# Patient Record
Sex: Male | Born: 1945 | Race: Black or African American | Hispanic: No | Marital: Married | Smoking: Former smoker
Health system: Southern US, Community
[De-identification: ages and names within clinical notes are randomized; demographics above are authoritative.]

## PROBLEM LIST (undated history)

## (undated) DIAGNOSIS — M542 Cervicalgia: Secondary | ICD-10-CM

## (undated) DIAGNOSIS — J069 Acute upper respiratory infection, unspecified: Secondary | ICD-10-CM

## (undated) DIAGNOSIS — R002 Palpitations: Secondary | ICD-10-CM

## (undated) DIAGNOSIS — R079 Chest pain, unspecified: Secondary | ICD-10-CM

## (undated) DIAGNOSIS — E7800 Pure hypercholesterolemia, unspecified: Secondary | ICD-10-CM

## (undated) DIAGNOSIS — Z86010 Personal history of colonic polyps: Secondary | ICD-10-CM

## (undated) DIAGNOSIS — T7840XA Allergy, unspecified, initial encounter: Secondary | ICD-10-CM

## (undated) DIAGNOSIS — E785 Hyperlipidemia, unspecified: Secondary | ICD-10-CM

## (undated) DIAGNOSIS — R0609 Other forms of dyspnea: Secondary | ICD-10-CM

## (undated) DIAGNOSIS — K222 Esophageal obstruction: Secondary | ICD-10-CM

## (undated) DIAGNOSIS — E739 Lactose intolerance, unspecified: Secondary | ICD-10-CM

## (undated) DIAGNOSIS — F528 Other sexual dysfunction not due to a substance or known physiological condition: Secondary | ICD-10-CM

## (undated) DIAGNOSIS — R51 Headache: Secondary | ICD-10-CM

## (undated) DIAGNOSIS — K219 Gastro-esophageal reflux disease without esophagitis: Secondary | ICD-10-CM

## (undated) DIAGNOSIS — K209 Esophagitis, unspecified: Secondary | ICD-10-CM

## (undated) DIAGNOSIS — E669 Obesity, unspecified: Secondary | ICD-10-CM

## (undated) DIAGNOSIS — I6529 Occlusion and stenosis of unspecified carotid artery: Secondary | ICD-10-CM

## (undated) DIAGNOSIS — I739 Peripheral vascular disease, unspecified: Secondary | ICD-10-CM

## (undated) DIAGNOSIS — M545 Low back pain: Secondary | ICD-10-CM

## (undated) DIAGNOSIS — F411 Generalized anxiety disorder: Secondary | ICD-10-CM

## (undated) DIAGNOSIS — R0989 Other specified symptoms and signs involving the circulatory and respiratory systems: Secondary | ICD-10-CM

## (undated) DIAGNOSIS — K648 Other hemorrhoids: Secondary | ICD-10-CM

## (undated) DIAGNOSIS — M1990 Unspecified osteoarthritis, unspecified site: Secondary | ICD-10-CM

## (undated) DIAGNOSIS — H269 Unspecified cataract: Secondary | ICD-10-CM

## (undated) DIAGNOSIS — R42 Dizziness and giddiness: Secondary | ICD-10-CM

## (undated) DIAGNOSIS — E291 Testicular hypofunction: Secondary | ICD-10-CM

## (undated) DIAGNOSIS — I10 Essential (primary) hypertension: Secondary | ICD-10-CM

## (undated) DIAGNOSIS — R9409 Abnormal results of other function studies of central nervous system: Secondary | ICD-10-CM

## (undated) DIAGNOSIS — F329 Major depressive disorder, single episode, unspecified: Secondary | ICD-10-CM

## (undated) DIAGNOSIS — L301 Dyshidrosis [pompholyx]: Secondary | ICD-10-CM

## (undated) DIAGNOSIS — R7302 Impaired glucose tolerance (oral): Secondary | ICD-10-CM

## (undated) DIAGNOSIS — J309 Allergic rhinitis, unspecified: Secondary | ICD-10-CM

## (undated) DIAGNOSIS — R21 Rash and other nonspecific skin eruption: Secondary | ICD-10-CM

## (undated) DIAGNOSIS — J449 Chronic obstructive pulmonary disease, unspecified: Secondary | ICD-10-CM

## (undated) DIAGNOSIS — H6690 Otitis media, unspecified, unspecified ear: Secondary | ICD-10-CM

## (undated) HISTORY — DX: Allergic rhinitis, unspecified: J30.9

## (undated) HISTORY — DX: Chronic obstructive pulmonary disease, unspecified: J44.9

## (undated) HISTORY — DX: Hyperlipidemia, unspecified: E78.5

## (undated) HISTORY — DX: Other hemorrhoids: K64.8

## (undated) HISTORY — DX: Unspecified osteoarthritis, unspecified site: M19.90

## (undated) HISTORY — DX: Impaired glucose tolerance (oral): R73.02

## (undated) HISTORY — DX: Testicular hypofunction: E29.1

## (undated) HISTORY — DX: Other sexual dysfunction not due to a substance or known physiological condition: F52.8

## (undated) HISTORY — DX: Generalized anxiety disorder: F41.1

## (undated) HISTORY — DX: Cervicalgia: M54.2

## (undated) HISTORY — DX: Chest pain, unspecified: R07.9

## (undated) HISTORY — DX: Major depressive disorder, single episode, unspecified: F32.9

## (undated) HISTORY — DX: Peripheral vascular disease, unspecified: I73.9

## (undated) HISTORY — DX: Dizziness and giddiness: R42

## (undated) HISTORY — DX: Allergy, unspecified, initial encounter: T78.40XA

## (undated) HISTORY — DX: Acute upper respiratory infection, unspecified: J06.9

## (undated) HISTORY — DX: Low back pain: M54.5

## (undated) HISTORY — DX: Lactose intolerance, unspecified: E73.9

## (undated) HISTORY — DX: Essential (primary) hypertension: I10

## (undated) HISTORY — DX: Otitis media, unspecified, unspecified ear: H66.90

## (undated) HISTORY — DX: Headache: R51

## (undated) HISTORY — DX: Pure hypercholesterolemia, unspecified: E78.00

## (undated) HISTORY — DX: Occlusion and stenosis of unspecified carotid artery: I65.29

## (undated) HISTORY — DX: Personal history of colonic polyps: Z86.010

## (undated) HISTORY — DX: Obesity, unspecified: E66.9

## (undated) HISTORY — DX: Unspecified cataract: H26.9

## (undated) HISTORY — DX: Abnormal results of other function studies of central nervous system: R94.09

## (undated) HISTORY — DX: Esophagitis, unspecified: K20.9

## (undated) HISTORY — DX: Rash and other nonspecific skin eruption: R21

## (undated) HISTORY — DX: Other specified symptoms and signs involving the circulatory and respiratory systems: R09.89

## (undated) HISTORY — DX: Dyshidrosis (pompholyx): L30.1

## (undated) HISTORY — PX: UPPER GASTROINTESTINAL ENDOSCOPY: SHX188

## (undated) HISTORY — DX: Other forms of dyspnea: R06.09

## (undated) HISTORY — DX: Palpitations: R00.2

## (undated) HISTORY — DX: Gastro-esophageal reflux disease without esophagitis: K21.9

## (undated) HISTORY — PX: OTHER SURGICAL HISTORY: SHX169

## (undated) HISTORY — DX: Esophageal obstruction: K22.2

---

## 1999-07-27 ENCOUNTER — Emergency Department (HOSPITAL_COMMUNITY): Admission: EM | Admit: 1999-07-27 | Discharge: 1999-07-27 | Payer: Self-pay | Admitting: Emergency Medicine

## 2004-01-01 ENCOUNTER — Emergency Department (HOSPITAL_COMMUNITY): Admission: EM | Admit: 2004-01-01 | Discharge: 2004-01-01 | Payer: Self-pay | Admitting: Emergency Medicine

## 2004-07-30 ENCOUNTER — Ambulatory Visit: Payer: Self-pay | Admitting: Internal Medicine

## 2004-08-06 ENCOUNTER — Ambulatory Visit: Payer: Self-pay | Admitting: Internal Medicine

## 2004-10-07 ENCOUNTER — Ambulatory Visit: Payer: Self-pay | Admitting: Internal Medicine

## 2004-10-21 ENCOUNTER — Ambulatory Visit: Payer: Self-pay | Admitting: Internal Medicine

## 2005-08-26 ENCOUNTER — Ambulatory Visit: Payer: Self-pay | Admitting: Internal Medicine

## 2005-09-01 ENCOUNTER — Ambulatory Visit: Payer: Self-pay | Admitting: Internal Medicine

## 2006-09-01 ENCOUNTER — Ambulatory Visit: Payer: Self-pay | Admitting: Internal Medicine

## 2006-09-01 LAB — CONVERTED CEMR LAB
BUN: 11 mg/dL (ref 6–23)
Basophils Absolute: 0 10*3/uL (ref 0.0–0.1)
Bilirubin Urine: NEGATIVE
CO2: 30 meq/L (ref 19–32)
Eosinophils Absolute: 0 10*3/uL (ref 0.0–0.6)
Eosinophils Relative: 0.8 % (ref 0.0–5.0)
GFR calc non Af Amer: 92 mL/min
HCT: 38.8 % — ABNORMAL LOW (ref 39.0–52.0)
Hemoglobin, Urine: NEGATIVE
Ketones, ur: NEGATIVE mg/dL
LDL Cholesterol: 109 mg/dL — ABNORMAL HIGH (ref 0–99)
MCHC: 33.8 g/dL (ref 30.0–36.0)
Monocytes Absolute: 0.4 10*3/uL (ref 0.2–0.7)
Neutro Abs: 3.7 10*3/uL (ref 1.4–7.7)
Neutrophils Relative %: 65.3 % (ref 43.0–77.0)
PSA: 0.16 ng/mL
PSA: 0.16 ng/mL (ref 0.10–4.00)
RDW: 13.4 % (ref 11.5–14.6)
Sodium: 143 meq/L (ref 135–145)
Specific Gravity, Urine: 1.02 (ref 1.000–1.03)
Total Bilirubin: 1.3 mg/dL — ABNORMAL HIGH (ref 0.3–1.2)
Total CHOL/HDL Ratio: 3.4
Urine Glucose: NEGATIVE mg/dL
Urobilinogen, UA: 0.2 (ref 0.0–1.0)
VLDL: 22 mg/dL (ref 0–40)

## 2006-09-09 ENCOUNTER — Ambulatory Visit: Payer: Self-pay | Admitting: Internal Medicine

## 2006-12-08 ENCOUNTER — Ambulatory Visit: Payer: Self-pay | Admitting: Internal Medicine

## 2006-12-08 LAB — CONVERTED CEMR LAB
Bilirubin Urine: NEGATIVE
Hemoglobin, Urine: NEGATIVE
Nitrite: NEGATIVE
Specific Gravity, Urine: 1.025 (ref 1.000–1.03)
Urine Glucose: NEGATIVE mg/dL

## 2006-12-20 ENCOUNTER — Ambulatory Visit: Payer: Self-pay | Admitting: Internal Medicine

## 2007-03-24 ENCOUNTER — Encounter: Payer: Self-pay | Admitting: *Deleted

## 2007-03-24 DIAGNOSIS — K209 Esophagitis, unspecified without bleeding: Secondary | ICD-10-CM | POA: Insufficient documentation

## 2007-03-24 DIAGNOSIS — K219 Gastro-esophageal reflux disease without esophagitis: Secondary | ICD-10-CM | POA: Insufficient documentation

## 2007-03-24 DIAGNOSIS — E7800 Pure hypercholesterolemia, unspecified: Secondary | ICD-10-CM

## 2007-03-24 DIAGNOSIS — K648 Other hemorrhoids: Secondary | ICD-10-CM | POA: Insufficient documentation

## 2007-03-24 DIAGNOSIS — T7840XA Allergy, unspecified, initial encounter: Secondary | ICD-10-CM | POA: Insufficient documentation

## 2007-03-24 DIAGNOSIS — E669 Obesity, unspecified: Secondary | ICD-10-CM

## 2007-03-24 HISTORY — DX: Gastro-esophageal reflux disease without esophagitis: K21.9

## 2007-03-24 HISTORY — DX: Esophagitis, unspecified without bleeding: K20.90

## 2007-03-24 HISTORY — DX: Pure hypercholesterolemia, unspecified: E78.00

## 2007-03-24 HISTORY — DX: Other hemorrhoids: K64.8

## 2007-03-24 HISTORY — DX: Obesity, unspecified: E66.9

## 2007-03-24 HISTORY — DX: Allergy, unspecified, initial encounter: T78.40XA

## 2007-03-28 ENCOUNTER — Encounter: Payer: Self-pay | Admitting: Internal Medicine

## 2007-03-28 DIAGNOSIS — E785 Hyperlipidemia, unspecified: Secondary | ICD-10-CM | POA: Insufficient documentation

## 2007-03-28 DIAGNOSIS — M545 Low back pain, unspecified: Secondary | ICD-10-CM | POA: Insufficient documentation

## 2007-03-28 DIAGNOSIS — F528 Other sexual dysfunction not due to a substance or known physiological condition: Secondary | ICD-10-CM

## 2007-03-28 DIAGNOSIS — F411 Generalized anxiety disorder: Secondary | ICD-10-CM

## 2007-03-28 DIAGNOSIS — I10 Essential (primary) hypertension: Secondary | ICD-10-CM

## 2007-03-28 HISTORY — DX: Essential (primary) hypertension: I10

## 2007-03-28 HISTORY — DX: Low back pain, unspecified: M54.50

## 2007-03-28 HISTORY — DX: Generalized anxiety disorder: F41.1

## 2007-03-28 HISTORY — DX: Other sexual dysfunction not due to a substance or known physiological condition: F52.8

## 2007-09-08 ENCOUNTER — Ambulatory Visit: Payer: Self-pay | Admitting: Internal Medicine

## 2007-09-08 LAB — CONVERTED CEMR LAB
AST: 29 units/L (ref 0–37)
Alkaline Phosphatase: 76 units/L (ref 39–117)
Bilirubin, Direct: 0.1 mg/dL (ref 0.0–0.3)
Creatinine, Ser: 1 mg/dL (ref 0.4–1.5)
Eosinophils Absolute: 0 10*3/uL (ref 0.0–0.6)
GFR calc Af Amer: 98 mL/min
Glucose, Bld: 99 mg/dL (ref 70–99)
HCT: 41.2 % (ref 39.0–52.0)
HDL: 38.1 mg/dL — ABNORMAL LOW (ref >39.0)
Ketones, ur: NEGATIVE mg/dL
LDL Cholesterol: 108 mg/dL — ABNORMAL HIGH (ref 0–99)
Monocytes Absolute: 0.4 10*3/uL (ref 0.2–0.7)
Neutro Abs: 2.7 10*3/uL (ref 1.4–7.7)
Neutrophils Relative %: 56.7 % (ref 43.0–77.0)
PSA: 0.14 ng/mL (ref 0.10–4.00)
Potassium: 3.9 meq/L (ref 3.5–5.1)
RDW: 12.9 % (ref 11.5–14.6)
Specific Gravity, Urine: 1.02 (ref 1.000–1.03)
TSH: 1.84 microintl units/mL (ref 0.35–5.50)
Total Bilirubin: 0.8 mg/dL (ref 0.3–1.2)
Total Protein: 6.4 g/dL (ref 6.0–8.3)
Triglycerides: 95 mg/dL (ref 0–149)
Urine Glucose: NEGATIVE mg/dL
Urobilinogen, UA: 0.2 (ref 0.0–1.0)
VLDL: 19 mg/dL (ref 0–40)
pH: 6 (ref 5.0–8.0)

## 2007-09-15 ENCOUNTER — Encounter: Payer: Self-pay | Admitting: Internal Medicine

## 2007-09-16 ENCOUNTER — Encounter: Payer: Self-pay | Admitting: Internal Medicine

## 2007-09-16 ENCOUNTER — Ambulatory Visit: Payer: Self-pay | Admitting: Internal Medicine

## 2007-09-16 DIAGNOSIS — Z860100 Personal history of colon polyps, unspecified: Secondary | ICD-10-CM | POA: Insufficient documentation

## 2007-09-16 DIAGNOSIS — R079 Chest pain, unspecified: Secondary | ICD-10-CM

## 2007-09-16 DIAGNOSIS — E739 Lactose intolerance, unspecified: Secondary | ICD-10-CM

## 2007-09-16 DIAGNOSIS — R0609 Other forms of dyspnea: Secondary | ICD-10-CM

## 2007-09-16 DIAGNOSIS — J309 Allergic rhinitis, unspecified: Secondary | ICD-10-CM

## 2007-09-16 DIAGNOSIS — E785 Hyperlipidemia, unspecified: Secondary | ICD-10-CM

## 2007-09-16 DIAGNOSIS — K222 Esophageal obstruction: Secondary | ICD-10-CM

## 2007-09-16 DIAGNOSIS — R0989 Other specified symptoms and signs involving the circulatory and respiratory systems: Secondary | ICD-10-CM

## 2007-09-16 HISTORY — DX: Chest pain, unspecified: R07.9

## 2007-09-16 HISTORY — DX: Lactose intolerance, unspecified: E73.9

## 2007-09-16 HISTORY — DX: Esophageal obstruction: K22.2

## 2007-09-16 HISTORY — DX: Allergic rhinitis, unspecified: J30.9

## 2007-09-16 HISTORY — DX: Hyperlipidemia, unspecified: E78.5

## 2007-09-16 HISTORY — DX: Personal history of colonic polyps: Z86.010

## 2007-09-16 HISTORY — DX: Other specified symptoms and signs involving the circulatory and respiratory systems: R06.09

## 2007-09-16 HISTORY — DX: Personal history of colon polyps, unspecified: Z86.0100

## 2007-09-23 ENCOUNTER — Ambulatory Visit: Payer: Self-pay

## 2007-09-23 ENCOUNTER — Encounter: Payer: Self-pay | Admitting: Internal Medicine

## 2007-10-17 ENCOUNTER — Telehealth: Payer: Self-pay | Admitting: Internal Medicine

## 2008-03-30 ENCOUNTER — Ambulatory Visit: Payer: Self-pay | Admitting: Internal Medicine

## 2008-04-03 ENCOUNTER — Telehealth: Payer: Self-pay | Admitting: Internal Medicine

## 2008-04-04 ENCOUNTER — Encounter: Payer: Self-pay | Admitting: Internal Medicine

## 2008-04-12 ENCOUNTER — Telehealth: Payer: Self-pay | Admitting: Internal Medicine

## 2008-04-13 ENCOUNTER — Ambulatory Visit: Payer: Self-pay | Admitting: Internal Medicine

## 2008-04-13 ENCOUNTER — Encounter: Payer: Self-pay | Admitting: Internal Medicine

## 2008-04-16 ENCOUNTER — Encounter: Payer: Self-pay | Admitting: Internal Medicine

## 2008-09-25 ENCOUNTER — Ambulatory Visit: Payer: Self-pay | Admitting: Internal Medicine

## 2008-09-25 LAB — CONVERTED CEMR LAB
AST: 29 units/L (ref 0–37)
Albumin: 3.9 g/dL (ref 3.5–5.2)
Alkaline Phosphatase: 78 units/L (ref 39–117)
Basophils Relative: 0.6 % (ref 0.0–3.0)
Bilirubin Urine: NEGATIVE
CO2: 26 meq/L (ref 19–32)
Chloride: 109 meq/L (ref 96–112)
Cholesterol: 168 mg/dL (ref 0–200)
Creatinine, Ser: 0.9 mg/dL (ref 0.4–1.5)
Eosinophils Absolute: 0 10*3/uL (ref 0.0–0.7)
HDL: 48.4 mg/dL (ref >39.00)
Hemoglobin: 14.4 g/dL (ref 13.0–17.0)
Ketones, ur: NEGATIVE mg/dL
LDL Cholesterol: 105 mg/dL — ABNORMAL HIGH (ref 0–99)
Leukocytes, UA: NEGATIVE
Lymphocytes Relative: 31.3 % (ref 12.0–46.0)
Monocytes Relative: 7.2 % (ref 3.0–12.0)
Nitrite: NEGATIVE
PSA: 0.14 ng/mL (ref 0.10–4.00)
Platelets: 205 10*3/uL (ref 150.0–400.0)
Sodium: 142 meq/L (ref 135–145)
TSH: 2.06 microintl units/mL (ref 0.35–5.50)
Total Protein, Urine: NEGATIVE mg/dL
VLDL: 14.2 mg/dL (ref 0.0–40.0)
pH: 6 (ref 5.0–8.0)

## 2008-09-27 ENCOUNTER — Telehealth: Payer: Self-pay | Admitting: Internal Medicine

## 2008-10-03 ENCOUNTER — Ambulatory Visit: Payer: Self-pay | Admitting: Internal Medicine

## 2008-10-05 ENCOUNTER — Telehealth (INDEPENDENT_AMBULATORY_CARE_PROVIDER_SITE_OTHER): Payer: Self-pay | Admitting: *Deleted

## 2009-06-08 ENCOUNTER — Ambulatory Visit: Payer: Self-pay | Admitting: Internal Medicine

## 2009-06-08 DIAGNOSIS — R519 Headache, unspecified: Secondary | ICD-10-CM | POA: Insufficient documentation

## 2009-06-08 DIAGNOSIS — R51 Headache: Secondary | ICD-10-CM

## 2009-06-08 DIAGNOSIS — R42 Dizziness and giddiness: Secondary | ICD-10-CM

## 2009-06-08 HISTORY — DX: Dizziness and giddiness: R42

## 2009-06-08 HISTORY — DX: Headache: R51

## 2009-06-08 LAB — CONVERTED CEMR LAB: Glucose, Bld: 101 mg/dL

## 2009-06-10 ENCOUNTER — Ambulatory Visit: Payer: Self-pay | Admitting: Internal Medicine

## 2009-06-10 DIAGNOSIS — H6690 Otitis media, unspecified, unspecified ear: Secondary | ICD-10-CM | POA: Insufficient documentation

## 2009-06-10 HISTORY — DX: Otitis media, unspecified, unspecified ear: H66.90

## 2009-06-17 ENCOUNTER — Ambulatory Visit: Payer: Self-pay | Admitting: Internal Medicine

## 2009-06-17 DIAGNOSIS — R21 Rash and other nonspecific skin eruption: Secondary | ICD-10-CM

## 2009-06-17 HISTORY — DX: Rash and other nonspecific skin eruption: R21

## 2009-06-24 ENCOUNTER — Telehealth: Payer: Self-pay | Admitting: Internal Medicine

## 2009-06-24 ENCOUNTER — Ambulatory Visit (HOSPITAL_COMMUNITY): Admission: RE | Admit: 2009-06-24 | Discharge: 2009-06-24 | Payer: Self-pay | Admitting: Internal Medicine

## 2009-06-25 ENCOUNTER — Encounter: Payer: Self-pay | Admitting: Internal Medicine

## 2009-06-25 ENCOUNTER — Telehealth: Payer: Self-pay | Admitting: Internal Medicine

## 2009-06-25 DIAGNOSIS — I739 Peripheral vascular disease, unspecified: Secondary | ICD-10-CM

## 2009-06-25 HISTORY — DX: Peripheral vascular disease, unspecified: I73.9

## 2009-06-26 ENCOUNTER — Ambulatory Visit: Payer: Self-pay

## 2009-06-26 ENCOUNTER — Ambulatory Visit: Payer: Self-pay | Admitting: Internal Medicine

## 2009-06-26 DIAGNOSIS — R9409 Abnormal results of other function studies of central nervous system: Secondary | ICD-10-CM

## 2009-06-26 HISTORY — DX: Abnormal results of other function studies of central nervous system: R94.09

## 2009-06-28 ENCOUNTER — Telehealth: Payer: Self-pay | Admitting: Internal Medicine

## 2009-07-01 ENCOUNTER — Encounter: Payer: Self-pay | Admitting: Internal Medicine

## 2009-07-01 DIAGNOSIS — I6529 Occlusion and stenosis of unspecified carotid artery: Secondary | ICD-10-CM | POA: Insufficient documentation

## 2009-07-01 HISTORY — DX: Occlusion and stenosis of unspecified carotid artery: I65.29

## 2009-07-09 ENCOUNTER — Telehealth: Payer: Self-pay | Admitting: Internal Medicine

## 2009-07-10 ENCOUNTER — Ambulatory Visit: Payer: Self-pay | Admitting: Internal Medicine

## 2009-07-30 ENCOUNTER — Ambulatory Visit (HOSPITAL_COMMUNITY): Admission: RE | Admit: 2009-07-30 | Discharge: 2009-07-30 | Payer: Self-pay | Admitting: Internal Medicine

## 2009-07-30 ENCOUNTER — Ambulatory Visit: Payer: Self-pay | Admitting: Cardiology

## 2009-07-30 ENCOUNTER — Ambulatory Visit: Payer: Self-pay | Admitting: Internal Medicine

## 2009-07-30 ENCOUNTER — Encounter: Payer: Self-pay | Admitting: Internal Medicine

## 2009-07-30 ENCOUNTER — Ambulatory Visit: Payer: Self-pay

## 2009-08-16 ENCOUNTER — Telehealth: Payer: Self-pay | Admitting: Internal Medicine

## 2009-08-16 ENCOUNTER — Ambulatory Visit: Payer: Self-pay | Admitting: Internal Medicine

## 2009-08-16 DIAGNOSIS — J069 Acute upper respiratory infection, unspecified: Secondary | ICD-10-CM

## 2009-08-16 HISTORY — DX: Acute upper respiratory infection, unspecified: J06.9

## 2009-08-20 ENCOUNTER — Telehealth: Payer: Self-pay | Admitting: Internal Medicine

## 2009-09-03 ENCOUNTER — Encounter: Payer: Self-pay | Admitting: Internal Medicine

## 2009-09-30 ENCOUNTER — Ambulatory Visit: Payer: Self-pay | Admitting: Internal Medicine

## 2009-09-30 LAB — CONVERTED CEMR LAB
ALT: 48 units/L (ref 0–53)
Alkaline Phosphatase: 80 units/L (ref 39–117)
Basophils Relative: 0.2 % (ref 0.0–3.0)
CO2: 28 meq/L (ref 19–32)
Chloride: 106 meq/L (ref 96–112)
Creatinine, Ser: 1 mg/dL (ref 0.4–1.5)
Eosinophils Relative: 0.5 % (ref 0.0–5.0)
HCT: 41.5 % (ref 39.0–52.0)
HDL: 55.1 mg/dL (ref >39.00)
Hemoglobin: 14.1 g/dL (ref 13.0–17.0)
Ketones, ur: NEGATIVE mg/dL
Leukocytes, UA: NEGATIVE
MCV: 92.1 fL (ref 78.0–100.0)
Monocytes Relative: 7.3 % (ref 3.0–12.0)
Neutro Abs: 3.3 10*3/uL (ref 1.4–7.7)
Neutrophils Relative %: 61.8 % (ref 43.0–77.0)
Platelets: 203 10*3/uL (ref 150.0–400.0)
Potassium: 3.9 meq/L (ref 3.5–5.1)
RBC: 4.5 M/uL (ref 4.22–5.81)
Specific Gravity, Urine: 1.025 (ref 1.000–1.030)
TSH: 1.71 microintl units/mL (ref 0.35–5.50)
Triglycerides: 46 mg/dL (ref 0.0–149.0)
Urine Glucose: NEGATIVE mg/dL
pH: 5 (ref 5.0–8.0)

## 2009-10-04 ENCOUNTER — Ambulatory Visit: Payer: Self-pay | Admitting: Internal Medicine

## 2009-10-10 ENCOUNTER — Telehealth: Payer: Self-pay | Admitting: Internal Medicine

## 2009-10-17 ENCOUNTER — Telehealth: Payer: Self-pay | Admitting: Internal Medicine

## 2009-10-17 ENCOUNTER — Encounter: Payer: Self-pay | Admitting: Internal Medicine

## 2010-06-19 ENCOUNTER — Telehealth: Payer: Self-pay | Admitting: Internal Medicine

## 2010-06-19 ENCOUNTER — Emergency Department (HOSPITAL_COMMUNITY)
Admission: EM | Admit: 2010-06-19 | Discharge: 2010-06-19 | Payer: Self-pay | Source: Home / Self Care | Admitting: Emergency Medicine

## 2010-06-20 ENCOUNTER — Ambulatory Visit
Admission: RE | Admit: 2010-06-20 | Discharge: 2010-06-20 | Payer: Self-pay | Source: Home / Self Care | Attending: Internal Medicine | Admitting: Internal Medicine

## 2010-06-20 DIAGNOSIS — M542 Cervicalgia: Secondary | ICD-10-CM | POA: Insufficient documentation

## 2010-06-20 DIAGNOSIS — R002 Palpitations: Secondary | ICD-10-CM

## 2010-06-20 HISTORY — DX: Cervicalgia: M54.2

## 2010-06-20 HISTORY — DX: Palpitations: R00.2

## 2010-06-24 ENCOUNTER — Ambulatory Visit: Admission: RE | Admit: 2010-06-24 | Discharge: 2010-06-24 | Payer: Self-pay | Source: Home / Self Care

## 2010-06-24 ENCOUNTER — Encounter: Payer: Self-pay | Admitting: Internal Medicine

## 2010-06-24 ENCOUNTER — Telehealth: Payer: Self-pay | Admitting: Internal Medicine

## 2010-07-11 ENCOUNTER — Encounter: Payer: Self-pay | Admitting: Internal Medicine

## 2010-07-16 ENCOUNTER — Ambulatory Visit: Admission: RE | Admit: 2010-07-16 | Discharge: 2010-07-16 | Payer: Self-pay | Source: Home / Self Care

## 2010-07-16 ENCOUNTER — Encounter: Payer: Self-pay | Admitting: Internal Medicine

## 2010-07-17 ENCOUNTER — Ambulatory Visit
Admission: RE | Admit: 2010-07-17 | Discharge: 2010-07-17 | Payer: Self-pay | Source: Home / Self Care | Attending: Internal Medicine | Admitting: Internal Medicine

## 2010-07-17 ENCOUNTER — Encounter: Payer: Self-pay | Admitting: Internal Medicine

## 2010-07-22 NOTE — Progress Notes (Signed)
Summary: referral  Phone Note Call from Patient Call back at Work Phone [phone]   Caller: Patient Summary of Call: pt called reporting lightheadedness. pt is requesting referral to ENT Initial call taken by: Margaret Pyle, CMA,  August 20, 2009 9:02 AM  Follow-up for Phone Call        ok - I wil do Follow-up by: Corwin Levins MD,  August 20, 2009 11:09 AM  Additional Follow-up for Phone Call Additional follow up Details #1::        pt informed Additional Follow-up by: Margaret Pyle, CMA,  August 20, 2009 11:13 AM

## 2010-07-22 NOTE — Assessment & Plan Note (Signed)
Summary: LIGHTHEADED/ EAR INFECTION? SCRATCHY THROAT/NWS   Vital Signs:  Patient profile:   65 year old male Height:      69 inches Weight:      240 pounds BMI:     35.57 O2 Sat:      94 % on Room air Temp:     97.1 degrees F oral Pulse rate:   101 / minute BP sitting:   104 / 60  (left arm) Cuff size:   large  Vitals Entered ByMarland Kitchen Zella Ball Ewing (August 16, 2009 4:18 PM)  O2 Flow:  Room air CC: light headed/RE   CC:  light headed/RE.  History of Present Illness: here after seeing dr taylor with need for cardionet being considered;  pt essentially doing quite well without dizziness, untl last evening when had onset mild to mod URI symptoms of headache, ST, sinus congestiona and ear fullness, with feeling hot and cold, and mild  nausea at time of dizziness last PM which has not recurred since;  did have some dizziness again this am, but again not clearly vertiginous or postural Pt denies CP, sob, doe, wheezing, orthopnea, pnd, worsening LE edema, palps, dizziness or syncope Pt denies new neuro symptoms such  facial or extremity weakness .  Has some increased stress recently as a good friend died suddenly last wk.  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) 7)  Dyspnea/shortness of Breath  (ICD-786.09) 8)  Rash-nonvesicular  (ICD-782.1) 9)  Hyperlipidemia  (ICD-272.4) 10)  Intermittent Vertigo  (ICD-780.4) 11)  Otitis Media, Left  (ICD-382.9) 12)  Cephalgia  (ICD-784.0) 13)  Dizziness  (ICD-780.4) 14)  Preventive Health Care  (ICD-V70.0) 15)  Chest Pain  (ICD-786.50) 16)  Hypertension  (ICD-401.9) 17)  Hypercholesterolemia  (ICD-272.0) 18)  Preventive Health Care  (ICD-V70.0) 19)  Colonic Polyps, Hx of  (ICD-V12.72) 20)  Esophageal Stricture  (ICD-530.3) 21)  Glucose Intolerance   (ICD-271.3) 22)  Allergic Rhinitis  (ICD-477.9) 23)  Routine General Medical Exam@health  Care Facl  (ICD-V70.0) 24)  Depression  (ICD-311) 25)  Anxiety  (ICD-300.00) 26)  Low Back Pain  (ICD-724.2) 27)  Erectile Dysfunction  (ICD-302.72) 28)  Hemorrhoids, Internal  (ICD-455.0) 29)  Allergy  (ICD-995.3) 30)  Esophagitis  (ICD-530.10) 31)  Gerd  (ICD-530.81) 32)  Obesity  (ICD-278.00)  Medications Prior to Update: 1)  Omeprazole 20 Mg  Cpdr (Omeprazole) .... 2 By Mouth Once Daily 2)  Lipitor 40 Mg  Tabs (Atorvastatin Calcium) .Marland Kitchen.. 1 By Mouth Once Daily 3)  Losartan Potassium 50 Mg Tabs (Losartan Potassium) .Marland Kitchen.. 1 By Mouth Once Daily 4)  Ecotrin Low Strength 81 Mg  Tbec (Aspirin) .Marland Kitchen.. 1 By Mouth Qd 5)  Diclofenac Sodium 75 Mg Tbec (Diclofenac Sodium) .Marland Kitchen.. 1po Two Times A Day As Needed 6)  Flexeril 5 Mg Tabs (Cyclobenzaprine Hcl) .Marland Kitchen.. 1 By Mouth Three Times A Day As Needed 7)  Nasonex 50 Mcg/act Susp (Mometasone Furoate) .... 2 Spray/side Once Daily 8)  Meclizine Hcl 12.5 Mg Tabs (Meclizine Hcl) .Marland Kitchen.. 1 - 2 By Mouth Q 6 Hrs As Needed  Current Medications (verified): 1)  Omeprazole 20 Mg  Cpdr (Omeprazole) .... 2 By Mouth Once Daily 2)  Lipitor 40 Mg  Tabs (Atorvastatin Calcium) .Marland Kitchen.. 1 By Mouth Once Daily 3)  Lisinopril 10 Mg Tabs (Lisinopril) .Marland KitchenMarland KitchenMarland Kitchen  1po Once Daily 4)  Ecotrin Low Strength 81 Mg  Tbec (Aspirin) .Marland Kitchen.. 1 By Mouth Qd 5)  Diclofenac Sodium 75 Mg Tbec (Diclofenac Sodium) .Marland Kitchen.. 1po Two Times A Day As Needed 6)  Flexeril 5 Mg Tabs (Cyclobenzaprine Hcl) .Marland Kitchen.. 1 By Mouth Three Times A Day As Needed 7)  Nasonex 50 Mcg/act Susp (Mometasone Furoate) .... 2 Spray/side Once Daily 8)  Meclizine Hcl 12.5 Mg Tabs (Meclizine Hcl) .Marland Kitchen.. 1 - 2 By Mouth Q 6 Hrs As Needed 9)  Azithromycin 250 Mg Tabs (Azithromycin) .... 2po Qd For 1 Day, Then 1po Qd For 4days, Then Stop 10)  Cialis 20 Mg Tabs (Tadalafil) .... Use Asd 1 By Mouth Every Other Day As Needed  Allergies (verified): 1)  ! 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 , mild ill  Head:  normocephalic and atraumatic.   Eyes:  vision grossly intact, pupils equal, and pupils round.   Ears:  bilat tm's marked erythema, sinus nontender Nose:  nasal dischargemucosal pallor and mucosal edema.   Mouth:  pharyngeal erythema and fair dentition.   Neck:  supple and cervical lymphadenopathy.   Lungs:  normal respiratory effort and normal breath sounds.   Heart:  normal rate and regular rhythm.   Extremities:  no edema, no erythema  Neurologic:  alert & oriented X3, cranial nerves II-XII intact, and strength normal in all extremities.   Psych:  moderately anxious.     Impression & Recommendations:  Problem # 1:  URI (ICD-465.9)  His updated medication list for this problem includes:    Ecotrin Low Strength 81 Mg Tbec (Aspirin) .Marland Kitchen... 1 by mouth qd    Diclofenac Sodium 75 Mg Tbec (Diclofenac sodium) .Marland Kitchen... 1po two times a day as needed tx with antibx, mucinex as per EMR, f/u any worsening s/s  Problem # 2:  DIZZINESS (ICD-780.4)  His updated medication list for this problem includes:    Meclizine Hcl 12.5 Mg Tabs (Meclizine hcl) .Marland Kitchen... 1 - 2 by  mouth q 6 hrs as needed treat as above, f/u any worsening signs or symptoms , exam bening, suspect related to URI and/or nausea  Problem # 3:  HYPERTENSION (ICD-401.9)  His updated medication list for this problem includes:    Lisinopril 10 Mg Tabs (Lisinopril) .Marland Kitchen... 1po once daily  BP today: 104/60 Prior BP: 168/102 (07/30/2009)  Labs Reviewed: K+: 3.7 (09/25/2008) Creat: : 0.9 (09/25/2008)   Chol: 168 (09/25/2008)   HDL: 48.40 (09/25/2008)   LDL: 105 (09/25/2008)   TG: 71.0 (09/25/2008) BP borderline low; cont meds as is with change back from the losartan 50 to lower dose ace per pt request as it costs less and change did not help symptoms from previous exam  Complete Medication List: 1)  Omeprazole 20 Mg Cpdr (Omeprazole) .... 2 by mouth once daily 2)  Lipitor 40 Mg Tabs (Atorvastatin  calcium) .Marland Kitchen.. 1 by mouth once daily 3)  Lisinopril 10 Mg Tabs (Lisinopril) .Marland Kitchen.. 1po once daily 4)  Ecotrin Low Strength 81 Mg Tbec (Aspirin) .Marland Kitchen.. 1 by mouth qd 5)  Diclofenac Sodium 75 Mg Tbec (Diclofenac sodium) .Marland Kitchen.. 1po two times a day as needed 6)  Flexeril 5 Mg Tabs (Cyclobenzaprine hcl) .Marland Kitchen.. 1 by mouth three times a day as needed 7)  Nasonex 50 Mcg/act Susp (Mometasone furoate) .... 2 spray/side once daily 8)  Meclizine Hcl 12.5 Mg Tabs (Meclizine hcl) .Marland Kitchen.. 1 - 2 by mouth q 6 hrs as needed 9)  Azithromycin 250 Mg Tabs (Azithromycin) .... 2po qd for 1 day, then 1po qd for 4days, then stop 10)  Cialis 20 Mg Tabs (Tadalafil) .... Use asd 1 by mouth every other day as needed  Patient Instructions: 1)  Please take all new medications as prescribed 2)  Continue all previous medications as before this visit , except stop the losartan, and re-start the lisinopril 3)  Please schedule a follow-up appointment as needed. Prescriptions: LISINOPRIL 10 MG TABS (LISINOPRIL) 1po once daily  #90 x 3   Entered and Authorized by:   Corwin Levins MD   Signed by:   Corwin Levins MD on 08/16/2009   Method used:    Print then Give to Patient   RxID:   [id number] CIALIS 20 MG TABS (TADALAFIL) use asd 1 by mouth every other day as needed  #3 x 0   Entered and Authorized by:   Corwin Levins MD   Signed by:   Corwin Levins MD on 08/16/2009   Method used:   Print then Give to Patient   RxID:   [id number] AZITHROMYCIN 250 MG TABS (AZITHROMYCIN) 2po qd for 1 day, then 1po qd for 4days, then stop  #6 x 1   Entered and Authorized by:   Corwin Levins MD   Signed by:   Corwin Levins MD on 08/16/2009   Method used:   Print then Give to Patient   RxID:   [id number]

## 2010-07-22 NOTE — Miscellaneous (Signed)
Summary: Orders Update   Clinical Lists Changes  Problems: Added new problem of PVD (ICD-443.9) Orders: Added new Referral order of Radiology Referral (Radiology) - Signed

## 2010-07-22 NOTE — Progress Notes (Signed)
Summary: ultrasound results  Phone Note Call from Patient Call back at Home Phone [phone] Call back at [phone]   Summary of Call: Patient calling for ultrasound results. I notified the patient that we do not have those results at this time, and will call with MD recommendations when rec'd. Initial call taken by: Lucious Groves,  June 28, 2009 4:33 PM  Follow-up for Phone Call        noted Follow-up by: Corwin Levins MD,  June 28, 2009 4:49 PM

## 2010-07-22 NOTE — Miscellaneous (Signed)
Summary: Orders Update  Clinical Lists Changes  Orders: Added new Test order of Carotid Duplex (Carotid Duplex) - Signed 

## 2010-07-22 NOTE — Progress Notes (Signed)
Summary: ELEVATED BP  Phone Note Call from Patient   Summary of Call: Pt c/o "another dizzy spell" today. He checked his bp after, it was 186/105, 166/106 then 167/99. No chest pain, sob or other symptoms. He will go to ER if symptoms return or become worse. Pt is scheduled for office visit tomorrow.  Initial call taken by: Lamar Sprinkles, CMA,  July 09, 2009 4:23 PM  Follow-up for Phone Call        noted Follow-up by: Corwin Levins MD,  July 09, 2009 4:35 PM

## 2010-07-22 NOTE — Assessment & Plan Note (Signed)
Summary: Elevated bp / SD   Vital Signs:  Patient profile:   65 year old male Height:      69.5 inches (176.53 cm) Weight:      238.13 pounds (108.24 kg) O2 Sat:      95 % on Room air Temp:     96.9 degrees F (36.06 degrees C) oral Pulse rate:   100 / minute BP sitting:   124 / 78  (left arm) Cuff size:   large  Vitals Entered By: Josph Macho CMA (July 10, 2009 4:40 PM)  O2 Flow:  Room air CC: Elevated blood pressure/ pt states yesterday at noon BP was 186/105- last night BP 167/97/ CF   CC:  Elevated blood pressure/ pt states yesterday at noon BP was 186/105- last night BP 167/97/ CF.  History of Present Illness: here with recurrent symtpoms of dizziness recurring in the past several months, more freq and severe, he is uncomfortable with driving;  no HA, fever, ST, cough, wheezing, Pt denies CP, sob, doe, wheezing, orthopnea, pnd, worsening LE edema, palps, dizziness or syncope   Pt denies new neuro symptoms such as headache, facial or extremity weakness .  He just cant seem to be more specific about whether he has vertigo type dizzy or lightheaded.  Overall good compliacne with meds, BP seems elevated at home with wrist cuff, but only tends to take it after a dizzy spell.    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) 6)  Rash-nonvesicular  (ICD-782.1) 7)  Chest Pain  (ICD-786.50) 8)  Intermittent Vertigo  (ICD-780.4) 9)  Otitis Media, Left  (ICD-382.9) 10)  Cephalgia  (ICD-784.0) 11)  Dizziness  (ICD-780.4) 12)  Preventive Health Care  (ICD-V70.0) 13)  Dyspnea/shortness of Breath  (ICD-786.09) 14)  Chest Pain  (ICD-786.50) 15)  Preventive Health Care  (ICD-V70.0) 16)  Colonic Polyps, Hx of  (ICD-V12.72) 17)  Hyperlipidemia  (ICD-272.4) 18)  Esophageal Stricture  (ICD-530.3) 19)  Glucose  Intolerance  (ICD-271.3) 20)  Allergic Rhinitis  (ICD-477.9) 21)  Routine General Medical Exam@health  Care Facl  (ICD-V70.0) 22)  Hypertension  (ICD-401.9) 23)  Depression  (ICD-311) 24)  Anxiety  (ICD-300.00) 25)  Low Back Pain  (ICD-724.2) 26)  Erectile Dysfunction  (ICD-302.72) 27)  Hemorrhoids, Internal  (ICD-455.0) 28)  Allergy  (ICD-995.3) 29)  Hypercholesterolemia  (ICD-272.0) 30)  Esophagitis  (ICD-530.10) 31)  Gerd  (ICD-530.81) 32)  Obesity  (ICD-278.00)  Medications Prior to Update: 1)  Omeprazole 20 Mg  Cpdr (Omeprazole) .... 2 By Mouth Once Daily 2)  Lipitor 40 Mg  Tabs (Atorvastatin Calcium) .Marland Kitchen.. 1 By Mouth Once Daily 3)  Losartan Potassium 50 Mg Tabs (Losartan Potassium) .Marland Kitchen.. 1 By Mouth Once Daily 4)  Ecotrin Low Strength 81 Mg  Tbec (Aspirin) .Marland Kitchen.. 1 By Mouth Qd 5)  Cialis 5 Mg Tabs (Tadalafil) .Marland Kitchen.. 1po Once Daily 6)  Diclofenac Sodium 75 Mg Tbec (Diclofenac Sodium) .Marland Kitchen.. 1po Two Times A Day As Needed 7)  Flexeril 5 Mg Tabs (Cyclobenzaprine Hcl) .Marland Kitchen.. 1 By Mouth Three Times A Day As Needed 8)  Hydroxyzine Hcl 25 Mg Tabs (Hydroxyzine Hcl) .Marland Kitchen.. 1-2 By Mouth Four Times Per Day As Needed For Itching 9)  Nasonex 50 Mcg/act Susp (Mometasone Furoate) .... 2 Spray/side Once Daily 10)  Fluocinonide 0.05 % Crea (Fluocinonide) .... Use Asd Two  Times A Day As Needed 11)  Azithromycin 250 Mg Tabs (Azithromycin) .... 2po Qd For 1 Day, Then 1po Qd For 4days, Then Stop 12)  Hydrocodone-Homatropine 5-1.5 Mg/50ml Syrp (Hydrocodone-Homatropine) .Marland Kitchen.. 1 Tsp By Mouth Q 6 Hrs As Needed  Current Medications (verified): 1)  Omeprazole 20 Mg  Cpdr (Omeprazole) .... 2 By Mouth Once Daily 2)  Lipitor 40 Mg  Tabs (Atorvastatin Calcium) .Marland Kitchen.. 1 By Mouth Once Daily 3)  Losartan Potassium 50 Mg Tabs (Losartan Potassium) .Marland Kitchen.. 1 By Mouth Once Daily 4)  Ecotrin Low Strength 81 Mg  Tbec (Aspirin) .Marland Kitchen.. 1 By Mouth Qd 5)  Cialis 5 Mg Tabs (Tadalafil) .Marland Kitchen.. 1po Once Daily 6)  Diclofenac Sodium 75 Mg Tbec  (Diclofenac Sodium) .Marland Kitchen.. 1po Two Times A Day As Needed 7)  Flexeril 5 Mg Tabs (Cyclobenzaprine Hcl) .Marland Kitchen.. 1 By Mouth Three Times A Day As Needed 8)  Hydroxyzine Hcl 25 Mg Tabs (Hydroxyzine Hcl) .Marland Kitchen.. 1-2 By Mouth Four Times Per Day As Needed For Itching 9)  Nasonex 50 Mcg/act Susp (Mometasone Furoate) .... 2 Spray/side Once Daily 10)  Fluocinonide 0.05 % Crea (Fluocinonide) .... Use Asd Two Times A Day As Needed 11)  Meclizine Hcl 12.5 Mg Tabs (Meclizine Hcl) .Marland Kitchen.. 1 - 2 By Mouth Q 6 Hrs As Needed  Allergies (verified): 1)  ! 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   Head:  normocephalic and atraumatic.   Eyes:  vision grossly intact, pupils equal, and pupils round.   Ears:  R ear normal and L ear normal.   Nose:  no external deformity and no nasal discharge.   Mouth:  no gingival abnormalities and pharynx pink and moist.   Neck:  supple and no masses.   Lungs:  normal respiratory effort and normal breath sounds.   Heart:  normal rate and regular rhythm.   Msk:  no joint tenderness and no joint swelling.   Extremities:  no edema, no erythema  Neurologic:  alert & oriented X3, cranial nerves II-XII  intact, strength normal in all extremities, and finger-to-nose normal.     Impression & Recommendations:  Problem # 1:  DIZZINESS (ICD-780.4)  hx vague - just cant seem to pin him down on whether this seems more postural or vertigionous - has had MRI, dopplers ,ecg and labs within the past year;  last echo april 2009;  still symptomatic  for unclear reasons - ? autonomic dysfunciton  - will give rx meclizine to try but will also refer to Dr Rosezella [place] - ? tilt table needed  Orders: Cardiology Referral (Cardiology) Echo Referral (Echo)  His updated medication list for this problem includes:    Meclizine Hcl 12.5 Mg Tabs (Meclizine hcl) .Marland Kitchen... 1 - 2 by mouth q 6 hrs as needed  Problem # 2:  HYPERTENSION (ICD-401.9)  His updated medication list for this problem includes:  Losartan Potassium 50 Mg Tabs (Losartan potassium) .Marland Kitchen... 1 by mouth once daily ok to cont meds as is;  I would not feel comfortable increasing meds though he seems to have persistent elev BP by his wrist cuff at home, and our BP are normal;  should bring cuff to next appt to compare  Problem # 3:  PVD (ICD-443.9) d/w pt  - need for med complaicne, BP and lipids control, and f/u carotid dopplers in 1 yr  Problem # 4:  ANXIETY (ICD-300.00)   ? mild to mod, f/u any worsening signs or symptoms , declines tx   Complete Medication List: 1)  Omeprazole 20 Mg Cpdr (Omeprazole) .... 2 by mouth once daily 2)  Lipitor 40 Mg Tabs (Atorvastatin calcium) .Marland Kitchen.. 1 by mouth once daily 3)  Losartan Potassium 50 Mg Tabs (Losartan potassium) .Marland Kitchen.. 1 by mouth once daily 4)  Ecotrin Low Strength 81 Mg Tbec (Aspirin) .Marland Kitchen.. 1 by mouth qd 5)  Cialis 5 Mg Tabs (Tadalafil) .Marland Kitchen.. 1po once daily 6)  Diclofenac Sodium 75 Mg Tbec (Diclofenac sodium) .Marland Kitchen.. 1po two times a day as needed 7)  Flexeril 5 Mg Tabs (Cyclobenzaprine hcl) .Marland Kitchen.. 1 by mouth three times a day as needed 8)  Hydroxyzine Hcl 25 Mg Tabs (Hydroxyzine hcl) .Marland Kitchen.. 1-2 by mouth four  times per day as needed for itching 9)  Nasonex 50 Mcg/act Susp (Mometasone furoate) .... 2 spray/side once daily 10)  Fluocinonide 0.05 % Crea (Fluocinonide) .... Use asd two times a day as needed 11)  Meclizine Hcl 12.5 Mg Tabs (Meclizine hcl) .Marland Kitchen.. 1 - 2 by mouth q 6 hrs as needed  Patient Instructions: 1)  Please take all new medications as prescribed - the meclizine as needed  2)  Continue all previous medications as before this visit  3)  You will be contacted about the referral(s) to: echocardiogram, and cardiology 4)  Please schedule a follow-up appointment in 3 months with CPX labs and: 5)  HbgA1C prior to visit, ICD-9: 790.2 Prescriptions: MECLIZINE HCL 12.5 MG TABS (MECLIZINE HCL) 1 - 2 by mouth q 6 hrs as needed  #50 x 1   Entered and Authorized by:   Corwin Levins MD   Signed by:   Corwin Levins MD on 07/10/2009   Method used:   Print then Give to Patient   RxID:   [id number]

## 2010-07-22 NOTE — Progress Notes (Signed)
Summary: Rx refill?  Phone Note Call from Patient   Caller: Patient [phone] Summary of Call: pt called requesting refill of Setraline 50mg  once daily. Okay to add on med list and refill? Initial call taken by: Margaret Pyle, CMA,  October 10, 2009 1:03 PM  Follow-up for Phone Call        yes, should be fine - I did escript Follow-up by: Corwin Levins MD,  October 10, 2009 1:08 PM  Additional Follow-up for Phone Call Additional follow up Details #1::        pt informed via VM Additional Follow-up by: Margaret Pyle, CMA,  October 10, 2009 1:14 PM    New/Updated Medications: SERTRALINE HCL 50 MG TABS (SERTRALINE HCL) 1 by mouth once daily Prescriptions: SERTRALINE HCL 50 MG TABS (SERTRALINE HCL) 1 by mouth once daily  #90 x 3   Entered and Authorized by:   Corwin Levins MD   Signed by:   Corwin Levins MD on 10/10/2009   Method used:   Electronically to        [facility] (retail)       [address]       [place], [place]  [address]       Ph: [phone]       Fax: [phone]   RxID:   [id number]

## 2010-07-22 NOTE — Progress Notes (Signed)
Summary: MRI report  Phone Note From Other Clinic   Caller: Jamie/ [facility] Request: Talk with Provider Details for Reason: FYI Summary of Call: Want to inform md that they are faxing over MRI report that pt had done today. Has several impressions. Recieved fax and put on md cart to review Initial call taken by: Orlan Leavens,  June 24, 2009 2:54 PM  Follow-up for Phone Call         noted; no cva or other acute noted Follow-up by: Corwin Levins MD,  June 24, 2009 3:32 PM

## 2010-07-22 NOTE — Progress Notes (Signed)
Summary: Omeprazole PA  Phone Note From Pharmacy   Caller: [facility] Call For: Case: [id number]  Details of Request: Not available online. Summary of Call: PA request--Omeprazole. PA completed and faxed, will await insurance company reply. Initial call taken by: Lucious Groves,  October 17, 2009 11:14 AM     Appended Document: Omeprazole PA Approved until 09/2010.

## 2010-07-22 NOTE — Medication Information (Signed)
Summary: Omeprazole Approved/[facility]  Omeprazole Approved/[facility]   Imported By: Sherian Rein 10/22/2009 14:07:25  _____________________________________________________________________  External Attachment:    Type:   Image     Comment:   External Document

## 2010-07-22 NOTE — Medication Information (Signed)
Summary: Prior [employer] for Omeprazole/[facility]  Prior [employer] for Omeprazole/[facility]   Imported By: Sherian Rein 10/21/2009 09:32:28  _____________________________________________________________________  External Attachment:    Type:   Image     Comment:   External Document

## 2010-07-22 NOTE — Assessment & Plan Note (Signed)
Summary: NEP/DIZZINESS   CC:  Dizziness and last episode x 2 weeks ago.  History of Present Illness: Warren Weeks is referred today by Dr. Jonny Ruiz for evaluation of dizziness.  He is a pleasant middle aged man with a strong family h/x of CAD who has developed dizziness about 2 months ago.  The patient has never had frank syncope. He has had bronchitis in the past and developed a rash on Ceftin.  The episodes of dizziness have improved with the last episode occuring 2 weeks ago.  The patient does not feel palpitations but does note that the episodes are sudden in onset raising at least a question of arrhythmia.    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) 6)  Dyspnea/shortness of Breath  (ICD-786.09) 7)  Rash-nonvesicular  (ICD-782.1) 8)  Hyperlipidemia  (ICD-272.4) 9)  Intermittent Vertigo  (ICD-780.4) 10)  Otitis Media, Left  (ICD-382.9) 11)  Cephalgia  (ICD-784.0) 12)  Dizziness  (ICD-780.4) 13)  Preventive Health Care  (ICD-V70.0) 14)  Chest Pain  (ICD-786.50) 15)  Hypertension  (ICD-401.9) 16)  Hypercholesterolemia  (ICD-272.0) 17)  Preventive Health Care  (ICD-V70.0) 18)  Colonic Polyps, Hx of  (ICD-V12.72) 19)  Esophageal Stricture  (ICD-530.3) 20)  Glucose Intolerance  (ICD-271.3) 21)  Allergic Rhinitis  (ICD-477.9) 22)  Routine General Medical Exam@health  Care Facl  (ICD-V70.0) 23)  Depression  (ICD-311) 24)  Anxiety  (ICD-300.00) 25)  Low Back Pain  (ICD-724.2) 26)  Erectile Dysfunction  (ICD-302.72) 27)  Hemorrhoids, Internal  (ICD-455.0) 28)  Allergy  (ICD-995.3) 29)  Esophagitis  (ICD-530.10) 30)  Gerd  (ICD-530.81) 31)  Obesity  (ICD-278.00)  Current Medications (verified): 1)  Omeprazole 20 Mg  Cpdr (Omeprazole) .... 2 By Mouth Once Daily 2)  Lipitor 40 Mg  Tabs (Atorvastatin Calcium) .Marland Kitchen.. 1 By  Mouth Once Daily 3)  Losartan Potassium 50 Mg Tabs (Losartan Potassium) .Marland Kitchen.. 1 By Mouth Once Daily 4)  Ecotrin Low Strength 81 Mg  Tbec (Aspirin) .Marland Kitchen.. 1 By Mouth Qd 5)  Diclofenac Sodium 75 Mg Tbec (Diclofenac Sodium) .Marland Kitchen.. 1po Two Times A Day As Needed 6)  Flexeril 5 Mg Tabs (Cyclobenzaprine Hcl) .Marland Kitchen.. 1 By Mouth Three Times A Day As Needed 7)  Nasonex 50 Mcg/act Susp (Mometasone Furoate) .... 2 Spray/side Once Daily 8)  Meclizine Hcl 12.5 Mg Tabs (Meclizine Hcl) .Marland Kitchen.. 1 - 2 By Mouth Q 6 Hrs As Needed  Allergies: 1)  ! Ceftin  Past History:  Past Medical History: Last updated: 09/16/2007 HEMORRHOIDS, INTERNAL (ICD-455.0) ALLERGY (ICD-995.3) HYPERCHOLESTEROLEMIA (ICD-272.0) ESOPHAGITIS (ICD-530.10) esophageal stricture GERD (ICD-530.81) OBESITY (ICD-278.00) Erectile Dysfunction Low back pain Anxiety Depression Hypertension hx of PHN Allergic rhinitis right knee DJD glucose intolerance Hyperlipidemia Colonic polyps, hx of - hyperplastic 5/06  Past Surgical History: Last updated: 09/16/2007 Tonsillectomy s/p right knee arthroscopy  Family History: Last updated: 09/16/2007 father with heart disease/CHF mother with lymphoma sister with ovary cancer sister with lung cacner  Social History: Last updated: 09/16/2007 Married 2 children Former Smoker 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  There is no c/p or sob.  Vital Signs:  Patient profile:   65 year old male Height:      69.5 inches Weight:  234 pounds BMI:     34.18 Pulse rate:   73 / minute BP sitting:   168 / 102  (left arm)  Vitals Entered By: Stanton Kidney, EMT-P (July 30, 2009 11:27 AM)  Serial Vital Signs/Assessments:  Time      Position  BP       Pulse  Resp  Temp     By 11:33 AM            166/92                         Stanton Kidney, EMT-P  Comments: 11:33 AM (R) Arm- sitting By: Stanton Kidney, EMT-P    Physical Exam  General:  Well developed, well nourished, in no acute distress.  HEENT: normal Neck: supple. No JVD. Carotids 2+ bilaterally no bruits Cor: RRR no rubs, gallops or murmur Lungs: CTA Ab: soft, nontender. nondistended. No HSM. Good bowel sounds Ext: warm. no cyanosis, clubbing or edema Neuro: alert and oriented. Grossly nonfocal. affect pleasant    EKG  Procedure date:  07/30/2009  Findings:      Normal sinus rhythm with rate of:  73.  Impression & Recommendations:  Problem # 1:  DIZZINESS (ICD-780.4) The etiology of his symptoms is unclear.  He has not had frank syncope.  His spells are episodic and this raises a suspcion that a cardiac arrhythmia is the underlying cause though I still think this is unlikely.  If he has more symptoms, then a cardiac monitor would be warranted.  The patient did have a 2D echo today, the results are pending.  If his echo is ok, then I would recommend watchful waiting until he had more symptoms, then consider wearing the cardionet monitor.  Problem # 2:  CHEST PAIN (ICD-786.50) He did not complain of this to me today.  He does have a strong family hx of CAD.  Will await for the development of symptoms. His updated medication list for this problem includes:    Ecotrin Low Strength 81 Mg Tbec (Aspirin) .Marland Kitchen... 1 by mouth qd  Problem # 3:  HYPERCHOLESTEROLEMIA (ICD-272.0) A low fat diet and continued statin therapy is warranted. His updated medication list for this problem includes:    Lipitor 40 Mg Tabs (Atorvastatin calcium) .Marland Kitchen... 1 by mouth once daily  Patient Instructions: 1)  Your physician recommends that you schedule a follow-up appointment in: 3-4 months with Dr Ladona Ridgel

## 2010-07-22 NOTE — Assessment & Plan Note (Signed)
Summary: CPX / NWS  #   Vital Signs:  Patient profile:   65 year old male Height:      69.5 inches Weight:      241.50 pounds BMI:     35.28 O2 Sat:      94 % on Room air Temp:     98.5 degrees F oral Pulse rate:   76 / minute BP sitting:   122 / 80  (left arm) Cuff size:   large  Vitals Entered ByZella Ball Ewing (October 04, 2009 8:39 AM)  O2 Flow:  Room air  CC: Adult Physical/RE   CC:  Adult Physical/RE.  History of Present Illness: trying to do better with diet and excericse, no wt loss yet;  Pt denies CP, sob, doe, wheezing, orthopnea, pnd, worsening LE edema, palps, dizziness or syncope  Pt denies new neuro symptoms such as headache, facial or extremity weakness   Preventive Screening-Counseling & Management      Drug Use:  no.    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) 7)  Dyspnea/shortness of Breath  (ICD-786.09) 8)  Rash-nonvesicular  (ICD-782.1) 9)  Hyperlipidemia  (ICD-272.4) 10)  Intermittent Vertigo  (ICD-780.4) 11)  Otitis Media, Left  (ICD-382.9) 12)  Cephalgia  (ICD-784.0) 13)  Dizziness  (ICD-780.4) 14)  Preventive Health Care  (ICD-V70.0) 15)  Chest Pain  (ICD-786.50) 16)  Hypertension  (ICD-401.9) 17)  Hypercholesterolemia  (ICD-272.0) 18)  Preventive Health Care  (ICD-V70.0) 19)  Colonic Polyps, Hx of  (ICD-V12.72) 20)  Esophageal Stricture  (ICD-530.3) 21)  Glucose Intolerance  (ICD-271.3) 22)  Allergic Rhinitis  (ICD-477.9) 23)  Routine General Medical Exam@health  Care Facl  (ICD-V70.0) 24)  Depression  (ICD-311) 25)  Anxiety  (ICD-300.00) 26)  Low Back Pain  (ICD-724.2) 27)  Erectile Dysfunction  (ICD-302.72) 28)  Hemorrhoids, Internal  (ICD-455.0) 29)  Allergy  (ICD-995.3) 30)  Esophagitis  (ICD-530.10) 31)  Gerd  (ICD-530.81) 32)  Obesity   (ICD-278.00)  Medications Prior to Update: 1)  Omeprazole 20 Mg  Cpdr (Omeprazole) .... 2 By Mouth Once Daily 2)  Lipitor 40 Mg  Tabs (Atorvastatin Calcium) .Marland Kitchen.. 1 By Mouth Once Daily 3)  Lisinopril 10 Mg Tabs (Lisinopril) .Marland Kitchen.. 1po Once Daily 4)  Ecotrin Low Strength 81 Mg  Tbec (Aspirin) .Marland Kitchen.. 1 By Mouth Qd 5)  Diclofenac Sodium 75 Mg Tbec (Diclofenac Sodium) .Marland Kitchen.. 1po Two Times A Day As Needed 6)  Flexeril 5 Mg Tabs (Cyclobenzaprine Hcl) .Marland Kitchen.. 1 By Mouth Three Times A Day As Needed 7)  Nasonex 50 Mcg/act Susp (Mometasone Furoate) .... 2 Spray/side Once Daily 8)  Meclizine Hcl 12.5 Mg Tabs (Meclizine Hcl) .Marland Kitchen.. 1 - 2 By Mouth Q 6 Hrs As Needed 9)  Azithromycin 250 Mg Tabs (Azithromycin) .... 2po Qd For 1 Day, Then 1po Qd For 4days, Then Stop 10)  Cialis 20 Mg Tabs (Tadalafil) .... Use Asd 1 By Mouth Every Other Day As Needed  Current Medications (verified): 1)  Omeprazole 20 Mg  Cpdr (Omeprazole) .... 2 By Mouth Once Daily 2)  Lipitor 40 Mg  Tabs (Atorvastatin Calcium) .Marland Kitchen.. 1 By Mouth Once Daily 3)  Lisinopril 10 Mg Tabs (Lisinopril) .Marland Kitchen.. 1po Once Daily 4)  Ecotrin Low Strength 81 Mg  Tbec (Aspirin) .Marland Kitchen.. 1 By Mouth Qd 5)  Diclofenac Sodium 75 Mg Tbec (Diclofenac Sodium) .Marland Kitchen.. 1po Two Times A Day As Needed 6)  Flexeril 5 Mg Tabs (Cyclobenzaprine Hcl) .Marland Kitchen.. 1 By Mouth Three Times A Day As Needed 7)  Nasonex 50 Mcg/act Susp (Mometasone Furoate) .... 2 Spray/side Once Daily 8)  Meclizine Hcl 12.5 Mg Tabs (Meclizine Hcl) .Marland Kitchen.. 1 - 2 By Mouth Q 6 Hrs As Needed 9)  Sertraline ? Dose 10)  Cialis 20 Mg Tabs (Tadalafil) .... Use Asd 1 By Mouth Every Other Day As Needed  Allergies (verified): 1)  ! 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 father with heart disease/CHF mother with lymphoma sister with ovary cancer sister with lung cacner  Social History: Reviewed history from 09/16/2007 and no changes required. 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         all otherwise negative per pt -    Physical Exam  General:  alert and overweight-appearing.   Head:  normocephalic and atraumatic.   Eyes:  vision grossly intact, pupils equal, and pupils round.   Ears:  R ear normal and L ear normal.   Nose:  no external deformity and no nasal discharge.   Mouth:  no gingival abnormalities and pharynx pink and moist.   Neck:  supple and no masses.   Lungs:  normal respiratory effort and normal breath sounds.   Heart:  normal rate and  regular rhythm.   Abdomen:  soft, non-tender, and normal bowel sounds.   Msk:  no joint tenderness and no joint swelling.   Extremities:  no edema, no erythema  Neurologic:  cranial nerves II-XII intact and strength normal in all extremities.     Impression & Recommendations:  Problem # 1:  Preventive Health Care (ICD-V70.0) Overall doing well, age appropriate education and counseling updated and referral for appropriate preventive services done unless declined, immunizations up to date or declined, diet counseling done if overweight, urged to quit smoking if smokes , most recent labs reviewed and current ordered if appropriate, ecg reviewed or declined (interpretation per ECG scanned in the EMR if done); information regarding Medicare Prevention requirements given if appropriate  Orders: EKG w/ Interpretation (93000)  Problem # 2:  HYPERTENSION (ICD-401.9)  His updated medication list for this problem includes:    Lisinopril 10 Mg Tabs (Lisinopril) .Marland Kitchen... 1po once daily  BP today: 122/80 Prior BP: 104/60 (  08/16/2009)  Labs Reviewed: K+: 3.9 (09/30/2009) Creat: : 1.0 (09/30/2009)   Chol: 168 (09/30/2009)   HDL: 55.10 (09/30/2009)   LDL: 104 (09/30/2009)   TG: 46.0 (09/30/2009) stable overall by hx and exam, ok to continue meds/tx as is   Complete Medication List: 1)  Omeprazole 20 Mg Cpdr (Omeprazole) .... 2 by mouth once daily 2)  Lipitor 40 Mg Tabs (Atorvastatin calcium) .Marland Kitchen.. 1 by mouth once daily 3)  Lisinopril 10 Mg Tabs (Lisinopril) .Marland Kitchen.. 1po once daily 4)  Ecotrin Low Strength 81 Mg Tbec (Aspirin) .Marland Kitchen.. 1 by mouth qd 5)  Diclofenac Sodium 75 Mg Tbec (Diclofenac sodium) .Marland Kitchen.. 1po two times a day as needed 6)  Flexeril 5 Mg Tabs (Cyclobenzaprine hcl) .Marland Kitchen.. 1 by mouth three times a day as needed 7)  Nasonex 50 Mcg/act Susp (Mometasone furoate) .... 2 spray/side once daily 8)  Meclizine Hcl 12.5 Mg Tabs (Meclizine hcl) .Marland Kitchen.. 1 - 2 by mouth q 6 hrs as needed 9)  Sertraline ? Dose  10)   Cialis 20 Mg Tabs (Tadalafil) .... Use asd 1 by mouth every other day as needed  Patient Instructions: 1)  Your EKG was good today 2)  Your Lisinopril was sent to the pharmacy 3)  Continue all previous medications as before this visit  4)  your blood work was faxed to Dr Brunilda Payor 5)  Please schedule a follow-up appointment in 1 year for yearly exam, or sooner if needed Prescriptions: LISINOPRIL 10 MG TABS (LISINOPRIL) 1po once daily  #90 x 3   Entered and Authorized by:   Corwin Levins MD   Signed by:   Corwin Levins MD on 10/04/2009   Method used:   Electronically to        [facility] (retail)       [address]       [place], [place]  [address]       Ph: [phone]       Fax: [phone]   RxID:   [id number]

## 2010-07-22 NOTE — Miscellaneous (Signed)
   Clinical Lists Changes  Problems: Added new problem of CAROTID ARTERY STENOSIS, BILATERAL (ICD-433.10)

## 2010-07-22 NOTE — Progress Notes (Signed)
Summary: referral  Phone Note Call from Patient Call back at Work Phone [phone]   Caller: Patient Summary of Call: pt called stating he has been experiencing lightheadedness and is requesting referral to ENT for possible ear infection. Initial call taken by: Margaret Pyle, CMA,  August 16, 2009 9:59 AM  Follow-up for Phone Call        needs OV, as I cannot tell from this if referral is needed Follow-up by: Corwin Levins MD,  August 16, 2009 12:38 PM  Additional Follow-up for Phone Call Additional follow up Details #1::        Called work number and was notified that the patient is gone to lunch. Additional Follow-up by: Lucious Groves,  August 16, 2009 1:27 PM    Additional Follow-up for Phone Call Additional follow up Details #2::    informed pt co-worker to call office to schedule OV and leave a message on triage if he had any furter questions or concerns Follow-up by: Margaret Pyle, CMA,  August 16, 2009 1:30 PM

## 2010-07-22 NOTE — Assessment & Plan Note (Signed)
Summary: HOARSE/ SEE PHONE NOTE/NWS   Vital Signs:  Patient profile:   65 year old male Height:      69.5 inches Weight:      244 pounds BMI:     35.64 O2 Sat:      94 % on Room air Temp:     98.1 degrees F oral Pulse rate:   82 / minute BP sitting:   130 / 76  (left arm) Cuff size:   regular  Vitals Entered ByZella Ball Ewing (June 26, 2009 11:50 AM)  O2 Flow:  Room air CC: hoarse, hands and feet peeling/RE   CC:  hoarse and hands and feet peeling/RE.  History of Present Illness: here today with several concerns - now with 3 days acute onset midl to mod ST, fever and prod cough with greenish sputum , fortunately without wheezing and Pt denies CP, sob, doe, wheezing, orthopnea, pnd, worsening LE edema, palps, dizziness or syncope   Pt denies new neuro symptoms such as headache, facial or extremity weakness  Also since last seen since the ceftin/hives problem has since developed an exfoliative type palmar and foot rash without itching or pain or swelling but skin flakes and eyrthma persists;  also wtih mild nasal allergy symtpoms and MRI confirms mild max sinus sweling since last visit;  also went over with pt MRI result today with mention of ? right vertebral vasc disease;   no other new complaints.  hive rash i resolved and no further pruritis after finished prednione, but is asking for repeat depo shot due to the rash today.  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) 4)  Rash-nonvesicular  (ICD-782.1) 5)  Chest Pain  (ICD-786.50) 6)  Intermittent Vertigo  (ICD-780.4) 7)  Otitis Media, Left  (ICD-382.9) 8)  Cephalgia  (ICD-784.0) 9)  Dizziness  (ICD-780.4) 10)  Preventive Health Care  (ICD-V70.0) 11)  Dyspnea/shortness of Breath  (ICD-786.09) 12)  Chest Pain  (ICD-786.50) 13)  Preventive Health Care  (ICD-V70.0) 14)  Colonic Polyps, Hx of  (ICD-V12.72) 15)  Hyperlipidemia   (ICD-272.4) 16)  Esophageal Stricture  (ICD-530.3) 17)  Glucose Intolerance  (ICD-271.3) 18)  Allergic Rhinitis  (ICD-477.9) 19)  Routine General Medical Exam@health  [facility]  (ICD-V70.0) 20)  Hypertension  (ICD-401.9) 21)  Depression  (ICD-311) 22)  Anxiety  (ICD-300.00) 23)  Low Back Pain  (ICD-724.2) 24)  Erectile Dysfunction  (ICD-302.72) 25)  Hemorrhoids, Internal  (ICD-455.0) 26)  Allergy  (ICD-995.3) 27)  Hypercholesterolemia  (ICD-272.0) 28)  Esophagitis  (ICD-530.10) 29)  Gerd  (ICD-530.81) 30)  Obesity  (ICD-278.00)  Medications Prior to Update: 1)  Omeprazole 20 Mg  Cpdr (Omeprazole) .... 2 By Mouth Once Daily 2)  Lipitor 40 Mg  Tabs (Atorvastatin Calcium) .Marland Kitchen.. 1 By Mouth Once Daily 3)  Losartan Potassium 50 Mg Tabs (Losartan Potassium) .Marland Kitchen.. 1 By Mouth Once Daily 4)  Ecotrin Low Strength 81 Mg  Tbec (Aspirin) .Marland Kitchen.. 1 By Mouth Qd 5)  Cialis 5 Mg Tabs (Tadalafil) .Marland Kitchen.. 1po Once Daily 6)  Diclofenac Sodium 75 Mg Tbec (Diclofenac Sodium) .Marland Kitchen.. 1po Two Times A Day As Needed 7)  Flexeril 5 Mg Tabs (Cyclobenzaprine Hcl) .Marland Kitchen.. 1 By Mouth Three Times A Day As Needed 8)  Prednisone 10 Mg Tabs (Prednisone) .... 4po Qd For 3days, Then 3po Qd For 3days, Then 2po Qd For 3days, Then 1po Qd For 3 Days, Then Stop  9)  Hydroxyzine Hcl 25 Mg Tabs (Hydroxyzine Hcl) .Marland Kitchen.. 1-2 By Mouth Four Times Per Day As Needed For Itching  Current Medications (verified): 1)  Omeprazole 20 Mg  Cpdr (Omeprazole) .... 2 By Mouth Once Daily 2)  Lipitor 40 Mg  Tabs (Atorvastatin Calcium) .Marland Kitchen.. 1 By Mouth Once Daily 3)  Losartan Potassium 50 Mg Tabs (Losartan Potassium) .Marland Kitchen.. 1 By Mouth Once Daily 4)  Ecotrin Low Strength 81 Mg  Tbec (Aspirin) .Marland Kitchen.. 1 By Mouth Qd 5)  Cialis 5 Mg Tabs (Tadalafil) .Marland Kitchen.. 1po Once Daily 6)  Diclofenac Sodium 75 Mg Tbec (Diclofenac Sodium) .Marland Kitchen.. 1po Two Times A Day As Needed 7)  Flexeril 5 Mg Tabs (Cyclobenzaprine Hcl) .Marland Kitchen.. 1 By Mouth Three Times A Day As Needed 8)  Hydroxyzine Hcl 25 Mg  Tabs (Hydroxyzine Hcl) .Marland Kitchen.. 1-2 By Mouth Four Times Per Day As Needed For Itching 9)  Nasonex 50 Mcg/act Susp (Mometasone Furoate) .... 2 Spray/side Once Daily 10)  Fluocinonide 0.05 % Crea (Fluocinonide) .... Use Asd Two Times A Day As Needed 11)  Azithromycin 250 Mg Tabs (Azithromycin) .... 2po Qd For 1 Day, Then 1po Qd For 4days, Then Stop 12)  Hydrocodone-Homatropine 5-1.5 Mg/5ml Syrp (Hydrocodone-Homatropine) .Marland Kitchen.. 1 Tsp By Mouth Q 6 Hrs As Needed  Allergies (verified): 1)  ! 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  , mild ill  Head:  normocephalic and atraumatic.   Eyes:  vision grossly intact, pupils equal, and pupils round.   Ears:  bilat tm's mild erythema, left more than right, canals ok, sinus nontender Nose:  nasal dischargemucosal pallor and mucosal erythema.   Mouth:  pharyngeal erythema and fair dentition.   Neck:  supple and cervical lymphadenopathy.   Lungs:  normal respiratory effort and normal breath sounds.   Heart:  normal rate and regular rhythm.   Msk:  no joint tenderness and no joint swelling.   Extremities:  no edema, no erythema  Skin:  scaly  rash to palms without tender or  sweling or warmth   Impression & Recommendations:  Problem # 1:  BRONCHITIS-ACUTE (ICD-466.0)  His updated medication list for this problem includes:    Azithromycin 250 Mg Tabs (Azithromycin) .Marland Kitchen... 2po qd for 1 day, then 1po qd for 4days, then stop    Hydrocodone-homatropine 5-1.5 Mg/47ml Syrp (Hydrocodone-homatropine) .Marland Kitchen... 1 tsp by mouth q 6 hrs as needed treat as above, f/u any worsening signs or symptoms   Problem # 2:  RASH-NONVESICULAR (ICD-782.1)  His updated medication list for this problem includes:    Fluocinonide 0.05 % Crea (Fluocinonide) ..... Use asd two times a day as needed for depo shot today (shoudl help allergies as well); treat as above, f/u any worsening signs or symptoms   Orders: Depo- Medrol 40mg  (J1030) Depo- Medrol 80mg  (J1040) Admin of Therapeutic Inj  intramuscular or subcutaneous (16109)  Problem #  3:  ALLERGIC RHINITIS (ICD-477.9)  His updated medication list for this problem includes:    Nasonex 50 Mcg/act Susp (Mometasone furoate) .Marland Kitchen... 2 spray/side once daily treat as above, f/u any worsening signs or symptoms   Problem # 4:  MAGNETIC RESONANCE IMAGING, BRAIN, ABNORMAL (ICD-794.09) with ? right vertebral artery disesae - to check carotid dopplers, consider vasc consult  Complete Medication List: 1)  Omeprazole 20 Mg Cpdr (Omeprazole) .... 2 by mouth once daily 2)  Lipitor 40 Mg Tabs (Atorvastatin calcium) .Marland Kitchen.. 1 by mouth once daily 3)  Losartan Potassium 50 Mg Tabs (Losartan potassium) .Marland Kitchen.. 1 by mouth once daily 4)  Ecotrin Low Strength 81 Mg Tbec (Aspirin) .Marland Kitchen.. 1 by mouth qd 5)  Cialis 5 Mg Tabs (Tadalafil) .Marland Kitchen.. 1po once daily 6)  Diclofenac Sodium 75 Mg Tbec (Diclofenac sodium) .Marland Kitchen.. 1po two times a day as needed 7)  Flexeril 5 Mg Tabs (Cyclobenzaprine hcl) .Marland Kitchen.. 1 by mouth three times a day as needed 8)  Hydroxyzine Hcl 25 Mg Tabs (Hydroxyzine hcl) .Marland Kitchen.. 1-2 by mouth four times per day as needed for itching 9)  Nasonex 50 Mcg/act Susp  (Mometasone furoate) .... 2 spray/side once daily 10)  Fluocinonide 0.05 % Crea (Fluocinonide) .... Use asd two times a day as needed 11)  Azithromycin 250 Mg Tabs (Azithromycin) .... 2po qd for 1 day, then 1po qd for 4days, then stop 12)  Hydrocodone-homatropine 5-1.5 Mg/35ml Syrp (Hydrocodone-homatropine) .Marland Kitchen.. 1 tsp by mouth q 6 hrs as needed  Patient Instructions: 1)  You had the steroid shot today 2)  Please take all new medications as prescribed 3)  Continue all previous medications as before this visit  4)  You will be contacted about the referral(s) to: Carotid dopplers 5)  Please schedule a follow-up appointment as needed. Prescriptions: HYDROCODONE-HOMATROPINE 5-1.5 MG/5ML SYRP (HYDROCODONE-HOMATROPINE) 1 tsp by mouth q 6 hrs as needed  #6 oz x 1   Entered and Authorized by:   Corwin Levins MD   Signed by:   Corwin Levins MD on 06/26/2009   Method used:   Print then Give to Patient   RxID:   [id number] AZITHROMYCIN 250 MG TABS (AZITHROMYCIN) 2po qd for 1 day, then 1po qd for 4days, then stop  #6 x 1   Entered and Authorized by:   Corwin Levins MD   Signed by:   Corwin Levins MD on 06/26/2009   Method used:   Print then Give to Patient   RxID:   [id number] FLUOCINONIDE 0.05 % CREA (FLUOCINONIDE) use asd two times a day as needed  #1 x 1   Entered and Authorized by:   Corwin Levins MD   Signed by:   Corwin Levins MD on 06/26/2009   Method used:   Print then Give to Patient   RxID:   [id number] NASONEX 50 MCG/ACT SUSP (MOMETASONE FUROATE) 2 spray/side once daily  #1 x 11   Entered and Authorized by:   Corwin Levins MD   Signed by:   Corwin Levins MD on 06/26/2009   Method used:   Print then Give to Patient   RxID:   [id number]    Medication Administration  Injection # 1:    Medication: Depo- Medrol 40mg     Diagnosis: RASH-NONVESICULAR (ICD-782.1)    Route: IM    Site: LUOQ gluteus    Exp Date: 03/2010    Lot #: [id number]    Mfr: Teva    Given by:  Robin Ewing (June 26, 2009 12:20 PM)  Injection # 2:    Medication: Depo- Medrol 80mg     Diagnosis: RASH-NONVESICULAR (ICD-782.1)    Route: IM    Site: LUOQ gluteus    Exp Date: 03/2010    Lot #: [id number]    Mfr: TEva    Given by: Zella Ball Ewing (June 26, 2009 12:20 PM)  Orders Added: 1)  Depo- Medrol 40mg  [J1030] 2)  Depo- Medrol 80mg  [J1040] 3)  Admin of Therapeutic Inj  intramuscular or subcutaneous [96372] 4)  Est. Patient Level IV [19147]

## 2010-07-22 NOTE — Consult Note (Signed)
Summary: [facility]  [facility]   Imported By: Lester [place] 09/06/2009 09:25:25  _____________________________________________________________________  External Attachment:    Type:   Image     Comment:   External Document

## 2010-07-22 NOTE — Progress Notes (Signed)
Summary: Losartin side effects  Phone Note Call from Patient Call back at Work Phone [phone]   Summary of Call: Spoke with patient and he is on Losartin for HTN. Patient c/o being dry/scaley and his hands have been peeling for 3 days. Per patient his now is having scratchy throat and congestion, and his feet started peeling today. Lotion and hydrocortisone creams are no help at all. Patient would like to know if he can change meds. Please advise. Initial call taken by: Lucious Groves,  June 25, 2009 5:06 PM  Follow-up for Phone Call        losartan does not have these side effects;  these must be due to other causes;  no need to change losartan Follow-up by: Corwin Levins MD,  June 25, 2009 5:38 PM  Additional Follow-up for Phone Call Additional follow up Details #1::        left message on machine for pt to return my call  Additional Follow-up by: Margaret Pyle, CMA,  June 26, 2009 8:05 AM    Additional Follow-up for Phone Call Additional follow up Details #2::    PATIENT CALLED AND MADE AN APPT WITH DR. Jonny Ruiz FOR TODAY.  HIS VOICE IS HOARSE. Follow-up by: Hilarie Fredrickson,  June 26, 2009 8:23 AM

## 2010-07-24 NOTE — Assessment & Plan Note (Signed)
Summary: palpitations  Medications Added VITAMIN E 600 UNIT  CAPS (VITAMIN E) Take 1 tablet by mouth once a day COLCRYS 0.6 MG TABS (COLCHICINE) as needed      Allergies Added:   Visit Type:  Follow-up Primary Provider:  Dr. Jonny Ruiz  CC:  palpitations and ?edema.  History of Present Illness: Mr.  Warren Weeks returns today for followup. He has a h/o dizziness of unclear etiology and palpitations. He had a prolonged episode of palpitations several weeks ago and went to the ER but his symptoms resolved as he arrived and an ECG demonstrated no arrhythmia. He wore a monitor which demonstrated PAC's, PVC's and NSR.  He returns for followup. He denies c/p or sob. He had a carotid study demonstrating a persistence of bilateral carotid disease with less than 60% narrowing.  Current Medications (verified): 1)  Omeprazole 20 Mg  Cpdr (Omeprazole) .... 2 By Mouth Once Daily 2)  Lipitor 40 Mg  Tabs (Atorvastatin Calcium) .Marland Kitchen.. 1 By Mouth Once Daily 3)  Lisinopril 10 Mg Tabs (Lisinopril) .Marland Kitchen.. 1po Once Daily 4)  Ecotrin Low Strength 81 Mg  Tbec (Aspirin) .Marland Kitchen.. 1 By Mouth Qd 5)  Diclofenac Sodium 75 Mg Tbec (Diclofenac Sodium) .Marland Kitchen.. 1po Two Times A Day As Needed 6)  Flexeril 5 Mg Tabs (Cyclobenzaprine Hcl) .Marland Kitchen.. 1 By Mouth Three Times A Day As Needed 7)  Nasonex 50 Mcg/act Susp (Mometasone Furoate) .... 2 Spray/side Once Daily 8)  Meclizine Hcl 12.5 Mg Tabs (Meclizine Hcl) .Marland Kitchen.. 1 - 2 By Mouth Q 6 Hrs As Needed 9)  Vitamin E 600 Unit  Caps (Vitamin E) .... Take 1 Tablet By Mouth Once A Day 10)  Colcrys 0.6 Mg Tabs (Colchicine) .... As Needed  Allergies (verified): 1)  ! 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    Vital Signs:  Patient profile:   65 year old male Height:      69.5 inches Weight:      232.75 pounds BMI:     34.00 Pulse rate:   93 / minute BP sitting:   118 / 78  (left arm)  Vitals Entered By: Celestia Khat, CMA (July 17, 2010 2:05 PM)  Physical Exam  General:  alert and overweight-appearing.   Head:  normocephalic and atraumatic.   Eyes:  vision grossly intact, pupils equal, and pupils round.   Mouth:  no gingival abnormalities and pharynx pink and moist.   Neck:  supple and no masses.   Lungs:  normal respiratory effort and normal breath sounds.   Heart:  normal rate and regular rhythm.   Abdomen:  soft, non-tender, and normal bowel sounds.   Msk:  no joint tenderness and no joint swelling. , no obvious chest wall tender  Pulses:  pulses intact without delay   nl cap refill  Extremities:  no edema, no erythema  Neurologic:  strength normal in all extremities and gait normal.     EKG  Procedure date:  07/17/2010  Findings:  Normal sinus rhythm with rate of: 93.   Impression & Recommendations:  Problem # 1:  PALPITATIONS (ICD-785.1) The etiology is still unclear. I have asked him to undergo a period of watchful waiting. I will see him back in several months. If the frequency of his symptoms increases then he will follow up sooner. His updated medication list for this problem includes:    Lisinopril 10 Mg Tabs (Lisinopril) .Marland Kitchen... 1po once daily    Ecotrin Low Strength 81 Mg Tbec (Aspirin) .Marland Kitchen... 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 He will continue ASA and lipitor and will follow. His updated medication list for this problem includes:    Ecotrin Low Strength 81 Mg Tbec (Aspirin) .Marland Kitchen... 1 by mouth qd  Patient Instructions: 1)  Your physician wants you to follow-up in:  6 months with Dr Court Joy will receive a reminder letter in the mail two months in advance. If you don't receive a letter, please call our office to schedule the follow-up appointment. 2)  Your physician recommends that you continue on your current medications as directed. Please refer to the Current Medication list given to you today.

## 2010-07-24 NOTE — Assessment & Plan Note (Signed)
Summary: post hosp / cd   Vital Signs:  Patient profile:   65 year old male Height:      69.5 inches Weight:      232.50 pounds BMI:     33.96 O2 Sat:      95 % on Room air Temp:     98 degrees F oral Pulse rate:   83 / minute BP sitting:   132 / 90  (left arm) Cuff size:   large  Vitals Entered By: Zella Ball Ewing CMA Duncan Dull) (June 20, 2010 10:24 AM)  O2 Flow:  Room air  CC: post Hospital/RE   CC:  post Hospital/RE.  History of Present Illness: here to f/u after being seen in the ER yesterday with weakness and neck pain;  pt relates today he acutally had also lightheded and palpitations on getting back home from taking the car to be repaired. No heavy  lifting or other activiry, adn on vacation this wk ,without increased excerecise or other activity.  Pt denies CP, worsening sob, doe, wheezing, orthopnea, pnd, worsening LE edema, or syncope ,  but also occasional sharp fleeting pains to the ant chest.   No headache, ST,  and No fever, wt loss, night sweats, loss of appetite or other constitutional symptoms   Did have quite a bit of belching to go with the palpiatoins.  No abd pain, n/v, blood and no recent overt bleeding or bruising, but does occasional constipatoin.  ECG and Istat in th eER neg for acute and d/c'd to home as symtpoms also resolved.  Got home late in the evening, went to bed, and no further recurrence of symtpoms today, only some mild nasal allergy right sided congestion.  Overall good compliance with meds, and good tolerability. ,  no new meds and no OTC med use except for occas laxative as needed in the past 2 wks.  Denies worsening depressive symptoms, suicidal ideation, or panic.  No increased recent stress.  Pt denies new neuro symptoms such as headache, facial or extremity weakness . Pt denies polydipsia, polyuria. Does have also have recurring 4 wks intermittent pain to the left neck , burning type, midl to mod, with radation to the elbow, better with nsaid as needed.   No UE worsening weakness or numbness, Head and neck movemenet does not seem to make worse.  Last echo earlier this yr.  Has not had holter or 30 day event monitor. Last stress test approx 2 yrs per pt.  Pt very concerned due to symptoms, as well as strong FH of mult siblings with recent CABG and stents  Problems Prior to Update: 1)  Neck Pain, Left  (ICD-723.1) 2)  Postural Lightheadedness  (ICD-780.4) 3)  Palpitations  (ICD-785.1) 4)  Uri  (ICD-465.9) 5)  Dizziness  (ICD-780.4) 6)  Carotid Artery Stenosis, Bilateral  (ICD-433.10) 7)  Chest Pain  (ICD-786.50) 8)  Magnetic Resonance Imaging, Brain, Abnormal  (ICD-794.09) 9)  Pvd  (ICD-443.9) 10)  Dyspnea/shortness of Breath  (ICD-786.09) 11)  Rash-nonvesicular  (ICD-782.1) 12)  Hyperlipidemia  (ICD-272.4) 13)  Intermittent Vertigo  (ICD-780.4) 14)  Otitis Media, Left  (ICD-382.9) 15)  Cephalgia  (ICD-784.0) 16)  Dizziness  (ICD-780.4) 17)  Preventive Health Care  (ICD-V70.0) 18)  Chest Pain  (ICD-786.50) 19)  Hypertension  (ICD-401.9) 20)  Hypercholesterolemia  (ICD-272.0) 21)  Preventive Health Care  (ICD-V70.0) 22)  Colonic Polyps, Hx of  (ICD-V12.72) 23)  Esophageal Stricture  (ICD-530.3) 24)  Glucose Intolerance  (ICD-271.3) 25)  Allergic  Rhinitis  (ICD-477.9) 26)  Routine General Medical Exam@health  Care Facl  (ICD-V70.0) 27)  Depression  (ICD-311) 28)  Anxiety  (ICD-300.00) 29)  Low Back Pain  (ICD-724.2) 30)  Erectile Dysfunction  (ICD-302.72) 31)  Hemorrhoids, Internal  (ICD-455.0) 32)  Allergy  (ICD-995.3) 33)  Esophagitis  (ICD-530.10) 34)  Gerd  (ICD-530.81) 35)  Obesity  (ICD-278.00)  Medications Prior to Update: 1)  Omeprazole 20 Mg  Cpdr (Omeprazole) .... 2 By Mouth Once Daily 2)  Lipitor 40 Mg  Tabs (Atorvastatin Calcium) .Marland Kitchen.. 1 By Mouth Once Daily 3)  Lisinopril 10 Mg Tabs (Lisinopril) .Marland Kitchen.. 1po Once Daily 4)  Ecotrin Low Strength 81 Mg  Tbec (Aspirin) .Marland Kitchen.. 1 By Mouth Qd 5)  Diclofenac Sodium 75 Mg Tbec  (Diclofenac Sodium) .Marland Kitchen.. 1po Two Times A Day As Needed 6)  Flexeril 5 Mg Tabs (Cyclobenzaprine Hcl) .Marland Kitchen.. 1 By Mouth Three Times A Day As Needed 7)  Nasonex 50 Mcg/act Susp (Mometasone Furoate) .... 2 Spray/side Once Daily 8)  Meclizine Hcl 12.5 Mg Tabs (Meclizine Hcl) .Marland Kitchen.. 1 - 2 By Mouth Q 6 Hrs As Needed 9)  Sertraline Hcl 50 Mg Tabs (Sertraline Hcl) .Marland Kitchen.. 1 By Mouth Once Daily 10)  Cialis 20 Mg Tabs (Tadalafil) .... Use Asd 1 By Mouth Every Other Day As Needed  Current Medications (verified): 1)  Omeprazole 20 Mg  Cpdr (Omeprazole) .... 2 By Mouth Once Daily 2)  Lipitor 40 Mg  Tabs (Atorvastatin Calcium) .Marland Kitchen.. 1 By Mouth Once Daily 3)  Lisinopril 10 Mg Tabs (Lisinopril) .Marland Kitchen.. 1po Once Daily 4)  Ecotrin Low Strength 81 Mg  Tbec (Aspirin) .Marland Kitchen.. 1 By Mouth Qd 5)  Diclofenac Sodium 75 Mg Tbec (Diclofenac Sodium) .Marland Kitchen.. 1po Two Times A Day As Needed 6)  Flexeril 5 Mg Tabs (Cyclobenzaprine Hcl) .Marland Kitchen.. 1 By Mouth Three Times A Day As Needed 7)  Nasonex 50 Mcg/act Susp (Mometasone Furoate) .... 2 Spray/side Once Daily 8)  Meclizine Hcl 12.5 Mg Tabs (Meclizine Hcl) .Marland Kitchen.. 1 - 2 By Mouth Q 6 Hrs As Needed 9)  Sertraline Hcl 50 Mg Tabs (Sertraline Hcl) .Marland Kitchen.. 1 By Mouth Once Daily 10)  Cialis 20 Mg Tabs (Tadalafil) .... Use Asd 1 By Mouth Every Other Day As Needed  Allergies (verified): 1)  ! Ceftin  Past History:  Past Medical History: Last updated: 10/04/2009 HEMORRHOIDS, INTERNAL (ICD-455.0) ALLERGY (ICD-995.3) HYPERCHOLESTEROLEMIA (ICD-272.0) ESOPHAGITIS (ICD-530.10) esophageal stricture GERD (ICD-530.81) OBESITY (ICD-278.00) Erectile Dysfunction - Dr Brunilda Payor Low back pain Anxiety Depression Hypertension hx of PHN Allergic rhinitis right knee DJD glucose intolerance Hyperlipidemia Colonic polyps, hx of - hyperplastic 5/06 premature ejaculation - on SSRI - Dr Brunilda Payor  Past Surgical History: Last updated: 09/16/2007 Tonsillectomy s/p right knee arthroscopy  Family History: Last updated:  06/20/2010 father with heart disease/CHF mother with lymphoma sister with ovary cancer sister with lung cancer, vascular disease sister with CABG brother with coronary stent/CAD  Social History: Last updated: 10/04/2009 Married 2 children Former Smoker Alcohol use-yes work - Arts development [employer] Drug use-no  Risk Factors: Smoking Status: quit (06/08/2009)  Family History: father with heart disease/CHF mother with lymphoma sister with ovary cancer sister with lung cancer, vascular disease sister with CABG brother with coronary stent/CAD  Review of Systems  The patient denies anorexia, fever, vision loss, decreased hearing, hoarseness, chest pain, syncope, peripheral edema, prolonged cough, headaches, hemoptysis, abdominal pain, melena, hematochezia, severe indigestion/heartburn, hematuria, muscle weakness, suspicious skin lesions, transient blindness, difficulty walking, depression, unusual weight change, abnormal bleeding, enlarged lymph nodes,  and angioedema.         all otherwise negative per pt -    Physical Exam  General:  alert and overweight-appearing.   Head:  normocephalic and atraumatic.   Eyes:  vision grossly intact, pupils equal, and pupils round.   Ears:  R ear normal and L ear normal.   Nose:  no external deformity and no nasal discharge.   Mouth:  no gingival abnormalities and pharynx pink and moist.   Neck:  supple and no masses.   Lungs:  normal respiratory effort and normal breath sounds.   Heart:  normal rate and regular rhythm.   Abdomen:  soft, non-tender, and normal bowel sounds.   Msk:  no joint tenderness and no joint swelling. , no obvious chest wall tender  Extremities:  no edema, no erythema  Neurologic:  strength normal in all extremities and gait normal.   Skin:  color normal and no rashes.   Psych:  not anxious appearing and not depressed appearing.     Impression & Recommendations:  Problem # 1:  PALPITATIONS  (ICD-785.1)  described as skipped beats, and seem possibly assoc with belching/GI upset, but also with lightheaded and weak and fleeting CP (but no sob, syncope) and with strong FH cant r/o more significant etiology such as SVT or even afib;  will ask for 24 hr holter;  and refer back to Dr Gabriel Cirri for further consideration;  has carotid dopplers already sched'd for f/u jan 2012, and may need further eval such as 30 day event monitor? or stress test? or repeat echo?;  no need for further labs or ecg today  Orders: Cardiology Referral (Cardiology) Misc. Referral (Misc. Ref)  Problem # 2:  POSTURAL LIGHTHEADEDNESS (ICD-780.4)  does not seem dehydrated today/volume ok;  symptom as with above, no recurrence today;  for tx and eval as  above  Orders: Cardiology Referral (Cardiology)  Problem # 3:  HYPERTENSION (ICD-401.9)  His updated medication list for this problem includes:    Lisinopril 10 Mg Tabs (Lisinopril) .Marland Kitchen... 1po once daily  BP today: 132/90 Prior BP: 122/80 (10/04/2009)  Labs Reviewed: K+: 3.9 (09/30/2009) Creat: : 1.0 (09/30/2009)   Chol: 168 (09/30/2009)   HDL: 55.10 (09/30/2009)   LDL: 104 (09/30/2009)   TG: 46.0 (09/30/2009) stable overall by hx and exam, ok to continue meds/tx as is   Problem # 4:  HYPERLIPIDEMIA (ICD-272.4)  His updated medication list for this problem includes:    Lipitor 40 Mg Tabs (Atorvastatin calcium) .Marland Kitchen... 1 by mouth once daily  Labs Reviewed: SGOT: 35 (09/30/2009)   SGPT: 48 (09/30/2009)   HDL:55.10 (09/30/2009), 48.40 (09/25/2008)  LDL:104 (09/30/2009), 105 (09/25/2008)  Chol:168 (09/30/2009), 168 (09/25/2008)  Trig:46.0 (09/30/2009), 71.0 (09/25/2008) d/w pt - Pt to continue diet efforts, good med tolerance; to check labs next visit - goal LDL less than 70   Problem # 5:  NECK PAIN, LEFT (ICD-723.1)  His updated medication list for this problem includes:    Ecotrin Low Strength 81 Mg Tbec (Aspirin) .Marland Kitchen... 1 by mouth qd    Diclofenac  Sodium 75 Mg Tbec (Diclofenac sodium) .Marland Kitchen... 1po two times a day as needed    Flexeril 5 Mg Tabs (Cyclobenzaprine hcl) .Marland Kitchen... 1 by mouth three times a day as needed c/w radiculitis like pain vs flare undrlying c-spine DJD/DDD;  exam benign, Continue all previous medications as before this visit , f/u any worsening symptoms  Complete Medication List: 1)  Omeprazole 20 Mg Cpdr (Omeprazole) .... 2  by mouth once daily 2)  Lipitor 40 Mg Tabs (Atorvastatin calcium) .Marland Kitchen.. 1 by mouth once daily 3)  Lisinopril 10 Mg Tabs (Lisinopril) .Marland Kitchen.. 1po once daily 4)  Ecotrin Low Strength 81 Mg Tbec (Aspirin) .Marland Kitchen.. 1 by mouth qd 5)  Diclofenac Sodium 75 Mg Tbec (Diclofenac sodium) .Marland Kitchen.. 1po two times a day as needed 6)  Flexeril 5 Mg Tabs (Cyclobenzaprine hcl) .Marland Kitchen.. 1 by mouth three times a day as needed 7)  Nasonex 50 Mcg/act Susp (Mometasone furoate) .... 2 spray/side once daily 8)  Meclizine Hcl 12.5 Mg Tabs (Meclizine hcl) .Marland Kitchen.. 1 - 2 by mouth q 6 hrs as needed 9)  Sertraline Hcl 50 Mg Tabs (Sertraline hcl) .Marland Kitchen.. 1 by mouth once daily 10)  Cialis 20 Mg Tabs (Tadalafil) .... Use asd 1 by mouth every other day as needed  Patient Instructions: 1)  You will be contacted about the referral(s) to: 24 hour monitor, and Dr Tilley/cardiology 2)  Continue all previous medications as before this visit 3)  Please schedule a follow-up appointment in April 2012 for CPX with labs   Orders Added: 1)  Cardiology Referral [Cardiology] 2)  Misc. Referral [Misc. Ref] 3)  Est. Patient Level V [16109]

## 2010-07-24 NOTE — Miscellaneous (Signed)
Summary: Orders Update  Clinical Lists Changes  Orders: Added new Test order of Carotid Duplex (Carotid Duplex) - Signed 

## 2010-07-24 NOTE — Progress Notes (Signed)
Summary: c/o pain in left shoulder    Phone Note Call from Patient Call back at Home Phone [phone]   Caller: Patient Reason for Call: Talk to Nurse Summary of Call: c/o pain in left shoulder, heart racing on yesterday. no nitro taken, no er visit.  Initial call taken by: Lorne Skeens,  June 19, 2010 3:11 PM  Follow-up for Phone Call        spoke with pt who was c/o a burning in left shoulder into back and down left arm.  He also is c/o heart racing yesterday.  I explianed to him that these symptoms are difficult to diagnose over the phone.  He needs to go the ER to have this looked at the longer he waits the worse the damage could be if in fact this is his heart causing the pain Dennis Bast, RN, BSN  June 19, 2010 4:53 PM

## 2010-07-24 NOTE — Progress Notes (Signed)
Summary: No show appt  Phone Note From Other Clinic   Caller: Receptionist Summary of Call: Dr York Spaniel office called to inform MD that pt no-showed for appt today. Initial call taken by: Margaret Pyle, CMA,  June 24, 2010 1:53 PM  Follow-up for Phone Call        noted Follow-up by: Corwin Levins MD,  June 24, 2010 5:24 PM

## 2010-07-24 NOTE — Procedures (Signed)
Summary: summary report  summary report   Imported By: Mirna Mires 07/17/2010 16:17:04  _____________________________________________________________________  External Attachment:    Type:   Image     Comment:   External Document

## 2010-09-01 LAB — POCT I-STAT, CHEM 8
BUN: 17 mg/dL (ref 6–23)
Calcium, Ion: 1.17 mmol/L (ref 1.12–1.32)
Chloride: 108 meq/L (ref 96–112)
Creatinine, Ser: 0.9 mg/dL (ref 0.4–1.5)
Glucose, Bld: 117 mg/dL — ABNORMAL HIGH (ref 70–99)
HCT: 43 % (ref 39.0–52.0)
Hemoglobin: 14.6 g/dL (ref 13.0–17.0)
Potassium: 4.1 meq/L (ref 3.5–5.1)
Sodium: 141 mEq/L (ref 135–145)
TCO2: 26 mmol/L (ref 0–100)

## 2010-09-01 LAB — POCT CARDIAC MARKERS
CKMB, poc: <1 ng/mL — ABNORMAL LOW (ref 1.0–8.0)
Myoglobin, poc: 46.9 ng/mL (ref 12–200)
Troponin i, poc: <0.05 ng/mL (ref 0.00–0.09)

## 2010-10-01 ENCOUNTER — Other Ambulatory Visit: Payer: Self-pay

## 2010-10-01 ENCOUNTER — Other Ambulatory Visit: Payer: Self-pay | Admitting: Internal Medicine

## 2010-10-01 DIAGNOSIS — Z0000 Encounter for general adult medical examination without abnormal findings: Secondary | ICD-10-CM

## 2010-10-01 DIAGNOSIS — Z1289 Encounter for screening for malignant neoplasm of other sites: Secondary | ICD-10-CM

## 2010-10-02 ENCOUNTER — Other Ambulatory Visit (INDEPENDENT_AMBULATORY_CARE_PROVIDER_SITE_OTHER): Payer: BLUE CROSS/BLUE SHIELD

## 2010-10-02 DIAGNOSIS — Z1289 Encounter for screening for malignant neoplasm of other sites: Secondary | ICD-10-CM

## 2010-10-02 DIAGNOSIS — Z0000 Encounter for general adult medical examination without abnormal findings: Secondary | ICD-10-CM

## 2010-10-02 LAB — BASIC METABOLIC PANEL
CO2: 27 mEq/L (ref 19–32)
Creatinine, Ser: 1 mg/dL (ref 0.4–1.5)
GFR: 99.56 mL/min (ref >60.00)
Glucose, Bld: 92 mg/dL (ref 70–99)

## 2010-10-02 LAB — CBC WITH DIFFERENTIAL/PLATELET
Basophils Absolute: 0 10*3/uL (ref 0.0–0.1)
Eosinophils Absolute: 0 10*3/uL (ref 0.0–0.7)
Hemoglobin: 14.2 g/dL (ref 13.0–17.0)
Lymphs Abs: 1.4 10*3/uL (ref 0.7–4.0)
MCHC: 34.1 g/dL (ref 30.0–36.0)
Neutro Abs: 3.4 10*3/uL (ref 1.4–7.7)
Platelets: 176 10*3/uL (ref 150.0–400.0)
RDW: 14.1 % (ref 11.5–14.6)
WBC: 5.3 10*3/uL (ref 4.5–10.5)

## 2010-10-02 LAB — URINALYSIS
Ketones, ur: NEGATIVE
Specific Gravity, Urine: 1.02 (ref 1.000–1.030)
Total Protein, Urine: NEGATIVE
Urine Glucose: NEGATIVE
Urobilinogen, UA: 0.2 (ref 0.0–1.0)

## 2010-10-02 LAB — HEPATIC FUNCTION PANEL
Bilirubin, Direct: 0.1 mg/dL (ref 0.0–0.3)
Total Bilirubin: 1 mg/dL (ref 0.3–1.2)
Total Protein: 6.7 g/dL (ref 6.0–8.3)

## 2010-10-02 LAB — LIPID PANEL
HDL: 57.1 mg/dL (ref >39.00)
LDL Cholesterol: 102 mg/dL — ABNORMAL HIGH (ref 0–99)
Total CHOL/HDL Ratio: 3
Triglycerides: 104 mg/dL (ref 0.0–149.0)

## 2010-10-04 ENCOUNTER — Encounter: Payer: Self-pay | Admitting: Internal Medicine

## 2010-10-04 DIAGNOSIS — E1122 Type 2 diabetes mellitus with diabetic chronic kidney disease: Secondary | ICD-10-CM | POA: Insufficient documentation

## 2010-10-04 DIAGNOSIS — R7302 Impaired glucose tolerance (oral): Secondary | ICD-10-CM

## 2010-10-04 DIAGNOSIS — Z0001 Encounter for general adult medical examination with abnormal findings: Secondary | ICD-10-CM | POA: Insufficient documentation

## 2010-10-04 DIAGNOSIS — E119 Type 2 diabetes mellitus without complications: Secondary | ICD-10-CM | POA: Insufficient documentation

## 2010-10-04 HISTORY — DX: Impaired glucose tolerance (oral): R73.02

## 2010-10-08 ENCOUNTER — Encounter: Payer: Self-pay | Admitting: Internal Medicine

## 2010-10-08 ENCOUNTER — Ambulatory Visit (INDEPENDENT_AMBULATORY_CARE_PROVIDER_SITE_OTHER): Payer: BLUE CROSS/BLUE SHIELD | Admitting: Internal Medicine

## 2010-10-08 ENCOUNTER — Other Ambulatory Visit: Payer: Self-pay

## 2010-10-08 VITALS — BP 142/80 | HR 63 | Temp 98.3°F | Ht 69.0 in | Wt 230.6 lb

## 2010-10-08 DIAGNOSIS — Z79899 Other long term (current) drug therapy: Secondary | ICD-10-CM | POA: Insufficient documentation

## 2010-10-08 DIAGNOSIS — R7302 Impaired glucose tolerance (oral): Secondary | ICD-10-CM

## 2010-10-08 DIAGNOSIS — Z0000 Encounter for general adult medical examination without abnormal findings: Secondary | ICD-10-CM

## 2010-10-08 DIAGNOSIS — E7800 Pure hypercholesterolemia, unspecified: Secondary | ICD-10-CM

## 2010-10-08 DIAGNOSIS — R7309 Other abnormal glucose: Secondary | ICD-10-CM

## 2010-10-08 DIAGNOSIS — I10 Essential (primary) hypertension: Secondary | ICD-10-CM

## 2010-10-08 MED ORDER — DICLOFENAC SODIUM 75 MG PO TBEC: 75.0000 mg | DELAYED_RELEASE_TABLET | Freq: Two times a day (BID) | ORAL | Status: DC

## 2010-10-08 MED ORDER — TADALAFIL 20 MG PO TABS: 20.0000 mg | ORAL_TABLET | Freq: Every day | ORAL | Status: AC | PRN

## 2010-10-08 MED ORDER — CYCLOBENZAPRINE HCL 5 MG PO TABS: 5.0000 mg | ORAL_TABLET | Freq: Three times a day (TID) | ORAL | Status: DC | PRN

## 2010-10-08 MED ORDER — SERTRALINE HCL 50 MG PO TABS: 50.0000 mg | ORAL_TABLET | Freq: Every day | ORAL | Status: DC

## 2010-10-08 MED ORDER — ATORVASTATIN CALCIUM 80 MG PO TABS: 80.0000 mg | ORAL_TABLET | Freq: Every day | ORAL | Status: DC

## 2010-10-08 NOTE — Progress Notes (Signed)
Subjective:    Patient ID: Warren Weeks, male    DOB: 29-Dec-1945, 65 y.o.   MRN: [id number]  HPI Here for wellness and f/u;  Overall doing ok;  Pt denies CP, worsening SOB, DOE, wheezing, orthopnea, PND, worsening LE edema, palpitations, dizziness or syncope.  Pt denies neurological change such as new Headache, facial or extremity weakness.  Pt denies polydipsia, polyuria, or low sugar symptoms. Pt states overall good compliance with treatment and medications, good tolerability, and trying to follow lower cholesterol diet.  Pt denies worsening depressive symptoms, suicidal ideation or panic. No fever, wt loss, night sweats, loss of appetite, or other constitutional symptoms.  Pt states good ability with ADL's, low fall risk, home safety reviewed and adequate, no significant changes in hearing or vision, and occasionally active with exercise.   Pt has recurring bilateral LBP for one wk with some radiation to bilat lower hips, mild to mod, without change in severity, bowel or bladder change, fever, wt loss,  worsening LE pain/numbness/weakness, gait change or falls. Has been doing more walking for exercise up to 2 miles per day but does not make worse.  Pt states BP at home < 130-140, also with mild nasal allergy sinus congestion, but does not want refill on the nasonex at this time. Past Medical History  Diagnosis Date  . GLUCOSE INTOLERANCE 09/16/2007  . HYPERCHOLESTEROLEMIA 03/24/2007  . HYPERLIPIDEMIA 09/16/2007  . OBESITY 03/24/2007  . ANXIETY 03/28/2007  . ERECTILE DYSFUNCTION 03/28/2007  . DEPRESSION 03/28/2007  . OTITIS MEDIA, LEFT 06/10/2009  . HYPERTENSION 03/28/2007  . CAROTID ARTERY STENOSIS, BILATERAL 07/01/2009  . PVD 06/25/2009  . HEMORRHOIDS, INTERNAL 03/24/2007  . URI 08/16/2009  . ALLERGIC RHINITIS 09/16/2007  . ESOPHAGITIS 03/24/2007  . ESOPHAGEAL STRICTURE 09/16/2007  . GERD 03/24/2007  . LOW BACK PAIN 03/28/2007  . Dizziness and giddiness 06/08/2009  . RASH-NONVESICULAR 06/17/2009  .  CEPHALGIA 06/08/2009  . DYSPNEA/SHORTNESS OF BREATH 09/16/2007  . CHEST PAIN 09/16/2007  . MAGNETIC RESONANCE IMAGING, BRAIN, ABNORMAL 06/26/2009  . ALLERGY 03/24/2007  . COLONIC POLYPS, HX OF 09/16/2007  . NECK PAIN, LEFT 06/20/2010  . Palpitations 06/20/2010  . Impaired glucose tolerance 10/04/2010   Past Surgical History  Procedure Date  . Tonsillectomy   . S/p right knee arthroscopy     reports that he has quit smoking. He does not have any smokeless tobacco history on file. He reports that he drinks alcohol. He reports that he does not use illicit drugs. family history includes Coronary artery disease in his brother and sister; Heart disease in his father; Lung cancer in his sister; Lymphoma in his mother; and Ovarian cancer in his sister. Allergies  Allergen Reactions  . Cefuroxime Axetil    Current Outpatient Prescriptions on File Prior to Visit  Medication Sig Dispense Refill  . aspirin (ECOTRIN LOW STRENGTH) 81 MG EC tablet Take 81 mg by mouth daily.        . colchicine (COLCRYS) 0.6 MG tablet Take 0.6 mg by mouth as needed.        Marland Kitchen lisinopril (PRINIVIL,ZESTRIL) 10 MG tablet Take 10 mg by mouth daily.        . meclizine (ANTIVERT) 12.5 MG tablet Take 12.5 mg by mouth. 1 - 2 by mouth every 6 hours as needed       . mometasone (NASONEX) 50 MCG/ACT nasal spray 2 sprays by Nasal route daily.        Marland Kitchen omeprazole (PRILOSEC) 20 MG capsule Take 20 mg by  mouth 2 (two) times daily.        . vitamin E 600 UNIT capsule Take 600 Units by mouth daily.        Marland Kitchen DISCONTD: atorvastatin (LIPITOR) 40 MG tablet Take 40 mg by mouth daily.        Marland Kitchen DISCONTD: cyclobenzaprine (FLEXERIL) 5 MG tablet Take 5 mg by mouth 3 (three) times daily as needed.        Marland Kitchen DISCONTD: diclofenac (VOLTAREN) 75 MG EC tablet Take 75 mg by mouth 2 (two) times daily.         Review of Systems Review of Systems  Constitutional: Negative for diaphoresis, activity change, appetite change and unexpected weight change.  HENT:  Negative for hearing loss, ear pain, facial swelling, mouth sores and neck stiffness.   Eyes: Negative for pain, redness and visual disturbance.  Respiratory: Negative for shortness of breath and wheezing.   Cardiovascular: Negative for chest pain and palpitations.  Gastrointestinal: Negative for diarrhea, blood in stool, abdominal distention and rectal pain.  Genitourinary: Negative for hematuria, flank pain and decreased urine volume.  Musculoskeletal: Negative for myalgias and joint swelling.  Skin: Negative for color change and wound.  Neurological: Negative for syncope and numbness.  Hematological: Negative for adenopathy.  Psychiatric/Behavioral: Negative for hallucinations, self-injury, decreased concentration and agitation.      Objective:   Physical Exam BP 142/80  Pulse 63  Temp(Src) 98.3 F (36.8 C) (Oral)  Ht 5\' 9"  (1.753 m)  Wt 230 lb 9 oz (104.582 kg)  BMI 34.05 kg/m2  SpO2 95% Physical Exam  VS noted Constitutional: Pt is oriented to person, place, and time. Appears well-developed and well-nourished.  HENT:  Head: Normocephalic and atraumatic.  Right Ear: External ear normal.  Sinus nontender Left Ear: External ear normal.  Nose: Nose normal.  Mouth/Throat: Oropharynx is clear and moist.  Eyes: Conjunctivae and EOM are normal. Pupils are equal, round, and reactive to light.  Neck: Normal range of motion. Neck supple. No JVD present. No tracheal deviation present.  Cardiovascular: Normal rate, regular rhythm, normal heart sounds and intact distal pulses.   Pulmonary/Chest: Effort normal and breath sounds normal.  Abdominal: Soft. Bowel sounds are normal. There is no tenderness.  Musculoskeletal: Normal range of motion. Exhibits no edema.  Lymphadenopathy:  Has no cervical adenopathy.  Neurological: Pt is alert and oriented to person, place, and time. Pt has normal reflexes. No cranial nerve deficit. Motor/dtr intact Skin: Skin is warm and dry. No rash noted.    Psychiatric:  Has  normal mood and affect. Behavior is normal.  Spine nontender througout       Assessment & Plan:

## 2010-10-08 NOTE — Patient Instructions (Addendum)
Increase the lipitor to 80 mg Please return in 4 wks for LAB only:  Lipids, and liver tests Please call the number on the Blue Card (the [employer] System) for results of testing in 2-3 days The current medical regimen is effective;  continue present plan and medications. You are given the viagra samples today, and the rx for cialis Please continue to monitor your blood pressure closely at home Please return in 6 mo with Lab testing done 3-5 days before

## 2010-10-08 NOTE — Assessment & Plan Note (Signed)
asympt - for a1c check next visit

## 2010-10-08 NOTE — Assessment & Plan Note (Signed)
Uncontrolled with goal LDL < 70 - to increase the lipitor to 80 mg., f/u labs in 4 wks

## 2010-10-08 NOTE — Assessment & Plan Note (Signed)
stable overall by hx and exam, most recent lab reviewed with pt, and pt to continue medical treatment as before   BP Readings from Last 3 Encounters:  10/08/10 142/80  07/17/10 118/78  06/20/10 132/90

## 2010-11-07 ENCOUNTER — Other Ambulatory Visit (INDEPENDENT_AMBULATORY_CARE_PROVIDER_SITE_OTHER): Payer: BLUE CROSS/BLUE SHIELD

## 2010-11-07 DIAGNOSIS — Z79899 Other long term (current) drug therapy: Secondary | ICD-10-CM

## 2010-11-07 DIAGNOSIS — E7800 Pure hypercholesterolemia, unspecified: Secondary | ICD-10-CM

## 2010-11-07 DIAGNOSIS — R7302 Impaired glucose tolerance (oral): Secondary | ICD-10-CM

## 2010-11-07 DIAGNOSIS — R7309 Other abnormal glucose: Secondary | ICD-10-CM

## 2010-11-07 LAB — HEPATIC FUNCTION PANEL
AST: 30 U/L (ref 0–37)
Albumin: 3.6 g/dL (ref 3.5–5.2)
Alkaline Phosphatase: 78 U/L (ref 39–117)
Total Protein: 6.2 g/dL (ref 6.0–8.3)

## 2010-11-07 LAB — LIPID PANEL
Cholesterol: 166 mg/dL (ref 0–200)
LDL Cholesterol: 98 mg/dL (ref 0–99)
Triglycerides: 71 mg/dL (ref 0.0–149.0)

## 2010-11-07 LAB — BASIC METABOLIC PANEL
BUN: 12 mg/dL (ref 6–23)
Calcium: 9.3 mg/dL (ref 8.4–10.5)
Creatinine, Ser: 0.9 mg/dL (ref 0.4–1.5)
GFR: 117.29 mL/min (ref >60.00)
Potassium: 5.1 mEq/L (ref 3.5–5.1)

## 2010-11-07 NOTE — Progress Notes (Signed)
Quick Note:  Voice message left on [employer] system - lab is negative, normal or otherwise stable, pt to continue same tx ______ 

## 2010-11-10 ENCOUNTER — Other Ambulatory Visit: Payer: Self-pay

## 2010-11-10 MED ORDER — OMEPRAZOLE 20 MG PO CPDR: 20.0000 mg | DELAYED_RELEASE_CAPSULE | Freq: Two times a day (BID) | ORAL | Status: DC

## 2010-11-12 ENCOUNTER — Telehealth: Payer: Self-pay | Admitting: *Deleted

## 2010-11-12 NOTE — Telephone Encounter (Signed)
PA requested for: Omeprazole 20mg  CPDR/PA Form attached [recvd 11/06/10] PA form forwarded to MD for completion [11/06/10] Completed form recvd and faxed to [employer] for Approval [facility], 11/10/10] Approval received & faxed to pharmacy [11/12/10]

## 2010-11-26 ENCOUNTER — Other Ambulatory Visit: Payer: Self-pay | Admitting: Internal Medicine

## 2010-12-22 ENCOUNTER — Encounter: Payer: Self-pay | Admitting: Internal Medicine

## 2010-12-22 ENCOUNTER — Ambulatory Visit (INDEPENDENT_AMBULATORY_CARE_PROVIDER_SITE_OTHER): Payer: BLUE CROSS/BLUE SHIELD | Admitting: Internal Medicine

## 2010-12-22 VITALS — BP 140/80 | HR 86 | Temp 98.2°F | Resp 14 | Wt 230.0 lb

## 2010-12-22 DIAGNOSIS — R7302 Impaired glucose tolerance (oral): Secondary | ICD-10-CM

## 2010-12-22 DIAGNOSIS — R21 Rash and other nonspecific skin eruption: Secondary | ICD-10-CM

## 2010-12-22 DIAGNOSIS — F411 Generalized anxiety disorder: Secondary | ICD-10-CM

## 2010-12-22 DIAGNOSIS — I10 Essential (primary) hypertension: Secondary | ICD-10-CM

## 2010-12-22 DIAGNOSIS — R7309 Other abnormal glucose: Secondary | ICD-10-CM

## 2010-12-22 MED ORDER — METHYLPREDNISOLONE ACETATE 80 MG/ML IJ SUSP
120.0000 mg | Freq: Once | INTRAMUSCULAR | Status: AC
  Administered 2010-12-22: 120 mg via INTRAMUSCULAR

## 2010-12-22 MED ORDER — PREDNISONE 10 MG PO TABS: 10.0000 mg | ORAL_TABLET | Freq: Every day | ORAL | Status: AC

## 2010-12-22 NOTE — Patient Instructions (Addendum)
You had the steroid shot today Take all new medications as prescribed - the prednisone Continue all other medications as before  

## 2010-12-22 NOTE — Assessment & Plan Note (Signed)
Suspect allergic type ;  For depomedrol IM, and predpack for home, pt warned about sugars increased temporarily and will follow closely; consider derm if not improved

## 2010-12-24 ENCOUNTER — Encounter: Payer: Self-pay | Admitting: Internal Medicine

## 2010-12-24 NOTE — Assessment & Plan Note (Signed)
Mild, pt to monitor for onset polys, and call or return for cbg > 200

## 2010-12-24 NOTE — Progress Notes (Signed)
Subjective:    Patient ID: Warren Weeks, male    DOB: [birth year], 65 y.o.   MRN: [id number]  HPI  Pt here to c/o intensely pruritic rash to the legs, with multiple relatively small areas erythema to bilat legs below the knees, without fever, trauma, swelling or recent rx med or OTC med change. No prior hx of this, no hx of vasculitis.  Denies claudicaiton symptoms, gait is ok.  Pt denies chest pain, increased sob or doe, wheezing, orthopnea, PND, increased LE swelling, palpitations, dizziness or syncope.  Pt denies new neurological symptoms such as new headache, or facial or extremity weakness or numbness   Pt denies polydipsia, polyuria, or low sugar symptoms such as weakness or confusion improved with po intake.  Pt states overall good compliance with meds, trying to follow lower cholesterol, diabetic diet, wt overall stable but little exercise however. Past Medical History  Diagnosis Date  . GLUCOSE INTOLERANCE 09/16/2007  . HYPERCHOLESTEROLEMIA 03/24/2007  . HYPERLIPIDEMIA 09/16/2007  . OBESITY 03/24/2007  . ANXIETY 03/28/2007  . ERECTILE DYSFUNCTION 03/28/2007  . DEPRESSION 03/28/2007  . OTITIS MEDIA, LEFT 06/10/2009  . HYPERTENSION 03/28/2007  . CAROTID ARTERY STENOSIS, BILATERAL 07/01/2009  . PVD 06/25/2009  . HEMORRHOIDS, INTERNAL 03/24/2007  . URI 08/16/2009  . ALLERGIC RHINITIS 09/16/2007  . ESOPHAGITIS 03/24/2007  . ESOPHAGEAL STRICTURE 09/16/2007  . GERD 03/24/2007  . LOW BACK PAIN 03/28/2007  . Dizziness and giddiness 06/08/2009  . RASH-NONVESICULAR 06/17/2009  . CEPHALGIA 06/08/2009  . DYSPNEA/SHORTNESS OF BREATH 09/16/2007  . CHEST PAIN 09/16/2007  . MAGNETIC RESONANCE IMAGING, BRAIN, ABNORMAL 06/26/2009  . ALLERGY 03/24/2007  . COLONIC POLYPS, HX OF 09/16/2007  . NECK PAIN, LEFT 06/20/2010  . Palpitations 06/20/2010  . Impaired glucose tolerance 10/04/2010   Past Surgical History  Procedure Date  . Tonsillectomy   . S/p right knee arthroscopy     reports that he has quit smoking. He  does not have any smokeless tobacco history on file. He reports that he drinks alcohol. He reports that he does not use illicit drugs. family history includes Coronary artery disease in his brother and sister; Heart disease in his father; Lung cancer in his sister; Lymphoma in his mother; and Ovarian cancer in his sister. Allergies  Allergen Reactions  . Cefuroxime Axetil    Current Outpatient Prescriptions on File Prior to Visit  Medication Sig Dispense Refill  . aspirin (ECOTRIN LOW STRENGTH) 81 MG EC tablet Take 81 mg by mouth daily.        Marland Kitchen atorvastatin (LIPITOR) 80 MG tablet Take 1 tablet (80 mg total) by mouth daily.  30 tablet  11  . colchicine (COLCRYS) 0.6 MG tablet Take 0.6 mg by mouth as needed.        . cyclobenzaprine (FLEXERIL) 5 MG tablet Take 1 tablet (5 mg total) by mouth 3 (three) times daily as needed.  90 tablet  3  . diclofenac (VOLTAREN) 75 MG EC tablet Take 1 tablet (75 mg total) by mouth 2 (two) times daily.  180 tablet  3  . lisinopril (PRINIVIL,ZESTRIL) 10 MG tablet TAKE ONE TABLET BY MOUTH EVERY DAY  90 tablet  3  . meclizine (ANTIVERT) 12.5 MG tablet Take 12.5 mg by mouth. 1 - 2 by mouth every 6 hours as needed       . mometasone (NASONEX) 50 MCG/ACT nasal spray 2 sprays by Nasal route daily.        Marland Kitchen omeprazole (PRILOSEC) 20 MG capsule Take 1 capsule (  20 mg total) by mouth 2 (two) times daily.  180 capsule  1  . sertraline (ZOLOFT) 50 MG tablet Take 1 tablet (50 mg total) by mouth daily.  90 tablet  3  . vitamin E 600 UNIT capsule Take 600 Units by mouth daily.         Review of Systems Review of Systems  Constitutional: Negative for diaphoresis and unexpected weight change.  HENT: Negative for drooling and tinnitus.   Eyes: Negative for photophobia and visual disturbance.  Respiratory: Negative for choking and stridor.   Gastrointestinal: Negative for vomiting and blood in stool.  Genitourinary: Negative for hematuria and decreased urine volume.       Objective:   Physical Exam BP 140/80  Pulse 86  Temp(Src) 98.2 F (36.8 C) (Oral)  Resp 14  Wt 230 lb (104.327 kg)  SpO2 99% Physical Exam  VS noted Constitutional: Pt appears well-developed and well-nourished.  HENT: Head: Normocephalic.  Right Ear: External ear normal.  Left Ear: External ear normal.  Eyes: Conjunctivae and EOM are normal. Pupils are equal, round, and reactive to light.  Neck: Normal range of motion. Neck supple.  Cardiovascular: Normal rate and regular rhythm.   Pulmonary/Chest: Effort normal and breath sounds normal.  Abd:  Soft, NT, non-distended, + BS Neurological: Pt is alert. No cranial nerve deficit.  Skin: Skin is warm. No erythema. except for numerous small slight raised erythem lesions to legs below the knees but above the ankles, without tenderness, red streaks, drainage Psychiatric: Pt behavior is normal. Thought content normal. 1+ nervous, not depressed appearing        Assessment & Plan:

## 2010-12-24 NOTE — Assessment & Plan Note (Signed)
stable overall by hx and exam, most recent data reviewed with pt, and pt to continue medical treatment as before  Lab Results  Component Value Date   WBC 5.3 10/02/2010   HGB 14.2 10/02/2010   HCT 41.5 10/02/2010   PLT 176.0 10/02/2010   CHOL 166 11/07/2010   TRIG 71.0 11/07/2010   HDL 54.20 11/07/2010   ALT 35 11/07/2010   AST 30 11/07/2010   NA 141 11/07/2010   K 5.1 11/07/2010   CL 107 11/07/2010   CREATININE 0.9 11/07/2010   BUN 12 11/07/2010   CO2 29 11/07/2010   TSH 1.92 10/02/2010   PSA 0.12 10/02/2010   HGBA1C 6.4 11/07/2010

## 2010-12-24 NOTE — Assessment & Plan Note (Signed)
stable overall by hx and exam, most recent data reviewed with pt, and pt to continue medical treatment as before  BP Readings from Last 3 Encounters:  12/22/10 140/80  10/08/10 142/80  07/17/10 118/78

## 2011-01-07 ENCOUNTER — Telehealth: Payer: Self-pay | Admitting: *Deleted

## 2011-01-07 DIAGNOSIS — R21 Rash and other nonspecific skin eruption: Secondary | ICD-10-CM

## 2011-01-07 NOTE — Telephone Encounter (Signed)
Left message for pt to callback office.  

## 2011-01-07 NOTE — Telephone Encounter (Signed)
Pt had OV on 12/22/10 for Rash; medicine given not working/itching. Patient states that referral to Dermatologist discussed and would now like to get that referral [cannot get in until August w/o referral].

## 2011-01-07 NOTE — Telephone Encounter (Signed)
Done per emr 

## 2011-01-08 NOTE — Telephone Encounter (Signed)
Pt informed//has appointment tomorrow [01/09/11]

## 2011-01-09 ENCOUNTER — Telehealth: Payer: Self-pay

## 2011-01-09 NOTE — Telephone Encounter (Signed)
The patient was seen for OV 3 weeks ago for a rash on his legs. He did see a [employer], the dermatologist thinks the rash has been caused by Lipitor. Please advise.

## 2011-01-09 NOTE — Telephone Encounter (Signed)
OK to stop the lipitor.  I would hold on further statin med at this time  Can be addressed at next OV

## 2011-01-12 NOTE — Telephone Encounter (Signed)
Pt advised via VM to HOLD medication per message 07/20 and that no alternative has been prescribed because it can be discussed at next OV

## 2011-04-03 ENCOUNTER — Other Ambulatory Visit (INDEPENDENT_AMBULATORY_CARE_PROVIDER_SITE_OTHER): Payer: BLUE CROSS/BLUE SHIELD

## 2011-04-03 ENCOUNTER — Other Ambulatory Visit: Payer: Self-pay | Admitting: Internal Medicine

## 2011-04-03 DIAGNOSIS — R7309 Other abnormal glucose: Secondary | ICD-10-CM

## 2011-04-03 DIAGNOSIS — R7302 Impaired glucose tolerance (oral): Secondary | ICD-10-CM

## 2011-04-03 LAB — LIPID PANEL
Cholesterol: 260 mg/dL — ABNORMAL HIGH (ref 0–200)
HDL: 67.7 mg/dL (ref >39.00)
Total CHOL/HDL Ratio: 4
Triglycerides: 82 mg/dL (ref 0.0–149.0)
VLDL: 16.4 mg/dL (ref 0.0–40.0)

## 2011-04-03 LAB — BASIC METABOLIC PANEL
Calcium: 8.9 mg/dL (ref 8.4–10.5)
Creatinine, Ser: 0.9 mg/dL (ref 0.4–1.5)
GFR: 109.66 mL/min (ref >60.00)
Sodium: 144 mEq/L (ref 135–145)

## 2011-04-03 LAB — LDL CHOLESTEROL, DIRECT: Direct LDL: 176 mg/dL

## 2011-04-09 ENCOUNTER — Ambulatory Visit (INDEPENDENT_AMBULATORY_CARE_PROVIDER_SITE_OTHER): Payer: BLUE CROSS/BLUE SHIELD | Admitting: Internal Medicine

## 2011-04-09 ENCOUNTER — Encounter: Payer: Self-pay | Admitting: Internal Medicine

## 2011-04-09 VITALS — BP 142/82 | HR 72 | Temp 98.5°F | Ht 66.0 in | Wt 234.0 lb

## 2011-04-09 DIAGNOSIS — R7302 Impaired glucose tolerance (oral): Secondary | ICD-10-CM

## 2011-04-09 DIAGNOSIS — E7800 Pure hypercholesterolemia, unspecified: Secondary | ICD-10-CM

## 2011-04-09 DIAGNOSIS — R5383 Other fatigue: Secondary | ICD-10-CM

## 2011-04-09 DIAGNOSIS — I6529 Occlusion and stenosis of unspecified carotid artery: Secondary | ICD-10-CM

## 2011-04-09 DIAGNOSIS — Z0000 Encounter for general adult medical examination without abnormal findings: Secondary | ICD-10-CM

## 2011-04-09 DIAGNOSIS — R5381 Other malaise: Secondary | ICD-10-CM

## 2011-04-09 DIAGNOSIS — Z79899 Other long term (current) drug therapy: Secondary | ICD-10-CM

## 2011-04-09 DIAGNOSIS — R7309 Other abnormal glucose: Secondary | ICD-10-CM

## 2011-04-09 MED ORDER — LISINOPRIL 20 MG PO TABS: 20.0000 mg | ORAL_TABLET | Freq: Every day | ORAL | Status: DC

## 2011-04-09 MED ORDER — ROSUVASTATIN CALCIUM 40 MG PO TABS: 40.0000 mg | ORAL_TABLET | Freq: Every day | ORAL | Status: DC

## 2011-04-09 NOTE — Patient Instructions (Addendum)
Take all new medications as prescribed - the crestor Increase the lisinopril to 20 mg Please return in 4 wks for LAB only:  Liver, cholesterol and kidney function Please call the phone number [phone] (the [employer] System) for results of testing in 2-3 days;  When calling, simply dial the number, and when prompted enter the MRN number above (the Medical Record Number) and the # key, then the message should start. Continue all other medications as before Please return in 6 mo with Lab testing done 3-5 days before

## 2011-04-09 NOTE — Assessment & Plan Note (Signed)
stable overall by hx and exam, most recent data reviewed with pt, and pt to continue medical treatment as before  Lab Results  Component Value Date   HGBA1C 6.2 04/03/2011

## 2011-04-09 NOTE — Assessment & Plan Note (Addendum)
Mild, to also check testosterone with next lab draw,  to f/u any worsening symptoms or concerns

## 2011-04-09 NOTE — Assessment & Plan Note (Signed)
D/w pt  Recent resultd jan 2012,  Needs f/u 2 yrs, cont asa

## 2011-04-09 NOTE — Progress Notes (Signed)
Subjective:    Patient ID: Warren Weeks, male    DOB: 1945/08/11, 65 y.o.   MRN: [id number]  HPI  Here to f/u; overall doing ok,  Pt denies chest pain, increased sob or doe, wheezing, orthopnea, PND, increased LE swelling, palpitations, dizziness or syncope.  Pt denies new neurological symptoms such as new headache, or facial or extremity weakness or numbness   Pt denies polydipsia, polyuria, or low sugar symptoms such as weakness or confusion improved with po intake.  Pt states overall good compliance with meds, trying to follow lower cholesterol, diabetic diet, wt overall stable but little exercise however.  Had to stop generic lipitor due to rash likely allergic as verified per dermatology.   Pt denies fever, wt loss, night sweats, loss of appetite, or other constitutional symptoms  Denies worsening depressive symptoms, suicidal ideation, or panic.  Does have sense of ongoing fatigue, but denies signficant hypersomnolence. Past Medical History  Diagnosis Date  . GLUCOSE INTOLERANCE 09/16/2007  . HYPERCHOLESTEROLEMIA 03/24/2007  . HYPERLIPIDEMIA 09/16/2007  . OBESITY 03/24/2007  . ANXIETY 03/28/2007  . ERECTILE DYSFUNCTION 03/28/2007  . DEPRESSION 03/28/2007  . OTITIS MEDIA, LEFT 06/10/2009  . HYPERTENSION 03/28/2007  . CAROTID ARTERY STENOSIS, BILATERAL 07/01/2009  . PVD 06/25/2009  . HEMORRHOIDS, INTERNAL 03/24/2007  . URI 08/16/2009  . ALLERGIC RHINITIS 09/16/2007  . ESOPHAGITIS 03/24/2007  . ESOPHAGEAL STRICTURE 09/16/2007  . GERD 03/24/2007  . LOW BACK PAIN 03/28/2007  . Dizziness and giddiness 06/08/2009  . RASH-NONVESICULAR 06/17/2009  . CEPHALGIA 06/08/2009  . DYSPNEA/SHORTNESS OF BREATH 09/16/2007  . CHEST PAIN 09/16/2007  . MAGNETIC RESONANCE IMAGING, BRAIN, ABNORMAL 06/26/2009  . ALLERGY 03/24/2007  . COLONIC POLYPS, HX OF 09/16/2007  . NECK PAIN, LEFT 06/20/2010  . Palpitations 06/20/2010  . Impaired glucose tolerance 10/04/2010   Past Surgical History  Procedure Date  . Tonsillectomy     . S/p right knee arthroscopy     reports that he has quit smoking. He does not have any smokeless tobacco history on file. He reports that he drinks alcohol. He reports that he does not use illicit drugs. family history includes Coronary artery disease in his brother and sister; Heart disease in his father; Lung cancer in his sister; Lymphoma in his mother; and Ovarian cancer in his sister. Allergies  Allergen Reactions  . Cefuroxime Axetil    Current Outpatient Prescriptions on File Prior to Visit  Medication Sig Dispense Refill  . aspirin (ECOTRIN LOW STRENGTH) 81 MG EC tablet Take 81 mg by mouth daily.        Marland Kitchen atorvastatin (LIPITOR) 80 MG tablet Take 1 tablet (80 mg total) by mouth daily.  30 tablet  11  . colchicine (COLCRYS) 0.6 MG tablet Take 0.6 mg by mouth as needed.        . cyclobenzaprine (FLEXERIL) 5 MG tablet Take 1 tablet (5 mg total) by mouth 3 (three) times daily as needed.  90 tablet  3  . diclofenac (VOLTAREN) 75 MG EC tablet Take 1 tablet (75 mg total) by mouth 2 (two) times daily.  180 tablet  3  . meclizine (ANTIVERT) 12.5 MG tablet Take 12.5 mg by mouth. 1 - 2 by mouth every 6 hours as needed       . mometasone (NASONEX) 50 MCG/ACT nasal spray 2 sprays by Nasal route daily.        Marland Kitchen omeprazole (PRILOSEC) 20 MG capsule Take 1 capsule (20 mg total) by mouth 2 (two) times daily.  180  capsule  1  . sertraline (ZOLOFT) 50 MG tablet Take 1 tablet (50 mg total) by mouth daily.  90 tablet  3  . vitamin E 600 UNIT capsule Take 600 Units by mouth daily.         Review of Systems Review of Systems  Constitutional: Negative for diaphoresis and unexpected weight change.  HENT: Negative for drooling and tinnitus.   Eyes: Negative for photophobia and visual disturbance.  Respiratory: Negative for choking and stridor.   Gastrointestinal: Negative for vomiting and blood in stool.  Genitourinary: Negative for hematuria and decreased urine volume.       Objective:   Physical  Exam BP 142/82  Pulse 72  Temp(Src) 98.5 F (36.9 C) (Oral)  Ht 5\' 6"  (1.676 m)  Wt 234 lb (106.142 kg)  BMI 37.77 kg/m2  SpO2 96% Physical Exam  VS noted Constitutional: Pt appears well-developed and well-nourished.  HENT: Head: Normocephalic.  Right Ear: External ear normal.  Left Ear: External ear normal.  Eyes: Conjunctivae and EOM are normal. Pupils are equal, round, and reactive to light.  Neck: Normal range of motion. Neck supple.  Cardiovascular: Normal rate and regular rhythm.   Pulmonary/Chest: Effort normal and breath sounds normal.  Abd:  Soft, NT, non-distended, + BS Neurological: Pt is alert. No cranial nerve deficit.  Skin: Skin is warm. No erythema.  Psychiatric: Pt behavior is normal. Thought content normal.     Assessment & Plan:

## 2011-04-12 ENCOUNTER — Encounter: Payer: Self-pay | Admitting: Internal Medicine

## 2011-04-12 NOTE — Assessment & Plan Note (Signed)
To change to crestor asd, f/u labs in 4 wks, goal ldl < 70  Lab Results  Component Value Date   LDLCALC 98 11/07/2010

## 2011-05-07 ENCOUNTER — Other Ambulatory Visit (INDEPENDENT_AMBULATORY_CARE_PROVIDER_SITE_OTHER): Payer: BC Managed Care – PPO

## 2011-05-07 DIAGNOSIS — Z79899 Other long term (current) drug therapy: Secondary | ICD-10-CM

## 2011-05-07 DIAGNOSIS — E7800 Pure hypercholesterolemia, unspecified: Secondary | ICD-10-CM

## 2011-05-07 LAB — LIPID PANEL: Cholesterol: 159 mg/dL (ref 0–200)

## 2011-05-07 LAB — BASIC METABOLIC PANEL
BUN: 13 mg/dL (ref 6–23)
Calcium: 9.1 mg/dL (ref 8.4–10.5)
Creatinine, Ser: 0.9 mg/dL (ref 0.4–1.5)
GFR: 105.56 mL/min (ref >60.00)

## 2011-05-07 LAB — HEPATIC FUNCTION PANEL
Albumin: 3.9 g/dL (ref 3.5–5.2)
Alkaline Phosphatase: 72 U/L (ref 39–117)

## 2011-07-28 ENCOUNTER — Other Ambulatory Visit: Payer: Self-pay | Admitting: Internal Medicine

## 2011-10-01 ENCOUNTER — Other Ambulatory Visit (INDEPENDENT_AMBULATORY_CARE_PROVIDER_SITE_OTHER): Payer: BC Managed Care – PPO

## 2011-10-01 DIAGNOSIS — R7309 Other abnormal glucose: Secondary | ICD-10-CM

## 2011-10-01 DIAGNOSIS — R7302 Impaired glucose tolerance (oral): Secondary | ICD-10-CM

## 2011-10-01 DIAGNOSIS — R5383 Other fatigue: Secondary | ICD-10-CM

## 2011-10-01 DIAGNOSIS — Z0000 Encounter for general adult medical examination without abnormal findings: Secondary | ICD-10-CM

## 2011-10-01 LAB — LIPID PANEL
Cholesterol: 169 mg/dL (ref 0–200)
HDL: 52.1 mg/dL (ref >39.00)
LDL Cholesterol: 102 mg/dL — ABNORMAL HIGH (ref 0–99)
Triglycerides: 77 mg/dL (ref 0.0–149.0)
VLDL: 15.4 mg/dL (ref 0.0–40.0)

## 2011-10-01 LAB — CBC WITH DIFFERENTIAL/PLATELET
Basophils Absolute: 0 10*3/uL (ref 0.0–0.1)
Eosinophils Absolute: 0 10*3/uL (ref 0.0–0.7)
HCT: 42.1 % (ref 39.0–52.0)
Hemoglobin: 14.2 g/dL (ref 13.0–17.0)
Lymphs Abs: 1.3 10*3/uL (ref 0.7–4.0)
MCHC: 33.8 g/dL (ref 30.0–36.0)
Monocytes Relative: 8.8 % (ref 3.0–12.0)
Neutro Abs: 3.5 10*3/uL (ref 1.4–7.7)
Platelets: 172 10*3/uL (ref 150.0–400.0)
RDW: 14.3 % (ref 11.5–14.6)

## 2011-10-01 LAB — BASIC METABOLIC PANEL
BUN: 13 mg/dL (ref 6–23)
Calcium: 9.2 mg/dL (ref 8.4–10.5)
Chloride: 104 mEq/L (ref 96–112)
Creatinine, Ser: 1 mg/dL (ref 0.4–1.5)
GFR: 100.42 mL/min (ref >60.00)

## 2011-10-01 LAB — URINALYSIS, ROUTINE W REFLEX MICROSCOPIC
Bilirubin Urine: NEGATIVE
Hgb urine dipstick: NEGATIVE
Ketones, ur: NEGATIVE
Leukocytes, UA: NEGATIVE
Urine Glucose: NEGATIVE
Urobilinogen, UA: 0.2 (ref 0.0–1.0)

## 2011-10-01 LAB — TSH: TSH: 1.32 u[IU]/mL (ref 0.35–5.50)

## 2011-10-01 LAB — HEPATIC FUNCTION PANEL
AST: 39 U/L — ABNORMAL HIGH (ref 0–37)
Albumin: 4.1 g/dL (ref 3.5–5.2)
Alkaline Phosphatase: 73 U/L (ref 39–117)
Bilirubin, Direct: 0.1 mg/dL (ref 0.0–0.3)
Total Protein: 6.7 g/dL (ref 6.0–8.3)

## 2011-10-02 LAB — TESTOSTERONE, FREE, TOTAL, SHBG
Sex Hormone Binding: 34 nmol/L (ref 13–71)
Testosterone-% Free: 1.8 % (ref 1.6–2.9)
Testosterone: 167.65 ng/dL — ABNORMAL LOW (ref 300–890)

## 2011-10-09 ENCOUNTER — Ambulatory Visit (INDEPENDENT_AMBULATORY_CARE_PROVIDER_SITE_OTHER): Payer: BC Managed Care – PPO | Admitting: Internal Medicine

## 2011-10-09 ENCOUNTER — Encounter: Payer: Self-pay | Admitting: Internal Medicine

## 2011-10-09 VITALS — BP 110/62 | HR 78 | Temp 97.6°F | Ht 69.5 in | Wt 237.4 lb

## 2011-10-09 DIAGNOSIS — R7302 Impaired glucose tolerance (oral): Secondary | ICD-10-CM

## 2011-10-09 DIAGNOSIS — E291 Testicular hypofunction: Secondary | ICD-10-CM

## 2011-10-09 DIAGNOSIS — F528 Other sexual dysfunction not due to a substance or known physiological condition: Secondary | ICD-10-CM

## 2011-10-09 DIAGNOSIS — R7309 Other abnormal glucose: Secondary | ICD-10-CM

## 2011-10-09 DIAGNOSIS — Z0000 Encounter for general adult medical examination without abnormal findings: Secondary | ICD-10-CM

## 2011-10-09 DIAGNOSIS — E7800 Pure hypercholesterolemia, unspecified: Secondary | ICD-10-CM

## 2011-10-09 HISTORY — DX: Testicular hypofunction: E29.1

## 2011-10-09 MED ORDER — VARDENAFIL HCL 20 MG PO TABS: 20.0000 mg | ORAL_TABLET | ORAL | Status: DC | PRN

## 2011-10-09 MED ORDER — TESTOSTERONE 50 MG/5GM (1%) TD GEL: 5.0000 g | Freq: Every day | TRANSDERMAL | Status: DC

## 2011-10-09 MED ORDER — EZETIMIBE 10 MG PO TABS: 10.0000 mg | ORAL_TABLET | Freq: Every day | ORAL | Status: DC

## 2011-10-09 NOTE — Assessment & Plan Note (Signed)
Ok for levetira prn,  to f/u any worsening symptoms or concerns

## 2011-10-09 NOTE — Assessment & Plan Note (Signed)
With goal ldl < 70, and already on high dose crestor 40, will add zetia 10 qd,  to f/u any worsening symptoms or concerns

## 2011-10-09 NOTE — Assessment & Plan Note (Signed)

## 2011-10-09 NOTE — Assessment & Plan Note (Signed)
stable overall by hx and exam, most recent data reviewed with pt, and pt to continue medical treatment as before Lab Results  Component Value Date   HGBA1C 6.4 10/01/2011

## 2011-10-09 NOTE — Assessment & Plan Note (Signed)
To start androgel asd, f/u lab in 4 wks,  to f/u any worsening symptoms or concerns

## 2011-10-09 NOTE — Patient Instructions (Addendum)
Take all new medications as prescribed - the androgel, the zetia., and levitra Continue all other medications as before You are up to date with prevention at this time Please return for LAB only at 4 weeks after starting the testosterone treatment, for testosterone lab and cholesterol Please return in 1 year for your yearly visit, or sooner if needed, with Lab testing done 3-5 days before (the full labs)

## 2011-10-09 NOTE — Progress Notes (Signed)
Subjective:    Patient ID: Warren Weeks, male    DOB: [birth year], 66 y.o.   MRN: [id number]  HPI  Here for wellness and f/u;  Overall doing ok;  Pt denies CP, worsening SOB, DOE, wheezing, orthopnea, PND, worsening LE edema, palpitations, dizziness or syncope.  Pt denies neurological change such as new Headache, facial or extremity weakness.  Pt denies polydipsia, polyuria, or low sugar symptoms. Pt states overall good compliance with treatment and medications, good tolerability, and trying to follow lower cholesterol diet.  Pt denies worsening depressive symptoms, suicidal ideation or panic. No fever, wt loss, night sweats, loss of appetite, or other constitutional symptoms.  Pt states good ability with ADL's, low fall risk, home safety reviewed and adequate, no significant changes in hearing or vision, and occasionally active with exercise.  Does have ongoing ED symptoms worse over the past 6 mo, asks for tx, has taken cialis in the past but expensive.  Also has low testosterone and is interested in re-starting tx, was tx with testim several yrs ago per urology, did not feel much different and too expensive, so stopped.  Past Medical History  Diagnosis Date  . GLUCOSE INTOLERANCE 09/16/2007  . HYPERCHOLESTEROLEMIA 03/24/2007  . HYPERLIPIDEMIA 09/16/2007  . OBESITY 03/24/2007  . ANXIETY 03/28/2007  . ERECTILE DYSFUNCTION 03/28/2007  . DEPRESSION 03/28/2007  . OTITIS MEDIA, LEFT 06/10/2009  . HYPERTENSION 03/28/2007  . CAROTID ARTERY STENOSIS, BILATERAL 07/01/2009  . PVD 06/25/2009  . HEMORRHOIDS, INTERNAL 03/24/2007  . URI 08/16/2009  . ALLERGIC RHINITIS 09/16/2007  . ESOPHAGITIS 03/24/2007  . ESOPHAGEAL STRICTURE 09/16/2007  . GERD 03/24/2007  . LOW BACK PAIN 03/28/2007  . Dizziness and giddiness 06/08/2009  . RASH-NONVESICULAR 06/17/2009  . CEPHALGIA 06/08/2009  . DYSPNEA/SHORTNESS OF BREATH 09/16/2007  . CHEST PAIN 09/16/2007  . MAGNETIC RESONANCE IMAGING, BRAIN, ABNORMAL 06/26/2009  . ALLERGY  03/24/2007  . COLONIC POLYPS, HX OF 09/16/2007  . NECK PAIN, LEFT 06/20/2010  . Palpitations 06/20/2010  . Impaired glucose tolerance 10/04/2010  . 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 Tonsillectomy   . S/p right knee arthroscopy     reports that he has quit smoking. He does not have any smokeless tobacco history on file. He reports that he drinks alcohol. He reports that he does not use illicit drugs. family history includes Coronary artery disease in his brother and sister; Heart disease in his father; Lung cancer in his sister; Lymphoma in his mother; and Ovarian cancer in his sister. Allergies  Allergen Reactions  . Cefuroxime Axetil    Current Outpatient Prescriptions on File Prior to Visit  Medication Sig Dispense Refill  . aspirin (ECOTRIN LOW STRENGTH) 81 MG EC tablet Take 81 mg by mouth daily.        . colchicine (COLCRYS) 0.6 MG tablet Take 0.6 mg by mouth as needed.        . cyclobenzaprine (FLEXERIL) 5 MG tablet Take 1 tablet (5 mg total) by mouth 3 (three) times daily as needed.  90 tablet  3  . diclofenac (VOLTAREN) 75 MG EC tablet Take 1 tablet (75 mg total) by mouth 2 (two) times daily.  180 tablet  3  . lisinopril (PRINIVIL,ZESTRIL) 20 MG tablet Take 1 tablet (20 mg total) by mouth daily.  90 tablet  3  . meclizine (ANTIVERT) 12.5 MG tablet Take 12.5 mg by mouth. 1 - 2 by mouth every 6 hours as needed       .  mometasone (NASONEX) 50 MCG/ACT nasal spray 2 sprays by Nasal route daily.        Marland Kitchen omeprazole (PRILOSEC) 20 MG capsule TAKE TWO CAPSULES BY MOUTH EVERY DAY  180 capsule  3  . rosuvastatin (CRESTOR) 40 MG tablet Take 1 tablet (40 mg total) by mouth daily.  90 tablet  3  . sertraline (ZOLOFT) 50 MG tablet Take 1 tablet (50 mg total) by mouth daily.  90 tablet  3  . vitamin E 600 UNIT capsule Take 600 Units by mouth daily.        Marland Kitchen ezetimibe (ZETIA) 10 MG tablet Take 1 tablet (10 mg total) by mouth daily.  90 tablet  3  . testosterone  (ANDROGEL) 50 MG/5GM GEL Place 5 g onto the skin daily.  30 g  5  . vardenafil (LEVITRA) 20 MG tablet Take 1 tablet (20 mg total) by mouth as needed for erectile dysfunction.  5 tablet  11   Review of Systems Review of Systems  Constitutional: Negative for diaphoresis, activity change, appetite change and unexpected weight change.  HENT: Negative for hearing loss, ear pain, facial swelling, mouth sores and neck stiffness.   Eyes: Negative for pain, redness and visual disturbance.  Respiratory: Negative for shortness of breath and wheezing.   Cardiovascular: Negative for chest pain and palpitations.  Gastrointestinal: Negative for diarrhea, blood in stool, abdominal distention and rectal pain.  Genitourinary: Negative for hematuria, flank pain and decreased urine volume.  Musculoskeletal: Negative for myalgias and joint swelling.  Skin: Negative for color change and wound.  Neurological: Negative for syncope and numbness.  Hematological: Negative for adenopathy.  Psychiatric/Behavioral: Negative for hallucinations, self-injury, decreased concentration and agitation.      Objective:   Physical Exam BP 110/62  Pulse 78  Temp(Src) 97.6 F (36.4 C) (Oral)  Ht 5' 9.5" (1.765 m)  Wt 237 lb 6.4 oz (107.684 kg)  BMI 34.56 kg/m2  SpO2 95% Physical Exam  VS noted Constitutional: Pt is oriented to person, place, and time. Appears well-developed and well-nourished. Warren Weeks Head: Normocephalic and atraumatic.  Right Ear: External ear normal.  Left Ear: External ear normal.  Nose: Nose normal.  Mouth/Throat: Oropharynx is clear and moist.  Eyes: Conjunctivae and EOM are normal. Pupils are equal, round, and reactive to light.  Neck: Normal range of motion. Neck supple. No JVD present. No tracheal deviation present.  Cardiovascular: Normal rate, regular rhythm, normal heart sounds and intact distal pulses.   Pulmonary/Chest: Effort normal and breath sounds normal.  Abdominal: Soft. Bowel sounds  are normal. There is no tenderness.  Musculoskeletal: Normal range of motion. Exhibits no edema.  Lymphadenopathy:  Has no cervical adenopathy.  Neurological: Pt is alert and oriented to person, place, and time. Pt has normal reflexes. No cranial nerve deficit.  Skin: Skin is warm and dry. No rash noted.  Psychiatric:  Has  normal mood and affect. Behavior is normal.     Assessment & Plan:

## 2011-10-12 ENCOUNTER — Telehealth: Payer: Self-pay

## 2011-10-12 MED ORDER — TESTOSTERONE 40.5 MG/2.5GM (1.62%) TD GEL: 1.0000 "application " | Freq: Every day | TRANSDERMAL | Status: DC

## 2011-10-12 NOTE — Telephone Encounter (Signed)
testoseterone Done hardcopy to robin  Unfortunately there simply is no generic and no medication even similar to zetia;  I would really have no other options if he is unable to take the zetia

## 2011-10-12 NOTE — Telephone Encounter (Signed)
Called the patient informed of MD's information on Zetia, faxed hardcopy of testosterone to pharmacy.

## 2011-10-12 NOTE — Telephone Encounter (Signed)
Pt called stating that Zetia is too expensive. Pt is requesting an alternative. Pt is also requesting Androgel Rx be changed to 1.62% because he has a discount card that can only be used for this dosage.

## 2011-10-12 NOTE — Telephone Encounter (Signed)
Called left message to call back 

## 2011-11-07 ENCOUNTER — Other Ambulatory Visit: Payer: Self-pay | Admitting: Internal Medicine

## 2011-11-10 ENCOUNTER — Telehealth: Payer: Self-pay

## 2011-11-10 NOTE — Telephone Encounter (Signed)
Faxed to [employer] form for Androgel 1.62%. Awaiting approval.

## 2011-11-17 ENCOUNTER — Other Ambulatory Visit: Payer: Self-pay | Admitting: Internal Medicine

## 2011-11-18 NOTE — Telephone Encounter (Signed)
Received approval for Medication today from [facility].  Approved from 10/19/2011 through 11/16/2016. Faxed approval letter to pharmacy and called the patient informed of approval. Left message to call back

## 2011-11-18 NOTE — Telephone Encounter (Signed)
Called the patient and informed of approval.

## 2011-11-25 ENCOUNTER — Telehealth: Payer: Self-pay | Admitting: *Deleted

## 2011-11-25 MED ORDER — VARDENAFIL HCL 20 MG PO TABS: 20.0000 mg | ORAL_TABLET | ORAL | Status: DC | PRN

## 2011-11-25 NOTE — Telephone Encounter (Signed)
Left msg on vm had cpx back in April MD suppose to have sent renewal on his levitra. Pharmacy states they never received. Requesting a call back... 11/25/11@2 :07pm/LMB

## 2012-01-05 ENCOUNTER — Ambulatory Visit (INDEPENDENT_AMBULATORY_CARE_PROVIDER_SITE_OTHER): Payer: BC Managed Care – PPO | Admitting: Internal Medicine

## 2012-01-05 ENCOUNTER — Encounter: Payer: Self-pay | Admitting: Internal Medicine

## 2012-01-05 VITALS — BP 140/72 | HR 68 | Ht 69.5 in | Wt 233.0 lb

## 2012-01-05 DIAGNOSIS — R002 Palpitations: Secondary | ICD-10-CM

## 2012-01-05 DIAGNOSIS — I10 Essential (primary) hypertension: Secondary | ICD-10-CM

## 2012-01-05 DIAGNOSIS — I6529 Occlusion and stenosis of unspecified carotid artery: Secondary | ICD-10-CM

## 2012-01-05 NOTE — Assessment & Plan Note (Signed)
These appear to be well-controlled. No additional workup at this time.

## 2012-01-05 NOTE — Assessment & Plan Note (Signed)
We will repeat his carotid ultrasound in several months. Additional followup will depend on the severity of his carotid stenosis.

## 2012-01-05 NOTE — Progress Notes (Signed)
HPI Mr. Carignan returns today for followup. He is a very pleasant 66 year old man with a history of palpitations with documented PACs, PVCs and sinus rhythm on cardiac monitoring. 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 In the interim, he has done well. He has rare palpitations. He denies chest pain or shortness of breath. He does have dyslipidemia and has had his dose of cholesterol lowering medications uptitrated. Allergies  Allergen Reactions  . Cefuroxime Axetil      Current Outpatient Prescriptions  Medication Sig Dispense Refill  . aspirin (ECOTRIN LOW STRENGTH) 81 MG EC tablet Take 81 mg by mouth daily.        . cyclobenzaprine (FLEXERIL) 5 MG tablet Take 1 tablet (5 mg total) by mouth 3 (three) times daily as needed.  90 tablet  3  . diclofenac (VOLTAREN) 75 MG EC tablet       . ezetimibe (ZETIA) 10 MG tablet Take 1 tablet (10 mg total) by mouth daily.  90 tablet  3  . fish oil-omega-3 fatty acids 1000 MG capsule Take 1,200 mg by mouth daily.      Marland Kitchen lisinopril (PRINIVIL,ZESTRIL) 20 MG tablet Take 1 tablet (20 mg total) by mouth daily.  90 tablet  3  . omeprazole (PRILOSEC) 20 MG capsule TAKE TWO CAPSULES BY MOUTH EVERY DAY  180 capsule  3  . Testosterone 40.5 MG/2.5GM (1.62%) GEL Place 1 application onto the skin daily.  2.5 g  5  . vitamin E 600 UNIT capsule Take 600 Units by mouth daily.        . rosuvastatin (CRESTOR) 40 MG tablet Take 1 tablet (40 mg total) by mouth daily.  90 tablet  3  . vardenafil (LEVITRA) 20 MG tablet Take 1 tablet (20 mg total) by mouth as needed for erectile dysfunction.  5 tablet  9     Past Medical History  Diagnosis Date  . GLUCOSE INTOLERANCE 09/16/2007  . HYPERCHOLESTEROLEMIA 03/24/2007  . HYPERLIPIDEMIA 09/16/2007  . OBESITY 03/24/2007  . ANXIETY 03/28/2007  . ERECTILE DYSFUNCTION 03/28/2007  . DEPRESSION 03/28/2007  . OTITIS MEDIA, LEFT 06/10/2009  . HYPERTENSION 03/28/2007  . CAROTID ARTERY STENOSIS, BILATERAL 07/01/2009  .  PVD 06/25/2009  . HEMORRHOIDS, INTERNAL 03/24/2007  . URI 08/16/2009  . ALLERGIC RHINITIS 09/16/2007  . ESOPHAGITIS 03/24/2007  . ESOPHAGEAL STRICTURE 09/16/2007  . GERD 03/24/2007  . LOW BACK PAIN 03/28/2007  . Dizziness and giddiness 06/08/2009  . RASH-NONVESICULAR 06/17/2009  . CEPHALGIA 06/08/2009  . DYSPNEA/SHORTNESS OF BREATH 09/16/2007  . CHEST PAIN 09/16/2007  . MAGNETIC RESONANCE IMAGING, BRAIN, ABNORMAL 06/26/2009  . ALLERGY 03/24/2007  . COLONIC POLYPS, HX OF 09/16/2007  . NECK PAIN, LEFT 06/20/2010  . Palpitations 06/20/2010  . Impaired glucose tolerance 10/04/2010  . 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   Past Surgical History  Procedure Date  . Tonsillectomy   . S/p right knee arthroscopy      Family History  Problem Relation Age of Onset  . Lymphoma Mother   . Heart disease Father     CHF  . Ovarian cancer Sister   . Lung cancer Sister   . Coronary artery disease Sister     CABG  . Coronary artery disease Brother     stent, and CAD     History   Social History  . Marital Status: Married    Spouse Name: N/A  Number of Children: 2  . Years of Education: N/A   Occupational History  . ship and receive manager    Social History Main Topics  . Smoking status: Former Games developer  . Smokeless tobacco: Never Used  . Alcohol Use: Yes  . Drug Use: No  . Sexually Active: Not on file   Other Topics Concern  . Not on file   Social History Narrative  . No narrative on file     BP 140/72  Pulse 68  Ht 5' 9.5" (1.765 m)  Wt 233 lb (105.688 kg)  BMI 33.91 kg/m2  Physical Exam:  Well appearing middle-aged man, NAD HEENT: Unremarkable Neck:  No JVD, no thyromegally, faint carotid bruit. Lungs:  Clear with no wheezes, rales, rhonchi. HEART:  Regular rate rhythm, no murmurs, no rubs, no clicks Abd:  soft, positive bowel sounds, no organomegally, no rebound, no guarding Ext:  2 plus pulses, no  edema, no cyanosis, no clubbing Skin:  No rashes no nodules Neuro:  CN II through XII intact, motor grossly intact  EKG Normal sinus rhythm  Assess/Plan:

## 2012-01-05 NOTE — Patient Instructions (Signed)
Your physician wants you to follow-up in: 12 months with Dr Court Joy will receive a reminder letter in the mail two months in advance. If you don't receive a letter, please call our office to schedule the follow-up appointment.   Your physician has requested that you have a carotid duplex. This test is an ultrasound of the carotid arteries in your neck. It looks at blood flow through these arteries that supply the brain with blood. Allow one hour for this exam. There are no restrictions or special instructions.

## 2012-01-05 NOTE — Assessment & Plan Note (Signed)
I've discussed the importance of maintaining a low-sodium diet and keep his blood pressure control.

## 2012-03-09 ENCOUNTER — Telehealth: Payer: Self-pay | Admitting: Internal Medicine

## 2012-03-09 NOTE — Telephone Encounter (Signed)
Patient needs a prior auth for his nexium, needs [place] to call [phone] to obtain auth

## 2012-03-11 MED ORDER — OMEPRAZOLE 20 MG PO CPDR: 40.0000 mg | DELAYED_RELEASE_CAPSULE | Freq: Every day | ORAL | Status: DC

## 2012-03-11 NOTE — Telephone Encounter (Signed)
Received PA request for nexium per pt. Please advise to proceed or offer alternative

## 2012-03-11 NOTE — Telephone Encounter (Signed)
Please to try change to omeprazole 20 mg - 2 per day - done erx already on Jul 28, 2011, and let pt know (done erx again)

## 2012-03-25 ENCOUNTER — Telehealth: Payer: Self-pay

## 2012-03-25 NOTE — Telephone Encounter (Signed)
Received PA approval for Omeprazole approved from 03/04/12 through 03/25/13.  Pharmacy informed.

## 2012-04-20 ENCOUNTER — Other Ambulatory Visit: Payer: Self-pay | Admitting: Internal Medicine

## 2012-04-25 ENCOUNTER — Other Ambulatory Visit: Payer: Self-pay | Admitting: Internal Medicine

## 2012-05-26 ENCOUNTER — Ambulatory Visit (INDEPENDENT_AMBULATORY_CARE_PROVIDER_SITE_OTHER): Payer: BC Managed Care – PPO | Admitting: Internal Medicine

## 2012-05-26 ENCOUNTER — Encounter: Payer: Self-pay | Admitting: Internal Medicine

## 2012-05-26 ENCOUNTER — Other Ambulatory Visit (INDEPENDENT_AMBULATORY_CARE_PROVIDER_SITE_OTHER): Payer: BC Managed Care – PPO

## 2012-05-26 VITALS — BP 102/68 | HR 107 | Temp 97.2°F | Ht 70.0 in | Wt 233.2 lb

## 2012-05-26 DIAGNOSIS — B9789 Other viral agents as the cause of diseases classified elsewhere: Secondary | ICD-10-CM

## 2012-05-26 DIAGNOSIS — R7302 Impaired glucose tolerance (oral): Secondary | ICD-10-CM

## 2012-05-26 DIAGNOSIS — R7309 Other abnormal glucose: Secondary | ICD-10-CM

## 2012-05-26 DIAGNOSIS — I10 Essential (primary) hypertension: Secondary | ICD-10-CM

## 2012-05-26 DIAGNOSIS — R109 Unspecified abdominal pain: Secondary | ICD-10-CM

## 2012-05-26 DIAGNOSIS — B349 Viral infection, unspecified: Secondary | ICD-10-CM

## 2012-05-26 LAB — CBC WITH DIFFERENTIAL/PLATELET
Basophils Relative: 0.1 % (ref 0.0–3.0)
Eosinophils Absolute: 0 10*3/uL (ref 0.0–0.7)
Eosinophils Relative: 0.1 % (ref 0.0–5.0)
HCT: 45.3 % (ref 39.0–52.0)
Lymphs Abs: 0.5 10*3/uL — ABNORMAL LOW (ref 0.7–4.0)
MCHC: 33.3 g/dL (ref 30.0–36.0)
MCV: 90.1 fl (ref 78.0–100.0)
Monocytes Absolute: 0.2 10*3/uL (ref 0.1–1.0)
Neutro Abs: 5.8 10*3/uL (ref 1.4–7.7)
Neutrophils Relative %: 88.4 % — ABNORMAL HIGH (ref 43.0–77.0)
RBC: 5.03 Mil/uL (ref 4.22–5.81)
WBC: 6.6 10*3/uL (ref 4.5–10.5)

## 2012-05-26 LAB — URINALYSIS, ROUTINE W REFLEX MICROSCOPIC
Bilirubin Urine: NEGATIVE
Hgb urine dipstick: NEGATIVE
Ketones, ur: 15
Total Protein, Urine: NEGATIVE
Urine Glucose: NEGATIVE
Urobilinogen, UA: 0.2 (ref 0.0–1.0)

## 2012-05-26 LAB — HEPATIC FUNCTION PANEL
ALT: 51 U/L (ref 0–53)
AST: 37 U/L (ref 0–37)
Alkaline Phosphatase: 67 U/L (ref 39–117)

## 2012-05-26 LAB — LIPASE: Lipase: 18 U/L (ref 11.0–59.0)

## 2012-05-26 LAB — BASIC METABOLIC PANEL
CO2: 28 mEq/L (ref 19–32)
Calcium: 9 mg/dL (ref 8.4–10.5)
Glucose, Bld: 105 mg/dL — ABNORMAL HIGH (ref 70–99)
Sodium: 142 mEq/L (ref 135–145)

## 2012-05-26 MED ORDER — PROMETHAZINE HCL 25 MG PO TABS: 25.0000 mg | ORAL_TABLET | Freq: Four times a day (QID) | ORAL | Status: DC | PRN

## 2012-05-26 MED ORDER — DIPHENOXYLATE-ATROPINE 2.5-0.025 MG PO TABS: 1.0000 | ORAL_TABLET | Freq: Four times a day (QID) | ORAL | Status: DC | PRN

## 2012-05-26 MED ORDER — SILDENAFIL CITRATE 100 MG PO TABS: 50.0000 mg | ORAL_TABLET | Freq: Every day | ORAL | Status: DC | PRN

## 2012-05-26 MED ORDER — PROMETHAZINE HCL 25 MG RE SUPP: 25.0000 mg | Freq: Four times a day (QID) | RECTAL | Status: DC | PRN

## 2012-05-26 NOTE — Patient Instructions (Addendum)
Take all new medications as prescribed - the phenergan pill, or phenergan suppository as you can for the nausea; as well as the lomotil as needed for the diarrhea Please drink as much clear fluids over the next week as you can (keep a cup nearby with sips all the time) Try the BRAT diet (a bland diet) for food as we discussed Please hold on taking the lisinopril for 1 week as long as you are not able to eat or drink well OK to stop the levitra as you have You are given the Viagra prescription with the coupon; if this works better, please call for a prescription Continue all other medications as before Remember, you may lose several lbs in the next week with the illness, but almost everyone gains the weight back after that Please go to LAB in the Basement for the blood and/or urine tests to be done today You will be contacted by phone if any changes need to be made immediately.  Otherwise, you will receive a letter about your results with an explanation, but please check with MyChart first. Thank you for enrolling in MyChart. Please follow the instructions below to securely access your online medical record. MyChart allows you to send messages to your doctor, view your test results, renew your prescriptions, schedule appointments, and more Please remember to sign up for My Chart at your earliest convenience, as this will be important to you in the future with finding out test results. You are given the work note

## 2012-05-26 NOTE — Assessment & Plan Note (Signed)
Most c/w AGE, see above, for labs

## 2012-05-26 NOTE — Progress Notes (Signed)
Subjective:    Patient ID: Warren Weeks, male    DOB: September 30, 1945, 66 y.o.   MRN: [id number]  HPI  Her with 1-2 days acute onset feverish, HA, mild ST but marked n/v, abd pain crampy with mult loose and watery stools since yesterday am,  general weakness and malaise, but little to no cough and Pt denies chest pain, increased sob or doe, wheezing, orthopnea, PND, increased LE swelling, palpitations, dizziness or syncope.  Pt denies new neurological symptoms such as new headache, or facial or extremity weakness or numbness   Pt denies polydipsia, polyuria. BP mild low today as well, unsual for him Past Medical History  Diagnosis Date  . GLUCOSE INTOLERANCE 09/16/2007  . HYPERCHOLESTEROLEMIA 03/24/2007  . HYPERLIPIDEMIA 09/16/2007  . OBESITY 03/24/2007  . ANXIETY 03/28/2007  . ERECTILE DYSFUNCTION 03/28/2007  . DEPRESSION 03/28/2007  . OTITIS MEDIA, LEFT 06/10/2009  . HYPERTENSION 03/28/2007  . CAROTID ARTERY STENOSIS, BILATERAL 07/01/2009  . PVD 06/25/2009  . HEMORRHOIDS, INTERNAL 03/24/2007  . URI 08/16/2009  . ALLERGIC RHINITIS 09/16/2007  . ESOPHAGITIS 03/24/2007  . ESOPHAGEAL STRICTURE 09/16/2007  . GERD 03/24/2007  . LOW BACK PAIN 03/28/2007  . Dizziness and giddiness 06/08/2009  . RASH-NONVESICULAR 06/17/2009  . CEPHALGIA 06/08/2009  . DYSPNEA/SHORTNESS OF BREATH 09/16/2007  . CHEST PAIN 09/16/2007  . MAGNETIC RESONANCE IMAGING, BRAIN, ABNORMAL 06/26/2009  . ALLERGY 03/24/2007  . COLONIC POLYPS, HX OF 09/16/2007  . NECK PAIN, LEFT 06/20/2010  . Palpitations 06/20/2010  . Impaired glucose tolerance 10/04/2010  . 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 Tonsillectomy   . S/p right knee arthroscopy     reports that he has quit smoking. He has never used smokeless tobacco. He reports that he drinks alcohol. He reports that he does not use illicit drugs. family history includes Coronary artery disease in his brother and sister; Heart disease in his father; Lung cancer  in his sister; Lymphoma in his mother; and Ovarian cancer in his sister. Allergies  Allergen Reactions  . Cefuroxime Axetil    Current Outpatient Prescriptions on File Prior to Visit  Medication Sig Dispense Refill  . ANDROGEL PUMP 20.25 MG/ACT (1.62%) GEL APPLY ONCE TOPICALLY DAILY  75 g  4  . aspirin (ECOTRIN LOW STRENGTH) 81 MG EC tablet Take 81 mg by mouth daily.        . CRESTOR 40 MG tablet TAKE ONE TABLET BY MOUTH EVERY DAY  90 tablet  2  . cyclobenzaprine (FLEXERIL) 5 MG tablet Take 1 tablet (5 mg total) by mouth 3 (three) times daily as needed.  90 tablet  3  . diclofenac (VOLTAREN) 75 MG EC tablet       . ezetimibe (ZETIA) 10 MG tablet Take 1 tablet (10 mg total) by mouth daily.  90 tablet  3  . fish oil-omega-3 fatty acids 1000 MG capsule Take 1,200 mg by mouth daily.      Marland Kitchen lisinopril (PRINIVIL,ZESTRIL) 20 MG tablet TAKE ONE TABLET BY MOUTH EVERY DAY  90 tablet  1  . omeprazole (PRILOSEC) 20 MG capsule Take 2 capsules (40 mg total) by mouth daily.  180 capsule  3  . Testosterone 40.5 MG/2.5GM (1.62%) GEL Place 1 application onto the skin daily.  2.5 g  5  . vitamin E 600 UNIT capsule Take 600 Units by mouth daily.        . promethazine (PHENERGAN) 25 MG suppository Place 1 suppository (25 mg total) rectally every 6 (  six) hours as needed for nausea.  12 each  1  . sildenafil (VIAGRA) 100 MG tablet Take 0.5-1 tablets (50-100 mg total) by mouth daily as needed for erectile dysfunction.  3 tablet  0  . vardenafil (LEVITRA) 20 MG tablet Take 1 tablet (20 mg total) by mouth as needed for erectile dysfunction.  5 tablet  9   Review of Systems  Constitutional: Negative for diaphoresis and unexpected weight change.  HENT: Negative for tinnitus.   Eyes: Negative for photophobia and visual disturbance.  Respiratory: Negative for choking and stridor.   Gastrointestinal: Negative for blood in stool.  Genitourinary: Negative for hematuria and decreased urine volume.  Musculoskeletal:  Negative for gait problem.  Skin: Negative for color change and wound.  Neurological: Negative for tremors and numbness.  Psychiatric/Behavioral: Negative for decreased concentration. The patient is not hyperactive.       Objective:   Physical Exam BP 102/68  Pulse 107  Temp 97.2 F (36.2 C) (Oral)  Ht 5\' 10"  (1.778 m)  Wt 233 lb 4 oz (105.802 kg)  BMI 33.47 kg/m2  SpO2 96% Physical Exam  VS noted, mild ill Constitutional: Pt appears well-developed and well-nourished.  HENT: Head: Normocephalic.  Right Ear: External ear normal.  Left Ear: External ear normal.  Bilat tm's mild erythema.  Sinus nontender.  Pharynx mild erythema Eyes: Conjunctivae and EOM are normal. Pupils are equal, round, and reactive to light.  Neck: Normal range of motion. Neck supple.  Cardiovascular: Normal rate and regular rhythm.   Pulmonary/Chest: Effort normal and breath sounds normal.  Abd:  Soft,, non-distended, + BS, mild diffuse tender, no guarding or rebound Neurological: Pt is alert. Not confused  Skin: Skin is warm. No erythema.  Psychiatric: Pt behavior is normal. Thought content normal.     Assessment & Plan:

## 2012-05-26 NOTE — Assessment & Plan Note (Signed)
Ok to hold ACE for 1 wk during acute illness

## 2012-05-26 NOTE — Assessment & Plan Note (Signed)
Asympt, pt to call for onset polys or cbg > 200 

## 2012-05-26 NOTE — Assessment & Plan Note (Signed)
Hx and exam c/w acute gastroenteritis, for antiemetic tx, lomotil prn, and labs today to assess wbc and cr, though doubt bacterial colitis,  to f/u any worsening symptoms or concerns

## 2012-05-27 ENCOUNTER — Encounter: Payer: Self-pay | Admitting: Internal Medicine

## 2012-05-27 LAB — SEDIMENTATION RATE: Sed Rate: 22 mm/hr (ref 0–22)

## 2012-07-23 ENCOUNTER — Other Ambulatory Visit: Payer: Self-pay | Admitting: Internal Medicine

## 2012-07-25 ENCOUNTER — Telehealth: Payer: Self-pay

## 2012-07-25 MED ORDER — LISINOPRIL 20 MG PO TABS: 20.0000 mg | ORAL_TABLET | Freq: Every day | ORAL | Status: DC

## 2012-07-25 NOTE — Telephone Encounter (Signed)
rx filled

## 2012-08-01 ENCOUNTER — Ambulatory Visit (INDEPENDENT_AMBULATORY_CARE_PROVIDER_SITE_OTHER): Payer: BC Managed Care – PPO | Admitting: Internal Medicine

## 2012-08-01 ENCOUNTER — Encounter: Payer: Self-pay | Admitting: Internal Medicine

## 2012-08-01 VITALS — BP 136/84 | HR 94 | Temp 97.9°F | Ht 70.0 in | Wt 235.6 lb

## 2012-08-01 DIAGNOSIS — R51 Headache: Secondary | ICD-10-CM

## 2012-08-01 DIAGNOSIS — I10 Essential (primary) hypertension: Secondary | ICD-10-CM

## 2012-08-01 MED ORDER — METHYLPREDNISOLONE ACETATE 80 MG/ML IJ SUSP
80.0000 mg | Freq: Once | INTRAMUSCULAR | Status: AC
  Administered 2012-08-01: 80 mg via INTRAMUSCULAR

## 2012-08-01 NOTE — Progress Notes (Signed)
Subjective:    Patient ID: Warren Weeks, male    DOB: 1946/04/01, 67 y.o.   MRN: [id number]  HPI  Pt presents to the clinic today with c/o elevated blood pressure. This started 4 days ago. He has been checking his blood pressure at the pharmacy. IT has been running 150-170/ 90. He does c/o headache today. The pain is 5/10 and constant. He does take Lisinopril 20 mg daily. He tries not to consume salt in his diet. He has had a headache like that in the past. HE has not taken anything for the pain. He denies blurred vision, abnormal bleeding or chest pain.  Review of Systems      Past Medical History  Diagnosis Date  . GLUCOSE INTOLERANCE 09/16/2007  . HYPERCHOLESTEROLEMIA 03/24/2007  . HYPERLIPIDEMIA 09/16/2007  . OBESITY 03/24/2007  . ANXIETY 03/28/2007  . ERECTILE DYSFUNCTION 03/28/2007  . DEPRESSION 03/28/2007  . OTITIS MEDIA, LEFT 06/10/2009  . HYPERTENSION 03/28/2007  . CAROTID ARTERY STENOSIS, BILATERAL 07/01/2009  . PVD 06/25/2009  . HEMORRHOIDS, INTERNAL 03/24/2007  . URI 08/16/2009  . ALLERGIC RHINITIS 09/16/2007  . ESOPHAGITIS 03/24/2007  . ESOPHAGEAL STRICTURE 09/16/2007  . GERD 03/24/2007  . LOW BACK PAIN 03/28/2007  . Dizziness and giddiness 06/08/2009  . RASH-NONVESICULAR 06/17/2009  . CEPHALGIA 06/08/2009  . DYSPNEA/SHORTNESS OF BREATH 09/16/2007  . CHEST PAIN 09/16/2007  . MAGNETIC RESONANCE IMAGING, BRAIN, ABNORMAL 06/26/2009  . ALLERGY 03/24/2007  . COLONIC POLYPS, HX OF 09/16/2007  . NECK PAIN, LEFT 06/20/2010  . Palpitations 06/20/2010  . Impaired glucose tolerance 10/04/2010  . 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 ANDROGEL PUMP 20.25 MG/ACT (1.62%) GEL APPLY ONCE TOPICALLY DAILY  75 g  4  . aspirin (ECOTRIN LOW STRENGTH) 81 MG EC tablet Take 81 mg by mouth daily.        . CRESTOR 40 MG tablet TAKE ONE TABLET BY MOUTH EVERY DAY  90 tablet  2  . cyclobenzaprine (FLEXERIL) 5 MG tablet Take 1 tablet (5 mg total)  by mouth 3 (three) times daily as needed.  90 tablet  3  . diclofenac (VOLTAREN) 75 MG EC tablet       . diphenoxylate-atropine (LOMOTIL) 2.5-0.025 MG per tablet Take 1 tablet by mouth 4 (four) times daily as needed for diarrhea or loose stools.  40 tablet  1  . ezetimibe (ZETIA) 10 MG tablet Take 1 tablet (10 mg total) by mouth daily.  90 tablet  3  . fish oil-omega-3 fatty acids 1000 MG capsule Take 1,200 mg by mouth daily.      Marland Kitchen lisinopril (PRINIVIL,ZESTRIL) 20 MG tablet Take 1 tablet (20 mg total) by mouth daily.  90 tablet  3  . omeprazole (PRILOSEC) 20 MG capsule Take 2 capsules (40 mg total) by mouth daily.  180 capsule  3  . promethazine (PHENERGAN) 25 MG suppository Place 1 suppository (25 mg total) rectally every 6 (six) hours as needed for nausea.  12 each  1  . promethazine (PHENERGAN) 25 MG tablet Take 1 tablet (25 mg total) by mouth every 6 (six) hours as needed for nausea.  40 tablet  1  . sildenafil (VIAGRA) 100 MG tablet Take 0.5-1 tablets (50-100 mg total) by mouth daily as needed for erectile dysfunction.  3 tablet  0  . Testosterone 40.5 MG/2.5GM (1.62%) GEL Place 1 application onto the skin daily.  2.5 g  5  . vardenafil (LEVITRA) 20 MG tablet  Take 1 tablet (20 mg total) by mouth as needed for erectile dysfunction.  5 tablet  9  . vitamin E 600 UNIT capsule Take 600 Units by mouth daily.         No current facility-administered medications for this visit.    Allergies  Allergen Reactions  . Cefuroxime Axetil     Family History  Problem Relation Age of Onset  . Lymphoma Mother   . Heart disease Father     CHF  . Ovarian cancer Sister   . Lung cancer Sister   . Coronary artery disease Sister     CABG  . Coronary artery disease Brother     stent, and CAD    History   Social History  . Marital Status: Married    Spouse Name: N/A    Number of Children: 2  . Years of Education: N/A   Occupational History  . ship and receive manager    Social History Main  Topics  . Smoking status: Former Games developer  . Smokeless tobacco: Never Used  . Alcohol Use: Yes  . Drug Use: No  . Sexually Active: Not on file   Other Topics Concern  . Not on file   Social History Narrative  . No narrative on file     Constitutional: Pt reports headache. Denies fever, malaise, fatigue,or abrupt weight changes.  HEENT: Denies eye pain, eye redness, ear pain, ringing in the ears, wax buildup, runny nose, nasal congestion, bloody nose, or sore throat. Respiratory: Denies difficulty breathing, shortness of breath, cough or sputum production.   Cardiovascular: Denies chest pain, chest tightness, palpitations or swelling in the hands or feet.  Neurological: Denies dizziness, difficulty with memory, difficulty with speech or problems with balance and coordination.   No other specific complaints in a complete review of systems (except as listed in HPI above).  Objective:   Physical Exam   BP 136/84  Pulse 94  Temp(Src) 97.9 F (36.6 C) (Oral)  Ht 5\' 10"  (1.778 m)  Wt 235 lb 9.6 oz (106.867 kg)  BMI 33.8 kg/m2  SpO2 95% Wt Readings from Last 3 Encounters:  08/01/12 235 lb 9.6 oz (106.867 kg)  05/26/12 233 lb 4 oz (105.802 kg)  01/05/12 233 lb (105.688 kg)    General: Appears his stated age, well developed, well nourished in NAD. Cardiovascular: Normal rate and rhythm. S1,S2 noted.  No murmur, rubs or gallops noted. No JVD or BLE edema. No carotid bruits noted. Pulmonary/Chest: Normal effort and positive vesicular breath sounds. No respiratory distress. No wheezes, rales or ronchi noted.  Neurological: Alert and oriented. Cranial nerves II-XII intact. Coordination normal. +DTRs bilaterally.       Assessment & Plan:   Headache, with additional workup required:  80 mg Depo IM today Take 800 mg OTC advil  RTC as needed or if symptoms persist

## 2012-08-01 NOTE — Addendum Note (Signed)
Addended by: Carin Primrose on: 08/01/2012 04:10 PM   Modules accepted: Orders

## 2012-08-01 NOTE — Patient Instructions (Addendum)
DASH Diet The DASH diet stands for "Dietary Approaches to Stop Hypertension." It is a healthy eating plan that has been shown to reduce high blood pressure (hypertension) in as little as 14 days, while also possibly providing other significant health benefits. These other health benefits include reducing the risk of breast cancer after menopause and reducing the risk of type 2 diabetes, heart disease, colon cancer, and stroke. Health benefits also include weight loss and slowing kidney failure in patients with chronic kidney disease.  DIET GUIDELINES  Limit salt (sodium). Your diet should contain less than 1500 mg of sodium daily.  Limit refined or processed carbohydrates. Your diet should include mostly whole grains. Desserts and added sugars should be used sparingly.  Include small amounts of heart-healthy fats. These types of fats include nuts, oils, and tub margarine. Limit saturated and trans fats. These fats have been shown to be harmful in the body. CHOOSING FOODS  The following food groups are based on a 2000 calorie diet. See your Registered Dietitian for individual calorie needs. Grains and Grain Products (6 to 8 servings daily)  Eat More Often: Whole-wheat bread, brown rice, whole-grain or wheat pasta, quinoa, popcorn without added fat or salt (air popped).  Eat Less Often: White bread, white pasta, white rice, cornbread. Vegetables (4 to 5 servings daily)  Eat More Often: Fresh, frozen, and canned vegetables. Vegetables may be raw, steamed, roasted, or grilled with a minimal amount of fat.  Eat Less Often/Avoid: Creamed or fried vegetables. Vegetables in a cheese sauce. Fruit (4 to 5 servings daily)  Eat More Often: All fresh, canned (in natural juice), or frozen fruits. Dried fruits without added sugar. One hundred percent fruit juice ( cup [237 mL] daily).  Eat Less Often: Dried fruits with added sugar. Canned fruit in light or heavy syrup. [employer], Fish, and Poultry (2  servings or less daily. One serving is 3 to 4 oz [85-114 g]).  Eat More Often: Ninety percent or leaner ground beef, tenderloin, sirloin. Round cuts of beef, chicken breast, [place] breast. All fish. Grill, bake, or broil your meat. Nothing should be fried.  Eat Less Often/Avoid: Fatty cuts of meat, [place], or chicken leg, thigh, or wing. Fried cuts of meat or fish. Dairy (2 to 3 servings)  Eat More Often: Low-fat or fat-free milk, low-fat plain or light yogurt, reduced-fat or part-skim cheese.  Eat Less Often/Avoid: Milk (whole, 2%).Whole milk yogurt. Full-fat cheeses. Nuts, Seeds, and Legumes (4 to 5 servings per week)  Eat More Often: All without added salt.  Eat Less Often/Avoid: Salted nuts and seeds, canned beans with added salt. Fats and Sweets (limited)  Eat More Often: Vegetable oils, tub margarines without trans fats, sugar-free gelatin. Mayonnaise and salad dressings.  Eat Less Often/Avoid: Coconut oils, palm oils, butter, stick margarine, cream, half and half, cookies, candy, pie. FOR MORE INFORMATION The Dash Diet Eating Plan: www.dashdiet.org Document Released: 05/28/2011 Document Revised: 08/31/2011 Document Reviewed: 05/28/2011 [facility] Patient Information 2013 [place], [place]. General Headache Without Cause A headache is pain or discomfort felt around the head or neck area. The specific cause of a headache may not be found. There are many causes and types of headaches. A few common ones are:  Tension headaches.  Migraine headaches.  Cluster headaches.  Chronic daily headaches. HOME CARE INSTRUCTIONS   Keep all follow-up appointments with your caregiver or any specialist referral.  Only take over-the-counter or prescription medicines for pain or discomfort as directed by your caregiver.  Lie down  in a dark, quiet room when you have a headache.  Keep a headache journal to find out what may trigger your migraine headaches. For example, write down:  What you  eat and drink.  How much sleep you get.  Any change to your diet or medicines.  Try massage or other relaxation techniques.  Put ice packs or heat on the head and neck. Use these 3 to 4 times per day for 15 to 20 minutes each time, or as needed.  Limit stress.  Sit up straight, and do not tense your muscles.  Quit smoking if you smoke.  Limit alcohol use.  Decrease the amount of caffeine you drink, or stop drinking caffeine.  Eat and sleep on a regular schedule.  Get 7 to 9 hours of sleep, or as recommended by your caregiver.  Keep lights dim if bright lights bother you and make your headaches worse. SEEK MEDICAL CARE IF:   You have problems with the medicines you were prescribed.  Your medicines are not working.  You have a change from the usual headache.  You have nausea or vomiting. SEEK IMMEDIATE MEDICAL CARE IF:   Your headache becomes severe.  You have a fever.  You have a stiff neck.  You have loss of vision.  You have muscular weakness or loss of muscle control.  You start losing your balance or have trouble walking.  You feel faint or pass out.  You have severe symptoms that are different from your first symptoms. MAKE SURE YOU:   Understand these instructions.  Will watch your condition.  Will get help right away if you are not doing well or get worse. Document Released: 06/08/2005 Document Revised: 08/31/2011 Document Reviewed: 06/24/2011 [facility] Patient Information 2013 [place], [place].

## 2012-09-09 ENCOUNTER — Other Ambulatory Visit: Payer: Self-pay | Admitting: Internal Medicine

## 2012-10-11 ENCOUNTER — Other Ambulatory Visit (INDEPENDENT_AMBULATORY_CARE_PROVIDER_SITE_OTHER): Payer: BC Managed Care – PPO

## 2012-10-11 DIAGNOSIS — R7309 Other abnormal glucose: Secondary | ICD-10-CM

## 2012-10-11 DIAGNOSIS — Z0000 Encounter for general adult medical examination without abnormal findings: Secondary | ICD-10-CM

## 2012-10-11 DIAGNOSIS — R7302 Impaired glucose tolerance (oral): Secondary | ICD-10-CM

## 2012-10-11 LAB — PSA: PSA: 0.2 ng/mL (ref 0.10–4.00)

## 2012-10-11 LAB — CBC WITH DIFFERENTIAL/PLATELET
Basophils Relative: 0.2 % (ref 0.0–3.0)
Eosinophils Relative: 1 % (ref 0.0–5.0)
Hemoglobin: 14.5 g/dL (ref 13.0–17.0)
Lymphocytes Relative: 22.3 % (ref 12.0–46.0)
MCV: 90.4 fl (ref 78.0–100.0)
Neutro Abs: 4.1 10*3/uL (ref 1.4–7.7)
Neutrophils Relative %: 69.1 % (ref 43.0–77.0)
RBC: 4.85 Mil/uL (ref 4.22–5.81)
WBC: 5.9 10*3/uL (ref 4.5–10.5)

## 2012-10-11 LAB — HEPATIC FUNCTION PANEL
ALT: 70 U/L — ABNORMAL HIGH (ref 0–53)
Alkaline Phosphatase: 80 U/L (ref 39–117)
Bilirubin, Direct: 0.1 mg/dL (ref 0.0–0.3)
Total Protein: 7 g/dL (ref 6.0–8.3)

## 2012-10-11 LAB — BASIC METABOLIC PANEL
CO2: 28 mEq/L (ref 19–32)
Calcium: 9.4 mg/dL (ref 8.4–10.5)
Chloride: 103 mEq/L (ref 96–112)
Potassium: 5.2 mEq/L — ABNORMAL HIGH (ref 3.5–5.1)
Sodium: 139 mEq/L (ref 135–145)

## 2012-10-11 LAB — URINALYSIS, ROUTINE W REFLEX MICROSCOPIC
Ketones, ur: NEGATIVE
Leukocytes, UA: NEGATIVE
Nitrite: NEGATIVE
Specific Gravity, Urine: 1.01 (ref 1.000–1.030)
Total Protein, Urine: NEGATIVE
pH: 6 (ref 5.0–8.0)

## 2012-10-11 LAB — LIPID PANEL: Total CHOL/HDL Ratio: 3

## 2012-10-12 ENCOUNTER — Encounter: Payer: Self-pay | Admitting: Internal Medicine

## 2012-10-12 ENCOUNTER — Other Ambulatory Visit: Payer: BC Managed Care – PPO

## 2012-10-12 ENCOUNTER — Ambulatory Visit (INDEPENDENT_AMBULATORY_CARE_PROVIDER_SITE_OTHER): Payer: BC Managed Care – PPO | Admitting: Internal Medicine

## 2012-10-12 VITALS — BP 120/78 | HR 90 | Temp 97.6°F | Ht 70.0 in | Wt 238.1 lb

## 2012-10-12 DIAGNOSIS — E291 Testicular hypofunction: Secondary | ICD-10-CM

## 2012-10-12 DIAGNOSIS — K921 Melena: Secondary | ICD-10-CM

## 2012-10-12 DIAGNOSIS — R7302 Impaired glucose tolerance (oral): Secondary | ICD-10-CM

## 2012-10-12 DIAGNOSIS — Z0000 Encounter for general adult medical examination without abnormal findings: Secondary | ICD-10-CM

## 2012-10-12 DIAGNOSIS — J309 Allergic rhinitis, unspecified: Secondary | ICD-10-CM

## 2012-10-12 DIAGNOSIS — R7309 Other abnormal glucose: Secondary | ICD-10-CM

## 2012-10-12 DIAGNOSIS — Z23 Encounter for immunization: Secondary | ICD-10-CM

## 2012-10-12 MED ORDER — FEXOFENADINE HCL 180 MG PO TABS: 180.0000 mg | ORAL_TABLET | Freq: Every day | ORAL | Status: DC

## 2012-10-12 MED ORDER — FLUTICASONE PROPIONATE 50 MCG/ACT NA SUSP: 2.0000 | Freq: Every day | NASAL | Status: DC

## 2012-10-12 MED ORDER — LISINOPRIL 20 MG PO TABS: 20.0000 mg | ORAL_TABLET | Freq: Every day | ORAL | Status: DC

## 2012-10-12 MED ORDER — METHYLPREDNISOLONE ACETATE 80 MG/ML IJ SUSP
80.0000 mg | Freq: Once | INTRAMUSCULAR | Status: AC
  Administered 2012-10-12: 80 mg via INTRAMUSCULAR

## 2012-10-12 NOTE — Assessment & Plan Note (Signed)
Overall doing well, age appropriate education and counseling updated, referrals for preventative services and immunizations addressed, dietary and smoking counseling addressed, most recent labs reviewed.  I have personally reviewed and have noted: 1) the patient's medical and social history 2) The pt's use of alcohol, tobacco, and illicit drugs 3) The patient's current medications and supplements 4) Functional ability including ADL's, fall risk, home safety risk, hearing and visual impairment 5) Diet and physical activities 6) Evidence for depression or mood disorder 7) The patient's height, weight, and BMI have been recorded in the chart I have made referrals, and provided counseling and education based on review of the above ECG reviewed as per emr For shingles shot today

## 2012-10-12 NOTE — Assessment & Plan Note (Signed)
Minor self limited last wk, none since, due for colonoscopy oct 2014, but will try to refer now

## 2012-10-12 NOTE — Assessment & Plan Note (Signed)
With rather significant seasonal flare - for depomedrol IM, allegra and flonase asd,  to f/u any worsening symptoms or concerns

## 2012-10-12 NOTE — Patient Instructions (Addendum)
Your EKG was OK today, and you are given the lab results in hardcopy You had the steroid shot today Please take all new medication as prescribed - the allegra , and flonase (sent to the pharmacy) You will be contacted regarding the referral for: colonoscopy You had the shingles shot today Please continue all other medications as before, and refills have been done if requested. Please continue your efforts at being more active, low cholesterol diet, and weight control. You are otherwise up to date with prevention measures today. Please go to the LAB in the Basement (turn left off the elevator) for the tests to be done toda - just the testosterone level (will take 2-3 days to get results) You will be contacted by phone if any changes need to be made immediately.  Otherwise, you will receive a letter about your results with an explanation, but please check with MyChart first. Thank you for enrolling in MyChart. Please follow the instructions below to securely access your online medical record. MyChart allows you to send messages to your doctor, view your test results, renew your prescriptions, schedule appointments, and more. To Log into My Chart online, please go by [employer] or [employer] to [email or web address], or download the MyChart App from the [employer] .  Your Username is: [email or web address] (pass [phone]) Please return in 1 year for your yearly visit, or sooner if needed, with Lab testing done 3-5 days before

## 2012-10-12 NOTE — Progress Notes (Signed)
Subjective:    Patient ID: Warren Weeks, male    DOB: 12/23/1945, 67 y.o.   MRN: [id number]  HPI Here for wellness and f/u;  Overall doing ok;  Pt denies CP, worsening SOB, DOE, wheezing, orthopnea, PND, worsening LE edema, palpitations, dizziness or syncope.  Pt denies neurological change such as new headache, facial or extremity weakness.  Pt denies polydipsia, polyuria, or low sugar symptoms. Pt states overall good compliance with treatment and medications, good tolerability, and has been trying to follow lower cholesterol diet.  Pt denies worsening depressive symptoms, suicidal ideation or panic. No fever, night sweats, wt loss, loss of appetite, or other constitutional symptoms.  Pt states good ability with ADL's, has low fall risk, home safety reviewed and adequate, no other significant changes in hearing or vision, and only occasionally active with exercise, but plans to join silver sneakers soon. Gained 5 lbs since last visit.  Does have several wks ongoing nasal allergy symptoms with clearish congestion, itch and sneezing, without fever, pain, ST, cough, swelling or wheezing. Due for testosterone f/u, overall good med compliacne and tolerance.  Did have an episode of small amount BRBPR x 1, painless without orthostasis on tissue and in stool, but none since.  Due for colonoscopy f/u oct 2014.  Past Medical History  Diagnosis Date  . GLUCOSE INTOLERANCE 09/16/2007  . HYPERCHOLESTEROLEMIA 03/24/2007  . HYPERLIPIDEMIA 09/16/2007  . OBESITY 03/24/2007  . ANXIETY 03/28/2007  . ERECTILE DYSFUNCTION 03/28/2007  . DEPRESSION 03/28/2007  . OTITIS MEDIA, LEFT 06/10/2009  . HYPERTENSION 03/28/2007  . CAROTID ARTERY STENOSIS, BILATERAL 07/01/2009  . PVD 06/25/2009  . HEMORRHOIDS, INTERNAL 03/24/2007  . URI 08/16/2009  . ALLERGIC RHINITIS 09/16/2007  . ESOPHAGITIS 03/24/2007  . ESOPHAGEAL STRICTURE 09/16/2007  . GERD 03/24/2007  . LOW BACK PAIN 03/28/2007  . Dizziness and giddiness 06/08/2009  .  RASH-NONVESICULAR 06/17/2009  . CEPHALGIA 06/08/2009  . DYSPNEA/SHORTNESS OF BREATH 09/16/2007  . CHEST PAIN 09/16/2007  . MAGNETIC RESONANCE IMAGING, BRAIN, ABNORMAL 06/26/2009  . ALLERGY 03/24/2007  . COLONIC POLYPS, HX OF 09/16/2007  . NECK PAIN, LEFT 06/20/2010  . Palpitations 06/20/2010  . Impaired glucose tolerance 10/04/2010  . 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 Tonsillectomy    . S/p right knee arthroscopy      reports that he has quit smoking. He has never used smokeless tobacco. He reports that  drinks alcohol. He reports that he does not use illicit drugs. family history includes Coronary artery disease in his brother and sister; Heart disease in his father; Lung cancer in his sister; Lymphoma in his mother; and Ovarian cancer in his sister. Allergies  Allergen Reactions  . Cefuroxime Axetil    Current Outpatient Prescriptions on File Prior to Visit  Medication Sig Dispense Refill  . ANDROGEL PUMP 20.25 MG/ACT (1.62%) GEL APPLY ONCE TOPICALLY DAILY  75 g  4  . aspirin (ECOTRIN LOW STRENGTH) 81 MG EC tablet Take 81 mg by mouth daily.        . CRESTOR 40 MG tablet TAKE ONE TABLET BY MOUTH EVERY DAY  90 tablet  2  . cyclobenzaprine (FLEXERIL) 5 MG tablet Take 1 tablet (5 mg total) by mouth 3 (three) times daily as needed.  90 tablet  3  . diclofenac (VOLTAREN) 75 MG EC tablet       . fish oil-omega-3 fatty acids 1000 MG capsule Take 1,200 mg by mouth daily.      Marland Kitchen  omeprazole (PRILOSEC) 20 MG capsule Take 2 capsules (40 mg total) by mouth daily.  180 capsule  3  . VIAGRA 100 MG tablet TAKE ONE-HALF TO ONE TABLET BY MOUTH EVERY DAY AS NEEDED FOR  ERECTILE  DYSFUNCTION  3 tablet  0  . vitamin E 600 UNIT capsule Take 600 Units by mouth daily.        Marland Kitchen ezetimibe (ZETIA) 10 MG tablet Take 1 tablet (10 mg total) by mouth daily.  90 tablet  3   No current facility-administered medications on file prior to visit.   Review of  Systems Constitutional: Negative for diaphoresis, activity change, appetite change or unexpected weight change.  HENT: Negative for hearing loss, ear pain, facial swelling, mouth sores and neck stiffness.   Eyes: Negative for pain, redness and visual disturbance.  Respiratory: Negative for shortness of breath and wheezing.   Cardiovascular: Negative for chest pain and palpitations.  Gastrointestinal: Negative for diarrhea, blood in stool, abdominal distention or other pain Genitourinary: Negative for hematuria, flank pain or change in urine volume.  Musculoskeletal: Negative for myalgias and joint swelling.  Skin: Negative for color change and wound.  Neurological: Negative for syncope and numbness. other than noted Hematological: Negative for adenopathy.  Psychiatric/Behavioral: Negative for hallucinations, self-injury, decreased concentration and agitation.      Objective:   Physical Exam BP 120/78  Pulse 90  Temp(Src) 97.6 F (36.4 C) (Oral)  Ht 5\' 10"  (1.778 m)  Wt 238 lb 2 oz (108.013 kg)  BMI 34.17 kg/m2  SpO2 93% VS noted,  Constitutional: Pt is oriented to person, place, and time. Appears well-developed and well-nourished.  Head: Normocephalic and atraumatic.  Right Ear: External ear normal.  Left Ear: External ear normal.  Nose: Nose normal.  Mouth/Throat: Oropharynx is clear and moist.  Bilat tm's with mild erythema.  Max sinus areas non tender.  Pharynx with mild erythema, no exudate Eyes: Conjunctivae and EOM are normal. Pupils are equal, round, and reactive to light.  Neck: Normal range of motion. Neck supple. No JVD present. No tracheal deviation present.  Cardiovascular: Normal rate, regular rhythm, normal heart sounds and intact distal pulses.   Pulmonary/Chest: Effort normal and breath sounds normal.  Abdominal: Soft. Bowel sounds are normal. There is no tenderness. No HSM  Musculoskeletal: Normal range of motion. Exhibits no edema.  Lymphadenopathy:  Has no  cervical adenopathy.  Neurological: Pt is alert and oriented to person, place, and time. Pt has normal reflexes. No cranial nerve deficit.  Skin: Skin is warm and dry. No rash noted.  Psychiatric:  Has  normal mood and affect. Behavior is normal.     Assessment & Plan:

## 2012-10-12 NOTE — Assessment & Plan Note (Signed)
Has labs prior to visit, but still needs f/u testosterone , will ask for this

## 2012-10-12 NOTE — Addendum Note (Signed)
Addended by: Scharlene Gloss B on: 10/12/2012 10:32 AM   Modules accepted: Orders

## 2012-10-13 ENCOUNTER — Telehealth: Payer: Self-pay | Admitting: Internal Medicine

## 2012-10-13 LAB — TESTOSTERONE, FREE, TOTAL, SHBG
Sex Hormone Binding: 37 nmol/L (ref 13–71)
Testosterone, Free: 25.9 pg/mL — ABNORMAL LOW (ref 47.0–244.0)

## 2012-10-13 MED ORDER — TESTOSTERONE 20.25 MG/ACT (1.62%) TD GEL: 40.5000 "application " | Freq: Every day | TRANSDERMAL | Status: DC

## 2012-10-13 NOTE — Telephone Encounter (Signed)
Ok to change to 2 pump per day - Done hardcopy to [employer]

## 2012-10-13 NOTE — Telephone Encounter (Signed)
Message copied by Corwin Levins on Thu Oct 13, 2012  5:36 PM ------      Message from: Scharlene Gloss B      Created: Thu Oct 13, 2012  3:28 PM       Called the  Patient on his cell [phone]) stated he has been doing it everyday, but only one pump  A day. ------

## 2012-10-14 NOTE — Telephone Encounter (Signed)
Faxed hardcopy to pharmacy. 

## 2012-12-20 ENCOUNTER — Other Ambulatory Visit: Payer: Self-pay

## 2012-12-20 MED ORDER — SILDENAFIL CITRATE 100 MG PO TABS: 100.0000 mg | ORAL_TABLET | ORAL | Status: AC | PRN

## 2012-12-20 MED ORDER — CYCLOBENZAPRINE HCL 5 MG PO TABS: 5.0000 mg | ORAL_TABLET | Freq: Three times a day (TID) | ORAL | Status: DC | PRN

## 2012-12-20 NOTE — Telephone Encounter (Signed)
Done erx 

## 2012-12-30 ENCOUNTER — Other Ambulatory Visit: Payer: Self-pay | Admitting: Internal Medicine

## 2012-12-30 NOTE — Telephone Encounter (Signed)
r 

## 2013-01-04 ENCOUNTER — Encounter: Payer: Self-pay | Admitting: Internal Medicine

## 2013-01-04 ENCOUNTER — Ambulatory Visit (INDEPENDENT_AMBULATORY_CARE_PROVIDER_SITE_OTHER): Payer: BC Managed Care – PPO | Admitting: Internal Medicine

## 2013-01-04 VITALS — BP 163/89 | HR 83

## 2013-01-04 DIAGNOSIS — I10 Essential (primary) hypertension: Secondary | ICD-10-CM

## 2013-01-04 DIAGNOSIS — I6523 Occlusion and stenosis of bilateral carotid arteries: Secondary | ICD-10-CM

## 2013-01-04 DIAGNOSIS — I658 Occlusion and stenosis of other precerebral arteries: Secondary | ICD-10-CM

## 2013-01-04 DIAGNOSIS — I6529 Occlusion and stenosis of unspecified carotid artery: Secondary | ICD-10-CM

## 2013-01-04 NOTE — Patient Instructions (Addendum)
Your physician wants you to follow-up in: 24 months with DrTaylor You will receive a reminder letter in the mail two months in advance. If you don't receive a letter, please call our office to schedule the follow-up appointment.  Your physician has requested that you have a carotid duplex. This test is an ultrasound of the carotid arteries in your neck. It looks at blood flow through these arteries that supply the brain with blood. Allow one hour for this exam. There are no restrictions or special instructions.

## 2013-01-04 NOTE — Assessment & Plan Note (Signed)
His blood pressure is somewhat elevated. I have asked the patient to reduce his sodium intake.

## 2013-01-04 NOTE — Progress Notes (Signed)
HPI [name] returns today for followup. He is a pleasant 67 yo man with HTN, carotid vascular disease, and symptomatic PAC's and PVC's. In the interim, he has had very little in the way of palpitations. He denies chest pain, sob, or syncope. No edema. He is exercising regularly but has not lost any weight.  Allergies  Allergen Reactions  . Cefuroxime Axetil      Current Outpatient Prescriptions  Medication Sig Dispense Refill  . aspirin (ECOTRIN LOW STRENGTH) 81 MG EC tablet Take 81 mg by mouth daily.        . CRESTOR 40 MG tablet TAKE ONE TABLET BY MOUTH EVERY DAY  90 tablet  2  . cyclobenzaprine (FLEXERIL) 5 MG tablet Take 1 tablet (5 mg total) by mouth 3 (three) times daily as needed.  90 tablet  3  . diclofenac (VOLTAREN) 75 MG EC tablet Take 75 mg by mouth as needed.       . ezetimibe (ZETIA) 10 MG tablet Take 1 tablet (10 mg total) by mouth daily.  90 tablet  3  . fluticasone (FLONASE) 50 MCG/ACT nasal spray Place 2 sprays into the nose daily.  16 g  4  . lisinopril (PRINIVIL,ZESTRIL) 20 MG tablet Take 1 tablet (20 mg total) by mouth daily.  90 tablet  3  . omeprazole (PRILOSEC) 20 MG capsule Take 2 capsules (40 mg total) by mouth daily.  180 capsule  3  . sertraline (ZOLOFT) 50 MG tablet TAKE ONE TABLET BY MOUTH EVERY DAY  90 tablet  2  . sildenafil (VIAGRA) 100 MG tablet Take 1 tablet (100 mg total) by mouth as needed for erectile dysfunction.  10 tablet  11  . Testosterone (ANDROGEL PUMP) 20.25 MG/ACT (1.62%) GEL Place 40.5 application onto the skin daily.  75 g  4  . vitamin E 600 UNIT capsule Take 600 Units by mouth daily.         No current facility-administered medications for this visit.     Past Medical History  Diagnosis Date  . GLUCOSE INTOLERANCE 09/16/2007  . HYPERCHOLESTEROLEMIA 03/24/2007  . HYPERLIPIDEMIA 09/16/2007  . OBESITY 03/24/2007  . ANXIETY 03/28/2007  . ERECTILE DYSFUNCTION 03/28/2007  . DEPRESSION 03/28/2007  . OTITIS MEDIA, LEFT 06/10/2009  .  HYPERTENSION 03/28/2007  . CAROTID ARTERY STENOSIS, BILATERAL 07/01/2009  . PVD 06/25/2009  . HEMORRHOIDS, INTERNAL 03/24/2007  . URI 08/16/2009  . ALLERGIC RHINITIS 09/16/2007  . ESOPHAGITIS 03/24/2007  . ESOPHAGEAL STRICTURE 09/16/2007  . GERD 03/24/2007  . LOW BACK PAIN 03/28/2007  . Dizziness and giddiness 06/08/2009  . RASH-NONVESICULAR 06/17/2009  . CEPHALGIA 06/08/2009  . DYSPNEA/SHORTNESS OF BREATH 09/16/2007  . CHEST PAIN 09/16/2007  . MAGNETIC RESONANCE IMAGING, BRAIN, ABNORMAL 06/26/2009  . ALLERGY 03/24/2007  . COLONIC POLYPS, HX OF 09/16/2007  . NECK PAIN, LEFT 06/20/2010  . Palpitations 06/20/2010  . Impaired glucose tolerance 10/04/2010  . 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   Past Surgical History  Procedure Laterality Date  . Tonsillectomy    . S/p right knee arthroscopy       Family History  Problem Relation Age of Onset  . Lymphoma Mother   . Heart disease Father     CHF  . Ovarian cancer Sister   . Lung cancer Sister   . Coronary artery disease Sister     CABG  . Coronary artery disease Brother     stent,  and CAD     History   Social History  . Marital Status: Married    Spouse Name: N/A    Number of Children: 2  . Years of Education: N/A   Occupational History  . ship and receive manager    Social History Main Topics  . Smoking status: Former Games developer  . Smokeless tobacco: Never Used  . Alcohol Use: Yes  . Drug Use: No  . Sexually Active: Not on file   Other Topics Concern  . Not on file   Social History Narrative  . No narrative on file     BP 163/89  Pulse 83 BP 148/80 on my exam. Physical Exam:  Well appearing middle aged man, NAD HEENT: Unremarkable Neck:  6 cm JVD, no thyromegally Back:  No CVA tenderness Lungs:  Clear with no wheezes, rales, or rhonchi HEART:  Regular rate rhythm, no murmurs, no rubs, no clicks Abd:  soft, positive bowel sounds, no  organomegally, no rebound, no guarding Ext:  2 plus pulses, no edema, no cyanosis, no clubbing Skin:  No rashes no nodules Neuro:  CN II through XII intact, motor grossly intact   Assess/Plan:

## 2013-01-04 NOTE — Addendum Note (Signed)
Addended by: Dennis Bast F on: 01/04/2013 11:03 AM   Modules accepted: Orders

## 2013-01-04 NOTE — Assessment & Plan Note (Signed)
He is 2 years out from an ultrasound. We'll schedule carotid ultrasound in the next few weeks to assess the degree of carotid artery narrowing.

## 2013-01-09 ENCOUNTER — Encounter (INDEPENDENT_AMBULATORY_CARE_PROVIDER_SITE_OTHER): Payer: BC Managed Care – PPO

## 2013-01-09 DIAGNOSIS — I10 Essential (primary) hypertension: Secondary | ICD-10-CM

## 2013-01-09 DIAGNOSIS — I6529 Occlusion and stenosis of unspecified carotid artery: Secondary | ICD-10-CM

## 2013-01-09 DIAGNOSIS — I6523 Occlusion and stenosis of bilateral carotid arteries: Secondary | ICD-10-CM

## 2013-01-17 ENCOUNTER — Telehealth: Payer: Self-pay | Admitting: Cardiovascular Disease

## 2013-01-17 NOTE — Telephone Encounter (Signed)
Dr Ladona Ridgel reviewed and will not make any changes based on his carotid ultrasound.  Patient aware

## 2013-01-17 NOTE — Telephone Encounter (Signed)
New prob  Pt is calling regarding the ultrasound that he had last week.

## 2013-01-18 ENCOUNTER — Ambulatory Visit (INDEPENDENT_AMBULATORY_CARE_PROVIDER_SITE_OTHER): Payer: BC Managed Care – PPO | Admitting: Internal Medicine

## 2013-01-18 ENCOUNTER — Other Ambulatory Visit: Payer: Self-pay | Admitting: Internal Medicine

## 2013-01-18 ENCOUNTER — Other Ambulatory Visit (INDEPENDENT_AMBULATORY_CARE_PROVIDER_SITE_OTHER): Payer: BC Managed Care – PPO

## 2013-01-18 ENCOUNTER — Encounter: Payer: Self-pay | Admitting: Internal Medicine

## 2013-01-18 ENCOUNTER — Ambulatory Visit (INDEPENDENT_AMBULATORY_CARE_PROVIDER_SITE_OTHER)
Admission: RE | Admit: 2013-01-18 | Discharge: 2013-01-18 | Disposition: A | Discharge status: Home / Self Care | Payer: BC Managed Care – PPO | Source: Ambulatory Visit | Attending: Internal Medicine | Admitting: Internal Medicine

## 2013-01-18 VITALS — BP 160/90 | HR 66 | Temp 98.1°F | Ht 69.5 in | Wt 237.5 lb

## 2013-01-18 DIAGNOSIS — R079 Chest pain, unspecified: Secondary | ICD-10-CM

## 2013-01-18 DIAGNOSIS — K219 Gastro-esophageal reflux disease without esophagitis: Secondary | ICD-10-CM

## 2013-01-18 DIAGNOSIS — I10 Essential (primary) hypertension: Secondary | ICD-10-CM

## 2013-01-18 DIAGNOSIS — R002 Palpitations: Secondary | ICD-10-CM

## 2013-01-18 DIAGNOSIS — E875 Hyperkalemia: Secondary | ICD-10-CM

## 2013-01-18 LAB — BASIC METABOLIC PANEL
BUN: 9 mg/dL (ref 6–23)
Creatinine, Ser: 1.1 mg/dL (ref 0.4–1.5)
GFR: 90.27 mL/min (ref >60.00)
Potassium: 5.8 mEq/L — ABNORMAL HIGH (ref 3.5–5.1)

## 2013-01-18 LAB — HEPATIC FUNCTION PANEL
Bilirubin, Direct: 0.1 mg/dL (ref 0.0–0.3)
Total Bilirubin: 0.7 mg/dL (ref 0.3–1.2)

## 2013-01-18 LAB — CBC WITH DIFFERENTIAL/PLATELET
Basophils Relative: 0.3 % (ref 0.0–3.0)
Eosinophils Relative: 1.4 % (ref 0.0–5.0)
HCT: 48.6 % (ref 39.0–52.0)
Lymphs Abs: 1.2 10*3/uL (ref 0.7–4.0)
MCV: 92.4 fl (ref 78.0–100.0)
Monocytes Absolute: 0.5 10*3/uL (ref 0.1–1.0)
Neutro Abs: 4.8 10*3/uL (ref 1.4–7.7)
RBC: 5.26 Mil/uL (ref 4.22–5.81)
WBC: 6.6 10*3/uL (ref 4.5–10.5)

## 2013-01-18 MED ORDER — LISINOPRIL 10 MG PO TABS: 10.0000 mg | ORAL_TABLET | Freq: Every day | ORAL | Status: DC

## 2013-01-18 MED ORDER — METOPROLOL TARTRATE 25 MG PO TABS: 25.0000 mg | ORAL_TABLET | Freq: Two times a day (BID) | ORAL | Status: DC

## 2013-01-18 NOTE — Patient Instructions (Addendum)
Your EKG was OK today Please take all new medication as prescribed - the metoprolol at 25 mg twice per day (this is a lower dose) Please continue all other medications as before, and refills have been done if requested. Please go to the LAB in the Basement (turn left off the elevator) for the tests to be done today Please go to the XRAY Department in the Basement (go straight as you get off the elevator) for the x-ray testing You will be contacted by phone if any changes need to be made immediately.  Otherwise, you will receive a letter about your results with an explanation, but please check with MyChart first.  You will be contacted regarding the referral for: Heart monitor, Dr Taylor/card, and stress testing, and echocardiogram  Please return about April 2015, with labs done prior to the visit, or sooner if needed

## 2013-01-18 NOTE — Assessment & Plan Note (Addendum)
ECG reviewed as per emr, etiology o/w unclear, ? SVT , for cardiac monitor, f/u Dr Taylor/card  Note:  Total time for pt hx, exam, review of record with pt in the room, determination of diagnoses and plan for further eval and tx is > 40 min, with over 50% spent in coordination and counseling of patient

## 2013-01-22 NOTE — Assessment & Plan Note (Signed)
Atypical, for stress testing

## 2013-01-22 NOTE — Assessment & Plan Note (Signed)
In light of above, to add metoprolol 25 bid

## 2013-01-22 NOTE — Assessment & Plan Note (Signed)
To cont PPI take daily

## 2013-01-22 NOTE — Progress Notes (Signed)
Subjective:    Patient ID: Warren Weeks, male    DOB: [birth year], 67 y.o.   MRN: [id number]  HPI  Here with c/o dizziness and palpitations;  On his daily walk 2 days ago experienced acute dizziness, felt off balance, leaning sensation assoc with regular fast heart beats for several minutes.Stood still, seemed to pass, then recurred briefly again.  Was able to walk home, BP there 190/80, took an extra 20 mg ACE  Pt denies increased sob or doe, wheezing, orthopnea, PND, increased LE swelling, palpitations or syncope, but has had had diffuse dull chest discomfort as well wtihout radiation or syptoms above.  Echo 2011 with LAA, EF 60%, last stress test approx 8 yrs.  Last cardiac monitor over 3 yrs.  Has also had worsening reflux mild, but no abd pain, dysphagia, n/v, bowel change or blood. Had not taken recent decongestants or caffeine, or other OTC.   Past Medical History  Diagnosis Date  . GLUCOSE INTOLERANCE 09/16/2007  . HYPERCHOLESTEROLEMIA 03/24/2007  . HYPERLIPIDEMIA 09/16/2007  . OBESITY 03/24/2007  . ANXIETY 03/28/2007  . ERECTILE DYSFUNCTION 03/28/2007  . DEPRESSION 03/28/2007  . OTITIS MEDIA, LEFT 06/10/2009  . HYPERTENSION 03/28/2007  . CAROTID ARTERY STENOSIS, BILATERAL 07/01/2009  . PVD 06/25/2009  . HEMORRHOIDS, INTERNAL 03/24/2007  . URI 08/16/2009  . ALLERGIC RHINITIS 09/16/2007  . ESOPHAGITIS 03/24/2007  . ESOPHAGEAL STRICTURE 09/16/2007  . GERD 03/24/2007  . LOW BACK PAIN 03/28/2007  . Dizziness and giddiness 06/08/2009  . RASH-NONVESICULAR 06/17/2009  . CEPHALGIA 06/08/2009  . DYSPNEA/SHORTNESS OF BREATH 09/16/2007  . CHEST PAIN 09/16/2007  . MAGNETIC RESONANCE IMAGING, BRAIN, ABNORMAL 06/26/2009  . ALLERGY 03/24/2007  . COLONIC POLYPS, HX OF 09/16/2007  . NECK PAIN, LEFT 06/20/2010  . Palpitations 06/20/2010  . Impaired glucose tolerance 10/04/2010  . 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 Tonsillectomy    . S/p right knee arthroscopy       reports that he has quit smoking. He has never used smokeless tobacco. He reports that  drinks alcohol. He reports that he does not use illicit drugs. family history includes Coronary artery disease in his brother and sister; Heart disease in his father; Lung cancer in his sister; Lymphoma in his mother; and Ovarian cancer in his sister. Allergies  Allergen Reactions  . Cefuroxime Axetil    Current Outpatient Prescriptions on File Prior to Visit  Medication Sig Dispense Refill  . aspirin (ECOTRIN LOW STRENGTH) 81 MG EC tablet Take 81 mg by mouth daily.        . CRESTOR 40 MG tablet TAKE ONE TABLET BY MOUTH EVERY DAY  90 tablet  2  . cyclobenzaprine (FLEXERIL) 5 MG tablet Take 1 tablet (5 mg total) by mouth 3 (three) times daily as needed.  90 tablet  3  . diclofenac (VOLTAREN) 75 MG EC tablet Take 75 mg by mouth as needed.       . fluticasone (FLONASE) 50 MCG/ACT nasal spray Place 2 sprays into the nose daily.  16 g  4  . omeprazole (PRILOSEC) 20 MG capsule Take 2 capsules (40 mg total) by mouth daily.  180 capsule  3  . sertraline (ZOLOFT) 50 MG tablet TAKE ONE TABLET BY MOUTH EVERY DAY  90 tablet  2  . sildenafil (VIAGRA) 100 MG tablet Take 1 tablet (100 mg total) by mouth as needed for erectile dysfunction.  10 tablet  11  . Testosterone (ANDROGEL PUMP) 20.25 MG/ACT (1.62%)  GEL Place 40.5 application onto the skin daily.  75 g  4  . vitamin E 600 UNIT capsule Take 600 Units by mouth daily.        Marland Kitchen ezetimibe (ZETIA) 10 MG tablet Take 1 tablet (10 mg total) by mouth daily.  90 tablet  3   No current facility-administered medications on file prior to visit.    Review of Systems  Constitutional: Negative for unexpected weight change, or unusual diaphoresis  HENT: Negative for tinnitus.   Eyes: Negative for photophobia and visual disturbance.  Respiratory: Negative for choking and stridor.   Gastrointestinal: Negative for vomiting and blood in stool.  Genitourinary: Negative for  hematuria and decreased urine volume.  Musculoskeletal: Negative for acute joint swelling Skin: Negative for color change and wound.  Neurological: Negative for tremors and numbness other than noted  Psychiatric/Behavioral: Negative for decreased concentration or  hyperactivity.        Objective:   Physical Exam BP 160/90  Pulse 66  Temp(Src) 98.1 F (36.7 C) (Oral)  Ht 5' 9.5" (1.765 m)  Wt 237 lb 8 oz (107.729 kg)  BMI 34.58 kg/m2  SpO2 97% VS noted, not ill appaering Constitutional: Pt appears well-developed and well-nourished.  HENT: Head: NCAT.  Right Ear: External ear normal.  Left Ear: External ear normal.  Eyes: Conjunctivae and EOM are normal. Pupils are equal, round, and reactive to light.  Neck: Normal range of motion. Neck supple.  Cardiovascular: Normal rate and regular rhythm.   Pulmonary/Chest: Effort normal and breath sounds normal.  Abd:  Soft, NT, non-distended, + BS Neurological: Pt is alert. Not confused , motor 5/5 Skin: Skin is warm. No erythema. No LE edema Psychiatric: Pt behavior is normal. Thought content normal.     Assessment & Plan:

## 2013-01-23 ENCOUNTER — Other Ambulatory Visit: Payer: Self-pay | Admitting: Internal Medicine

## 2013-01-26 ENCOUNTER — Other Ambulatory Visit (INDEPENDENT_AMBULATORY_CARE_PROVIDER_SITE_OTHER): Payer: BC Managed Care – PPO

## 2013-01-26 DIAGNOSIS — E875 Hyperkalemia: Secondary | ICD-10-CM

## 2013-01-26 LAB — BASIC METABOLIC PANEL
BUN: 15 mg/dL (ref 6–23)
CO2: 26 mEq/L (ref 19–32)
Calcium: 9.2 mg/dL (ref 8.4–10.5)
Creatinine, Ser: 1 mg/dL (ref 0.4–1.5)
Glucose, Bld: 103 mg/dL — ABNORMAL HIGH (ref 70–99)

## 2013-02-02 ENCOUNTER — Telehealth: Payer: Self-pay | Admitting: *Deleted

## 2013-02-02 NOTE — Telephone Encounter (Signed)
Pt should take the lisinopril 10 mg and metoprolol at 25 mg twice per day as last prescribed  Please let me know if this is not what he is asking about b/c the message was not specific for which med

## 2013-02-02 NOTE — Telephone Encounter (Signed)
Pt called requesting his lab results.  Advised as per result note.  Pt requests whether he is to continue the 1/2 dose of HTN medication or is he to resume the full dose.  Please advise

## 2013-02-02 NOTE — Telephone Encounter (Signed)
Spoke with pt advised of MD message.

## 2013-02-08 ENCOUNTER — Ambulatory Visit (INDEPENDENT_AMBULATORY_CARE_PROVIDER_SITE_OTHER): Payer: BC Managed Care – PPO | Admitting: Nurse Practitioner

## 2013-02-08 ENCOUNTER — Encounter (INDEPENDENT_AMBULATORY_CARE_PROVIDER_SITE_OTHER): Payer: BC Managed Care – PPO

## 2013-02-08 ENCOUNTER — Encounter: Payer: Self-pay | Admitting: Nurse Practitioner

## 2013-02-08 VITALS — BP 160/100 | HR 72 | Ht 69.5 in | Wt 241.1 lb

## 2013-02-08 DIAGNOSIS — I472 Ventricular tachycardia: Secondary | ICD-10-CM

## 2013-02-08 DIAGNOSIS — I10 Essential (primary) hypertension: Secondary | ICD-10-CM

## 2013-02-08 DIAGNOSIS — R42 Dizziness and giddiness: Secondary | ICD-10-CM

## 2013-02-08 DIAGNOSIS — K219 Gastro-esophageal reflux disease without esophagitis: Secondary | ICD-10-CM

## 2013-02-08 DIAGNOSIS — R079 Chest pain, unspecified: Secondary | ICD-10-CM

## 2013-02-08 DIAGNOSIS — R002 Palpitations: Secondary | ICD-10-CM

## 2013-02-08 MED ORDER — HYDROCHLOROTHIAZIDE 25 MG PO TABS: 25.0000 mg | ORAL_TABLET | Freq: Every day | ORAL | Status: DC

## 2013-02-08 NOTE — Patient Instructions (Addendum)
Schedule the event monitor, echo and stress test  Try to limit your use of Voltaren  I am adding HCTZ 25 mg daily to your medicines  Follow up with Dr. Ladona Ridgel in 5 weeks   Monitor your blood pressure at home - bring your cuff with you when you come back for [place] to check  Limit your salt  Call the [facility] office at [phone] if you have any questions, problems or concerns.

## 2013-02-08 NOTE — Progress Notes (Signed)
Warren Weeks Date of Birth: 1945-12-09 Medical Record #[id number]  History of Present Illness: Warren Weeks is seen back today for a work in visit. Seen for Dr. Ladona Ridgel. Has HLD, HTN, GERD, carotid disease/PVD and symptomatic PAC's and PVCs. No known CAD reported.   Was just here a month ago - seemed to be doing well. No real palpitations reported at that visit. BP was up and he was advised to cut back his salt intake.   Visit today at the request of his PCP - On his daily walk a couple of weeks ago and experienced acute dizziness, felt off balance, leaning sensation assoc with regular fast heart beats for several minutes. Stood still, seemed to pass, then recurred briefly again. Was able to walk home, BP there 190/80, took an extra 20 mg ACE.  Pt denies increased sob or doe, wheezing, orthopnea, PND, increased LE swelling, palpitations or syncope, but had diffuse dull chest discomfort as well wtihout radiation or syptoms above. Echo 2011 with LAA, EF 60%, last stress test approx 8 yrs. Last cardiac monitor over 3 yrs. Had also had worsening reflux mild, but no abd pain, dysphagia, n/v, bowel change or blood. Had not taken recent decongestants or caffeine, or other OTC.   Event monitor, echo and stress Myoview were requested - have not been scheduled.   Comes here today. Here alone. He does not understand why he is here. Didn't really remember the sequence of his office visits from July. Does want his BP addressed - says it just stays high. On lower dose of ACE due to past hyperkalemia. No swelling reported by him. Continues to have some atypical chest pain - not exertional. Since his dizzy spell last month he has stopped walking and does not feel "safe" to exercise. Some DOE. Now on Metoprolol and the palpitations have improved. EKG at his last visit with Dr. Jonny Ruiz was reviewed and was ok.    Current Outpatient Prescriptions  Medication Sig Dispense Refill  . aspirin (ECOTRIN LOW STRENGTH) 81 MG EC  tablet Take 81 mg by mouth daily.        . CRESTOR 40 MG tablet TAKE ONE TABLET BY MOUTH EVERY DAY  90 tablet  2  . cyclobenzaprine (FLEXERIL) 5 MG tablet Take 1 tablet (5 mg total) by mouth 3 (three) times daily as needed.  90 tablet  3  . diclofenac (VOLTAREN) 75 MG EC tablet Take 75 mg by mouth as needed.       . fluticasone (FLONASE) 50 MCG/ACT nasal spray Place 2 sprays into the nose daily.  16 g  4  . lisinopril (PRINIVIL,ZESTRIL) 10 MG tablet Take 1 tablet (10 mg total) by mouth daily.  90 tablet  3  . metoprolol tartrate (LOPRESSOR) 25 MG tablet Take 1 tablet (25 mg total) by mouth 2 (two) times daily.  60 tablet  11  . omeprazole (PRILOSEC) 20 MG capsule Take 2 capsules (40 mg total) by mouth daily.  180 capsule  3  . sertraline (ZOLOFT) 50 MG tablet TAKE ONE TABLET BY MOUTH EVERY DAY  90 tablet  2  . sildenafil (VIAGRA) 100 MG tablet Take 1 tablet (100 mg total) by mouth as needed for erectile dysfunction.  10 tablet  11  . Testosterone (ANDROGEL PUMP) 20.25 MG/ACT (1.62%) GEL Place 40.5 application onto the skin daily.  75 g  4  . vitamin E 600 UNIT capsule Take 600 Units by mouth daily.        Marland Kitchen  ZETIA 10 MG tablet TAKE ONE TABLET BY MOUTH EVERY DAY  90 tablet  3   No current facility-administered medications for this visit.    Allergies  Allergen Reactions  . Cefuroxime Axetil     Past Medical History  Diagnosis Date  . GLUCOSE INTOLERANCE 09/16/2007  . HYPERCHOLESTEROLEMIA 03/24/2007  . HYPERLIPIDEMIA 09/16/2007  . OBESITY 03/24/2007  . ANXIETY 03/28/2007  . ERECTILE DYSFUNCTION 03/28/2007  . DEPRESSION 03/28/2007  . OTITIS MEDIA, LEFT 06/10/2009  . HYPERTENSION 03/28/2007  . CAROTID ARTERY STENOSIS, BILATERAL 07/01/2009  . PVD 06/25/2009  . HEMORRHOIDS, INTERNAL 03/24/2007  . URI 08/16/2009  . ALLERGIC RHINITIS 09/16/2007  . ESOPHAGITIS 03/24/2007  . ESOPHAGEAL STRICTURE 09/16/2007  . GERD 03/24/2007  . LOW BACK PAIN 03/28/2007  . Dizziness and giddiness 06/08/2009  .  RASH-NONVESICULAR 06/17/2009  . CEPHALGIA 06/08/2009  . DYSPNEA/SHORTNESS OF BREATH 09/16/2007  . CHEST PAIN 09/16/2007  . MAGNETIC RESONANCE IMAGING, BRAIN, ABNORMAL 06/26/2009  . ALLERGY 03/24/2007  . COLONIC POLYPS, HX OF 09/16/2007  . NECK PAIN, LEFT 06/20/2010  . Palpitations 06/20/2010  . Impaired glucose tolerance 10/04/2010  . 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 Tonsillectomy    . S/p right knee arthroscopy      History  Smoking status  . Former Smoker  Smokeless tobacco  . Never Used    History  Alcohol Use  . Yes    Family History  Problem Relation Age of Onset  . Lymphoma Mother   . Heart disease Father     CHF  . Ovarian cancer Sister   . Lung cancer Sister   . Coronary artery disease Sister     CABG  . Coronary artery disease Brother     stent, and CAD    Review of Systems: The review of systems is per the HPI.  All other systems were reviewed and are negative.  Physical Exam: BP 160/100  Pulse 72  Ht 5' 9.5" (1.765 m)  Wt 241 lb 1.9 oz (109.371 kg)  BMI 35.11 kg/m2 Patient is very pleasant and in no acute distress. Skin is warm and dry. Color is normal.  HEENT is unremarkable. Normocephalic/atraumatic. PERRL. Sclera are nonicteric. Neck is supple. No masses. No JVD. Lungs are clear. Cardiac exam shows a regular rate and rhythm. Abdomen is soft. Extremities are with 1+ edema bilaterally. Gait and ROM are intact. No gross neurologic deficits noted.  LABORATORY DATA:  Lab Results  Component Value Date   WBC 6.6 01/18/2013   HGB 16.1 01/18/2013   HCT 48.6 01/18/2013   PLT 190.0 01/18/2013   GLUCOSE 103* 01/26/2013   CHOL 179 10/11/2012   TRIG 117.0 10/11/2012   HDL 60.70 10/11/2012   LDLDIRECT 176.0 04/03/2011   LDLCALC 95 10/11/2012   ALT 36 01/18/2013   AST 29 01/18/2013   NA 139 01/26/2013   K 4.5 01/26/2013   CL 104 01/26/2013   CREATININE 1.0 01/26/2013   BUN 15 01/26/2013   CO2 26 01/26/2013   TSH 2.50  01/18/2013   PSA 0.20 10/11/2012   HGBA1C 6.5 10/11/2012     Assessment / Plan: 1. HTN - BP continues to be elevated - he has edema on exam - I have added HCTZ 25 mg daily. He will monitor at home. Bring cuff back in to check for correlation. Restrict salt. Check echo as planned by Dr. Jonny Ruiz. I have asked him to limit his use of Voltaren.  2. Dizziness - sporadic occurrence - not daily - will update his event monitor as ordered by Dr. Jonny Ruiz.   3. Chest pain - has multiple CV risk factors (FH, gender, HTN, PVD, HLD) - will update his Myoview.   Will get him back to see Dr. Ladona Ridgel after his event monitor. Check BMET on return visit.   Patient is agreeable to this plan and will call if any problems develop in the interim.   Rosalio Macadamia, RN, ANP-C  [facility] [address] [place], [place]  [address]

## 2013-02-09 ENCOUNTER — Other Ambulatory Visit (HOSPITAL_COMMUNITY): Payer: Self-pay | Admitting: Internal Medicine

## 2013-02-09 DIAGNOSIS — R072 Precordial pain: Secondary | ICD-10-CM

## 2013-02-10 ENCOUNTER — Ambulatory Visit (HOSPITAL_COMMUNITY): Payer: BC Managed Care – PPO | Attending: Cardiology | Admitting: Radiology

## 2013-02-10 DIAGNOSIS — Z87891 Personal history of nicotine dependence: Secondary | ICD-10-CM | POA: Insufficient documentation

## 2013-02-10 DIAGNOSIS — E785 Hyperlipidemia, unspecified: Secondary | ICD-10-CM | POA: Insufficient documentation

## 2013-02-10 DIAGNOSIS — I10 Essential (primary) hypertension: Secondary | ICD-10-CM | POA: Insufficient documentation

## 2013-02-10 DIAGNOSIS — R072 Precordial pain: Secondary | ICD-10-CM | POA: Insufficient documentation

## 2013-02-10 DIAGNOSIS — R079 Chest pain, unspecified: Secondary | ICD-10-CM

## 2013-02-10 DIAGNOSIS — E669 Obesity, unspecified: Secondary | ICD-10-CM | POA: Insufficient documentation

## 2013-02-10 NOTE — Progress Notes (Signed)
Echocardiogram performed.  

## 2013-02-16 ENCOUNTER — Telehealth: Payer: Self-pay | Admitting: *Deleted

## 2013-02-16 MED ORDER — HYDROCORTISONE ACETATE 25 MG RE SUPP: 25.0000 mg | Freq: Two times a day (BID) | RECTAL | Status: DC

## 2013-02-16 NOTE — Telephone Encounter (Signed)
Pt called states he is having hemorrhoidal pain and Preparation H is not effective.  Pt is requesting a Rx.  Please advise

## 2013-02-16 NOTE — Telephone Encounter (Signed)
Done erx 

## 2013-02-17 NOTE — Telephone Encounter (Signed)
Patient informed. 

## 2013-02-18 ENCOUNTER — Other Ambulatory Visit: Payer: Self-pay | Admitting: Internal Medicine

## 2013-02-21 ENCOUNTER — Encounter (HOSPITAL_COMMUNITY): Payer: BC Managed Care – PPO

## 2013-02-22 ENCOUNTER — Ambulatory Visit (HOSPITAL_COMMUNITY): Payer: BC Managed Care – PPO | Attending: Cardiovascular Disease | Admitting: Radiology

## 2013-02-22 VITALS — BP 159/84 | HR 64 | Ht 69.5 in | Wt 236.0 lb

## 2013-02-22 DIAGNOSIS — Z87891 Personal history of nicotine dependence: Secondary | ICD-10-CM | POA: Insufficient documentation

## 2013-02-22 DIAGNOSIS — R079 Chest pain, unspecified: Secondary | ICD-10-CM

## 2013-02-22 DIAGNOSIS — I10 Essential (primary) hypertension: Secondary | ICD-10-CM | POA: Insufficient documentation

## 2013-02-22 DIAGNOSIS — R002 Palpitations: Secondary | ICD-10-CM | POA: Insufficient documentation

## 2013-02-22 DIAGNOSIS — R55 Syncope and collapse: Secondary | ICD-10-CM | POA: Insufficient documentation

## 2013-02-22 DIAGNOSIS — I739 Peripheral vascular disease, unspecified: Secondary | ICD-10-CM | POA: Insufficient documentation

## 2013-02-22 DIAGNOSIS — E785 Hyperlipidemia, unspecified: Secondary | ICD-10-CM | POA: Insufficient documentation

## 2013-02-22 DIAGNOSIS — R42 Dizziness and giddiness: Secondary | ICD-10-CM | POA: Insufficient documentation

## 2013-02-22 DIAGNOSIS — R0789 Other chest pain: Secondary | ICD-10-CM | POA: Insufficient documentation

## 2013-02-22 DIAGNOSIS — R5381 Other malaise: Secondary | ICD-10-CM | POA: Insufficient documentation

## 2013-02-22 DIAGNOSIS — R000 Tachycardia, unspecified: Secondary | ICD-10-CM | POA: Insufficient documentation

## 2013-02-22 DIAGNOSIS — Z8249 Family history of ischemic heart disease and other diseases of the circulatory system: Secondary | ICD-10-CM | POA: Insufficient documentation

## 2013-02-22 DIAGNOSIS — I779 Disorder of arteries and arterioles, unspecified: Secondary | ICD-10-CM | POA: Insufficient documentation

## 2013-02-22 MED ORDER — TECHNETIUM TC 99M SESTAMIBI GENERIC - CARDIOLITE
30.0000 | Freq: Once | INTRAVENOUS | Status: AC | PRN
  Administered 2013-02-22: 30 via INTRAVENOUS

## 2013-02-22 MED ORDER — REGADENOSON 0.4 MG/5ML IV SOLN
0.4000 mg | Freq: Once | INTRAVENOUS | Status: AC
  Administered 2013-02-22: 0.4 mg via INTRAVENOUS

## 2013-02-22 MED ORDER — TECHNETIUM TC 99M SESTAMIBI GENERIC - CARDIOLITE
10.0000 | Freq: Once | INTRAVENOUS | Status: AC | PRN
  Administered 2013-02-22: 10 via INTRAVENOUS

## 2013-02-22 NOTE — Progress Notes (Signed)
[facility] SITE 3 NUCLEAR MED [address] [place], [place] [address] [phone]    Cardiology Nuclear Med Study  Warren Weeks is a 67 y.o. male     MRN : [id number]     DOB: 05/15/1946  Procedure Date: 02/22/2013  Nuclear Med Background Indication for Stress Test:  Evaluation for Ischemia History:  '09 MPS:no ischemia, EF=66%; 02/10/13 Echo:EF=60% Cardiac Risk Factors: Carotid Disease, Family History - CAD, History of Smoking, Hypertension, Lipids and PVD  Symptoms:  Chest Pain (last episode of chest discomfort was about a week ago), Dizziness, Fatigue, Near Syncope, Palpitations and Rapid HR   Nuclear Pre-Procedure Caffeine/Decaff Intake:  None > 12 hrs NPO After: 9:00pm   Lungs:  Clear. O2 Sat: 95% on room air. IV 0.9% NS with Angio Cath:  22g  IV Site: R Antecubital x 1, tolerated well IV Started by:  Irean Hong, RN  Chest Size (in):  50 Cup Size: n/a  Height: 5' 9.5" (1.765 m)  Weight:  236 lb (107.049 kg)  BMI:  Body mass index is 34.36 kg/(m^2). Tech Comments:  Took Lisinopril and Metoprolol this am    Nuclear Med Study 1 or 2 day study: 1 day  Stress Test Type:  Lexiscan  Reading MD: Kristeen Miss, MD  Order Authorizing Provider:  Lewayne Bunting, MD  Resting Radionuclide: Technetium 19m Sestamibi  Resting Radionuclide Dose: 11.0 mCi   Stress Radionuclide:  Technetium 74m Sestamibi  Stress Radionuclide Dose: 33.0 mCi           Stress Protocol Rest HR: 64 Stress HR: 130  Rest BP: 159/84 Stress BP: 196/86  Exercise Time (min): n/a METS: n/a   Predicted Max HR: 156 bpm % Max HR: 83.33 bpm Rate Pressure Product: 03474   Dose of Adenosine (mg):  n/a Dose of Lexiscan: 0.4 mg  Dose of Atropine (mg): n/a Dose of Dobutamine: n/a mcg/kg/min (at max HR)  Stress Test Technologist: Smiley Houseman, CMA-N  Nuclear Technologist:  Domenic Polite, CNMT     Rest Procedure:  Myocardial perfusion imaging was performed at rest 45 minutes following the intravenous  administration of Technetium 37m Sestamibi.  Rest ECG: NSR - Normal EKG  Stress Procedure:  The patient received IV Lexiscan 0.4 mg over 15-seconds.  Technetium 64m Sestamibi injected at 30-seconds.  Quantitative spect images were obtained after a 45 minute delay.  Stress ECG: No significant change from baseline ECG  QPS Raw Data Images:  Normal; no motion artifact; normal heart/lung ratio. Stress Images:  Normal homogeneous uptake in all areas of the myocardium. Rest Images:  Normal homogeneous uptake in all areas of the myocardium. Subtraction (SDS):  No evidence of ischemia. Transient Ischemic Dilatation (Normal <1.22):  n/a Lung/Heart Ratio (Normal <0.45):  0.53  Quantitative Gated Spect Images QGS EDV:  111 ml QGS ESV:  49 ml  Impression Exercise Capacity:  Lexiscan with no exercise. BP Response:  Normal blood pressure response. Clinical Symptoms:  No significant symptoms noted. ECG Impression:  No significant ST segment change suggestive of ischemia. Comparison with Prior Nuclear Study: No significant change from previous study 09/23/07  Overall Impression:  Normal stress nuclear study.  No evidence of ischemia.  LV Ejection Fraction: 56%.  LV Wall Motion:  NL LV Function; NL Wall Motion.   Vesta Mixer, Montez Hageman., MD, [facility] 02/22/2013, 5:33 PM Office - [phone] Pager [phone]

## 2013-03-09 ENCOUNTER — Telehealth: Payer: Self-pay

## 2013-03-09 NOTE — Telephone Encounter (Signed)
I can refer to GI if ok with pt

## 2013-03-09 NOTE — Telephone Encounter (Signed)
The patient has  Completed his prescription of suppositories.  He is still having rectal pain and prep H does not seem to be helping.  Please advise

## 2013-03-10 NOTE — Telephone Encounter (Signed)
Patient does not want a referral at this time.  Will see how he does over the weekend.

## 2013-03-13 ENCOUNTER — Encounter: Payer: Self-pay | Admitting: Internal Medicine

## 2013-03-14 ENCOUNTER — Ambulatory Visit (INDEPENDENT_AMBULATORY_CARE_PROVIDER_SITE_OTHER): Payer: BC Managed Care – PPO | Admitting: Internal Medicine

## 2013-03-14 ENCOUNTER — Encounter: Payer: Self-pay | Admitting: Internal Medicine

## 2013-03-14 VITALS — BP 159/93 | HR 61 | Ht 69.5 in | Wt 238.0 lb

## 2013-03-14 DIAGNOSIS — R002 Palpitations: Secondary | ICD-10-CM

## 2013-03-14 DIAGNOSIS — I10 Essential (primary) hypertension: Secondary | ICD-10-CM

## 2013-03-14 NOTE — Patient Instructions (Addendum)
Your physician wants you to follow-up in: 6 months with Dr Taylor You will receive a reminder letter in the mail two months in advance. If you don't receive a letter, please call our office to schedule the follow-up appointment.  

## 2013-03-15 ENCOUNTER — Encounter: Payer: Self-pay | Admitting: Internal Medicine

## 2013-03-15 NOTE — Assessment & Plan Note (Signed)
He is asymptomatic. Do not think the episode of dizziness that he had several weeks ago was related to his carotid vascular disease. He'll need followup carotid ultrasound in the next 6-12 months.

## 2013-03-15 NOTE — Assessment & Plan Note (Signed)
His blood pressure remains elevated but is improved. We discussed the importance of a low-sodium diet, I've asked the patient to continue to follow his blood pressures at home, and take these to his primary physician for additional medical titration of his antihypertensive medications.

## 2013-03-15 NOTE — Assessment & Plan Note (Signed)
The etiology is unclear though I suspect he is having PVCs or PACs. No evidence at this point of any sustained atrial or ventricular arrhythmias. He will undergo watchful waiting.

## 2013-03-15 NOTE — Progress Notes (Signed)
HPI Mr. Phommachanh returns today for followup. He is a very pleasant 67 year old man with a history of hypertension, carotid vascular disease, and palpitations. He worked cardiac monitor, which demonstrated no sustained arrhythmias. The patient has had no syncope. His palpitations have improved. He does have a history of carotid vascular disease. He was seen in our office just over a month ago with increasing peripheral edema and hypertension, and treated with additional diuretic therapy. Overall he is improved. His palpitations are improved. He denies fevers or chills. No dietary indiscretion with sodium after his last visit. Allergies  Allergen Reactions  . Cefuroxime Axetil      Current Outpatient Prescriptions  Medication Sig Dispense Refill  . aspirin (ECOTRIN LOW STRENGTH) 81 MG EC tablet Take 81 mg by mouth daily.        . CRESTOR 40 MG tablet TAKE ONE TABLET BY MOUTH EVERY DAY  90 tablet  2  . cyclobenzaprine (FLEXERIL) 5 MG tablet Take 1 tablet (5 mg total) by mouth 3 (three) times daily as needed.  90 tablet  3  . diclofenac (VOLTAREN) 75 MG EC tablet Take 75 mg by mouth as needed.       . fluticasone (FLONASE) 50 MCG/ACT nasal spray Place 2 sprays into the nose daily.  16 g  4  . hydrochlorothiazide (HYDRODIURIL) 25 MG tablet Take 1 tablet (25 mg total) by mouth daily.  90 tablet  3  . hydrocortisone (ANUSOL-HC) 25 MG suppository Place 1 suppository (25 mg total) rectally every 12 (twelve) hours.  12 suppository  1  . lisinopril (PRINIVIL,ZESTRIL) 10 MG tablet Take 1 tablet (10 mg total) by mouth daily.  90 tablet  3  . metoprolol tartrate (LOPRESSOR) 25 MG tablet Take 1 tablet (25 mg total) by mouth 2 (two) times daily.  60 tablet  11  . omeprazole (PRILOSEC) 20 MG capsule Take 2 capsules (40 mg total) by mouth daily.  180 capsule  3  . sildenafil (VIAGRA) 100 MG tablet Take 1 tablet (100 mg total) by mouth as needed for erectile dysfunction.  10 tablet  11  . Testosterone (ANDROGEL PUMP)  20.25 MG/ACT (1.62%) GEL Place 40.5 application onto the skin daily.  75 g  4  . vitamin E 600 UNIT capsule Take 600 Units by mouth daily.        Marland Kitchen ZETIA 10 MG tablet TAKE ONE TABLET BY MOUTH EVERY DAY  90 tablet  3  . sertraline (ZOLOFT) 50 MG tablet TAKE ONE TABLET BY MOUTH EVERY DAY  90 tablet  2   No current facility-administered medications for this visit.     Past Medical History  Diagnosis Date  . GLUCOSE INTOLERANCE 09/16/2007  . HYPERCHOLESTEROLEMIA 03/24/2007  . HYPERLIPIDEMIA 09/16/2007  . OBESITY 03/24/2007  . ANXIETY 03/28/2007  . ERECTILE DYSFUNCTION 03/28/2007  . DEPRESSION 03/28/2007  . OTITIS MEDIA, LEFT 06/10/2009  . HYPERTENSION 03/28/2007  . CAROTID ARTERY STENOSIS, BILATERAL 07/01/2009  . PVD 06/25/2009  . HEMORRHOIDS, INTERNAL 03/24/2007  . URI 08/16/2009  . ALLERGIC RHINITIS 09/16/2007  . ESOPHAGITIS 03/24/2007  . ESOPHAGEAL STRICTURE 09/16/2007  . GERD 03/24/2007  . LOW BACK PAIN 03/28/2007  . Dizziness and giddiness 06/08/2009  . RASH-NONVESICULAR 06/17/2009  . CEPHALGIA 06/08/2009  . DYSPNEA/SHORTNESS OF BREATH 09/16/2007  . CHEST PAIN 09/16/2007  . MAGNETIC RESONANCE IMAGING, BRAIN, ABNORMAL 06/26/2009  . ALLERGY 03/24/2007  . COLONIC POLYPS, HX OF 09/16/2007  . NECK PAIN, LEFT 06/20/2010  . Palpitations 06/20/2010  . Impaired glucose tolerance  10/04/2010  . 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   Past Surgical History  Procedure Laterality Date  . Tonsillectomy    . S/p right knee arthroscopy       Family History  Problem Relation Age of Onset  . Lymphoma Mother   . Heart disease Father     CHF  . Ovarian cancer Sister   . Lung cancer Sister   . Coronary artery disease Sister     CABG  . Coronary artery disease Brother     stent, and CAD     History   Social History  . Marital Status: Married    Spouse Name: N/A    Number of Children: 2  . Years of Education: N/A    Occupational History  . ship and receive manager    Social History Main Topics  . Smoking status: Former Games developer  . Smokeless tobacco: Never Used  . Alcohol Use: Yes     Comment: beer most days  . Drug Use: No  . Sexual Activity: Not on file   Other Topics Concern  . Not on file   Social History Narrative  . No narrative on file     BP 159/93  Pulse 61  Ht 5' 9.5" (1.765 m)  Wt 238 lb (107.956 kg)  BMI 34.65 kg/m2  Physical Exam:  Well appearing 67 year old man,NAD HEENT: Unremarkable Neck:  7 cm JVD, no thyromegally Back:  No CVA tenderness Lungs:  Clear with no wheezes, rales, or rhonchi. HEART:  Regular rate rhythm, no murmurs, no rubs, no clicks Abd:  soft, positive bowel sounds, no organomegally, no rebound, no guarding Ext:  2 plus pulses, no edema, no cyanosis, no clubbing Skin:  No rashes no nodules Neuro:  CN II through XII intact, motor grossly intact   Cardiac monitor - no sustained arrhythmias  Assess/Plan:

## 2013-03-22 HISTORY — PX: COLONOSCOPY: SHX174

## 2013-03-23 ENCOUNTER — Ambulatory Visit: Payer: BC Managed Care – PPO | Admitting: Internal Medicine

## 2013-03-27 ENCOUNTER — Telehealth: Payer: Self-pay | Admitting: Internal Medicine

## 2013-03-27 ENCOUNTER — Encounter: Payer: Self-pay | Admitting: Internal Medicine

## 2013-03-27 NOTE — Telephone Encounter (Signed)
Pt states he is having rectal pain. States he took some "steroid supp" that his PCP gave him but they did not help. Pt states he has the pain especially after a bowel movement and he is now taking a stool softener.

## 2013-03-29 ENCOUNTER — Encounter: Payer: Self-pay | Admitting: Internal Medicine

## 2013-03-29 ENCOUNTER — Telehealth: Payer: Self-pay | Admitting: Physician Assistant

## 2013-03-29 ENCOUNTER — Other Ambulatory Visit: Payer: Self-pay | Admitting: *Deleted

## 2013-03-29 ENCOUNTER — Ambulatory Visit (INDEPENDENT_AMBULATORY_CARE_PROVIDER_SITE_OTHER): Payer: BC Managed Care – PPO | Admitting: Physician Assistant

## 2013-03-29 ENCOUNTER — Encounter: Payer: Self-pay | Admitting: Physician Assistant

## 2013-03-29 VITALS — BP 130/86 | HR 78 | Ht 70.0 in | Wt 239.6 lb

## 2013-03-29 DIAGNOSIS — Z860100 Personal history of colon polyps, unspecified: Secondary | ICD-10-CM

## 2013-03-29 DIAGNOSIS — Z860101 Personal history of adenomatous and serrated colon polyps: Secondary | ICD-10-CM

## 2013-03-29 DIAGNOSIS — K6289 Other specified diseases of anus and rectum: Secondary | ICD-10-CM

## 2013-03-29 MED ORDER — LIDOCAINE HCL 3 % EX CREA: TOPICAL_CREAM | CUTANEOUS | Status: DC

## 2013-03-29 MED ORDER — MOVIPREP 100 G PO SOLR: 1.0000 | Freq: Once | ORAL | Status: DC

## 2013-03-29 NOTE — Progress Notes (Signed)
Subjective:    Patient ID: Warren Weeks, male    DOB: 03-18-1946, 67 y.o.   MRN: [id number]  HPI  Warren Weeks is a pleasant 67 year old male known to Dr. Yancey Flemings from prior colonoscopies. He comes in today with new complaint of rectal pain. Patient has history of hypertension, GERD, peripheral vascular disease, and hyperlipidemia. He had colonoscopy in October of 2009 was found to have 2 polyps and mild sigmoid diverticulosis no hemorrhoids were noted. Path of the polyps was consistent with tubular adenomas. Patient states that he has been having rectal discomfort at least over the past one month. He has been using Anusol HC suppositories over the past 2 weeks and has not noted any improvement in his symptoms. He says he is more uncomfortable at night generally and says if he lies down and on his side sometimes that helps relieve the discomfort. He has not noted any bleeding. He has complained of occasional sharp shooting pains inside his rectum and on further questioning states that he has felt some discomfort into his testicles as well. He did have one previous episode of prostatitis he says many years ago. He does not have any other urinary symptoms currently. He has not noticed any increase in his rectal pain with bowel movements but says the pain is just intermittent maybe activated by an additional bowel movements during the day.  Review of Systems  Constitutional: Negative.   HENT: Negative.   Eyes: Negative.   Respiratory: Negative.   Cardiovascular: Negative.   Endocrine: Negative.   Genitourinary: Positive for testicular pain.  Allergic/Immunologic: Negative.   Neurological: Negative.   Psychiatric/Behavioral: Negative.     Outpatient Prescriptions Prior to Visit  Medication Sig Dispense Refill  . aspirin (ECOTRIN LOW STRENGTH) 81 MG EC tablet Take 81 mg by mouth daily.        . CRESTOR 40 MG tablet TAKE ONE TABLET BY MOUTH EVERY DAY  90 tablet  2  . cyclobenzaprine (FLEXERIL) 5 MG  tablet Take 1 tablet (5 mg total) by mouth 3 (three) times daily as needed.  90 tablet  3  . diclofenac (VOLTAREN) 75 MG EC tablet Take 75 mg by mouth as needed.       . fluticasone (FLONASE) 50 MCG/ACT nasal spray Place 2 sprays into the nose daily.  16 g  4  . hydrochlorothiazide (HYDRODIURIL) 25 MG tablet Take 1 tablet (25 mg total) by mouth daily.  90 tablet  3  . lisinopril (PRINIVIL,ZESTRIL) 10 MG tablet Take 1 tablet (10 mg total) by mouth daily.  90 tablet  3  . metoprolol tartrate (LOPRESSOR) 25 MG tablet Take 1 tablet (25 mg total) by mouth 2 (two) times daily.  60 tablet  11  . omeprazole (PRILOSEC) 20 MG capsule Take 2 capsules (40 mg total) by mouth daily.  180 capsule  3  . sertraline (ZOLOFT) 50 MG tablet TAKE ONE TABLET BY MOUTH EVERY DAY  90 tablet  2  . sildenafil (VIAGRA) 100 MG tablet Take 1 tablet (100 mg total) by mouth as needed for erectile dysfunction.  10 tablet  11  . Testosterone (ANDROGEL PUMP) 20.25 MG/ACT (1.62%) GEL Place 40.5 application onto the skin daily.  75 g  4  . vitamin E 600 UNIT capsule Take 600 Units by mouth daily.        Marland Kitchen ZETIA 10 MG tablet TAKE ONE TABLET BY MOUTH EVERY DAY  90 tablet  3  . hydrocortisone (ANUSOL-HC) 25 MG suppository Place  1 suppository (25 mg total) rectally every 12 (twelve) hours.  12 suppository  1   No facility-administered medications prior to visit.   Allergies  Allergen Reactions  . Cefuroxime Axetil         Patient Active Problem List   Diagnosis Date Noted  . Chest pain 01/18/2013  . Hematochezia 10/12/2012  . Viral illness 05/26/2012  . Palpitations 01/05/2012  . 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 Fatigue 04/09/2011  . Rash 12/22/2010  . Encounter for long-term (current) use of high-risk medication 10/08/2010  . Impaired glucose tolerance 10/04/2010  . Preventative health care 10/04/2010  . CAROTID ARTERY STENOSIS, BILATERAL 07/01/2009  . PVD 06/25/2009  . ALLERGIC RHINITIS 09/16/2007  . ESOPHAGEAL  STRICTURE 09/16/2007  . COLONIC POLYPS, HX OF 09/16/2007  . ANXIETY 03/28/2007  . ERECTILE DYSFUNCTION 03/28/2007  . DEPRESSION 03/28/2007  . HYPERTENSION 03/28/2007  . LOW BACK PAIN 03/28/2007  . HYPERCHOLESTEROLEMIA 03/24/2007  . OBESITY 03/24/2007  . HEMORRHOIDS, INTERNAL 03/24/2007  . ESOPHAGITIS 03/24/2007  . GERD 03/24/2007   History   Social History Narrative  . No narrative on file   family history includes Coronary artery disease in his brother and sister; Heart disease in his father; Lung cancer in his sister; Lymphoma in his mother; Ovarian cancer in his sister.  Objective:   Physical Exam well-developed male in no acute distress, pleasant blood pressure 130/66 pulse 78 height 5 foot 10 weight 239. HEENT nontraumatic normocephalic EOMI PERRLA sclera anicteric, Supple no JVD, Cardiovascular regular rate and rhythm with S1-S2 no murmur or gallop, Pulmonary clear bilaterally, Abdomen soft nontender nondistended bowel sounds are active there is no palpable mass or hepatosplenomegaly, Rectal no external lesion noted on digital exam is tender but there is no palpable mass or enlarged hemorrhoid he does not have any significant tenderness to palpation of the prostate,, On anoscopy he does have some erythema noted but no obvious hemorrhoids., Patient was uncomfortable and exam was limited. Extremities no clubbing cyanosis or edema skin warm and dry, Psych mood and affect normal and appropriate.        Assessment & Plan:  #58  67 year old male with one month history of internal rectal pain, etiology not entirely clear no definite enlarged or inflamed internal hemorrhoids noted on anoscopy. Thus far unresponsive to trial of Anusol HC. Will need to rule out a proctitis or occult lesion and to GI workup is unrevealing will need urologic evaluation for prostate disease #2 history of adenomatous colon polyps due for followup colonoscopy #3 hypertension #4 peripheral vascular  disease  Plan; Will give patient a trial of ana  mantle cream to be applied in internal a 3-4 times daily hopefully with addition of anesthetic this will help his symptoms Schedule for colonoscopy with Dr. Garner Gavel discussed in detail with patient and he is agreeable to proceed We have also made him a referral appointment with [facility].

## 2013-03-29 NOTE — Patient Instructions (Signed)
You have been scheduled for a colonoscopy with propofol. Please follow written instructions given to you at your visit today.  Please pick up your prep kit at the pharmacy within the next 1-3 days. If you use inhalers (even only as needed), please bring them with you on the day of your procedure.  

## 2013-03-29 NOTE — Progress Notes (Signed)
Agree with initial assessment and plan. Empiric therapy reasonable. He is due for colonoscopy anyway, we can evaluate for other causes. Indeed, discomfort could be urologic but not obviously so.

## 2013-03-31 ENCOUNTER — Telehealth: Payer: Self-pay | Admitting: *Deleted

## 2013-03-31 NOTE — Telephone Encounter (Signed)
Called [facility] advising them we put in for a referral for them for this patient.  The diagnosis is Prostatitis.  They gave me an appointment with Dr. Brunilda Payor for 04-21-2013 at 1:30 PM .  I gave them the patient's demographics.

## 2013-04-04 ENCOUNTER — Telehealth: Payer: Self-pay | Admitting: Internal Medicine

## 2013-04-04 NOTE — Telephone Encounter (Signed)
Spoke with pt regarding the Moviprep will cost him more than  $70.00 with his insurance and he can not afford it.Pt was informed to come to our office to pick up a voucher for a free Moviprep. He states he will come by today to pick up the voucher.He will call our office if he has any more questions or problems.

## 2013-04-06 ENCOUNTER — Encounter: Payer: Self-pay | Admitting: Internal Medicine

## 2013-04-06 ENCOUNTER — Telehealth: Payer: Self-pay | Admitting: *Deleted

## 2013-04-06 ENCOUNTER — Ambulatory Visit (AMBULATORY_SURGERY_CENTER): Payer: BC Managed Care – PPO | Admitting: Internal Medicine

## 2013-04-06 VITALS — BP 155/68 | HR 66 | Temp 98.0°F | Resp 16 | Ht 70.0 in | Wt 239.0 lb

## 2013-04-06 DIAGNOSIS — D126 Benign neoplasm of colon, unspecified: Secondary | ICD-10-CM

## 2013-04-06 DIAGNOSIS — Z86010 Personal history of colonic polyps: Secondary | ICD-10-CM

## 2013-04-06 MED ORDER — SODIUM CHLORIDE 0.9 % IV SOLN: 500.0000 mL | INTRAVENOUS | Status: DC

## 2013-04-06 NOTE — Progress Notes (Signed)
No complaints noted in the recovery room. Maw   

## 2013-04-06 NOTE — Patient Instructions (Signed)
YOU HAD AN ENDOSCOPIC PROCEDURE TODAY AT THE [facility]: Refer to the procedure report that was given to you for any specific questions about what was found during the examination.  If the procedure report does not answer your questions, please call your gastroenterologist to clarify.  If you requested that your care partner not be given the details of your procedure findings, then the procedure report has been included in a sealed envelope for you to review at your convenience later.  YOU SHOULD EXPECT: Some feelings of bloating in the abdomen. Passage of more gas than usual.  Walking can help get rid of the air that was put into your GI tract during the procedure and reduce the bloating. If you had a lower endoscopy (such as a colonoscopy or flexible sigmoidoscopy) you may notice spotting of blood in your stool or on the toilet paper. If you underwent a bowel prep for your procedure, then you may not have a normal bowel movement for a few days.  DIET: Your first meal following the procedure should be a light meal and then it is ok to progress to your normal diet.  A half-sandwich or bowl of soup is an example of a good first meal.  Heavy or fried foods are harder to digest and may make you feel nauseous or bloated.  Likewise meals heavy in dairy and vegetables can cause extra gas to form and this can also increase the bloating.  Drink plenty of fluids but you should avoid alcoholic beverages for 24 hours.  ACTIVITY: Your care partner should take you home directly after the procedure.  You should plan to take it easy, moving slowly for the rest of the day.  You can resume normal activity the day after the procedure however you should NOT DRIVE or use heavy machinery for 24 hours (because of the sedation medicines used during the test).    SYMPTOMS TO REPORT IMMEDIATELY: A gastroenterologist can be reached at any hour.  During normal business hours, 8:30 AM to 5:00 PM Monday through Friday,  call [phone].  After hours and on weekends, please call the GI answering service at [phone] who will take a message and have the physician on call contact you.   Following lower endoscopy (colonoscopy or flexible sigmoidoscopy):  Excessive amounts of blood in the stool  Significant tenderness or worsening of abdominal pains  Swelling of the abdomen that is new, acute  Fever of 100F or higher   FOLLOW UP: If any biopsies were taken you will be contacted by phone or by letter within the next 1-3 weeks.  Call your gastroenterologist if you have not heard about the biopsies in 3 weeks.  Our staff will call the home number listed on your records the next business day following your procedure to check on you and address any questions or concerns that you may have at that time regarding the information given to you following your procedure. This is a courtesy call and so if there is no answer at the home number and we have not heard from you through the emergency physician on call, we will assume that you have returned to your regular daily activities without incident.  SIGNATURES/CONFIDENTIALITY: You and/or your care partner have signed paperwork which will be entered into your electronic medical record.  These signatures attest to the fact that that the information above on your After Visit Summary has been reviewed and is understood.  Full responsibility of the confidentiality of   this discharge information lies with you and/or your care-partner.  Polyp-handout given  Repeat colonoscopy will be determined by pathology   

## 2013-04-06 NOTE — Progress Notes (Signed)
Lidocaine-40mg IV prior to Propofol InductionPropofol given over incremental dosages 

## 2013-04-06 NOTE — Progress Notes (Signed)
Called to room to assist during endoscopic procedure.  Patient ID and intended procedure confirmed with present staff. Received instructions for my participation in the procedure from the performing physician.  

## 2013-04-06 NOTE — Telephone Encounter (Signed)
I called the patient and asked him if he knew about the appointment with [facility]. He said he did get a letter with the appointment date of 04-21-2013 at 1:30PM informing him to come 15 min early to fill out paperwork. I told him I just wanted to be sure he got his appointment information. We had referred him to Urology when he saw Mike Gip PA-C on 03-29-2013.

## 2013-04-06 NOTE — Op Note (Signed)
[facility] [address] [place], [address]   COLONOSCOPY PROCEDURE REPORT  PATIENT: Warren, Weeks  MR#: [id number] BIRTHDATE: 1945/11/17 , 66  yrs. old GENDER: Male ENDOSCOPIST: Roxy Cedar, MD REFERRED ON:[id number] Program Recall PROCEDURE DATE:  04/06/2013 PROCEDURE:   Colonoscopy with snare polypectomy x 1 First Screening Colonoscopy - Avg.  risk and is 50 yrs.  old or older - No.  Prior Negative Screening - Now for repeat screening. N/A  History of Adenoma - Now for follow-up colonoscopy & has been > or = to 3 yrs.  Yes hx of adenoma.  Has been 3 or more years since last colonoscopy.  Polyps Removed Today? Yes. ASA CLASS:   Class II INDICATIONS:Patient's personal history of adenomatous colon polyps. Index 2006 (large prox HPP); 2009 (small TAs) MEDICATIONS: MAC sedation, administered by CRNA and propofol (Diprivan) 250mg  IV  DESCRIPTION OF PROCEDURE:   After the risks benefits and alternatives of the procedure were thoroughly explained, informed consent was obtained.  A digital rectal exam revealed no abnormalities of the rectum.   The LB [id number]  endoscope was introduced through the anus and advanced to the cecum, which was identified by both the appendix and ileocecal valve. No adverse events experienced.   The quality of the prep was excellent, using MoviPrep  The instrument was then slowly withdrawn as the colon was fully examined.  COLON FINDINGS: A diminutive polyp was found in the ascending colon. A polypectomy was performed with a cold snare.  The resection was complete and the polyp tissue was completely retrieved.   The colon mucosa was otherwise normal.  Retroflexed views revealed internal hemorrhoids. The time to cecum=1 minutes 50 seconds.  Withdrawal time=10 minutes 40 seconds.  The scope was withdrawn and the procedure completed. COMPLICATIONS: There were no complications.  ENDOSCOPIC IMPRESSION: 1.   Diminutive polyp  was found in the ascending colon; polypectomy was performed with a cold snare 2.   The colon mucosa was otherwise normal  RECOMMENDATIONS: 1. Repeat colonoscopy in 5 years if polyp adenomatous; otherwise 10 years   eSigned:  Roxy Cedar, MD 04/06/2013 11:54 AM   cc: Corwin Levins, MD and The Patient

## 2013-04-07 ENCOUNTER — Other Ambulatory Visit: Payer: Self-pay | Admitting: Internal Medicine

## 2013-04-07 ENCOUNTER — Telehealth: Payer: Self-pay

## 2013-04-07 NOTE — Telephone Encounter (Signed)
  Follow up Call-  Call back number 04/06/2013  Post procedure Call Back phone  # [phone] hm  Permission to leave phone message Yes     Patient questions:  Do you have a fever, pain , or abdominal swelling? no Pain Score  0 *  Have you tolerated food without any problems? yes  Have you been able to return to your normal activities? yes  Do you have any questions about your discharge instructions: Diet   no Medications  no Follow up visit  no  Do you have questions or concerns about your Care? no  Actions: * If pain score is 4 or above: No action needed, pain <4.  No problems per the pt. Maw

## 2013-04-10 ENCOUNTER — Other Ambulatory Visit: Payer: Self-pay | Admitting: Internal Medicine

## 2013-04-11 ENCOUNTER — Encounter: Payer: Self-pay | Admitting: Internal Medicine

## 2013-04-13 ENCOUNTER — Telehealth: Payer: Self-pay | Admitting: Internal Medicine

## 2013-04-13 MED ORDER — PANTOPRAZOLE SODIUM 40 MG PO TBEC: 40.0000 mg | DELAYED_RELEASE_TABLET | Freq: Every day | ORAL | Status: DC

## 2013-04-13 NOTE — Telephone Encounter (Signed)
Ok to try change to protonix generic, hopefully to be less expensive

## 2013-04-13 NOTE — Telephone Encounter (Signed)
Patient called stating he went to get the omeprazole [facility]) And the price was $132 so he called the insurance company to see why an was told that the Prior authorization needs to be re-newed by calling [phone] Please advise if this can be done, pt would like a call bk at [phone]

## 2013-04-14 NOTE — Telephone Encounter (Signed)
Called the patient informed of change of medication.

## 2013-04-17 ENCOUNTER — Telehealth: Payer: Self-pay | Admitting: Internal Medicine

## 2013-04-17 NOTE — Telephone Encounter (Signed)
04/17/2013   Pt left message in reference to a conversation he had with Robin last week.  Pt states that his previous RX needed authorization and was expensive, so he asked for a new RX, which also needs authorization and costs more than the previous RX.  Pt says the original RX was Omeprazole (?), and he has taken it for 2 years, but I could find no trace of this med in the chart.  Please contact PT in regard to this; [phone].

## 2013-04-18 NOTE — Telephone Encounter (Signed)
Spoke to the patient and the protonix cost more than Omeprazole.  Did receive PA form for the Omeprazole and the patient is requesting PA to be done.  The patient has taken Omeprazole in the past and works well for him.  PA form has been completed and [facility]ed [facility] .

## 2013-04-18 NOTE — Telephone Encounter (Signed)
Already addressed and I have changed to protonix, hopefully that is better afforded

## 2013-04-26 ENCOUNTER — Ambulatory Visit: Payer: BC Managed Care – PPO | Admitting: Internal Medicine

## 2013-04-27 ENCOUNTER — Other Ambulatory Visit: Payer: Self-pay

## 2013-05-07 ENCOUNTER — Other Ambulatory Visit: Payer: Self-pay | Admitting: Internal Medicine

## 2013-05-08 NOTE — Telephone Encounter (Signed)
Ok to refill? Last OV 7.30.14 Last filled 4.24.14

## 2013-05-08 NOTE — Telephone Encounter (Signed)
Done hardcopy to robin  

## 2013-05-09 NOTE — Telephone Encounter (Signed)
Faxed hardcopy to [facility] [address] [place] 

## 2013-06-06 ENCOUNTER — Encounter: Payer: BC Managed Care – PPO | Admitting: Internal Medicine

## 2013-07-17 ENCOUNTER — Telehealth: Payer: Self-pay | Admitting: Internal Medicine

## 2013-07-18 ENCOUNTER — Telehealth: Payer: Self-pay

## 2013-07-18 MED ORDER — LIDOCAINE (ANORECTAL) 5 % EX CREA: 1.0000 "application " | TOPICAL_CREAM | Freq: Three times a day (TID) | CUTANEOUS | Status: DC

## 2013-07-18 NOTE — Telephone Encounter (Signed)
Message copied by Audrea Muscat on Tue Jul 18, 2013  8:31 AM ------      Message from: Irene Shipper      Created: Mon Jul 17, 2013  4:41 PM       Yes that is fine. Thanks      ----- Message -----         From: Audrea Muscat, CMA         Sent: 07/17/2013   4:20 PM           To: Irene Shipper, MD             Patient requesting refill of his 3% Lidocaine cream and wanted a "higher dosage".  Ok to suggest otc 5% Recticare?  Pt last seen in office 10/201, has o/v scheduled for 07-27-2013.        ------

## 2013-07-18 NOTE — Telephone Encounter (Signed)
Per Dr. Henrene Pastor, suggested pt try otc 5% Recticare .  Patient agreed to this and will discuss further with Dr. Henrene Pastor at his 07/27/2013 appointment

## 2013-07-18 NOTE — Telephone Encounter (Signed)
Asked Dr. Henrene Pastor if patient could use 5% Recticare as he requested a higher dosage of lidocaine cream.  Dr. Henrene Pastor agreed, called patient and relayed information in phone note dated 07/18/2013

## 2013-07-27 ENCOUNTER — Ambulatory Visit (INDEPENDENT_AMBULATORY_CARE_PROVIDER_SITE_OTHER): Payer: Medicare Other | Admitting: Internal Medicine

## 2013-07-27 ENCOUNTER — Encounter: Payer: Self-pay | Admitting: Internal Medicine

## 2013-07-27 VITALS — BP 140/80 | HR 80 | Ht 69.5 in | Wt 247.0 lb

## 2013-07-27 DIAGNOSIS — K6289 Other specified diseases of anus and rectum: Secondary | ICD-10-CM

## 2013-07-27 DIAGNOSIS — B369 Superficial mycosis, unspecified: Secondary | ICD-10-CM

## 2013-07-27 DIAGNOSIS — Z86010 Personal history of colonic polyps: Secondary | ICD-10-CM

## 2013-07-27 NOTE — Progress Notes (Signed)
HISTORY OF PRESENT ILLNESS:  Warren Weeks is a 68 y.o. male with multiple medical problems as listed below. He is followed in this office for history of adenomatous colon polyps. He was seen back in October for rectal pain. Etiology unclear. He subsequently underwent complete colonoscopy 04/06/2013. He was found to have a sessile serrated polyp. Followup in 5 years recommended. He was using Anusol suppositories for his rectal pain. Subsequently hydrocortisone cream. Things have improved significantly. He did see a urologist who told her that his pain was not due to his prostate though the patient was complaining of increased urinary frequency at night. He also tells me that after his rectal exam he was "sore all day". Bowel movement sometimes exacerbate his discomfort, but not always. Previous rectal exam was unrevealing. He does have small internal hemorrhoids. He also reports itching in the intertriginous areas his buttock. He may appointment one week ago. Since then, he has had absolutely no pain  REVIEW OF SYSTEMS:  All non-GI ROS negative except for sinus and allergies  Past Medical History  Diagnosis Date  . GLUCOSE INTOLERANCE 09/16/2007  . HYPERCHOLESTEROLEMIA 03/24/2007  . HYPERLIPIDEMIA 09/16/2007  . OBESITY 03/24/2007  . ANXIETY 03/28/2007  . ERECTILE DYSFUNCTION 03/28/2007  . DEPRESSION 03/28/2007  . OTITIS MEDIA, LEFT 06/10/2009  . HYPERTENSION 03/28/2007  . CAROTID ARTERY STENOSIS, BILATERAL 07/01/2009  . PVD 06/25/2009  . HEMORRHOIDS, INTERNAL 03/24/2007  . URI 08/16/2009  . ALLERGIC RHINITIS 09/16/2007  . ESOPHAGITIS 03/24/2007  . ESOPHAGEAL STRICTURE 09/16/2007  . GERD 03/24/2007  . LOW BACK PAIN 03/28/2007  . Dizziness and giddiness 06/08/2009  . RASH-NONVESICULAR 06/17/2009  . CEPHALGIA 06/08/2009  . DYSPNEA/SHORTNESS OF BREATH 09/16/2007  . CHEST PAIN 09/16/2007  . MAGNETIC RESONANCE IMAGING, BRAIN, ABNORMAL 06/26/2009  . ALLERGY 03/24/2007  . COLONIC POLYPS, HX OF 09/16/2007  . NECK  PAIN, LEFT 06/20/2010  . Palpitations 06/20/2010  . Impaired glucose tolerance 10/04/2010  . 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 Tonsillectomy    . S/p right knee arthroscopy      Social History Warren Weeks  reports that he has quit smoking. He has never used smokeless tobacco. He reports that he drinks about 4.2 ounces of alcohol per week. He reports that he does not use illicit drugs.  family history includes Coronary artery disease in his brother and sister; Heart disease in his father; Lung cancer in his sister; Lymphoma in his mother; Ovarian cancer in his sister. There is no history of Colon cancer, Esophageal cancer, Rectal cancer, or Stomach cancer.  Allergies  Allergen Reactions  . Cefuroxime Axetil     Rash        PHYSICAL EXAMINATION: Vital signs: BP 140/80  Pulse 80  Ht 5' 9.5" (1.765 m)  Wt 247 lb (112.038 kg)  BMI 35.96 kg/m2 General: Well-developed, well-nourished, no acute distress Abdomen: Not reexamined. Psychiatric: alert and oriented x3. Cooperative   ASSESSMENT:  #1. Problems with vague rectal pain. Etiology unclear. Improved. #2. Likely cutaneous fungal infection of the intertriginous areas of the buttock #3. History of adenomatous colon polyps. Last colonoscopy October 2014  PLAN:  #1. Expectant management #2. Lamisil cream twice daily to intertriginous area of buttock #3. Surveillance colonoscopy around October 2019. Interval followup as needed

## 2013-07-27 NOTE — Patient Instructions (Signed)
Please follow up with Dr. Perry as needed 

## 2013-08-04 ENCOUNTER — Other Ambulatory Visit: Payer: Self-pay | Admitting: Internal Medicine

## 2013-08-09 ENCOUNTER — Other Ambulatory Visit: Payer: Self-pay

## 2013-08-09 MED ORDER — OMEPRAZOLE 20 MG PO CPDR: 20.0000 mg | DELAYED_RELEASE_CAPSULE | Freq: Two times a day (BID) | ORAL | Status: DC

## 2013-10-18 ENCOUNTER — Other Ambulatory Visit: Payer: Self-pay | Admitting: Internal Medicine

## 2013-11-15 ENCOUNTER — Other Ambulatory Visit: Payer: Self-pay | Admitting: Internal Medicine

## 2013-11-15 NOTE — Telephone Encounter (Signed)
Done hardcopy to robin  

## 2013-11-16 NOTE — Telephone Encounter (Signed)
Faxed hardcopy to [facility] [address] [place]  

## 2013-12-08 ENCOUNTER — Other Ambulatory Visit: Payer: Medicare Other

## 2013-12-08 ENCOUNTER — Telehealth: Payer: Self-pay

## 2013-12-08 DIAGNOSIS — Z0000 Encounter for general adult medical examination without abnormal findings: Secondary | ICD-10-CM

## 2013-12-08 DIAGNOSIS — E291 Testicular hypofunction: Secondary | ICD-10-CM

## 2013-12-08 NOTE — Telephone Encounter (Signed)
Labs entered.

## 2013-12-12 ENCOUNTER — Other Ambulatory Visit (INDEPENDENT_AMBULATORY_CARE_PROVIDER_SITE_OTHER): Payer: Medicare Other

## 2013-12-12 ENCOUNTER — Ambulatory Visit (INDEPENDENT_AMBULATORY_CARE_PROVIDER_SITE_OTHER): Payer: Medicare Other | Admitting: Internal Medicine

## 2013-12-12 ENCOUNTER — Encounter: Payer: Self-pay | Admitting: Internal Medicine

## 2013-12-12 VITALS — BP 110/74 | HR 94 | Temp 98.1°F | Ht 69.5 in | Wt 240.5 lb

## 2013-12-12 DIAGNOSIS — R7302 Impaired glucose tolerance (oral): Secondary | ICD-10-CM

## 2013-12-12 DIAGNOSIS — Z0000 Encounter for general adult medical examination without abnormal findings: Secondary | ICD-10-CM

## 2013-12-12 DIAGNOSIS — Z23 Encounter for immunization: Secondary | ICD-10-CM

## 2013-12-12 DIAGNOSIS — R7309 Other abnormal glucose: Secondary | ICD-10-CM

## 2013-12-12 DIAGNOSIS — E7800 Pure hypercholesterolemia, unspecified: Secondary | ICD-10-CM

## 2013-12-12 DIAGNOSIS — E291 Testicular hypofunction: Secondary | ICD-10-CM

## 2013-12-12 LAB — LIPID PANEL
CHOL/HDL RATIO: 3
Cholesterol: 176 mg/dL (ref 0–200)
HDL: 61.8 mg/dL (ref >39.00)
LDL CALC: 82 mg/dL (ref 0–99)
NonHDL: 114.2
Triglycerides: 161 mg/dL — ABNORMAL HIGH (ref 0.0–149.0)
VLDL: 32.2 mg/dL (ref 0.0–40.0)

## 2013-12-12 LAB — HEPATIC FUNCTION PANEL
ALBUMIN: 4.3 g/dL (ref 3.5–5.2)
ALK PHOS: 71 U/L (ref 39–117)
ALT: 69 U/L — ABNORMAL HIGH (ref 0–53)
AST: 47 U/L — ABNORMAL HIGH (ref 0–37)
Bilirubin, Direct: 0.1 mg/dL (ref 0.0–0.3)
TOTAL PROTEIN: 7.1 g/dL (ref 6.0–8.3)
Total Bilirubin: 0.5 mg/dL (ref 0.2–1.2)

## 2013-12-12 LAB — URINALYSIS, ROUTINE W REFLEX MICROSCOPIC
Bilirubin Urine: NEGATIVE
HGB URINE DIPSTICK: NEGATIVE
Ketones, ur: NEGATIVE
Leukocytes, UA: NEGATIVE
Nitrite: NEGATIVE
RBC / HPF: NONE SEEN (ref 0–?)
Specific Gravity, Urine: 1.025 (ref 1.000–1.030)
Total Protein, Urine: NEGATIVE
UROBILINOGEN UA: 0.2 (ref 0.0–1.0)
Urine Glucose: NEGATIVE
WBC UA: NONE SEEN (ref 0–?)
pH: 5.5 (ref 5.0–8.0)

## 2013-12-12 LAB — TESTOSTERONE: Testosterone: 98.67 ng/dL — ABNORMAL LOW (ref 300.00–890.00)

## 2013-12-12 LAB — BASIC METABOLIC PANEL
BUN: 23 mg/dL (ref 6–23)
CHLORIDE: 104 meq/L (ref 96–112)
CO2: 28 meq/L (ref 19–32)
Calcium: 10 mg/dL (ref 8.4–10.5)
Creatinine, Ser: 1.1 mg/dL (ref 0.4–1.5)
GFR: 89.05 mL/min (ref >60.00)
Glucose, Bld: 119 mg/dL — ABNORMAL HIGH (ref 70–99)
POTASSIUM: 4.5 meq/L (ref 3.5–5.1)
Sodium: 140 mEq/L (ref 135–145)

## 2013-12-12 LAB — CBC WITH DIFFERENTIAL/PLATELET
BASOS ABS: 0 10*3/uL (ref 0.0–0.1)
Basophils Relative: 0.4 % (ref 0.0–3.0)
Eosinophils Absolute: 0 10*3/uL (ref 0.0–0.7)
Eosinophils Relative: 0.7 % (ref 0.0–5.0)
HCT: 40.7 % (ref 39.0–52.0)
HEMOGLOBIN: 13.6 g/dL (ref 13.0–17.0)
LYMPHS PCT: 22.7 % (ref 12.0–46.0)
Lymphs Abs: 1.4 10*3/uL (ref 0.7–4.0)
MCHC: 33.3 g/dL (ref 30.0–36.0)
MCV: 90.2 fl (ref 78.0–100.0)
Monocytes Absolute: 0.5 10*3/uL (ref 0.1–1.0)
Monocytes Relative: 8.6 % (ref 3.0–12.0)
NEUTROS PCT: 67.6 % (ref 43.0–77.0)
Neutro Abs: 4.1 10*3/uL (ref 1.4–7.7)
Platelets: 189 10*3/uL (ref 150.0–400.0)
RBC: 4.51 Mil/uL (ref 4.22–5.81)
RDW: 14.4 % (ref 11.5–15.5)
WBC: 6.1 10*3/uL (ref 4.0–10.5)

## 2013-12-12 LAB — TSH: TSH: 1.71 u[IU]/mL (ref 0.35–4.50)

## 2013-12-12 LAB — HEMOGLOBIN A1C: HEMOGLOBIN A1C: 6.7 % — AB (ref 4.6–6.5)

## 2013-12-12 LAB — PSA: PSA: 0.16 ng/mL (ref 0.10–4.00)

## 2013-12-12 MED ORDER — HYDROCHLOROTHIAZIDE 25 MG PO TABS: 25.0000 mg | ORAL_TABLET | Freq: Every day | ORAL | Status: DC

## 2013-12-12 MED ORDER — METOPROLOL TARTRATE 25 MG PO TABS: 25.0000 mg | ORAL_TABLET | Freq: Two times a day (BID) | ORAL | Status: DC

## 2013-12-12 MED ORDER — FLUTICASONE PROPIONATE 50 MCG/ACT NA SUSP: 2.0000 | Freq: Every day | NASAL | Status: DC

## 2013-12-12 NOTE — Progress Notes (Signed)
Subjective:    Patient ID: Warren Weeks, male    DOB: Aug 17, 1945, 68 y.o.   MRN: [id number]  HPI  Here for wellness and f/u;  Overall doing ok;  Pt denies CP, worsening SOB, DOE, wheezing, orthopnea, PND, worsening LE edema, palpitations, dizziness or syncope.  Pt denies neurological change such as new headache, facial or extremity weakness.  Pt denies polydipsia, polyuria, or low sugar symptoms. Pt states overall good compliance with treatment and medications, good tolerability, and has been trying to follow lower cholesterol diet.  Pt denies worsening depressive symptoms, suicidal ideation or panic. No fever, night sweats, wt loss, loss of appetite, or other constitutional symptoms.  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 GLUCOSE INTOLERANCE 09/16/2007  . HYPERCHOLESTEROLEMIA 03/24/2007  . HYPERLIPIDEMIA 09/16/2007  . OBESITY 03/24/2007  . ANXIETY 03/28/2007  . ERECTILE DYSFUNCTION 03/28/2007  . DEPRESSION 03/28/2007  . OTITIS MEDIA, LEFT 06/10/2009  . HYPERTENSION 03/28/2007  . CAROTID ARTERY STENOSIS, BILATERAL 07/01/2009  . PVD 06/25/2009  . HEMORRHOIDS, INTERNAL 03/24/2007  . URI 08/16/2009  . ALLERGIC RHINITIS 09/16/2007  . ESOPHAGITIS 03/24/2007  . ESOPHAGEAL STRICTURE 09/16/2007  . GERD 03/24/2007  . LOW BACK PAIN 03/28/2007  . Dizziness and giddiness 06/08/2009  . RASH-NONVESICULAR 06/17/2009  . CEPHALGIA 06/08/2009  . DYSPNEA/SHORTNESS OF BREATH 09/16/2007  . CHEST PAIN 09/16/2007  . MAGNETIC RESONANCE IMAGING, BRAIN, ABNORMAL 06/26/2009  . ALLERGY 03/24/2007  . COLONIC POLYPS, HX OF 09/16/2007  . NECK PAIN, LEFT 06/20/2010  . Palpitations 06/20/2010  . Impaired glucose tolerance 10/04/2010  . Hypogonadism  male 10/09/2011   Past Surgical History  Procedure Laterality Date  . Tonsillectomy    . S/p right knee arthroscopy      reports that he has quit smoking. He has never used smokeless tobacco. He reports that he drinks about 4.2 ounces of alcohol per week. He reports that he does not use illicit drugs. family history includes Coronary artery disease in his brother and sister; Heart disease in his father; Lung cancer in his sister; Lymphoma in his mother; Ovarian cancer in his sister. There is no history of Colon cancer, Esophageal cancer, Rectal cancer, or Stomach cancer. Allergies  Allergen Reactions  . Cefuroxime Axetil     Rash    Current Outpatient Prescriptions on File Prior to Visit  Medication Sig Dispense Refill  . ANDROGEL PUMP 20.25 MG/ACT (1.62%) GEL APPLY 2 PUMPS ONTO SKIN DAILY  75 g  0  . aspirin (ECOTRIN LOW STRENGTH) 81 MG EC tablet Take 81 mg by mouth daily.        . CRESTOR 40 MG tablet TAKE ONE TABLET BY MOUTH EVERY DAY  90 tablet  2  . cyclobenzaprine (FLEXERIL) 5 MG tablet Take 1 tablet (5 mg total) by mouth 3 (three) times daily as needed.  90 tablet  3  . diclofenac (VOLTAREN) 75 MG EC tablet Take 75 mg by mouth as needed.       . lidocaine (LINDAMANTLE) 3 % CREA cream Apply internally to the rectum 3 times daily.  30 g  1  . Lidocaine, Anorectal, (RECTICARE) 5 % CREA Apply 1 application topically 3 (three) times daily.  1 Tube  0  .  lisinopril (PRINIVIL,ZESTRIL) 10 MG tablet Take 1 tablet (10 mg total) by mouth daily.  90 tablet  3  . lisinopril (PRINIVIL,ZESTRIL) 20 MG tablet TAKE ONE TABLET BY MOUTH ONCE DAILY  90 tablet  0  . omeprazole (PRILOSEC) 20 MG capsule Take 1 capsule (20 mg total) by mouth 2 (two) times daily.  180 capsule  1  . pantoprazole (PROTONIX) 40 MG tablet Take 1 tablet (40 mg total) by mouth daily.  90 tablet  3  . sertraline (ZOLOFT) 50 MG tablet TAKE ONE TABLET BY MOUTH EVERY DAY  90 tablet  2  . sildenafil (VIAGRA) 100 MG tablet Take 1  tablet (100 mg total) by mouth as needed for erectile dysfunction.  10 tablet  11  . vitamin E 600 UNIT capsule Take 600 Units by mouth daily.        Marland Kitchen ZETIA 10 MG tablet TAKE ONE TABLET BY MOUTH EVERY DAY  90 tablet  3   No current facility-administered medications on file prior to visit.   Review of Systems Constitutional: Negative for increased diaphoresis, other activity, appetite or other siginficant weight change  HENT: Negative for worsening hearing loss, ear pain, facial swelling, mouth sores and neck stiffness.   Eyes: Negative for other worsening pain, redness or visual disturbance.  Respiratory: Negative for shortness of breath and wheezing.   Cardiovascular: Negative for chest pain and palpitations.  Gastrointestinal: Negative for diarrhea, blood in stool, abdominal distention or other pain Genitourinary: Negative for hematuria, flank pain or change in urine volume.  Musculoskeletal: Negative for myalgias or other joint complaints.  Skin: Negative for color change and wound.  Neurological: Negative for syncope and numbness. other than noted Hematological: Negative for adenopathy. or other swelling Psychiatric/Behavioral: Negative for hallucinations, self-injury, decreased concentration or other worsening agitation.      Objective:   Physical Exam BP 110/74  Pulse 94  Temp(Src) 98.1 F (36.7 C) (Oral)  Ht 5' 9.5" (1.765 m)  Wt 240 lb 8 oz (109.09 kg)  BMI 35.02 kg/m2  SpO2 97% VS noted,  Constitutional: Pt is oriented to person, place, and time. Appears well-developed and well-nourished. Annabell Sabal Head: Normocephalic and atraumatic.  Right Ear: External ear normal.  Left Ear: External ear normal.  Nose: Nose normal.  Mouth/Throat: Oropharynx is clear and moist.  Eyes: Conjunctivae and EOM are normal. Pupils are equal, round, and reactive to light.  Neck: Normal range of motion. Neck supple. No JVD present. No tracheal deviation present.  Cardiovascular: Normal rate,  regular rhythm, normal heart sounds and intact distal pulses.   Pulmonary/Chest: Effort normal and breath sounds without rales or wheezing  Abdominal: Soft. Bowel sounds are normal. NT. No HSM  Musculoskeletal: Normal range of motion. Exhibits no edema.  Lymphadenopathy:  Has no cervical adenopathy.  Neurological: Pt is alert and oriented to person, place, and time. Pt has normal reflexes. No cranial nerve deficit. Motor grossly intact Skin: Skin is warm and dry. No rash noted.  Psychiatric:  Has normal mood and affect. Behavior is normal.     Assessment & Plan:

## 2013-12-12 NOTE — Assessment & Plan Note (Signed)

## 2013-12-12 NOTE — Patient Instructions (Signed)
You had the new Prevnar pneumonia shot today  Please continue all other medications as before, and refills have been done if requested.  Please have the pharmacy call with any other refills you may need.  Please continue your efforts at being more active, low cholesterol diet, and weight control.  You are otherwise up to date with prevention measures today.  Please keep your appointments with your specialists as you may have planned  Please go to the LAB in the Basement (turn left off the elevator) for the tests to be done today  You will be contacted by phone if any changes need to be made immediately.  Otherwise, you will receive a letter about your results with an explanation, but please check with MyChart first.  Please remember to sign up for MyChart if you have not done so, as this will be important to you in the future with finding out test results, communicating by private email, and scheduling acute appointments online when needed.  Please return in 1 year for your yearly visit, or sooner if needed, with Lab testing done 3-5 days before  

## 2013-12-12 NOTE — Progress Notes (Signed)
Pre visit review using our clinic review tool, if applicable. No additional management support is needed unless otherwise documented below in the visit note. 

## 2013-12-12 NOTE — Assessment & Plan Note (Signed)
Asymtp, for a1c today 

## 2013-12-12 NOTE — Addendum Note (Signed)
Addended by: Sharon Seller B on: 12/12/2013 03:33 PM   Modules accepted: Orders

## 2013-12-21 ENCOUNTER — Encounter: Payer: Medicare Other | Admitting: Internal Medicine

## 2013-12-24 ENCOUNTER — Other Ambulatory Visit: Payer: Self-pay | Admitting: Internal Medicine

## 2014-01-26 ENCOUNTER — Ambulatory Visit: Payer: Medicare Other | Attending: Orthopaedic Surgery | Admitting: Physical Therapy

## 2014-01-26 DIAGNOSIS — IMO0001 Reserved for inherently not codable concepts without codable children: Secondary | ICD-10-CM | POA: Insufficient documentation

## 2014-01-26 DIAGNOSIS — M25569 Pain in unspecified knee: Secondary | ICD-10-CM | POA: Diagnosis not present

## 2014-01-30 ENCOUNTER — Ambulatory Visit: Payer: Medicare Other | Admitting: Physical Therapy

## 2014-01-30 DIAGNOSIS — IMO0001 Reserved for inherently not codable concepts without codable children: Secondary | ICD-10-CM | POA: Diagnosis not present

## 2014-01-31 ENCOUNTER — Ambulatory Visit: Payer: Medicare Other | Admitting: Physical Therapy

## 2014-01-31 DIAGNOSIS — IMO0001 Reserved for inherently not codable concepts without codable children: Secondary | ICD-10-CM | POA: Diagnosis not present

## 2014-02-05 ENCOUNTER — Ambulatory Visit: Payer: Medicare Other | Admitting: Physical Therapy

## 2014-02-05 DIAGNOSIS — IMO0001 Reserved for inherently not codable concepts without codable children: Secondary | ICD-10-CM | POA: Diagnosis not present

## 2014-02-07 ENCOUNTER — Ambulatory Visit: Payer: Medicare Other | Admitting: Rehabilitation

## 2014-02-07 DIAGNOSIS — IMO0001 Reserved for inherently not codable concepts without codable children: Secondary | ICD-10-CM | POA: Diagnosis not present

## 2014-02-13 ENCOUNTER — Ambulatory Visit: Payer: Medicare Other | Admitting: Physical Therapy

## 2014-02-13 ENCOUNTER — Encounter: Payer: Medicare Other | Admitting: Physical Therapy

## 2014-02-13 DIAGNOSIS — IMO0001 Reserved for inherently not codable concepts without codable children: Secondary | ICD-10-CM | POA: Diagnosis not present

## 2014-02-15 ENCOUNTER — Ambulatory Visit: Payer: Medicare Other | Admitting: Physical Therapy

## 2014-02-19 ENCOUNTER — Encounter: Payer: Medicare Other | Admitting: Rehabilitation

## 2014-02-21 ENCOUNTER — Encounter: Payer: Medicare Other | Admitting: Rehabilitation

## 2014-05-30 ENCOUNTER — Other Ambulatory Visit: Payer: Self-pay | Admitting: Internal Medicine

## 2014-06-05 ENCOUNTER — Other Ambulatory Visit: Payer: Self-pay

## 2014-06-05 MED ORDER — LISINOPRIL 10 MG PO TABS: 10.0000 mg | ORAL_TABLET | Freq: Every day | ORAL | Status: DC

## 2014-06-05 NOTE — Telephone Encounter (Signed)
Curry Ishibashi (12/07/1945)  Unable to reach to patient due to eligibility  Lisinopril 20 mg o Last filled 10-18-13 #90  ACTION ITEMS: o Is patient to still be taking? May need new Rx sent to pharmacy. Suggest 90 day Rx if appropriate. o If so, reach out to patient to empower him to refill and pick up his medication.

## 2014-06-05 NOTE — Telephone Encounter (Signed)
Called patient to discuss medication adherence with lisinopril. Per pt, he takes 10 mg but had not refilled due to still having left 20 mg (cutting in half). New Rx for for 90 days sent to local pharmacy and pick will pick up.

## 2014-06-13 ENCOUNTER — Encounter: Payer: Self-pay | Admitting: Internal Medicine

## 2014-06-13 ENCOUNTER — Ambulatory Visit (INDEPENDENT_AMBULATORY_CARE_PROVIDER_SITE_OTHER): Payer: Medicare Other | Admitting: Internal Medicine

## 2014-06-13 VITALS — BP 126/86 | HR 95 | Temp 98.0°F | Ht 69.5 in | Wt 253.0 lb

## 2014-06-13 DIAGNOSIS — J309 Allergic rhinitis, unspecified: Secondary | ICD-10-CM

## 2014-06-13 DIAGNOSIS — I10 Essential (primary) hypertension: Secondary | ICD-10-CM

## 2014-06-13 DIAGNOSIS — E7800 Pure hypercholesterolemia, unspecified: Secondary | ICD-10-CM

## 2014-06-13 DIAGNOSIS — R7302 Impaired glucose tolerance (oral): Secondary | ICD-10-CM

## 2014-06-13 DIAGNOSIS — Z0000 Encounter for general adult medical examination without abnormal findings: Secondary | ICD-10-CM

## 2014-06-13 DIAGNOSIS — Z0189 Encounter for other specified special examinations: Secondary | ICD-10-CM

## 2014-06-13 DIAGNOSIS — J019 Acute sinusitis, unspecified: Secondary | ICD-10-CM | POA: Insufficient documentation

## 2014-06-13 MED ORDER — LEVOFLOXACIN 250 MG PO TABS: 250.0000 mg | ORAL_TABLET | Freq: Every day | ORAL | Status: DC

## 2014-06-13 MED ORDER — METHYLPREDNISOLONE ACETATE 80 MG/ML IJ SUSP
80.0000 mg | Freq: Once | INTRAMUSCULAR | Status: AC
  Administered 2014-06-13: 80 mg via INTRAMUSCULAR

## 2014-06-13 NOTE — Progress Notes (Signed)
Pre visit review using our clinic review tool, if applicable. No additional management support is needed unless otherwise documented below in the visit note. 

## 2014-06-13 NOTE — Progress Notes (Signed)
Subjective:    Patient ID: Warren Weeks, male    DOB: October 16, 1945, 68 y.o.   MRN: [id number]  HPI   Here with 2-3 days acute onset fever, facial pain, pressure, headache, general weakness and malaise, and greenish d/c, with mild ST and cough, but pt denies chest pain, wheezing, increased sob or doe, orthopnea, PND, increased LE swelling, palpitations, dizziness or syncope. Also long hx of allergies, taken allergy shots in past, worse this past fall, Does have several wks ongoing nasal allergy symptoms with clearish congestion, itch and sneezing, without fever, pain, ST, cough, swelling or wheezing, despite flonase and allegra prn. Pt denies chest pain, increased sob or doe, wheezing, orthopnea, PND, increased LE swelling, palpitations, dizziness or syncope.  Pt denies new neurological symptoms such as new headache, or facial or extremity weakness or numbness   Past Medical History  Diagnosis Date  . GLUCOSE INTOLERANCE 09/16/2007  . HYPERCHOLESTEROLEMIA 03/24/2007  . HYPERLIPIDEMIA 09/16/2007  . OBESITY 03/24/2007  . ANXIETY 03/28/2007  . ERECTILE DYSFUNCTION 03/28/2007  . DEPRESSION 03/28/2007  . OTITIS MEDIA, LEFT 06/10/2009  . HYPERTENSION 03/28/2007  . CAROTID ARTERY STENOSIS, BILATERAL 07/01/2009  . PVD 06/25/2009  . HEMORRHOIDS, INTERNAL 03/24/2007  . URI 08/16/2009  . ALLERGIC RHINITIS 09/16/2007  . ESOPHAGITIS 03/24/2007  . ESOPHAGEAL STRICTURE 09/16/2007  . GERD 03/24/2007  . LOW BACK PAIN 03/28/2007  . Dizziness and giddiness 06/08/2009  . RASH-NONVESICULAR 06/17/2009  . CEPHALGIA 06/08/2009  . DYSPNEA/SHORTNESS OF BREATH 09/16/2007  . CHEST PAIN 09/16/2007  . MAGNETIC RESONANCE IMAGING, BRAIN, ABNORMAL 06/26/2009  . ALLERGY 03/24/2007  . COLONIC POLYPS, HX OF 09/16/2007  . NECK PAIN, LEFT 06/20/2010  . Palpitations 06/20/2010  . Impaired glucose tolerance 10/04/2010  . 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 Tonsillectomy    . S/p right knee  arthroscopy      reports that he has quit smoking. He has never used smokeless tobacco. He reports that he drinks about 4.2 oz of alcohol per week. He reports that he does not use illicit drugs. family history includes Coronary artery disease in his brother and sister; Heart disease in his father; Lung cancer in his sister; Lymphoma in his mother; Ovarian cancer in his sister. There is no history of Colon cancer, Esophageal cancer, Rectal cancer, or Stomach cancer. Allergies  Allergen Reactions  . Cefuroxime Axetil     Rash    Current Outpatient Prescriptions on File Prior to Visit  Medication Sig Dispense Refill  . ANDROGEL PUMP 20.25 MG/ACT (1.62%) GEL APPLY 2 PUMPS ONTO SKIN DAILY 75 g 0  . aspirin (ECOTRIN LOW STRENGTH) 81 MG EC tablet Take 81 mg by mouth daily.      . CRESTOR 40 MG tablet TAKE ONE TABLET BY MOUTH ONCE DAILY 90 tablet 3  . cyclobenzaprine (FLEXERIL) 5 MG tablet Take 1 tablet (5 mg total) by mouth 3 (three) times daily as needed. 90 tablet 3  . diclofenac (VOLTAREN) 75 MG EC tablet Take 75 mg by mouth as needed.     . hydrochlorothiazide (HYDRODIURIL) 25 MG tablet Take 1 tablet (25 mg total) by mouth daily. 30 tablet 11  . lisinopril (PRINIVIL,ZESTRIL) 10 MG tablet Take 1 tablet (10 mg total) by mouth daily. Please d/c any previous scripts for same medication. 90 tablet 4  . metoprolol tartrate (LOPRESSOR) 25 MG tablet Take 1 tablet (25 mg total) by mouth 2 (two) times daily. 60 tablet 11  . omeprazole (  PRILOSEC) 20 MG capsule TAKE ONE CAPSULE BY MOUTH TWICE DAILY 180 capsule 0  . sertraline (ZOLOFT) 50 MG tablet TAKE ONE TABLET BY MOUTH EVERY DAY 90 tablet 2  . sildenafil (VIAGRA) 100 MG tablet Take 1 tablet (100 mg total) by mouth as needed for erectile dysfunction. 10 tablet 11  . vitamin E 600 UNIT capsule Take 600 Units by mouth daily.       No current facility-administered medications on file prior to visit.   Review of Systems VS noted,  Constitutional: Pt  appears well-developed, well-nourished.  HENT: Head: NCAT.  Right Ear: External ear normal.  Left Ear: External ear normal.  Eyes: . Pupils are equal, round, and reactive to light. Conjunctivae and EOM are normal Neck: Normal range of motion. Neck supple.  Cardiovascular: Normal rate and regular rhythm.   Pulmonary/Chest: Effort normal and breath sounds without rales or wheezing.  Abd:  Soft, NT, ND, + BS Neurological: Pt is alert. Not confused , motor grossly intact Skin: Skin is warm. No rash Psychiatric: Pt behavior is normal. No agitation.      Objective:   Physical Exam BP 126/86 mmHg  Pulse 95  Temp(Src) 98 F (36.7 C) (Oral)  Ht 5' 9.5" (1.765 m)  Wt 253 lb (114.76 kg)  BMI 36.84 kg/m2  SpO2 95% VS noted, mild ill Constitutional: Pt appears well-developed, well-nourished.  HENT: Head: NCAT.  Right Ear: External ear normal.  Left Ear: External ear normal.  Eyes: . Pupils are equal, round, and reactive to light. Conjunctivae and EOM are normal Bilat tm's with mild erythema.  Max sinus areas mild tender.  Pharynx with mild erythema, no exudate Neck: Normal range of motion. Neck supple.  Cardiovascular: Normal rate and regular rhythm.   Pulmonary/Chest: Effort normal and breath sounds without rales or wheezing.  Neurological: Pt is alert. Not confused , motor grossly intact Skin: Skin is warm. No rash Psychiatric: Pt behavior is normal. No agitation.     Assessment & Plan:

## 2014-06-13 NOTE — Patient Instructions (Signed)
Please take all new medication as prescribed - the antibiotic  You had the steroid shot today  Please continue all other medications as before, and refills have been done if requested.  Please call if you would need to be referred to an Allergist  Please have the pharmacy call with any other refills you may need.  Please continue your efforts at being more active, low cholesterol diet, and weight control.  Please keep your appointments with your specialists as you may have planned  Please consider seeing Dr Tamala Julian in our office if your knee worsens  Please go to the LAB in the Basement (turn left off the elevator) for the tests to be done today  You will be contacted by phone if any changes need to be made immediately.  Otherwise, you will receive a letter about your results with an explanation, but please check with MyChart first.  Please return in 6 months, or sooner if needed, with Lab testing done 3-5 days before

## 2014-06-14 ENCOUNTER — Other Ambulatory Visit (INDEPENDENT_AMBULATORY_CARE_PROVIDER_SITE_OTHER): Payer: Medicare Other

## 2014-06-14 ENCOUNTER — Telehealth: Payer: Self-pay | Admitting: Internal Medicine

## 2014-06-14 DIAGNOSIS — R7302 Impaired glucose tolerance (oral): Secondary | ICD-10-CM

## 2014-06-14 DIAGNOSIS — I10 Essential (primary) hypertension: Secondary | ICD-10-CM

## 2014-06-14 LAB — BASIC METABOLIC PANEL
BUN: 15 mg/dL (ref 6–23)
CALCIUM: 9 mg/dL (ref 8.4–10.5)
CO2: 26 meq/L (ref 19–32)
CREATININE: 1 mg/dL (ref 0.4–1.5)
Chloride: 104 mEq/L (ref 96–112)
GFR: 91.88 mL/min (ref >60.00)
Glucose, Bld: 115 mg/dL — ABNORMAL HIGH (ref 70–99)
Potassium: 4.4 mEq/L (ref 3.5–5.1)
Sodium: 138 mEq/L (ref 135–145)

## 2014-06-14 LAB — LIPID PANEL
Cholesterol: 143 mg/dL (ref 0–200)
HDL: 54.2 mg/dL (ref >39.00)
LDL Cholesterol: 76 mg/dL (ref 0–99)
NONHDL: 88.8
Total CHOL/HDL Ratio: 3
Triglycerides: 64 mg/dL (ref 0.0–149.0)
VLDL: 12.8 mg/dL (ref 0.0–40.0)

## 2014-06-14 LAB — HEPATIC FUNCTION PANEL
ALK PHOS: 60 U/L (ref 39–117)
ALT: 35 U/L (ref 0–53)
AST: 30 U/L (ref 0–37)
Albumin: 3.9 g/dL (ref 3.5–5.2)
BILIRUBIN DIRECT: 0.1 mg/dL (ref 0.0–0.3)
TOTAL PROTEIN: 7 g/dL (ref 6.0–8.3)
Total Bilirubin: 0.6 mg/dL (ref 0.2–1.2)

## 2014-06-14 LAB — HEMOGLOBIN A1C: HEMOGLOBIN A1C: 6.5 % (ref 4.6–6.5)

## 2014-06-14 MED ORDER — FLUTICASONE PROPIONATE 50 MCG/ACT NA SUSP: 2.0000 | Freq: Every day | NASAL | Status: DC

## 2014-06-14 MED ORDER — HYDROCODONE-HOMATROPINE 5-1.5 MG/5ML PO SYRP: 5.0000 mL | ORAL_SOLUTION | Freq: Four times a day (QID) | ORAL | Status: DC | PRN

## 2014-06-14 NOTE — Telephone Encounter (Signed)
flonase done erx, also cough med done hardcopy

## 2014-06-14 NOTE — Telephone Encounter (Signed)
Pt came by office stating that he needed a cough medicine. Pt was seen yesterday and is also requesting some medication for his nose. He states he has been having trouble sleeping because of his cough and sinuses. Pt states the antibiotic he received is not working. Please contact pt when request is reviewed.

## 2014-06-14 NOTE — Telephone Encounter (Signed)
emmi emailed °

## 2014-06-14 NOTE — Telephone Encounter (Signed)
Called the patient informed prescription sent in and hardcopy of cough med. Can pickup at the front desk

## 2014-06-17 NOTE — Assessment & Plan Note (Signed)
Also for depomederol IM today,  to f/u any worsening symptoms or concerns

## 2014-06-17 NOTE — Assessment & Plan Note (Signed)
stable overall by history and exam, recent data reviewed with pt, and pt to continue medical treatment as before,  to f/u any worsening symptoms or concerns BP Readings from Last 3 Encounters:  06/13/14 126/86  12/12/13 110/74  07/27/13 140/80

## 2014-06-17 NOTE — Assessment & Plan Note (Signed)
stable overall by history and exam, recent data reviewed with pt, and pt to continue medical treatment as before,  to f/u any worsening symptoms or concerns Lab Results  Component Value Date   HGBA1C 6.5 06/14/2014

## 2014-06-17 NOTE — Assessment & Plan Note (Signed)
stable overall by history and exam, recent data reviewed with pt, and pt to continue medical treatment as before,  to f/u any worsening symptoms or concerns Lab Results  Component Value Date   LDLCALC 76 06/14/2014

## 2014-06-17 NOTE — Assessment & Plan Note (Signed)
Mild to mod, for antibx course,  to f/u any worsening symptoms or concerns 

## 2014-08-01 ENCOUNTER — Telehealth: Payer: Self-pay | Admitting: Internal Medicine

## 2014-08-01 NOTE — Telephone Encounter (Signed)
Please advise 

## 2014-08-01 NOTE — Telephone Encounter (Signed)
States Dr. Jenny Reichmann has seen him for cloudy mucus and runny nose.  He states he still has this and is requesting an antibiotic to be sent to [facility] on [address].

## 2014-08-01 NOTE — Telephone Encounter (Signed)
Please consider OV as last visit was dec 23, and this is not likely related, and office policy is to not prescribe antibiotic by phone to help reduce the chance of antibiotic resistance

## 2014-08-02 NOTE — Telephone Encounter (Signed)
Pt aware, appt scheduled for 08/03/14 at 11

## 2014-08-03 ENCOUNTER — Encounter: Payer: Self-pay | Admitting: Internal Medicine

## 2014-08-03 ENCOUNTER — Ambulatory Visit (INDEPENDENT_AMBULATORY_CARE_PROVIDER_SITE_OTHER): Payer: Medicare Other | Admitting: Internal Medicine

## 2014-08-03 VITALS — BP 126/72 | HR 77 | Temp 98.1°F | Ht 69.5 in | Wt 246.0 lb

## 2014-08-03 DIAGNOSIS — I10 Essential (primary) hypertension: Secondary | ICD-10-CM

## 2014-08-03 DIAGNOSIS — J019 Acute sinusitis, unspecified: Secondary | ICD-10-CM

## 2014-08-03 DIAGNOSIS — R7302 Impaired glucose tolerance (oral): Secondary | ICD-10-CM

## 2014-08-03 DIAGNOSIS — J309 Allergic rhinitis, unspecified: Secondary | ICD-10-CM

## 2014-08-03 MED ORDER — CLARITHROMYCIN 250 MG PO TABS: 250.0000 mg | ORAL_TABLET | Freq: Two times a day (BID) | ORAL | Status: DC

## 2014-08-03 NOTE — Progress Notes (Signed)
Subjective:    Patient ID: Warren Weeks, male    DOB: 14-Dec-1945, 69 y.o.   MRN: [id number]  HPI   Here with 2-3 wks acute onset fever, facial pain, pressure, headache, general weakness and malaise, and greenish d/c, with mild ST and cough, but pt denies chest pain, wheezing, increased sob or doe, orthopnea, PND, increased LE swelling, palpitations, dizziness or syncope.   Pt denies chest pain, increased sob or doe, wheezing, orthopnea, PND, increased LE swelling, palpitations, dizziness or syncope.  Pt denies polydipsia, polyuria, or low sugar symptoms such as weakness or confusion improved with po intake.  Pt denies new neurological symptoms such as new headache, or facial or extremity weakness or numbness.   Pt states overall good compliance with meds, has been trying to follow lower cholesterol, diabetic diet, with wt overall stable,  but little exercise however. Past Medical History  Diagnosis Date  . GLUCOSE INTOLERANCE 09/16/2007  . HYPERCHOLESTEROLEMIA 03/24/2007  . HYPERLIPIDEMIA 09/16/2007  . OBESITY 03/24/2007  . ANXIETY 03/28/2007  . ERECTILE DYSFUNCTION 03/28/2007  . DEPRESSION 03/28/2007  . OTITIS MEDIA, LEFT 06/10/2009  . HYPERTENSION 03/28/2007  . CAROTID ARTERY STENOSIS, BILATERAL 07/01/2009  . PVD 06/25/2009  . HEMORRHOIDS, INTERNAL 03/24/2007  . URI 08/16/2009  . ALLERGIC RHINITIS 09/16/2007  . ESOPHAGITIS 03/24/2007  . ESOPHAGEAL STRICTURE 09/16/2007  . GERD 03/24/2007  . LOW BACK PAIN 03/28/2007  . Dizziness and giddiness 06/08/2009  . RASH-NONVESICULAR 06/17/2009  . CEPHALGIA 06/08/2009  . DYSPNEA/SHORTNESS OF BREATH 09/16/2007  . CHEST PAIN 09/16/2007  . MAGNETIC RESONANCE IMAGING, BRAIN, ABNORMAL 06/26/2009  . ALLERGY 03/24/2007  . COLONIC POLYPS, HX OF 09/16/2007  . NECK PAIN, LEFT 06/20/2010  . Palpitations 06/20/2010  . Impaired glucose tolerance 10/04/2010  . 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 Tonsillectomy    . S/p right  knee arthroscopy      reports that he has quit smoking. He has never used smokeless tobacco. He reports that he drinks about 4.2 oz of alcohol per week. He reports that he does not use illicit drugs. family history includes Coronary artery disease in his brother and sister; Heart disease in his father; Lung cancer in his sister; Lymphoma in his mother; Ovarian cancer in his sister. There is no history of Colon cancer, Esophageal cancer, Rectal cancer, or Stomach cancer. Allergies  Allergen Reactions  . Cefuroxime Axetil     Rash    Current Outpatient Prescriptions on File Prior to Visit  Medication Sig Dispense Refill  . ANDROGEL PUMP 20.25 MG/ACT (1.62%) GEL APPLY 2 PUMPS ONTO SKIN DAILY 75 g 0  . aspirin (ECOTRIN LOW STRENGTH) 81 MG EC tablet Take 81 mg by mouth daily.      . CRESTOR 40 MG tablet TAKE ONE TABLET BY MOUTH ONCE DAILY 90 tablet 3  . cyclobenzaprine (FLEXERIL) 5 MG tablet Take 1 tablet (5 mg total) by mouth 3 (three) times daily as needed. 90 tablet 3  . diclofenac (VOLTAREN) 75 MG EC tablet Take 75 mg by mouth as needed.     . fluticasone (FLONASE) 50 MCG/ACT nasal spray Place 2 sprays into both nostrils daily. 16 g 2  . hydrochlorothiazide (HYDRODIURIL) 25 MG tablet Take 1 tablet (25 mg total) by mouth daily. 30 tablet 11  . HYDROcodone-homatropine (HYCODAN) 5-1.5 MG/5ML syrup Take 5 mLs by mouth every 6 (six) hours as needed for cough. 180 mL 0  . lisinopril (PRINIVIL,ZESTRIL) 10 MG tablet Take  1 tablet (10 mg total) by mouth daily. Please d/c any previous scripts for same medication. 90 tablet 4  . metoprolol tartrate (LOPRESSOR) 25 MG tablet Take 1 tablet (25 mg total) by mouth 2 (two) times daily. 60 tablet 11  . omeprazole (PRILOSEC) 20 MG capsule TAKE ONE CAPSULE BY MOUTH TWICE DAILY 180 capsule 0  . sertraline (ZOLOFT) 50 MG tablet TAKE ONE TABLET BY MOUTH EVERY DAY 90 tablet 2  . sildenafil (VIAGRA) 100 MG tablet Take 1 tablet (100 mg total) by mouth as needed for  erectile dysfunction. 10 tablet 11  . vitamin E 600 UNIT capsule Take 600 Units by mouth daily.       No current facility-administered medications on file prior to visit.   Review of Systems  Constitutional: Negative for unusual diaphoresis or other sweats  HENT: Negative for ringing in ear Eyes: Negative for double vision or worsening visual disturbance.  Respiratory: Negative for choking and stridor.   Gastrointestinal: Negative for vomiting or other signifcant bowel change Genitourinary: Negative for hematuria or decreased urine volume.  Musculoskeletal: Negative for other MSK pain or swelling Skin: Negative for color change and worsening wound.  Neurological: Negative for tremors and numbness other than noted  Psychiatric/Behavioral: Negative for decreased concentration or agitation other than above       Objective:   Physical Exam BP 126/72 mmHg  Pulse 77  Temp(Src) 98.1 F (36.7 C) (Oral)  Ht 5' 9.5" (1.765 m)  Wt 246 lb (111.585 kg)  BMI 35.82 kg/m2 VS noted, mild ill Constitutional: Pt appears well-developed, well-nourished.  HENT: Head: NCAT.  Right Ear: External ear normal.  Left Ear: External ear normal.  Eyes: . Pupils are equal, round, and reactive to light. Conjunctivae and EOM are normal Bilat tm's with mild erythema.  Max sinus areas mild tender.  Pharynx with mild erythema, no exudate Neck: Normal range of motion. Neck supple.  Cardiovascular: Normal rate and regular rhythm.   Pulmonary/Chest: Effort normal and breath sounds without rales or wheezing.  Neurological: Pt is alert. Not confused , motor grossly intact Skin: Skin is warm. No rash Psychiatric: Pt behavior is normal. No agitation.     Assessment & Plan:

## 2014-08-03 NOTE — Patient Instructions (Signed)
Please take all new medication as prescribed - the antibiotic  OK to use the OTC rhinocort as needed  You can also take Delsym OTC for cough, and/or Mucinex (or it's generic off brand) for congestion, and tylenol as needed for pain.  OK to use the allegra, nettie pott if seems to help  Please continue all other medications as before, and refills have been done if requested.  Please have the pharmacy call with any other refills you may need.  Please keep your appointments with your specialists as you may have planned

## 2014-08-03 NOTE — Progress Notes (Signed)
Pre visit review using our clinic review tool, if applicable. No additional management support is needed unless otherwise documented below in the visit note. 

## 2014-08-05 NOTE — Assessment & Plan Note (Signed)
stable overall by history and exam, recent data reviewed with pt, and pt to continue medical treatment as before,  to f/u any worsening symptoms or concerns BP Readings from Last 3 Encounters:  08/03/14 126/72  06/13/14 126/86  12/12/13 110/74

## 2014-08-05 NOTE — Assessment & Plan Note (Signed)
stable overall by history and exam, recent data reviewed with pt, and pt to continue medical treatment as before,  to f/u any worsening symptoms or concerns Lab Results  Component Value Date   HGBA1C 6.5 06/14/2014

## 2014-08-05 NOTE — Assessment & Plan Note (Signed)
Ok for [employer] per pt preference,  to f/u any worsening symptoms or concerns

## 2014-08-05 NOTE — Assessment & Plan Note (Signed)
Mild to mod, for antibx course,  to f/u any worsening symptoms or concerns 

## 2014-08-31 ENCOUNTER — Telehealth: Payer: Self-pay

## 2014-09-03 ENCOUNTER — Telehealth: Payer: Self-pay

## 2014-09-03 NOTE — Telephone Encounter (Signed)
The patient called back and confirmed flu shot.

## 2014-09-03 NOTE — Telephone Encounter (Signed)
The patient confirmed taking a flu vaccine in October through [facility].

## 2014-09-11 ENCOUNTER — Other Ambulatory Visit: Payer: Self-pay | Admitting: Internal Medicine

## 2014-10-26 ENCOUNTER — Encounter: Payer: Self-pay | Admitting: Internal Medicine

## 2014-12-10 ENCOUNTER — Telehealth: Payer: Self-pay | Admitting: Internal Medicine

## 2014-12-13 ENCOUNTER — Encounter: Payer: Medicare Other | Admitting: Internal Medicine

## 2014-12-14 ENCOUNTER — Ambulatory Visit (INDEPENDENT_AMBULATORY_CARE_PROVIDER_SITE_OTHER): Payer: Medicare Other | Admitting: Internal Medicine

## 2014-12-14 ENCOUNTER — Other Ambulatory Visit (INDEPENDENT_AMBULATORY_CARE_PROVIDER_SITE_OTHER): Payer: Medicare Other

## 2014-12-14 ENCOUNTER — Encounter: Payer: Self-pay | Admitting: Internal Medicine

## 2014-12-14 VITALS — BP 126/84 | HR 75 | Temp 98.1°F | Ht 69.0 in | Wt 237.0 lb

## 2014-12-14 DIAGNOSIS — R7302 Impaired glucose tolerance (oral): Secondary | ICD-10-CM | POA: Diagnosis not present

## 2014-12-14 DIAGNOSIS — M4806 Spinal stenosis, lumbar region: Secondary | ICD-10-CM | POA: Diagnosis not present

## 2014-12-14 DIAGNOSIS — I6523 Occlusion and stenosis of bilateral carotid arteries: Secondary | ICD-10-CM

## 2014-12-14 DIAGNOSIS — Z0000 Encounter for general adult medical examination without abnormal findings: Secondary | ICD-10-CM

## 2014-12-14 DIAGNOSIS — I10 Essential (primary) hypertension: Secondary | ICD-10-CM | POA: Diagnosis not present

## 2014-12-14 DIAGNOSIS — E780 Pure hypercholesterolemia: Secondary | ICD-10-CM

## 2014-12-14 DIAGNOSIS — Z23 Encounter for immunization: Secondary | ICD-10-CM | POA: Diagnosis not present

## 2014-12-14 DIAGNOSIS — G9519 Other vascular myelopathies: Secondary | ICD-10-CM

## 2014-12-14 DIAGNOSIS — M48062 Spinal stenosis, lumbar region with neurogenic claudication: Secondary | ICD-10-CM

## 2014-12-14 LAB — BASIC METABOLIC PANEL
BUN: 12 mg/dL (ref 6–23)
CALCIUM: 9.6 mg/dL (ref 8.4–10.5)
CO2: 31 mEq/L (ref 19–32)
Chloride: 103 mEq/L (ref 96–112)
Creatinine, Ser: 1.12 mg/dL (ref 0.40–1.50)
GFR: 84.22 mL/min (ref >60.00)
Glucose, Bld: 89 mg/dL (ref 70–99)
Potassium: 4.2 mEq/L (ref 3.5–5.1)
SODIUM: 138 meq/L (ref 135–145)

## 2014-12-14 LAB — HEPATIC FUNCTION PANEL
ALT: 34 U/L (ref 0–53)
AST: 25 U/L (ref 0–37)
Albumin: 4.1 g/dL (ref 3.5–5.2)
Alkaline Phosphatase: 62 U/L (ref 39–117)
BILIRUBIN DIRECT: 0 mg/dL (ref 0.0–0.3)
Total Bilirubin: 0.6 mg/dL (ref 0.2–1.2)
Total Protein: 7.2 g/dL (ref 6.0–8.3)

## 2014-12-14 LAB — LIPID PANEL
CHOL/HDL RATIO: 3
Cholesterol: 158 mg/dL (ref 0–200)
HDL: 50.9 mg/dL (ref >39.00)
LDL Cholesterol: 84 mg/dL (ref 0–99)
NONHDL: 107.1
Triglycerides: 114 mg/dL (ref 0.0–149.0)
VLDL: 22.8 mg/dL (ref 0.0–40.0)

## 2014-12-14 LAB — URINALYSIS, ROUTINE W REFLEX MICROSCOPIC
BILIRUBIN URINE: NEGATIVE
Hgb urine dipstick: NEGATIVE
Ketones, ur: NEGATIVE
Leukocytes, UA: NEGATIVE
Nitrite: NEGATIVE
SPECIFIC GRAVITY, URINE: 1.015 (ref 1.000–1.030)
Total Protein, Urine: NEGATIVE
UROBILINOGEN UA: 0.2 (ref 0.0–1.0)
Urine Glucose: NEGATIVE
WBC, UA: NONE SEEN (ref 0–?)
pH: 7.5 (ref 5.0–8.0)

## 2014-12-14 LAB — CBC WITH DIFFERENTIAL/PLATELET
BASOS ABS: 0 10*3/uL (ref 0.0–0.1)
Basophils Relative: 0.3 % (ref 0.0–3.0)
EOS ABS: 0 10*3/uL (ref 0.0–0.7)
Eosinophils Relative: 0.5 % (ref 0.0–5.0)
HEMATOCRIT: 46.1 % (ref 39.0–52.0)
Hemoglobin: 15.3 g/dL (ref 13.0–17.0)
Lymphocytes Relative: 31.2 % (ref 12.0–46.0)
Lymphs Abs: 1.8 10*3/uL (ref 0.7–4.0)
MCHC: 33.2 g/dL (ref 30.0–36.0)
MCV: 89.8 fl (ref 78.0–100.0)
Monocytes Absolute: 0.5 10*3/uL (ref 0.1–1.0)
Monocytes Relative: 8.4 % (ref 3.0–12.0)
Neutro Abs: 3.5 10*3/uL (ref 1.4–7.7)
Neutrophils Relative %: 59.6 % (ref 43.0–77.0)
PLATELETS: 181 10*3/uL (ref 150.0–400.0)
RBC: 5.13 Mil/uL (ref 4.22–5.81)
RDW: 14.3 % (ref 11.5–15.5)
WBC: 5.9 10*3/uL (ref 4.0–10.5)

## 2014-12-14 LAB — HEMOGLOBIN A1C: Hgb A1c MFr Bld: 6.1 % (ref 4.6–6.5)

## 2014-12-14 LAB — TSH: TSH: 2.01 u[IU]/mL (ref 0.35–4.50)

## 2014-12-14 LAB — PSA: PSA: 0.24 ng/mL (ref 0.10–4.00)

## 2014-12-14 MED ORDER — DICLOFENAC SODIUM 75 MG PO TBEC: 75.0000 mg | DELAYED_RELEASE_TABLET | ORAL | Status: DC | PRN

## 2014-12-14 NOTE — Progress Notes (Signed)
Pre visit review using our clinic review tool, if applicable. No additional management support is needed unless otherwise documented below in the visit note. 

## 2014-12-14 NOTE — Addendum Note (Signed)
Addended by: Townsend Roger D on: 12/14/2014 02:09 PM   Modules accepted: Orders

## 2014-12-14 NOTE — Assessment & Plan Note (Signed)
Suspect due to possible spinal stenosis, is clinical dx today, for MRI, then ortho referral

## 2014-12-14 NOTE — Progress Notes (Signed)
Subjective:    Patient ID: Warren Weeks, male    DOB: Nov 06, 1945, 69 y.o.   MRN: [id number]  HPI  Here for wellness and f/u;  Overall doing ok;  Pt denies Chest pain, worsening SOB, DOE, wheezing, orthopnea, PND, worsening LE edema, palpitations, dizziness or syncope.  Pt denies neurological change such as new headache, facial or extremity weakness.  Pt denies polydipsia, polyuria, or low sugar symptoms. Pt states overall good compliance with treatment and medications, good tolerability, and has been trying to follow appropriate diet.  Pt denies worsening depressive symptoms, suicidal ideation or panic. No fever, night sweats, wt loss, loss of appetite, or other constitutional symptoms.  Pt states good ability with ADL's, has low fall risk, home safety reviewed and adequate, no other significant changes in hearing or vision, and only occasionally active with exercise.  Has ongoing bilat knee pain and ant thigh tightness. Also Pt continues to have recurring LBP mod to severe x 3 mo without bowel or bladder change, fever, wt loss,  worsening LE pain/numbness/weakness, gait change or falls.  Pain is worse with walking, gradually worse in the past yr. Pain also flares with sitting down.  Pt mentions was not contacted it seems for f/u carotids at July 2015, now overdue Past Medical History  Diagnosis Date  . GLUCOSE INTOLERANCE 09/16/2007  . HYPERCHOLESTEROLEMIA 03/24/2007  . HYPERLIPIDEMIA 09/16/2007  . OBESITY 03/24/2007  . ANXIETY 03/28/2007  . ERECTILE DYSFUNCTION 03/28/2007  . DEPRESSION 03/28/2007  . OTITIS MEDIA, LEFT 06/10/2009  . HYPERTENSION 03/28/2007  . CAROTID ARTERY STENOSIS, BILATERAL 07/01/2009  . PVD 06/25/2009  . HEMORRHOIDS, INTERNAL 03/24/2007  . URI 08/16/2009  . ALLERGIC RHINITIS 09/16/2007  . ESOPHAGITIS 03/24/2007  . ESOPHAGEAL STRICTURE 09/16/2007  . GERD 03/24/2007  . LOW BACK PAIN 03/28/2007  . Dizziness and giddiness 06/08/2009  . RASH-NONVESICULAR 06/17/2009  . CEPHALGIA  06/08/2009  . DYSPNEA/SHORTNESS OF BREATH 09/16/2007  . CHEST PAIN 09/16/2007  . MAGNETIC RESONANCE IMAGING, BRAIN, ABNORMAL 06/26/2009  . ALLERGY 03/24/2007  . COLONIC POLYPS, HX OF 09/16/2007  . NECK PAIN, LEFT 06/20/2010  . Palpitations 06/20/2010  . Impaired glucose tolerance 10/04/2010  . 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 Tonsillectomy    . S/p right knee arthroscopy      reports that he has quit smoking. He has never used smokeless tobacco. He reports that he drinks about 4.2 oz of alcohol per week. He reports that he does not use illicit drugs. family history includes Coronary artery disease in his brother and sister; Heart disease in his father; Lung cancer in his sister; Lymphoma in his mother; Ovarian cancer in his sister. There is no history of Colon cancer, Esophageal cancer, Rectal cancer, or Stomach cancer. Allergies  Allergen Reactions  . Cefuroxime Axetil     Rash    Current Outpatient Prescriptions on File Prior to Visit  Medication Sig Dispense Refill  . ANDROGEL PUMP 20.25 MG/ACT (1.62%) GEL APPLY 2 PUMPS ONTO SKIN DAILY 75 g 0  . aspirin (ECOTRIN LOW STRENGTH) 81 MG EC tablet Take 81 mg by mouth daily.      . CRESTOR 40 MG tablet TAKE ONE TABLET BY MOUTH ONCE DAILY 90 tablet 3  . diclofenac (VOLTAREN) 75 MG EC tablet Take 75 mg by mouth as needed.     . fluticasone (FLONASE) 50 MCG/ACT nasal spray Place 2 sprays into both nostrils daily. 16 g 2  . hydrochlorothiazide (HYDRODIURIL) 25  MG tablet Take 1 tablet (25 mg total) by mouth daily. 30 tablet 11  . lisinopril (PRINIVIL,ZESTRIL) 10 MG tablet Take 1 tablet (10 mg total) by mouth daily. Please d/c any previous scripts for same medication. 90 tablet 4  . metoprolol tartrate (LOPRESSOR) 25 MG tablet Take 1 tablet (25 mg total) by mouth 2 (two) times daily. 60 tablet 11  . omeprazole (PRILOSEC) 20 MG capsule TAKE ONE CAPSULE BY MOUTH TWICE DAILY 180 capsule 1  . sildenafil  (VIAGRA) 100 MG tablet Take 1 tablet (100 mg total) by mouth as needed for erectile dysfunction. 10 tablet 11  . vitamin E 600 UNIT capsule Take 600 Units by mouth daily.       No current facility-administered medications on file prior to visit.      Review of Systems Constitutional: Negative for increased diaphoresis, other activity, appetite or siginficant weight change other than noted HENT: Negative for worsening hearing loss, ear pain, facial swelling, mouth sores and neck stiffness.   Eyes: Negative for other worsening pain, redness or visual disturbance.  Respiratory: Negative for shortness of breath and wheezing  Cardiovascular: Negative for chest pain and palpitations.  Gastrointestinal: Negative for diarrhea, blood in stool, abdominal distention or other pain Genitourinary: Negative for hematuria, flank pain or change in urine volume.  Musculoskeletal: Negative for myalgias or other joint complaints.  Skin: Negative for color change and wound or drainage.  Neurological: Negative for syncope and numbness. other than noted Hematological: Negative for adenopathy. or other swelling Psychiatric/Behavioral: Negative for hallucinations, SI, self-injury, decreased concentration or other worsening agitation.      Objective:   Physical Exam BP 126/84 mmHg  Pulse 75  Temp(Src) 98.1 F (36.7 C) (Oral)  Ht 5\' 9"  (1.753 m)  Wt 237 lb (107.502 kg)  BMI 34.98 kg/m2 VS noted,  Constitutional: Pt is oriented to person, place, and time. Appears well-developed and well-nourished, in no significant distress Head: Normocephalic and atraumatic.  Right Ear: External ear normal.  Left Ear: External ear normal.  Nose: Nose normal.  Mouth/Throat: Oropharynx is clear and moist.  Eyes: Conjunctivae and EOM are normal. Pupils are equal, round, and reactive to light.  Neck: Normal range of motion. Neck supple. No JVD present. No tracheal deviation present or significant neck LA or  mass Cardiovascular: Normal rate, regular rhythm, normal heart sounds and intact distal pulses.   Pulmonary/Chest: Effort normal and breath sounds without rales or wheezing  Abdominal: Soft. Bowel sounds are normal. NT. No HSM  Musculoskeletal: Normal range of motion. Exhibits no edema.  Lymphadenopathy:  Has no cervical adenopathy.  Neurological: Pt is alert and oriented to person, place, and time. Pt has normal reflexes. No cranial nerve deficit. Motor grossly intact Skin: Skin is warm and dry. No rash noted.  Psychiatric:  Has normal mood and affect. Behavior is normal.      Assessment & Plan:

## 2014-12-14 NOTE — Assessment & Plan Note (Signed)

## 2014-12-14 NOTE — Assessment & Plan Note (Signed)
Due for f/u  - will refer for study

## 2014-12-14 NOTE — Patient Instructions (Addendum)
You had the Pneumovax pneumonia shot today  Please continue all other medications as before, and refills have been done if requested.  Please have the pharmacy call with any other refills you may need.  Please continue your efforts at being more active, low cholesterol diet, and weight control.  You are otherwise up to date with prevention measures today.  Please keep your appointments with your specialists as you may have planned  You will be contacted regarding the referral for: MRI for the lower back, and orthopedic referral, as well as the Carotid study  Please go to the LAB in the Basement (turn left off the elevator) for the tests to be done today  You will be contacted by phone if any changes need to be made immediately.  Otherwise, you will receive a letter about your results with an explanation, but please check with MyChart first.  Please remember to sign up for MyChart if you have not done so, as this will be important to you in the future with finding out test results, communicating by private email, and scheduling acute appointments online when needed.  Please return in 6 months, or sooner if needed, with Lab testing done 3-5 days before

## 2014-12-19 ENCOUNTER — Ambulatory Visit (HOSPITAL_COMMUNITY): Payer: Medicare Other | Attending: Cardiovascular Disease

## 2014-12-19 DIAGNOSIS — E785 Hyperlipidemia, unspecified: Secondary | ICD-10-CM | POA: Diagnosis not present

## 2014-12-19 DIAGNOSIS — I739 Peripheral vascular disease, unspecified: Secondary | ICD-10-CM | POA: Diagnosis not present

## 2014-12-19 DIAGNOSIS — I10 Essential (primary) hypertension: Secondary | ICD-10-CM | POA: Insufficient documentation

## 2014-12-19 DIAGNOSIS — I6523 Occlusion and stenosis of bilateral carotid arteries: Secondary | ICD-10-CM | POA: Diagnosis not present

## 2014-12-19 DIAGNOSIS — Z87891 Personal history of nicotine dependence: Secondary | ICD-10-CM | POA: Insufficient documentation

## 2014-12-20 ENCOUNTER — Other Ambulatory Visit: Payer: Self-pay | Admitting: Internal Medicine

## 2015-01-08 ENCOUNTER — Telehealth: Payer: Self-pay | Admitting: Internal Medicine

## 2015-01-08 NOTE — Telephone Encounter (Signed)
Patient stated he would like for you to change his Crestor to the generic brand.

## 2015-01-09 ENCOUNTER — Other Ambulatory Visit: Payer: Self-pay | Admitting: Internal Medicine

## 2015-01-09 ENCOUNTER — Encounter: Payer: Self-pay | Admitting: Internal Medicine

## 2015-01-09 NOTE — Telephone Encounter (Signed)
Pt advised via VM to request generic medication from pharmacy as Rx is not written for Brand Name Only

## 2015-01-11 ENCOUNTER — Other Ambulatory Visit: Payer: Self-pay | Admitting: Internal Medicine

## 2015-02-05 ENCOUNTER — Telehealth: Payer: Self-pay | Admitting: Internal Medicine

## 2015-02-05 NOTE — Telephone Encounter (Signed)
See referral note

## 2015-02-05 NOTE — Telephone Encounter (Signed)
Pt called and insurance has denied the MRI and they suggested he try muscle relaxers. Please advise pt. Pharmacy is [facility] on [address]

## 2015-02-07 MED ORDER — CYCLOBENZAPRINE HCL 5 MG PO TABS: 5.0000 mg | ORAL_TABLET | Freq: Three times a day (TID) | ORAL | Status: DC | PRN

## 2015-02-07 NOTE — Telephone Encounter (Signed)
Pt advised via VM 

## 2015-02-07 NOTE — Telephone Encounter (Signed)
Done erx 

## 2015-02-07 NOTE — Telephone Encounter (Signed)
Pt called back. Not sure what note I need to see.  Pt wondering if he should try muscle relaxers Please advise

## 2015-03-01 ENCOUNTER — Other Ambulatory Visit: Payer: Self-pay | Admitting: Internal Medicine

## 2015-04-08 ENCOUNTER — Other Ambulatory Visit: Payer: Self-pay | Admitting: Internal Medicine

## 2015-04-10 ENCOUNTER — Encounter: Payer: Self-pay | Admitting: Internal Medicine

## 2015-04-10 ENCOUNTER — Ambulatory Visit (INDEPENDENT_AMBULATORY_CARE_PROVIDER_SITE_OTHER): Payer: Medicare Other | Admitting: Internal Medicine

## 2015-04-10 VITALS — BP 128/80 | HR 75 | Temp 98.5°F | Wt 234.0 lb

## 2015-04-10 DIAGNOSIS — I10 Essential (primary) hypertension: Secondary | ICD-10-CM | POA: Diagnosis not present

## 2015-04-10 DIAGNOSIS — R7302 Impaired glucose tolerance (oral): Secondary | ICD-10-CM | POA: Diagnosis not present

## 2015-04-10 DIAGNOSIS — J069 Acute upper respiratory infection, unspecified: Secondary | ICD-10-CM

## 2015-04-10 DIAGNOSIS — Z23 Encounter for immunization: Secondary | ICD-10-CM

## 2015-04-10 MED ORDER — AZITHROMYCIN 250 MG PO TABS: ORAL_TABLET | ORAL | Status: DC

## 2015-04-10 NOTE — Addendum Note (Signed)
Addended by: Cresenciano Lick on: 04/10/2015 04:06 PM   Modules accepted: Orders

## 2015-04-10 NOTE — Assessment & Plan Note (Signed)
Mild to mod, for antibx course,  to f/u any worsening symptoms or concerns 

## 2015-04-10 NOTE — Progress Notes (Signed)
Pre visit review using our clinic review tool, if applicable. No additional management support is needed unless otherwise documented below in the visit note. 

## 2015-04-10 NOTE — Assessment & Plan Note (Signed)
stable overall by history and exam, recent data reviewed with pt, and pt to continue medical treatment as before,  to f/u any worsening symptoms or concerns Lab Results  Component Value Date   HGBA1C 6.1 12/14/2014   Pt to call for any worsening polys with illness

## 2015-04-10 NOTE — Patient Instructions (Addendum)
You had the flu shot today  Please take all new medication as prescribed- the antibiotic  Please continue all other medications as before, and refills have been done if requested.  Please have the pharmacy call with any other refills you may need.  Please continue your efforts at being more active, low cholesterol diet, and weight control.  Please keep your appointments with your specialists as you may have planned

## 2015-04-10 NOTE — Progress Notes (Signed)
Subjective:    Patient ID: Warren Weeks, male    DOB: 03-23-1946, 69 y.o.   MRN: [id number]  HPI    Here with 2-3 days acute onset fever, facial pain, pressure, headache, general weakness and malaise, and greenish d/c, with mild ST and cough, but pt denies chest pain, wheezing, increased sob or doe, orthopnea, PND, increased LE swelling, palpitations, dizziness or syncope.   Pt denies polydipsia, polyuria, or low sugar symptoms such as weakness or confusion improved with po intake.  Pt states overall good compliance with meds, trying to follow lower cholesterol, diabetic diet, wt overall down with better diet, but little exercise however.   Wt Readings from Last 3 Encounters:  04/10/15 234 lb (106.142 kg)  12/14/14 237 lb (107.502 kg)  08/03/14 246 lb (111.585 kg)   Past Medical History  Diagnosis Date  . GLUCOSE INTOLERANCE 09/16/2007  . HYPERCHOLESTEROLEMIA 03/24/2007  . HYPERLIPIDEMIA 09/16/2007  . OBESITY 03/24/2007  . ANXIETY 03/28/2007  . ERECTILE DYSFUNCTION 03/28/2007  . DEPRESSION 03/28/2007  . OTITIS MEDIA, LEFT 06/10/2009  . HYPERTENSION 03/28/2007  . CAROTID ARTERY STENOSIS, BILATERAL 07/01/2009  . PVD 06/25/2009  . HEMORRHOIDS, INTERNAL 03/24/2007  . URI 08/16/2009  . ALLERGIC RHINITIS 09/16/2007  . ESOPHAGITIS 03/24/2007  . ESOPHAGEAL STRICTURE 09/16/2007  . GERD 03/24/2007  . LOW BACK PAIN 03/28/2007  . Dizziness and giddiness 06/08/2009  . RASH-NONVESICULAR 06/17/2009  . CEPHALGIA 06/08/2009  . DYSPNEA/SHORTNESS OF BREATH 09/16/2007  . CHEST PAIN 09/16/2007  . MAGNETIC RESONANCE IMAGING, BRAIN, ABNORMAL 06/26/2009  . ALLERGY 03/24/2007  . COLONIC POLYPS, HX OF 09/16/2007  . NECK PAIN, LEFT 06/20/2010  . Palpitations 06/20/2010  . Impaired glucose tolerance 10/04/2010  . 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 Tonsillectomy    . S/p right knee arthroscopy      reports that he has quit smoking. He has never used smokeless tobacco. He  reports that he drinks about 4.2 oz of alcohol per week. He reports that he does not use illicit drugs. family history includes Coronary artery disease in his brother and sister; Heart disease in his father; Lung cancer in his sister; Lymphoma in his mother; Ovarian cancer in his sister. There is no history of Colon cancer, Esophageal cancer, Rectal cancer, or Stomach cancer. Allergies  Allergen Reactions  . Cefuroxime Axetil     Rash    Current Outpatient Prescriptions on File Prior to Visit  Medication Sig Dispense Refill  . aspirin (ECOTRIN LOW STRENGTH) 81 MG EC tablet Take 81 mg by mouth daily.      . cyclobenzaprine (FLEXERIL) 5 MG tablet Take 1 tablet (5 mg total) by mouth 3 (three) times daily as needed for muscle spasms. 60 tablet 1  . diclofenac (VOLTAREN) 75 MG EC tablet Take 1 tablet (75 mg total) by mouth as needed. 60 tablet 11  . fluticasone (FLONASE) 50 MCG/ACT nasal spray USE TWO SPRAY(S) IN EACH NOSTRIL ONCE DAILY 16 g 5  . hydrochlorothiazide (HYDRODIURIL) 25 MG tablet TAKE ONE TABLET BY MOUTH ONCE DAILY. 30 tablet 11  . lisinopril (PRINIVIL,ZESTRIL) 10 MG tablet Take 1 tablet (10 mg total) by mouth daily. Please d/c any previous scripts for same medication. 90 tablet 4  . metoprolol tartrate (LOPRESSOR) 25 MG tablet TAKE ONE TABLET BY MOUTH TWICE DAILY 60 tablet 2  . Omega-3 Fatty Acids (FISH OIL) 1000 MG CAPS Take 1 capsule by mouth daily.    Marland Kitchen omeprazole (PRILOSEC) 20  MG capsule TAKE ONE CAPSULE BY MOUTH TWICE DAILY 180 capsule 1  . rosuvastatin (CRESTOR) 40 MG tablet TAKE ONE TABLET BY MOUTH ONCE DAILY 90 tablet 2  . sildenafil (VIAGRA) 100 MG tablet Take 1 tablet (100 mg total) by mouth as needed for erectile dysfunction. 10 tablet 11  . vitamin E 600 UNIT capsule Take 600 Units by mouth daily.      . ANDROGEL PUMP 20.25 MG/ACT (1.62%) GEL APPLY 2 PUMPS ONTO SKIN DAILY (Patient not taking: Reported on 04/10/2015) 75 g 0   No current facility-administered medications on  file prior to visit.     Review of Systems  Constitutional: Negative for unusual diaphoresis or night sweats HENT: Negative for ringing in ear or discharge Eyes: Negative for double vision or worsening visual disturbance.  Respiratory: Negative for choking and stridor.   Gastrointestinal: Negative for vomiting or other signifcant bowel change Genitourinary: Negative for hematuria or change in urine volume.  Musculoskeletal: Negative for other MSK pain or swelling Skin: Negative for color change and worsening wound.  Neurological: Negative for tremors and numbness other than noted  Psychiatric/Behavioral: Negative for decreased concentration or agitation other than above       Objective:   Physical Exam BP 128/80 mmHg  Pulse 75  Temp(Src) 98.5 F (36.9 C) (Oral)  Wt 234 lb (106.142 kg)  SpO2 94% VS noted, mild ill Constitutional: Pt appears in no significant distress HENT: Head: NCAT.  Right Ear: External ear normal.  Left Ear: External ear normal.  Eyes: . Pupils are equal, round, and reactive to light. Conjunctivae and EOM are normal Bilat tm's with mild erythema.  Max sinus areas mild tender.  Pharynx with mild erythema, no exudate Neck: Normal range of motion. Neck supple.with marked bilat submandib LA tender  Cardiovascular: Normal rate and regular rhythm.   Pulmonary/Chest: Effort normal and breath sounds without rales or wheezing.  Neurological: Pt is alert. Not confused , motor grossly intact Skin: Skin is warm. No rash, no LE edema Psychiatric: Pt behavior is normal. No agitation.     Assessment & Plan:

## 2015-04-10 NOTE — Assessment & Plan Note (Signed)
stable overall by history and exam, recent data reviewed with pt, and pt to continue medical treatment as before,  to f/u any worsening symptoms or concerns BP Readings from Last 3 Encounters:  04/10/15 128/80  12/14/14 126/84  08/03/14 126/72

## 2015-04-14 ENCOUNTER — Other Ambulatory Visit: Payer: Self-pay | Admitting: Internal Medicine

## 2015-04-15 ENCOUNTER — Telehealth: Payer: Self-pay | Admitting: Internal Medicine

## 2015-04-15 NOTE — Telephone Encounter (Signed)
If he has not seen any improvement with the zpak then we should try a different antibiotic.  He was on levaquin in feb and if he agrees I will send that to his pharmacy.

## 2015-04-15 NOTE — Telephone Encounter (Signed)
MD out of office. Pls advise...Johny Chess

## 2015-04-15 NOTE — Telephone Encounter (Signed)
Patient was in last week and was given a Zpack and he still is not feeling well. There was a refill on the prescription. He is wondering should he get that refilled? Please give him a call at [phone]

## 2015-04-16 ENCOUNTER — Other Ambulatory Visit: Payer: Self-pay | Admitting: Internal Medicine

## 2015-04-16 NOTE — Telephone Encounter (Signed)
Called pt spoke wife she stated he ended up getting the refill on zpac. If don't feel better after second round he will come back to be evaluated...Warren Weeks

## 2015-05-20 ENCOUNTER — Other Ambulatory Visit: Payer: Self-pay | Admitting: Internal Medicine

## 2015-10-17 ENCOUNTER — Other Ambulatory Visit: Payer: Self-pay | Admitting: Internal Medicine

## 2015-10-24 ENCOUNTER — Ambulatory Visit (INDEPENDENT_AMBULATORY_CARE_PROVIDER_SITE_OTHER): Payer: Medicare Other | Admitting: Internal Medicine

## 2015-10-24 ENCOUNTER — Other Ambulatory Visit (INDEPENDENT_AMBULATORY_CARE_PROVIDER_SITE_OTHER): Payer: Medicare Other

## 2015-10-24 ENCOUNTER — Encounter: Payer: Self-pay | Admitting: Internal Medicine

## 2015-10-24 VITALS — BP 160/98 | HR 72 | Temp 98.0°F | Resp 20 | Wt 241.0 lb

## 2015-10-24 DIAGNOSIS — R6889 Other general symptoms and signs: Secondary | ICD-10-CM

## 2015-10-24 DIAGNOSIS — Z1159 Encounter for screening for other viral diseases: Secondary | ICD-10-CM

## 2015-10-24 DIAGNOSIS — R7302 Impaired glucose tolerance (oral): Secondary | ICD-10-CM | POA: Diagnosis not present

## 2015-10-24 DIAGNOSIS — I10 Essential (primary) hypertension: Secondary | ICD-10-CM

## 2015-10-24 DIAGNOSIS — Z0001 Encounter for general adult medical examination with abnormal findings: Secondary | ICD-10-CM

## 2015-10-24 DIAGNOSIS — E785 Hyperlipidemia, unspecified: Secondary | ICD-10-CM | POA: Diagnosis not present

## 2015-10-24 DIAGNOSIS — G4710 Hypersomnia, unspecified: Secondary | ICD-10-CM

## 2015-10-24 LAB — HEPATIC FUNCTION PANEL
ALT: 51 U/L (ref 0–53)
AST: 39 U/L — AB (ref 0–37)
Albumin: 4.4 g/dL (ref 3.5–5.2)
Alkaline Phosphatase: 68 U/L (ref 39–117)
BILIRUBIN DIRECT: 0.1 mg/dL (ref 0.0–0.3)
BILIRUBIN TOTAL: 0.6 mg/dL (ref 0.2–1.2)
TOTAL PROTEIN: 7.3 g/dL (ref 6.0–8.3)

## 2015-10-24 LAB — TSH: TSH: 2.18 u[IU]/mL (ref 0.35–4.50)

## 2015-10-24 LAB — BASIC METABOLIC PANEL
BUN: 20 mg/dL (ref 6–23)
CHLORIDE: 103 meq/L (ref 96–112)
CO2: 27 meq/L (ref 19–32)
CREATININE: 1.36 mg/dL (ref 0.40–1.50)
Calcium: 9.7 mg/dL (ref 8.4–10.5)
GFR: 67.14 mL/min (ref >60.00)
Glucose, Bld: 109 mg/dL — ABNORMAL HIGH (ref 70–99)
POTASSIUM: 4.3 meq/L (ref 3.5–5.1)
Sodium: 139 mEq/L (ref 135–145)

## 2015-10-24 LAB — CBC WITH DIFFERENTIAL/PLATELET
BASOS ABS: 0 10*3/uL (ref 0.0–0.1)
BASOS PCT: 0.2 % (ref 0.0–3.0)
EOS ABS: 0.1 10*3/uL (ref 0.0–0.7)
Eosinophils Relative: 1.8 % (ref 0.0–5.0)
HCT: 45.5 % (ref 39.0–52.0)
Hemoglobin: 15.2 g/dL (ref 13.0–17.0)
LYMPHS ABS: 1.3 10*3/uL (ref 0.7–4.0)
LYMPHS PCT: 19.7 % (ref 12.0–46.0)
MCHC: 33.3 g/dL (ref 30.0–36.0)
MCV: 89.9 fl (ref 78.0–100.0)
MONO ABS: 0.4 10*3/uL (ref 0.1–1.0)
Monocytes Relative: 6.3 % (ref 3.0–12.0)
NEUTROS ABS: 4.8 10*3/uL (ref 1.4–7.7)
NEUTROS PCT: 72 % (ref 43.0–77.0)
PLATELETS: 176 10*3/uL (ref 150.0–400.0)
RBC: 5.06 Mil/uL (ref 4.22–5.81)
RDW: 14.6 % (ref 11.5–15.5)
WBC: 6.7 10*3/uL (ref 4.0–10.5)

## 2015-10-24 LAB — LIPID PANEL
CHOL/HDL RATIO: 3
Cholesterol: 181 mg/dL (ref 0–200)
HDL: 59.1 mg/dL (ref >39.00)
LDL Cholesterol: 96 mg/dL (ref 0–99)
NONHDL: 121.85
TRIGLYCERIDES: 130 mg/dL (ref 0.0–149.0)
VLDL: 26 mg/dL (ref 0.0–40.0)

## 2015-10-24 LAB — PSA: PSA: 0.12 ng/mL (ref 0.10–4.00)

## 2015-10-24 LAB — URINALYSIS, ROUTINE W REFLEX MICROSCOPIC
Bilirubin Urine: NEGATIVE
Hgb urine dipstick: NEGATIVE
KETONES UR: NEGATIVE
Leukocytes, UA: NEGATIVE
Nitrite: NEGATIVE
PH: 6 (ref 5.0–8.0)
RBC / HPF: NONE SEEN (ref 0–?)
SPECIFIC GRAVITY, URINE: 1.02 (ref 1.000–1.030)
Total Protein, Urine: NEGATIVE
UROBILINOGEN UA: 0.2 (ref 0.0–1.0)
Urine Glucose: NEGATIVE
WBC, UA: NONE SEEN (ref 0–?)

## 2015-10-24 LAB — HEMOGLOBIN A1C: HEMOGLOBIN A1C: 6.5 % (ref 4.6–6.5)

## 2015-10-24 MED ORDER — TRIAMCINOLONE ACETONIDE 55 MCG/ACT NA AERO: 2.0000 | INHALATION_SPRAY | Freq: Every day | NASAL | Status: DC

## 2015-10-24 MED ORDER — CETIRIZINE HCL 10 MG PO TABS: 10.0000 mg | ORAL_TABLET | Freq: Every day | ORAL | Status: DC

## 2015-10-24 NOTE — Progress Notes (Signed)
Subjective:    Patient ID: Warren Weeks, male    DOB: 10/15/1945, 70 y.o.   MRN: [id number]  HPI  Here for wellness and f/u;  Overall doing ok;  Pt denies Chest pain, worsening SOB, DOE, wheezing, orthopnea, PND, worsening LE edema, palpitations, dizziness or syncope.  Pt denies neurological change such as new headache, facial or extremity weakness.  Pt denies polydipsia, polyuria, or low sugar symptoms. Pt states overall good compliance with treatment and medications, good tolerability, and has been trying to follow appropriate diet.  Pt denies worsening depressive symptoms, suicidal ideation or panic. No fever, night sweats, wt loss, loss of appetite, or other constitutional symptoms.  Pt states good ability with ADL's, has low fall risk, home safety reviewed and adequate, no other significant changes in hearing or vision, and only occasionally active with exercise.  BP at home and [employer] controlled, ? Some white coat htn. Has some sadness over sister death nov October 02, 2014. Has gained several lbs.  Does have several wks ongoing nasal allergy symptoms with clearish congestion, itch and sneezing, without fever, pain, ST, cough, swelling or wheezing.  Has some snorning  Also with worsening several months hypersomnolence and snoring at night.  Pt continues to have recurring LBP without change in severity, bowel or bladder change, fever, wt loss,  worsening LE pain/numbness/weakness, gait change or falls.  Does c/o ongoing fatigue, and has  signficant daytime hypersomnolence, but declines referral for now.  Also has bilat feet aching, plans to self refer to podiatry. Past Medical History  Diagnosis Date  . GLUCOSE INTOLERANCE 09/16/2007  . HYPERCHOLESTEROLEMIA 03/24/2007  . HYPERLIPIDEMIA 09/16/2007  . OBESITY 03/24/2007  . ANXIETY 03/28/2007  . ERECTILE DYSFUNCTION 03/28/2007  . DEPRESSION 03/28/2007  . OTITIS MEDIA, LEFT 06/10/2009  . HYPERTENSION 03/28/2007  . CAROTID ARTERY STENOSIS, BILATERAL 07/01/2009  .  PVD 06/25/2009  . HEMORRHOIDS, INTERNAL 03/24/2007  . URI 08/16/2009  . ALLERGIC RHINITIS 09/16/2007  . ESOPHAGITIS 03/24/2007  . ESOPHAGEAL STRICTURE 09/16/2007  . GERD 03/24/2007  . LOW BACK PAIN 03/28/2007  . Dizziness and giddiness 06/08/2009  . RASH-NONVESICULAR 06/17/2009  . CEPHALGIA 06/08/2009  . DYSPNEA/SHORTNESS OF BREATH 09/16/2007  . CHEST PAIN 09/16/2007  . MAGNETIC RESONANCE IMAGING, BRAIN, ABNORMAL 06/26/2009  . ALLERGY 03/24/2007  . COLONIC POLYPS, HX OF 09/16/2007  . NECK PAIN, LEFT 06/20/2010  . Palpitations 06/20/2010  . Impaired glucose tolerance 10/04/2010  . 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 Tonsillectomy    . S/p right knee arthroscopy      reports that he has quit smoking. He has never used smokeless tobacco. He reports that he drinks about 4.2 oz of alcohol per week. He reports that he does not use illicit drugs. family history includes Coronary artery disease in his brother and sister; Heart disease in his father; Lung cancer in his sister; Lymphoma in his mother; Ovarian cancer in his sister. There is no history of Colon cancer, Esophageal cancer, Rectal cancer, or Stomach cancer. Allergies  Allergen Reactions  . Cefuroxime Axetil     Rash     Current Outpatient Prescriptions on File Prior to Visit  Medication Sig Dispense Refill  . ANDROGEL PUMP 20.25 MG/ACT (1.62%) GEL APPLY 2 PUMPS ONTO SKIN DAILY 75 g 0  . aspirin (ECOTRIN LOW STRENGTH) 81 MG EC tablet Take 81 mg by mouth daily.      . cyclobenzaprine (FLEXERIL) 5 MG tablet Take 1 tablet (5 mg total)  by mouth 3 (three) times daily as needed for muscle spasms. 60 tablet 1  . diclofenac (VOLTAREN) 75 MG EC tablet Take 1 tablet (75 mg total) by mouth as needed. 60 tablet 11  . hydrochlorothiazide (HYDRODIURIL) 25 MG tablet TAKE ONE TABLET BY MOUTH ONCE DAILY. 30 tablet 11  . lisinopril (PRINIVIL,ZESTRIL) 10 MG tablet TAKE ONE TABLET BY MOUTH ONCE DAILY 90 tablet 0    . metoprolol tartrate (LOPRESSOR) 25 MG tablet TAKE ONE TABLET BY MOUTH TWICE DAILY 60 tablet 0  . Omega-3 Fatty Acids (FISH OIL) 1000 MG CAPS Take 1 capsule by mouth daily.    Marland Kitchen omeprazole (PRILOSEC) 20 MG capsule TAKE ONE CAPSULE BY MOUTH TWICE DAILY 180 capsule 1  . rosuvastatin (CRESTOR) 40 MG tablet TAKE ONE TABLET BY MOUTH ONCE DAILY 90 tablet 2  . sildenafil (VIAGRA) 100 MG tablet Take 1 tablet (100 mg total) by mouth as needed for erectile dysfunction. 10 tablet 11  . terazosin (HYTRIN) 5 MG capsule Take 1 capsule by mouth daily.    . vitamin E 600 UNIT capsule Take 600 Units by mouth daily.       No current facility-administered medications on file prior to visit.   Review of Systems Constitutional: Negative for increased diaphoresis, or other activity, appetite or siginficant weight change other than noted HENT: Negative for worsening hearing loss, ear pain, facial swelling, mouth sores and neck stiffness.   Eyes: Negative for other worsening pain, redness or visual disturbance.  Respiratory: Negative for choking or stridor Cardiovascular: Negative for other chest pain and palpitations.  Gastrointestinal: Negative for worsening diarrhea, blood in stool, or abdominal distention Genitourinary: Negative for hematuria, flank pain or change in urine volume.  Musculoskeletal: Negative for myalgias or other joint complaints.  Skin: Negative for other color change and wound or drainage.  Neurological: Negative for syncope and numbness. other than noted Hematological: Negative for adenopathy. or other swelling Psychiatric/Behavioral: Negative for hallucinations, SI, self-injury, decreased concentration or other worsening agitation.  '    Objective:   Physical Exam BP 160/98 mmHg  Pulse 72  Temp(Src) 98 F (36.7 C) (Oral)  Resp 20  Wt 241 lb (109.317 kg)  SpO2 93% BP 160/98 mmHg  Pulse 72  Temp(Src) 98 F (36.7 C) (Oral)  Resp 20  Wt 241 lb (109.317 kg)  SpO2 93% VS noted,   Constitutional: Pt is oriented to person, place, and time. Appears well-developed and well-nourished, in no significant distress Head: Normocephalic and atraumatic  Eyes: Conjunctivae and EOM are normal. Pupils are equal, round, and reactive to light Right Ear: External ear normal.  Left Ear: External ear normal Nose: Nose normal.  Mouth/Throat: Oropharynx is clear and moist  Neck: Normal range of motion. Neck supple. No JVD present. No tracheal deviation present or significant neck LA or mass Cardiovascular: Normal rate, regular rhythm, normal heart sounds and intact distal pulses.   Pulmonary/Chest: Effort normal and breath sounds without rales or wheezing  Abdominal: Soft. Bowel sounds are normal. NT. No HSM  Musculoskeletal: Normal range of motion. Exhibits no edema Lymphadenopathy: Has no cervical adenopathy.  Neurological: Pt is alert and oriented to person, place, and time. Pt has normal reflexes. No cranial nerve deficit. Motor grossly intact Skin: Skin is warm and dry. No rash noted or new ulcers Psychiatric:  Has normal mood and affect. Behavior is normal.     Assessment & Plan:

## 2015-10-24 NOTE — Assessment & Plan Note (Signed)

## 2015-10-24 NOTE — Patient Instructions (Signed)
Please take all new medication as prescribed - the nasacort  Please continue all other medications as before, and refills have been done if requested.  Please have the pharmacy call with any other refills you may need.  Please continue your efforts at being more active, low cholesterol diet, and weight control.  You are otherwise up to date with prevention measures today.  Please keep your appointments with your specialists as you may have planned  Please call if you change your mind about seeing pulmonary for possible sleep apnea  Please go to the LAB in the Basement (turn left off the elevator) for the tests to be done today  You will be contacted by phone if any changes need to be made immediately.  Otherwise, you will receive a letter about your results with an explanation, but please check with MyChart first.  Please remember to sign up for MyChart if you have not done so, as this will be important to you in the future with finding out test results, communicating by private email, and scheduling acute appointments online when needed.  Please return in 6 months, or sooner if needed

## 2015-10-24 NOTE — Assessment & Plan Note (Signed)
Mild elev today, but at home pt states < 140.90, stable overall by history and exam, recent data reviewed with pt, and pt to continue medical treatment as before,  to f/u any worsening symptoms or concerns BP Readings from Last 3 Encounters:  10/24/15 160/98  04/10/15 128/80  12/14/14 126/84

## 2015-10-24 NOTE — Assessment & Plan Note (Signed)
stable overall by history and exam, recent data reviewed with pt, and pt to continue medical treatment as before,  to f/u any worsening symptoms or concerns Lab Results  Component Value Date   LDLCALC 84 12/14/2014   For f/u lab today

## 2015-10-24 NOTE — Assessment & Plan Note (Signed)
supsect probable sympt sleep apnea, but declines referral for now, wants to try allergy med such as nasacort first to see if snoring resolved

## 2015-10-24 NOTE — Assessment & Plan Note (Signed)
stable overall by history and exam, recent data reviewed with pt, and pt to continue medical treatment as before,  to f/u any worsening symptoms or concerns Lab Results  Component Value Date   HGBA1C 6.1 12/14/2014

## 2015-10-25 LAB — HEPATITIS C ANTIBODY: HCV Ab: NEGATIVE

## 2015-10-28 ENCOUNTER — Telehealth: Payer: Self-pay

## 2015-10-28 NOTE — Telephone Encounter (Signed)
Received faxed notification from [facility]: This medication is on your plan's list of covered drugs.  Prior authorization is not required at this time

## 2015-10-28 NOTE — Telephone Encounter (Signed)
PA initiated via CoverMyMeds key [id number]

## 2015-11-15 ENCOUNTER — Other Ambulatory Visit: Payer: Self-pay | Admitting: Internal Medicine

## 2015-11-15 DIAGNOSIS — I6523 Occlusion and stenosis of bilateral carotid arteries: Secondary | ICD-10-CM

## 2015-11-20 ENCOUNTER — Other Ambulatory Visit: Payer: Self-pay | Admitting: Internal Medicine

## 2015-11-21 MED ORDER — METOPROLOL TARTRATE 50 MG PO TABS: 50.0000 mg | ORAL_TABLET | Freq: Every day | ORAL | Status: DC

## 2015-11-21 NOTE — Addendum Note (Signed)
Addended by: Earnstine Regal on: 11/21/2015 11:14 AM   Modules accepted: Orders, Medications

## 2015-11-21 NOTE — Telephone Encounter (Signed)
Receive fax stating Metoprolol Tartrate 25 mg is on back onrder requesting rx to be sent for 50 mg.../lmb

## 2015-11-26 ENCOUNTER — Other Ambulatory Visit: Payer: Self-pay | Admitting: Internal Medicine

## 2015-12-20 ENCOUNTER — Ambulatory Visit (HOSPITAL_COMMUNITY)
Admission: RE | Admit: 2015-12-20 | Discharge: 2015-12-20 | Disposition: A | Discharge status: Home / Self Care | Payer: Medicare Other | Source: Ambulatory Visit | Attending: Internal Medicine | Admitting: Internal Medicine

## 2015-12-20 ENCOUNTER — Encounter: Payer: Self-pay | Admitting: Internal Medicine

## 2015-12-20 DIAGNOSIS — F329 Major depressive disorder, single episode, unspecified: Secondary | ICD-10-CM | POA: Insufficient documentation

## 2015-12-20 DIAGNOSIS — K219 Gastro-esophageal reflux disease without esophagitis: Secondary | ICD-10-CM | POA: Insufficient documentation

## 2015-12-20 DIAGNOSIS — I10 Essential (primary) hypertension: Secondary | ICD-10-CM | POA: Diagnosis not present

## 2015-12-20 DIAGNOSIS — F419 Anxiety disorder, unspecified: Secondary | ICD-10-CM | POA: Insufficient documentation

## 2015-12-20 DIAGNOSIS — E7800 Pure hypercholesterolemia, unspecified: Secondary | ICD-10-CM | POA: Diagnosis not present

## 2015-12-20 DIAGNOSIS — I6523 Occlusion and stenosis of bilateral carotid arteries: Secondary | ICD-10-CM

## 2015-12-22 ENCOUNTER — Observation Stay (HOSPITAL_COMMUNITY): Payer: Medicare Other

## 2015-12-22 ENCOUNTER — Emergency Department (HOSPITAL_COMMUNITY): Payer: Medicare Other

## 2015-12-22 ENCOUNTER — Observation Stay (HOSPITAL_COMMUNITY)
Admission: EM | Admit: 2015-12-22 | Discharge: 2015-12-24 | Disposition: A | Discharge status: Home / Self Care | Payer: Medicare Other | Attending: Internal Medicine | Admitting: Internal Medicine

## 2015-12-22 ENCOUNTER — Encounter (HOSPITAL_COMMUNITY): Payer: Self-pay | Admitting: Emergency Medicine

## 2015-12-22 DIAGNOSIS — H8110 Benign paroxysmal vertigo, unspecified ear: Secondary | ICD-10-CM | POA: Diagnosis not present

## 2015-12-22 DIAGNOSIS — E785 Hyperlipidemia, unspecified: Secondary | ICD-10-CM | POA: Diagnosis not present

## 2015-12-22 DIAGNOSIS — I10 Essential (primary) hypertension: Secondary | ICD-10-CM | POA: Diagnosis present

## 2015-12-22 DIAGNOSIS — Z87891 Personal history of nicotine dependence: Secondary | ICD-10-CM | POA: Diagnosis not present

## 2015-12-22 DIAGNOSIS — R262 Difficulty in walking, not elsewhere classified: Secondary | ICD-10-CM | POA: Insufficient documentation

## 2015-12-22 DIAGNOSIS — Z7982 Long term (current) use of aspirin: Secondary | ICD-10-CM | POA: Diagnosis not present

## 2015-12-22 DIAGNOSIS — F1010 Alcohol abuse, uncomplicated: Secondary | ICD-10-CM | POA: Insufficient documentation

## 2015-12-22 DIAGNOSIS — R531 Weakness: Secondary | ICD-10-CM | POA: Diagnosis not present

## 2015-12-22 DIAGNOSIS — R42 Dizziness and giddiness: Secondary | ICD-10-CM | POA: Insufficient documentation

## 2015-12-22 LAB — CBC WITH DIFFERENTIAL/PLATELET
BASOS PCT: 0 %
Basophils Absolute: 0 10*3/uL (ref 0.0–0.1)
EOS ABS: 0 10*3/uL (ref 0.0–0.7)
Eosinophils Relative: 0 %
HCT: 47.7 % (ref 39.0–52.0)
HEMOGLOBIN: 16 g/dL (ref 13.0–17.0)
Lymphocytes Relative: 12 %
Lymphs Abs: 0.8 10*3/uL (ref 0.7–4.0)
MCH: 30.2 pg (ref 26.0–34.0)
MCHC: 33.5 g/dL (ref 30.0–36.0)
MCV: 90.2 fL (ref 78.0–100.0)
MONOS PCT: 5 %
Monocytes Absolute: 0.4 10*3/uL (ref 0.1–1.0)
NEUTROS ABS: 5.7 10*3/uL (ref 1.7–7.7)
NEUTROS PCT: 83 %
Platelets: 216 10*3/uL (ref 150–400)
RBC: 5.29 MIL/uL (ref 4.22–5.81)
RDW: 14.3 % (ref 11.5–15.5)
WBC: 6.8 10*3/uL (ref 4.0–10.5)

## 2015-12-22 LAB — COMPREHENSIVE METABOLIC PANEL
ALT: 35 U/L (ref 17–63)
ANION GAP: 8 (ref 5–15)
AST: 32 U/L (ref 15–41)
Albumin: 3.7 g/dL (ref 3.5–5.0)
Alkaline Phosphatase: 60 U/L (ref 38–126)
BUN: 13 mg/dL (ref 6–20)
CHLORIDE: 105 mmol/L (ref 101–111)
CO2: 27 mmol/L (ref 22–32)
CREATININE: 1.14 mg/dL (ref 0.61–1.24)
Calcium: 9.4 mg/dL (ref 8.9–10.3)
GFR calc non Af Amer: >60 mL/min (ref >60)
Glucose, Bld: 136 mg/dL — ABNORMAL HIGH (ref 65–99)
Potassium: 4.2 mmol/L (ref 3.5–5.1)
SODIUM: 140 mmol/L (ref 135–145)
Total Bilirubin: 0.6 mg/dL (ref 0.3–1.2)
Total Protein: 6.7 g/dL (ref 6.5–8.1)

## 2015-12-22 LAB — URINALYSIS, ROUTINE W REFLEX MICROSCOPIC
BILIRUBIN URINE: NEGATIVE
Glucose, UA: NEGATIVE mg/dL
Hgb urine dipstick: NEGATIVE
Ketones, ur: 15 mg/dL — AB
Leukocytes, UA: NEGATIVE
NITRITE: NEGATIVE
Protein, ur: NEGATIVE mg/dL
SPECIFIC GRAVITY, URINE: 1.022 (ref 1.005–1.030)
pH: 7 (ref 5.0–8.0)

## 2015-12-22 LAB — CBG MONITORING, ED: GLUCOSE-CAPILLARY: 152 mg/dL — AB (ref 65–99)

## 2015-12-22 LAB — TROPONIN I: Troponin I: <0.03 ng/mL (ref <0.03)

## 2015-12-22 MED ORDER — DIAZEPAM 5 MG PO TABS: 5.0000 mg | ORAL_TABLET | Freq: Two times a day (BID) | ORAL | Status: DC

## 2015-12-22 MED ORDER — ONDANSETRON HCL 4 MG/2ML IJ SOLN
4.0000 mg | Freq: Once | INTRAMUSCULAR | Status: AC
  Administered 2015-12-22: 4 mg via INTRAVENOUS
  Filled 2015-12-22: qty 2

## 2015-12-22 MED ORDER — DIAZEPAM 5 MG/ML IJ SOLN
5.0000 mg | Freq: Once | INTRAMUSCULAR | Status: AC
Start: 2015-12-22 — End: 2015-12-22
  Administered 2015-12-22: 5 mg via INTRAVENOUS
  Filled 2015-12-22: qty 2

## 2015-12-22 MED ORDER — MECLIZINE HCL 12.5 MG PO TABS: 12.5000 mg | ORAL_TABLET | Freq: Three times a day (TID) | ORAL | Status: DC | PRN

## 2015-12-22 MED ORDER — ASPIRIN 81 MG PO CHEW
324.0000 mg | CHEWABLE_TABLET | Freq: Once | ORAL | Status: AC
  Administered 2015-12-22: 324 mg via ORAL
  Filled 2015-12-22: qty 4

## 2015-12-22 MED ORDER — MECLIZINE HCL 25 MG PO TABS
25.0000 mg | ORAL_TABLET | Freq: Once | ORAL | Status: AC
  Administered 2015-12-22: 25 mg via ORAL
  Filled 2015-12-22: qty 1

## 2015-12-22 MED ORDER — ONDANSETRON HCL 4 MG PO TABS: 4.0000 mg | ORAL_TABLET | Freq: Four times a day (QID) | ORAL | Status: DC

## 2015-12-22 MED ORDER — DIAZEPAM 5 MG/ML IJ SOLN
5.0000 mg | Freq: Once | INTRAMUSCULAR | Status: AC
  Administered 2015-12-22: 5 mg via INTRAVENOUS
  Filled 2015-12-22: qty 2

## 2015-12-22 NOTE — ED Notes (Signed)
Per EMS:  Pt woke up at 10am this morning experiencing severe dizziness.  Pt sts increased dizziness when sitting up.  Pt unable to stand up due to intense dizziness.  No other neuro deficits noted.

## 2015-12-22 NOTE — ED Provider Notes (Signed)
CSN: [id number]     Arrival date & time 12/22/15  1948 History   First MD Initiated Contact with Patient 12/22/15 1951     Chief Complaint  Patient presents with  . Dizziness   PT IS A 69 YO BM WITH A PMHX OF CAD, HTN, HYPERLIPIDEMIA, HIGH CHOLESTEROL WHO WOKE UP THIS MORNING AROUND 1000 WITH DIZZINESS.  THE PT SAID THAT HE GETS EXTREMELY DIZZY WITH SITTING UP.  THE PT SAID HE HAD TO CALL EMS BECAUSE HE COULD NOT SIT UP IN A CAR.  PT DENIES ANY OTHER NEUROLOGIC SX.  PT DID HAVE A CAROTID A. [place] 2 DAYS AGO FOR SURVEILLANCE AND WAS TOLD THAT IT WAS OK.  (Consider location/radiation/quality/duration/timing/severity/associated sxs/prior Treatment) The history is provided by the patient.    Past Medical History  Diagnosis Date  . GLUCOSE INTOLERANCE 09/16/2007  . HYPERCHOLESTEROLEMIA 03/24/2007  . HYPERLIPIDEMIA 09/16/2007  . OBESITY 03/24/2007  . ANXIETY 03/28/2007  . ERECTILE DYSFUNCTION 03/28/2007  . DEPRESSION 03/28/2007  . OTITIS MEDIA, LEFT 06/10/2009  . HYPERTENSION 03/28/2007  . CAROTID ARTERY STENOSIS, BILATERAL 07/01/2009  . PVD 06/25/2009  . HEMORRHOIDS, INTERNAL 03/24/2007  . URI 08/16/2009  . ALLERGIC RHINITIS 09/16/2007  . ESOPHAGITIS 03/24/2007  . ESOPHAGEAL STRICTURE 09/16/2007  . GERD 03/24/2007  . LOW BACK PAIN 03/28/2007  . Dizziness and giddiness 06/08/2009  . RASH-NONVESICULAR 06/17/2009  . CEPHALGIA 06/08/2009  . DYSPNEA/SHORTNESS OF BREATH 09/16/2007  . CHEST PAIN 09/16/2007  . MAGNETIC RESONANCE IMAGING, BRAIN, ABNORMAL 06/26/2009  . ALLERGY 03/24/2007  . COLONIC POLYPS, HX OF 09/16/2007  . NECK PAIN, LEFT 06/20/2010  . Palpitations 06/20/2010  . Impaired glucose tolerance 10/04/2010  . 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 Tonsillectomy    . S/p right knee arthroscopy     Family History  Problem Relation Age of Onset  . Lymphoma Mother   . Heart disease Father     CHF  . Ovarian cancer Sister   . Lung cancer Sister   .  Coronary artery disease Sister     CABG  . Coronary artery disease Brother     stent, and CAD  . Colon cancer Neg Hx   . Esophageal cancer Neg Hx   . Rectal cancer Neg Hx   . Stomach cancer Neg Hx    Social History  Substance Use Topics  . Smoking status: Former Research scientist (life sciences)  . Smokeless tobacco: Never Used  . Alcohol Use: 4.2 oz/week    7 Cans of beer per week     Comment: beer most days    Review of Systems  Neurological: Positive for dizziness.  All other systems reviewed and are negative.     Allergies  Cefuroxime axetil  Home Medications   Prior to Admission medications   Medication Sig Start Date End Date Taking? Authorizing Provider  aspirin (ECOTRIN LOW STRENGTH) 81 MG EC tablet Take 81 mg by mouth daily.     Yes Historical Provider, MD  diclofenac (VOLTAREN) 75 MG EC tablet Take 1 tablet (75 mg total) by mouth as needed. 12/14/14  Yes Biagio Borg, MD  lisinopril (PRINIVIL,ZESTRIL) 10 MG tablet TAKE ONE TABLET BY MOUTH ONCE DAILY 11/20/15  Yes Biagio Borg, MD  metoprolol (LOPRESSOR) 50 MG tablet Take 1 tablet (50 mg total) by mouth daily. 11/21/15  Yes Biagio Borg, MD  Omega-3 Fatty Acids (FISH OIL) 1000 MG CAPS Take 1 capsule by mouth daily.   Yes Historical  Provider, MD  omeprazole (PRILOSEC) 20 MG capsule TAKE ONE CAPSULE BY MOUTH TWICE DAILY 11/26/15  Yes Biagio Borg, MD  rosuvastatin (CRESTOR) 40 MG tablet TAKE ONE TABLET BY MOUTH ONCE DAILY 04/08/15  Yes Biagio Borg, MD  sildenafil (VIAGRA) 100 MG tablet Take 1 tablet (100 mg total) by mouth as needed for erectile dysfunction. 12/20/12  Yes Biagio Borg, MD  terazosin (HYTRIN) 5 MG capsule Take 1 capsule by mouth daily. 03/22/15  Yes Historical Provider, MD  triamcinolone (NASACORT AQ) 55 MCG/ACT AERO nasal inhaler Place 2 sprays into the nose daily. 10/24/15  Yes Biagio Borg, MD  ANDROGEL PUMP 20.25 MG/ACT (1.62%) GEL APPLY 2 PUMPS ONTO SKIN DAILY Patient not taking: Reported on 12/22/2015 11/15/13   Biagio Borg, MD   cetirizine (ZYRTEC) 10 MG tablet Take 1 tablet (10 mg total) by mouth daily. Patient not taking: Reported on 12/22/2015 10/24/15   Biagio Borg, MD  cyclobenzaprine (FLEXERIL) 5 MG tablet Take 1 tablet (5 mg total) by mouth 3 (three) times daily as needed for muscle spasms. Patient not taking: Reported on 12/22/2015 02/07/15   Biagio Borg, MD  fluticasone [facility]) 50 MCG/ACT nasal spray USE TWO SPRAY(S) IN [facility] NOSTRIL ONCE DAILY Patient not taking: Reported on 12/22/2015 11/26/15   Biagio Borg, MD  meclizine (ANTIVERT) 12.5 MG tablet Take 1-2 tablets (12.5-25 mg total) by mouth 3 (three) times daily as needed for dizziness. 12/24/15   Reyne Dumas, MD  thiamine 100 MG tablet Take 1 tablet (100 mg total) by mouth daily. 12/24/15   Reyne Dumas, MD   BP 143/88 mmHg  Pulse 81  Temp(Src) 98.4 F (36.9 C) (Oral)  Resp 20  Ht 5' 9.5" (1.765 m)  Wt 242 lb 4.8 oz (109.907 kg)  BMI 35.28 kg/m2  SpO2 96% Physical Exam  Constitutional: He is oriented to person, place, and time. He appears well-developed and well-nourished.  HENT:  Head: Normocephalic and atraumatic.  Right Ear: External ear normal.  Left Ear: External ear normal.  Nose: Nose normal.  Mouth/Throat: Oropharynx is clear and moist.  Eyes: Conjunctivae and EOM are normal. Pupils are equal, round, and reactive to light.  Neck: Normal range of motion. Neck supple.  Cardiovascular: Normal rate, regular rhythm, normal heart sounds and intact distal pulses.   Pulmonary/Chest: Effort normal and breath sounds normal.  Abdominal: Soft. Bowel sounds are normal.  Musculoskeletal: Normal range of motion.  Neurological: He is alert and oriented to person, place, and time.  Skin: Skin is warm and dry.  Psychiatric: He has a normal mood and affect. His behavior is normal. Judgment and thought content normal.  Nursing note and vitals reviewed.   ED Course  Procedures (including critical care time) Labs Review Labs Reviewed  URINALYSIS, ROUTINE W  REFLEX MICROSCOPIC (NOT AT [facility]) - Abnormal; Notable for the following:    Ketones, ur 15 (*)    All other components within normal limits  COMPREHENSIVE METABOLIC PANEL - Abnormal; Notable for the following:    Glucose, Bld 136 (*)    All other components within normal limits  HEMOGLOBIN A1C - Abnormal; Notable for the following:    Hgb A1c MFr Bld 6.1 (*)    All other components within normal limits  COMPREHENSIVE METABOLIC PANEL - Abnormal; Notable for the following:    Potassium 3.0 (*)    Glucose, Bld 168 (*)    Total Protein 6.1 (*)    Albumin 3.3 (*)  Anion gap 19 (*)    All other components within normal limits  COMPREHENSIVE METABOLIC PANEL - Abnormal; Notable for the following:    Calcium 8.8 (*)    Total Protein 6.0 (*)    Albumin 3.1 (*)    All other components within normal limits  CBG MONITORING, ED - Abnormal; Notable for the following:    Glucose-Capillary 152 (*)    All other components within normal limits  CBC WITH DIFFERENTIAL/PLATELET  TROPONIN I  LIPID PANEL  CBC  CBC    Imaging Review No results found. I have personally reviewed and evaluated these images and lab results as part of my medical decision-making.   EKG Interpretation   Date/Time:  Sunday December 22 2015 20:03:11 EDT Ventricular Rate:  77 PR Interval:    QRS Duration: 79 QT Interval:  426 QTC Calculation: 483 R Axis:   69 Text Interpretation:  Sinus rhythm Borderline prolonged QT interval QTC  SIMILAR TO PRIOR No significant change since last tracing Confirmed by  Michele Kerlin MD, Kehinde Totzke ([id number]) on 12/22/2015 8:08:36 PM      MDM  PT IS IMPROVED FROM HIS INITIAL PRESENTATION, BUT HE IS UNABLE TO SIT UP FULLY OR TO STAND UP AT ALL.  HE GETS VERY DIZZY WITH ANY MOVEMENT.   PT D/W DR. Hal Hope ([facility]) FOR ADMISSION.  Final diagnoses:  Vertigo       Isla Pence, MD 12/25/15 1620

## 2015-12-22 NOTE — ED Notes (Signed)
Ortho stats were only done lying and sitting because patient stated he was too dizzy to stand.

## 2015-12-22 NOTE — Discharge Instructions (Signed)
Benign Positional Vertigo Vertigo is the feeling that you or your surroundings are moving when they are not. Benign positional vertigo is the most common form of vertigo. The cause of this condition is not serious (is benign). This condition is triggered by certain movements and positions (is positional). This condition can be dangerous if it occurs while you are doing something that could endanger you or others, such as driving.  CAUSES In many cases, the cause of this condition is not known. It may be caused by a disturbance in an area of the inner ear that helps your brain to sense movement and balance. This disturbance can be caused by a viral infection (labyrinthitis), head injury, or repetitive motion. RISK FACTORS This condition is more likely to develop in:  Women.  People who are 50 years of age or older. SYMPTOMS Symptoms of this condition usually happen when you move your head or your eyes in different directions. Symptoms may start suddenly, and they usually last for less than a minute. Symptoms may include:  Loss of balance and falling.  Feeling like you are spinning or moving.  Feeling like your surroundings are spinning or moving.  Nausea and vomiting.  Blurred vision.  Dizziness.  Involuntary eye movement (nystagmus). Symptoms can be mild and cause only slight annoyance, or they can be severe and interfere with daily life. Episodes of benign positional vertigo may return (recur) over time, and they may be triggered by certain movements. Symptoms may improve over time. DIAGNOSIS This condition is usually diagnosed by medical history and a physical exam of the head, neck, and ears. You may be referred to a health care provider who specializes in ear, nose, and throat (ENT) problems (otolaryngologist) or a provider who specializes in disorders of the nervous system (neurologist). You may have additional testing, including:  MRI.  A CT scan.  Eye movement tests. Your  health care provider may ask you to change positions quickly while he or she watches you for symptoms of benign positional vertigo, such as nystagmus. Eye movement may be tested with an electronystagmogram (ENG), caloric stimulation, the Dix-Hallpike test, or the roll test.  An electroencephalogram (EEG). This records electrical activity in your brain.  Hearing tests. TREATMENT Usually, your health care provider will treat this by moving your head in specific positions to adjust your inner ear back to normal. Surgery may be needed in severe cases, but this is rare. In some cases, benign positional vertigo may resolve on its own in 2-4 weeks. HOME CARE INSTRUCTIONS Safety  Move slowly.Avoid sudden body or head movements.  Avoid driving.  Avoid operating heavy machinery.  Avoid doing any tasks that would be dangerous to you or others if a vertigo episode would occur.  If you have trouble walking or keeping your balance, try using a cane for stability. If you feel dizzy or unstable, sit down right away.  Return to your normal activities as told by your health care provider. Ask your health care provider what activities are safe for you. General Instructions  Take over-the-counter and prescription medicines only as told by your health care provider.  Avoid certain positions or movements as told by your health care provider.  Drink enough fluid to keep your urine clear or pale yellow.  Keep all follow-up visits as told by your health care provider. This is important. SEEK MEDICAL CARE IF:  You have a fever.  Your condition gets worse or you develop new symptoms.  Your family or friends   notice any behavioral changes.  Your nausea or vomiting gets worse.  You have numbness or a "pins and needles" sensation. SEEK IMMEDIATE MEDICAL CARE IF:  You have difficulty speaking or moving.  You are always dizzy.  You faint.  You develop severe headaches.  You have weakness in your  legs or arms.  You have changes in your hearing or vision.  You develop a stiff neck.  You develop sensitivity to light.   This information is not intended to replace advice given to you by your health care provider. Make sure you discuss any questions you have with your health care provider.   Document Released: 03/16/2006 Document Revised: 02/27/2015 Document Reviewed: 10/01/2014 Elsevier Interactive Patient Education 2016 [employer]  

## 2015-12-22 NOTE — ED Notes (Signed)
Pt provided with urinal

## 2015-12-22 NOTE — ED Notes (Signed)
Patient was too dizzy to perform ortho-stats

## 2015-12-22 NOTE — H&P (Signed)
History and Physical    Warren Weeks [id number] DOB: 1946-05-17 DOA: 12/22/2015  PCP: Cathlean Cower, MD  Patient coming from: Home.  Chief Complaint: Dizziness.  HPI: Warren Weeks is a 70 y.o. male with hypertension, hyperlipidemia, BPH and alcohol abuse who is being followed by cardiologist for carotid stenosis has had recent vascular study for carotids 2 days ago presents to the ER because of persistent dizziness since patient woke up yesterday morning. Denies any weakness of the upper or lower extremities or any loss of consciousness. Denies any blurred vision difficulty speaking or swallowing. Patient's dizziness is on sitting up or trying to stand up. On my exam patient feels intensely dizzy on minimal movements and sitting. Patient also becomes tachycardic. Unable to do orthostatic at this point. Otherwise patient appears nonfocal. Denies any new changes in medications. Denies nausea vomiting headache. Patient has been admitted for further workup for dizziness.   ED Course: MRI of the brain has been ordered which is pending.  Review of Systems: As per HPI, rest all negative.   Past Medical History  Diagnosis Date  . GLUCOSE INTOLERANCE 09/16/2007  . HYPERCHOLESTEROLEMIA 03/24/2007  . HYPERLIPIDEMIA 09/16/2007  . OBESITY 03/24/2007  . ANXIETY 03/28/2007  . ERECTILE DYSFUNCTION 03/28/2007  . DEPRESSION 03/28/2007  . OTITIS MEDIA, LEFT 06/10/2009  . HYPERTENSION 03/28/2007  . CAROTID ARTERY STENOSIS, BILATERAL 07/01/2009  . PVD 06/25/2009  . HEMORRHOIDS, INTERNAL 03/24/2007  . URI 08/16/2009  . ALLERGIC RHINITIS 09/16/2007  . ESOPHAGITIS 03/24/2007  . ESOPHAGEAL STRICTURE 09/16/2007  . GERD 03/24/2007  . LOW BACK PAIN 03/28/2007  . Dizziness and giddiness 06/08/2009  . RASH-NONVESICULAR 06/17/2009  . CEPHALGIA 06/08/2009  . DYSPNEA/SHORTNESS OF BREATH 09/16/2007  . CHEST PAIN 09/16/2007  . MAGNETIC RESONANCE IMAGING, BRAIN, ABNORMAL 06/26/2009  . ALLERGY 03/24/2007  . COLONIC POLYPS, HX OF  09/16/2007  . NECK PAIN, LEFT 06/20/2010  . Palpitations 06/20/2010  . Impaired glucose tolerance 10/04/2010  . 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 Tonsillectomy    . S/p right knee arthroscopy       reports that he has quit smoking. He has never used smokeless tobacco. He reports that he drinks about 4.2 oz of alcohol per week. He reports that he does not use illicit drugs.  Allergies  Allergen Reactions  . Cefuroxime Axetil     Rash     Family History  Problem Relation Age of Onset  . Lymphoma Mother   . Heart disease Father     CHF  . Ovarian cancer Sister   . Lung cancer Sister   . Coronary artery disease Sister     CABG  . Coronary artery disease Brother     stent, and CAD  . Colon cancer Neg Hx   . Esophageal cancer Neg Hx   . Rectal cancer Neg Hx   . Stomach cancer Neg Hx     Prior to Admission medications   Medication Sig Start Date End Date Taking? Authorizing Provider  aspirin (ECOTRIN LOW STRENGTH) 81 MG EC tablet Take 81 mg by mouth daily.     Yes Historical Provider, MD  diclofenac (VOLTAREN) 75 MG EC tablet Take 1 tablet (75 mg total) by mouth as needed. 12/14/14  Yes Biagio Borg, MD  hydrochlorothiazide (HYDRODIURIL) 25 MG tablet TAKE ONE TABLET BY MOUTH ONCE DAILY. 12/20/14  Yes Biagio Borg, MD  lisinopril (PRINIVIL,ZESTRIL) 10 MG tablet TAKE ONE TABLET BY  MOUTH ONCE DAILY 11/20/15  Yes Biagio Borg, MD  metoprolol (LOPRESSOR) 50 MG tablet Take 1 tablet (50 mg total) by mouth daily. 11/21/15  Yes Biagio Borg, MD  Omega-3 Fatty Acids (FISH OIL) 1000 MG CAPS Take 1 capsule by mouth daily.   Yes Historical Provider, MD  omeprazole (PRILOSEC) 20 MG capsule TAKE ONE CAPSULE BY MOUTH TWICE DAILY 11/26/15  Yes Biagio Borg, MD  rosuvastatin (CRESTOR) 40 MG tablet TAKE ONE TABLET BY MOUTH ONCE DAILY 04/08/15  Yes Biagio Borg, MD  sildenafil (VIAGRA) 100 MG tablet Take 1 tablet (100 mg total) by mouth as needed for  erectile dysfunction. 12/20/12  Yes Biagio Borg, MD  terazosin (HYTRIN) 5 MG capsule Take 1 capsule by mouth daily. 03/22/15  Yes Historical Provider, MD  triamcinolone (NASACORT AQ) 55 MCG/ACT AERO nasal inhaler Place 2 sprays into the nose daily. 10/24/15  Yes Biagio Borg, MD  ANDROGEL PUMP 20.25 MG/ACT (1.62%) GEL APPLY 2 PUMPS ONTO SKIN DAILY Patient not taking: Reported on 12/22/2015 11/15/13   Biagio Borg, MD  cetirizine (ZYRTEC) 10 MG tablet Take 1 tablet (10 mg total) by mouth daily. Patient not taking: Reported on 12/22/2015 10/24/15   Biagio Borg, MD  cyclobenzaprine (FLEXERIL) 5 MG tablet Take 1 tablet (5 mg total) by mouth 3 (three) times daily as needed for muscle spasms. Patient not taking: Reported on 12/22/2015 02/07/15   Biagio Borg, MD  fluticasone [facility]) 50 MCG/ACT nasal spray USE TWO SPRAY(S) IN [facility] NOSTRIL ONCE DAILY Patient not taking: Reported on 12/22/2015 11/26/15   Biagio Borg, MD    Physical Exam: Filed Vitals:   12/22/15 2228 12/22/15 2230 12/22/15 2245 12/22/15 2300  BP: 127/77 144/80 148/90 152/99  Pulse: 111 77 69 73  Resp: 20 14 13 10   SpO2: 97% 94% 96% 97%      Constitutional: Not in distress. Filed Vitals:   12/22/15 2228 12/22/15 2230 12/22/15 2245 12/22/15 2300  BP: 127/77 144/80 148/90 152/99  Pulse: 111 77 69 73  Resp: 20 14 13 10   SpO2: 97% 94% 96% 97%   Eyes: No nystagmus PERRLA positive. ENMT: No discharge from the ears eyes nose and mouth. Neck: No JVD appreciated no mass felt. No carotid bruit. Respiratory: No rhonchi or crepitations. Cardiovascular: S1-S2 heard. Abdomen: Soft nontender bowel sounds present. Musculoskeletal: No edema. Skin: No rash. Neurologic: Alert awake oriented to time place and person. Moves all extremities 5 x 5. No facial asymmetry time is midline. PERRLA positive. No nystagmus. Psychiatric: Appears normal.   Labs on Admission: I have personally reviewed following labs and imaging studies  CBC:  Recent Labs Lab  12/22/15 2008  WBC 6.8  NEUTROABS 5.7  HGB 16.0  HCT 47.7  MCV 90.2  PLT 123XX123   Basic Metabolic Panel:  Recent Labs Lab 12/22/15 2145  NA 140  K 4.2  CL 105  CO2 27  GLUCOSE 136*  BUN 13  CREATININE 1.14  CALCIUM 9.4   GFR: CrCl cannot be calculated (Unknown ideal weight.). Liver Function Tests:  Recent Labs Lab 12/22/15 2145  AST 32  ALT 35  ALKPHOS 60  BILITOT 0.6  PROT 6.7  ALBUMIN 3.7   No results for input(s): LIPASE, AMYLASE in the last 168 hours. No results for input(s): AMMONIA in the last 168 hours. Coagulation Profile: No results for input(s): INR, PROTIME in the last 168 hours. Cardiac Enzymes:  Recent Labs Lab 12/22/15 2145  TROPONINI <0.03  BNP (last 3 results) No results for input(s): PROBNP in the last 8760 hours. HbA1C: No results for input(s): HGBA1C in the last 72 hours. CBG:  Recent Labs Lab 12/22/15 2004  GLUCAP 152*   Lipid Profile: No results for input(s): CHOL, HDL, LDLCALC, TRIG, CHOLHDL, LDLDIRECT in the last 72 hours. Thyroid Function Tests: No results for input(s): TSH, T4TOTAL, FREET4, T3FREE, THYROIDAB in the last 72 hours. Anemia Panel: No results for input(s): VITAMINB12, FOLATE, FERRITIN, TIBC, IRON, RETICCTPCT in the last 72 hours. Urine analysis:    Component Value Date/Time   COLORURINE YELLOW 12/22/2015 2100   APPEARANCEUR CLEAR 12/22/2015 2100   LABSPEC 1.022 12/22/2015 2100   PHURINE 7.0 12/22/2015 2100   GLUCOSEU NEGATIVE 12/22/2015 2100   GLUCOSEU NEGATIVE 10/24/2015 1538   HGBUR NEGATIVE 12/22/2015 2100   BILIRUBINUR NEGATIVE 12/22/2015 2100   KETONESUR 15* 12/22/2015 2100   PROTEINUR NEGATIVE 12/22/2015 2100   UROBILINOGEN 0.2 10/24/2015 1538   NITRITE NEGATIVE 12/22/2015 2100   LEUKOCYTESUR NEGATIVE 12/22/2015 2100   Sepsis Labs: @LABRCNTIP (procalcitonin:4,lacticidven:4) )No results found for this or any previous visit (from the past 240 hour(s)).   Radiological Exams on Admission: Dg  Chest 2 View  12/22/2015  CLINICAL DATA:  Severe dizziness, onset this morning. EXAM: CHEST  2 VIEW COMPARISON:  01/18/2013 FINDINGS: The heart size and mediastinal contours are within normal limits. Both lungs are clear. The visualized skeletal structures are unremarkable. IMPRESSION: Normal exam. Electronically Signed   By: Lorriane Shire M.D.   On: 12/22/2015 20:29   Ct Head Wo Contrast  12/22/2015  CLINICAL DATA:  Acute onset of severe dizziness.  Initial encounter. EXAM: CT HEAD WITHOUT CONTRAST TECHNIQUE: Contiguous axial images were obtained from the base of the skull through the vertex without intravenous contrast. COMPARISON:  MRI of the brain performed 06/24/2009 FINDINGS: There is no evidence of acute infarction, mass lesion, or intra- or extra-axial hemorrhage on CT. Mild periventricular white matter change likely reflects small vessel ischemic microangiopathy. The posterior fossa, including the cerebellum, brainstem and fourth ventricle, is within normal limits. The third and lateral ventricles, and basal ganglia are unremarkable in appearance. The cerebral hemispheres are symmetric in appearance, with normal gray-white differentiation. No mass effect or midline shift is seen. There is no evidence of fracture; visualized osseous structures are unremarkable in appearance. The visualized portions of the orbits are within normal limits. The paranasal sinuses and mastoid air cells are well-aerated. No significant soft tissue abnormalities are seen. IMPRESSION: 1. No acute intracranial pathology seen on CT. 2. Mild small vessel ischemic microangiopathy. Electronically Signed   By: Garald Balding M.D.   On: 12/22/2015 21:12    EKG: Independently reviewed. Normal sinus rhythm.  Assessment/Plan Principal Problem:   Dizziness Active Problems:   Hyperlipidemia   Essential hypertension    1. Dizziness/vertigo - MRI brain is pending. If negative for stroke will treat as benign positional vertigo with  meclizine or benzodiazepine and Physical therapy consult. For now aspirin. 2. Hypertension - continue metoprolol and lisinopril. We'll hold off hydrochlorothiazide and gently hydrate due to dizziness. 3. Hyperlipidemia on Crestor. 4. Alcohol abuse - patient is placed on CIWA protocol, thiamine and Ativan.   DVT prophylaxis: Lovenox. Code Status: Full code.  Family Communication: Family at the bedside.  Disposition Plan: Home.  Consults called: Physical therapy.  Admission status: Observation telemetry.    Rise Patience MD [facility] Pager [phone].  If 7PM-7AM, please contact night-coverage [email or web address] Password [facility]  12/22/2015, 11:31 PM

## 2015-12-22 NOTE — ED Notes (Signed)
Attempted to ambulate patient.  Patient was unable to tolerate a sitting position without dizziness.

## 2015-12-23 DIAGNOSIS — R42 Dizziness and giddiness: Secondary | ICD-10-CM

## 2015-12-23 DIAGNOSIS — E785 Hyperlipidemia, unspecified: Secondary | ICD-10-CM

## 2015-12-23 DIAGNOSIS — I10 Essential (primary) hypertension: Secondary | ICD-10-CM | POA: Diagnosis not present

## 2015-12-23 LAB — COMPREHENSIVE METABOLIC PANEL
ALBUMIN: 3.3 g/dL — AB (ref 3.5–5.0)
ALK PHOS: 52 U/L (ref 38–126)
ALT: 29 U/L (ref 17–63)
ANION GAP: 19 — AB (ref 5–15)
AST: 25 U/L (ref 15–41)
BUN: 12 mg/dL (ref 6–20)
CALCIUM: 9.9 mg/dL (ref 8.9–10.3)
CO2: 24 mmol/L (ref 22–32)
CREATININE: 1.04 mg/dL (ref 0.61–1.24)
Chloride: 101 mmol/L (ref 101–111)
GFR calc Af Amer: >60 mL/min (ref >60)
GFR calc non Af Amer: >60 mL/min (ref >60)
GLUCOSE: 168 mg/dL — AB (ref 65–99)
Potassium: 3 mmol/L — ABNORMAL LOW (ref 3.5–5.1)
SODIUM: 144 mmol/L (ref 135–145)
Total Bilirubin: 0.7 mg/dL (ref 0.3–1.2)
Total Protein: 6.1 g/dL — ABNORMAL LOW (ref 6.5–8.1)

## 2015-12-23 LAB — LIPID PANEL
CHOLESTEROL: 146 mg/dL (ref 0–200)
HDL: 54 mg/dL (ref >40)
LDL Cholesterol: 76 mg/dL (ref 0–99)
Total CHOL/HDL Ratio: 2.7 RATIO
Triglycerides: 79 mg/dL (ref <150)
VLDL: 16 mg/dL (ref 0–40)

## 2015-12-23 LAB — CBC
HCT: 43.4 % (ref 39.0–52.0)
HEMOGLOBIN: 14 g/dL (ref 13.0–17.0)
MCH: 29.7 pg (ref 26.0–34.0)
MCHC: 32.3 g/dL (ref 30.0–36.0)
MCV: 91.9 fL (ref 78.0–100.0)
Platelets: 172 10*3/uL (ref 150–400)
RBC: 4.72 MIL/uL (ref 4.22–5.81)
RDW: 14.4 % (ref 11.5–15.5)
WBC: 5.6 10*3/uL (ref 4.0–10.5)

## 2015-12-23 MED ORDER — ROSUVASTATIN CALCIUM 40 MG PO TABS
40.0000 mg | ORAL_TABLET | Freq: Every day | ORAL | Status: DC
  Administered 2015-12-23: 40 mg via ORAL
  Filled 2015-12-23 (×2): qty 1

## 2015-12-23 MED ORDER — OMEGA-3-ACID ETHYL ESTERS 1 G PO CAPS
1.0000 g | ORAL_CAPSULE | Freq: Every day | ORAL | Status: DC
  Administered 2015-12-23 – 2015-12-24 (×2): 1 g via ORAL
  Filled 2015-12-23 (×2): qty 1

## 2015-12-23 MED ORDER — FOLIC ACID 1 MG PO TABS
1.0000 mg | ORAL_TABLET | Freq: Every day | ORAL | Status: DC
  Administered 2015-12-23 – 2015-12-24 (×2): 1 mg via ORAL
  Filled 2015-12-23 (×2): qty 1

## 2015-12-23 MED ORDER — SODIUM CHLORIDE 0.9 % IV SOLN
INTRAVENOUS | Status: AC
  Administered 2015-12-23 (×2): via INTRAVENOUS

## 2015-12-23 MED ORDER — SALINE SPRAY 0.65 % NA SOLN
1.0000 | NASAL | Status: DC | PRN
  Administered 2015-12-23: 1 via NASAL
  Filled 2015-12-23: qty 44

## 2015-12-23 MED ORDER — LISINOPRIL 10 MG PO TABS
10.0000 mg | ORAL_TABLET | Freq: Every day | ORAL | Status: DC
  Administered 2015-12-23 – 2015-12-24 (×2): 10 mg via ORAL
  Filled 2015-12-23 (×2): qty 1

## 2015-12-23 MED ORDER — ADULT MULTIVITAMIN W/MINERALS CH
1.0000 | ORAL_TABLET | Freq: Every day | ORAL | Status: DC
  Administered 2015-12-23 – 2015-12-24 (×2): 1 via ORAL
  Filled 2015-12-23 (×2): qty 1

## 2015-12-23 MED ORDER — ASPIRIN 300 MG RE SUPP: 300.0000 mg | Freq: Every day | RECTAL | Status: DC

## 2015-12-23 MED ORDER — LORAZEPAM 1 MG PO TABS: 0.0000 mg | ORAL_TABLET | Freq: Two times a day (BID) | ORAL | Status: DC

## 2015-12-23 MED ORDER — MECLIZINE HCL 12.5 MG PO TABS: 12.5000 mg | ORAL_TABLET | Freq: Three times a day (TID) | ORAL | Status: DC

## 2015-12-23 MED ORDER — ASPIRIN 325 MG PO TABS
325.0000 mg | ORAL_TABLET | Freq: Every day | ORAL | Status: DC
  Administered 2015-12-23 – 2015-12-24 (×2): 325 mg via ORAL
  Filled 2015-12-23 (×2): qty 1

## 2015-12-23 MED ORDER — PANTOPRAZOLE SODIUM 40 MG PO TBEC
40.0000 mg | DELAYED_RELEASE_TABLET | Freq: Every day | ORAL | Status: DC
  Administered 2015-12-23 – 2015-12-24 (×2): 40 mg via ORAL
  Filled 2015-12-23 (×2): qty 1

## 2015-12-23 MED ORDER — ENOXAPARIN SODIUM 40 MG/0.4ML ~~LOC~~ SOLN
40.0000 mg | SUBCUTANEOUS | Status: DC
  Administered 2015-12-23 – 2015-12-24 (×2): 40 mg via SUBCUTANEOUS
  Filled 2015-12-23 (×2): qty 0.4

## 2015-12-23 MED ORDER — LORAZEPAM 1 MG PO TABS: 0.0000 mg | ORAL_TABLET | Freq: Four times a day (QID) | ORAL | Status: DC

## 2015-12-23 MED ORDER — TERAZOSIN HCL 5 MG PO CAPS
5.0000 mg | ORAL_CAPSULE | Freq: Every day | ORAL | Status: DC
  Administered 2015-12-23 – 2015-12-24 (×2): 5 mg via ORAL
  Filled 2015-12-23 (×2): qty 1

## 2015-12-23 MED ORDER — LORAZEPAM 1 MG PO TABS: 1.0000 mg | ORAL_TABLET | Freq: Four times a day (QID) | ORAL | Status: DC | PRN

## 2015-12-23 MED ORDER — POTASSIUM CHLORIDE CRYS ER 20 MEQ PO TBCR
60.0000 meq | EXTENDED_RELEASE_TABLET | Freq: Once | ORAL | Status: AC
  Administered 2015-12-23: 60 meq via ORAL
  Filled 2015-12-23: qty 3

## 2015-12-23 MED ORDER — VITAMIN B-1 100 MG PO TABS
100.0000 mg | ORAL_TABLET | Freq: Every day | ORAL | Status: DC
  Administered 2015-12-23 – 2015-12-24 (×2): 100 mg via ORAL
  Filled 2015-12-23 (×2): qty 1

## 2015-12-23 MED ORDER — LORAZEPAM 2 MG/ML IJ SOLN: 1.0000 mg | Freq: Four times a day (QID) | INTRAMUSCULAR | Status: DC | PRN

## 2015-12-23 MED ORDER — METOPROLOL TARTRATE 50 MG PO TABS
50.0000 mg | ORAL_TABLET | Freq: Every day | ORAL | Status: DC
  Administered 2015-12-23 – 2015-12-24 (×2): 50 mg via ORAL
  Filled 2015-12-23 (×2): qty 1

## 2015-12-23 MED ORDER — THIAMINE HCL 100 MG/ML IJ SOLN
100.0000 mg | Freq: Every day | INTRAMUSCULAR | Status: DC
Start: 2015-12-23 — End: 2015-12-24
  Filled 2015-12-23: qty 2

## 2015-12-23 MED ORDER — TRIAMCINOLONE ACETONIDE 55 MCG/ACT NA AERO
2.0000 | INHALATION_SPRAY | Freq: Every day | NASAL | Status: DC
  Administered 2015-12-23: 2 via NASAL
  Filled 2015-12-23 (×2): qty 21.6

## 2015-12-23 NOTE — Evaluation (Signed)
Physical Therapy Evaluation Patient Details Name: Warren Weeks MRN: [id number] DOB: 1946-03-20 Today's Date: 12/23/2015   History of Present Illness  Patient is a 70 y/o male with hx of hypertension, hyperlipidemia, BPH and alcohol abuse presents with dizziness. Head CT, MRI- unremarkable. 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  Clinical Impression  Patient presents with generalized weakness and dizziness impacting safe mobility. Pt with difficulty mobilizing due to intensity of dizziness most notably when changing positions-especially with rolling left and coming up to seated position. Pt with + Dix hallpike to the left and concordant dizzy symptoms with vertical head movements. Concern for possible BPPV or an underlying hypofunction. Will need further assessment. Education on vestibular system, gaze stabilization and safety during mobility when dizzy symptoms are present. Pt with negative orthostatics. See below.  Supine BP 145/78 Sitting BP 162/94 Standing BP 162/97 Pt has support from wife at home. Very high fall risk at this time. Would benefit from further vestibular assessment and follow up at outpatient for vestibular at discharge. Will follow acutely to maximize independence and mobility prior to return home.    Follow Up Recommendations Outpatient PT (vestibular outpatient PT)    Equipment Recommendations  Rolling walker with 5" wheels    Recommendations for Other Services       Precautions / Restrictions Precautions Precautions: Fall Precaution Comments: dizziness Restrictions Weight Bearing Restrictions: No      Mobility  Bed Mobility Overal bed mobility: Needs Assistance Bed Mobility: Rolling;Sidelying to Sit;Sit to Sidelying Rolling: Min guard Sidelying to sit: Min assist     Sit to sidelying: Min guard General bed mobility comments: Rolling to left to get to EOB, increased time due to dizziness, cues for gaze  stabilization. Difficult to view nystagmus. Resolved within a few minutes.   Transfers Overall transfer level: Needs assistance Equipment used: Rolling walker (2 wheeled) Transfers: Sit to/from Stand Sit to Stand: Min assist         General transfer comment: MIn A to boost from EOB, increased time with cues for gaze stabilzation. Stood from [employer]. No dizziness.  Ambulation/Gait Ambulation/Gait assistance: Min assist Ambulation Distance (Feet): 100 Feet Assistive device: Rolling walker (2 wheeled) Gait Pattern/deviations: Step-through pattern;Decreased stride length Gait velocity: decreased Gait velocity interpretation: Below normal speed for age/gender General Gait Details: Very slow, guarded gait with cues for gaze stabilization esp during turns- cues for eyes, head and then body to decrease sensationn of dizziness. Cues for RW management.  Stairs            Wheelchair Mobility    Modified Rankin (Stroke Patients Only) Modified Rankin (Stroke Patients Only) Pre-Morbid Rankin Score: No symptoms Modified Rankin: Moderately severe disability     Balance Overall balance assessment: Needs assistance Sitting-balance support: Feet supported;Bilateral upper extremity supported Sitting balance-Leahy Scale: Fair Sitting balance - Comments: Initially, visible sway and Min A needed to maintain sitting balance due to dizziness. Cues for gaze stabilization and able to sit EOB without assist.    Standing balance support: During functional activity;Bilateral upper extremity supported Standing balance-Leahy Scale: Poor Standing balance comment: Reliant on BUEs and RW for support in standing. Able to stand and urinate in bathroom without assist.                              Pertinent Vitals/Pain Pain Assessment: Faces (minor headache during session) Faces Pain Scale: Hurts a little  bit Pain Location: head Pain Descriptors / Indicators: Headache Pain Intervention(s):  Monitored during session    Home Living Family/patient expects to be discharged to:: Private residence Living Arrangements: Spouse/significant other Available Help at Discharge: Family;Available 24 hours/day Type of Home: House Home Access: Level entry     Home Layout: Multi-level Home Equipment: Cane - single point      Prior Function Level of Independence: Independent               Hand Dominance        Extremity/Trunk Assessment   Upper Extremity Assessment: Defer to OT evaluation           Lower Extremity Assessment: Generalized weakness         Communication   Communication: No difficulties  Cognition Arousal/Alertness: Awake/alert Behavior During Therapy: WFL for tasks assessed/performed Overall Cognitive Status: Within Functional Limits for tasks assessed                      General Comments General comments (skin integrity, edema, etc.): Pt able to perform cervical AROM without difficulties but reports concordant dizziness/symptoms with vertical head movements more than horizontal head turns. + Marye Round to the left with multi-directional nystagmus, performed left epley manuever. (-) Marye Round to the right but reports concordant dizziness when sitting up from supine position.     Exercises        Assessment/Plan    PT Assessment Patient needs continued PT services  PT Diagnosis Difficulty walking   PT Problem List Decreased strength;Cardiopulmonary status limiting activity;Decreased balance;Decreased mobility;Decreased knowledge of use of DME  PT Treatment Interventions Balance training;Gait training;Stair training;Functional mobility training;Therapeutic activities;Therapeutic exercise;Patient/family education;Neuromuscular re-education   PT Goals (Current goals can be found in the Care Plan section) Acute Rehab PT Goals Patient Stated Goal: to make this dizziness go away PT Goal Formulation: With patient Time For Goal  Achievement: 01/06/16 Potential to Achieve Goals: Good    Frequency Min 4X/week   Barriers to discharge Inaccessible home environment lots of stairs to climb inhome- split level.    Co-evaluation               End of Session Equipment Utilized During Treatment: Gait belt Activity Tolerance: Treatment limited secondary to medical complications (Comment) (dizziness.) Patient left: in bed;with call bell/phone within reach;with bed alarm set Nurse Communication: Mobility status    Functional Assessment Tool Used: clinical judgment Functional Limitation: Mobility: Walking and moving around Mobility: Walking and Moving Around Current Status [id number]): At least 20 percent but less than 40 percent impaired, limited or restricted Mobility: Walking and Moving Around Goal Status [phone]): At least 1 percent but less than 20 percent impaired, limited or restricted    Time: 0907-0954 PT Time Calculation (min) (ACUTE ONLY): 47 min   Charges:   PT Evaluation $PT Eval Moderate Complexity: 1 Procedure PT Treatments $Gait Training: 8-22 mins $Canalith Rep Proc: 8-22 mins   PT G Codes:   PT G-Codes **NOT FOR INPATIENT CLASS** Functional Assessment Tool Used: clinical judgment Functional Limitation: Mobility: Walking and moving around Mobility: Walking and Moving Around Current Status [id number]): At least 20 percent but less than 40 percent impaired, limited or restricted Mobility: Walking and Moving Around Goal Status [phone]): At least 1 percent but less than 20 percent impaired, limited or restricted    [place] 12/23/2015, 10:50 AM Wray Kearns, Cesar Chavez, DPT [phone]

## 2015-12-23 NOTE — Care Management Note (Addendum)
Case Management Note  Patient Details  Name: Warren Weeks MRN: [id number] Date of Birth: 11/03/1945  Subjective/Objective:      Pt in with dizziness. MRI results negative. He is from home with his spouse.             Action/Plan: Awaiting PT/OT recommendations. CM following for discharge disposition.   Addendum: PT recommending outpatient vestibular rehab. CM met with patient and he would like to have outpatient therapy at the [facility]. Orders placed in EPIC and information on the AVS. Pt also with orders for rolling walker. Jermaine with [facility] D[facility] notified and will deliver the equipment to the room.   Expected Discharge Date:  12/23/15               Expected Discharge Plan:     In-House Referral:     Discharge planning Services     Post Acute Care Choice:    Choice offered to:     DME Arranged:    DME Agency:     HH Arranged:    HH Agency:     Status of Service:  In process, will continue to follow  If discussed at Long Length of Stay Meetings, dates discussed:    Additional Comments:  Pollie Friar, RN 12/23/2015, 10:18 AM

## 2015-12-23 NOTE — Care Management Obs Status (Signed)
[facility] NOTIFICATION   Patient Details  Name: DAREN PLANO MRN: [id number] Date of Birth: 1946/01/14   Medicare Observation Status Notification Given:  Yes (mri negative)    Pollie Friar, RN 12/23/2015, 10:37 AM

## 2015-12-23 NOTE — Progress Notes (Signed)
[facility] PROGRESS NOTE  Warren Weeks [id number] DOB: 05/19/1946 DOA: 12/22/2015   PCP: Cathlean Cower, MD     Assessment/Plan: Principal Problem:   Dizziness Active Problems:   Hyperlipidemia   Essential hypertension   70 y.o. male with hypertension, hyperlipidemia, BPH and alcohol abuse who is being followed by cardiologist for carotid stenosis has had recent vascular study for carotids 2 days ago presents to the ER because of persistent dizziness since patient woke up yesterday morning. Denies any weakness of the upper or lower extremities or any loss of consciousness. Denies any blurred vision difficulty speaking or swallowing. Patient's dizziness is on sitting up or trying to stand up. On my exam patient feels intensely dizzy on minimal movements and sitting. Patient also becomes tachycardic. Unable to do orthostatic at this point. Otherwise patient appears nonfocal. Denies any new changes in medications. Denies nausea vomiting headache. Patient has been admitted for further workup for dizziness. Carotid Doppler showed  40-59% RICA stenosis.123456 LICA stenosis  Assessment and plan 1. Dizziness/vertigo - MRI brain /MRA does not show any acute infarct, unremarkable MRA. Carotid Doppler results as above, started meclizine, awake symptomatic improvement, Physical therapy consult. For now aspirin.  2. Hypertension - continue metoprolol and lisinopril. We'll hold off hydrochlorothiazide and gently hydrate due to dizziness. 3. Hyperlipidemia on Crestor. 4. Alcohol abuse - patient is placed on CIWA protocol, thiamine and Ativan.     DVT prophylaxsis  Lovenox  Code Status:  Full code   Family Communication: Discussed in detail with the patient, all imaging results, lab results explained to the patient   Disposition Plan:  Anticipate discharge tomorrow after workup is completed     Consultants:  None  Procedures:  None  Antibiotics: Anti-infectives    None          HPI/Subjective: Patient continues to have dizziness with minimal attempt to move, denies any diplopia nausea vomiting headache  Objective: Filed Vitals:   12/23/15 0045 12/23/15 0118 12/23/15 0300 12/23/15 0500  BP: 170/66 155/75 158/107 147/75  Pulse: 102 79 89 72  Temp:    97.2 F (36.2 C)  TempSrc:    Oral  Resp: 18 16 17 17   Height:  5' 9.5" (1.765 m)    Weight:  109.907 kg (242 lb 4.8 oz)    SpO2: 99% 100% 98% 100%   No intake or output data in the 24 hours ending 12/23/15 1024  Exam:  Examination:  General exam: Appears calm and comfortable  Respiratory system: Clear to auscultation. Respiratory effort normal. Cardiovascular system: S1 & S2 heard, RRR. No JVD, murmurs, rubs, gallops or clicks. No pedal edema. Gastrointestinal system: Abdomen is nondistended, soft and nontender. No organomegaly or masses felt. Normal bowel sounds heard. Central nervous system: Alert and oriented. No focal neurological deficits. Extremities: Symmetric 5 x 5 power. Skin: No rashes, lesions or ulcers Psychiatry: Judgement and insight appear normal. Mood & affect appropriate.     Data Reviewed: I have personally reviewed following labs and imaging studies  Micro Results No results found for this or any previous visit (from the past 240 hour(s)).  Radiology Reports Dg Chest 2 View  12/22/2015  CLINICAL DATA:  Severe dizziness, onset this morning. EXAM: CHEST  2 VIEW COMPARISON:  01/18/2013 FINDINGS: The heart size and mediastinal contours are within normal limits. Both lungs are clear. The visualized skeletal structures are unremarkable. IMPRESSION: Normal exam. Electronically Signed   By: Dina Rich.D.  On: 12/22/2015 20:29   Ct Head Wo Contrast  12/22/2015  CLINICAL DATA:  Acute onset of severe dizziness.  Initial encounter. EXAM: CT HEAD WITHOUT CONTRAST TECHNIQUE: Contiguous axial images were obtained from the base of the skull through the vertex without intravenous  contrast. COMPARISON:  MRI of the brain performed 06/24/2009 FINDINGS: There is no evidence of acute infarction, mass lesion, or intra- or extra-axial hemorrhage on CT. Mild periventricular white matter change likely reflects small vessel ischemic microangiopathy. The posterior fossa, including the cerebellum, brainstem and fourth ventricle, is within normal limits. The third and lateral ventricles, and basal ganglia are unremarkable in appearance. The cerebral hemispheres are symmetric in appearance, with normal gray-white differentiation. No mass effect or midline shift is seen. There is no evidence of fracture; visualized osseous structures are unremarkable in appearance. The visualized portions of the orbits are within normal limits. The paranasal sinuses and mastoid air cells are well-aerated. No significant soft tissue abnormalities are seen. IMPRESSION: 1. No acute intracranial pathology seen on CT. 2. Mild small vessel ischemic microangiopathy. Electronically Signed   By: Garald Balding M.D.   On: 12/22/2015 21:12   Mr Brain Wo Contrast  12/23/2015  CLINICAL DATA:  Initial evaluation for acute dizziness. EXAM: MRI HEAD WITHOUT CONTRAST MRA HEAD WITHOUT CONTRAST TECHNIQUE: Multiplanar, multiecho pulse sequences of the brain and surrounding structures were obtained without intravenous contrast. Angiographic images of the head were obtained using MRA technique without contrast. COMPARISON:  CT from earlier the same day. FINDINGS: MRI HEAD FINDINGS Cerebral volume within normal limits for patient age. Minimal patchy T2/FLAIR hyperintensity within the periventricular and deep white matter, nonspecific, but most likely related to mild chronic vessel ischemic disease. Changes minimal for patient age. No abnormal foci of restricted diffusion to suggest acute infarct. Gray-white matter differentiation maintained. Major intracranial vascular flow voids are preserved. No acute or chronic intracranial hemorrhage. No  areas of chronic infarction. No mass lesion, midline shift, or mass effect. No hydrocephalus. No extra-axial fluid collection. Major dural sinuses are grossly patent. Craniocervical junction normal. Visualized upper cervical spine unremarkable. Pituitary gland within normal limits. No acute abnormality about the globes and orbits. Paranasal sinuses are clear. No mastoid effusion. Inner ear structures normal. Bone marrow signal intensity within normal limits. No scalp soft tissue abnormality. MRA HEAD FINDINGS ANTERIOR CIRCULATION: Visualized distal cervical segments of the internal carotid arteries are patent with antegrade flow. Petrous, cavernous, and supraclinoid segments patent without flow-limiting stenosis. A1 segments, anterior communicating artery, and anterior cerebral arteries well opacified. M1 segments patent without stenosis or occlusion. MCA bifurcations within normal limits. Distal MCA branches well opacified and symmetric. POSTERIOR CIRCULATION: Left vertebral artery dominant and patent to the vertebrobasilar junction. Right vertebral artery not visualized, and may be hypoplastic and/or occluded within the neck. Left posterior inferior cerebral artery patent. Basilar artery widely patent to its distal aspect. Superior cerebral arteries patent bilaterally. Left SCA arises from the left P 8 minutes. Left PCA arises from the basilar artery and is well opacified to its distal aspect. There is a fetal type right PCA, also widely patent to its distal aspect. IMPRESSION: MRI HEAD IMPRESSION: 1. No acute intracranial infarct or other process identified. 2. Minimal for age cerebral white matter disease, nonspecific, but most likely related to chronic small vessel ischemic changes. MRA HEAD IMPRESSION: 1. Nonvisualization of the right vertebral artery, which may be hypoplastic and/or occluded within the neck. 2. Otherwise unremarkable MRA of the brain. Electronically Signed   By: Marland Kitchen  Jeannine Boga M.D.    On: 12/23/2015 01:23   Mr Jodene Nam Head/brain Wo Cm  12/23/2015  CLINICAL DATA:  Initial evaluation for acute dizziness. EXAM: MRI HEAD WITHOUT CONTRAST MRA HEAD WITHOUT CONTRAST TECHNIQUE: Multiplanar, multiecho pulse sequences of the brain and surrounding structures were obtained without intravenous contrast. Angiographic images of the head were obtained using MRA technique without contrast. COMPARISON:  CT from earlier the same day. FINDINGS: MRI HEAD FINDINGS Cerebral volume within normal limits for patient age. Minimal patchy T2/FLAIR hyperintensity within the periventricular and deep white matter, nonspecific, but most likely related to mild chronic vessel ischemic disease. Changes minimal for patient age. No abnormal foci of restricted diffusion to suggest acute infarct. Gray-white matter differentiation maintained. Major intracranial vascular flow voids are preserved. No acute or chronic intracranial hemorrhage. No areas of chronic infarction. No mass lesion, midline shift, or mass effect. No hydrocephalus. No extra-axial fluid collection. Major dural sinuses are grossly patent. Craniocervical junction normal. Visualized upper cervical spine unremarkable. Pituitary gland within normal limits. No acute abnormality about the globes and orbits. Paranasal sinuses are clear. No mastoid effusion. Inner ear structures normal. Bone marrow signal intensity within normal limits. No scalp soft tissue abnormality. MRA HEAD FINDINGS ANTERIOR CIRCULATION: Visualized distal cervical segments of the internal carotid arteries are patent with antegrade flow. Petrous, cavernous, and supraclinoid segments patent without flow-limiting stenosis. A1 segments, anterior communicating artery, and anterior cerebral arteries well opacified. M1 segments patent without stenosis or occlusion. MCA bifurcations within normal limits. Distal MCA branches well opacified and symmetric. POSTERIOR CIRCULATION: Left vertebral artery dominant and  patent to the vertebrobasilar junction. Right vertebral artery not visualized, and may be hypoplastic and/or occluded within the neck. Left posterior inferior cerebral artery patent. Basilar artery widely patent to its distal aspect. Superior cerebral arteries patent bilaterally. Left SCA arises from the left P 8 minutes. Left PCA arises from the basilar artery and is well opacified to its distal aspect. There is a fetal type right PCA, also widely patent to its distal aspect. IMPRESSION: MRI HEAD IMPRESSION: 1. No acute intracranial infarct or other process identified. 2. Minimal for age cerebral white matter disease, nonspecific, but most likely related to chronic small vessel ischemic changes. MRA HEAD IMPRESSION: 1. Nonvisualization of the right vertebral artery, which may be hypoplastic and/or occluded within the neck. 2. Otherwise unremarkable MRA of the brain. Electronically Signed   By: Jeannine Boga M.D.   On: 12/23/2015 01:23     CBC  Recent Labs Lab 12/22/15 2008 12/23/15 0527  WBC 6.8 5.6  HGB 16.0 14.0  HCT 47.7 43.4  PLT 216 172  MCV 90.2 91.9  MCH 30.2 29.7  MCHC 33.5 32.3  RDW 14.3 14.4  LYMPHSABS 0.8  --   MONOABS 0.4  --   EOSABS 0.0  --   BASOSABS 0.0  --     Chemistries   Recent Labs Lab 12/22/15 2145 12/23/15 0527  NA 140 144  K 4.2 3.0*  CL 105 101  CO2 27 24  GLUCOSE 136* 168*  BUN 13 12  CREATININE 1.14 1.04  CALCIUM 9.4 9.9  AST 32 25  ALT 35 29  ALKPHOS 60 52  BILITOT 0.6 0.7   ------------------------------------------------------------------------------------------------------------------ estimated creatinine clearance is 84.9 mL/min (by C-G formula based on Cr of 1.04). ------------------------------------------------------------------------------------------------------------------ No results for input(s): HGBA1C in the last 72  hours. ------------------------------------------------------------------------------------------------------------------  Recent Labs  12/23/15 0527  CHOL 146  HDL 54  LDLCALC 76  TRIG 79  CHOLHDL  2.7   ------------------------------------------------------------------------------------------------------------------ No results for input(s): TSH, T4TOTAL, T3FREE, THYROIDAB in the last 72 hours.  Invalid input(s): FREET3 ------------------------------------------------------------------------------------------------------------------ No results for input(s): VITAMINB12, FOLATE, FERRITIN, TIBC, IRON, RETICCTPCT in the last 72 hours.  Coagulation profile No results for input(s): INR, PROTIME in the last 168 hours.  No results for input(s): DDIMER in the last 72 hours.  Cardiac Enzymes  Recent Labs Lab 12/22/15 2145  TROPONINI <0.03   ------------------------------------------------------------------------------------------------------------------ Invalid input(s): POCBNP   CBG:  Recent Labs Lab 12/22/15 2004  GLUCAP 152*       Studies: Dg Chest 2 View  12/22/2015  CLINICAL DATA:  Severe dizziness, onset this morning. EXAM: CHEST  2 VIEW COMPARISON:  01/18/2013 FINDINGS: The heart size and mediastinal contours are within normal limits. Both lungs are clear. The visualized skeletal structures are unremarkable. IMPRESSION: Normal exam. Electronically Signed   By: Lorriane Shire M.D.   On: 12/22/2015 20:29   Ct Head Wo Contrast  12/22/2015  CLINICAL DATA:  Acute onset of severe dizziness.  Initial encounter. EXAM: CT HEAD WITHOUT CONTRAST TECHNIQUE: Contiguous axial images were obtained from the base of the skull through the vertex without intravenous contrast. COMPARISON:  MRI of the brain performed 06/24/2009 FINDINGS: There is no evidence of acute infarction, mass lesion, or intra- or extra-axial hemorrhage on CT. Mild periventricular white matter change likely reflects  small vessel ischemic microangiopathy. The posterior fossa, including the cerebellum, brainstem and fourth ventricle, is within normal limits. The third and lateral ventricles, and basal ganglia are unremarkable in appearance. The cerebral hemispheres are symmetric in appearance, with normal gray-white differentiation. No mass effect or midline shift is seen. There is no evidence of fracture; visualized osseous structures are unremarkable in appearance. The visualized portions of the orbits are within normal limits. The paranasal sinuses and mastoid air cells are well-aerated. No significant soft tissue abnormalities are seen. IMPRESSION: 1. No acute intracranial pathology seen on CT. 2. Mild small vessel ischemic microangiopathy. Electronically Signed   By: Garald Balding M.D.   On: 12/22/2015 21:12   Mr Brain Wo Contrast  12/23/2015  CLINICAL DATA:  Initial evaluation for acute dizziness. EXAM: MRI HEAD WITHOUT CONTRAST MRA HEAD WITHOUT CONTRAST TECHNIQUE: Multiplanar, multiecho pulse sequences of the brain and surrounding structures were obtained without intravenous contrast. Angiographic images of the head were obtained using MRA technique without contrast. COMPARISON:  CT from earlier the same day. FINDINGS: MRI HEAD FINDINGS Cerebral volume within normal limits for patient age. Minimal patchy T2/FLAIR hyperintensity within the periventricular and deep white matter, nonspecific, but most likely related to mild chronic vessel ischemic disease. Changes minimal for patient age. No abnormal foci of restricted diffusion to suggest acute infarct. Gray-white matter differentiation maintained. Major intracranial vascular flow voids are preserved. No acute or chronic intracranial hemorrhage. No areas of chronic infarction. No mass lesion, midline shift, or mass effect. No hydrocephalus. No extra-axial fluid collection. Major dural sinuses are grossly patent. Craniocervical junction normal. Visualized upper cervical  spine unremarkable. Pituitary gland within normal limits. No acute abnormality about the globes and orbits. Paranasal sinuses are clear. No mastoid effusion. Inner ear structures normal. Bone marrow signal intensity within normal limits. No scalp soft tissue abnormality. MRA HEAD FINDINGS ANTERIOR CIRCULATION: Visualized distal cervical segments of the internal carotid arteries are patent with antegrade flow. Petrous, cavernous, and supraclinoid segments patent without flow-limiting stenosis. A1 segments, anterior communicating artery, and anterior cerebral arteries well opacified. M1 segments patent without stenosis or occlusion. MCA bifurcations within  normal limits. Distal MCA branches well opacified and symmetric. POSTERIOR CIRCULATION: Left vertebral artery dominant and patent to the vertebrobasilar junction. Right vertebral artery not visualized, and may be hypoplastic and/or occluded within the neck. Left posterior inferior cerebral artery patent. Basilar artery widely patent to its distal aspect. Superior cerebral arteries patent bilaterally. Left SCA arises from the left P 8 minutes. Left PCA arises from the basilar artery and is well opacified to its distal aspect. There is a fetal type right PCA, also widely patent to its distal aspect. IMPRESSION: MRI HEAD IMPRESSION: 1. No acute intracranial infarct or other process identified. 2. Minimal for age cerebral white matter disease, nonspecific, but most likely related to chronic small vessel ischemic changes. MRA HEAD IMPRESSION: 1. Nonvisualization of the right vertebral artery, which may be hypoplastic and/or occluded within the neck. 2. Otherwise unremarkable MRA of the brain. Electronically Signed   By: Jeannine Boga M.D.   On: 12/23/2015 01:23   Mr Jodene Nam Head/brain Wo Cm  12/23/2015  CLINICAL DATA:  Initial evaluation for acute dizziness. EXAM: MRI HEAD WITHOUT CONTRAST MRA HEAD WITHOUT CONTRAST TECHNIQUE: Multiplanar, multiecho pulse sequences  of the brain and surrounding structures were obtained without intravenous contrast. Angiographic images of the head were obtained using MRA technique without contrast. COMPARISON:  CT from earlier the same day. FINDINGS: MRI HEAD FINDINGS Cerebral volume within normal limits for patient age. Minimal patchy T2/FLAIR hyperintensity within the periventricular and deep white matter, nonspecific, but most likely related to mild chronic vessel ischemic disease. Changes minimal for patient age. No abnormal foci of restricted diffusion to suggest acute infarct. Gray-white matter differentiation maintained. Major intracranial vascular flow voids are preserved. No acute or chronic intracranial hemorrhage. No areas of chronic infarction. No mass lesion, midline shift, or mass effect. No hydrocephalus. No extra-axial fluid collection. Major dural sinuses are grossly patent. Craniocervical junction normal. Visualized upper cervical spine unremarkable. Pituitary gland within normal limits. No acute abnormality about the globes and orbits. Paranasal sinuses are clear. No mastoid effusion. Inner ear structures normal. Bone marrow signal intensity within normal limits. No scalp soft tissue abnormality. MRA HEAD FINDINGS ANTERIOR CIRCULATION: Visualized distal cervical segments of the internal carotid arteries are patent with antegrade flow. Petrous, cavernous, and supraclinoid segments patent without flow-limiting stenosis. A1 segments, anterior communicating artery, and anterior cerebral arteries well opacified. M1 segments patent without stenosis or occlusion. MCA bifurcations within normal limits. Distal MCA branches well opacified and symmetric. POSTERIOR CIRCULATION: Left vertebral artery dominant and patent to the vertebrobasilar junction. Right vertebral artery not visualized, and may be hypoplastic and/or occluded within the neck. Left posterior inferior cerebral artery patent. Basilar artery widely patent to its distal  aspect. Superior cerebral arteries patent bilaterally. Left SCA arises from the left P 8 minutes. Left PCA arises from the basilar artery and is well opacified to its distal aspect. There is a fetal type right PCA, also widely patent to its distal aspect. IMPRESSION: MRI HEAD IMPRESSION: 1. No acute intracranial infarct or other process identified. 2. Minimal for age cerebral white matter disease, nonspecific, but most likely related to chronic small vessel ischemic changes. MRA HEAD IMPRESSION: 1. Nonvisualization of the right vertebral artery, which may be hypoplastic and/or occluded within the neck. 2. Otherwise unremarkable MRA of the brain. Electronically Signed   By: Jeannine Boga M.D.   On: 12/23/2015 01:23      Lab Results  Component Value Date   HGBA1C 6.5 10/24/2015   HGBA1C 6.1 12/14/2014  HGBA1C 6.5 06/14/2014   Lab Results  Component Value Date   LDLCALC 76 12/23/2015   CREATININE 1.04 12/23/2015       Scheduled Meds: . aspirin  300 mg Rectal Daily   Or  . aspirin  325 mg Oral Daily  . enoxaparin (LOVENOX) injection  40 mg Subcutaneous Q24H  . folic acid  1 mg Oral Daily  . lisinopril  10 mg Oral Daily  . LORazepam  0-4 mg Oral Q6H   Followed by  . [START ON 12/25/2015] LORazepam  0-4 mg Oral Q12H  . meclizine  12.5 mg Oral TID  . metoprolol  50 mg Oral Daily  . multivitamin with minerals  1 tablet Oral Daily  . omega-3 acid ethyl esters  1 g Oral Daily  . pantoprazole  40 mg Oral Daily  . potassium chloride  60 mEq Oral Once  . rosuvastatin  40 mg Oral q1800  . terazosin  5 mg Oral Daily  . thiamine  100 mg Oral Daily   Or  . thiamine  100 mg Intravenous Daily  . triamcinolone  2 spray Nasal Daily   Continuous Infusions: . sodium chloride 75 mL/hr at 12/23/15 0130        Time spent: >30 MINS    [facility]  [facility] Pager [phone]. If 7PM-7AM, please contact night-coverage at [email or web address], password [facility] 12/23/2015, 10:24 AM

## 2015-12-24 DIAGNOSIS — E785 Hyperlipidemia, unspecified: Secondary | ICD-10-CM | POA: Diagnosis not present

## 2015-12-24 DIAGNOSIS — R42 Dizziness and giddiness: Secondary | ICD-10-CM | POA: Insufficient documentation

## 2015-12-24 DIAGNOSIS — I10 Essential (primary) hypertension: Secondary | ICD-10-CM | POA: Diagnosis not present

## 2015-12-24 LAB — COMPREHENSIVE METABOLIC PANEL
ALBUMIN: 3.1 g/dL — AB (ref 3.5–5.0)
ALK PHOS: 49 U/L (ref 38–126)
ALT: 30 U/L (ref 17–63)
AST: 28 U/L (ref 15–41)
Anion gap: 6 (ref 5–15)
BILIRUBIN TOTAL: 0.4 mg/dL (ref 0.3–1.2)
BUN: 13 mg/dL (ref 6–20)
CALCIUM: 8.8 mg/dL — AB (ref 8.9–10.3)
CO2: 24 mmol/L (ref 22–32)
CREATININE: 1.07 mg/dL (ref 0.61–1.24)
Chloride: 110 mmol/L (ref 101–111)
GFR calc Af Amer: >60 mL/min (ref >60)
GLUCOSE: 97 mg/dL (ref 65–99)
Potassium: 4.1 mmol/L (ref 3.5–5.1)
Sodium: 140 mmol/L (ref 135–145)
TOTAL PROTEIN: 6 g/dL — AB (ref 6.5–8.1)

## 2015-12-24 LAB — CBC
HEMATOCRIT: 45.2 % (ref 39.0–52.0)
HEMOGLOBIN: 14.5 g/dL (ref 13.0–17.0)
MCH: 29.9 pg (ref 26.0–34.0)
MCHC: 32.1 g/dL (ref 30.0–36.0)
MCV: 93.2 fL (ref 78.0–100.0)
Platelets: 164 10*3/uL (ref 150–400)
RBC: 4.85 MIL/uL (ref 4.22–5.81)
RDW: 14.7 % (ref 11.5–15.5)
WBC: 5.1 10*3/uL (ref 4.0–10.5)

## 2015-12-24 LAB — HEMOGLOBIN A1C
Hgb A1c MFr Bld: 6.1 % — ABNORMAL HIGH (ref 4.8–5.6)
Mean Plasma Glucose: 128 mg/dL

## 2015-12-24 MED ORDER — MECLIZINE HCL 12.5 MG PO TABS: 12.5000 mg | ORAL_TABLET | Freq: Three times a day (TID) | ORAL | Status: DC | PRN

## 2015-12-24 MED ORDER — THIAMINE HCL 100 MG PO TABS: 100.0000 mg | ORAL_TABLET | Freq: Every day | ORAL | Status: DC

## 2015-12-24 NOTE — Progress Notes (Signed)
Physical Therapy Treatment Patient Details Name: Warren Weeks MRN: [id number] DOB: [birth year] Today's Date: 12/24/2015    History of Present Illness Patient is a 70 y/o male with hx of hypertension, hyperlipidemia, BPH and alcohol abuse presents with dizziness. Head CT, MRI- unremarkable. 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    PT Comments    Pt with report of improved dizziness. Pt c/o of lightheadedness with transition from supine to sit and sit to stand. 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 Pt with no nystagmus or reports of room spinning during vestibular testing therefore ruling out horizontal and posterior canal BPPV.   Follow Up Recommendations  Outpatient PT     Equipment Recommendations  Rolling walker with 5" wheels    Recommendations for Other Services       Precautions / Restrictions Precautions Precautions: Fall Precaution Comments: vertebral artery occlusion Restrictions Weight Bearing Restrictions: No    Mobility  Bed Mobility Overal bed mobility: Needs Assistance Bed Mobility: Rolling;Sidelying to Sit Rolling: Supervision         General bed mobility comments: no physical assist, increased time, pt used gaze stabilization  Transfers Overall transfer level: Needs assistance Equipment used: Rolling walker (2 wheeled) Transfers: Sit to/from Stand Sit to Stand: Min guard         General transfer comment: min guard due to h/o dizziness, no physical assist, good hand placement  Ambulation/Gait Ambulation/Gait assistance: Min assist Ambulation Distance (Feet): 100 Feet Assistive device: Rolling walker (2 wheeled);None Gait Pattern/deviations: Step-through pattern;Decreased stride length Gait velocity: dec Gait velocity interpretation: Below normal speed for age/gender General Gait  Details: attempted ambulation without AD, pt with short, wide, shuffled steps and reaching to hold onto something. Pt given RW and pt with improved fluidity of gait , increased step length. pt with improved stabiltiy with gaze stabilization as well   Stairs            Wheelchair Mobility    Modified Rankin (Stroke Patients Only) Modified Rankin (Stroke Patients Only) Pre-Morbid Rankin Score: No symptoms Modified Rankin: Moderately severe disability     Balance Overall balance assessment: Needs assistance Sitting-balance support: Feet supported Sitting balance-Leahy Scale: Good     Standing balance support: No upper extremity supported Standing balance-Leahy Scale: Fair Standing balance comment: okay statically but requires RW for safe ambulation                    Cognition Arousal/Alertness: Awake/alert Behavior During Therapy: WFL for tasks assessed/performed Overall Cognitive Status: Within Functional Limits for tasks assessed                      Exercises      General Comments General comments (skin integrity, edema, etc.): Vestibular Assessment: completed horizontal and posterior canal BPPV testing and no reports of room spinning or nystagmus. Pt with c/o "light headedness' when coming up to sitting from supine but then dissapates within a minute      Pertinent Vitals/Pain Pain Assessment: No/denies pain    Home Living                      Prior Function            PT Goals (current goals can now be found in the care plan section) Progress towards PT  goals: Progressing toward goals    Frequency  Min 3X/week    PT Plan Frequency needs to be updated    Co-evaluation             End of Session Equipment Utilized During Treatment: Gait belt Activity Tolerance: Patient tolerated treatment well Patient left: in bed;with call bell/phone within reach;with bed alarm set     Time: [id number] PT Time Calculation (min)  (ACUTE ONLY): 25 min  Charges:  $Gait Training: 8-22 mins $Therapeutic Activity: 8-22 mins                    G Codes:      Kingsley Callander 12/24/2015, 10:42 AM   Kittie Plater, PT, DPT Pager #: [phone] Office #: [phone]

## 2015-12-24 NOTE — Progress Notes (Signed)
Pt d/c to home by car with friends. Assessment stable. All questions answered

## 2015-12-24 NOTE — Evaluation (Signed)
Occupational Therapy Evaluation and Discharge Patient Details Name: Warren Weeks MRN: [id number] DOB: 04/27/1946 Today's Date: 12/24/2015    History of Present Illness Patient is a 70 y/o male with hx of hypertension, hyperlipidemia, BPH and alcohol abuse presents with dizziness. Head CT, MRI- unremarkable. 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   Clinical Impression   This 70 yo male admitted with above presents to acute OT with all education completed, we will sign off from acute OT standpoint.    Follow Up Recommendations  No OT follow up    Equipment Recommendations  None recommended by OT       Precautions / Restrictions Precautions Precautions: Fall Precaution Comments: vertebral artery occlusion Restrictions Weight Bearing Restrictions: No      Mobility Bed Mobility Overal bed mobility: Needs Assistance Bed Mobility: Rolling;Sidelying to Sit Rolling: Supervision Sidelying to sit: Supervision       General bed mobility comments: increased time and VCs to use gaze stablization to come up to sit  Transfers Overall transfer level: Needs assistance Equipment used: Rolling walker (2 wheeled) Transfers: Sit to/from Stand Sit to Stand: Supervision         General transfer comment: S to remind him of gaze stablization    Balance Overall balance assessment: Needs assistance Sitting-balance support: No upper extremity supported;Feet supported Sitting balance-Leahy Scale: Good     Standing balance support: No upper extremity supported Standing balance-Leahy Scale: Fair Standing balance comment: staticially he is fair , dynamically he is poor (reliant on RW)                            ADL                                         General ADL Comments: S due to intermittent dizziness/wooziness when up on his feet. He does alot better with this when he uses gaze stablization v. just  trying to move as normal--he is aware of this and verbalizes as so. We talked about the need for a seat for his tub or shower to increase safety with showering OR that he can just sponge bath until he feels safer to get in shower and stand. I also educated him on bringing his legs up to him for LBB/D v. bending over/down to floor--he verbalized understanding and demonstrated this to me.               Pertinent Vitals/Pain Pain Assessment: No/denies pain     Hand Dominance Right   Extremity/Trunk Assessment Upper Extremity Assessment Upper Extremity Assessment: Overall WFL for tasks assessed           Communication Communication Communication: No difficulties   Cognition Arousal/Alertness: Awake/alert Behavior During Therapy: WFL for tasks assessed/performed Overall Cognitive Status: Within Functional Limits for tasks assessed                                Home Living Family/patient expects to be discharged to:: Private residence Living Arrangements: Spouse/significant other Available Help at Discharge: Family;Available 24 hours/day Type of Home: House Home Access: Level entry     Home Layout: Multi-level Alternate Level Stairs-Number of Steps: 7 up and 7 down; split level Alternate Level Stairs-Rails: Right Bathroom Shower/Tub:  Tub/shower unit (on level he is normally on; in basement a walk in shower)   Bathroom Toilet: Handicapped height     Home Equipment: [employer] - single point          Prior Functioning/Environment Level of Independence: Independent             OT Diagnosis: Generalized weakness         OT Goals(Current goals can be found in the care plan section) Acute Rehab OT Goals Patient Stated Goal: dizziness is better today, but it still needs work  OT Frequency:                End of Session Equipment Utilized During Treatment: Rolling walker  Activity Tolerance: Patient tolerated treatment well Patient left: in  chair;with call bell/phone within reach;with chair alarm set   Time: [id number] OT Time Calculation (min): 25 min Charges:  OT General Charges $OT Visit: 1 Procedure OT Evaluation $OT Eval Moderate Complexity: 1 Procedure OT Treatments $Self Care/Home Management : 8-22 mins G-Codes: OT G-codes **NOT FOR INPATIENT CLASS** Functional Assessment Tool Used: Clinical observation Functional Limitation: Self care Self Care Current Status [id number]): At least 1 percent but less than 20 percent impaired, limited or restricted Self Care Goal Status [id number]): At least 1 percent but less than 20 percent impaired, limited or restricted Self Care Discharge Status [phone]): At least 1 percent but less than 20 percent impaired, limited or restricted  Almon Register [id number] 12/24/2015, 11:27 AM

## 2015-12-24 NOTE — Care Management (Signed)
Pt discharging to home with self care. He has walker for home in his room and orders for outpatient therapy entered yesterday with his f/u information on the AVS. Bedside RN updated.

## 2015-12-24 NOTE — Discharge Summary (Addendum)
Physician Discharge Summary  VAN SEYMORE MRN: [id number] DOB/AGE: 1946-02-03 70 y.o.  PCP: Cathlean Cower, MD   Admit date: 12/22/2015 Discharge date: 12/24/2015  Discharge Diagnoses:     Principal Problem:   Dizziness Active Problems:   Hyperlipidemia   Essential hypertension Benign positional vertigo   Follow-up recommendations Follow-up with PCP in 3-5 days , including all  additional recommended appointments as below Follow-up CBC, CMP in 3-5 days Outpatient PT for vestibular rehabilitation setup      Current Discharge Medication List    START taking these medications   Details  meclizine (ANTIVERT) 12.5 MG tablet Take 1-2 tablets (12.5-25 mg total) by mouth 3 (three) times daily as needed for dizziness. Qty: 60 tablet, Refills: 0    thiamine 100 MG tablet Take 1 tablet (100 mg total) by mouth daily. Qty: 30 tablet, Refills: 0      CONTINUE these medications which have NOT CHANGED   Details  aspirin (ECOTRIN LOW STRENGTH) 81 MG EC tablet Take 81 mg by mouth daily.      diclofenac (VOLTAREN) 75 MG EC tablet Take 1 tablet (75 mg total) by mouth as needed. Qty: 60 tablet, Refills: 11    lisinopril (PRINIVIL,ZESTRIL) 10 MG tablet TAKE ONE TABLET BY MOUTH ONCE DAILY Qty: 90 tablet, Refills: 0    metoprolol (LOPRESSOR) 50 MG tablet Take 1 tablet (50 mg total) by mouth daily. Qty: 90 tablet, Refills: 1    Omega-3 Fatty Acids (FISH OIL) 1000 MG CAPS Take 1 capsule by mouth daily.    omeprazole (PRILOSEC) 20 MG capsule TAKE ONE CAPSULE BY MOUTH TWICE DAILY Qty: 180 capsule, Refills: 0    rosuvastatin (CRESTOR) 40 MG tablet TAKE ONE TABLET BY MOUTH ONCE DAILY Qty: 90 tablet, Refills: 2    sildenafil (VIAGRA) 100 MG tablet Take 1 tablet (100 mg total) by mouth as needed for erectile dysfunction. Qty: 10 tablet, Refills: 11    terazosin (HYTRIN) 5 MG capsule Take 1 capsule by mouth daily.    triamcinolone (NASACORT AQ) 55 MCG/ACT AERO nasal inhaler Place 2 sprays  into the nose daily. Qty: 1 Inhaler, Refills: 12    ANDROGEL PUMP 20.25 MG/ACT (1.62%) GEL APPLY 2 PUMPS ONTO SKIN DAILY Qty: 75 g, Refills: 0    cetirizine (ZYRTEC) 10 MG tablet Take 1 tablet (10 mg total) by mouth daily. Qty: 30 tablet, Refills: 11    cyclobenzaprine (FLEXERIL) 5 MG tablet Take 1 tablet (5 mg total) by mouth 3 (three) times daily as needed for muscle spasms. Qty: 60 tablet, Refills: 1    fluticasone (FLONASE) 50 MCG/ACT nasal spray USE TWO SPRAY(S) IN EACH NOSTRIL ONCE DAILY Qty: 16 g, Refills: 0      STOP taking these medications     hydrochlorothiazide (HYDRODIURIL) 25 MG tablet          Discharge Condition: Stable   Discharge Instructions Get Medicines reviewed and adjusted: Please take all your medications with you for your next visit with your Primary MD  Please request your Primary MD to go over all hospital tests and procedure/radiological results at the follow up, please ask your Primary MD to get all Hospital records sent to his/her office.  If you experience worsening of your admission symptoms, develop shortness of breath, life threatening emergency, suicidal or homicidal thoughts you must seek medical attention immediately by calling 911 or calling your MD immediately if symptoms less severe.  You must read complete instructions/literature along with all the possible adverse reactions/side effects  for all the Medicines you take and that have been prescribed to you. Take any new Medicines after you have completely understood and accpet all the possible adverse reactions/side effects.   Do not drive when taking Pain medications.   Do not take more than prescribed Pain, Sleep and Anxiety Medications  Special Instructions: If you have smoked or chewed Tobacco in the last 2 yrs please stop smoking, stop any regular Alcohol and or any Recreational drug use.  Wear Seat belts while driving.  Please note  You were cared for by a hospitalist  during your hospital stay. Once you are discharged, your primary care physician will handle any further medical issues. Please note that NO REFILLS for any discharge medications will be authorized once you are discharged, as it is imperative that you return to your primary care physician (or establish a relationship with a primary care physician if you do not have one) for your aftercare needs so that they can reassess your need for medications and monitor your lab values.  Discharge Instructions    Ambulatory referral to Physical Therapy    Complete by:  As directed   For Vestibular rehab            Allergies  Allergen Reactions  . Cefuroxime Axetil     Rash       Disposition: Discharged with outpatient physical therapy   Consults: * None    Significant Diagnostic Studies:  Dg Chest 2 View  12/22/2015  CLINICAL DATA:  Severe dizziness, onset this morning. EXAM: CHEST  2 VIEW COMPARISON:  01/18/2013 FINDINGS: The heart size and mediastinal contours are within normal limits. Both lungs are clear. The visualized skeletal structures are unremarkable. IMPRESSION: Normal exam. Electronically Signed   By: Lorriane Shire M.D.   On: 12/22/2015 20:29   Ct Head Wo Contrast  12/22/2015  CLINICAL DATA:  Acute onset of severe dizziness.  Initial encounter. EXAM: CT HEAD WITHOUT CONTRAST TECHNIQUE: Contiguous axial images were obtained from the base of the skull through the vertex without intravenous contrast. COMPARISON:  MRI of the brain performed 06/24/2009 FINDINGS: There is no evidence of acute infarction, mass lesion, or intra- or extra-axial hemorrhage on CT. Mild periventricular white matter change likely reflects small vessel ischemic microangiopathy. The posterior fossa, including the cerebellum, brainstem and fourth ventricle, is within normal limits. The third and lateral ventricles, and basal ganglia are unremarkable in appearance. The cerebral hemispheres are symmetric in appearance,  with normal gray-white differentiation. No mass effect or midline shift is seen. There is no evidence of fracture; visualized osseous structures are unremarkable in appearance. The visualized portions of the orbits are within normal limits. The paranasal sinuses and mastoid air cells are well-aerated. No significant soft tissue abnormalities are seen. IMPRESSION: 1. No acute intracranial pathology seen on CT. 2. Mild small vessel ischemic microangiopathy. Electronically Signed   By: Garald Balding M.D.   On: 12/22/2015 21:12   Mr Brain Wo Contrast  12/23/2015  CLINICAL DATA:  Initial evaluation for acute dizziness. EXAM: MRI HEAD WITHOUT CONTRAST MRA HEAD WITHOUT CONTRAST TECHNIQUE: Multiplanar, multiecho pulse sequences of the brain and surrounding structures were obtained without intravenous contrast. Angiographic images of the head were obtained using MRA technique without contrast. COMPARISON:  CT from earlier the same day. FINDINGS: MRI HEAD FINDINGS Cerebral volume within normal limits for patient age. Minimal patchy T2/FLAIR hyperintensity within the periventricular and deep white matter, nonspecific, but most likely related to mild chronic vessel ischemic disease. Changes  minimal for patient age. No abnormal foci of restricted diffusion to suggest acute infarct. Gray-white matter differentiation maintained. Major intracranial vascular flow voids are preserved. No acute or chronic intracranial hemorrhage. No areas of chronic infarction. No mass lesion, midline shift, or mass effect. No hydrocephalus. No extra-axial fluid collection. Major dural sinuses are grossly patent. Craniocervical junction normal. Visualized upper cervical spine unremarkable. Pituitary gland within normal limits. No acute abnormality about the globes and orbits. Paranasal sinuses are clear. No mastoid effusion. Inner ear structures normal. Bone marrow signal intensity within normal limits. No scalp soft tissue abnormality. MRA HEAD  FINDINGS ANTERIOR CIRCULATION: Visualized distal cervical segments of the internal carotid arteries are patent with antegrade flow. Petrous, cavernous, and supraclinoid segments patent without flow-limiting stenosis. A1 segments, anterior communicating artery, and anterior cerebral arteries well opacified. M1 segments patent without stenosis or occlusion. MCA bifurcations within normal limits. Distal MCA branches well opacified and symmetric. POSTERIOR CIRCULATION: Left vertebral artery dominant and patent to the vertebrobasilar junction. Right vertebral artery not visualized, and may be hypoplastic and/or occluded within the neck. Left posterior inferior cerebral artery patent. Basilar artery widely patent to its distal aspect. Superior cerebral arteries patent bilaterally. Left SCA arises from the left P 8 minutes. Left PCA arises from the basilar artery and is well opacified to its distal aspect. There is a fetal type right PCA, also widely patent to its distal aspect. IMPRESSION: MRI HEAD IMPRESSION: 1. No acute intracranial infarct or other process identified. 2. Minimal for age cerebral white matter disease, nonspecific, but most likely related to chronic small vessel ischemic changes. MRA HEAD IMPRESSION: 1. Nonvisualization of the right vertebral artery, which may be hypoplastic and/or occluded within the neck. 2. Otherwise unremarkable MRA of the brain. Electronically Signed   By: Jeannine Boga M.D.   On: 12/23/2015 01:23   Mr Jodene Nam Head/brain Wo Cm  12/23/2015  CLINICAL DATA:  Initial evaluation for acute dizziness. EXAM: MRI HEAD WITHOUT CONTRAST MRA HEAD WITHOUT CONTRAST TECHNIQUE: Multiplanar, multiecho pulse sequences of the brain and surrounding structures were obtained without intravenous contrast. Angiographic images of the head were obtained using MRA technique without contrast. COMPARISON:  CT from earlier the same day. FINDINGS: MRI HEAD FINDINGS Cerebral volume within normal limits for  patient age. Minimal patchy T2/FLAIR hyperintensity within the periventricular and deep white matter, nonspecific, but most likely related to mild chronic vessel ischemic disease. Changes minimal for patient age. No abnormal foci of restricted diffusion to suggest acute infarct. Gray-white matter differentiation maintained. Major intracranial vascular flow voids are preserved. No acute or chronic intracranial hemorrhage. No areas of chronic infarction. No mass lesion, midline shift, or mass effect. No hydrocephalus. No extra-axial fluid collection. Major dural sinuses are grossly patent. Craniocervical junction normal. Visualized upper cervical spine unremarkable. Pituitary gland within normal limits. No acute abnormality about the globes and orbits. Paranasal sinuses are clear. No mastoid effusion. Inner ear structures normal. Bone marrow signal intensity within normal limits. No scalp soft tissue abnormality. MRA HEAD FINDINGS ANTERIOR CIRCULATION: Visualized distal cervical segments of the internal carotid arteries are patent with antegrade flow. Petrous, cavernous, and supraclinoid segments patent without flow-limiting stenosis. A1 segments, anterior communicating artery, and anterior cerebral arteries well opacified. M1 segments patent without stenosis or occlusion. MCA bifurcations within normal limits. Distal MCA branches well opacified and symmetric. POSTERIOR CIRCULATION: Left vertebral artery dominant and patent to the vertebrobasilar junction. Right vertebral artery not visualized, and may be hypoplastic and/or occluded within the neck. Left posterior inferior  cerebral artery patent. Basilar artery widely patent to its distal aspect. Superior cerebral arteries patent bilaterally. Left SCA arises from the left P 8 minutes. Left PCA arises from the basilar artery and is well opacified to its distal aspect. There is a fetal type right PCA, also widely patent to its distal aspect. IMPRESSION: MRI HEAD  IMPRESSION: 1. No acute intracranial infarct or other process identified. 2. Minimal for age cerebral white matter disease, nonspecific, but most likely related to chronic small vessel ischemic changes. MRA HEAD IMPRESSION: 1. Nonvisualization of the right vertebral artery, which may be hypoplastic and/or occluded within the neck. 2. Otherwise unremarkable MRA of the brain. Electronically Signed   By: Jeannine Boga M.D.   On: 12/23/2015 01:23        Filed Weights   12/23/15 0118  Weight: 109.907 kg (242 lb 4.8 oz)     Microbiology: No results found for this or any previous visit (from the past 240 hour(s)).     Blood Culture No results found for: SDES, SPECREQUEST, CULT, REPTSTATUS    Labs: Results for orders placed or performed during the hospital encounter of 12/22/15 (from the past 48 hour(s))  POC CBG, ED     Status: Abnormal   Collection Time: 12/22/15  8:04 PM  Result Value Ref Range   Glucose-Capillary 152 (H) 65 - 99 mg/dL  CBC with Differential     Status: None   Collection Time: 12/22/15  8:08 PM  Result Value Ref Range   WBC 6.8 4.0 - 10.5 K/uL   RBC 5.29 4.22 - 5.81 MIL/uL   Hemoglobin 16.0 13.0 - 17.0 g/dL   HCT 47.7 39.0 - 52.0 %   MCV 90.2 78.0 - 100.0 fL   MCH 30.2 26.0 - 34.0 pg   MCHC 33.5 30.0 - 36.0 g/dL   RDW 14.3 11.5 - 15.5 %   Platelets 216 150 - 400 K/uL   Neutrophils Relative % 83 %   Neutro Abs 5.7 1.7 - 7.7 K/uL   Lymphocytes Relative 12 %   Lymphs Abs 0.8 0.7 - 4.0 K/uL   Monocytes Relative 5 %   Monocytes Absolute 0.4 0.1 - 1.0 K/uL   Eosinophils Relative 0 %   Eosinophils Absolute 0.0 0.0 - 0.7 K/uL   Basophils Relative 0 %   Basophils Absolute 0.0 0.0 - 0.1 K/uL  Urinalysis, Routine w reflex microscopic     Status: Abnormal   Collection Time: 12/22/15  9:00 PM  Result Value Ref Range   Color, Urine YELLOW YELLOW   APPearance CLEAR CLEAR   Specific Gravity, Urine 1.022 1.005 - 1.030   pH 7.0 5.0 - 8.0   Glucose, UA  NEGATIVE NEGATIVE mg/dL   Hgb urine dipstick NEGATIVE NEGATIVE   Bilirubin Urine NEGATIVE NEGATIVE   Ketones, ur 15 (A) NEGATIVE mg/dL   Protein, ur NEGATIVE NEGATIVE mg/dL   Nitrite NEGATIVE NEGATIVE   Leukocytes, UA NEGATIVE NEGATIVE    Comment: MICROSCOPIC NOT DONE ON URINES WITH NEGATIVE PROTEIN, BLOOD, LEUKOCYTES, NITRITE, OR GLUCOSE <1000 mg/dL.  Comprehensive metabolic panel     Status: Abnormal   Collection Time: 12/22/15  9:45 PM  Result Value Ref Range   Sodium 140 135 - 145 mmol/L   Potassium 4.2 3.5 - 5.1 mmol/L   Chloride 105 101 - 111 mmol/L   CO2 27 22 - 32 mmol/L   Glucose, Bld 136 (H) 65 - 99 mg/dL   BUN 13 6 - 20 mg/dL   Creatinine, Ser  1.14 0.61 - 1.24 mg/dL   Calcium 9.4 8.9 - 10.3 mg/dL   Total Protein 6.7 6.5 - 8.1 g/dL   Albumin 3.7 3.5 - 5.0 g/dL   AST 32 15 - 41 U/L   ALT 35 17 - 63 U/L   Alkaline Phosphatase 60 38 - 126 U/L   Total Bilirubin 0.6 0.3 - 1.2 mg/dL   GFR calc non Af Amer >60 >60 mL/min   GFR calc Af Amer >60 >60 mL/min    Comment: (NOTE) The eGFR has been calculated using the CKD EPI equation. This calculation has not been validated in all clinical situations. eGFR's persistently <60 mL/min signify possible Chronic Kidney Disease.    Anion gap 8 5 - 15  Troponin I     Status: None   Collection Time: 12/22/15  9:45 PM  Result Value Ref Range   Troponin I <0.03 <0.03 ng/mL  Hemoglobin A1c     Status: Abnormal   Collection Time: 12/23/15  5:27 AM  Result Value Ref Range   Hgb A1c MFr Bld 6.1 (H) 4.8 - 5.6 %    Comment: (NOTE)         Pre-diabetes: 5.7 - 6.4         Diabetes: >6.4         Glycemic control for adults with diabetes: <7.0    Mean Plasma Glucose 128 mg/dL    Comment: (NOTE) Performed At: [facility] [place], [place] [id number] Lindon Romp MD GM:[id number]   Lipid panel     Status: None   Collection Time: 12/23/15  5:27 AM  Result Value Ref Range   Cholesterol 146 0 - 200 mg/dL    Triglycerides 79 <150 mg/dL   HDL 54 >40 mg/dL   Total CHOL/HDL Ratio 2.7 RATIO   VLDL 16 0 - 40 mg/dL   LDL Cholesterol 76 0 - 99 mg/dL    Comment:        Total Cholesterol/HDL:CHD Risk Coronary Heart Disease Risk Table                     Men   Women  1/2 Average Risk   3.4   3.3  Average Risk       5.0   4.4  2 X Average Risk   9.6   7.1  3 X Average Risk  23.4   11.0        Use the calculated Patient Ratio above and the CHD Risk Table to determine the patient's CHD Risk.        ATP III CLASSIFICATION (LDL):  <100     mg/dL   Optimal  100-129  mg/dL   Near or Above                    Optimal  130-159  mg/dL   Borderline  160-189  mg/dL   High  >190     mg/dL   Very High   Comprehensive metabolic panel     Status: Abnormal   Collection Time: 12/23/15  5:27 AM  Result Value Ref Range   Sodium 144 135 - 145 mmol/L   Potassium 3.0 (L) 3.5 - 5.1 mmol/L    Comment: DELTA CHECK NOTED   Chloride 101 101 - 111 mmol/L   CO2 24 22 - 32 mmol/L   Glucose, Bld 168 (H) 65 - 99 mg/dL   BUN 12 6 - 20 mg/dL   Creatinine, Ser  1.04 0.61 - 1.24 mg/dL   Calcium 9.9 8.9 - 10.3 mg/dL   Total Protein 6.1 (L) 6.5 - 8.1 g/dL   Albumin 3.3 (L) 3.5 - 5.0 g/dL   AST 25 15 - 41 U/L   ALT 29 17 - 63 U/L   Alkaline Phosphatase 52 38 - 126 U/L   Total Bilirubin 0.7 0.3 - 1.2 mg/dL   GFR calc non Af Amer >60 >60 mL/min   GFR calc Af Amer >60 >60 mL/min    Comment: (NOTE) The eGFR has been calculated using the CKD EPI equation. This calculation has not been validated in all clinical situations. eGFR's persistently <60 mL/min signify possible Chronic Kidney Disease.    Anion gap 19 (H) 5 - 15  CBC     Status: None   Collection Time: 12/23/15  5:27 AM  Result Value Ref Range   WBC 5.6 4.0 - 10.5 K/uL   RBC 4.72 4.22 - 5.81 MIL/uL   Hemoglobin 14.0 13.0 - 17.0 g/dL   HCT 43.4 39.0 - 52.0 %   MCV 91.9 78.0 - 100.0 fL   MCH 29.7 26.0 - 34.0 pg   MCHC 32.3 30.0 - 36.0 g/dL   RDW 14.4 11.5 -  15.5 %   Platelets 172 150 - 400 K/uL  CBC     Status: None   Collection Time: 12/24/15  5:24 AM  Result Value Ref Range   WBC 5.1 4.0 - 10.5 K/uL   RBC 4.85 4.22 - 5.81 MIL/uL   Hemoglobin 14.5 13.0 - 17.0 g/dL   HCT 45.2 39.0 - 52.0 %   MCV 93.2 78.0 - 100.0 fL   MCH 29.9 26.0 - 34.0 pg   MCHC 32.1 30.0 - 36.0 g/dL   RDW 14.7 11.5 - 15.5 %   Platelets 164 150 - 400 K/uL  Comprehensive metabolic panel     Status: Abnormal   Collection Time: 12/24/15  5:24 AM  Result Value Ref Range   Sodium 140 135 - 145 mmol/L   Potassium 4.1 3.5 - 5.1 mmol/L    Comment: DELTA CHECK NOTED   Chloride 110 101 - 111 mmol/L   CO2 24 22 - 32 mmol/L   Glucose, Bld 97 65 - 99 mg/dL   BUN 13 6 - 20 mg/dL   Creatinine, Ser 1.07 0.61 - 1.24 mg/dL   Calcium 8.8 (L) 8.9 - 10.3 mg/dL   Total Protein 6.0 (L) 6.5 - 8.1 g/dL   Albumin 3.1 (L) 3.5 - 5.0 g/dL   AST 28 15 - 41 U/L   ALT 30 17 - 63 U/L   Alkaline Phosphatase 49 38 - 126 U/L   Total Bilirubin 0.4 0.3 - 1.2 mg/dL   GFR calc non Af Amer >60 >60 mL/min   GFR calc Af Amer >60 >60 mL/min    Comment: (NOTE) The eGFR has been calculated using the CKD EPI equation. This calculation has not been validated in all clinical situations. eGFR's persistently <60 mL/min signify possible Chronic Kidney Disease.    Anion gap 6 5 - 15     Lipid Panel     Component Value Date/Time   CHOL 146 12/23/2015 0527   TRIG 79 12/23/2015 0527   HDL 54 12/23/2015 0527   CHOLHDL 2.7 12/23/2015 0527   VLDL 16 12/23/2015 0527   LDLCALC 76 12/23/2015 0527   LDLDIRECT 176.0 04/03/2011 0803     Lab Results  Component Value Date   HGBA1C 6.1* 12/23/2015  HGBA1C 6.5 10/24/2015   HGBA1C 6.1 12/14/2014        HPI :  Warren Weeks with hypertension, hyperlipidemia, BPH and alcohol abuse who is being followed by cardiologist for carotid stenosis has had recent vascular study for carotids 2 days ago presents to the ER because of persistent dizziness since  patient woke up yesterday morning. Denies any weakness of the upper or lower extremities or any loss of consciousness. Denies any blurred vision difficulty speaking or swallowing. Patient's dizziness is on sitting up or trying to stand up. On my exam patient feels intensely dizzy on minimal movements and sitting. Patient also becomes tachycardic. Unable to do orthostatic at this point. Otherwise patient appears nonfocal. Denies any new changes in medications. Denies nausea vomiting headache. Patient has been admitted for further workup for dizziness. Carotid Doppler showed 40-59% RICA stenosis.2-24% LICA stenosis  Hospital course 1. Dizziness/vertigo/BPPV  - MRI brain /MRA does not show any acute infarct, unremarkable MRA. Carotid Doppler results as above, started meclizine, some symptomatic improvement on meclizine, Physical therapy recommended outpatient vestibular rehabilitation. Doubt TIA, continue aspirin at baseline. Continue meclizine as needed 2. Hypertension - continue metoprolol and lisinopril. We'll hold off hydrochlorothiazide and gently hydrate due to dizziness. Continue to hold HCTZ until seen by PCP 3. Hyperlipidemia on Crestor. 4. Alcohol abuse - continue thiamine, did not have any signs of withdrawal  Discharge Exam:   Blood pressure 143/88, pulse 81, temperature 98.4 F (36.9 C), temperature source Oral, resp. rate 20, height 5' 9.5" (1.765 m), weight 109.907 kg (242 lb 4.8 oz), SpO2 96 %.  General exam: Appears calm and comfortable  Respiratory system: Clear to auscultation. Respiratory effort normal. Cardiovascular system: S1 & S2 heard, RRR. No JVD, murmurs, rubs, gallops or clicks. No pedal edema. Gastrointestinal system: Abdomen is nondistended, soft and nontender. No organomegaly or masses felt. Normal bowel sounds heard. Central nervous system: Alert and oriented. No focal neurological deficits. Extremities: Symmetric 5 x 5 power. Skin: No rashes, lesions or  ulcers Psychiatry: Judgement and insight appear normal. Mood & affect appropriate.     Follow-up Information    Follow up with Rogue Jury, MD. Schedule an appointment as soon as possible for a visit in 2 days.   Specialty:  Neurology   Why:  As needed   Contact information:   [address] [place] [address] [phone]       Follow up with [facility].   Specialty:  Rehabilitation   Why:  They will contact you to set up the first appointment   Contact information:   [place] [phone]      Follow up with Cathlean Cower, MD. Schedule an appointment as soon as possible for a visit in 3 days.   Specialties:  Internal Medicine, Radiology   Why:  Hospital follow-up with PCP in 3-5 days   Contact information:   [place] [address] [phone]       Signed: Reyne Dumas 12/24/2015, 10:44 AM        Time spent >45 mins

## 2015-12-25 ENCOUNTER — Telehealth: Payer: Self-pay | Admitting: *Deleted

## 2015-12-25 NOTE — Telephone Encounter (Signed)
Transition Care Management Follow-up Telephone Call   Date discharged? 12/24/15   How have you been since you were released from the hospital? Called  Pt he states he is feeling fine   Do you understand why you were in the hospital? YES   Do you understand the discharge instructions? YES   Where were you discharged to? Home   Items Reviewed:  Medications reviewed: YES  Allergies reviewed: YES  Dietary changes reviewed: YES  Referrals reviewed: YES, he states he was told that someone will contact him concerning PT, also had ? About seeing neurologist. Inform pt once he come in next week Dr. Jenny Reichmann will do referrals   Functional Questionnaire:   Activities of Daily Living (ADLs):   He states he are independent in the following: ambulation, bathing and hygiene, feeding, continence, grooming, toileting and dressing States he require assistance with the following: ambulation   Any transportation issues/concerns?: NO   Any patient concerns? NO   Confirmed importance and date/time of follow-up visits scheduled YES, made appt 01/01/16  Provider Appointment booked with Dr. Jenny Reichmann  Confirmed with patient if condition begins to worsen call PCP or go to the ER.  Patient was given the office number and encouraged to call back with question or concerns.  : YES

## 2016-01-01 ENCOUNTER — Ambulatory Visit (INDEPENDENT_AMBULATORY_CARE_PROVIDER_SITE_OTHER): Payer: Medicare Other | Admitting: Internal Medicine

## 2016-01-01 ENCOUNTER — Encounter: Payer: Self-pay | Admitting: Internal Medicine

## 2016-01-01 VITALS — BP 136/80 | HR 86 | Temp 98.1°F | Resp 20 | Wt 242.0 lb

## 2016-01-01 DIAGNOSIS — I10 Essential (primary) hypertension: Secondary | ICD-10-CM | POA: Diagnosis not present

## 2016-01-01 DIAGNOSIS — R42 Dizziness and giddiness: Secondary | ICD-10-CM | POA: Diagnosis not present

## 2016-01-01 DIAGNOSIS — J309 Allergic rhinitis, unspecified: Secondary | ICD-10-CM | POA: Diagnosis not present

## 2016-01-01 DIAGNOSIS — I6523 Occlusion and stenosis of bilateral carotid arteries: Secondary | ICD-10-CM

## 2016-01-01 MED ORDER — METOPROLOL TARTRATE 50 MG PO TABS: ORAL_TABLET | ORAL | Status: DC

## 2016-01-01 MED ORDER — FLUTICASONE PROPIONATE 50 MCG/ACT NA SUSP: NASAL | Status: DC

## 2016-01-01 NOTE — Progress Notes (Addendum)
Subjective:    Patient ID: Warren Weeks, male    DOB: 04-27-1946, 70 y.o.   MRN: [id number]  HPI  Here to f/u post hospn 7/2 - 7/4 with debilitating vertigo/BPPV, imaging neg for acute, HCT stopped, carotid dopplers without significant hemodynamically lesions, PT rec'd outpt vestibular rehab which the pt has not yet started.  Pt did show some initial improved with meclizine and able to be d/c'd 7/4 with recommendation for close f/u.  Since d/c,  Pt denies fever, wt loss, night sweats, loss of appetite, or other constitutional symptoms  Pt denies new neurological symptoms such as new headache, or facial or extremity weakness or numbness  - dizziness has completely resolved.  Pt does not want to go to vestibular rehab.  Pt denies chest pain, increased sob or doe, wheezing, orthopnea, PND, increased LE swelling, palpitations, dizziness or syncope.   Pt denies polydipsia, polyuria.  Has not had any ETOH since d/c and plans to further abstain. Does have several wks ongoing nasal allergy symptoms with clearish congestion, itch and sneezing, without fever, pain, ST, cough, swelling or wheezing. States nasacort not working well Past Medical History  Diagnosis Date  . GLUCOSE INTOLERANCE 09/16/2007  . HYPERCHOLESTEROLEMIA 03/24/2007  . HYPERLIPIDEMIA 09/16/2007  . OBESITY 03/24/2007  . ANXIETY 03/28/2007  . ERECTILE DYSFUNCTION 03/28/2007  . DEPRESSION 03/28/2007  . OTITIS MEDIA, LEFT 06/10/2009  . HYPERTENSION 03/28/2007  . CAROTID ARTERY STENOSIS, BILATERAL 07/01/2009  . PVD 06/25/2009  . HEMORRHOIDS, INTERNAL 03/24/2007  . URI 08/16/2009  . ALLERGIC RHINITIS 09/16/2007  . ESOPHAGITIS 03/24/2007  . ESOPHAGEAL STRICTURE 09/16/2007  . GERD 03/24/2007  . LOW BACK PAIN 03/28/2007  . Dizziness and giddiness 06/08/2009  . RASH-NONVESICULAR 06/17/2009  . CEPHALGIA 06/08/2009  . DYSPNEA/SHORTNESS OF BREATH 09/16/2007  . CHEST PAIN 09/16/2007  . MAGNETIC RESONANCE IMAGING, BRAIN, ABNORMAL 06/26/2009  . ALLERGY 03/24/2007   . COLONIC POLYPS, HX OF 09/16/2007  . NECK PAIN, LEFT 06/20/2010  . Palpitations 06/20/2010  . Impaired glucose tolerance 10/04/2010  . 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 Tonsillectomy    . S/p right knee arthroscopy      reports that he has quit smoking. He has never used smokeless tobacco. He reports that he drinks about 4.2 oz of alcohol per week. He reports that he does not use illicit drugs. family history includes Coronary artery disease in his brother and sister; Heart disease in his father; Lung cancer in his sister; Lymphoma in his mother; Ovarian cancer in his sister. There is no history of Colon cancer, Esophageal cancer, Rectal cancer, or Stomach cancer. Allergies  Allergen Reactions  . Cefuroxime Axetil     Rash    Current Outpatient Prescriptions on File Prior to Visit  Medication Sig Dispense Refill  . ANDROGEL PUMP 20.25 MG/ACT (1.62%) GEL APPLY 2 PUMPS ONTO SKIN DAILY 75 g 0  . aspirin (ECOTRIN LOW STRENGTH) 81 MG EC tablet Take 81 mg by mouth daily.      . cyclobenzaprine (FLEXERIL) 5 MG tablet Take 1 tablet (5 mg total) by mouth 3 (three) times daily as needed for muscle spasms. 60 tablet 1  . diclofenac (VOLTAREN) 75 MG EC tablet Take 1 tablet (75 mg total) by mouth as needed. 60 tablet 11  . lisinopril (PRINIVIL,ZESTRIL) 10 MG tablet TAKE ONE TABLET BY MOUTH ONCE DAILY 90 tablet 0  . meclizine (ANTIVERT) 12.5 MG tablet Take 1-2 tablets (12.5-25 mg total) by mouth  3 (three) times daily as needed for dizziness. 60 tablet 0  . Omega-3 Fatty Acids (FISH OIL) 1000 MG CAPS Take 1 capsule by mouth daily.    . rosuvastatin (CRESTOR) 40 MG tablet TAKE ONE TABLET BY MOUTH ONCE DAILY 90 tablet 2  . sildenafil (VIAGRA) 100 MG tablet Take 1 tablet (100 mg total) by mouth as needed for erectile dysfunction. 10 tablet 11  . terazosin (HYTRIN) 5 MG capsule Take 1 capsule by mouth daily.    Marland Kitchen thiamine 100 MG tablet Take 1 tablet (100  mg total) by mouth daily. 30 tablet 0   No current facility-administered medications on file prior to visit.   Review of Systems  Constitutional: Negative for unusual diaphoresis or night sweats HENT: Negative for ear swelling or discharge Eyes: Negative for worsening visual haziness  Respiratory: Negative for choking and stridor.   Gastrointestinal: Negative for distension or worsening eructation Genitourinary: Negative for retention or change in urine volume.  Musculoskeletal: Negative for other MSK pain or swelling Skin: Negative for color change and worsening wound Neurological: Negative for tremors and numbness other than noted  Psychiatric/Behavioral: Negative for decreased concentration or agitation other than above       Objective:   Physical Exam BP 136/80 mmHg  Pulse 86  Temp(Src) 98.1 F (36.7 C) (Oral)  Resp 20  Wt 242 lb (109.77 kg)  SpO2 92% VS noted,  Constitutional: Pt appears in no apparent distress HENT: Head: NCAT.  Right Ear: External ear normal.  Left Ear: External ear normal.  Eyes: . Pupils are equal, round, and reactive to light. Conjunctivae and EOM are normal Bilat tm's with mild erythema.  Max sinus areas non tender.  Pharynx with mild erythema, no exudate Neck: Normal range of motion. Neck supple.  Cardiovascular: Normal rate and regular rhythm.   Pulmonary/Chest: Effort normal and breath sounds without rales or wheezing.  Abd:  Soft, NT, ND, + BS Neurological: Pt is alert. Not confused , motor grossly intact, cn 2-12 intact Skin: Skin is warm. No rash, no LE edema Psychiatric: Pt behavior is normal. No agitation.     Assessment & Plan:

## 2016-01-01 NOTE — Patient Instructions (Signed)
Ok to change the nasacort back to the flonase  OK to stay off the fluid pill for now  Please continue all other medications as before, and refills have been done if requested.  Please have the pharmacy call with any other refills you may need.  Please keep your appointments with your specialists as you may have planned

## 2016-01-01 NOTE — Progress Notes (Signed)
Pre visit review using our clinic review tool, if applicable. No additional management support is needed unless otherwise documented below in the visit note. 

## 2016-01-09 NOTE — Assessment & Plan Note (Addendum)
Asympt, cont asa, statin

## 2016-01-09 NOTE — Assessment & Plan Note (Signed)
symtpoms resolved, cont meclizine prn recurrence, pt delinces vestib rehab,  to f/u any worsening symptoms or concerns

## 2016-01-09 NOTE — Assessment & Plan Note (Addendum)
Ok for [employer] and change nasacort to flonase otc asd,  to f/u any worsening symptoms or concerns

## 2016-01-09 NOTE — Assessment & Plan Note (Signed)
stable overall by history and exam, recent data reviewed with pt, and pt to continue medical treatment as before,  to f/u any worsening symptoms or concerns BP Readings from Last 3 Encounters:  01/01/16 136/80  12/24/15 143/88  10/24/15 160/98

## 2016-01-19 ENCOUNTER — Other Ambulatory Visit: Payer: Self-pay | Admitting: Internal Medicine

## 2016-01-22 ENCOUNTER — Other Ambulatory Visit: Payer: Self-pay | Admitting: Internal Medicine

## 2016-02-05 ENCOUNTER — Encounter: Payer: Self-pay | Admitting: Internal Medicine

## 2016-02-05 ENCOUNTER — Ambulatory Visit (INDEPENDENT_AMBULATORY_CARE_PROVIDER_SITE_OTHER): Payer: Medicare Other | Admitting: Internal Medicine

## 2016-02-05 VITALS — BP 132/80 | HR 99 | Temp 98.4°F | Resp 20 | Wt 250.0 lb

## 2016-02-05 DIAGNOSIS — I872 Venous insufficiency (chronic) (peripheral): Secondary | ICD-10-CM | POA: Insufficient documentation

## 2016-02-05 DIAGNOSIS — M545 Low back pain, unspecified: Secondary | ICD-10-CM

## 2016-02-05 DIAGNOSIS — R42 Dizziness and giddiness: Secondary | ICD-10-CM

## 2016-02-05 DIAGNOSIS — M7989 Other specified soft tissue disorders: Secondary | ICD-10-CM | POA: Diagnosis not present

## 2016-02-05 MED ORDER — HYDROCHLOROTHIAZIDE 25 MG PO TABS: 25.0000 mg | ORAL_TABLET | Freq: Every day | ORAL | 3 refills | Status: DC

## 2016-02-05 MED ORDER — MECLIZINE HCL 12.5 MG PO TABS: 12.5000 mg | ORAL_TABLET | Freq: Three times a day (TID) | ORAL | 0 refills | Status: DC | PRN

## 2016-02-05 NOTE — Progress Notes (Signed)
Subjective:    Patient ID: Warren Weeks, male    DOB: 10-09-1945, 70 y.o.   MRN: [id number]  HPI  Here to fu with c/o 1 wk lower back pain, mild to mod, worse to stand or bend, no bowel or bladder change, fever, wt loss,  worsening LE pain/numbness/weakness, gait change or falls, except the pain on the right radiates to the right buttock.  Pt denies chest pain, increased sob or doe, wheezing, orthopnea, PND, palpitations, dizziness or syncope, but also has 8 lb wt increased and left > right LE swelling that started after recent HCT 25 stopped mid July during episode of dizziness.  Meclizine helped at the time and requests refill as still has some vertigo with lying down now but none with other position change. Pt denies new neurological symptoms such as new headache, or facial or extremity weakness or numbness  Pt denies polydipsia, polyuria, Wt Readings from Last 3 Encounters:  02/05/16 250 lb (113.4 kg)  01/01/16 242 lb (109.8 kg)  12/23/15 242 lb 4.8 oz (109.9 kg)   Past Medical History:  Diagnosis Date  . ALLERGIC RHINITIS 09/16/2007  . ALLERGY 03/24/2007  . ANXIETY 03/28/2007  . CAROTID ARTERY STENOSIS, BILATERAL 07/01/2009  . CEPHALGIA 06/08/2009  . CHEST PAIN 09/16/2007  . COLONIC POLYPS, HX OF 09/16/2007  . DEPRESSION 03/28/2007  . Dizziness and giddiness 06/08/2009  . DYSPNEA/SHORTNESS OF BREATH 09/16/2007  . ERECTILE DYSFUNCTION 03/28/2007  . ESOPHAGEAL STRICTURE 09/16/2007  . ESOPHAGITIS 03/24/2007  . GERD 03/24/2007  . GLUCOSE INTOLERANCE 09/16/2007  . HEMORRHOIDS, INTERNAL 03/24/2007  . HYPERCHOLESTEROLEMIA 03/24/2007  . HYPERLIPIDEMIA 09/16/2007  . HYPERTENSION 03/28/2007  . 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 Impaired glucose tolerance 10/04/2010  . LOW BACK PAIN 03/28/2007  . MAGNETIC RESONANCE IMAGING, BRAIN, ABNORMAL 06/26/2009  . NECK PAIN, LEFT 06/20/2010  . OBESITY 03/24/2007  . OTITIS MEDIA, LEFT 06/10/2009  . Palpitations 06/20/2010  . PVD 06/25/2009  . RASH-NONVESICULAR  06/17/2009  . URI 08/16/2009   Past Surgical History:  Procedure Laterality Date  . s/p right knee arthroscopy    . TONSILLECTOMY      reports that he has quit smoking. He has never used smokeless tobacco. He reports that he drinks about 4.2 oz of alcohol per week . He reports that he does not use drugs. family history includes Coronary artery disease in his brother and sister; Heart disease in his father; Lung cancer in his sister; Lymphoma in his mother; Ovarian cancer in his sister. Allergies  Allergen Reactions  . Cefuroxime Axetil     Rash    Current Outpatient Prescriptions on File Prior to Visit  Medication Sig Dispense Refill  . ANDROGEL PUMP 20.25 MG/ACT (1.62%) GEL APPLY 2 PUMPS ONTO SKIN DAILY 75 g 0  . aspirin (ECOTRIN LOW STRENGTH) 81 MG EC tablet Take 81 mg by mouth daily.      . cyclobenzaprine (FLEXERIL) 5 MG tablet Take 1 tablet (5 mg total) by mouth 3 (three) times daily as needed for muscle spasms. 60 tablet 1  . diclofenac (VOLTAREN) 75 MG EC tablet TAKE ONE TABLET BY MOUTH AS NEEDED 60 tablet 0  . fluticasone (FLONASE) 50 MCG/ACT nasal spray USE TWO SPRAY(S) IN EACH NOSTRIL ONCE DAILY 16 g 5  . lisinopril (PRINIVIL,ZESTRIL) 10 MG tablet TAKE ONE TABLET BY MOUTH ONCE DAILY 90 tablet 0  . metoprolol (LOPRESSOR) 50 MG tablet 1/2 tab by mouth twice per day 90 tablet 3  . Omega-3 Fatty Acids (FISH OIL)  1000 MG CAPS Take 1 capsule by mouth daily.    . rosuvastatin (CRESTOR) 40 MG tablet TAKE ONE TABLET BY MOUTH ONCE DAILY 90 tablet 0  . sildenafil (VIAGRA) 100 MG tablet Take 1 tablet (100 mg total) by mouth as needed for erectile dysfunction. 10 tablet 11  . terazosin (HYTRIN) 5 MG capsule Take 1 capsule by mouth daily.    Marland Kitchen thiamine 100 MG tablet Take 1 tablet (100 mg total) by mouth daily. 30 tablet 0   No current facility-administered medications on file prior to visit.    Review of Systems  Constitutional: Negative for unusual diaphoresis or night sweats HENT:  Negative for ear swelling or discharge Eyes: Negative for worsening visual haziness  Respiratory: Negative for choking and stridor.   Gastrointestinal: Negative for distension or worsening eructation Genitourinary: Negative for retention or change in urine volume.  Musculoskeletal: Negative for other MSK pain or swelling Skin: Negative for color change and worsening wound Neurological: Negative for tremors and numbness other than noted  Psychiatric/Behavioral: Negative for decreased concentration or agitation other than above       Objective:   Physical Exam BP 132/80   Pulse 99   Temp 98.4 F (36.9 C) (Oral)   Resp 20   Wt 250 lb (113.4 kg)   SpO2 97%   BMI 36.39 kg/m  VS noted,  Constitutional: Pt appears in no apparent distress HENT: Head: NCAT.  Right Ear: External ear normal.  Left Ear: External ear normal.  Eyes: . Pupils are equal, round, and reactive to light. Conjunctivae and EOM are normal Neck: Normal range of motion. Neck supple.  Cardiovascular: Normal rate and regular rhythm.   Pulmonary/Chest: Effort normal and breath sounds without rales or wheezing.  Abd:  Soft, NT, ND, + BS Spine nontender, does have mild bilat right > left paravertebral tender Neurological: Pt is alert. Not confused , motor 5/5 intact Skin: Skin is warm. No rash, 1-2+ LE edema to knees left > right , no calf tender Psychiatric: Pt behavior is normal. No agitation.   ecg today personally reviewed: Sinus  Rhythm  WITHIN NORMAL LIMITS  Lab Results  Component Value Date   WBC 5.1 12/24/2015   HGB 14.5 12/24/2015   HCT 45.2 12/24/2015   PLT 164 12/24/2015   GLUCOSE 97 12/24/2015   CHOL 146 12/23/2015   TRIG 79 12/23/2015   HDL 54 12/23/2015   LDLDIRECT 176.0 04/03/2011   LDLCALC 76 12/23/2015   ALT 30 12/24/2015   AST 28 12/24/2015   NA 140 12/24/2015   K 4.1 12/24/2015   CL 110 12/24/2015   CREATININE 1.07 12/24/2015   BUN 13 12/24/2015   CO2 24 12/24/2015   TSH 2.18  10/24/2015   PSA 0.12 10/24/2015   HGBA1C 6.1 (H) 12/23/2015       Assessment & Plan:

## 2016-02-05 NOTE — Assessment & Plan Note (Signed)
Etiology unclear, possibly related to underlying djd or ddd, declines pain med for now, for ortho referral

## 2016-02-05 NOTE — Assessment & Plan Note (Signed)
C/w acute diast dysfxn most likely vs venous insufficiency, for hct 25 qd re-start, declines echo,

## 2016-02-05 NOTE — Progress Notes (Signed)
Pre visit review using our clinic review tool, if applicable. No additional management support is needed unless otherwise documented below in the visit note. 

## 2016-02-05 NOTE — Assessment & Plan Note (Signed)
Ok for cont'd meclizinre prn, to f/u any worsening symptoms or concerns

## 2016-02-05 NOTE — Patient Instructions (Signed)
Your EKG was OK today  Please take all new medication as prescribed - the fluid pill  Please continue all other medications as before, and refills have been done if requested - the meclizine  Please have the pharmacy call with any other refills you may need.  Please keep your appointments with your specialists as you may have planned  You will be contacted regarding the referral for: orthopedic

## 2016-02-21 ENCOUNTER — Ambulatory Visit (INDEPENDENT_AMBULATORY_CARE_PROVIDER_SITE_OTHER): Payer: Medicare Other | Admitting: Internal Medicine

## 2016-02-21 ENCOUNTER — Encounter: Payer: Self-pay | Admitting: Internal Medicine

## 2016-02-21 VITALS — BP 112/80 | HR 92 | Temp 98.4°F | Wt 243.0 lb

## 2016-02-21 DIAGNOSIS — L259 Unspecified contact dermatitis, unspecified cause: Secondary | ICD-10-CM

## 2016-02-21 DIAGNOSIS — R21 Rash and other nonspecific skin eruption: Secondary | ICD-10-CM | POA: Diagnosis not present

## 2016-02-21 DIAGNOSIS — R609 Edema, unspecified: Secondary | ICD-10-CM

## 2016-02-21 DIAGNOSIS — I10 Essential (primary) hypertension: Secondary | ICD-10-CM | POA: Diagnosis not present

## 2016-02-21 MED ORDER — PREDNISONE 10 MG PO TABS: ORAL_TABLET | ORAL | 0 refills | Status: DC

## 2016-02-21 MED ORDER — METHYLPREDNISOLONE ACETATE 80 MG/ML IJ SUSP
80.0000 mg | Freq: Once | INTRAMUSCULAR | Status: AC
  Administered 2016-02-21: 80 mg via INTRAMUSCULAR

## 2016-02-21 NOTE — Progress Notes (Signed)
Subjective:    Patient ID: Warren Weeks, male    DOB: [birth year], 70 y.o.   MRN: [id number]  HPI  Here to f/u with c/o itchy rash to numerous small areas of the abd, legs and arms after working in the yard.  Has some raised erythem lesions, non painful, no fever.  Pt denies chest pain, increased sob or doe, wheezing, orthopnea, PND, increased LE swelling, palpitations, dizziness or syncope. In fact, taking the hct 25 mg with wt loss and swelling decreased Wt Readings from Last 3 Encounters:  02/21/16 243 lb (110.2 kg)  02/05/16 250 lb (113.4 kg)  01/01/16 242 lb (109.8 kg)  Pt denies new neurological symptoms such as new headache, or facial or extremity weakness or numbness   Pt denies polydipsia, polyuria Past Medical History:  Diagnosis Date  . ALLERGIC RHINITIS 09/16/2007  . ALLERGY 03/24/2007  . ANXIETY 03/28/2007  . CAROTID ARTERY STENOSIS, BILATERAL 07/01/2009  . CEPHALGIA 06/08/2009  . CHEST PAIN 09/16/2007  . COLONIC POLYPS, HX OF 09/16/2007  . DEPRESSION 03/28/2007  . Dizziness and giddiness 06/08/2009  . DYSPNEA/SHORTNESS OF BREATH 09/16/2007  . ERECTILE DYSFUNCTION 03/28/2007  . ESOPHAGEAL STRICTURE 09/16/2007  . ESOPHAGITIS 03/24/2007  . GERD 03/24/2007  . GLUCOSE INTOLERANCE 09/16/2007  . HEMORRHOIDS, INTERNAL 03/24/2007  . HYPERCHOLESTEROLEMIA 03/24/2007  . HYPERLIPIDEMIA 09/16/2007  . HYPERTENSION 03/28/2007  . 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 Impaired glucose tolerance 10/04/2010  . LOW BACK PAIN 03/28/2007  . MAGNETIC RESONANCE IMAGING, BRAIN, ABNORMAL 06/26/2009  . NECK PAIN, LEFT 06/20/2010  . OBESITY 03/24/2007  . OTITIS MEDIA, LEFT 06/10/2009  . Palpitations 06/20/2010  . PVD 06/25/2009  . RASH-NONVESICULAR 06/17/2009  . URI 08/16/2009   Past Surgical History:  Procedure Laterality Date  . s/p right knee arthroscopy    . TONSILLECTOMY      reports that he has quit smoking. He has never used smokeless tobacco. He reports that he drinks about 4.2 oz of alcohol per week . He  reports that he does not use drugs. family history includes Coronary artery disease in his brother and sister; Heart disease in his father; Lung cancer in his sister; Lymphoma in his mother; Ovarian cancer in his sister. Allergies  Allergen Reactions  . Cefuroxime Axetil     Rash    Current Outpatient Prescriptions on File Prior to Visit  Medication Sig Dispense Refill  . ANDROGEL PUMP 20.25 MG/ACT (1.62%) GEL APPLY 2 PUMPS ONTO SKIN DAILY 75 g 0  . aspirin (ECOTRIN LOW STRENGTH) 81 MG EC tablet Take 81 mg by mouth daily.      . cyclobenzaprine (FLEXERIL) 5 MG tablet Take 1 tablet (5 mg total) by mouth 3 (three) times daily as needed for muscle spasms. 60 tablet 1  . diclofenac (VOLTAREN) 75 MG EC tablet TAKE ONE TABLET BY MOUTH AS NEEDED 60 tablet 0  . fluticasone (FLONASE) 50 MCG/ACT nasal spray USE TWO SPRAY(S) IN EACH NOSTRIL ONCE DAILY 16 g 5  . hydrochlorothiazide (HYDRODIURIL) 25 MG tablet Take 1 tablet (25 mg total) by mouth daily. 90 tablet 3  . lisinopril (PRINIVIL,ZESTRIL) 10 MG tablet TAKE ONE TABLET BY MOUTH ONCE DAILY 90 tablet 0  . meclizine (ANTIVERT) 12.5 MG tablet Take 1-2 tablets (12.5-25 mg total) by mouth 3 (three) times daily as needed for dizziness. 60 tablet 0  . metoprolol (LOPRESSOR) 50 MG tablet 1/2 tab by mouth twice per day 90 tablet 3  . Omega-3 Fatty Acids (FISH OIL) 1000 MG CAPS Take  1 capsule by mouth daily.    . rosuvastatin (CRESTOR) 40 MG tablet TAKE ONE TABLET BY MOUTH ONCE DAILY 90 tablet 0  . sildenafil (VIAGRA) 100 MG tablet Take 1 tablet (100 mg total) by mouth as needed for erectile dysfunction. 10 tablet 11  . terazosin (HYTRIN) 5 MG capsule Take 1 capsule by mouth daily.    Marland Kitchen thiamine 100 MG tablet Take 1 tablet (100 mg total) by mouth daily. 30 tablet 0   No current facility-administered medications on file prior to visit.    Review of Systems  Constitutional: Negative for unusual diaphoresis or night sweats HENT: Negative for ear swelling or  discharge Eyes: Negative for worsening visual haziness  Respiratory: Negative for choking and stridor.   Gastrointestinal: Negative for distension or worsening eructation Genitourinary: Negative for retention or change in urine volume.  Musculoskeletal: Negative for other MSK pain or swelling Skin: Negative for color change and worsening wound Neurological: Negative for tremors and numbness other than noted  Psychiatric/Behavioral: Negative for decreased concentration or agitation other than above       Objective:   Physical Exam BP 112/80   Pulse 92   Temp 98.4 F (36.9 C) (Oral)   Wt 243 lb (110.2 kg)   SpO2 94%   BMI 35.37 kg/m  VS noted,  Constitutional: Pt appears in no apparent distress HENT: Head: NCAT.  Right Ear: External ear normal.  Left Ear: External ear normal.  Eyes: . Pupils are equal, round, and reactive to light. Conjunctivae and EOM are normal Neck: Normal range of motion. Neck supple.  Cardiovascular: Normal rate and regular rhythm.   Pulmonary/Chest: Effort normal and breath sounds without rales or wheezing.  Abd:  Soft, NT, ND, + BS Neurological: Pt is alert. Not confused , motor grossly intact Skin: Skin is warm. Trace bilat LE edema left > right, with multiple nontender erythem lesions to arms, abd, and legs several in linear fashion, non weepy or swelling Psychiatric: Pt behavior is normal. No agitation.     Assessment & Plan:

## 2016-02-21 NOTE — Progress Notes (Signed)
Pre visit review using our clinic review tool, if applicable. No additional management support is needed unless otherwise documented below in the visit note. 

## 2016-02-21 NOTE — Patient Instructions (Signed)
You had the steroid shot today  Please take all new medication as prescribed - the prednisone  Please continue all other medications as before, and refills have been done if requested.  Please have the pharmacy call with any other refills you may need.  Please continue your efforts at being more active, low cholesterol diet, and weight control.  Please keep your appointments with your specialists as you may have planned   

## 2016-02-22 NOTE — Assessment & Plan Note (Signed)
BP Readings from Last 3 Encounters:  02/21/16 112/80  02/05/16 132/80  01/01/16 136/80   stable overall by history and exam, recent data reviewed with pt, and pt to continue medical treatment as before,  to f/u any worsening symptoms or concerns

## 2016-02-22 NOTE — Assessment & Plan Note (Signed)
Improved with reduced edema and wt, to cont hct,  to f/u any worsening symptoms or concerns

## 2016-02-22 NOTE — Assessment & Plan Note (Signed)
C/w contact dermatitis, for depomedrol IM, predpac asd,  to f/u any worsening symptoms or concerns

## 2016-02-28 ENCOUNTER — Other Ambulatory Visit: Payer: Self-pay | Admitting: Internal Medicine

## 2016-03-19 ENCOUNTER — Telehealth: Payer: Self-pay | Admitting: Emergency Medicine

## 2016-03-19 ENCOUNTER — Other Ambulatory Visit: Payer: Self-pay | Admitting: Internal Medicine

## 2016-03-19 NOTE — Telephone Encounter (Signed)
Same answer as before °

## 2016-03-19 NOTE — Telephone Encounter (Signed)
OK, could be a virall illness, which would tend to pass eventually; ok to try OTC Mucinex and Tylenol as needed; may need to consider OV if having fever or worsening pain

## 2016-03-19 NOTE — Telephone Encounter (Signed)
This is not normally a refillable medication on request, as it is an antibiotic  See other note regarding recommendations. thanks

## 2016-03-19 NOTE — Telephone Encounter (Signed)
Left patient vm to call back.  °

## 2016-03-19 NOTE — Telephone Encounter (Signed)
Pt called and stated his sinuses are draining, nose is stopped up, cough and weak and achy. He doesn't want to make an appointment to come in and be seen. He wants to know if you will write him a prescription or recommend something. Thanks.

## 2016-03-20 ENCOUNTER — Ambulatory Visit (INDEPENDENT_AMBULATORY_CARE_PROVIDER_SITE_OTHER): Payer: Medicare Other | Admitting: Family

## 2016-03-20 ENCOUNTER — Encounter: Payer: Self-pay | Admitting: Family

## 2016-03-20 VITALS — BP 110/60 | HR 92 | Temp 97.7°F | Resp 16 | Ht 69.5 in | Wt 241.0 lb

## 2016-03-20 DIAGNOSIS — Z23 Encounter for immunization: Secondary | ICD-10-CM

## 2016-03-20 DIAGNOSIS — J069 Acute upper respiratory infection, unspecified: Secondary | ICD-10-CM

## 2016-03-20 MED ORDER — AZITHROMYCIN 250 MG PO TABS: ORAL_TABLET | ORAL | 0 refills | Status: DC

## 2016-03-20 NOTE — Progress Notes (Signed)
Subjective:    Patient ID: Warren Weeks, male    DOB: [birth year], 70 y.o.   MRN: [id number]  Chief Complaint  Patient presents with  . Sore Throat    sore throat, hurts to swallow, feels it in ears, dry cough    HPI:  Warren Weeks is a 70 y.o. 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); CEPHALGIA (06/08/2009); CHEST PAIN (09/16/2007); COLONIC POLYPS, HX OF (09/16/2007); DEPRESSION (03/28/2007); Dizziness and giddiness (06/08/2009); DYSPNEA/SHORTNESS OF BREATH (09/16/2007); ERECTILE DYSFUNCTION (03/28/2007); ESOPHAGEAL STRICTURE (09/16/2007); ESOPHAGITIS (03/24/2007); GERD (03/24/2007); GLUCOSE INTOLERANCE (09/16/2007); HEMORRHOIDS, INTERNAL (03/24/2007); HYPERCHOLESTEROLEMIA (03/24/2007); HYPERLIPIDEMIA (09/16/2007); HYPERTENSION (03/28/2007); Hypogonadism male (10/09/2011); Impaired glucose tolerance (10/04/2010); LOW BACK PAIN (03/28/2007); MAGNETIC RESONANCE IMAGING, BRAIN, ABNORMAL (06/26/2009); NECK PAIN, LEFT (06/20/2010); OBESITY (03/24/2007); OTITIS MEDIA, LEFT (06/10/2009); Palpitations (06/20/2010); PVD (06/25/2009); RASH-NONVESICULAR (06/17/2009); and URI (08/16/2009). and presents today for an acute office visit.   Sore throat - This is a new problem. Associated symptoms of sore throat, sinus pressure and dry cough that started about 1 day ago. No fevers. Modifying factors include Alkaseltzer plus and garggling with salt water which has not helped very much. Timing of the symptoms are generally worse at night and the severity of the symptoms is enough to effect his sleep. No rashes. No recent antibiotics that he can recall.   Allergies  Allergen Reactions  . Cefuroxime Axetil     Rash       Outpatient Medications Prior to Visit  Medication Sig Dispense Refill  . ANDROGEL PUMP 20.25 MG/ACT (1.62%) GEL APPLY 2 PUMPS ONTO SKIN DAILY 75 g 0  . aspirin (ECOTRIN LOW STRENGTH) 81 MG EC  tablet Take 81 mg by mouth daily.      . cyclobenzaprine (FLEXERIL) 5 MG tablet Take 1 tablet (5 mg total) by mouth 3 (three) times daily as needed for muscle spasms. 60 tablet 1  . diclofenac (VOLTAREN) 75 MG EC tablet TAKE ONE TABLET BY MOUTH AS NEEDED 60 tablet 0  . fluticasone (FLONASE) 50 MCG/ACT nasal spray USE TWO SPRAY(S) IN EACH NOSTRIL ONCE DAILY 16 g 5  . hydrochlorothiazide (HYDRODIURIL) 25 MG tablet Take 1 tablet (25 mg total) by mouth daily. 90 tablet 3  . lisinopril (PRINIVIL,ZESTRIL) 10 MG tablet TAKE ONE TABLET BY MOUTH ONCE DAILY 90 tablet 1  . meclizine (ANTIVERT) 12.5 MG tablet Take 1-2 tablets (12.5-25 mg total) by mouth 3 (three) times daily as needed for dizziness. 60 tablet 0  . metoprolol (LOPRESSOR) 50 MG tablet 1/2 tab by mouth twice per day 90 tablet 3  . Omega-3 Fatty Acids (FISH OIL) 1000 MG CAPS Take 1 capsule by mouth daily.    Marland Kitchen omeprazole (PRILOSEC) 20 MG capsule Take 1 capsule by mouth 2 (two) times daily.    . predniSONE (DELTASONE) 10 MG tablet 3 tabs by mouth per day for 3 days,2tabs per day for 3 days,1tab per day for 3 days 18 tablet 0  . rosuvastatin (CRESTOR) 40 MG tablet TAKE ONE TABLET BY MOUTH ONCE DAILY 90 tablet 0  . sildenafil (VIAGRA) 100 MG tablet Take 1 tablet (100 mg total) by mouth as needed for erectile dysfunction. 10 tablet 11  . terazosin (HYTRIN) 5 MG capsule Take 1 capsule by mouth daily.    Marland Kitchen testosterone (ANDROGEL) 50 MG/5GM (1%) GEL Place 10 g onto the skin daily.    Marland Kitchen thiamine 100 MG tablet Take 1 tablet (100 mg  total) by mouth daily. 30 tablet 0   No facility-administered medications prior to visit.       Review of Systems  Constitutional: Negative for chills and fever.  HENT: Positive for congestion and sore throat. Negative for ear discharge and ear pain.   Respiratory: Positive for cough. Negative for shortness of breath.   Neurological: Negative for headaches.      Objective:    BP 110/60 (BP Location: Left Arm,  Patient Position: Sitting, Cuff Size: Large)   Pulse 92   Temp 97.7 F (36.5 C) (Oral)   Resp 16   Ht 5' 9.5" (1.765 m)   Wt 241 lb (109.3 kg)   SpO2 94%   BMI 35.08 kg/m  Nursing note and vital signs reviewed.  Physical Exam  Constitutional: He is oriented to person, place, and time. He appears well-developed and well-nourished. No distress.  HENT:  Right Ear: Hearing, tympanic membrane, external ear and ear canal normal.  Left Ear: Hearing, tympanic membrane, external ear and ear canal normal.  Nose: Nose normal.  Mouth/Throat: Uvula is midline and mucous membranes are normal. Posterior oropharyngeal erythema present.  Cardiovascular: Normal rate, regular rhythm, normal heart sounds and intact distal pulses.   Pulmonary/Chest: Effort normal and breath sounds normal.  Neurological: He is alert and oriented to person, place, and time.  Skin: Skin is warm and dry.  Psychiatric: He has a normal mood and affect. His behavior is normal. Judgment and thought content normal.       Assessment & Plan:   Problem List Items Addressed This Visit      Respiratory   Upper respiratory infection - Primary    Symptoms and exam consistent with acute upper respiratory infection most likely viral at this point. Written prescription for azithromycin provided with instructions for watchful waiting if symptoms worsen. Continue over-the-counter medications as needed for symptom relief and supportive care. Follow-up if symptoms worsen or do not improve.      Relevant Medications   azithromycin (ZITHROMAX) 250 MG tablet    Other Visit Diagnoses    Encounter for immunization       Relevant Orders   Flu vaccine HIGH DOSE PF (Completed)       I am having Mr. Lonardo start on azithromycin. I am also having him maintain his aspirin, sildenafil, ANDROGEL PUMP, Fish Oil, cyclobenzaprine, terazosin, thiamine, metoprolol, fluticasone, rosuvastatin, diclofenac, hydrochlorothiazide, meclizine, omeprazole,  testosterone, predniSONE, and lisinopril.   Meds ordered this encounter  Medications  . azithromycin (ZITHROMAX) 250 MG tablet    Sig: Take 2 tablets by mouth for 1 day and then 1 tablet daily for 4 days    Dispense:  6 tablet    Refill:  0    Order Specific Question:   Supervising Provider    Answer:   Pricilla Holm A [id number]     Follow-up: Return if symptoms worsen or fail to improve.  Mauricio Po, FNP

## 2016-03-20 NOTE — Telephone Encounter (Signed)
Already answered  antibx are not normally refilled medications

## 2016-03-20 NOTE — Assessment & Plan Note (Signed)
Symptoms and exam consistent with acute upper respiratory infection most likely viral at this point. Written prescription for azithromycin provided with instructions for watchful waiting if symptoms worsen. Continue over-the-counter medications as needed for symptom relief and supportive care. Follow-up if symptoms worsen or do not improve.

## 2016-03-20 NOTE — Patient Instructions (Addendum)
Thank you for choosing [facility].  SUMMARY AND INSTRUCTIONS:  Medication:  Your prescription(s) have been submitted to your pharmacy or been printed and provided for you. Please take as directed and contact our office if you believe you are having problem(s) with the medication(s) or have any questions.  Labs:  Please stop by the lab on the lower level of the building for your blood work. Your results will be released to [place] (or called to you) after review, usually within 72 hours after test completion. If any changes need to be made, you will be notified at that same time.  1.) The lab is open from 7:30am to 5:30 pm Monday-Friday 2.) No appointment is necessary 3.) Fasting (if needed) is 6-8 hours after food and drink; black coffee and water are okay   Imaging / Radiology:  Please stop by radiology on the basement level of the building for your x-rays. Your results will be released to [place] (or called to you) after review, usually within 72 hours after test completion. If any treatments or changes are necessary, you will be notified at that same time.  Referrals:  Referrals have been made during this visit. You should expect to hear back from our schedulers in about 7-10 days in regards to establishing an appointment with the specialists we discussed.   Follow up:  If your symptoms worsen or fail to improve, please contact our office for further instruction, or in case of emergency go directly to the emergency room at the closest medical facility.   General Recommendations:    Please drink plenty of fluids.  Get plenty of rest   Sleep in humidified air  Use saline nasal sprays  Netti pot   OTC Medications:  Decongestants - helps relieve congestion   Flonase (generic fluticasone) or Nasacort (generic triamcinolone) - please make sure to use the "cross-over" technique at a 45 degree angle towards the opposite eye as opposed to straight up the nasal  passageway.   Sudafed (generic pseudoephedrine - Note this is the one that is available behind the pharmacy counter); Products with phenylephrine (-PE) may also be used but is often not as effective as pseudoephedrine.   If you have HIGH BLOOD PRESSURE - Coricidin HBP; AVOID any product that is -D as this contains pseudoephedrine which may increase your blood pressure.  Afrin (oxymetazoline) every 6-8 hours for up to 3 days.   Allergies - helps relieve runny nose, itchy eyes and sneezing   Claritin (generic loratidine), Allegra (fexofenidine), or Zyrtec (generic cyrterizine) for runny nose. These medications should not cause drowsiness.  Note - Benadryl (generic diphenhydramine) may be used however may cause drowsiness  Cough -   Delsym or Robitussin (generic dextromethorphan)  Expectorants - helps loosen mucus to ease removal   Mucinex (generic guaifenesin) as directed on the package.  Headaches / General Aches   Tylenol (generic acetaminophen) - DO NOT EXCEED 3 grams (3,000 mg) in a 24 hour time period  Advil/Motrin (generic ibuprofen)   Sore Throat -   Salt water gargle   Chloraseptic (generic benzocaine) spray or lozenges / Sucrets (generic dyclonine)

## 2016-04-08 ENCOUNTER — Other Ambulatory Visit: Payer: Self-pay | Admitting: Internal Medicine

## 2016-04-17 ENCOUNTER — Other Ambulatory Visit: Payer: Self-pay | Admitting: Internal Medicine

## 2016-06-03 ENCOUNTER — Other Ambulatory Visit: Payer: Self-pay | Admitting: Internal Medicine

## 2016-07-20 ENCOUNTER — Other Ambulatory Visit: Payer: Self-pay | Admitting: Internal Medicine

## 2016-07-22 ENCOUNTER — Encounter: Payer: Self-pay | Admitting: Internal Medicine

## 2016-07-22 ENCOUNTER — Ambulatory Visit (INDEPENDENT_AMBULATORY_CARE_PROVIDER_SITE_OTHER): Payer: Medicare Other | Admitting: Internal Medicine

## 2016-07-22 DIAGNOSIS — R05 Cough: Secondary | ICD-10-CM

## 2016-07-22 DIAGNOSIS — R7302 Impaired glucose tolerance (oral): Secondary | ICD-10-CM | POA: Diagnosis not present

## 2016-07-22 DIAGNOSIS — R059 Cough, unspecified: Secondary | ICD-10-CM | POA: Insufficient documentation

## 2016-07-22 DIAGNOSIS — R062 Wheezing: Secondary | ICD-10-CM | POA: Insufficient documentation

## 2016-07-22 DIAGNOSIS — I10 Essential (primary) hypertension: Secondary | ICD-10-CM | POA: Diagnosis not present

## 2016-07-22 MED ORDER — LEVOFLOXACIN 250 MG PO TABS: 250.0000 mg | ORAL_TABLET | Freq: Every day | ORAL | 0 refills | Status: AC

## 2016-07-22 MED ORDER — PREDNISONE 10 MG PO TABS: ORAL_TABLET | ORAL | 0 refills | Status: DC

## 2016-07-22 MED ORDER — HYDROCODONE-HOMATROPINE 5-1.5 MG/5ML PO SYRP: 5.0000 mL | ORAL_SOLUTION | Freq: Four times a day (QID) | ORAL | 0 refills | Status: AC | PRN

## 2016-07-22 NOTE — Assessment & Plan Note (Signed)
stable overall by history and exam, recent data reviewed with pt, and pt to continue medical treatment as before,  to f/u any worsening symptoms or concerns BP Readings from Last 3 Encounters:  07/22/16 140/80  03/20/16 110/60  02/21/16 112/80

## 2016-07-22 NOTE — Assessment & Plan Note (Signed)
Mild, for depomedrol IM 80, predpac asd, cough med prn,  to f/u any worsening symptoms or concerns

## 2016-07-22 NOTE — Progress Notes (Signed)
Pre visit review using our clinic review tool, if applicable. No additional management support is needed unless otherwise documented below in the visit note. 

## 2016-07-22 NOTE — Progress Notes (Signed)
Subjective:    Patient ID: Warren Weeks, male    DOB: 18-Dec-1945, 71 y.o.   MRN: [id number]  HPI   Here with 2-3 days acute onset fever, facial pain, pressure, headache, general weakness and malaise, and greenish d/c, with mild ST and cough, but pt denies chest pain, ncreased sob or doe, orthopnea, PND, increased LE swelling, palpitations, dizziness or syncope.  Today as well with prod cough greenish sputum, sometimes brown as well.  Also with midl wheezing/chest tight since last PM  Pt denies new neurological symptoms such as new headache, or facial or extremity weakness or numbness   Pt denies polydipsia, polyuria  Past Medical History:  Diagnosis Date  . ALLERGIC RHINITIS 09/16/2007  . ALLERGY 03/24/2007  . ANXIETY 03/28/2007  . CAROTID ARTERY STENOSIS, BILATERAL 07/01/2009  . CEPHALGIA 06/08/2009  . CHEST PAIN 09/16/2007  . COLONIC POLYPS, HX OF 09/16/2007  . DEPRESSION 03/28/2007  . Dizziness and giddiness 06/08/2009  . DYSPNEA/SHORTNESS OF BREATH 09/16/2007  . ERECTILE DYSFUNCTION 03/28/2007  . ESOPHAGEAL STRICTURE 09/16/2007  . ESOPHAGITIS 03/24/2007  . GERD 03/24/2007  . GLUCOSE INTOLERANCE 09/16/2007  . HEMORRHOIDS, INTERNAL 03/24/2007  . HYPERCHOLESTEROLEMIA 03/24/2007  . HYPERLIPIDEMIA 09/16/2007  . HYPERTENSION 03/28/2007  . 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 Impaired glucose tolerance 10/04/2010  . LOW BACK PAIN 03/28/2007  . MAGNETIC RESONANCE IMAGING, BRAIN, ABNORMAL 06/26/2009  . NECK PAIN, LEFT 06/20/2010  . OBESITY 03/24/2007  . OTITIS MEDIA, LEFT 06/10/2009  . Palpitations 06/20/2010  . PVD 06/25/2009  . RASH-NONVESICULAR 06/17/2009  . URI 08/16/2009   Past Surgical History:  Procedure Laterality Date  . s/p right knee arthroscopy    . TONSILLECTOMY      reports that he has quit smoking. He has never used smokeless tobacco. He reports that he drinks about 4.2 oz of alcohol per week . He reports that he does not use drugs. family history includes Coronary artery disease in his  brother and sister; Heart disease in his father; Lung cancer in his sister; Lymphoma in his mother; Ovarian cancer in his sister. Allergies  Allergen Reactions  . Cefuroxime Axetil     Rash    Current Outpatient Prescriptions on File Prior to Visit  Medication Sig Dispense Refill  . ANDROGEL PUMP 20.25 MG/ACT (1.62%) GEL APPLY 2 PUMPS ONTO SKIN DAILY 75 g 0  . aspirin (ECOTRIN LOW STRENGTH) 81 MG EC tablet Take 81 mg by mouth daily.      Marland Kitchen azithromycin (ZITHROMAX) 250 MG tablet Take 2 tablets by mouth for 1 day and then 1 tablet daily for 4 days 6 tablet 0  . cyclobenzaprine (FLEXERIL) 5 MG tablet Take 1 tablet (5 mg total) by mouth 3 (three) times daily as needed for muscle spasms. 60 tablet 1  . diclofenac (VOLTAREN) 75 MG EC tablet TAKE ONE TABLET BY MOUTH AS NEEDED 60 tablet 0  . fluticasone (FLONASE) 50 MCG/ACT nasal spray USE TWO SPRAY(S) IN EACH NOSTRIL ONCE DAILY 16 g 5  . hydrochlorothiazide (HYDRODIURIL) 25 MG tablet Take 1 tablet (25 mg total) by mouth daily. 90 tablet 3  . lisinopril (PRINIVIL,ZESTRIL) 10 MG tablet TAKE ONE TABLET BY MOUTH ONCE DAILY 90 tablet 1  . meclizine (ANTIVERT) 12.5 MG tablet Take 1-2 tablets (12.5-25 mg total) by mouth 3 (three) times daily as needed for dizziness. 60 tablet 0  . metoprolol (LOPRESSOR) 50 MG tablet 1/2 tab by mouth twice per day 90 tablet 3  . Omega-3 Fatty Acids (FISH OIL) 1000  MG CAPS Take 1 capsule by mouth daily.    Marland Kitchen omeprazole (PRILOSEC) 20 MG capsule Take 1 capsule by mouth 2 (two) times daily.    Marland Kitchen omeprazole (PRILOSEC) 20 MG capsule TAKE ONE CAPSULE BY MOUTH TWICE DAILY 180 capsule 0  . predniSONE (DELTASONE) 10 MG tablet 3 tabs by mouth per day for 3 days,2tabs per day for 3 days,1tab per day for 3 days 18 tablet 0  . rosuvastatin (CRESTOR) 40 MG tablet TAKE ONE TABLET BY MOUTH ONCE DAILY 90 tablet 0  . sildenafil (VIAGRA) 100 MG tablet Take 1 tablet (100 mg total) by mouth as needed for erectile dysfunction. 10 tablet 11  .  terazosin (HYTRIN) 5 MG capsule Take 1 capsule by mouth daily.    Marland Kitchen testosterone (ANDROGEL) 50 MG/5GM (1%) GEL Place 10 g onto the skin daily.    Marland Kitchen thiamine 100 MG tablet Take 1 tablet (100 mg total) by mouth daily. 30 tablet 0   No current facility-administered medications on file prior to visit.    Review of Systems  Constitutional: Negative for unusual diaphoresis or night sweats HENT: Negative for ear swelling or discharge Eyes: Negative for worsening visual haziness  Respiratory: Negative for choking and stridor.   Gastrointestinal: Negative for distension or worsening eructation Genitourinary: Negative for retention or change in urine volume.  Musculoskeletal: Negative for other MSK pain or swelling Skin: Negative for color change and worsening wound Neurological: Negative for tremors and numbness other than noted  Psychiatric/Behavioral: Negative for decreased concentration or agitation other than above   All other system neg per pt    Objective:   Physical Exam BP 140/80   Pulse 94   Temp 98.8 F (37.1 C) (Oral)   Resp 20   SpO2 96%  VS noted, mild ill Constitutional: Pt appears in no apparent distress HENT: Head: NCAT.  Right Ear: External ear normal.  Left Ear: External ear normal.  Bilat tm's with mild erythema.  Max sinus areas mild tender.  Pharynx with mild erythema, no exudate Eyes: . Pupils are equal, round, and reactive to light. Conjunctivae and EOM are normal Neck: Normal range of motion. Neck supple.  Cardiovascular: Normal rate and regular rhythm.   Pulmonary/Chest: Effort normal and breath sounds decreased without rales but with few scattered wheezing.  Neurological: Pt is alert. Not confused , motor grossly intact Skin: Skin is warm. No rash, no LE edema Psychiatric: Pt behavior is normal. No agitation.  No other new exam findings    Assessment & Plan:

## 2016-07-22 NOTE — Patient Instructions (Signed)
You had the steroid shot today  Please take all new medication as prescribed - the antibiotic, cough medicine if needed, and prednsone   Please continue all other medications as before, and refills have been done if requested.  Please have the pharmacy call with any other refills you may need.  Please keep your appointments with your specialists as you may have planned

## 2016-07-22 NOTE — Assessment & Plan Note (Signed)
Mild to mod, c/w bronchitis vs pna, declines cxr, for antibx course, cough med prn,  to f/u any worsening symptoms or concerns 

## 2016-07-22 NOTE — Assessment & Plan Note (Signed)
stable overall by history and exam, recent data reviewed with pt, and pt to continue medical treatment as before,  to f/u any worsening symptoms or concerns Lab Results  Component Value Date   HGBA1C 6.1 (H) 12/23/2015   Pt to call for onset polys or cbg > 200 with tx

## 2016-08-24 ENCOUNTER — Other Ambulatory Visit: Payer: Self-pay | Admitting: Internal Medicine

## 2016-09-07 ENCOUNTER — Other Ambulatory Visit: Payer: Self-pay | Admitting: Internal Medicine

## 2016-10-13 ENCOUNTER — Ambulatory Visit (INDEPENDENT_AMBULATORY_CARE_PROVIDER_SITE_OTHER): Payer: Medicare Other | Admitting: Internal Medicine

## 2016-10-13 ENCOUNTER — Encounter: Payer: Self-pay | Admitting: Internal Medicine

## 2016-10-13 VITALS — BP 140/70 | HR 62 | Ht 69.5 in | Wt 251.0 lb

## 2016-10-13 DIAGNOSIS — M545 Low back pain: Secondary | ICD-10-CM

## 2016-10-13 DIAGNOSIS — R059 Cough, unspecified: Secondary | ICD-10-CM

## 2016-10-13 DIAGNOSIS — M79606 Pain in leg, unspecified: Secondary | ICD-10-CM

## 2016-10-13 DIAGNOSIS — R05 Cough: Secondary | ICD-10-CM | POA: Diagnosis not present

## 2016-10-13 DIAGNOSIS — G4700 Insomnia, unspecified: Secondary | ICD-10-CM | POA: Diagnosis not present

## 2016-10-13 MED ORDER — LEVOFLOXACIN 500 MG PO TABS: 500.0000 mg | ORAL_TABLET | Freq: Every day | ORAL | 0 refills | Status: AC

## 2016-10-13 NOTE — Progress Notes (Signed)
Subjective:    Patient ID: Warren Weeks, male    DOB: 1945/07/26, 71 y.o.   MRN: [id number]  HPI  Here with acute onset mild to mod 2-3 days ST, HA, general weakness and malaise, with prod cough greenish sputum, but Pt denies chest pain, increased sob or doe, wheezing, orthopnea, PND, increased LE swelling, palpitations, dizziness or syncope, though didhave some rattling in the chest with sleep last PM.  Also with difficulty sleeping such as getting to sleep most nights in last several weeks.  Also c/o 2-3 mo worsening LBP with ambulation, mild to mod, worse to ambulate only 200 ft with now reproducible pain radiating to both legs, tingling in feet, and bilat leg weakness.  No falls.  Has not had prior imaging or evaluation.  Not sure he wants to do so now, as he only wanted to mention today, fearing something serious. No hx of lumbar stenosis or neurogenic claudication.   Has gained wt due to less able to walk and exercise. Past Medical History:  Diagnosis Date  . ALLERGIC RHINITIS 09/16/2007  . ALLERGY 03/24/2007  . ANXIETY 03/28/2007  . CAROTID ARTERY STENOSIS, BILATERAL 07/01/2009  . CEPHALGIA 06/08/2009  . CHEST PAIN 09/16/2007  . COLONIC POLYPS, HX OF 09/16/2007  . DEPRESSION 03/28/2007  . Dizziness and giddiness 06/08/2009  . DYSPNEA/SHORTNESS OF BREATH 09/16/2007  . ERECTILE DYSFUNCTION 03/28/2007  . ESOPHAGEAL STRICTURE 09/16/2007  . ESOPHAGITIS 03/24/2007  . GERD 03/24/2007  . GLUCOSE INTOLERANCE 09/16/2007  . HEMORRHOIDS, INTERNAL 03/24/2007  . HYPERCHOLESTEROLEMIA 03/24/2007  . HYPERLIPIDEMIA 09/16/2007  . HYPERTENSION 03/28/2007  . 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 Impaired glucose tolerance 10/04/2010  . LOW BACK PAIN 03/28/2007  . MAGNETIC RESONANCE IMAGING, BRAIN, ABNORMAL 06/26/2009  . NECK PAIN, LEFT 06/20/2010  . OBESITY 03/24/2007  . OTITIS MEDIA, LEFT 06/10/2009  . Palpitations 06/20/2010  . PVD 06/25/2009  . RASH-NONVESICULAR 06/17/2009  . URI 08/16/2009   Past Surgical History:    Procedure Laterality Date  . s/p right knee arthroscopy    . TONSILLECTOMY      reports that he has quit smoking. He has never used smokeless tobacco. He reports that he drinks about 4.2 oz of alcohol per week . He reports that he does not use drugs. family history includes Coronary artery disease in his brother and sister; Heart disease in his father; Lung cancer in his sister; Lymphoma in his mother; Ovarian cancer in his sister. Allergies  Allergen Reactions  . Cefuroxime Axetil     Rash    Current Outpatient Prescriptions on File Prior to Visit  Medication Sig Dispense Refill  . ANDROGEL PUMP 20.25 MG/ACT (1.62%) GEL APPLY 2 PUMPS ONTO SKIN DAILY 75 g 0  . aspirin (ECOTRIN LOW STRENGTH) 81 MG EC tablet Take 81 mg by mouth daily.      . cyclobenzaprine (FLEXERIL) 5 MG tablet Take 1 tablet (5 mg total) by mouth 3 (three) times daily as needed for muscle spasms. 60 tablet 1  . diclofenac (VOLTAREN) 75 MG EC tablet TAKE ONE TABLET BY MOUTH ONCE DAILY AS NEEDED 180 tablet 0  . fluticasone (FLONASE) 50 MCG/ACT nasal spray USE TWO SPRAY(S) IN EACH NOSTRIL ONCE DAILY 16 g 5  . hydrochlorothiazide (HYDRODIURIL) 25 MG tablet Take 1 tablet (25 mg total) by mouth daily. 90 tablet 3  . lisinopril (PRINIVIL,ZESTRIL) 10 MG tablet TAKE ONE TABLET BY MOUTH ONCE DAILY 90 tablet 1  . meclizine (ANTIVERT) 12.5 MG tablet Take 1-2 tablets (12.5-25 mg  total) by mouth 3 (three) times daily as needed for dizziness. 60 tablet 0  . metoprolol (LOPRESSOR) 50 MG tablet 1/2 tab by mouth twice per day 90 tablet 3  . Omega-3 Fatty Acids (FISH OIL) 1000 MG CAPS Take 1 capsule by mouth daily.    Marland Kitchen omeprazole (PRILOSEC) 20 MG capsule Take 1 capsule by mouth 2 (two) times daily.    . rosuvastatin (CRESTOR) 40 MG tablet TAKE ONE TABLET BY MOUTH ONCE DAILY 90 tablet 0  . sildenafil (VIAGRA) 100 MG tablet Take 1 tablet (100 mg total) by mouth as needed for erectile dysfunction. 10 tablet 11  . terazosin (HYTRIN) 5 MG  capsule Take 1 capsule by mouth daily.    Marland Kitchen testosterone (ANDROGEL) 50 MG/5GM (1%) GEL Place 10 g onto the skin daily.    Marland Kitchen thiamine 100 MG tablet Take 1 tablet (100 mg total) by mouth daily. 30 tablet 0   No current facility-administered medications on file prior to visit.    Review of Systems  Constitutional: Negative for other unusual diaphoresis or sweats HENT: Negative for ear discharge or swelling Eyes: Negative for other worsening visual disturbances Respiratory: Negative for stridor or other swelling  Gastrointestinal: Negative for worsening distension or other blood Genitourinary: Negative for retention or other urinary change Musculoskeletal: Negative for other MSK pain or swelling Skin: Negative for color change or other new lesions Neurological: Negative for worsening tremors and other numbness  Psychiatric/Behavioral: Negative for worsening agitation or other fatigue All other system neg per pt    Objective:   Physical Exam BP 140/70   Pulse 62   Ht 5' 9.5" (1.765 m)   Wt 251 lb (113.9 kg)   SpO2 98%   BMI 36.53 kg/m  VS noted, mild ill appearing Constitutional: Pt appears in NAD HENT: Head: NCAT.  Right Ear: External ear normal.  Left Ear: External ear normal.  Eyes: . Pupils are equal, round, and reactive to light. Conjunctivae and EOM are normal Bilat tm's with mild erythema.  Max sinus areas non tender.  Pharynx with mild erythema, no exudate Nose: without d/c or deformity Neck: Neck supple. Gross normal ROM Cardiovascular: Normal rate and regular rhythm.   Pulmonary/Chest: Effort normal and breath sounds without rales or wheezing.  Abd:  Soft, NT, ND, + BS, no organomegaly Spine nontender midline lumbar Neurological: Pt is alert. At baseline orientation, motor 5/5 intact Skin: Skin is warm. No rashes, other new lesions, no LE edema Psychiatric: Pt behavior is normal without agitation  No other exam findings    Assessment & Plan:

## 2016-10-13 NOTE — Assessment & Plan Note (Signed)
Mild to mod, c/w bornchitis vs pna, declines cxr, for antibx course, cough med prn,  to f/u any worsening symptoms or concerns °

## 2016-10-13 NOTE — Assessment & Plan Note (Signed)
D/w pt, ok for benedryl 25 mg otc qhs prn

## 2016-10-13 NOTE — Patient Instructions (Signed)
Please take all new medication as prescribed - the antibiotic  OK to try the OTC Benadryl at 25 to 50 mg nightly for sleep  Please call if you change your mind about the MRI  Please continue all other medications as before, and refills have been done if requested.  Please have the pharmacy call with any other refills you may need.  Please keep your appointments with your specialists as you may have planned

## 2016-10-13 NOTE — Assessment & Plan Note (Signed)
Only occurs with ambulation reproducibly, exam benign, but likely c/w possible lumbar stenosis and neurogenic claudication, d/w pt but he declines MRI or surgical evaluation at this time

## 2016-10-13 NOTE — Progress Notes (Signed)
Pre visit review using our clinic review tool, if applicable. No additional management support is needed unless otherwise documented below in the visit note. 

## 2016-10-14 ENCOUNTER — Other Ambulatory Visit: Payer: Self-pay | Admitting: Internal Medicine

## 2016-11-20 ENCOUNTER — Other Ambulatory Visit (INDEPENDENT_AMBULATORY_CARE_PROVIDER_SITE_OTHER): Payer: Medicare Other

## 2016-11-20 ENCOUNTER — Ambulatory Visit (INDEPENDENT_AMBULATORY_CARE_PROVIDER_SITE_OTHER): Payer: Medicare Other | Admitting: Internal Medicine

## 2016-11-20 VITALS — BP 126/74 | HR 77 | Ht 69.0 in | Wt 251.0 lb

## 2016-11-20 DIAGNOSIS — E785 Hyperlipidemia, unspecified: Secondary | ICD-10-CM | POA: Diagnosis not present

## 2016-11-20 DIAGNOSIS — M79604 Pain in right leg: Secondary | ICD-10-CM

## 2016-11-20 DIAGNOSIS — Z0000 Encounter for general adult medical examination without abnormal findings: Secondary | ICD-10-CM

## 2016-11-20 DIAGNOSIS — E291 Testicular hypofunction: Secondary | ICD-10-CM

## 2016-11-20 DIAGNOSIS — R7302 Impaired glucose tolerance (oral): Secondary | ICD-10-CM

## 2016-11-20 DIAGNOSIS — R609 Edema, unspecified: Secondary | ICD-10-CM

## 2016-11-20 DIAGNOSIS — I6523 Occlusion and stenosis of bilateral carotid arteries: Secondary | ICD-10-CM | POA: Diagnosis not present

## 2016-11-20 DIAGNOSIS — M79605 Pain in left leg: Secondary | ICD-10-CM

## 2016-11-20 DIAGNOSIS — Z0001 Encounter for general adult medical examination with abnormal findings: Secondary | ICD-10-CM | POA: Diagnosis not present

## 2016-11-20 DIAGNOSIS — M545 Low back pain: Secondary | ICD-10-CM

## 2016-11-20 LAB — HEPATIC FUNCTION PANEL
ALT: 43 U/L (ref 0–53)
AST: 33 U/L (ref 0–37)
Albumin: 4.4 g/dL (ref 3.5–5.2)
Alkaline Phosphatase: 65 U/L (ref 39–117)
BILIRUBIN DIRECT: 0.1 mg/dL (ref 0.0–0.3)
BILIRUBIN TOTAL: 0.7 mg/dL (ref 0.2–1.2)
Total Protein: 7.1 g/dL (ref 6.0–8.3)

## 2016-11-20 LAB — BASIC METABOLIC PANEL
BUN: 18 mg/dL (ref 6–23)
CHLORIDE: 102 meq/L (ref 96–112)
CO2: 29 mEq/L (ref 19–32)
Calcium: 9.5 mg/dL (ref 8.4–10.5)
Creatinine, Ser: 1.29 mg/dL (ref 0.40–1.50)
GFR: 71.13 mL/min (ref >60.00)
Glucose, Bld: 104 mg/dL — ABNORMAL HIGH (ref 70–99)
Potassium: 4.2 mEq/L (ref 3.5–5.1)
Sodium: 139 mEq/L (ref 135–145)

## 2016-11-20 LAB — PSA: PSA: 0.28 ng/mL (ref 0.10–4.00)

## 2016-11-20 LAB — URINALYSIS, ROUTINE W REFLEX MICROSCOPIC
Bilirubin Urine: NEGATIVE
Hgb urine dipstick: NEGATIVE
KETONES UR: NEGATIVE
Leukocytes, UA: NEGATIVE
Nitrite: NEGATIVE
PH: 6 (ref 5.0–8.0)
RBC / HPF: NONE SEEN (ref 0–?)
SPECIFIC GRAVITY, URINE: 1.01 (ref 1.000–1.030)
TOTAL PROTEIN, URINE-UPE24: NEGATIVE
Urine Glucose: NEGATIVE
Urobilinogen, UA: 0.2 (ref 0.0–1.0)
WBC UA: NONE SEEN (ref 0–?)

## 2016-11-20 LAB — LIPID PANEL
CHOL/HDL RATIO: 3
Cholesterol: 174 mg/dL (ref 0–200)
HDL: 55.3 mg/dL (ref >39.00)
LDL CALC: 97 mg/dL (ref 0–99)
NONHDL: 118.98
Triglycerides: 110 mg/dL (ref 0.0–149.0)
VLDL: 22 mg/dL (ref 0.0–40.0)

## 2016-11-20 LAB — CBC WITH DIFFERENTIAL/PLATELET
BASOS PCT: 0.7 % (ref 0.0–3.0)
Basophils Absolute: 0 10*3/uL (ref 0.0–0.1)
EOS ABS: 0.1 10*3/uL (ref 0.0–0.7)
EOS PCT: 1 % (ref 0.0–5.0)
HEMATOCRIT: 47.8 % (ref 39.0–52.0)
HEMOGLOBIN: 16.2 g/dL (ref 13.0–17.0)
LYMPHS PCT: 27.4 % (ref 12.0–46.0)
Lymphs Abs: 1.6 10*3/uL (ref 0.7–4.0)
MCHC: 33.8 g/dL (ref 30.0–36.0)
MCV: 91.5 fl (ref 78.0–100.0)
MONOS PCT: 9.5 % (ref 3.0–12.0)
Monocytes Absolute: 0.5 10*3/uL (ref 0.1–1.0)
NEUTROS ABS: 3.5 10*3/uL (ref 1.4–7.7)
Neutrophils Relative %: 61.4 % (ref 43.0–77.0)
Platelets: 156 10*3/uL (ref 150.0–400.0)
RBC: 5.22 Mil/uL (ref 4.22–5.81)
RDW: 14.6 % (ref 11.5–15.5)
WBC: 5.8 10*3/uL (ref 4.0–10.5)

## 2016-11-20 LAB — HEMOGLOBIN A1C: Hgb A1c MFr Bld: 6.4 % (ref 4.6–6.5)

## 2016-11-20 LAB — TSH: TSH: 2.6 u[IU]/mL (ref 0.35–4.50)

## 2016-11-20 LAB — TESTOSTERONE: TESTOSTERONE: 525.84 ng/dL (ref 300.00–890.00)

## 2016-11-20 LAB — BRAIN NATRIURETIC PEPTIDE: PRO B NATRI PEPTIDE: 26 pg/mL (ref 0.0–100.0)

## 2016-11-20 MED ORDER — CYCLOBENZAPRINE HCL 5 MG PO TABS: 5.0000 mg | ORAL_TABLET | Freq: Three times a day (TID) | ORAL | 1 refills | Status: DC | PRN

## 2016-11-20 NOTE — Patient Instructions (Signed)
Your EKG was OK today  Please continue all other medications as before, and refills have been done if requested.  Please have the pharmacy call with any other refills you may need.  Please continue your efforts at being more active, low cholesterol diet, and weight control.  You are otherwise up to date with prevention measures today.  Please keep your appointments with your specialists as you may have planned  You will be contacted regarding the referral for: Orthopedic, Lumbar MRI, carotid test follow up, leg circulation test, and Echocardiogram  Please go to the LAB in the Basement (turn left off the elevator) for the tests to be done today  You will be contacted by phone if any changes need to be made immediately.  Otherwise, you will receive a letter about your results with an explanation, but please check with MyChart first.  Please remember to sign up for MyChart if you have not done so, as this will be important to you in the future with finding out test results, communicating by private email, and scheduling acute appointments online when needed.  Please return in 6 months, or sooner if needed, with Lab testing done 3-5 days before

## 2016-11-20 NOTE — Progress Notes (Signed)
Subjective:    Patient ID: Warren Weeks, male    DOB: 10-14-1945, 71 y.o.   MRN: [id number]  HPI Here for wellness and f/u;  Overall doing ok;  Pt denies Chest pain, worsening SOB, DOE, wheezing, orthopnea, PND, worsening LE edema, palpitations, dizziness or syncope.  Pt denies neurological change such as new headache, facial or extremity weakness.  Pt denies polydipsia, polyuria, or low sugar symptoms. Pt states overall good compliance with treatment and medications, good tolerability, and has been trying to follow appropriate diet.  Pt denies worsening depressive symptoms, suicidal ideation or panic. No fever, night sweats, wt loss, loss of appetite, or other constitutional symptoms.  Pt states good ability with ADL's, has low fall risk, home safety reviewed and adequate, no other significant changes in hearing or vision, and only occasionally active with exercise. Due to worsening orthopedic pain. Gained 10 lbs with less activity this past yr.related to left knee pain. Has seen ortho - dx with DJD and may eventually need TKA.  Also Pt continues to have recurring LBP without change in severity, bowel or bladder change, fever, wt loss, gait change or falls, but has had LLE more than RLE pain, numbness , swelling, but no weakness or falls.  Occurs mild, in termittent, daily, and worse after sitting for some time.  .  Pain and numbness worse with walking as well.  Had MRI ordered 2016 but not done.   Wt Readings from Last 3 Encounters:  11/20/16 251 lb (113.9 kg)  10/13/16 251 lb (113.9 kg)  03/20/16 241 lb (109.3 kg)  Melatonin not working for sleep every night, last night up to 3AM.  Snores at night, right nasal always clogs up, tired most days, has to get up for water with dry mouth breatthing.  Does not want further med or sleep study for now  Also with mild worsening LE edema  Due for carotid f/u by end of this month Past Medical History:  Diagnosis Date  . ALLERGIC RHINITIS 09/16/2007  . ALLERGY  03/24/2007  . ANXIETY 03/28/2007  . CAROTID ARTERY STENOSIS, BILATERAL 07/01/2009  . CEPHALGIA 06/08/2009  . CHEST PAIN 09/16/2007  . COLONIC POLYPS, HX OF 09/16/2007  . DEPRESSION 03/28/2007  . Dizziness and giddiness 06/08/2009  . DYSPNEA/SHORTNESS OF BREATH 09/16/2007  . ERECTILE DYSFUNCTION 03/28/2007  . ESOPHAGEAL STRICTURE 09/16/2007  . ESOPHAGITIS 03/24/2007  . GERD 03/24/2007  . GLUCOSE INTOLERANCE 09/16/2007  . HEMORRHOIDS, INTERNAL 03/24/2007  . HYPERCHOLESTEROLEMIA 03/24/2007  . HYPERLIPIDEMIA 09/16/2007  . HYPERTENSION 03/28/2007  . 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 Impaired glucose tolerance 10/04/2010  . LOW BACK PAIN 03/28/2007  . MAGNETIC RESONANCE IMAGING, BRAIN, ABNORMAL 06/26/2009  . NECK PAIN, LEFT 06/20/2010  . OBESITY 03/24/2007  . OTITIS MEDIA, LEFT 06/10/2009  . Palpitations 06/20/2010  . PVD 06/25/2009  . RASH-NONVESICULAR 06/17/2009  . URI 08/16/2009   Past Surgical History:  Procedure Laterality Date  . s/p right knee arthroscopy    . TONSILLECTOMY      reports that he has quit smoking. He has never used smokeless tobacco. He reports that he drinks about 4.2 oz of alcohol per week . He reports that he does not use drugs. family history includes Coronary artery disease in his brother and sister; Heart disease in his father; Lung cancer in his sister; Lymphoma in his mother; Ovarian cancer in his sister. Allergies  Allergen Reactions  . Cefuroxime Axetil     Rash    Current Outpatient Prescriptions on File  Prior to Visit  Medication Sig Dispense Refill  . aspirin (ECOTRIN LOW STRENGTH) 81 MG EC tablet Take 81 mg by mouth daily.      . diclofenac (VOLTAREN) 75 MG EC tablet TAKE ONE TABLET BY MOUTH ONCE DAILY AS NEEDED 180 tablet 0  . fluticasone (FLONASE) 50 MCG/ACT nasal spray USE TWO SPRAY(S) IN EACH NOSTRIL ONCE DAILY 16 g 5  . hydrochlorothiazide (HYDRODIURIL) 25 MG tablet Take 1 tablet (25 mg total) by mouth daily. 90 tablet 3  . lisinopril (PRINIVIL,ZESTRIL) 10 MG  tablet TAKE ONE TABLET BY MOUTH ONCE DAILY 90 tablet 1  . meclizine (ANTIVERT) 12.5 MG tablet Take 1-2 tablets (12.5-25 mg total) by mouth 3 (three) times daily as needed for dizziness. 60 tablet 0  . metoprolol (LOPRESSOR) 50 MG tablet 1/2 tab by mouth twice per day 90 tablet 3  . Omega-3 Fatty Acids (FISH OIL) 1000 MG CAPS Take 1 capsule by mouth daily.    Marland Kitchen omeprazole (PRILOSEC) 20 MG capsule Take 1 capsule by mouth 2 (two) times daily.    . rosuvastatin (CRESTOR) 40 MG tablet TAKE ONE TABLET BY MOUTH ONCE DAILY 90 tablet 0  . sildenafil (VIAGRA) 100 MG tablet Take 1 tablet (100 mg total) by mouth as needed for erectile dysfunction. 10 tablet 11  . terazosin (HYTRIN) 5 MG capsule Take 1 capsule by mouth daily.    Marland Kitchen thiamine 100 MG tablet Take 1 tablet (100 mg total) by mouth daily. 30 tablet 0   No current facility-administered medications on file prior to visit.    Review of Systems Constitutional: Negative for other unusual diaphoresis, sweats, appetite or weight changes HENT: Negative for other worsening hearing loss, ear pain, facial swelling, mouth sores or neck stiffness.   Eyes: Negative for other worsening pain, redness or other visual disturbance.  Respiratory: Negative for other stridor or swelling Cardiovascular: Negative for other palpitations or other chest pain  Gastrointestinal: Negative for worsening diarrhea or loose stools, blood in stool, distention or other pain Genitourinary: Negative for hematuria, flank pain or other change in urine volume.  Musculoskeletal: Negative for myalgias or other joint swelling.  Skin: Negative for other color change, or other wound or worsening drainage.  Neurological: Negative for other syncope or numbness. Hematological: Negative for other adenopathy or swelling Psychiatric/Behavioral: Negative for hallucinations, other worsening agitation, SI, self-injury, or new decreased concentration All other system neg per pt    Objective:    Physical Exam BP 126/74   Pulse 77   Ht 5\' 9"  (1.753 m)   Wt 251 lb (113.9 kg)   SpO2 98%   BMI 37.07 kg/m  VS noted,  Constitutional: Pt is oriented to person, place, and time. Appears well-developed and well-nourished, in no significant distress and comfortable Head: Normocephalic and atraumatic  Eyes: Conjunctivae and EOM are normal. Pupils are equal, round, and reactive to light Right Ear: External ear normal without discharge Left Ear: External ear normal without discharge Nose: Nose without discharge or deformity Mouth/Throat: Oropharynx is without other ulcerations and moist  Neck: Normal range of motion. Neck supple. No JVD present. No tracheal deviation present or significant neck LA or mass Cardiovascular: Normal rate, regular rhythm, normal heart sounds and intact distal pulses.   Pulmonary/Chest: WOB normal and breath sounds without rales or wheezing  Abdominal: Soft. Bowel sounds are normal. NT. No HSM  Musculoskeletal: Normal range of motion. Lymphadenopathy: Has no other cervical adenopathy.  Neurological: Pt is alert and oriented to person, place, and  time. Pt has normal reflexes. No cranial nerve deficit. Motor grossly intact, Gait intact Skin: Skin is warm and dry. No rash noted or new ulcerations Psychiatric:  Has normal mood and affect. Behavior is normal without agitation 1+ bilat LE edema  ECG today I have personally interpreted: NSR - nonspecific change ST T wave and poor baseline inferiorly    Assessment & Plan:

## 2016-11-22 DIAGNOSIS — M79605 Pain in left leg: Secondary | ICD-10-CM

## 2016-11-22 DIAGNOSIS — M79604 Pain in right leg: Secondary | ICD-10-CM | POA: Insufficient documentation

## 2016-11-22 NOTE — Assessment & Plan Note (Addendum)
Chronic stable, for leg elevation, compression stocking, low salt diet, ecg reviewed, for echo r/o diast or sys dysfxn, cont hct

## 2016-11-22 NOTE — Assessment & Plan Note (Signed)
stable overall by history and exam, recent data reviewed with pt, and pt to continue medical treatment as before,  to f/u any worsening symptoms or concerns Lab Results  Component Value Date   LDLCALC 97 11/20/2016

## 2016-11-22 NOTE — Assessment & Plan Note (Signed)
For f/u study later this month

## 2016-11-22 NOTE — Assessment & Plan Note (Signed)
Also for f/u lab, cont same tx

## 2016-11-22 NOTE — Assessment & Plan Note (Signed)

## 2016-11-22 NOTE — Assessment & Plan Note (Signed)
C/w LBP with radiation to bilat LE's; for LE dopplers, but also MRI LS spine and referral ortho

## 2016-11-22 NOTE — Assessment & Plan Note (Signed)
stable overall by history and exam, recent data reviewed with pt, and pt to continue medical treatment as before,  to f/u any worsening symptoms or concerns Lab Results  Component Value Date   HGBA1C 6.4 11/20/2016   

## 2016-11-24 ENCOUNTER — Other Ambulatory Visit: Payer: Self-pay | Admitting: Internal Medicine

## 2016-11-30 ENCOUNTER — Other Ambulatory Visit: Payer: Self-pay | Admitting: Internal Medicine

## 2016-11-30 DIAGNOSIS — M79604 Pain in right leg: Secondary | ICD-10-CM

## 2016-11-30 DIAGNOSIS — M79605 Pain in left leg: Principal | ICD-10-CM

## 2016-12-06 ENCOUNTER — Ambulatory Visit
Admission: RE | Admit: 2016-12-06 | Discharge: 2016-12-06 | Disposition: A | Discharge status: Home / Self Care | Payer: Medicare Other | Source: Ambulatory Visit | Attending: Internal Medicine | Admitting: Internal Medicine

## 2016-12-06 DIAGNOSIS — M545 Low back pain: Secondary | ICD-10-CM

## 2016-12-06 DIAGNOSIS — M79604 Pain in right leg: Secondary | ICD-10-CM

## 2016-12-08 ENCOUNTER — Ambulatory Visit (HOSPITAL_COMMUNITY): Payer: Medicare Other | Attending: Cardiovascular Disease

## 2016-12-08 ENCOUNTER — Other Ambulatory Visit: Payer: Self-pay

## 2016-12-08 DIAGNOSIS — I5031 Acute diastolic (congestive) heart failure: Secondary | ICD-10-CM

## 2016-12-08 DIAGNOSIS — R609 Edema, unspecified: Secondary | ICD-10-CM | POA: Diagnosis present

## 2016-12-08 DIAGNOSIS — I5030 Unspecified diastolic (congestive) heart failure: Secondary | ICD-10-CM | POA: Diagnosis not present

## 2016-12-10 ENCOUNTER — Other Ambulatory Visit: Payer: Self-pay | Admitting: Internal Medicine

## 2016-12-11 ENCOUNTER — Ambulatory Visit (HOSPITAL_COMMUNITY)
Admission: RE | Admit: 2016-12-11 | Discharge: 2016-12-11 | Disposition: A | Discharge status: Home / Self Care | Payer: Medicare Other | Source: Ambulatory Visit | Attending: Internal Medicine | Admitting: Internal Medicine

## 2016-12-11 DIAGNOSIS — M79605 Pain in left leg: Secondary | ICD-10-CM | POA: Insufficient documentation

## 2016-12-11 DIAGNOSIS — M79604 Pain in right leg: Secondary | ICD-10-CM | POA: Diagnosis not present

## 2016-12-11 DIAGNOSIS — R0989 Other specified symptoms and signs involving the circulatory and respiratory systems: Secondary | ICD-10-CM | POA: Diagnosis not present

## 2016-12-11 DIAGNOSIS — I739 Peripheral vascular disease, unspecified: Secondary | ICD-10-CM | POA: Diagnosis not present

## 2016-12-15 ENCOUNTER — Ambulatory Visit (INDEPENDENT_AMBULATORY_CARE_PROVIDER_SITE_OTHER): Payer: Medicare Other | Admitting: Orthopaedic Surgery

## 2016-12-15 DIAGNOSIS — M5416 Radiculopathy, lumbar region: Secondary | ICD-10-CM | POA: Diagnosis not present

## 2016-12-15 NOTE — Progress Notes (Signed)
Office Visit Note   Patient: Warren Weeks           Date of Birth: [birth year]           MRN: [id number] Visit Date: 12/15/2016              Requested by: Biagio Borg, MD [address] [place], [place] [address] PCP: Biagio Borg, MD   Assessment & Plan: Visit Diagnoses:  1. 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 Referred to Dr. Ernestina Patches for epidural steroid injection. Follow up with me as needed.  Follow-Up Instructions: Return if symptoms worsen or fail to improve.   Orders:  No orders of the defined types were placed in this encounter.  No orders of the defined types were placed in this encounter.     Procedures: No procedures performed   Clinical Data: No additional findings.   Subjective: No chief complaint on file.   Patient is 71 year old gentleman who comes in for low back pain with bilateral sciatica and radiation of pain into his hips. He is limited with ambulation because of the pain. Denies any bowel or bladder dysfunction. He denies numbness and tingling. He denies any groin pain. Lateral hips are nontender to touch.    Review of Systems  Constitutional: Negative.   All other systems reviewed and are negative.    Objective: Vital Signs: There were no vitals taken for this visit.  Physical Exam  Constitutional: He is oriented to person, place, and time. He appears well-developed and well-nourished.  HENT:  Head: Normocephalic and atraumatic.  Eyes: Pupils are equal, round, and reactive to light.  Neck: Neck supple.  Pulmonary/Chest: Effort normal.  Abdominal: Soft.  Musculoskeletal: Normal range of motion.  Neurological: He is alert and oriented to person, place, and time.  Skin: Skin is warm.  Psychiatric: He has a normal mood and affect. His behavior is normal. Judgment and thought content normal.  Nursing note and vitals reviewed.   Ortho Exam Bilateral lower  extremity exam shows painless range of motion of the hip. Trochanters are nontender. No sciatic tension signs. Specialty Comments:  No specialty comments available.  Imaging: No results found.   PMFS History: Patient Active Problem List   Diagnosis Date Noted  . Bilateral leg pain 11/22/2016  . Insomnia 10/13/2016  . Cough 07/22/2016  . Wheezing 07/22/2016  . Upper respiratory infection 03/20/2016  . Peripheral edema 02/21/2016  . Leg swelling 02/05/2016  . Low back pain radiating to lower extremity 02/05/2016  . Vertigo   . Hypersomnolence 10/24/2015  . 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 Chest pain 01/18/2013  . Hematochezia 10/12/2012  . Viral illness 05/26/2012  . Palpitations 01/05/2012  . 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 Fatigue 04/09/2011  . Rash 12/22/2010  . Encounter for long-term (current) use of high-risk medication 10/08/2010  . Impaired glucose tolerance 10/04/2010  . Preventative health care 10/04/2010  . Carotid artery stenosis 07/01/2009  . PVD 06/25/2009  . Allergic rhinitis 09/16/2007  . ESOPHAGEAL STRICTURE 09/16/2007  . COLONIC POLYPS, HX OF 09/16/2007  . ANXIETY 03/28/2007  . ERECTILE DYSFUNCTION 03/28/2007  . Hyperlipidemia 03/28/2007  . Essential hypertension 03/28/2007  . LOW BACK PAIN 03/28/2007  . OBESITY 03/24/2007  . HEMORRHOIDS, INTERNAL 03/24/2007  . ESOPHAGITIS 03/24/2007  . GERD 03/24/2007   Past Medical History:  Diagnosis Date  . ALLERGIC RHINITIS 09/16/2007  . ALLERGY 03/24/2007  . ANXIETY  03/28/2007  . CAROTID ARTERY STENOSIS, BILATERAL 07/01/2009  . CEPHALGIA 06/08/2009  . CHEST PAIN 09/16/2007  . COLONIC POLYPS, HX OF 09/16/2007  . DEPRESSION 03/28/2007  . Dizziness and giddiness 06/08/2009  . DYSPNEA/SHORTNESS OF BREATH 09/16/2007  . ERECTILE DYSFUNCTION 03/28/2007  . ESOPHAGEAL STRICTURE 09/16/2007  . ESOPHAGITIS 03/24/2007  . GERD 03/24/2007  . GLUCOSE INTOLERANCE 09/16/2007  . HEMORRHOIDS, INTERNAL 03/24/2007  .  HYPERCHOLESTEROLEMIA 03/24/2007  . HYPERLIPIDEMIA 09/16/2007  . HYPERTENSION 03/28/2007  . 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 Impaired glucose tolerance 10/04/2010  . LOW BACK PAIN 03/28/2007  . MAGNETIC RESONANCE IMAGING, BRAIN, ABNORMAL 06/26/2009  . NECK PAIN, LEFT 06/20/2010  . OBESITY 03/24/2007  . OTITIS MEDIA, LEFT 06/10/2009  . Palpitations 06/20/2010  . PVD 06/25/2009  . RASH-NONVESICULAR 06/17/2009  . URI 08/16/2009    Family History  Problem Relation Age of Onset  . Lymphoma Mother   . Heart disease Father        CHF  . Ovarian cancer Sister   . Lung cancer Sister   . Coronary artery disease Sister        CABG  . Coronary artery disease Brother        stent, and CAD  . Colon cancer Neg Hx   . Esophageal cancer Neg Hx   . Rectal cancer Neg Hx   . Stomach cancer Neg Hx     Past Surgical History:  Procedure Laterality Date  . s/p right knee arthroscopy    . TONSILLECTOMY     Social History   Occupational History  . 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 Smoking status: Former Research scientist (life sciences)  . Smokeless tobacco: Never Used  . Alcohol use 4.2 oz/week    7 Cans of beer per week     Comment: beer most days  . Drug use: No  . Sexual activity: Not on file

## 2016-12-17 ENCOUNTER — Ambulatory Visit (HOSPITAL_COMMUNITY)
Admission: RE | Admit: 2016-12-17 | Discharge: 2016-12-17 | Disposition: A | Discharge status: Home / Self Care | Payer: Medicare Other | Source: Ambulatory Visit | Attending: Cardiology | Admitting: Cardiology

## 2016-12-17 DIAGNOSIS — I6523 Occlusion and stenosis of bilateral carotid arteries: Secondary | ICD-10-CM | POA: Insufficient documentation

## 2016-12-28 ENCOUNTER — Telehealth (INDEPENDENT_AMBULATORY_CARE_PROVIDER_SITE_OTHER): Payer: Self-pay | Admitting: Orthopaedic Surgery

## 2016-12-28 DIAGNOSIS — M5416 Radiculopathy, lumbar region: Secondary | ICD-10-CM

## 2016-12-28 NOTE — Telephone Encounter (Signed)
L spine ESI

## 2016-12-28 NOTE — Telephone Encounter (Signed)
Not sure of this. Did you want an order if so what would you like.

## 2016-12-29 NOTE — Telephone Encounter (Signed)
Not sure what happened but order is in per Dr Erlinda Hong.  Just an FYI, Thank you.

## 2016-12-29 NOTE — Addendum Note (Signed)
Addended by: Precious Bard on: 12/29/2016 11:21 AM   Modules accepted: Orders

## 2017-01-12 ENCOUNTER — Ambulatory Visit (INDEPENDENT_AMBULATORY_CARE_PROVIDER_SITE_OTHER): Payer: Medicare Other

## 2017-01-12 ENCOUNTER — Ambulatory Visit (INDEPENDENT_AMBULATORY_CARE_PROVIDER_SITE_OTHER): Payer: Medicare Other | Admitting: Physical Medicine and Rehabilitation

## 2017-01-12 ENCOUNTER — Encounter (INDEPENDENT_AMBULATORY_CARE_PROVIDER_SITE_OTHER): Payer: Self-pay | Admitting: Physical Medicine and Rehabilitation

## 2017-01-12 VITALS — BP 121/86 | HR 91

## 2017-01-12 DIAGNOSIS — M47816 Spondylosis without myelopathy or radiculopathy, lumbar region: Secondary | ICD-10-CM | POA: Diagnosis not present

## 2017-01-12 DIAGNOSIS — G8929 Other chronic pain: Secondary | ICD-10-CM | POA: Diagnosis not present

## 2017-01-12 DIAGNOSIS — M545 Low back pain, unspecified: Secondary | ICD-10-CM

## 2017-01-12 MED ORDER — LIDOCAINE HCL (PF) 1 % IJ SOLN
2.0000 mL | Freq: Once | INTRAMUSCULAR | Status: AC
  Administered 2017-01-12: 2 mL

## 2017-01-12 MED ORDER — METHYLPREDNISOLONE ACETATE 80 MG/ML IJ SUSP
80.0000 mg | Freq: Once | INTRAMUSCULAR | Status: AC
Start: 2017-01-12 — End: 2017-01-12
  Administered 2017-01-12: 80 mg

## 2017-01-12 NOTE — Patient Instructions (Signed)

## 2017-01-12 NOTE — Progress Notes (Unsigned)
Fluoro Time: 26 sec  MGy: 34.03

## 2017-01-12 NOTE — Progress Notes (Deleted)
Pain across low back for over a year. Gotten worse over time. Right=left. Pain down left leg to foot. Worse with walking and standing. Relief with sitting.

## 2017-01-13 ENCOUNTER — Other Ambulatory Visit: Payer: Self-pay | Admitting: Internal Medicine

## 2017-01-13 NOTE — Procedures (Signed)
[name] is a very pleasant 71 year old gentleman who reports chronic worsening severe axial low back pain across the lower back for over a year without specific injury. He's been followed by Dr. Wyline Copas who requested possible epidural injection. His pain is right and left equally. Pain is worse with standing for any length of time and 5 place to sit. MRI evidence shows mainly facet arthropathy at L4-5 with some lateral recess narrowing but no focal nerve compression or disc herniation or stenosis. I think his pain really is facet joint mediated across the lower back. He is getting some pain in the hip and leg. Depending on how much relief he gets with the facet joint block for his back pain we could look at a one-time epidural injection for his leg if it were an issue. I discussed this at length with the patient he does want to proceed with facet joint block. He would be a good candidate for radiofrequency ablation. Exam shows pain with extension rotation of the lumbar spine and good distal strength without any deficits. He is felt conservative care otherwise. Lumbar Facet Joint Intra-Articular Injection(s) with Fluoroscopic Guidance  Patient: Warren Weeks      Date of Birth: Nov 14, 1945 MRN: [id number] PCP: Biagio Borg, MD      Visit Date: 01/12/2017   Universal Protocol:    Date/Time: 07/25/185:59 AM  Consent Given By: the patient  Position: PRONE   Additional Comments: Vital signs were monitored before and after the procedure. Patient was prepped and draped in the usual sterile fashion. The correct patient, procedure, and site was verified.   Injection Procedure Details:  Procedure Site One Meds Administered:  Meds ordered this encounter  Medications  . lidocaine (PF) (XYLOCAINE) 1 % injection 2 mL  . methylPREDNISolone acetate (DEPO-MEDROL) injection 80 mg     Laterality: Bilateral  Location/Site:  L4-L5  Needle size: 22 guage  Needle type: Spinal  Needle Placement:  Articular  Findings:  -Contrast Used: 1 mL iohexol 180 mg iodine/mL   -Comments: Excellent flow of contrast producing a partial arthrogram.  Procedure Details: The fluoroscope Cromie is vertically oriented in AP, and the inferior recess is visualized beneath the lower pole of the inferior apophyseal process, which represents the target point for needle insertion. When direct visualization is difficult the target point is located at the medial projection of the vertebral pedicle. The region overlying each aforementioned target is locally anesthetized with a 1 to 2 ml. volume of 1% Lidocaine without Epinephrine.   The spinal needle was inserted into each of the above mentioned facet joints using biplanar fluoroscopic guidance. A 0.25 to 0.5 ml. volume of Isovue-250 was injected and a partial facet joint arthrogram was obtained. A single spot film was obtained of the resulting arthrogram.    One to 1.25 ml of the steroid/anesthetic solution was then injected into each of the facet joints noted above.   Additional Comments:  The patient tolerated the procedure well No complications occurred Dressing: Band-Aid    Post-procedure details: Patient was observed during the procedure. Post-procedure instructions were reviewed.  Patient left the clinic in stable condition.

## 2017-02-16 ENCOUNTER — Other Ambulatory Visit: Payer: Self-pay | Admitting: Internal Medicine

## 2017-02-23 ENCOUNTER — Other Ambulatory Visit: Payer: Self-pay | Admitting: Internal Medicine

## 2017-03-16 ENCOUNTER — Ambulatory Visit (INDEPENDENT_AMBULATORY_CARE_PROVIDER_SITE_OTHER)
Admission: RE | Admit: 2017-03-16 | Discharge: 2017-03-16 | Disposition: A | Discharge status: Home / Self Care | Payer: Medicare Other | Source: Ambulatory Visit | Attending: Internal Medicine | Admitting: Internal Medicine

## 2017-03-16 ENCOUNTER — Ambulatory Visit (INDEPENDENT_AMBULATORY_CARE_PROVIDER_SITE_OTHER): Payer: Medicare Other | Admitting: Internal Medicine

## 2017-03-16 ENCOUNTER — Encounter: Payer: Self-pay | Admitting: Internal Medicine

## 2017-03-16 VITALS — BP 128/74 | HR 100 | Temp 98.3°F | Ht 69.0 in | Wt 246.0 lb

## 2017-03-16 DIAGNOSIS — R091 Pleurisy: Secondary | ICD-10-CM

## 2017-03-16 DIAGNOSIS — I10 Essential (primary) hypertension: Secondary | ICD-10-CM

## 2017-03-16 DIAGNOSIS — Z23 Encounter for immunization: Secondary | ICD-10-CM | POA: Diagnosis not present

## 2017-03-16 DIAGNOSIS — L040 Acute lymphadenitis of face, head and neck: Secondary | ICD-10-CM

## 2017-03-16 MED ORDER — AZITHROMYCIN 250 MG PO TABS
ORAL_TABLET | ORAL | 1 refills | Status: DC
Start: 2017-03-16 — End: 2017-05-12

## 2017-03-16 NOTE — Progress Notes (Signed)
Subjective:    Patient ID: Warren Weeks, male    DOB: 22-Jan-1946, 71 y.o.   MRN: [id number]  HPI  Here with right far lateral CP mild intermittent sharp pleuritic without sob, ST, fever, or cough x 2-3 days, without trauma or rash.  Pt denies other chest pain, increased sob or doe, wheezing, orthopnea, PND, increased LE swelling, palpitations, dizziness or syncope.  He is unaware of any ST, or neck pain or swelling.  Denies worsening reflux, abd pain, dysphagia, n/v, bowel change or blood. Past Medical History:  Diagnosis Date  . ALLERGIC RHINITIS 09/16/2007  . ALLERGY 03/24/2007  . ANXIETY 03/28/2007  . CAROTID ARTERY STENOSIS, BILATERAL 07/01/2009  . CEPHALGIA 06/08/2009  . CHEST PAIN 09/16/2007  . COLONIC POLYPS, HX OF 09/16/2007  . DEPRESSION 03/28/2007  . Dizziness and giddiness 06/08/2009  . DYSPNEA/SHORTNESS OF BREATH 09/16/2007  . ERECTILE DYSFUNCTION 03/28/2007  . ESOPHAGEAL STRICTURE 09/16/2007  . ESOPHAGITIS 03/24/2007  . GERD 03/24/2007  . GLUCOSE INTOLERANCE 09/16/2007  . HEMORRHOIDS, INTERNAL 03/24/2007  . HYPERCHOLESTEROLEMIA 03/24/2007  . HYPERLIPIDEMIA 09/16/2007  . HYPERTENSION 03/28/2007  . 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 Impaired glucose tolerance 10/04/2010  . LOW BACK PAIN 03/28/2007  . MAGNETIC RESONANCE IMAGING, BRAIN, ABNORMAL 06/26/2009  . NECK PAIN, LEFT 06/20/2010  . OBESITY 03/24/2007  . OTITIS MEDIA, LEFT 06/10/2009  . Palpitations 06/20/2010  . PVD 06/25/2009  . RASH-NONVESICULAR 06/17/2009  . URI 08/16/2009   Past Surgical History:  Procedure Laterality Date  . s/p right knee arthroscopy    . TONSILLECTOMY      reports that he has quit smoking. He has never used smokeless tobacco. He reports that he drinks about 4.2 oz of alcohol per week . He reports that he does not use drugs. family history includes Coronary artery disease in his brother and sister; Heart disease in his father; Lung cancer in his sister; Lymphoma in his mother; Ovarian cancer in his  sister. Allergies  Allergen Reactions  . Cefuroxime Axetil     Rash    Current Outpatient Prescriptions on File Prior to Visit  Medication Sig Dispense Refill  . aspirin (ECOTRIN LOW STRENGTH) 81 MG EC tablet Take 81 mg by mouth daily.      . cyclobenzaprine (FLEXERIL) 5 MG tablet Take 1 tablet (5 mg total) by mouth 3 (three) times daily as needed for muscle spasms. 60 tablet 1  . diclofenac (VOLTAREN) 75 MG EC tablet TAKE 1 TABLET BY MOUTH ONCE DAILY AS NEEDED 180 tablet 0  . fluticasone (FLONASE) 50 MCG/ACT nasal spray USE TWO SPRAY(S) IN EACH NOSTRIL ONCE DAILY 16 g 5  . hydrochlorothiazide (HYDRODIURIL) 25 MG tablet TAKE ONE TABLET BY MOUTH ONCE DAILY 90 tablet 3  . lisinopril (PRINIVIL,ZESTRIL) 10 MG tablet TAKE 1 TABLET BY MOUTH ONCE DAILY 90 tablet 1  . meclizine (ANTIVERT) 12.5 MG tablet Take 1-2 tablets (12.5-25 mg total) by mouth 3 (three) times daily as needed for dizziness. 60 tablet 0  . Melatonin 10 MG TABS Take by mouth.    . metoprolol tartrate (LOPRESSOR) 50 MG tablet TAKE ONE-HALF TABLET BY MOUTH TWICE DAILY 90 tablet 3  . Omega-3 Fatty Acids (FISH OIL) 1000 MG CAPS Take 1 capsule by mouth daily.    Marland Kitchen omeprazole (PRILOSEC) 20 MG capsule Take 1 capsule by mouth 2 (two) times daily.    . rosuvastatin (CRESTOR) 40 MG tablet TAKE 1 TABLET BY MOUTH ONCE DAILY 90 tablet 0  . sildenafil (VIAGRA) 100 MG tablet  Take 1 tablet (100 mg total) by mouth as needed for erectile dysfunction. 10 tablet 11  . terazosin (HYTRIN) 5 MG capsule Take 1 capsule by mouth daily.    Marland Kitchen testosterone (TESTIM) 50 MG/5GM (1%) GEL Place 5 g onto the skin daily.    Marland Kitchen thiamine 100 MG tablet Take 1 tablet (100 mg total) by mouth daily. 30 tablet 0   No current facility-administered medications on file prior to visit.    Review of Systems  Constitutional: Negative for other unusual diaphoresis or sweats HENT: Negative for ear discharge or swelling Eyes: Negative for other worsening visual  disturbances Respiratory: Negative for stridor or other swelling  Gastrointestinal: Negative for worsening distension or other blood Genitourinary: Negative for retention or other urinary change Musculoskeletal: Negative for other MSK pain or swelling Skin: Negative for color change or other new lesions Neurological: Negative for worsening tremors and other numbness  Psychiatric/Behavioral: Negative for worsening agitation or other fatigue All other system neg per pt    Objective:   Physical Exam BP 128/74   Pulse 100   Temp 98.3 F (36.8 C) (Oral)   Ht 5\' 9"  (1.753 m)   Wt 246 lb (111.6 kg)   SpO2 98%   BMI 36.33 kg/m  VS noted,  Constitutional: Pt appears in NAD HENT: Head: NCAT.  Right Ear: External ear normal.  Left Ear: External ear normal.  Eyes: . Pupils are equal, round, and reactive to light. Conjunctivae and EOM are normal Bilat tm's with mild erythema.  Max sinus areas non tender.  Pharynx with mild erythema, no exudate Nose: without d/c or deformity but with tender bialt submandibular LA with mild swelling Neck: Neck supple. Gross normal ROM Cardiovascular: Normal rate and regular rhythm.   Pulmonary/Chest: Effort normal and breath sounds without rales or wheezing.  Abd:  Soft, NT, ND, + BS Neurological: Pt is alert. At baseline orientation, motor grossly intact Skin: Skin is warm. No rashes, other new lesions, no LE edema Psychiatric: Pt behavior is normal without agitation  No other exam findings     Assessment & Plan:

## 2017-03-16 NOTE — Patient Instructions (Signed)
Please take all new medication as prescribed - the antibiotic  Please continue all other medications as before, and refills have been done if requested.  Please have the pharmacy call with any other refills you may need.  Please continue your efforts at being more active, low cholesterol diet, and weight control.  You are otherwise up to date with prevention measures today.  Please keep your appointments with your specialists as you may have planned  Please go to the XRAY Department in the Basement (go straight as you get off the elevator) for the x-ray testing  You will be contacted by phone if any changes need to be made immediately.  Otherwise, you will receive a letter about your results with an explanation, but please check with MyChart first.  Please remember to sign up for MyChart if you have not done so, as this will be important to you in the future with finding out test results, communicating by private email, and scheduling acute appointments online when needed.

## 2017-03-17 NOTE — Assessment & Plan Note (Signed)
For pain control, cxr,  to f/u any worsening symptoms or concerns

## 2017-03-17 NOTE — Assessment & Plan Note (Signed)
stable overall by history and exam, recent data reviewed with pt, and pt to continue medical treatment as before,  to f/u any worsening symptoms or concerns BP Readings from Last 3 Encounters:  03/16/17 128/74  01/12/17 121/86  11/20/16 126/74

## 2017-03-17 NOTE — Assessment & Plan Note (Signed)
Likely infectious and I suspect related to his pleurisy; Mild to mod, for antibx course,  to f/u any worsening symptoms or concerns

## 2017-04-15 ENCOUNTER — Other Ambulatory Visit: Payer: Self-pay | Admitting: Internal Medicine

## 2017-04-19 ENCOUNTER — Other Ambulatory Visit: Payer: Self-pay | Admitting: Internal Medicine

## 2017-05-12 ENCOUNTER — Encounter: Payer: Self-pay | Admitting: Internal Medicine

## 2017-05-12 ENCOUNTER — Ambulatory Visit: Payer: Medicare Other | Admitting: Internal Medicine

## 2017-05-12 ENCOUNTER — Ambulatory Visit (INDEPENDENT_AMBULATORY_CARE_PROVIDER_SITE_OTHER)
Admission: RE | Admit: 2017-05-12 | Discharge: 2017-05-12 | Disposition: A | Discharge status: Home / Self Care | Payer: Medicare Other | Source: Ambulatory Visit | Attending: Internal Medicine | Admitting: Internal Medicine

## 2017-05-12 VITALS — BP 118/82 | HR 67 | Temp 97.8°F | Ht 69.0 in | Wt 247.0 lb

## 2017-05-12 DIAGNOSIS — R079 Chest pain, unspecified: Secondary | ICD-10-CM

## 2017-05-12 DIAGNOSIS — I10 Essential (primary) hypertension: Secondary | ICD-10-CM | POA: Diagnosis not present

## 2017-05-12 DIAGNOSIS — J449 Chronic obstructive pulmonary disease, unspecified: Secondary | ICD-10-CM | POA: Diagnosis not present

## 2017-05-12 HISTORY — DX: Chronic obstructive pulmonary disease, unspecified: J44.9

## 2017-05-12 MED ORDER — DICLOFENAC SODIUM 75 MG PO TBEC: 75.0000 mg | DELAYED_RELEASE_TABLET | Freq: Two times a day (BID) | ORAL | 1 refills | Status: DC

## 2017-05-12 NOTE — Progress Notes (Signed)
Subjective:    Patient ID: Warren Weeks, male    DOB: 1945-12-13, 71 y.o.   MRN: [id number]  HPI  Here with c/o persistently recurrent right lateral CP as per last visit x 2 mo, just never got better. CXR neg for acute at that time  Sharp, mild to mod recurrent, seems to involve now a line around the chest from right parathoracic to right parasternal, with a paricularly sore area to touch and any movement like turning over in bed and standing up. Ran out of voltaren 75 prn and last rx only was for once daily and did not last well.  Also has had what seems like likely intermittent mild low mid lowest sternal discomfort assoc with belching he thinks may be reflux but the other pain is much more concerning to him.  Has COPD by recent cxr, but quit smoking x 20 yrs, denies ST, fever, or worsening sob or wheezing.  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   Past Medical History:  Diagnosis Date  . ALLERGIC RHINITIS 09/16/2007  . ALLERGY 03/24/2007  . ANXIETY 03/28/2007  . CAROTID ARTERY STENOSIS, BILATERAL 07/01/2009  . CEPHALGIA 06/08/2009  . CHEST PAIN 09/16/2007  . COLONIC POLYPS, HX OF 09/16/2007  . COPD (chronic obstructive pulmonary disease) ([place]) 05/12/2017  . DEPRESSION 03/28/2007  . Dizziness and giddiness 06/08/2009  . DYSPNEA/SHORTNESS OF BREATH 09/16/2007  . ERECTILE DYSFUNCTION 03/28/2007  . ESOPHAGEAL STRICTURE 09/16/2007  . ESOPHAGITIS 03/24/2007  . GERD 03/24/2007  . GLUCOSE INTOLERANCE 09/16/2007  . HEMORRHOIDS, INTERNAL 03/24/2007  . HYPERCHOLESTEROLEMIA 03/24/2007  . HYPERLIPIDEMIA 09/16/2007  . HYPERTENSION 03/28/2007  . 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 Impaired glucose tolerance 10/04/2010  . LOW BACK PAIN 03/28/2007  . MAGNETIC RESONANCE  IMAGING, BRAIN, ABNORMAL 06/26/2009  . NECK PAIN, LEFT 06/20/2010  . OBESITY 03/24/2007  . OTITIS MEDIA, LEFT 06/10/2009  . Palpitations 06/20/2010  . PVD 06/25/2009  . RASH-NONVESICULAR 06/17/2009  . URI 08/16/2009   Past Surgical History:  Procedure Laterality Date  . s/p right knee arthroscopy    . TONSILLECTOMY      reports that he has quit smoking. he has never used smokeless tobacco. He reports that he drinks about 4.2 oz of alcohol per week. He reports that he does not use drugs. family history includes Coronary artery disease in his brother and sister; Heart disease in his father; Lung cancer in his sister; Lymphoma in his mother; Ovarian cancer in his sister. Allergies  Allergen Reactions  . Cefuroxime Axetil     Rash    Current Outpatient Medications on File Prior to Visit  Medication Sig Dispense Refill  . aspirin (ECOTRIN LOW STRENGTH) 81 MG EC tablet Take 81 mg by mouth daily.      . cyclobenzaprine (FLEXERIL) 5 MG tablet Take 1 tablet (5 mg total) by mouth 3 (three) times daily as needed for muscle spasms. 60 tablet 1  . fluticasone (FLONASE) 50 MCG/ACT nasal spray USE TWO SPRAY(S) IN EACH NOSTRIL ONCE DAILY 16 g 5  . hydrochlorothiazide (HYDRODIURIL) 25 MG tablet TAKE ONE TABLET BY MOUTH ONCE DAILY 90 tablet 3  . lisinopril (PRINIVIL,ZESTRIL) 10 MG tablet TAKE 1 TABLET BY MOUTH ONCE DAILY 90 tablet 1  . meclizine (ANTIVERT) 12.5  MG tablet Take 1-2 tablets (12.5-25 mg total) by mouth 3 (three) times daily as needed for dizziness. 60 tablet 0  . Melatonin 10 MG TABS Take by mouth.    . metoprolol tartrate (LOPRESSOR) 50 MG tablet TAKE ONE-HALF TABLET BY MOUTH TWICE DAILY 90 tablet 3  . Omega-3 Fatty Acids (FISH OIL) 1000 MG CAPS Take 1 capsule by mouth daily.    Marland Kitchen omeprazole (PRILOSEC) 20 MG capsule Take 1 capsule by mouth 2 (two) times daily.    Marland Kitchen omeprazole (PRILOSEC) 20 MG capsule TAKE 1 CAPSULE BY MOUTH TWICE DAILY 180 capsule 1  . rosuvastatin (CRESTOR) 40 MG tablet  TAKE 1 TABLET BY MOUTH ONCE DAILY 90 tablet 2  . sildenafil (VIAGRA) 100 MG tablet Take 1 tablet (100 mg total) by mouth as needed for erectile dysfunction. 10 tablet 11  . terazosin (HYTRIN) 5 MG capsule Take 1 capsule by mouth daily.    Marland Kitchen testosterone (TESTIM) 50 MG/5GM (1%) GEL Place 5 g onto the skin daily.    Marland Kitchen thiamine 100 MG tablet Take 1 tablet (100 mg total) by mouth daily. 30 tablet 0   No current facility-administered medications on file prior to visit.    Review of Systems . Constitutional: Negative for other unusual diaphoresis or sweats HENT: Negative for ear discharge or swelling Eyes: Negative for other worsening visual disturbances Respiratory: Negative for stridor or other swelling  Gastrointestinal: Negative for worsening distension or other blood Genitourinary: Negative for retention or other urinary change Musculoskeletal: Negative for other MSK pain or swelling Skin: Negative for color change or other new lesions Neurological: Negative for worsening tremors and other numbness  Psychiatric/Behavioral: Negative for worsening agitation or other fatigue All other system neg per pt    Objective:   Physical Exam BP 118/82   Pulse 67   Temp 97.8 F (36.6 C) (Oral)   Ht 5\' 9"  (1.753 m)   Wt 247 lb (112 kg)   SpO2 99%   BMI 36.48 kg/m  VS noted, not ill appearing Constitutional: Pt appears in NAD HENT: Head: NCAT.  Right Ear: External ear normal.  Left Ear: External ear normal.  Eyes: . Pupils are equal, round, and reactive to light. Conjunctivae and EOM are normal Nose: without d/c or deformity Neck: Neck supple. Gross normal ROM Cardiovascular: Normal rate and regular rhythm.   Pulmonary/Chest: Effort normal and breath sounds without rales or wheezing.  T-spine NT in the midline, has some right parathoracic tender without rash or swelling about t6, also has tender area to palpate about t6 rib at the right anterior axillary line LS Spine: diffuse mid low  lumbar tender without swelling Abd:  Soft, NT, ND, + BS, no organomegaly Neurological: Pt is alert. At baseline orientation, motor grossly intact Skin: Skin is warm. No rashes, other new lesions, no LE edema Psychiatric: Pt behavior is normal without agitation  No other exam findings  ECG today I have personally interpreted SR - no ischemic changes    Assessment & Plan:

## 2017-05-12 NOTE — Patient Instructions (Signed)
Your EKG was OK today  Please continue all other medications as before, including the voltaren  Please have the pharmacy call with any other refills you may need.  Please keep your appointments with your specialists as you may have planned  - Dr Collie Siad for the lower back   You will be contacted regarding the referral for: stress test  Please go to the XRAY Department in the Basement (go straight as you get off the elevator) for the x-ray testing  You will be contacted by phone if any changes need to be made immediately.  Otherwise, you will receive a letter about your results with an explanation, but please check with MyChart first.  Please remember to sign up for MyChart if you have not done so, as this will be important to you in the future with finding out test results, communicating by private email, and scheduling acute appointments online when needed.

## 2017-05-13 NOTE — Assessment & Plan Note (Signed)
Overall stable, d/w pt, does not appear to require tx at this time

## 2017-05-13 NOTE — Assessment & Plan Note (Addendum)
Atypical ,? MSK but cant r/o pleurisy or other pulm issue, reflux occasional, neuritis, or even cardiac; will be for repeat cxr, right rib films and thoracic spine films, for cardiac stress testing (pharmacologic given back pain and unable to ambulate well enough), consider MRI t spine and/or gabapentin trial; to restart volataren bid prn

## 2017-05-13 NOTE — Assessment & Plan Note (Signed)
stable overall by history and exam, recent data reviewed with pt, and pt to continue medical treatment as before,  to f/u any worsening symptoms or concerns \ BP Readings from Last 3 Encounters:  05/12/17 118/82  03/16/17 128/74  01/12/17 121/86

## 2017-05-20 ENCOUNTER — Telehealth (HOSPITAL_COMMUNITY): Payer: Self-pay | Admitting: Internal Medicine

## 2017-05-24 ENCOUNTER — Telehealth (HOSPITAL_COMMUNITY): Payer: Self-pay | Admitting: *Deleted

## 2017-05-24 NOTE — Telephone Encounter (Signed)
Patient called [employer] office for instructions.He stated no one had called him to give instructions. Patient given detailed instructions per Myocardial Perfusion Study Information Sheet for the test on 04/25/17 at 0800. Patient notified to arrive 15 minutes early and that it is imperative to arrive on time for appointment to keep from having the test rescheduled.  If you need to cancel or reschedule your appointment, please call the office within 24 hours of your appointment. . Patient verbalized understanding.Coriana Angello, Ranae Palms

## 2017-05-25 ENCOUNTER — Ambulatory Visit (HOSPITAL_COMMUNITY)
Admission: RE | Admit: 2017-05-25 | Discharge: 2017-05-25 | Disposition: A | Discharge status: Home / Self Care | Payer: Medicare Other | Source: Ambulatory Visit | Attending: Cardiology | Admitting: Cardiology

## 2017-05-25 DIAGNOSIS — R079 Chest pain, unspecified: Secondary | ICD-10-CM | POA: Insufficient documentation

## 2017-05-25 LAB — MYOCARDIAL PERFUSION IMAGING
CHL CUP NUCLEAR SDS: 0
CHL CUP NUCLEAR SRS: 0
CHL CUP RESTING HR STRESS: 65 {beats}/min
CSEPPHR: 99 {beats}/min
LV dias vol: 131 mL (ref 62–150)
LV sys vol: 66 mL
SSS: 0
TID: 1.1

## 2017-05-25 MED ORDER — REGADENOSON 0.4 MG/5ML IV SOLN
0.4000 mg | Freq: Once | INTRAVENOUS | Status: AC
  Administered 2017-05-25: 0.4 mg via INTRAVENOUS

## 2017-05-25 MED ORDER — TECHNETIUM TC 99M TETROFOSMIN IV KIT
10.4000 | PACK | Freq: Once | INTRAVENOUS | Status: AC | PRN
  Administered 2017-05-25: 10.4 via INTRAVENOUS
  Filled 2017-05-25: qty 11

## 2017-05-25 MED ORDER — TECHNETIUM TC 99M TETROFOSMIN IV KIT
32.0000 | PACK | Freq: Once | INTRAVENOUS | Status: AC | PRN
  Administered 2017-05-25: 32 via INTRAVENOUS
  Filled 2017-05-25: qty 32

## 2017-05-27 NOTE — Telephone Encounter (Signed)
User: Cherie Dark A Date/time: 05/20/17 11:46 AM  Comment: Called pt and lmsg for him to CB to get scheduled for an myoview.   Context:  Outcome: Left Message  Phone number: [phone] Phone Type: Home Phone  Comm. type: Telephone Call type: Outgoing  Contact: Suares, Gibson L Relation to patient: Self

## 2017-06-01 ENCOUNTER — Ambulatory Visit: Payer: Medicare Other | Admitting: Internal Medicine

## 2017-06-10 ENCOUNTER — Ambulatory Visit: Payer: Medicare Other | Admitting: Internal Medicine

## 2017-06-10 ENCOUNTER — Encounter: Payer: Self-pay | Admitting: Internal Medicine

## 2017-06-10 VITALS — BP 142/90 | HR 71 | Temp 97.9°F | Ht 69.0 in | Wt 241.0 lb

## 2017-06-10 DIAGNOSIS — E785 Hyperlipidemia, unspecified: Secondary | ICD-10-CM | POA: Diagnosis not present

## 2017-06-10 DIAGNOSIS — I10 Essential (primary) hypertension: Secondary | ICD-10-CM

## 2017-06-10 DIAGNOSIS — R7302 Impaired glucose tolerance (oral): Secondary | ICD-10-CM | POA: Diagnosis not present

## 2017-06-10 DIAGNOSIS — Z0000 Encounter for general adult medical examination without abnormal findings: Secondary | ICD-10-CM

## 2017-06-10 DIAGNOSIS — J309 Allergic rhinitis, unspecified: Secondary | ICD-10-CM

## 2017-06-10 DIAGNOSIS — I6523 Occlusion and stenosis of bilateral carotid arteries: Secondary | ICD-10-CM | POA: Diagnosis not present

## 2017-06-10 DIAGNOSIS — J069 Acute upper respiratory infection, unspecified: Secondary | ICD-10-CM

## 2017-06-10 MED ORDER — EZETIMIBE 10 MG PO TABS: 10.0000 mg | ORAL_TABLET | Freq: Every day | ORAL | 3 refills | Status: DC

## 2017-06-10 MED ORDER — ZOSTER VAC RECOMB ADJUVANTED 50 MCG/0.5ML IM SUSR: 0.5000 mL | Freq: Once | INTRAMUSCULAR | 1 refills | Status: AC

## 2017-06-10 MED ORDER — AZITHROMYCIN 250 MG PO TABS: ORAL_TABLET | ORAL | 1 refills | Status: DC

## 2017-06-10 NOTE — Assessment & Plan Note (Addendum)
For f/u carotid duplex June 2019, to cont modify CRFs, stable

## 2017-06-10 NOTE — Progress Notes (Signed)
Subjective:    Patient ID: Warren Weeks, male    DOB: Apr 25, 1946, 71 y.o.   MRN: [id number]  HPI    Here with 2-3 days acute onset fever, facial pain, pressure, headache, general weakness and malaise, and greenish d/c, with mild ST and cough, but pt denies chest pain, wheezing, increased sob or doe, orthopnea, PND, increased LE swelling, palpitations, dizziness or syncope.  Pt denies new neurological symptoms such as new headache, or facial or extremity weakness or numbness   BP Readings from Last 3 Encounters:  06/10/17 (!) 142/90  05/12/17 118/82  03/16/17 128/74  Also c/o some hoarseness with sinus drainage, No sinusitis symtpoms, Does have several wks ongoing nasal allergy symptoms with clearish congestion, itch and sneezing, without fever, pain, ST, cough, swelling or wheezing.  S/p MRI June 2018 LS spine with Djd and spinal stenosis, saw Dr Erlinda Hong, s/p ESI at 2 locations, but did not seem to help. Pt continues to have recurring LBP without change in severity, bowel or bladder change, fever, wt loss,  worsening LE pain/numbness/weakness, gait change or falls. Can only walk about 100 ft before onset of pain Past Medical History:  Diagnosis Date  . ALLERGIC RHINITIS 09/16/2007  . ALLERGY 03/24/2007  . ANXIETY 03/28/2007  . CAROTID ARTERY STENOSIS, BILATERAL 07/01/2009  . CEPHALGIA 06/08/2009  . CHEST PAIN 09/16/2007  . COLONIC POLYPS, HX OF 09/16/2007  . COPD (chronic obstructive pulmonary disease) ([place]) 05/12/2017  . DEPRESSION 03/28/2007  . Dizziness and giddiness 06/08/2009  . DYSPNEA/SHORTNESS OF BREATH 09/16/2007  . ERECTILE DYSFUNCTION 03/28/2007  . ESOPHAGEAL STRICTURE 09/16/2007  . ESOPHAGITIS 03/24/2007  . GERD 03/24/2007  . GLUCOSE INTOLERANCE 09/16/2007  . HEMORRHOIDS, INTERNAL 03/24/2007  . HYPERCHOLESTEROLEMIA 03/24/2007  . HYPERLIPIDEMIA 09/16/2007  . HYPERTENSION 03/28/2007  . 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 Impaired glucose tolerance 10/04/2010  . LOW BACK PAIN 03/28/2007  . MAGNETIC  RESONANCE IMAGING, BRAIN, ABNORMAL 06/26/2009  . NECK PAIN, LEFT 06/20/2010  . OBESITY 03/24/2007  . OTITIS MEDIA, LEFT 06/10/2009  . Palpitations 06/20/2010  . PVD 06/25/2009  . RASH-NONVESICULAR 06/17/2009  . URI 08/16/2009   Past Surgical History:  Procedure Laterality Date  . s/p right knee arthroscopy    . TONSILLECTOMY      reports that he has quit smoking. he has never used smokeless tobacco. He reports that he drinks about 4.2 oz of alcohol per week. He reports that he does not use drugs. family history includes Coronary artery disease in his brother and sister; Heart disease in his father; Lung cancer in his sister; Lymphoma in his mother; Ovarian cancer in his sister. Allergies  Allergen Reactions  . Cefuroxime Axetil     Rash    Current Outpatient Medications on File Prior to Visit  Medication Sig Dispense Refill  . aspirin (ECOTRIN LOW STRENGTH) 81 MG EC tablet Take 81 mg by mouth daily.      . cyclobenzaprine (FLEXERIL) 5 MG tablet Take 1 tablet (5 mg total) by mouth 3 (three) times daily as needed for muscle spasms. 60 tablet 1  . diclofenac (VOLTAREN) 75 MG EC tablet Take 1 tablet (75 mg total) by mouth 2 (two) times daily. 180 tablet 1  . fluticasone (FLONASE) 50 MCG/ACT nasal spray USE TWO SPRAY(S) IN EACH NOSTRIL ONCE DAILY 16 g 5  . hydrochlorothiazide (HYDRODIURIL) 25 MG tablet TAKE ONE TABLET BY MOUTH ONCE DAILY 90 tablet 3  . lisinopril (PRINIVIL,ZESTRIL) 10 MG tablet TAKE 1 TABLET BY MOUTH ONCE DAILY 90  tablet 1  . meclizine (ANTIVERT) 12.5 MG tablet Take 1-2 tablets (12.5-25 mg total) by mouth 3 (three) times daily as needed for dizziness. 60 tablet 0  . Melatonin 10 MG TABS Take by mouth.    . metoprolol tartrate (LOPRESSOR) 50 MG tablet TAKE ONE-HALF TABLET BY MOUTH TWICE DAILY 90 tablet 3  . Omega-3 Fatty Acids (FISH OIL) 1000 MG CAPS Take 1 capsule by mouth daily.    Marland Kitchen omeprazole (PRILOSEC) 20 MG capsule Take 1 capsule by mouth 2 (two) times daily.    Marland Kitchen  omeprazole (PRILOSEC) 20 MG capsule TAKE 1 CAPSULE BY MOUTH TWICE DAILY 180 capsule 1  . rosuvastatin (CRESTOR) 40 MG tablet TAKE 1 TABLET BY MOUTH ONCE DAILY 90 tablet 2  . sildenafil (VIAGRA) 100 MG tablet Take 1 tablet (100 mg total) by mouth as needed for erectile dysfunction. 10 tablet 11  . terazosin (HYTRIN) 5 MG capsule Take 1 capsule by mouth daily.    Marland Kitchen testosterone (TESTIM) 50 MG/5GM (1%) GEL Place 5 g onto the skin daily.    Marland Kitchen thiamine 100 MG tablet Take 1 tablet (100 mg total) by mouth daily. 30 tablet 0   No current facility-administered medications on file prior to visit.    Review of Systems  Constitutional: Negative for other unusual diaphoresis or sweats HENT: Negative for ear discharge or swelling Eyes: Negative for other worsening visual disturbances Respiratory: Negative for stridor or other swelling  Gastrointestinal: Negative for worsening distension or other blood Genitourinary: Negative for retention or other urinary change Musculoskeletal: Negative for other MSK pain or swelling Skin: Negative for color change or other new lesions Neurological: Negative for worsening tremors and other numbness  Psychiatric/Behavioral: Negative for worsening agitation or other fatigue No other exam findings    Objective:   Physical Exam BP (!) 142/90   Pulse 71   Temp 97.9 F (36.6 C) (Oral)   Ht 5\' 9"  (1.753 m)   Wt 241 lb (109.3 kg)   SpO2 98%   BMI 35.59 kg/m  VS noted, mild ill Constitutional: Pt appears in NAD HENT: Head: NCAT.  Right Ear: External ear normal.  Left Ear: External ear normal.  Eyes: . Pupils are equal, round, and reactive to light. Conjunctivae and EOM are normal Bilat tm's with mild erythema.  Max sinus areas mild tender.  Pharynx with mild erythema, no exudate  Nose: without d/c or deformity Neck: Neck supple. Gross normal ROM Cardiovascular: Normal rate and regular rhythm.   Pulmonary/Chest: Effort normal and breath sounds without rales or  wheezing.  Abd:  Soft, NT, ND, + BS, no organomegaly Neurological: Pt is alert. At baseline orientation, motor grossly intact Skin: Skin is warm. No rashes, other new lesions, no LE edema Psychiatric: Pt behavior is normal without agitation  No other exam findings    Assessment & Plan:

## 2017-06-10 NOTE — Assessment & Plan Note (Signed)
stable overall by history and exam, recent data reviewed with pt, and pt to continue medical treatment as before,  to f/u any worsening symptoms or concerns Lab Results  Component Value Date   HGBA1C 6.4 11/20/2016

## 2017-06-10 NOTE — Assessment & Plan Note (Addendum)
Mild to mod, for antibx course,  to f/u any worsening symptoms or concerns  Note:  Total time for pt hx, exam, review of record with pt in the room, determination of diagnoses and plan for further eval and tx is > 40 min, with over 50% spent in coordination and counseling of patient including the differential dx, tx, further evaluation and other management of URI/sinusitis, HTn, HLD, hyperglycemia, carotid stenosis and allergic rhinitis

## 2017-06-10 NOTE — Assessment & Plan Note (Signed)
Mild elevated likely reactive, o/w stable overall by history and exam, recent data reviewed with pt, and pt to continue medical treatment as before,  to f/u any worsening symptoms or concerns BP Readings from Last 3 Encounters:  06/10/17 (!) 142/90  05/12/17 118/82  03/16/17 128/74

## 2017-06-10 NOTE — Assessment & Plan Note (Addendum)
stable overall by history and exam, recent data reviewed with pt, goal is to be less than 70, to add zetia 10 qd and o/w and continue medical treatment as before,  to f/u any worsening symptoms or concerns Lab Results  Component Value Date   LDLCALC 97 11/20/2016

## 2017-06-10 NOTE — Assessment & Plan Note (Signed)
Pt to restart flonase asd,  to f/u any worsening symptoms or concerns

## 2017-06-10 NOTE — Patient Instructions (Signed)
Please take all new medication as prescribed - the antibiotic and zetia for cholesterol  Please continue all other medications as before, and refills have been done if requested.  Please have the pharmacy call with any other refills you may need.  Please continue your efforts at being more active, low cholesterol diet, and weight control.  You are otherwise up to date with prevention measures today.  Please keep your appointments with your specialists as you may have planned  Please return in 6 months, or sooner if needed, with Lab testing done 3-5 days before

## 2017-07-01 ENCOUNTER — Other Ambulatory Visit: Payer: Self-pay | Admitting: Internal Medicine

## 2017-07-13 ENCOUNTER — Ambulatory Visit: Payer: Medicare Other | Admitting: Internal Medicine

## 2017-08-21 ENCOUNTER — Other Ambulatory Visit: Payer: Self-pay | Admitting: Internal Medicine

## 2017-08-24 ENCOUNTER — Other Ambulatory Visit: Payer: Self-pay

## 2017-08-24 ENCOUNTER — Emergency Department (HOSPITAL_COMMUNITY)
Admission: EM | Admit: 2017-08-24 | Discharge: 2017-08-24 | Disposition: A | Discharge status: Home / Self Care | Payer: Medicare Other | Attending: Emergency Medicine | Admitting: Emergency Medicine

## 2017-08-24 ENCOUNTER — Emergency Department (HOSPITAL_COMMUNITY): Payer: Medicare Other

## 2017-08-24 DIAGNOSIS — J449 Chronic obstructive pulmonary disease, unspecified: Secondary | ICD-10-CM | POA: Diagnosis not present

## 2017-08-24 DIAGNOSIS — Z7982 Long term (current) use of aspirin: Secondary | ICD-10-CM | POA: Insufficient documentation

## 2017-08-24 DIAGNOSIS — R002 Palpitations: Secondary | ICD-10-CM

## 2017-08-24 DIAGNOSIS — I10 Essential (primary) hypertension: Secondary | ICD-10-CM | POA: Insufficient documentation

## 2017-08-24 DIAGNOSIS — Z79899 Other long term (current) drug therapy: Secondary | ICD-10-CM | POA: Diagnosis not present

## 2017-08-24 DIAGNOSIS — E7800 Pure hypercholesterolemia, unspecified: Secondary | ICD-10-CM | POA: Insufficient documentation

## 2017-08-24 DIAGNOSIS — E785 Hyperlipidemia, unspecified: Secondary | ICD-10-CM | POA: Insufficient documentation

## 2017-08-24 DIAGNOSIS — Z87891 Personal history of nicotine dependence: Secondary | ICD-10-CM | POA: Diagnosis not present

## 2017-08-24 LAB — CBC
HCT: 46.6 % (ref 39.0–52.0)
HEMOGLOBIN: 16 g/dL (ref 13.0–17.0)
MCH: 31.6 pg (ref 26.0–34.0)
MCHC: 34.3 g/dL (ref 30.0–36.0)
MCV: 92.1 fL (ref 78.0–100.0)
PLATELETS: 155 10*3/uL (ref 150–400)
RBC: 5.06 MIL/uL (ref 4.22–5.81)
RDW: 14.1 % (ref 11.5–15.5)
WBC: 5.5 10*3/uL (ref 4.0–10.5)

## 2017-08-24 LAB — BASIC METABOLIC PANEL
Anion gap: 13 (ref 5–15)
BUN: 17 mg/dL (ref 6–20)
CALCIUM: 9.5 mg/dL (ref 8.9–10.3)
CO2: 24 mmol/L (ref 22–32)
CREATININE: 1.19 mg/dL (ref 0.61–1.24)
Chloride: 102 mmol/L (ref 101–111)
GFR calc Af Amer: >60 mL/min (ref >60)
GLUCOSE: 106 mg/dL — AB (ref 65–99)
Potassium: 4.3 mmol/L (ref 3.5–5.1)
Sodium: 139 mmol/L (ref 135–145)

## 2017-08-24 LAB — I-STAT TROPONIN, ED: TROPONIN I, POC: 0 ng/mL (ref 0.00–0.08)

## 2017-08-24 LAB — MAGNESIUM: Magnesium: 2.1 mg/dL (ref 1.7–2.4)

## 2017-08-24 NOTE — ED Provider Notes (Signed)
[facility] Provider Note   CSN: [id number] Arrival date & time: 08/24/17  3235     History   Chief Complaint Chief Complaint  Patient presents with  . Irregular Heart Beat    HPI Warren Weeks is a 72 y.o. male.  Patient is a 72 year old male with a history of hypertension, hyperlipidemia, GERD, COPD, atypical chest pain who is presenting today with palpitations.  Patient states symptoms have been present since he woke up this morning at about 10:00.  He intermittently will feel abnormal beats which is spontaneous.  He states he has been burping a lot and he will feel weird when it happens.  He denies any dizziness, lightheadedness or near syncope.  He has no shortness of breath.  He will occasionally get a sharp chest pain that lasts no more than a few minutes in the center of his chest.  It is not associated with exertion, deep breathing or eating.  He denies any abdominal pain, nausea or vomiting.  Patient states this has happened in the past but it has been several years ago.  He did see his regular doctor in January for pleuritic type chest pain.  At that time he had a normal stress test done and had an additional cholesterol medicines started.  He denies any recent URI symptoms in the last week and has not had cough or fever today.  Other than the cholesterol medication change he started no other new medications and everything he is supposed to be taking he is taking as prescribed.  No over-the-counter medications   The history is provided by the patient.    Past Medical History:  Diagnosis Date  . ALLERGIC RHINITIS 09/16/2007  . ALLERGY 03/24/2007  . ANXIETY 03/28/2007  . CAROTID ARTERY STENOSIS, BILATERAL 07/01/2009  . CEPHALGIA 06/08/2009  . CHEST PAIN 09/16/2007  . COLONIC POLYPS, HX OF 09/16/2007  . COPD (chronic obstructive pulmonary disease) ([place]) 05/12/2017  . DEPRESSION 03/28/2007  . Dizziness and giddiness 06/08/2009  .  DYSPNEA/SHORTNESS OF BREATH 09/16/2007  . ERECTILE DYSFUNCTION 03/28/2007  . ESOPHAGEAL STRICTURE 09/16/2007  . ESOPHAGITIS 03/24/2007  . GERD 03/24/2007  . GLUCOSE INTOLERANCE 09/16/2007  . HEMORRHOIDS, INTERNAL 03/24/2007  . HYPERCHOLESTEROLEMIA 03/24/2007  . HYPERLIPIDEMIA 09/16/2007  . HYPERTENSION 03/28/2007  . 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 Impaired glucose tolerance 10/04/2010  . LOW BACK PAIN 03/28/2007  . MAGNETIC RESONANCE IMAGING, BRAIN, ABNORMAL 06/26/2009  . NECK PAIN, LEFT 06/20/2010  . OBESITY 03/24/2007  . OTITIS MEDIA, LEFT 06/10/2009  . Palpitations 06/20/2010  . PVD 06/25/2009  . RASH-NONVESICULAR 06/17/2009  . URI 08/16/2009    Patient Active Problem List   Diagnosis Date Noted  . COPD (chronic obstructive pulmonary disease) ([place]) 05/12/2017  . Pleurisy 03/16/2017  . Bilateral leg pain 11/22/2016  . Insomnia 10/13/2016  . Cough 07/22/2016  . Wheezing 07/22/2016  . Upper respiratory infection 03/20/2016  . Peripheral edema 02/21/2016  . Leg swelling 02/05/2016  . Low back pain radiating to lower extremity 02/05/2016  . Vertigo   . Hypersomnolence 10/24/2015  . 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 Chest pain 01/18/2013  . Hematochezia 10/12/2012  . Viral illness 05/26/2012  . Palpitations 01/05/2012  . 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 Fatigue 04/09/2011  . Rash 12/22/2010  . Encounter for long-term (current) use of high-risk medication 10/08/2010  . Impaired glucose tolerance 10/04/2010  . Preventative health care 10/04/2010  . Carotid artery stenosis 07/01/2009  . PVD 06/25/2009  . Allergic rhinitis  09/16/2007  . ESOPHAGEAL STRICTURE 09/16/2007  . COLONIC POLYPS, HX OF 09/16/2007  . ANXIETY 03/28/2007  . ERECTILE DYSFUNCTION 03/28/2007  . Hyperlipidemia 03/28/2007  . Essential hypertension 03/28/2007  . LOW BACK PAIN 03/28/2007  . OBESITY 03/24/2007  . HEMORRHOIDS, INTERNAL 03/24/2007  . ESOPHAGITIS 03/24/2007  . GERD 03/24/2007    Past Surgical  History:  Procedure Laterality Date  . s/p right knee arthroscopy    . TONSILLECTOMY         Home Medications    Prior to Admission medications   Medication Sig Start Date End Date Taking? Authorizing Provider  aspirin (ECOTRIN LOW STRENGTH) 81 MG EC tablet Take 81 mg by mouth daily.     Yes [provider]  Cyanocobalamin (B-12 PO) Take 1 tablet by mouth daily.   Yes [provider]  cyclobenzaprine (FLEXERIL) 5 MG tablet Take 1 tablet (5 mg total) by mouth 3 (three) times daily as needed for muscle spasms. 11/20/16  Yes Biagio Borg, MD  diclofenac (VOLTAREN) 75 MG EC tablet Take 1 tablet (75 mg total) by mouth 2 (two) times daily. Patient taking differently: Take 75 mg by mouth 2 (two) times daily as needed (for pain).  05/12/17  Yes Biagio Borg, MD  ezetimibe (ZETIA) 10 MG tablet Take 1 tablet (10 mg total) by mouth daily. 06/10/17  Yes Biagio Borg, MD  fluticasone [facility]) 50 MCG/ACT nasal spray USE 2 SPRAY(S) IN EACH NOSTRIL ONCE DAILY Patient taking differently: Instill 2 sprays into each nostril once a day 07/01/17  Yes Biagio Borg, MD  hydrochlorothiazide (HYDRODIURIL) 25 MG tablet TAKE ONE TABLET BY MOUTH ONCE DAILY Patient taking differently: Take 25 mg by mouth once a day 12/10/16  Yes Biagio Borg, MD  lisinopril (PRINIVIL,ZESTRIL) 10 MG tablet TAKE 1 TABLET BY MOUTH ONCE DAILY Patient taking differently: Take 10 mg by mouth once a day 08/23/17  Yes Biagio Borg, MD  Melatonin 10 MG TABS Take 10 mg by mouth at bedtime.    Yes [provider]  metoprolol tartrate (LOPRESSOR) 50 MG tablet TAKE ONE-HALF TABLET BY MOUTH TWICE DAILY Patient taking differently: Take 25 mg by mouth two times a day 02/16/17  Yes Biagio Borg, MD  Multiple Vitamin (ONE-A-DAY 55 PLUS PO) Take 1 tablet by mouth daily.   Yes [provider]  omeprazole (PRILOSEC) 20 MG capsule TAKE 1 CAPSULE BY MOUTH TWICE DAILY Patient taking differently: Take 20 mg by mouth two  times a day 04/20/17  Yes Biagio Borg, MD  rosuvastatin (CRESTOR) 40 MG tablet TAKE 1 TABLET BY MOUTH ONCE DAILY Patient taking differently: Take 40 mg by mouth at bedtime 04/17/17  Yes Biagio Borg, MD  terazosin (HYTRIN) 5 MG capsule Take 5 mg by mouth at bedtime.  03/22/15  Yes [provider]  testosterone (TESTIM) 50 MG/5GM (1%) GEL Place 5 g onto the skin daily.   Yes [provider]  thiamine 250 MG tablet Take 250 mg by mouth daily.   Yes [provider]  VITAMIN E PO Take 1 capsule by mouth daily.   Yes [provider]  azithromycin (ZITHROMAX Z-PAK) 250 MG tablet 2 tab by mouth day 1, then 1 per daa Patient not taking: Reported on 08/24/2017 06/10/17   Biagio Borg, MD  meclizine (ANTIVERT) 12.5 MG tablet Take 1-2 tablets (12.5-25 mg total) by mouth 3 (three) times daily as needed for dizziness. Patient not taking: Reported on 08/24/2017 02/05/16  Biagio Borg, MD  sildenafil (VIAGRA) 100 MG tablet Take 1 tablet (100 mg total) by mouth as needed for erectile dysfunction. Patient not taking: Reported on 08/24/2017 12/20/12   Biagio Borg, MD  thiamine 100 MG tablet Take 1 tablet (100 mg total) by mouth daily. Patient not taking: Reported on 08/24/2017 12/24/15   Reyne Dumas, MD    Family History Family History  Problem Relation Age of Onset  . Lymphoma Mother   . Heart disease Father        CHF  . Ovarian cancer Sister   . Lung cancer Sister   . Coronary artery disease Sister        CABG  . Coronary artery disease Brother        stent, and CAD  . Colon cancer Neg Hx   . Esophageal cancer Neg Hx   . Rectal cancer Neg Hx   . Stomach cancer Neg Hx     Social History Social History   Tobacco Use  . Smoking status: Former Research scientist (life sciences)  . Smokeless tobacco: Never Used  Substance Use Topics  . Alcohol use: Yes    Alcohol/week: 4.2 oz    Types: 7 Cans of beer per week    Comment: beer most days  . Drug use: No     Allergies   Cefuroxime  axetil   Review of Systems Review of Systems  All other systems reviewed and are negative.    Physical Exam Updated Vital Signs BP (!) 183/105   Pulse 100   Temp 98.1 F (36.7 C) (Oral)   Resp (!) 28   Ht 5\' 9"  (1.753 m)   Wt 112 kg (247 lb)   SpO2 99%   BMI 36.48 kg/m   Physical Exam  Constitutional: He is oriented to person, place, and time. He appears well-developed and well-nourished. No distress.  HENT:  Head: Normocephalic and atraumatic.  Mouth/Throat: Oropharynx is clear and moist.  Eyes: Conjunctivae and EOM are normal. Pupils are equal, round, and reactive to light.  Neck: Normal range of motion. Neck supple.  Cardiovascular: Normal rate, regular rhythm and intact distal pulses.  Occasional extrasystoles are present.  Murmur heard. Pulmonary/Chest: Effort normal and breath sounds normal. No respiratory distress. He has no wheezes. He has no rales.  Abdominal: Soft. He exhibits no distension. There is no tenderness. There is no rebound and no guarding.  Musculoskeletal: Normal range of motion. He exhibits no edema or tenderness.  Neurological: He is alert and oriented to person, place, and time.  Skin: Skin is warm and dry. No rash noted. No erythema.  Psychiatric: He has a normal mood and affect. His behavior is normal.  Nursing note and vitals reviewed.    ED Treatments / Results  Labs (all labs ordered are listed, but only abnormal results are displayed) Labs Reviewed  BASIC METABOLIC PANEL - Abnormal; Notable for the following components:      Result Value   Glucose, Bld 106 (*)    All other components within normal limits  CBC  MAGNESIUM  I-STAT TROPONIN, ED    EKG  EKG Interpretation  Date/Time:  Tuesday August 24 2017 19:24:47 EST Ventricular Rate:  86 PR Interval:  122 QRS Duration: 82 QT Interval:  400 QTC Calculation: 478 R Axis:   19 Text Interpretation:  new  Sinus rhythm with Premature atrial complexes with Abberant conduction  Otherwise normal ECG Confirmed by Blanchie Dessert [phone]) on 08/24/2017 8:32:44 PM  Radiology Dg Chest 2 View  Result Date: 08/24/2017 CLINICAL DATA:  Arrhythmia EXAM: CHEST - 2 VIEW COMPARISON:  05/12/2017 chest radiograph. FINDINGS: Stable cardiomediastinal silhouette with normal heart size. No pneumothorax. No pleural effusion. Lungs appear clear, with no acute consolidative airspace disease and no pulmonary edema. IMPRESSION: No active cardiopulmonary disease. Electronically Signed   By: Ilona Sorrel M.D.   On: 08/24/2017 20:48    Procedures Procedures (including critical care time)  Medications Ordered in ED Medications - No data to display   Initial Impression / Assessment and Plan / ED Course  I have reviewed the triage vital signs and the nursing notes.  Pertinent labs & imaging results that were available during my care of the patient were reviewed by me and considered in my medical decision making (see chart for details).    Patient presenting today complaining of palpitations that have been intermittent since 10:00 this morning.  He states he will occasionally get sharp pain in his chest have a strange sensation but denies near syncope or dizziness associated with this.  Patient's EKG here initially on arrival showed a sinus rhythm with some PVCs.  On my exam in the room patient has a sinus rhythm without any evidence of PVCs.  Patient currently states he does not have the sensation.  He has had no recent surgeries, low suspicion that patient symptoms are related to PE or dissection.  He has no evidence of fluid overload.  Patient's EKG is otherwise within normal limits.  trop is within normal limits, CBC within normal limits, BMP wnl and mag wnl.   Patient denies any excessive caffeine or over-the-counter stimulants or herbal medications.  Patient's heart rate is in the 70s and no signs of ectopy for 5 minutes when I am in the room talking with the patient.  Feel that he is  safe for discharge.  He can follow-up with Dr. Lovena Le by phone tomorrow and schedule an appointment. Final Clinical Impressions(s) / ED Diagnoses   Final diagnoses:  Palpitations    ED Discharge Orders    None       Blanchie Dessert, MD 08/24/17 2241

## 2017-08-24 NOTE — ED Provider Notes (Signed)
Patient placed in Quick Look pathway, seen and evaluated   Chief Complaint: chest pain and fluttering  HPI:   PT states he woke up and started feeling chest tightness and fluttering sensation in chest. States fluttering comes and goes, states about every minute or so.  Chest pain has come and gone as well. Denies recent medication change. Denies caffeine. Denies nausea vomiting. Does admit to some belching and burping. Denies personal hx of heart problems. Reports he does have high cholesterol, high blood pressure, and family hx of cardiac problems. Denies smoking or having DM.   ROS: positive for CP, palpitations, belching. Denies SOB, nausea, vomiting abdominal pain.   Physical Exam:   Gen: No distress  Neuro: Awake and Alert  Skin: Warm    Focused Exam: Regular HR and rhythm. Lungs clear to auscultations bilaterally. Trace peripheral edema bilaterally. ECG showing PACs, othewise normal. WIll get labs, CXR, will monitor.   BP slightly elevated, otherwise normal VS.    Initiation of care has begun. The patient has been counseled on the process, plan, and necessity for staying for the completion/evaluation, and the remainder of the medical screening examination    Jeannett Senior, PA-C 08/24/17 1946    Tegeler, Gwenyth Allegra, MD 08/25/17 [phone]

## 2017-08-24 NOTE — ED Notes (Signed)
Spoke with Coralyn Mark in lab about add on magnesium

## 2017-08-24 NOTE — ED Triage Notes (Signed)
Pt to ED with c/o irreg. Heartbeat.  Onset this am.  Pt c/o mid chest pain, describes pain as burning.  Pt denies nausea or diaphoresis.  St's he feels short of breath at times

## 2017-08-24 NOTE — ED Notes (Signed)
Pt taken to xray 

## 2017-12-01 ENCOUNTER — Encounter: Payer: Self-pay | Admitting: Internal Medicine

## 2017-12-01 ENCOUNTER — Other Ambulatory Visit (INDEPENDENT_AMBULATORY_CARE_PROVIDER_SITE_OTHER): Payer: Medicare Other

## 2017-12-01 ENCOUNTER — Ambulatory Visit (INDEPENDENT_AMBULATORY_CARE_PROVIDER_SITE_OTHER): Payer: Medicare Other | Admitting: Internal Medicine

## 2017-12-01 VITALS — BP 144/90 | HR 76 | Temp 97.9°F | Ht 69.0 in | Wt 249.0 lb

## 2017-12-01 DIAGNOSIS — Z0000 Encounter for general adult medical examination without abnormal findings: Secondary | ICD-10-CM

## 2017-12-01 DIAGNOSIS — M545 Low back pain, unspecified: Secondary | ICD-10-CM

## 2017-12-01 DIAGNOSIS — Z0001 Encounter for general adult medical examination with abnormal findings: Secondary | ICD-10-CM | POA: Diagnosis not present

## 2017-12-01 DIAGNOSIS — R079 Chest pain, unspecified: Secondary | ICD-10-CM | POA: Diagnosis not present

## 2017-12-01 DIAGNOSIS — E291 Testicular hypofunction: Secondary | ICD-10-CM

## 2017-12-01 DIAGNOSIS — I872 Venous insufficiency (chronic) (peripheral): Secondary | ICD-10-CM

## 2017-12-01 DIAGNOSIS — R7302 Impaired glucose tolerance (oral): Secondary | ICD-10-CM

## 2017-12-01 DIAGNOSIS — I10 Essential (primary) hypertension: Secondary | ICD-10-CM

## 2017-12-01 DIAGNOSIS — G8929 Other chronic pain: Secondary | ICD-10-CM | POA: Diagnosis not present

## 2017-12-01 LAB — BASIC METABOLIC PANEL
BUN: 17 mg/dL (ref 6–23)
CHLORIDE: 105 meq/L (ref 96–112)
CO2: 30 mEq/L (ref 19–32)
Calcium: 9.4 mg/dL (ref 8.4–10.5)
Creatinine, Ser: 1.11 mg/dL (ref 0.40–1.50)
GFR: 84.35 mL/min (ref >60.00)
Glucose, Bld: 117 mg/dL — ABNORMAL HIGH (ref 70–99)
POTASSIUM: 4.2 meq/L (ref 3.5–5.1)
SODIUM: 142 meq/L (ref 135–145)

## 2017-12-01 LAB — URINALYSIS, ROUTINE W REFLEX MICROSCOPIC
Bilirubin Urine: NEGATIVE
Hgb urine dipstick: NEGATIVE
Ketones, ur: NEGATIVE
Leukocytes, UA: NEGATIVE
Nitrite: NEGATIVE
PH: 6 (ref 5.0–8.0)
RBC / HPF: NONE SEEN (ref 0–?)
SPECIFIC GRAVITY, URINE: 1.02 (ref 1.000–1.030)
Total Protein, Urine: NEGATIVE
URINE GLUCOSE: NEGATIVE
UROBILINOGEN UA: 0.2 (ref 0.0–1.0)
WBC UA: NONE SEEN (ref 0–?)

## 2017-12-01 LAB — CBC WITH DIFFERENTIAL/PLATELET
BASOS PCT: 0.9 % (ref 0.0–3.0)
Basophils Absolute: 0 10*3/uL (ref 0.0–0.1)
EOS ABS: 0.1 10*3/uL (ref 0.0–0.7)
Eosinophils Relative: 1.1 % (ref 0.0–5.0)
HCT: 40.7 % (ref 39.0–52.0)
HEMOGLOBIN: 13.7 g/dL (ref 13.0–17.0)
LYMPHS ABS: 1.5 10*3/uL (ref 0.7–4.0)
Lymphocytes Relative: 28 % (ref 12.0–46.0)
MCHC: 33.8 g/dL (ref 30.0–36.0)
MCV: 93.4 fl (ref 78.0–100.0)
MONO ABS: 0.5 10*3/uL (ref 0.1–1.0)
Monocytes Relative: 9.9 % (ref 3.0–12.0)
NEUTROS ABS: 3.2 10*3/uL (ref 1.4–7.7)
NEUTROS PCT: 60.1 % (ref 43.0–77.0)
PLATELETS: 155 10*3/uL (ref 150.0–400.0)
RBC: 4.35 Mil/uL (ref 4.22–5.81)
RDW: 14.8 % (ref 11.5–15.5)
WBC: 5.3 10*3/uL (ref 4.0–10.5)

## 2017-12-01 LAB — HEPATIC FUNCTION PANEL
ALT: 46 U/L (ref 0–53)
AST: 29 U/L (ref 0–37)
Albumin: 4.1 g/dL (ref 3.5–5.2)
Alkaline Phosphatase: 69 U/L (ref 39–117)
BILIRUBIN TOTAL: 0.5 mg/dL (ref 0.2–1.2)
Bilirubin, Direct: 0.1 mg/dL (ref 0.0–0.3)
Total Protein: 6.5 g/dL (ref 6.0–8.3)

## 2017-12-01 LAB — HEMOGLOBIN A1C: Hgb A1c MFr Bld: 6.4 % (ref 4.6–6.5)

## 2017-12-01 LAB — LIPID PANEL
CHOLESTEROL: 160 mg/dL (ref 0–200)
HDL: 50.3 mg/dL (ref >39.00)
LDL CALC: 81 mg/dL (ref 0–99)
NonHDL: 109.99
Total CHOL/HDL Ratio: 3
Triglycerides: 144 mg/dL (ref 0.0–149.0)
VLDL: 28.8 mg/dL (ref 0.0–40.0)

## 2017-12-01 LAB — PSA: PSA: 0.13 ng/mL (ref 0.10–4.00)

## 2017-12-01 LAB — TSH: TSH: 2.29 u[IU]/mL (ref 0.35–4.50)

## 2017-12-01 MED ORDER — LISINOPRIL 20 MG PO TABS: 20.0000 mg | ORAL_TABLET | Freq: Every day | ORAL | 3 refills | Status: DC

## 2017-12-01 NOTE — Assessment & Plan Note (Signed)

## 2017-12-01 NOTE — Assessment & Plan Note (Signed)
stable overall by history and exam, and pt to continue medical treatment as before,  to f/u any worsening symptoms or concerns 

## 2017-12-01 NOTE — Assessment & Plan Note (Addendum)
Mild uncontrolled, for increased lisinopril 20 qd  In addition to the time spent performing CPE, I spent an additional 25 minutes face to face,in which greater than 50% of this time was spent in counseling and coordination of care for patient's acute illness as documented, including the differential dx, treatment, further evaluation and other management of uncontrolled HTN, hyerglycemia, hypogonadism, LBP, chest pain, venous insufficiency

## 2017-12-01 NOTE — Assessment & Plan Note (Addendum)
Has f/u urology soon, cont same tx, plans for lab with urology

## 2017-12-01 NOTE — Assessment & Plan Note (Signed)
midl to mod, for compression stockings

## 2017-12-01 NOTE — Patient Instructions (Addendum)
You will be contacted regarding the referral for: colonoscopy for about oct 2019  Please remember to call Dr Gershon Crane for the yearly eye appt, as well as orthopedic for the neck, shoulder, back and leg pain, and even ENT for the deviated nasal septum  Ok to increase the lisinopril to 20 mg per day  Please continue all other medications as before, and refills have been done if requested.  Please have the pharmacy call with any other refills you may need.  Please continue your efforts at being more active, low cholesterol diet, and weight control.  You are otherwise up to date with prevention measures today.  Please keep your appointments with your specialists as you may have planned - urology later this month  Please consider wearing compression stockings during the day for the leg swelling  Please go to the LAB in the Basement (turn left off the elevator) for the tests to be done today  You will be contacted by phone if any changes need to be made immediately.  Otherwise, you will receive a letter about your results with an explanation, but please check with MyChart first.  Please remember to sign up for MyChart if you have not done so, as this will be important to you in the future with finding out test results, communicating by private email, and scheduling acute appointments online when needed.  Please return in 6 months, or sooner if needed, with Lab testing done 3-5 days before

## 2017-12-01 NOTE — Assessment & Plan Note (Signed)
Chronic right lateral c/w msk, stable,  to f/u any worsening symptoms or concerns

## 2017-12-01 NOTE — Assessment & Plan Note (Signed)
Lab Results  Component Value Date   HGBA1C 6.4 12/01/2017  stable overall by history and exam, recent data reviewed with pt, and pt to continue medical treatment as before,  to f/u any worsening symptoms or concerns

## 2017-12-01 NOTE — Progress Notes (Signed)
Subjective:    Patient ID: Warren Weeks, male    DOB: [birth year], 72 y.o.   MRN: [id number]  HPI  Here for wellness and f/u;  Overall doing ok;  Pt denies worsening SOB, DOE, wheezing, orthopnea, PND, palpitations, dizziness or syncope, but has slightly worsening left > right edema better in the PM, worse in the PM and normal BNP last visit.   Pt denies neurological change such as new headache, facial or extremity weakness.  Pt denies polydipsia, polyuria, or low sugar symptoms. Pt states overall good compliance with treatment and medications, good tolerability, and has been trying to follow appropriate diet.  Pt denies worsening depressive symptoms, suicidal ideation or panic. No fever, night sweats, wt loss, loss of appetite, or other constitutional symptoms.  Pt states good ability with ADL's, has low fall risk, home safety reviewed and adequate, no other significant changes in hearing or vision, and only occasionally active with exercise. Also, BP has been mild persistently elevated, as well as at home.   BP Readings from Last 3 Encounters:  12/01/17 (!) 144/90  08/24/17 (!) 140/100  06/10/17 (!) 142/90  Has severe MSK complaints including chronic x 9 mo right lateral cp pleuritic and occasionally sore to touch, neg cxr mar 2019, without sob, fever, cough. Also worsening right neck pain for several months and right shoulder pain x 3 wks; has slightly worsening left shoulder, worse to abduct the arm.  Pt continues to have recurring LBP without change in severity, bowel or bladder change, fever, wt loss,  worsening LE pain/numbness/weakness, gait change or falls. Also due for carotid doppler f/u , pt plans to call. Also mentions chronic nocturia and urinary freq, has urology f/u June 20, also sees for hypogonadism.  Also has chronic nasal septal deviation, has been putting off surgury per ENT.  No other new complaints or interval hx Past Medical History:  Diagnosis Date  . ALLERGIC RHINITIS  09/16/2007  . ALLERGY 03/24/2007  . ANXIETY 03/28/2007  . CAROTID ARTERY STENOSIS, BILATERAL 07/01/2009  . CEPHALGIA 06/08/2009  . CHEST PAIN 09/16/2007  . COLONIC POLYPS, HX OF 09/16/2007  . COPD (chronic obstructive pulmonary disease) ([place]) 05/12/2017  . DEPRESSION 03/28/2007  . Dizziness and giddiness 06/08/2009  . DYSPNEA/SHORTNESS OF BREATH 09/16/2007  . ERECTILE DYSFUNCTION 03/28/2007  . ESOPHAGEAL STRICTURE 09/16/2007  . ESOPHAGITIS 03/24/2007  . GERD 03/24/2007  . GLUCOSE INTOLERANCE 09/16/2007  . HEMORRHOIDS, INTERNAL 03/24/2007  . HYPERCHOLESTEROLEMIA 03/24/2007  . HYPERLIPIDEMIA 09/16/2007  . HYPERTENSION 03/28/2007  . 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 Impaired glucose tolerance 10/04/2010  . LOW BACK PAIN 03/28/2007  . MAGNETIC RESONANCE IMAGING, BRAIN, ABNORMAL 06/26/2009  . NECK PAIN, LEFT 06/20/2010  . OBESITY 03/24/2007  . OTITIS MEDIA, LEFT 06/10/2009  . Palpitations 06/20/2010  . PVD 06/25/2009  . RASH-NONVESICULAR 06/17/2009  . URI 08/16/2009   Past Surgical History:  Procedure Laterality Date  . s/p right knee arthroscopy    . TONSILLECTOMY      reports that he has quit smoking. He has never used smokeless tobacco. He reports that he drinks about 4.2 oz of alcohol per week. He reports that he does not use drugs. family history includes Coronary artery disease in his brother and sister; Heart disease in his father; Lung cancer in his sister; Lymphoma in his mother; Ovarian cancer in his sister. Allergies  Allergen Reactions  . Cefuroxime Axetil Rash   Current Outpatient Medications on File Prior to Visit  Medication Sig Dispense Refill  .  aspirin (ECOTRIN LOW STRENGTH) 81 MG EC tablet Take 81 mg by mouth daily.      . Cyanocobalamin (B-12 PO) Take 1 tablet by mouth daily.    . cyclobenzaprine (FLEXERIL) 5 MG tablet Take 1 tablet (5 mg total) by mouth 3 (three) times daily as needed for muscle spasms. 60 tablet 1  . diclofenac (VOLTAREN) 75 MG EC tablet Take 1 tablet (75 mg  total) by mouth 2 (two) times daily. (Patient taking differently: Take 75 mg by mouth 2 (two) times daily as needed (for pain). ) 180 tablet 1  . ezetimibe (ZETIA) 10 MG tablet Take 1 tablet (10 mg total) by mouth daily. 90 tablet 3  . fluticasone (FLONASE) 50 MCG/ACT nasal spray USE 2 SPRAY(S) IN EACH NOSTRIL ONCE DAILY (Patient taking differently: Instill 2 sprays into each nostril once a day) 16 g 5  . hydrochlorothiazide (HYDRODIURIL) 25 MG tablet TAKE ONE TABLET BY MOUTH ONCE DAILY (Patient taking differently: Take 25 mg by mouth once a day) 90 tablet 3  . meclizine (ANTIVERT) 12.5 MG tablet Take 1-2 tablets (12.5-25 mg total) by mouth 3 (three) times daily as needed for dizziness. 60 tablet 0  . Melatonin 10 MG TABS Take 10 mg by mouth at bedtime.     . metoprolol tartrate (LOPRESSOR) 50 MG tablet TAKE ONE-HALF TABLET BY MOUTH TWICE DAILY (Patient taking differently: Take 25 mg by mouth two times a day) 90 tablet 3  . Multiple Vitamin (ONE-A-DAY 55 PLUS PO) Take 1 tablet by mouth daily.    Marland Kitchen omeprazole (PRILOSEC) 20 MG capsule TAKE 1 CAPSULE BY MOUTH TWICE DAILY (Patient taking differently: Take 20 mg by mouth two times a day) 180 capsule 1  . rosuvastatin (CRESTOR) 40 MG tablet TAKE 1 TABLET BY MOUTH ONCE DAILY (Patient taking differently: Take 40 mg by mouth at bedtime) 90 tablet 2  . sildenafil (VIAGRA) 100 MG tablet Take 1 tablet (100 mg total) by mouth as needed for erectile dysfunction. 10 tablet 11  . terazosin (HYTRIN) 5 MG capsule Take 5 mg by mouth at bedtime.     Marland Kitchen testosterone (TESTIM) 50 MG/5GM (1%) GEL Place 5 g onto the skin daily.    Marland Kitchen thiamine 250 MG tablet Take 250 mg by mouth daily.    Marland Kitchen VITAMIN E PO Take 1 capsule by mouth daily.     No current facility-administered medications on file prior to visit.    Review of Systems Constitutional: Negative for other unusual diaphoresis, sweats, appetite or weight changes HENT: Negative for other worsening hearing loss, ear pain,  facial swelling, mouth sores or neck stiffness.   Eyes: Negative for other worsening pain, redness or other visual disturbance.  Respiratory: Negative for other stridor or swelling Cardiovascular: Negative for other palpitations or other chest pain  Gastrointestinal: Negative for worsening diarrhea or loose stools, blood in stool, distention or other pain Genitourinary: Negative for hematuria, flank pain or other change in urine volume.  Musculoskeletal: Negative for myalgias or other joint swelling.  Skin: Negative for other color change, or other wound or worsening drainage.  Neurological: Negative for other syncope or numbness. Hematological: Negative for other adenopathy or swelling Psychiatric/Behavioral: Negative for hallucinations, other worsening agitation, SI, self-injury, or new decreased concentration All other system neg per pt    Objective:   Physical Exam BP (!) 144/90   Pulse 76   Temp 97.9 F (36.6 C) (Oral)   Ht 5\' 9"  (1.753 m)   Wt 249 lb (112.9  kg)   SpO2 96%   BMI 36.77 kg/m  VS noted,  Constitutional: Pt is oriented to person, place, and time. Appears well-developed and well-nourished, in no significant distress and comfortable Head: Normocephalic and atraumatic  Eyes: Conjunctivae and EOM are normal. Pupils are equal, round, and reactive to light Right Ear: External ear normal without discharge Left Ear: External ear normal without discharge Nose: Nose without discharge or deformity Mouth/Throat: Oropharynx is without other ulcerations and moist  Neck: Normal range of motion. Neck supple. No JVD present. No tracheal deviation present or significant neck LA or mass Cardiovascular: Normal rate, regular rhythm, normal heart sounds and intact distal pulses.   Pulmonary/Chest: WOB normal and breath sounds without rales or wheezing  Abdominal: Soft. Bowel sounds are normal. NT. No HSM  Musculoskeletal: Normal range of motion. Exhibits chronic 1-2+ bilat Left >  right LE edema below the knees Lymphadenopathy: Has no other cervical adenopathy.  Neurological: Pt is alert and oriented to person, place, and time. Pt has normal reflexes. No cranial nerve deficit. Motor grossly intact, Gait intact Skin: Skin is warm and dry. No rash noted or new ulcerations Psychiatric:  Has normal mood and affect. Behavior is normal without agitation No other exam findings Lab Results  Component Value Date   WBC 5.3 12/01/2017   HGB 13.7 12/01/2017   HCT 40.7 12/01/2017   PLT 155.0 12/01/2017   GLUCOSE 117 (H) 12/01/2017   CHOL 160 12/01/2017   TRIG 144.0 12/01/2017   HDL 50.30 12/01/2017   LDLDIRECT 176.0 04/03/2011   LDLCALC 81 12/01/2017   ALT 46 12/01/2017   AST 29 12/01/2017   NA 142 12/01/2017   K 4.2 12/01/2017   CL 105 12/01/2017   CREATININE 1.11 12/01/2017   BUN 17 12/01/2017   CO2 30 12/01/2017   TSH 2.29 12/01/2017   PSA 0.13 12/01/2017   HGBA1C 6.4 12/01/2017       Assessment & Plan:

## 2017-12-17 ENCOUNTER — Ambulatory Visit (HOSPITAL_COMMUNITY)
Admission: RE | Admit: 2017-12-17 | Discharge: 2017-12-17 | Disposition: A | Discharge status: Home / Self Care | Payer: Medicare Other | Source: Ambulatory Visit | Attending: Cardiovascular Disease | Admitting: Cardiovascular Disease

## 2017-12-17 ENCOUNTER — Other Ambulatory Visit: Payer: Self-pay | Admitting: Internal Medicine

## 2017-12-17 DIAGNOSIS — I6523 Occlusion and stenosis of bilateral carotid arteries: Secondary | ICD-10-CM

## 2018-01-18 ENCOUNTER — Other Ambulatory Visit: Payer: Self-pay | Admitting: Internal Medicine

## 2018-01-25 ENCOUNTER — Encounter: Payer: Self-pay | Admitting: Internal Medicine

## 2018-02-17 ENCOUNTER — Ambulatory Visit (AMBULATORY_SURGERY_CENTER): Payer: Self-pay | Admitting: *Deleted

## 2018-02-17 ENCOUNTER — Other Ambulatory Visit: Payer: Self-pay

## 2018-02-17 VITALS — Ht 68.5 in | Wt 249.6 lb

## 2018-02-17 DIAGNOSIS — Z86010 Personal history of colonic polyps: Secondary | ICD-10-CM

## 2018-02-17 MED ORDER — SUPREP BOWEL PREP KIT 17.5-3.13-1.6 GM/177ML PO SOLN: 1.0000 | Freq: Once | ORAL | 0 refills | Status: AC

## 2018-02-17 NOTE — Progress Notes (Signed)
Patient denies any allergies to egg or soy products. Patient denies complications with anesthesia/sedation.  Patient denies oxygen use at home and denies diet medications. Patient denied information on colonoscopy.

## 2018-02-18 ENCOUNTER — Encounter: Payer: Self-pay | Admitting: Internal Medicine

## 2018-02-18 ENCOUNTER — Telehealth: Payer: Self-pay | Admitting: Internal Medicine

## 2018-02-18 NOTE — Telephone Encounter (Signed)
Pt informed that $40 id a great price as this prep can be upwards to $200- pt states he didn't know if a coupon would work and explained the 2 suprep coupons will not work with [employer]- pt states he will go ahead and pay the $40

## 2018-02-20 ENCOUNTER — Other Ambulatory Visit: Payer: Self-pay | Admitting: Internal Medicine

## 2018-03-04 ENCOUNTER — Encounter: Payer: Medicare Other | Admitting: Internal Medicine

## 2018-03-04 ENCOUNTER — Encounter: Payer: Self-pay | Admitting: Internal Medicine

## 2018-03-04 ENCOUNTER — Ambulatory Visit (AMBULATORY_SURGERY_CENTER): Payer: Medicare Other | Admitting: Internal Medicine

## 2018-03-04 ENCOUNTER — Telehealth: Payer: Self-pay | Admitting: Internal Medicine

## 2018-03-04 VITALS — BP 147/86 | HR 66 | Temp 97.8°F | Resp 20 | Ht 68.5 in | Wt 249.0 lb

## 2018-03-04 DIAGNOSIS — K635 Polyp of colon: Secondary | ICD-10-CM

## 2018-03-04 DIAGNOSIS — Z86010 Personal history of colonic polyps: Secondary | ICD-10-CM | POA: Diagnosis present

## 2018-03-04 DIAGNOSIS — D123 Benign neoplasm of transverse colon: Secondary | ICD-10-CM

## 2018-03-04 DIAGNOSIS — D122 Benign neoplasm of ascending colon: Secondary | ICD-10-CM

## 2018-03-04 MED ORDER — SODIUM CHLORIDE 0.9 % IV SOLN: 500.0000 mL | Freq: Once | INTRAVENOUS | Status: DC

## 2018-03-04 NOTE — Patient Instructions (Signed)
Impression/Recommendations:  Polyp handout given to patient. Diverticulosis handout given to patient. Hemorrhoid handout given to patient.  Repeat colonoscopy in 5 years for surveillance.  Resume previous diet. Continue present medications.  YOU HAD AN ENDOSCOPIC PROCEDURE TODAY AT THE [facility]:   Refer to the procedure report that was given to you for any specific questions about what was found during the examination.  If the procedure report does not answer your questions, please call your gastroenterologist to clarify.  If you requested that your care partner not be given the details of your procedure findings, then the procedure report has been included in a sealed envelope for you to review at your convenience later.  YOU SHOULD EXPECT: Some feelings of bloating in the abdomen. Passage of more gas than usual.  Walking can help get rid of the air that was put into your GI tract during the procedure and reduce the bloating. If you had a lower endoscopy (such as a colonoscopy or flexible sigmoidoscopy) you may notice spotting of blood in your stool or on the toilet paper. If you underwent a bowel prep for your procedure, you may not have a normal bowel movement for a few days.  Please Note:  You might notice some irritation and congestion in your nose or some drainage.  This is from the oxygen used during your procedure.  There is no need for concern and it should clear up in a day or so.  SYMPTOMS TO REPORT IMMEDIATELY:   Following lower endoscopy (colonoscopy or flexible sigmoidoscopy):  Excessive amounts of blood in the stool  Significant tenderness or worsening of abdominal pains  Swelling of the abdomen that is new, acute  Fever of 100F or higher  For urgent or emergent issues, a gastroenterologist can be reached at any hour by calling [phone].   DIET:  We do recommend a small meal at first, but then you may proceed to your regular diet.  Drink plenty of  fluids but you should avoid alcoholic beverages for 24 hours.  ACTIVITY:  You should plan to take it easy for the rest of today and you should NOT DRIVE or use heavy machinery until tomorrow (because of the sedation medicines used during the test).    FOLLOW UP: Our staff will call the number listed on your records the next business day following your procedure to check on you and address any questions or concerns that you may have regarding the information given to you following your procedure. If we do not reach you, we will leave a message.  However, if you are feeling well and you are not experiencing any problems, there is no need to return our call.  We will assume that you have returned to your regular daily activities without incident.  If any biopsies were taken you will be contacted by phone or by letter within the next 1-3 weeks.  Please call us at [phone] if you have not heard about the biopsies in 3 weeks.    SIGNATURES/CONFIDENTIALITY: You and/or your care partner have signed paperwork which will be entered into your electronic medical record.  These signatures attest to the fact that that the information above on your After Visit Summary has been reviewed and is understood.  Full responsibility of the confidentiality of this discharge information lies with you and/or your care-partner. 

## 2018-03-04 NOTE — Progress Notes (Signed)
To PACU, VSS. Report to [email or web address] 

## 2018-03-04 NOTE — Progress Notes (Signed)
Called to room to assist during endoscopic procedure.  Patient ID and intended procedure confirmed with present staff. Received instructions for my participation in the procedure from the performing physician.  

## 2018-03-04 NOTE — Progress Notes (Signed)
Pt's states no medical or surgical changes since previsit or office visit. 

## 2018-03-04 NOTE — Op Note (Signed)
[name] Procedure Date: 03/04/2018 8:09 AM MRN: [id number] Endoscopist: Warren Weeks , MD Age: 72 Referring MD:  Date of Birth: 1945/08/08 Gender: Male Account #: [id number] Procedure:                Colonoscopy, With cold snare polypectomy x 2 Indications:              High risk colon cancer surveillance: Personal                            history of non-advanced adenoma, High risk colon                            cancer surveillance: Personal history of sessile                            serrated colon polyp (less than 10 mm in size) with                            no dysplasia. Previous examinations 2006, 2009, 2014 Medicines:                Monitored Anesthesia Care Procedure:                Pre-Anesthesia Assessment:                           - Prior to the procedure, a History and Physical                            was performed, and patient medications and                            allergies were reviewed. The patient's tolerance of                            previous anesthesia was also reviewed. The risks                            and benefits of the procedure and the sedation                            options and risks were discussed with the patient.                            All questions were answered, and informed consent                            was obtained. Prior Anticoagulants: The patient has                            taken no previous anticoagulant or antiplatelet                            agents. ASA Grade Assessment: II - A patient with  mild systemic disease. After reviewing the risks                            and benefits, the patient was deemed in                            satisfactory condition to undergo the procedure.                           After obtaining informed consent, the colonoscope                            was passed under direct vision. Throughout the               procedure, the patient's blood pressure, pulse, and                            oxygen saturations were monitored continuously. The                            Colonoscope was introduced through the anus and                            advanced to the the cecum, identified by                            appendiceal orifice and ileocecal valve. The                            ileocecal valve, appendiceal orifice, and rectum                            were photographed. The quality of the bowel                            preparation was excellent. The colonoscopy was                            performed without difficulty. The patient tolerated                            the procedure well. The bowel preparation used was                            SUPREP. Scope In: 8:23:19 AM Scope Out: 8:37:00 AM Scope Withdrawal Time: 0 hours 12 minutes 11 seconds  Total Procedure Duration: 0 hours 13 minutes 41 seconds  Findings:                 Two polyps were found in the transverse colon and                            ascending colon. The polyps were 1 to 2 mm in size.  These polyps were removed with a cold snare.                            Resection and retrieval were complete.                           Multiple diverticula were found in the left colon.                           Internal hemorrhoids were found during retroflexion.                           The exam was otherwise without abnormality on                            direct and retroflexion views. Complications:            No immediate complications. Estimated blood loss:                            None. Estimated Blood Loss:     Estimated blood loss: none. Impression:               - Two 1 to 2 mm polyps in the transverse colon and                            in the ascending colon, removed with a cold snare.                            Resected and retrieved.                           - Diverticulosis in  the left colon.                           - Internal hemorrhoids. posterior anal fissure.                           - The examination was otherwise normal on direct                            and retroflexion views. Recommendation:           - Repeat colonoscopy in 5 years for surveillance.                           - Patient has a contact number available for                            emergencies. The signs and symptoms of potential                            delayed complications were discussed with the                            patient. Return to normal  activities tomorrow.                            Written discharge instructions were provided to the                            patient.                           - Resume previous diet.                           - Continue present medications.                           - Await pathology results. Warren Chuck. Henrene Pastor, MD 03/04/2018 8:44:12 AM This report has been signed electronically.

## 2018-03-07 ENCOUNTER — Telehealth: Payer: Self-pay

## 2018-03-07 NOTE — Telephone Encounter (Signed)
Unrelated to procedure. If it persists or worsens, he should see his PCP. Thanks

## 2018-03-07 NOTE — Telephone Encounter (Signed)
Attempted to reach patient to f/u on conversation this morning regarding hoarseness in his voice. No answer. Left message that I spoke with Dr. Henrene Pastor and Dr. Henrene Pastor did not feel that this is due to the procedure and to please follow up with his primary physician if it continues to bother him.

## 2018-03-07 NOTE — Telephone Encounter (Signed)
  Follow up Call-  Call back number 03/04/2018  Post procedure Call Back phone  # [phone]  Permission to leave phone message Yes  Some recent data might be hidden     Patient questions:  Do you have a fever, pain , or abdominal swelling? No. Pain Score  0 *  Have you tolerated food without any problems? Yes.    Have you been able to return to your normal activities? Yes.    Do you have any questions about your discharge instructions: Diet   No. Medications  No. Follow up visit  No.  Do you have questions or concerns about your Care? Yes.  Patient states his voice was hoarse upon awakening from procedure on Friday. He states he called back to the GI office later that day to ask if this was normal, but his call was not returned. When questioned, he states his throat is not sore and he doesn't have any difficulty eating his regular diet, He has no other complaints. Patient had colonoscopy only (not upper endoscopy). I informed patient that I suspected his hoarseness could possible be do to the oxygen used during the procedure as it is very drying, but I will consult with Dr. Henrene Pastor and return his call personally today. Pt. verbalizes understanding.  Actions: * If pain score is 4 or above: Physician/ provider Notified : Scarlette Shorts, MD.

## 2018-03-08 ENCOUNTER — Encounter: Payer: Self-pay | Admitting: Internal Medicine

## 2018-03-15 NOTE — Progress Notes (Addendum)
Subjective:   Warren Weeks is a 72 y.o. male who presents for Medicare Annual/Subsequent preventive examination.  Review of Systems:  No ROS.  Medicare Wellness Visit. Additional risk factors are reflected in the social history.  Cardiac Risk Factors include: advanced age (>75men, >51 women);dyslipidemia;male gender;obesity (BMI >30kg/m2);sedentary lifestyle Sleep patterns: has frequent nighttime awakenings, gets up 2-3 times nightly to void and sleeps 6 hours nightly. Patient reports insomnia issues, discussed recommended sleep tips and stress reduction tips.  Home Safety/Smoke Alarms: Feels safe in home. Smoke alarms in place.  Living environment; residence and Firearm Safety: 1-story house/ trailer, no firearms Lives with wife, no needs for DME, good support system. Seat Belt Safety/Bike Helmet: Wears seat belt.   PSA-  Lab Results  Component Value Date   PSA 0.13 12/01/2017   PSA 0.28 11/20/2016   PSA 0.12 10/24/2015       Objective:    Vitals: BP 136/74   Pulse 65   Resp 17   Ht 5\' 9"  (1.753 m)   Wt 251 lb (113.9 kg)   SpO2 98%   BMI 37.07 kg/m   Body mass index is 37.07 kg/m.  Advanced Directives 03/16/2018 12/23/2015 12/22/2015  Does Patient Have a Medical Advance Directive? No No No  Would patient like information on creating a medical advance directive? Yes (ED - Information included in AVS) No - patient declined information No - patient declined information    Tobacco Social History   Tobacco Use  Smoking Status Former Smoker  . Packs/day: 0.50  . Years: 15.00  . Pack years: 7.50  . Types: Cigarettes  Smokeless Tobacco Never Used  Tobacco Comment   Quit 30 years ago     Counseling given: Not Answered Comment: Quit 30 years ago  Past Medical History:  Diagnosis Date  . ALLERGIC RHINITIS 09/16/2007  . ALLERGY 03/24/2007  . Allergy   . ANXIETY 03/28/2007  . Arthritis    knees, back,shoulder  . CAROTID ARTERY STENOSIS, BILATERAL 07/01/2009   yearly  checks - no current problems  . CEPHALGIA 06/08/2009  . CHEST PAIN 09/16/2007   no current problems  . COLONIC POLYPS, HX OF 09/16/2007  . COPD (chronic obstructive pulmonary disease) ([place]) 05/12/2017   mild per patient - no inhaler  . DEPRESSION 03/28/2007  . Dizziness and giddiness 06/08/2009  . DYSPNEA/SHORTNESS OF BREATH 09/16/2007  . ERECTILE DYSFUNCTION 03/28/2007  . ESOPHAGEAL STRICTURE 09/16/2007  . ESOPHAGITIS 03/24/2007  . GERD 03/24/2007  . GLUCOSE INTOLERANCE 09/16/2007  . HEMORRHOIDS, INTERNAL 03/24/2007  . HYPERCHOLESTEROLEMIA 03/24/2007  . HYPERLIPIDEMIA 09/16/2007  . HYPERTENSION 03/28/2007  . 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 Impaired glucose tolerance 10/04/2010  . LOW BACK PAIN 03/28/2007  . MAGNETIC RESONANCE IMAGING, BRAIN, ABNORMAL 06/26/2009  . NECK PAIN, LEFT 06/20/2010  . OBESITY 03/24/2007  . OTITIS MEDIA, LEFT 06/10/2009  . Palpitations 06/20/2010   no current problems  . PVD 06/25/2009  . RASH-NONVESICULAR 06/17/2009  . URI 08/16/2009   Past Surgical History:  Procedure Laterality Date  . COLONOSCOPY  03/2013   polyps/[place]  . s/p right knee arthroscopy    . UPPER GASTROINTESTINAL ENDOSCOPY     [facility]   Family History  Problem Relation Age of Onset  . Lymphoma Mother   . Heart disease Father        CHF  . Ovarian cancer Sister   . Lung cancer Sister   . Coronary artery disease Sister        CABG  .  Coronary artery disease Brother        stent, and CAD  . Colon cancer Neg Hx   . Esophageal cancer Neg Hx   . Rectal cancer Neg Hx   . Stomach cancer Neg Hx   . Colon polyps Neg Hx    Social History   Socioeconomic History  . Marital status: Married    Spouse name: Warren Weeks  . Number of children: 2  . Years of education: Not on file  . Highest education level: Not on file  Occupational History  . 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 Financial resource strain: Not hard at all  . Food insecurity:    Worry:  Never true    Inability: Never true  . Transportation needs:    Medical: No    Non-medical: No  Tobacco Use  . Smoking status: Former Smoker    Packs/day: 0.50    Years: 15.00    Pack years: 7.50    Types: Cigarettes  . Smokeless tobacco: Never Used  . Tobacco comment: Quit 30 years ago  Substance and Sexual Activity  . Alcohol use: Yes    Alcohol/week: 7.0 standard drinks    Types: 7 Cans of beer per week    Comment: beer most days  . Drug use: No  . Sexual activity: Yes  Lifestyle  . Physical activity:    Days per week: 0 days    Minutes per session: 0 min  . Stress: Not at all  Relationships  . Social connections:    Talks on phone: More than three times a week    Gets together: More than three times a week    Attends religious service: 1 to 4 times per year    Active member of club or organization: Yes    Attends meetings of clubs or organizations: More than 4 times per year    Relationship status: Married  Other Topics Concern  . Not on file  Social History Narrative  . Not on file    Outpatient Encounter Medications as of 03/16/2018  Medication Sig  . acetaminophen (TYLENOL) 650 MG CR tablet Take 650 mg by mouth every 8 (eight) hours as needed for pain.  Marland Kitchen aspirin (ECOTRIN LOW STRENGTH) 81 MG EC tablet Take 81 mg by mouth daily.    . Cyanocobalamin (B-12 PO) Take 1 tablet by mouth daily.  . diclofenac (VOLTAREN) 75 MG EC tablet Take 1 tablet (75 mg total) by mouth 2 (two) times daily. (Patient taking differently: Take 75 mg by mouth 2 (two) times daily as needed (for pain). )  . ezetimibe (ZETIA) 10 MG tablet Take 1 tablet (10 mg total) by mouth daily.  . fluticasone (FLONASE) 50 MCG/ACT nasal spray USE 2 SPRAY(S) IN EACH NOSTRIL ONCE DAILY  . hydrochlorothiazide (HYDRODIURIL) 25 MG tablet TAKE 1 TABLET BY MOUTH ONCE DAILY  . lisinopril (PRINIVIL,ZESTRIL) 20 MG tablet Take 1 tablet (20 mg total) by mouth daily.  . Melatonin 10 MG TABS Take 10 mg by mouth at  bedtime.   . metoprolol tartrate (LOPRESSOR) 50 MG tablet TAKE 1/2 (ONE-HALF) TABLET BY MOUTH TWICE DAILY  . Multiple Vitamin (ONE-A-DAY 55 PLUS PO) Take 1 tablet by mouth daily.  Marland Kitchen omeprazole (PRILOSEC) 20 MG capsule TAKE 1 CAPSULE BY MOUTH TWICE DAILY (Patient taking differently: Take 20 mg by mouth two times a day)  . rosuvastatin (CRESTOR) 40 MG tablet TAKE 1 TABLET BY MOUTH ONCE  DAILY  . sildenafil (VIAGRA) 100 MG tablet Take 1 tablet (100 mg total) by mouth as needed for erectile dysfunction.  Marland Kitchen terazosin (HYTRIN) 5 MG capsule Take 5 mg by mouth at bedtime.   . thiamine 250 MG tablet Take 250 mg by mouth daily.  . Turmeric 500 MG TABS Take by mouth daily.   No facility-administered encounter medications on file as of 03/16/2018.     Activities of Daily Living In your present state of health, do you have any difficulty performing the following activities: 03/16/2018  Hearing? N  Vision? N  Difficulty concentrating or making decisions? N  Walking or climbing stairs? N  Dressing or bathing? N  Doing errands, shopping? N  Preparing Food and eating ? N  Using the Toilet? N  In the past six months, have you accidently leaked urine? N  Do you have problems with loss of bowel control? N  Managing your Medications? N  Managing your Finances? N  Housekeeping or managing your Housekeeping? N  Some recent data might be hidden    Patient Care Team: Biagio Borg, MD as PCP - General   Assessment:   This is a routine wellness examination for Warren Weeks. Physical assessment deferred to PCP.   Exercise Activities and Dietary recommendations Current Exercise Habits: The patient does not participate in regular exercise at present, Exercise limited by: orthopedic condition(s)  Diet (meal preparation, eat out, water intake, caffeinated beverages, dairy products, fruits and vegetables): in general, a "healthy" diet  , well balanced.  Reviewed heart healthy diet. Encouraged patient to increase  daily water and healthy fluid intake. Discussed weight loss strategies.  Goals    . Patient Stated     Increase my physical activity by starting to go back to the [facility] and start to walk around my neighborhood.       Fall Risk Fall Risk  03/16/2018 12/01/2017 11/20/2016 10/24/2015 12/14/2014  Falls in the past year? Yes No No No No  Number falls in past yr: 1 - - - -  Injury with Fall? No - - - -  Follow up Falls prevention discussed - - - -    Depression Screen PHQ 2/9 Scores 03/16/2018 12/01/2017 11/20/2016 10/24/2015  PHQ - 2 Score 0 0 0 1  PHQ- 9 Score - 0 5 -    Cognitive Function       Ad8 score reviewed for issues:  Issues making decisions: no  Less interest in hobbies / activities: no  Repeats questions, stories (family complaining): no  Trouble using ordinary gadgets (microwave, computer, phone):no  Forgets the month or year: no  Mismanaging finances: no  Remembering appts: no  Daily problems with thinking and/or memory: no Ad8 score is= 0  Immunization History  Administered Date(s) Administered  . H1N1 04/22/2008  . Influenza Whole 06/10/2009  . Influenza, High Dose Seasonal PF 03/20/2016, 03/16/2017, 03/16/2018  . Influenza,inj,Quad PF,6+ Mos 04/10/2015  . Influenza-Unspecified 03/22/2012, 09/03/2014  . Pneumococcal Conjugate-13 12/12/2013  . Pneumococcal Polysaccharide-23 12/14/2014  . Td 10/03/2008  . Zoster 10/12/2012  . Zoster Recombinat (Shingrix) 10/01/2017   Screening Tests Health Maintenance  Topic Date Due  . INFLUENZA VACCINE  01/20/2018  . TETANUS/TDAP  10/04/2018  . COLONOSCOPY  03/05/2023  . Hepatitis C Screening  Completed  . PNA vac Low Risk Adult  Completed      Plan:      Continue doing brain stimulating activities (puzzles, reading, adult coloring books, staying active) to keep memory sharp.  Continue to eat heart healthy diet (full of fruits, vegetables, whole grains, lean protein, water--limit salt, fat, and sugar intake) and  increase physical activity as tolerated.   I have personally reviewed and noted the following in the patient's chart:   . Medical and social history . Use of alcohol, tobacco or illicit drugs  . Current medications and supplements . Functional ability and status . Nutritional status . Physical activity . Advanced directives . List of other physicians . Vitals . Screenings to include cognitive, depression, and falls . Referrals and appointments  In addition, I have reviewed and discussed with patient certain preventive protocols, quality metrics, and best practice recommendations. A written personalized care plan for preventive services as well as general preventive health recommendations were provided to patient.     Michiel Cowboy, RN  03/16/2018  Medical screening examination/treatment/procedure(s) were performed by non-physician practitioner and as supervising physician I was immediately available for consultation/collaboration. I agree with above. Cathlean Cower, MD

## 2018-03-16 ENCOUNTER — Ambulatory Visit (INDEPENDENT_AMBULATORY_CARE_PROVIDER_SITE_OTHER): Payer: Medicare Other | Admitting: *Deleted

## 2018-03-16 VITALS — BP 136/74 | HR 65 | Resp 17 | Ht 69.0 in | Wt 251.0 lb

## 2018-03-16 DIAGNOSIS — Z23 Encounter for immunization: Secondary | ICD-10-CM | POA: Diagnosis not present

## 2018-03-16 DIAGNOSIS — Z0000 Encounter for general adult medical examination without abnormal findings: Secondary | ICD-10-CM | POA: Diagnosis not present

## 2018-03-16 NOTE — Patient Instructions (Addendum)
Continue doing brain stimulating activities (puzzles, reading, adult coloring books, staying active) to keep memory sharp.   Continue to eat heart healthy diet (full of fruits, vegetables, whole grains, lean protein, water--limit salt, fat, and sugar intake) and increase physical activity as tolerated.   Warren Weeks , Thank you for taking time to come for your Medicare Wellness Visit. I appreciate your ongoing commitment to your health goals. Please review the following plan we discussed and let me know if I can assist you in the future.   These are the goals we discussed: Goals    . Patient Stated     Increase my physical activity by starting to go back to the [facility] and start to walk around my neighborhood.       This is a list of the screening recommended for you and due dates:  Health Maintenance  Topic Date Due  . Flu Shot  01/20/2018  . Tetanus Vaccine  10/04/2018  . Colon Cancer Screening  03/05/2023  .  Hepatitis C: One time screening is recommended by Center for Disease Control  (CDC) for  adults born from [birth year] through 1963.   Completed  . Pneumonia vaccines  Completed     Health Maintenance, Male A healthy lifestyle and preventive care is important for your health and wellness. Ask your health care provider about what schedule of regular examinations is right for you. What should I know about weight and diet? Eat a Healthy Diet  Eat plenty of vegetables, fruits, whole grains, low-fat dairy products, and lean protein.  Do not eat a lot of foods high in solid fats, added sugars, or salt.  Maintain a Healthy Weight Regular exercise can help you achieve or maintain a healthy weight. You should:  Do at least 150 minutes of exercise each week. The exercise should increase your heart rate and make you sweat (moderate-intensity exercise).  Do strength-training exercises at least twice a week.  Watch Your Levels of Cholesterol and Blood Lipids  Have your blood tested for  lipids and cholesterol every 5 years starting at 70 years of age. If you are at high risk for heart disease, you should start having your blood tested when you are 70 years old. You may need to have your cholesterol levels checked more often if: ? Your lipid or cholesterol levels are high. ? You are older than 70 years of age. ? You are at high risk for heart disease.  What should I know about cancer screening? Many types of cancers can be detected early and may often be prevented. Lung Cancer  You should be screened every year for lung cancer if: ? You are a current smoker who has smoked for at least 30 years. ? You are a former smoker who has quit within the past 15 years.  Talk to your health care provider about your screening options, when you should start screening, and how often you should be screened.  Colorectal Cancer  Routine colorectal cancer screening usually begins at 70 years of age and should be repeated every 5-10 years until you are 70 years old. You may need to be screened more often if early forms of precancerous polyps or small growths are found. Your health care provider may recommend screening at an earlier age if you have risk factors for colon cancer.  Your health care provider may recommend using home test kits to check for hidden blood in the stool.  A small camera at the end of a  tube can be used to examine your colon (sigmoidoscopy or colonoscopy). This checks for the earliest forms of colorectal cancer.  Prostate and Testicular Cancer  Depending on your age and overall health, your health care provider may do certain tests to screen for prostate and testicular cancer.  Talk to your health care provider about any symptoms or concerns you have about testicular or prostate cancer.  Skin Cancer  Check your skin from head to toe regularly.  Tell your health care provider about any new moles or changes in moles, especially if: ? There is a change in a mole's  size, shape, or color. ? You have a mole that is larger than a pencil eraser.  Always use sunscreen. Apply sunscreen liberally and repeat throughout the day.  Protect yourself by wearing long sleeves, pants, a wide-brimmed hat, and sunglasses when outside.  What should I know about heart disease, diabetes, and high blood pressure?  If you are 37-96 years of age, have your blood pressure checked every 3-5 years. If you are 31 years of age or older, have your blood pressure checked every year. You should have your blood pressure measured twice-once when you are at a hospital or clinic, and once when you are not at a hospital or clinic. Record the average of the two measurements. To check your blood pressure when you are not at a hospital or clinic, you can use: ? An automated blood pressure machine at a pharmacy. ? A home blood pressure monitor.  Talk to your health care provider about your target blood pressure.  If you are between 25-35 years old, ask your health care provider if you should take aspirin to prevent heart disease.  Have regular diabetes screenings by checking your fasting blood sugar level. ? If you are at a normal weight and have a low risk for diabetes, have this test once every three years after the age of 52. ? If you are overweight and have a high risk for diabetes, consider being tested at a younger age or more often.  A one-time screening for abdominal aortic aneurysm (AAA) by ultrasound is recommended for men aged 51-75 years who are current or former smokers. What should I know about preventing infection? Hepatitis B If you have a higher risk for hepatitis B, you should be screened for this virus. Talk with your health care provider to find out if you are at risk for hepatitis B infection. Hepatitis C Blood testing is recommended for:  Everyone born from [birth year] through 1963.  Anyone with known risk factors for hepatitis C.  Sexually Transmitted Diseases  (STDs)  You should be screened each year for STDs including gonorrhea and chlamydia if: ? You are sexually active and are younger than 70 years of age. ? You are older than 70 years of age and your health care provider tells you that you are at risk for this type of infection. ? Your sexual activity has changed since you were last screened and you are at an increased risk for chlamydia or gonorrhea. Ask your health care provider if you are at risk.  Talk with your health care provider about whether you are at high risk of being infected with HIV. Your health care provider may recommend a prescription medicine to help prevent HIV infection.  What else can I do?  Schedule regular health, dental, and eye exams.  Stay current with your vaccines (immunizations).  Do not use any tobacco products, such as cigarettes, chewing tobacco,  and e-cigarettes. If you need help quitting, ask your health care provider.  Limit alcohol intake to no more than 2 drinks per day. One drink equals 12 ounces of beer, 5 ounces of wine, or 1 ounces of hard liquor.  Do not use street drugs.  Do not share needles.  Ask your health care provider for help if you need support or information about quitting drugs.  Tell your health care provider if you often feel depressed.  Tell your health care provider if you have ever been abused or do not feel safe at home. This information is not intended to replace advice given to you by your health care provider. Make sure you discuss any questions you have with your health care provider. Document Released: 12/05/2007 Document Revised: 02/05/2016 Document Reviewed: 03/12/2015 Elsevier Interactive Patient Education  2018 [place]. Influenza Virus Vaccine injection What is this medicine? INFLUENZA VIRUS VACCINE (in floo EN zuh VAHY ruhs vak SEEN) helps to reduce the risk of getting influenza also known as the flu. The vaccine only helps protect you against some strains of  the flu. This medicine may be used for other purposes; ask your health care provider or pharmacist if you have questions. COMMON BRAND NAME(S): Afluria, Agriflu, Alfuria, FLUAD, Fluarix, Fluarix Quadrivalent, Flublok, Flublok Quadrivalent, FLUCELVAX, Flulaval, Fluvirin, Fluzone, Fluzone High-Dose, Fluzone Intradermal What should I tell my health care provider before I take this medicine? They need to know if you have any of these conditions: -bleeding disorder like hemophilia -fever or infection -Guillain-Barre syndrome or other neurological problems -immune system problems -infection with the human immunodeficiency virus (HIV) or AIDS -low blood platelet counts -multiple sclerosis -an unusual or allergic reaction to influenza virus vaccine, latex, other medicines, foods, dyes, or preservatives. Different brands of vaccines contain different allergens. Some may contain latex or eggs. Talk to your doctor about your allergies to make sure that you get the right vaccine. -pregnant or trying to get pregnant -breast-feeding How should I use this medicine? This vaccine is for injection into a muscle or under the skin. It is given by a health care professional. A copy of Vaccine Information Statements will be given before each vaccination. Read this sheet carefully each time. The sheet may change frequently. Talk to your healthcare provider to see which vaccines are right for you. Some vaccines should not be used in all age groups. Overdosage: If you think you have taken too much of this medicine contact a poison control center or emergency room at once. NOTE: This medicine is only for you. Do not share this medicine with others. What if I miss a dose? This does not apply. What may interact with this medicine? -chemotherapy or radiation therapy -medicines that lower your immune system like etanercept, anakinra, infliximab, and adalimumab -medicines that treat or prevent blood clots like  warfarin -phenytoin -steroid medicines like prednisone or cortisone -theophylline -vaccines This list may not describe all possible interactions. Give your health care provider a list of all the medicines, herbs, non-prescription drugs, or dietary supplements you use. Also tell them if you smoke, drink alcohol, or use illegal drugs. Some items may interact with your medicine. What should I watch for while using this medicine? Report any side effects that do not go away within 3 days to your doctor or health care professional. Call your health care provider if any unusual symptoms occur within 6 weeks of receiving this vaccine. You may still catch the flu, but the illness is not usually as  bad. You cannot get the flu from the vaccine. The vaccine will not protect against colds or other illnesses that may cause fever. The vaccine is needed every year. What side effects may I notice from receiving this medicine? Side effects that you should report to your doctor or health care professional as soon as possible: -allergic reactions like skin rash, itching or hives, swelling of the face, lips, or tongue Side effects that usually do not require medical attention (report to your doctor or health care professional if they continue or are bothersome): -fever -headache -muscle aches and pains -pain, tenderness, redness, or swelling at the injection site -tiredness This list may not describe all possible side effects. Call your doctor for medical advice about side effects. You may report side effects to FDA at 1-800-FDA-1088. Where should I keep my medicine? The vaccine will be given by a health care professional in a clinic, pharmacy, doctor's office, or other health care setting. You will not be given vaccine doses to store at home. NOTE: This sheet is a summary. It may not cover all possible information. If you have questions about this medicine, talk to your doctor, pharmacist, or health care  provider.  2018 Elsevier/Gold Standard (2014-12-28 10:07:28)

## 2018-04-18 ENCOUNTER — Other Ambulatory Visit: Payer: Self-pay | Admitting: Internal Medicine

## 2018-06-03 ENCOUNTER — Encounter: Payer: Self-pay | Admitting: Internal Medicine

## 2018-06-03 ENCOUNTER — Ambulatory Visit: Payer: Medicare Other | Admitting: Internal Medicine

## 2018-06-03 VITALS — BP 126/86 | HR 72 | Temp 97.9°F | Ht 69.0 in | Wt 249.0 lb

## 2018-06-03 DIAGNOSIS — Z0000 Encounter for general adult medical examination without abnormal findings: Secondary | ICD-10-CM

## 2018-06-03 DIAGNOSIS — I10 Essential (primary) hypertension: Secondary | ICD-10-CM

## 2018-06-03 DIAGNOSIS — L301 Dyshidrosis [pompholyx]: Secondary | ICD-10-CM | POA: Insufficient documentation

## 2018-06-03 DIAGNOSIS — R7302 Impaired glucose tolerance (oral): Secondary | ICD-10-CM

## 2018-06-03 DIAGNOSIS — E785 Hyperlipidemia, unspecified: Secondary | ICD-10-CM | POA: Diagnosis not present

## 2018-06-03 HISTORY — DX: Dyshidrosis (pompholyx): L30.1

## 2018-06-03 LAB — POCT GLYCOSYLATED HEMOGLOBIN (HGB A1C): HEMOGLOBIN A1C: 6 % — AB (ref 4.0–5.6)

## 2018-06-03 MED ORDER — METHYLPREDNISOLONE ACETATE 80 MG/ML IJ SUSP
80.0000 mg | Freq: Once | INTRAMUSCULAR | Status: AC
  Administered 2018-06-03: 80 mg via INTRAMUSCULAR

## 2018-06-03 MED ORDER — TRIAMCINOLONE ACETONIDE 0.1 % EX CREA: 1.0000 "application " | TOPICAL_CREAM | Freq: Two times a day (BID) | CUTANEOUS | 2 refills | Status: DC

## 2018-06-03 NOTE — Assessment & Plan Note (Signed)
stable overall by history and exam, recent data reviewed with pt, and pt to continue medical treatment as before,  to f/u any worsening symptoms or concerns  

## 2018-06-03 NOTE — Patient Instructions (Signed)
Your A1c was OK today  Please take all new medication as prescribed - the steroid cream  You had the steroid shot today  Please continue all other medications as before, and refills have been done if requested.  Please have the pharmacy call with any other refills you may need.  Please continue your efforts at being more active, low cholesterol diet, and weight control.  You are otherwise up to date with prevention measures today.  Please keep your appointments with your specialists as you may have planned  Please return in 6 months, or sooner if needed, with Lab testing done 3-5 days before

## 2018-06-03 NOTE — Assessment & Plan Note (Signed)
Mild to mod, for depomedrol IM 80, and triam cr prn,  to f/u any worsening symptoms or concerns

## 2018-06-03 NOTE — Progress Notes (Signed)
Subjective:    Patient ID: Warren Weeks, male    DOB: 1945/11/20, 72 y.o.   MRN: [id number]  HPI  Here to f/u; overall doing ok,  Pt denies chest pain, increasing sob or doe, wheezing, orthopnea, PND, increased LE swelling, palpitations, dizziness or syncope.  Pt denies new neurological symptoms such as new headache, or facial or extremity weakness or numbness.  Pt denies polydipsia, polyuria, or low sugar episode.  Pt states overall good compliance with meds, mostly trying to follow appropriate diet, with wt overall stable,  but little exercise however.   Wt Readings from Last 3 Encounters:  06/03/18 249 lb (112.9 kg)  03/16/18 251 lb (113.9 kg)  03/04/18 249 lb (112.9 kg)  Does have bilat hand eczematous changes and cracking of fingertips x 2 mo getting worse, now mod to severe, without fever, nothing makes better or worse Past Medical History:  Diagnosis Date  . ALLERGIC RHINITIS 09/16/2007  . ALLERGY 03/24/2007  . Allergy   . ANXIETY 03/28/2007  . Arthritis    knees, back,shoulder  . CAROTID ARTERY STENOSIS, BILATERAL 07/01/2009   yearly checks - no current problems  . CEPHALGIA 06/08/2009  . CHEST PAIN 09/16/2007   no current problems  . COLONIC POLYPS, HX OF 09/16/2007  . COPD (chronic obstructive pulmonary disease) ([place]) 05/12/2017   mild per patient - no inhaler  . DEPRESSION 03/28/2007  . Dizziness and giddiness 06/08/2009  . 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 DYSPNEA/SHORTNESS OF BREATH 09/16/2007  . ERECTILE DYSFUNCTION 03/28/2007  . ESOPHAGEAL STRICTURE 09/16/2007  . ESOPHAGITIS 03/24/2007  . GERD 03/24/2007  . GLUCOSE INTOLERANCE 09/16/2007  . HEMORRHOIDS, INTERNAL 03/24/2007  . HYPERCHOLESTEROLEMIA 03/24/2007  . HYPERLIPIDEMIA 09/16/2007  . HYPERTENSION 03/28/2007  . 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 Impaired glucose tolerance 10/04/2010  . LOW BACK PAIN 03/28/2007  . MAGNETIC RESONANCE IMAGING, BRAIN, ABNORMAL 06/26/2009  . NECK PAIN, LEFT 06/20/2010  . OBESITY 03/24/2007  .  OTITIS MEDIA, LEFT 06/10/2009  . Palpitations 06/20/2010   no current problems  . PVD 06/25/2009  . RASH-NONVESICULAR 06/17/2009  . URI 08/16/2009   Past Surgical History:  Procedure Laterality Date  . COLONOSCOPY  03/2013   polyps/[place]  . s/p right knee arthroscopy    . UPPER GASTROINTESTINAL ENDOSCOPY     gerd    reports that he has quit smoking. His smoking use included cigarettes. He has a 7.50 pack-year smoking history. He has never used smokeless tobacco. He reports current alcohol use of about 7.0 standard drinks of alcohol per week. He reports that he does not use drugs. family history includes Coronary artery disease in his brother and sister; Heart disease in his father; Lung cancer in his sister; Lymphoma in his mother; Ovarian cancer in his sister. Allergies  Allergen Reactions  . Cefuroxime Axetil Rash   Current Outpatient Medications on File Prior to Visit  Medication Sig Dispense Refill  . acetaminophen (TYLENOL) 650 MG CR tablet Take 650 mg by mouth every 8 (eight) hours as needed for pain.    Marland Kitchen aspirin (ECOTRIN LOW STRENGTH) 81 MG EC tablet Take 81 mg by mouth daily.      . Cyanocobalamin (B-12 PO) Take 1 tablet by mouth daily.    . diclofenac (VOLTAREN) 75 MG EC tablet Take 1 tablet (75 mg total) by mouth 2 (two) times daily. (Patient taking differently: Take 75 mg by mouth 2 (two) times daily as needed (for pain). ) 180 tablet 1  . ezetimibe (ZETIA) 10 MG  tablet Take 1 tablet (10 mg total) by mouth daily. 90 tablet 3  . fluticasone (FLONASE) 50 MCG/ACT nasal spray USE 2 SPRAY(S) IN EACH NOSTRIL ONCE DAILY 16 g 5  . hydrochlorothiazide (HYDRODIURIL) 25 MG tablet TAKE 1 TABLET BY MOUTH ONCE DAILY 90 tablet 3  . lisinopril (PRINIVIL,ZESTRIL) 20 MG tablet Take 1 tablet (20 mg total) by mouth daily. 90 tablet 3  . Melatonin 10 MG TABS Take 10 mg by mouth at bedtime.     . metoprolol tartrate (LOPRESSOR) 50 MG tablet TAKE 1/2 (ONE-HALF) TABLET BY MOUTH TWICE DAILY 90 tablet  3  . Multiple Vitamin (ONE-A-DAY 55 PLUS PO) Take 1 tablet by mouth daily.    Marland Kitchen omeprazole (PRILOSEC) 20 MG capsule TAKE 1 CAPSULE BY MOUTH TWICE DAILY 180 capsule 1  . rosuvastatin (CRESTOR) 40 MG tablet TAKE 1 TABLET BY MOUTH ONCE DAILY 90 tablet 2  . sildenafil (VIAGRA) 100 MG tablet Take 1 tablet (100 mg total) by mouth as needed for erectile dysfunction. 10 tablet 11  . terazosin (HYTRIN) 5 MG capsule Take 5 mg by mouth at bedtime.     . thiamine 250 MG tablet Take 250 mg by mouth daily.    . Turmeric 500 MG TABS Take by mouth daily.     No current facility-administered medications on file prior to visit.    Review of Systems  Constitutional: Negative for other unusual diaphoresis or sweats HENT: Negative for ear discharge or swelling Eyes: Negative for other worsening visual disturbances Respiratory: Negative for stridor or other swelling  Gastrointestinal: Negative for worsening distension or other blood Genitourinary: Negative for retention or other urinary change Musculoskeletal: Negative for other MSK pain or swelling Skin: Negative for color change or other new lesions Neurological: Negative for worsening tremors and other numbness  Psychiatric/Behavioral: Negative for worsening agitation or other fatigue All other system neg per pt    Objective:   Physical Exam BP 126/86   Pulse 72   Temp 97.9 F (36.6 C) (Oral)   Ht 5\' 9"  (1.753 m)   Wt 249 lb (112.9 kg)   SpO2 93%   BMI 36.77 kg/m  VS noted,  Constitutional: Pt appears in NAD HENT: Head: NCAT.  Right Ear: External ear normal.  Left Ear: External ear normal.  Eyes: . Pupils are equal, round, and reactive to light. Conjunctivae and EOM are normal Nose: without d/c or deformity Neck: Neck supple. Gross normal ROM Cardiovascular: Normal rate and regular rhythm.   Pulmonary/Chest: Effort normal and breath sounds without rales or wheezing.  Abd:  Soft, NT, ND, + BS, no organomegaly Neurological: Pt is alert. At  baseline orientation, motor grossly intact Skin: Skin is warm. + eczematous rash to palms and fingers with cracking skin of tips tender, no other new lesions, no LE edema Psychiatric: Pt behavior is normal without agitation  No other exam findings Lab Results  Component Value Date   WBC 5.3 12/01/2017   HGB 13.7 12/01/2017   HCT 40.7 12/01/2017   PLT 155.0 12/01/2017   GLUCOSE 117 (H) 12/01/2017   CHOL 160 12/01/2017   TRIG 144.0 12/01/2017   HDL 50.30 12/01/2017   LDLDIRECT 176.0 04/03/2011   LDLCALC 81 12/01/2017   ALT 46 12/01/2017   AST 29 12/01/2017   NA 142 12/01/2017   K 4.2 12/01/2017   CL 105 12/01/2017   CREATININE 1.11 12/01/2017   BUN 17 12/01/2017   CO2 30 12/01/2017   TSH 2.29 12/01/2017   PSA  0.13 12/01/2017   HGBA1C 6.0 (A) 06/03/2018      POCT A1c - POCT glycosylated hemoglobin (Hb A1C)  Order: [id number]  Status:  Final result Visible to patient:  No (Not Released) Dx:  Impaired glucose tolerance   Ref Range & Units 09:49 (06/03/18) 58mo ago (12/01/17) 67yr ago (11/20/16) 70yr ago (12/23/15) 44yr ago (10/24/15) 8yr ago (12/14/14) 63yr ago (06/14/14)  Hemoglobin A1C 4.0 - 5.6 % 6.0Abnormal   6.4 R, CM 6.4 R, CM 6.1High  R, CM 6.5 R, CM 6.1 R, CM 6.5 R, CM           Assessment & Plan:

## 2018-06-24 ENCOUNTER — Other Ambulatory Visit: Payer: Self-pay | Admitting: Internal Medicine

## 2018-08-12 ENCOUNTER — Ambulatory Visit: Payer: Medicare Other | Admitting: Nurse Practitioner

## 2018-08-12 ENCOUNTER — Encounter: Payer: Self-pay | Admitting: Nurse Practitioner

## 2018-08-12 VITALS — BP 120/70 | HR 99 | Temp 98.4°F | Ht 69.0 in | Wt 250.0 lb

## 2018-08-12 DIAGNOSIS — R6889 Other general symptoms and signs: Secondary | ICD-10-CM | POA: Diagnosis not present

## 2018-08-12 LAB — POC INFLUENZA A&B (BINAX/QUICKVUE)
Influenza A, POC: NEGATIVE
Influenza B, POC: NEGATIVE

## 2018-08-12 NOTE — Progress Notes (Signed)
Warren Weeks is a 73 y.o. male with the following history as recorded in Epic[facility]:  Patient Active Problem List   Diagnosis Date Noted  . 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 COPD (chronic obstructive pulmonary disease) ([place]) 05/12/2017  . Pleurisy 03/16/2017  . Bilateral leg pain 11/22/2016  . Insomnia 10/13/2016  . Cough 07/22/2016  . Wheezing 07/22/2016  . Peripheral edema 02/21/2016  . Venous insufficiency 02/05/2016  . Low back pain radiating to lower extremity 02/05/2016  . Vertigo   . Hypersomnolence 10/24/2015  . 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 Chest pain 01/18/2013  . Hematochezia 10/12/2012  . Viral illness 05/26/2012  . Palpitations 01/05/2012  . 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 Fatigue 04/09/2011  . Rash 12/22/2010  . Encounter for long-term (current) use of high-risk medication 10/08/2010  . Impaired glucose tolerance 10/04/2010  . Encounter for well adult exam with abnormal findings 10/04/2010  . Carotid artery stenosis 07/01/2009  . PVD 06/25/2009  . Allergic rhinitis 09/16/2007  . ESOPHAGEAL STRICTURE 09/16/2007  . COLONIC POLYPS, HX OF 09/16/2007  . ANXIETY 03/28/2007  . ERECTILE DYSFUNCTION 03/28/2007  . Hyperlipidemia 03/28/2007  . Essential hypertension 03/28/2007  . LOW BACK PAIN 03/28/2007  . OBESITY 03/24/2007  . HEMORRHOIDS, INTERNAL 03/24/2007  . ESOPHAGITIS 03/24/2007  . GERD 03/24/2007    Current Outpatient Medications  Medication Sig Dispense Refill  . acetaminophen (TYLENOL) 650 MG CR tablet Take 650 mg by mouth every 8 (eight) hours as needed for pain.    Marland Kitchen aspirin (ECOTRIN LOW STRENGTH) 81 MG EC tablet Take 81 mg by mouth daily.      . Cyanocobalamin (B-12 PO) Take 1 tablet by mouth daily.    . diclofenac (VOLTAREN) 75 MG EC tablet Take 1 tablet (75 mg total) by mouth 2 (two) times daily. (Patient taking differently: Take 75 mg by mouth 2 (two) times daily as needed (for pain). ) 180 tablet 1  . ezetimibe (ZETIA) 10 MG tablet TAKE 1  TABLET BY MOUTH ONCE DAILY 90 tablet 1  . fluticasone (FLONASE) 50 MCG/ACT nasal spray USE 2 SPRAY(S) IN EACH NOSTRIL ONCE DAILY 16 g 5  . hydrochlorothiazide (HYDRODIURIL) 25 MG tablet TAKE 1 TABLET BY MOUTH ONCE DAILY 90 tablet 3  . lisinopril (PRINIVIL,ZESTRIL) 20 MG tablet Take 1 tablet (20 mg total) by mouth daily. 90 tablet 3  . Melatonin 10 MG TABS Take 10 mg by mouth at bedtime.     . metoprolol tartrate (LOPRESSOR) 50 MG tablet TAKE 1/2 (ONE-HALF) TABLET BY MOUTH TWICE DAILY 90 tablet 3  . Multiple Vitamin (ONE-A-DAY 55 PLUS PO) Take 1 tablet by mouth daily.    Marland Kitchen omeprazole (PRILOSEC) 20 MG capsule TAKE 1 CAPSULE BY MOUTH TWICE DAILY 180 capsule 1  . rosuvastatin (CRESTOR) 40 MG tablet TAKE 1 TABLET BY MOUTH ONCE DAILY 90 tablet 2  . sildenafil (VIAGRA) 100 MG tablet Take 1 tablet (100 mg total) by mouth as needed for erectile dysfunction. 10 tablet 11  . terazosin (HYTRIN) 5 MG capsule Take 5 mg by mouth at bedtime.     . thiamine 250 MG tablet Take 250 mg by mouth daily.    Marland Kitchen triamcinolone cream (KENALOG) 0.1 % Apply 1 application topically 2 (two) times daily. 30 g 2  . Turmeric 500 MG TABS Take by mouth daily.     No current facility-administered medications for this visit.     Allergies: Cefuroxime axetil  Past Medical History:  Diagnosis Date  . ALLERGIC RHINITIS 09/16/2007  .  ALLERGY 03/24/2007  . Allergy   . ANXIETY 03/28/2007  . Arthritis    knees, back,shoulder  . CAROTID ARTERY STENOSIS, BILATERAL 07/01/2009   yearly checks - no current problems  . CEPHALGIA 06/08/2009  . CHEST PAIN 09/16/2007   no current problems  . COLONIC POLYPS, HX OF 09/16/2007  . COPD (chronic obstructive pulmonary disease) ([place]) 05/12/2017   mild per patient - no inhaler  . DEPRESSION 03/28/2007  . Dizziness and giddiness 06/08/2009  . 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 DYSPNEA/SHORTNESS OF BREATH 09/16/2007  . ERECTILE DYSFUNCTION 03/28/2007  . ESOPHAGEAL STRICTURE 09/16/2007  . ESOPHAGITIS  03/24/2007  . GERD 03/24/2007  . GLUCOSE INTOLERANCE 09/16/2007  . HEMORRHOIDS, INTERNAL 03/24/2007  . HYPERCHOLESTEROLEMIA 03/24/2007  . HYPERLIPIDEMIA 09/16/2007  . HYPERTENSION 03/28/2007  . 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 Impaired glucose tolerance 10/04/2010  . LOW BACK PAIN 03/28/2007  . MAGNETIC RESONANCE IMAGING, BRAIN, ABNORMAL 06/26/2009  . NECK PAIN, LEFT 06/20/2010  . OBESITY 03/24/2007  . OTITIS MEDIA, LEFT 06/10/2009  . Palpitations 06/20/2010   no current problems  . PVD 06/25/2009  . RASH-NONVESICULAR 06/17/2009  . URI 08/16/2009    Past Surgical History:  Procedure Laterality Date  . COLONOSCOPY  03/2013   polyps/[place]  . s/p right knee arthroscopy    . UPPER GASTROINTESTINAL ENDOSCOPY     gerd    Family History  Problem Relation Age of Onset  . Lymphoma Mother   . Heart disease Father        CHF  . Ovarian cancer Sister   . Lung cancer Sister   . Coronary artery disease Sister        CABG  . Coronary artery disease Brother        stent, and CAD  . Colon cancer Neg Hx   . Esophageal cancer Neg Hx   . Rectal cancer Neg Hx   . Stomach cancer Neg Hx   . Colon polyps Neg Hx     Social History   Tobacco Use  . Smoking status: Former Smoker    Packs/day: 0.50    Years: 15.00    Pack years: 7.50    Types: Cigarettes  . Smokeless tobacco: Never Used  . Tobacco comment: Quit 30 years ago  Substance Use Topics  . Alcohol use: Yes    Alcohol/week: 7.0 standard drinks    Types: 7 Cans of beer per week    Comment: beer most days     Subjective:  Warren Weeks is here today requesting evaluation of acute complaint of cold symptoms, which first began about 2 days ago, Wednesday night. Reports: chills, body aches, headaches, sinus pain and pressure, nasal congestion Denies: syncope, confusion, sore throat, cp, sob, cough, abdominal pain, n/v/d. Smoker? Former smoker, quit 20 years ago  Tried at home: flonase, nasal saline solution with minimal  improvement Improvement/worsening since onset? Feeling a little better today Recent travel: no Sick contacts: no   ROS- See HPI  Objective:  Vitals:   08/12/18 1621  BP: 120/70  Pulse: 99  Temp: 98.4 F (36.9 C)  TempSrc: Oral  SpO2: 94%  Weight: 250 lb (113.4 kg)  Height: 5\' 9"  (1.753 m)    General: Well developed, well nourished, in no acute distress  Skin : Warm and dry.  Head: Normocephalic and atraumatic  Eyes: Sclera and conjunctiva clear; pupils round and reactive to light; extraocular movements intact  Ears: External normal; canals clear; tympanic membranes bulging bilaterally- no erythema, effusion Oropharynx: Pink, supple.  No suspicious lesions  Neck: Supple without thyromegaly, adenopathy  Lungs: Respirations unlabored; clear to auscultation bilaterally without wheeze, rales, rhonchi  CVS exam: normal rate and regular rhythm.   Extremities: No edema, cyanosis, clubbing  Vessels: Symmetric bilaterally  Neurologic: Alert and oriented; speech intact; face symmetrical; moves all extremities well; CNII-XII intact without focal deficit  Psychiatric: Normal mood and affect.   Assessment:  1. Flu-like symptoms     Plan:   POCT flu negative today Suspect symptoms are viral at this point which we discussed Home management, OTC medications, red flags and return precautions including when to seek immediate care discussed and printed on AVS He will f/u for new, worsening symptoms or if symptoms are not better in 1 week   No follow-ups on file.  Orders Placed This Encounter  Procedures  . POC Influenza A&B (Binax test)    Requested Prescriptions    No prescriptions requested or ordered in this encounter

## 2018-08-12 NOTE — Patient Instructions (Signed)
For nasal and head congestion may take Sudafed PE 10 mg every 4 hour as needed. Saline nasal spray used frequently. For drainage may use Allegra, Claritin or Zyrtec. If you need stronger medicine to stop drainage may take Chlor-Trimeton 2-4 mg every 4 hours. This may cause drowsiness. Ibuprofen 600 mg every 6 hours as needed for pain, discomfort or fever. Drink plenty of fluids and stay well-hydrated.  Please follow up for fevers over 101, if your symptoms get worse, or if your symptoms are not better in 1 week   Upper Respiratory Infection, Adult An upper respiratory infection (URI) affects the nose, throat, and upper air passages. URIs are caused by germs (viruses). The most common type of URI is often called "the common cold." Medicines cannot cure URIs, but you can do things at home to relieve your symptoms. URIs usually get better within 7-10 days. Follow these instructions at home: Activity  Rest as needed.  If you have a fever, stay home from work or school until your fever is gone, or until your doctor says you may return to work or school. ? You should stay home until you cannot spread the infection anymore (you are not contagious). ? Your doctor may have you wear a face mask so you have less risk of spreading the infection. Relieving symptoms  Gargle with a salt-water mixture 3-4 times a day or as needed. To make a salt-water mixture, completely dissolve -1 tsp of salt in 1 cup of warm water.  Use a cool-mist humidifier to add moisture to the air. This can help you breathe more easily. Eating and drinking   Drink enough fluid to keep your pee (urine) pale yellow.  Eat soups and other clear broths. General instructions   Take over-the-counter and prescription medicines only as told by your doctor. These include cold medicines, fever reducers, and cough suppressants.  Do not use any products that contain nicotine or tobacco. These include cigarettes and e-cigarettes. If  you need help quitting, ask your doctor.  Avoid being where people are smoking (avoid secondhand smoke).  Make sure you get regular shots and get the flu shot every year.  Keep all follow-up visits as told by your doctor. This is important. How to avoid spreading infection to others   Wash your hands often with soap and water. If you do not have soap and water, use hand sanitizer.  Avoid touching your mouth, face, eyes, or nose.  Cough or sneeze into a tissue or your sleeve or elbow. Do not cough or sneeze into your hand or into the air. Contact a doctor if:  You are getting worse, not better.  You have any of these: ? A fever. ? Chills. ? Brown or red mucus in your nose. ? Yellow or brown fluid (discharge)coming from your nose. ? Pain in your face, especially when you bend forward. ? Swollen neck glands. ? Pain with swallowing. ? White areas in the back of your throat. Get help right away if:  You have shortness of breath that gets worse.  You have very bad or constant: ? Headache. ? Ear pain. ? Pain in your forehead, behind your eyes, and over your cheekbones (sinus pain). ? Chest pain.  You have long-lasting (chronic) lung disease along with any of these: ? Wheezing. ? Long-lasting cough. ? Coughing up blood. ? A change in your usual mucus.  You have a stiff neck.  You have changes in your: ? Vision. ? Hearing. ? Thinking. ?  Mood. Summary  An upper respiratory infection (URI) is caused by a germ called a virus. The most common type of URI is often called "the common cold."  URIs usually get better within 7-10 days.  Take over-the-counter and prescription medicines only as told by your doctor. This information is not intended to replace advice given to you by your health care provider. Make sure you discuss any questions you have with your health care provider. Document Released: 11/25/2007 Document Revised: 01/29/2017 Document Reviewed:  01/29/2017 Elsevier Interactive Patient Education  2019 [employer].

## 2018-09-06 ENCOUNTER — Other Ambulatory Visit: Payer: Self-pay | Admitting: Internal Medicine

## 2018-09-13 ENCOUNTER — Ambulatory Visit: Payer: Self-pay | Admitting: Internal Medicine

## 2018-09-13 ENCOUNTER — Telehealth: Payer: Self-pay

## 2018-09-13 NOTE — Telephone Encounter (Signed)
Pt contacted and screened for covid-19.   Denies fever, chills, SOB, cough, n/v/d.  Pt states that he has a little cough and sore throat soreness, however, it is not a new issue and has dealt with this in this past. Pt states that PCP normally prescribes an antibiotic and a shot in office that clears it up.   Pt has been advised that no additional visitors are allowed during the [place]. He expressed understanding.

## 2018-09-13 NOTE — Telephone Encounter (Signed)
He called c/o his legs swelling.  He mentioned his left leg has chronic swelling on and off and that Dr. Jenny Reichmann has seen him for that before.  See triage notes.  I made him an appt with Dr. Jenny Reichmann for 09/14/2018 at 9:40.   He wasn't able to come in this afternoon even though Dr. Jenny Reichmann had openings.  Reason for Disposition . [1] MILD swelling of both ankles (i.e., pedal edema) AND [2] new onset or worsening  Answer Assessment - Initial Assessment Questions 1. ONSET: "When did the swelling start?" (e.g., minutes, hours, days)     It started Friday. 2. LOCATION: "What part of the leg is swollen?"  "Are both legs swollen or just one leg?"     Both legs my feet and ankles are swollen to the muscle of my calf.   My left leg is chronic.     3. SEVERITY: "How bad is the swelling?" (e.g., localized; mild, moderate, severe)  - Localized - small area of swelling localized to one leg  - MILD pedal edema - swelling limited to foot and ankle, pitting edema < 1/4 inch (6 mm) deep, rest and elevation eliminate most or all swelling  - MODERATE edema - swelling of lower leg to knee, pitting edema > 1/4 inch (6 mm) deep, rest and elevation only partially reduce swelling  - SEVERE edema - swelling extends above knee, facial or hand swelling present      When I press on the swelling it leaves am indention.    4. REDNESS: "Does the swelling look red or infected?"     No infection or wounds.   Not diabetic.   No heart problems.   No kidney problems. 5. PAIN: "Is the swelling painful to touch?" If so, ask: "How painful is it?"   (Scale 1-10; mild, moderate or severe)     My legs feel heavy.   My knees bother me. 6. FEVER: "Do you have a fever?" If so, ask: "What is it, how was it measured, and when did it start?"      No 7. CAUSE: "What do you think is causing the leg swelling?"     I don't know. 8. MEDICAL HISTORY: "Do you have a history of heart failure, kidney disease, liver failure, or cancer?"     See above no  history. 9. RECURRENT SYMPTOM: "Have you had leg swelling before?" If so, ask: "When was the last time?" "What happened that time?"     Yes especially the left leg. 10. OTHER SYMPTOMS: "Do you have any other symptoms?" (e.g., chest pain, difficulty breathing)       No 11. PREGNANCY: "Is there any chance you are pregnant?" "When was your last menstrual period?"       N/A  Protocols used: LEG SWELLING AND EDEMA-A-AH

## 2018-09-14 ENCOUNTER — Ambulatory Visit: Payer: Medicare Other | Admitting: Internal Medicine

## 2018-09-14 ENCOUNTER — Other Ambulatory Visit: Payer: Self-pay

## 2018-09-14 ENCOUNTER — Ambulatory Visit (INDEPENDENT_AMBULATORY_CARE_PROVIDER_SITE_OTHER)
Admission: RE | Admit: 2018-09-14 | Discharge: 2018-09-14 | Disposition: A | Discharge status: Home / Self Care | Payer: Medicare Other | Source: Ambulatory Visit | Attending: Internal Medicine | Admitting: Internal Medicine

## 2018-09-14 ENCOUNTER — Encounter: Payer: Self-pay | Admitting: Internal Medicine

## 2018-09-14 VITALS — BP 136/88 | HR 92 | Temp 98.7°F | Ht 69.0 in | Wt 250.0 lb

## 2018-09-14 DIAGNOSIS — J019 Acute sinusitis, unspecified: Secondary | ICD-10-CM

## 2018-09-14 DIAGNOSIS — R079 Chest pain, unspecified: Secondary | ICD-10-CM

## 2018-09-14 DIAGNOSIS — J309 Allergic rhinitis, unspecified: Secondary | ICD-10-CM

## 2018-09-14 DIAGNOSIS — J449 Chronic obstructive pulmonary disease, unspecified: Secondary | ICD-10-CM

## 2018-09-14 MED ORDER — METHYLPREDNISOLONE ACETATE 80 MG/ML IJ SUSP
80.0000 mg | Freq: Once | INTRAMUSCULAR | Status: AC
  Administered 2018-09-14: 80 mg via INTRAMUSCULAR

## 2018-09-14 MED ORDER — DOXYCYCLINE HYCLATE 100 MG PO TABS: 100.0000 mg | ORAL_TABLET | Freq: Two times a day (BID) | ORAL | 0 refills | Status: DC

## 2018-09-14 NOTE — Assessment & Plan Note (Signed)
Atypical, suspect msk, but will have cxr r/o pulmonary process

## 2018-09-14 NOTE — Assessment & Plan Note (Signed)
With seasonal flare, for depomedrol IM 80,  to f/u any worsening symptoms or concerns

## 2018-09-14 NOTE — Assessment & Plan Note (Signed)
Mild to mod, for antibx course,  to f/u any worsening symptoms or concerns 

## 2018-09-14 NOTE — Assessment & Plan Note (Signed)
stable overall by history and exam, recent data reviewed with pt, and pt to continue medical treatment as before,  to f/u any worsening symptoms or concerns  

## 2018-09-14 NOTE — Patient Instructions (Addendum)
Please take all new medication as prescribed - the antibiotic  You had the steroid shot today  Please continue all other medications as before, and refills have been done if requested.  Please have the pharmacy call with any other refills you may need.  Please continue your efforts at being more active, low cholesterol diet, and weight control.  Please keep your appointments with your specialists as you may have planned  Please go to the XRAY Department in the Basement (go straight as you get off the elevator) for the x-ray testing  You will be contacted by phone if any changes need to be made immediately.  Otherwise, you will receive a letter about your results with an explanation, but please check with MyChart first.  Please remember to sign up for MyChart if you have not done so, as this will be important to you in the future with finding out test results, communicating by private email, and scheduling acute appointments online when needed.

## 2018-09-14 NOTE — Progress Notes (Signed)
Subjective:    Patient ID: Warren Weeks, male    DOB: 1945/10/20, 73 y.o.   MRN: [id number]  HPI   Here with 2-3 days acute onset fever, facial pain, pressure, headache, general weakness and malaise, and greenish d/c, with mild ST and cough, but pt denies wheezing, increased sob or doe, orthopnea, PND, increased LE swelling, palpitations, dizziness or syncope.  Does have several wks ongoing nasal allergy symptoms with clearish congestion, itch and sneezing, without fever, pain, ST, cough, swelling or wheezing.  Also c/o right mid lateral chest pain sharp worse with twisting at the waist, no clearly pleuritic, mild intermittent for at least 1 yr, concerned about possible lung ca as a friend now has this.  Last cxr mar 2019 negative Past Medical History:  Diagnosis Date  . ALLERGIC RHINITIS 09/16/2007  . ALLERGY 03/24/2007  . Allergy   . ANXIETY 03/28/2007  . Arthritis    knees, back,shoulder  . CAROTID ARTERY STENOSIS, BILATERAL 07/01/2009   yearly checks - no current problems  . CEPHALGIA 06/08/2009  . CHEST PAIN 09/16/2007   no current problems  . COLONIC POLYPS, HX OF 09/16/2007  . COPD (chronic obstructive pulmonary disease) ([place]) 05/12/2017   mild per patient - no inhaler  . DEPRESSION 03/28/2007  . Dizziness and giddiness 06/08/2009  . 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 DYSPNEA/SHORTNESS OF BREATH 09/16/2007  . ERECTILE DYSFUNCTION 03/28/2007  . ESOPHAGEAL STRICTURE 09/16/2007  . ESOPHAGITIS 03/24/2007  . GERD 03/24/2007  . GLUCOSE INTOLERANCE 09/16/2007  . HEMORRHOIDS, INTERNAL 03/24/2007  . HYPERCHOLESTEROLEMIA 03/24/2007  . HYPERLIPIDEMIA 09/16/2007  . HYPERTENSION 03/28/2007  . 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 Impaired glucose tolerance 10/04/2010  . LOW BACK PAIN 03/28/2007  . MAGNETIC RESONANCE IMAGING, BRAIN, ABNORMAL 06/26/2009  . NECK PAIN, LEFT 06/20/2010  . OBESITY 03/24/2007  . OTITIS MEDIA, LEFT 06/10/2009  . Palpitations 06/20/2010   no current problems  . PVD 06/25/2009  .  RASH-NONVESICULAR 06/17/2009  . URI 08/16/2009   Past Surgical History:  Procedure Laterality Date  . COLONOSCOPY  03/2013   polyps/[place]  . s/p right knee arthroscopy    . UPPER GASTROINTESTINAL ENDOSCOPY     gerd    reports that he has quit smoking. His smoking use included cigarettes. He has a 7.50 pack-year smoking history. He has never used smokeless tobacco. He reports current alcohol use of about 7.0 standard drinks of alcohol per week. He reports that he does not use drugs. family history includes Coronary artery disease in his brother and sister; Heart disease in his father; Lung cancer in his sister; Lymphoma in his mother; Ovarian cancer in his sister. Allergies  Allergen Reactions  . Cefuroxime Axetil Rash   Current Outpatient Medications on File Prior to Visit  Medication Sig Dispense Refill  . acetaminophen (TYLENOL) 650 MG CR tablet Take 650 mg by mouth every 8 (eight) hours as needed for pain.    Marland Kitchen aspirin (ECOTRIN LOW STRENGTH) 81 MG EC tablet Take 81 mg by mouth daily.      . Cyanocobalamin (B-12 PO) Take 1 tablet by mouth daily.    . diclofenac (VOLTAREN) 75 MG EC tablet Take 1 tablet (75 mg total) by mouth 2 (two) times daily. (Patient taking differently: Take 75 mg by mouth 2 (two) times daily as needed (for pain). ) 180 tablet 1  . ezetimibe (ZETIA) 10 MG tablet TAKE 1 TABLET BY MOUTH ONCE DAILY 90 tablet 1  . fluticasone (FLONASE) 50 MCG/ACT nasal spray USE 2  SPRAY(S) IN EACH NOSTRIL ONCE DAILY 16 g 5  . hydrochlorothiazide (HYDRODIURIL) 25 MG tablet TAKE 1 TABLET BY MOUTH ONCE DAILY 90 tablet 3  . lisinopril (PRINIVIL,ZESTRIL) 20 MG tablet Take 1 tablet (20 mg total) by mouth daily. 90 tablet 3  . Melatonin 10 MG TABS Take 10 mg by mouth at bedtime.     . metoprolol tartrate (LOPRESSOR) 50 MG tablet TAKE 1/2 (ONE-HALF) TABLET BY MOUTH TWICE DAILY 90 tablet 3  . Multiple Vitamin (ONE-A-DAY 55 PLUS PO) Take 1 tablet by mouth daily.    Marland Kitchen omeprazole (PRILOSEC) 20 MG  capsule TAKE 1 CAPSULE BY MOUTH TWICE DAILY 180 capsule 1  . rosuvastatin (CRESTOR) 40 MG tablet TAKE 1 TABLET BY MOUTH ONCE DAILY 90 tablet 2  . sildenafil (VIAGRA) 100 MG tablet Take 1 tablet (100 mg total) by mouth as needed for erectile dysfunction. 10 tablet 11  . terazosin (HYTRIN) 5 MG capsule Take 5 mg by mouth at bedtime.     . thiamine 250 MG tablet Take 250 mg by mouth daily.    Marland Kitchen triamcinolone cream (KENALOG) 0.1 % APPLY  CREAM EXTERNALLY TWICE DAILY 30 g 0  . Turmeric 500 MG TABS Take by mouth daily.     No current facility-administered medications on file prior to visit.    Review of Systems  Constitutional: Negative for other unusual diaphoresis or sweats HENT: Negative for ear discharge or swelling Eyes: Negative for other worsening visual disturbances Respiratory: Negative for stridor or other swelling  Gastrointestinal: Negative for worsening distension or other blood Genitourinary: Negative for retention or other urinary change Musculoskeletal: Negative for other MSK pain or swelling Skin: Negative for color change or other new lesions Neurological: Negative for worsening tremors and other numbness  Psychiatric/Behavioral: Negative for worsening agitation or other fatigue All other system neg per pt    Objective:   Physical Exam BP 136/88   Pulse 92   Temp 98.7 F (37.1 C) (Oral)   Ht 5\' 9"  (1.753 m)   Wt 250 lb (113.4 kg)   SpO2 94%   BMI 36.92 kg/m  VS noted, mild ill Constitutional: Pt appears in NAD HENT: Head: NCAT.  Right Ear: External ear normal.  Left Ear: External ear normal.  Bilat tm's with mild erythema.  Max sinus areas mild tender.  Pharynx with mild erythema, no exudate Eyes: . Pupils are equal, round, and reactive to light. Conjunctivae and EOM are normal Nose: without d/c or deformity Neck: Neck supple. Gross normal ROM Cardiovascular: Normal rate and regular rhythm.   Pulmonary/Chest: Effort normal and breath sounds without rales or  wheezing.  Abd:  Soft, NT, ND, + BS, no organomegaly Neurological: Pt is alert. At baseline orientation, motor grossly intact Skin: Skin is warm. No rashes, other new lesions, no LE edema Psychiatric: Pt behavior is normal without agitation  No other exam findings Lab Results  Component Value Date   WBC 5.3 12/01/2017   HGB 13.7 12/01/2017   HCT 40.7 12/01/2017   PLT 155.0 12/01/2017   GLUCOSE 117 (H) 12/01/2017   CHOL 160 12/01/2017   TRIG 144.0 12/01/2017   HDL 50.30 12/01/2017   LDLDIRECT 176.0 04/03/2011   LDLCALC 81 12/01/2017   ALT 46 12/01/2017   AST 29 12/01/2017   NA 142 12/01/2017   K 4.2 12/01/2017   CL 105 12/01/2017   CREATININE 1.11 12/01/2017   BUN 17 12/01/2017   CO2 30 12/01/2017   TSH 2.29 12/01/2017   PSA  0.13 12/01/2017   HGBA1C 6.0 (A) 06/03/2018       Assessment & Plan:

## 2018-10-03 ENCOUNTER — Other Ambulatory Visit: Payer: Self-pay

## 2018-10-03 ENCOUNTER — Emergency Department (HOSPITAL_COMMUNITY)
Admission: EM | Admit: 2018-10-03 | Discharge: 2018-10-03 | Disposition: A | Discharge status: Home / Self Care | Payer: Medicare Other | Attending: Emergency Medicine | Admitting: Emergency Medicine

## 2018-10-03 ENCOUNTER — Telehealth: Payer: Self-pay | Admitting: Internal Medicine

## 2018-10-03 ENCOUNTER — Emergency Department (HOSPITAL_COMMUNITY): Payer: Medicare Other

## 2018-10-03 DIAGNOSIS — I10 Essential (primary) hypertension: Secondary | ICD-10-CM | POA: Diagnosis not present

## 2018-10-03 DIAGNOSIS — R05 Cough: Secondary | ICD-10-CM | POA: Diagnosis not present

## 2018-10-03 DIAGNOSIS — Z7982 Long term (current) use of aspirin: Secondary | ICD-10-CM | POA: Insufficient documentation

## 2018-10-03 DIAGNOSIS — Z79899 Other long term (current) drug therapy: Secondary | ICD-10-CM | POA: Diagnosis not present

## 2018-10-03 DIAGNOSIS — J449 Chronic obstructive pulmonary disease, unspecified: Secondary | ICD-10-CM | POA: Diagnosis not present

## 2018-10-03 DIAGNOSIS — J029 Acute pharyngitis, unspecified: Secondary | ICD-10-CM | POA: Diagnosis not present

## 2018-10-03 DIAGNOSIS — R059 Cough, unspecified: Secondary | ICD-10-CM

## 2018-10-03 DIAGNOSIS — Z87891 Personal history of nicotine dependence: Secondary | ICD-10-CM | POA: Insufficient documentation

## 2018-10-03 NOTE — Discharge Instructions (Addendum)
Your chest x-ray today is normal.  Suspect you may have allergies or possibly a post viral cough.  No infectious process was found on chest x-ray.

## 2018-10-03 NOTE — ED Triage Notes (Signed)
Patient reports lingering cough - states he was treated with antibiotic end of March and cough progressed since Thursday - productive now (yellow/green mucus), no relief with Delsym. Endorses increasing hoarseness as well. Denies fevers/chills. Resp e/u.

## 2018-10-03 NOTE — ED Provider Notes (Signed)
[facility] EMERGENCY DEPARTMENT Provider Note   CSN: [id number] Arrival date & time: 10/03/18  1447    History   Chief Complaint Chief Complaint  Patient presents with   Cough    HPI Warren Weeks is a 73 y.o. male.     The history is provided by the patient.  Cough  Cough characteristics:  Non-productive Sputum characteristics:  Nondescript Severity:  Mild Onset quality:  Gradual Timing:  Intermittent Progression:  Waxing and waning Smoker: no   Context: upper respiratory infection (started multiple weeks ago, still with cough) and weather changes (possible allergies)   Relieved by:  Nothing Worsened by:  Nothing Associated symptoms: sore throat (dry throat)   Associated symptoms: no chest pain, no chills, no diaphoresis, no ear fullness, no ear pain, no eye discharge, no fever, no headaches, no myalgias, no rash, no rhinorrhea, no shortness of breath, no sinus congestion, no weight loss and no wheezing     Past Medical History:  Diagnosis Date   ALLERGIC RHINITIS 09/16/2007   ALLERGY 03/24/2007   Allergy    ANXIETY 03/28/2007   Arthritis    knees, back,shoulder   CAROTID ARTERY STENOSIS, BILATERAL 07/01/2009   yearly checks - no current problems   CEPHALGIA 06/08/2009   CHEST PAIN 09/16/2007   no current problems   COLONIC POLYPS, HX OF 09/16/2007   COPD (chronic obstructive pulmonary disease) ([place]) 05/12/2017   mild per patient - no inhaler   DEPRESSION 03/28/2007   Dizziness and giddiness 06/08/2009   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   DYSPNEA/SHORTNESS OF BREATH 09/16/2007   ERECTILE DYSFUNCTION 03/28/2007   ESOPHAGEAL STRICTURE 09/16/2007   ESOPHAGITIS 03/24/2007   GERD 03/24/2007   GLUCOSE INTOLERANCE 09/16/2007   HEMORRHOIDS, INTERNAL 03/24/2007   HYPERCHOLESTEROLEMIA 03/24/2007   HYPERLIPIDEMIA 09/16/2007   HYPERTENSION 03/28/2007   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/2011   Impaired glucose tolerance 10/04/2010   LOW BACK PAIN  03/28/2007   MAGNETIC RESONANCE IMAGING, BRAIN, ABNORMAL 06/26/2009   NECK PAIN, LEFT 06/20/2010   OBESITY 03/24/2007   OTITIS MEDIA, LEFT 06/10/2009   Palpitations 06/20/2010   no current problems   PVD 06/25/2009   RASH-NONVESICULAR 06/17/2009   URI 08/16/2009    Patient Active Problem List   Diagnosis Date Noted   Acute sinus infection 09/14/2018   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   COPD (chronic obstructive pulmonary disease) ([place]) 05/12/2017   Pleurisy 03/16/2017   Bilateral leg pain 11/22/2016   Insomnia 10/13/2016   Cough 07/22/2016   Wheezing 07/22/2016   Peripheral edema 02/21/2016   Venous insufficiency 02/05/2016   Low back pain radiating to lower extremity 02/05/2016   Vertigo    Hypersomnolence 10/24/2015   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   Chest pain 01/18/2013   Hematochezia 10/12/2012   Viral illness 05/26/2012   Palpitations 01/05/2012   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/2011   Fatigue 04/09/2011   Rash 12/22/2010   Encounter for long-term (current) use of high-risk medication 10/08/2010   Impaired glucose tolerance 10/04/2010   Encounter for well adult exam with abnormal findings 10/04/2010   Carotid artery stenosis 07/01/2009   PVD 06/25/2009   Allergic rhinitis 09/16/2007   ESOPHAGEAL STRICTURE 09/16/2007   COLONIC POLYPS, HX OF 09/16/2007   ANXIETY 03/28/2007   ERECTILE DYSFUNCTION 03/28/2007   Hyperlipidemia 03/28/2007   Essential hypertension 03/28/2007   LOW BACK PAIN 03/28/2007   OBESITY 03/24/2007   HEMORRHOIDS, INTERNAL 03/24/2007   ESOPHAGITIS 03/24/2007   GERD 03/24/2007    Past Surgical History:  Procedure Laterality Date  COLONOSCOPY  03/2013   polyps/[place]   s/p right knee arthroscopy     UPPER GASTROINTESTINAL ENDOSCOPY     gerd        Home Medications    Prior to Admission medications   Medication Sig Start Date End Date Taking? Authorizing Provider  acetaminophen (TYLENOL) 650 MG  CR tablet Take 650 mg by mouth every 8 (eight) hours as needed for pain.    [provider]  aspirin (ECOTRIN LOW STRENGTH) 81 MG EC tablet Take 81 mg by mouth daily.      [provider]  Cyanocobalamin (B-12 PO) Take 1 tablet by mouth daily.    [provider]  diclofenac (VOLTAREN) 75 MG EC tablet Take 1 tablet (75 mg total) by mouth 2 (two) times daily. Patient taking differently: Take 75 mg by mouth 2 (two) times daily as needed (for pain).  05/12/17   Biagio Borg, MD  doxycycline (VIBRA-TABS) 100 MG tablet Take 1 tablet (100 mg total) by mouth 2 (two) times daily. 09/14/18   Biagio Borg, MD  ezetimibe (ZETIA) 10 MG tablet TAKE 1 TABLET BY MOUTH ONCE DAILY 06/24/18   Biagio Borg, MD  fluticasone [facility]) 50 MCG/ACT nasal spray USE 2 SPRAY(S) IN [facility] NOSTRIL ONCE DAILY 02/22/18   Biagio Borg, MD  hydrochlorothiazide (HYDRODIURIL) 25 MG tablet TAKE 1 TABLET BY MOUTH ONCE DAILY 02/22/18   Biagio Borg, MD  lisinopril (PRINIVIL,ZESTRIL) 20 MG tablet Take 1 tablet (20 mg total) by mouth daily. 12/01/17   Biagio Borg, MD  Melatonin 10 MG TABS Take 10 mg by mouth at bedtime.     [provider]  metoprolol tartrate (LOPRESSOR) 50 MG tablet TAKE 1/2 (ONE-HALF) TABLET BY MOUTH TWICE DAILY 02/22/18   Biagio Borg, MD  Multiple Vitamin (ONE-A-DAY 55 PLUS PO) Take 1 tablet by mouth daily.    [provider]  omeprazole (PRILOSEC) 20 MG capsule TAKE 1 CAPSULE BY MOUTH TWICE DAILY 04/18/18   Biagio Borg, MD  rosuvastatin (CRESTOR) 40 MG tablet TAKE 1 TABLET BY MOUTH ONCE DAILY 01/18/18   Biagio Borg, MD  sildenafil (VIAGRA) 100 MG tablet Take 1 tablet (100 mg total) by mouth as needed for erectile dysfunction. 12/20/12   Biagio Borg, MD  terazosin (HYTRIN) 5 MG capsule Take 5 mg by mouth at bedtime.  03/22/15   [provider]  thiamine 250 MG tablet Take 250 mg by mouth daily.    [provider]  triamcinolone cream (KENALOG) 0.1 % APPLY   CREAM EXTERNALLY TWICE DAILY 09/07/18   Biagio Borg, MD  Turmeric 500 MG TABS Take by mouth daily.    [provider]    Family History Family History  Problem Relation Age of Onset   Lymphoma Mother    Heart disease Father        CHF   Ovarian cancer Sister    Lung cancer Sister    Coronary artery disease Sister        CABG   Coronary artery disease Brother        stent, and CAD   Colon cancer Neg Hx    Esophageal cancer Neg Hx    Rectal cancer Neg Hx    Stomach cancer Neg Hx    Colon polyps Neg Hx     Social History Social History   Tobacco Use   Smoking status: Former Smoker    Packs/day: 0.50  Years: 15.00    Pack years: 7.50    Types: Cigarettes   Smokeless tobacco: Never Used   Tobacco comment: Quit 30 years ago  Substance Use Topics   Alcohol use: Yes    Alcohol/week: 7.0 standard drinks    Types: 7 Cans of beer per week    Comment: beer most days   Drug use: No     Allergies   Cefuroxime axetil   Review of Systems Review of Systems  Constitutional: Negative for chills, diaphoresis, fever and weight loss.  HENT: Positive for sore throat (dry throat). Negative for ear pain and rhinorrhea.   Eyes: Negative for pain, discharge and visual disturbance.  Respiratory: Positive for cough. Negative for shortness of breath and wheezing.   Cardiovascular: Negative for chest pain and palpitations.  Gastrointestinal: Negative for abdominal pain and vomiting.  Genitourinary: Negative for dysuria and hematuria.  Musculoskeletal: Negative for arthralgias, back pain and myalgias.  Skin: Negative for color change and rash.  Neurological: Negative for seizures, syncope and headaches.  All other systems reviewed and are negative.    Physical Exam Updated Vital Signs  ED Triage Vitals  Enc Vitals Group     BP 10/03/18 1451 139/68     Pulse Rate 10/03/18 1451 92     Resp 10/03/18 1451 16     Temp 10/03/18 1451 98 F (36.7 C)      Temp Source 10/03/18 1451 Oral     SpO2 10/03/18 1451 98 %     Weight --      Height --      Head Circumference --      Peak Flow --      Pain Score 10/03/18 1456 0     Pain Loc --      Pain Edu? --      Excl. in [place]? --     Physical Exam Vitals signs and nursing note reviewed.  Constitutional:      Appearance: He is well-developed.  HENT:     Head: Normocephalic and atraumatic.     Nose: Nose normal.     Mouth/Throat:     Mouth: Mucous membranes are moist.     Pharynx: No oropharyngeal exudate.  Eyes:     Extraocular Movements: Extraocular movements intact.     Conjunctiva/sclera: Conjunctivae normal.     Pupils: Pupils are equal, round, and reactive to light.  Neck:     Musculoskeletal: Normal range of motion and neck supple.  Cardiovascular:     Rate and Rhythm: Normal rate and regular rhythm.     Pulses: Normal pulses.     Heart sounds: Normal heart sounds. No murmur.  Pulmonary:     Effort: Pulmonary effort is normal. No respiratory distress.     Breath sounds: Normal breath sounds.  Abdominal:     General: There is no distension.     Palpations: Abdomen is soft.     Tenderness: There is no abdominal tenderness.  Skin:    General: Skin is warm and dry.     Capillary Refill: Capillary refill takes less than 2 seconds.  Neurological:     General: No focal deficit present.     Mental Status: He is alert.  Psychiatric:        Mood and Affect: Mood normal.      ED Treatments / Results  Labs (all labs ordered are listed, but only abnormal results are displayed) Labs Reviewed - No data to display  EKG None  Radiology Dg Chest 2 View  Result Date: 10/03/2018 CLINICAL DATA:  73 year old male with a history of cough EXAM: CHEST - 2 VIEW COMPARISON:  09/14/2018, 08/24/2017 FINDINGS: Cardiomediastinal silhouette unchanged in size and contour. No evidence of central vascular congestion. No pleural effusion or pneumothorax. No confluent airspace disease. Similar  appearance of coarsened interstitial markings. Degenerative changes of the spine.  No displaced fracture IMPRESSION: Chronic lung changes without evidence of acute cardiopulmonary disease Electronically Signed   By: Corrie Mckusick D.O.   On: 10/03/2018 15:52    Procedures Procedures (including critical care time)  Medications Ordered in ED Medications - No data to display   Initial Impression / Assessment and Plan / ED Course  I have reviewed the triage vital signs and the nursing notes.  Pertinent labs & imaging results that were available during my care of the patient were reviewed by me and considered in my medical decision making (see chart for details).     Warren Weeks is a 73 year old male with history of palpitations, COPD who presents to the ED with cough.  Patient with normal vitals.  No fever.  Patient with clear breath sounds.  No chest pain.  No fever.  States that he had some viral symptoms about a month ago.  Continues to have a cough.  Has some dry throat.  Concerned about possible allergies.  States that his cough has had some sputum production.  Chest x-ray showed no signs of pneumonia, pneumothorax, pleural effusion.  Patient is overall extremely well-appearing.  No wheezing on exam.  No respiratory distress.  Suspect symptoms likely secondary to allergies.  Given reassurance and discharged from ED in good condition.  Recommend follow-up with primary care doctor.  This chart was dictated using voice recognition software.  Despite best efforts to proofread,  errors can occur which can change the documentation meaning.    Final Clinical Impressions(s) / ED Diagnoses   Final diagnoses:  Cough    ED Discharge Orders    None       Lennice Sites, DO 10/03/18 1602

## 2018-10-03 NOTE — ED Notes (Signed)
Pt reports having the opportunity to have his questions answered, pt verbalizews understanding of discharge instructions

## 2018-10-03 NOTE — Telephone Encounter (Signed)
Sounds like probable viral illness - Ok for mucinex otc prn, rest, fluids, tylenol or advil, and consider going to [facility] ED for any worsening fever, cough and sob, especially if has recently traveled or has known exposure to [phone]

## 2018-10-03 NOTE — Telephone Encounter (Signed)
Pt has been informed and expressed understanding. He stated that he has been trying OTC remedies and they are successful. He stated that he would be seen at the ED if symptoms get worse.

## 2018-10-03 NOTE — Telephone Encounter (Signed)
Pt called for an appointment , has a cough with grey phlegm, patient is hoarse and cannot talk loud. Patient  is unable to do a virtual visit please advise on what patient can do.

## 2018-10-05 ENCOUNTER — Ambulatory Visit (INDEPENDENT_AMBULATORY_CARE_PROVIDER_SITE_OTHER): Payer: Medicare Other | Admitting: Internal Medicine

## 2018-10-05 ENCOUNTER — Encounter: Payer: Self-pay | Admitting: Internal Medicine

## 2018-10-05 DIAGNOSIS — R7302 Impaired glucose tolerance (oral): Secondary | ICD-10-CM | POA: Diagnosis not present

## 2018-10-05 DIAGNOSIS — J329 Chronic sinusitis, unspecified: Secondary | ICD-10-CM

## 2018-10-05 DIAGNOSIS — J019 Acute sinusitis, unspecified: Secondary | ICD-10-CM | POA: Diagnosis not present

## 2018-10-05 DIAGNOSIS — J309 Allergic rhinitis, unspecified: Secondary | ICD-10-CM

## 2018-10-05 MED ORDER — PREDNISONE 10 MG PO TABS: ORAL_TABLET | ORAL | 0 refills | Status: DC

## 2018-10-05 MED ORDER — AZITHROMYCIN 250 MG PO TABS: ORAL_TABLET | ORAL | 1 refills | Status: DC

## 2018-10-05 NOTE — Assessment & Plan Note (Signed)
Mild to mod, for antibx course,  to f/u any worsening symptoms or concerns 

## 2018-10-05 NOTE — Progress Notes (Signed)
Patient ID: Warren Weeks, male   DOB: 05/31/1946, 73 y.o.   MRN: [id number]  Virtual Visit via Video Note  I connected with Warren Weeks on 10/05/18 at  1:40 PM EDT by a video enabled telemedicine application and verified that I am speaking with the correct person using two identifiers. Pt is at home, I am in office, no other persons present    I discussed the limitations of evaluation and management by telemedicine and the availability of in person appointments. The patient expressed understanding and agreed to proceed.  History of Present Illness:  Here with 2 days acute onset fever, facial pain, pressure, headache, general weakness and malaise, and greenish d/c, with mild ST and cough, but pt denies chest pain, wheezing, increased sob or doe, orthopnea, PND, increased LE swelling, palpitations, dizziness or syncope. Was seen in ED 2 days ago, afeb, vss, cxr neg for acute., but symptoms worse since then.  Does have several wks ongoing nasal allergy symptoms with clearish congestion, itch and sneezing, without fever, pain, ST, cough, swelling or wheezing.  Has known sinus polyps/chronic sinusitis and advised for surgury, but has not been able to have done due to pandemic elective surgury Past Medical History:  Diagnosis Date  . ALLERGIC RHINITIS 09/16/2007  . ALLERGY 03/24/2007  . Allergy   . ANXIETY 03/28/2007  . Arthritis    knees, back,shoulder  . CAROTID ARTERY STENOSIS, BILATERAL 07/01/2009   yearly checks - no current problems  . CEPHALGIA 06/08/2009  . CHEST PAIN 09/16/2007   no current problems  . COLONIC POLYPS, HX OF 09/16/2007  . COPD (chronic obstructive pulmonary disease) ([place]) 05/12/2017   mild per patient - no inhaler  . DEPRESSION 03/28/2007  . Dizziness and giddiness 06/08/2009  . 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 DYSPNEA/SHORTNESS OF BREATH 09/16/2007  . ERECTILE DYSFUNCTION 03/28/2007  . ESOPHAGEAL STRICTURE 09/16/2007  . ESOPHAGITIS 03/24/2007  . GERD 03/24/2007  . GLUCOSE  INTOLERANCE 09/16/2007  . HEMORRHOIDS, INTERNAL 03/24/2007  . HYPERCHOLESTEROLEMIA 03/24/2007  . HYPERLIPIDEMIA 09/16/2007  . HYPERTENSION 03/28/2007  . 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 Impaired glucose tolerance 10/04/2010  . LOW BACK PAIN 03/28/2007  . MAGNETIC RESONANCE IMAGING, BRAIN, ABNORMAL 06/26/2009  . NECK PAIN, LEFT 06/20/2010  . OBESITY 03/24/2007  . OTITIS MEDIA, LEFT 06/10/2009  . Palpitations 06/20/2010   no current problems  . PVD 06/25/2009  . RASH-NONVESICULAR 06/17/2009  . URI 08/16/2009   Past Surgical History:  Procedure Laterality Date  . COLONOSCOPY  03/2013   polyps/[place]  . s/p right knee arthroscopy    . UPPER GASTROINTESTINAL ENDOSCOPY     gerd    reports that he has quit smoking. His smoking use included cigarettes. He has a 7.50 pack-year smoking history. He has never used smokeless tobacco. He reports current alcohol use of about 7.0 standard drinks of alcohol per week. He reports that he does not use drugs. family history includes Coronary artery disease in his brother and sister; Heart disease in his father; Lung cancer in his sister; Lymphoma in his mother; Ovarian cancer in his sister. Allergies  Allergen Reactions  . Cefuroxime Axetil Rash   Current Outpatient Medications on File Prior to Visit  Medication Sig Dispense Refill  . acetaminophen (TYLENOL) 650 MG CR tablet Take 650 mg by mouth every 8 (eight) hours as needed for pain.    Marland Kitchen aspirin (ECOTRIN LOW STRENGTH) 81 MG EC tablet Take 81 mg by mouth daily.      . Cyanocobalamin (B-12  PO) Take 1 tablet by mouth daily.    . diclofenac (VOLTAREN) 75 MG EC tablet Take 1 tablet (75 mg total) by mouth 2 (two) times daily. (Patient taking differently: Take 75 mg by mouth 2 (two) times daily as needed (for pain). ) 180 tablet 1  . doxycycline (VIBRA-TABS) 100 MG tablet Take 1 tablet (100 mg total) by mouth 2 (two) times daily. 20 tablet 0  . ezetimibe (ZETIA) 10 MG tablet TAKE 1 TABLET BY MOUTH ONCE DAILY 90  tablet 1  . fluticasone (FLONASE) 50 MCG/ACT nasal spray USE 2 SPRAY(S) IN EACH NOSTRIL ONCE DAILY 16 g 5  . hydrochlorothiazide (HYDRODIURIL) 25 MG tablet TAKE 1 TABLET BY MOUTH ONCE DAILY 90 tablet 3  . lisinopril (PRINIVIL,ZESTRIL) 20 MG tablet Take 1 tablet (20 mg total) by mouth daily. 90 tablet 3  . Melatonin 10 MG TABS Take 10 mg by mouth at bedtime.     . metoprolol tartrate (LOPRESSOR) 50 MG tablet TAKE 1/2 (ONE-HALF) TABLET BY MOUTH TWICE DAILY 90 tablet 3  . Multiple Vitamin (ONE-A-DAY 55 PLUS PO) Take 1 tablet by mouth daily.    Marland Kitchen omeprazole (PRILOSEC) 20 MG capsule TAKE 1 CAPSULE BY MOUTH TWICE DAILY 180 capsule 1  . rosuvastatin (CRESTOR) 40 MG tablet TAKE 1 TABLET BY MOUTH ONCE DAILY 90 tablet 2  . sildenafil (VIAGRA) 100 MG tablet Take 1 tablet (100 mg total) by mouth as needed for erectile dysfunction. 10 tablet 11  . terazosin (HYTRIN) 5 MG capsule Take 5 mg by mouth at bedtime.     . thiamine 250 MG tablet Take 250 mg by mouth daily.    Marland Kitchen triamcinolone cream (KENALOG) 0.1 % APPLY  CREAM EXTERNALLY TWICE DAILY 30 g 0  . Turmeric 500 MG TABS Take by mouth daily.     No current facility-administered medications on file prior to visit.     Observations/Objective: Alert, mentating well, mild ill appearing, obese, slight maxillary facial swelling, cn 2-12 intact, resps normal, moves all 4s, and pt reports tender bilat maxillary and bilat submandibular areas Lab Results  Component Value Date   WBC 5.3 12/01/2017   HGB 13.7 12/01/2017   HCT 40.7 12/01/2017   PLT 155.0 12/01/2017   GLUCOSE 117 (H) 12/01/2017   CHOL 160 12/01/2017   TRIG 144.0 12/01/2017   HDL 50.30 12/01/2017   LDLDIRECT 176.0 04/03/2011   LDLCALC 81 12/01/2017   ALT 46 12/01/2017   AST 29 12/01/2017   NA 142 12/01/2017   K 4.2 12/01/2017   CL 105 12/01/2017   CREATININE 1.11 12/01/2017   BUN 17 12/01/2017   CO2 30 12/01/2017   TSH 2.29 12/01/2017   PSA 0.13 12/01/2017   HGBA1C 6.0 (A) 06/03/2018    Assessment and Plan: See notes  Follow Up Instructions: See notes   I discussed the assessment and treatment plan with the patient. The patient was provided an opportunity to ask questions and all were answered. The patient agreed with the plan and demonstrated an understanding of the instructions.   The patient was advised to call back or seek an in-person evaluation if the symptoms worsen or if the condition fails to improve as anticipated.   Cathlean Cower, MD

## 2018-10-05 NOTE — Assessment & Plan Note (Signed)
Ok to restart flonase asd,  to f/u any worsening symptoms or concerns

## 2018-10-05 NOTE — Patient Instructions (Signed)
Please take all new medication as prescribed - the antibiotic, prednisone, and OTC mucinex DM  Please continue all other medications as before, including restart the flonase  Please have the pharmacy call with any other refills you may need.  Please continue your efforts at being more active, low cholesterol diet, and weight control.  Please keep your appointments with your specialists as you may have planned

## 2018-10-05 NOTE — Assessment & Plan Note (Signed)
Also for predpac asd, mucinex DM, and f/u ent as planned

## 2018-10-05 NOTE — Assessment & Plan Note (Signed)
stable overall by history and exam, recent data reviewed with pt, and pt to continue medical treatment as before,  to f/u any worsening symptoms or concerns  

## 2018-10-25 ENCOUNTER — Other Ambulatory Visit: Payer: Self-pay | Admitting: Internal Medicine

## 2018-12-07 ENCOUNTER — Ambulatory Visit (INDEPENDENT_AMBULATORY_CARE_PROVIDER_SITE_OTHER): Payer: Medicare Other | Admitting: Internal Medicine

## 2018-12-07 ENCOUNTER — Other Ambulatory Visit: Payer: Self-pay

## 2018-12-07 ENCOUNTER — Encounter: Payer: Self-pay | Admitting: Internal Medicine

## 2018-12-07 ENCOUNTER — Other Ambulatory Visit (INDEPENDENT_AMBULATORY_CARE_PROVIDER_SITE_OTHER): Payer: Medicare Other

## 2018-12-07 VITALS — BP 118/68 | HR 77 | Temp 98.1°F | Ht 69.0 in | Wt 249.0 lb

## 2018-12-07 DIAGNOSIS — R7302 Impaired glucose tolerance (oral): Secondary | ICD-10-CM

## 2018-12-07 DIAGNOSIS — M25562 Pain in left knee: Secondary | ICD-10-CM | POA: Insufficient documentation

## 2018-12-07 DIAGNOSIS — E611 Iron deficiency: Secondary | ICD-10-CM

## 2018-12-07 DIAGNOSIS — J329 Chronic sinusitis, unspecified: Secondary | ICD-10-CM

## 2018-12-07 DIAGNOSIS — E559 Vitamin D deficiency, unspecified: Secondary | ICD-10-CM

## 2018-12-07 DIAGNOSIS — Z0001 Encounter for general adult medical examination with abnormal findings: Secondary | ICD-10-CM

## 2018-12-07 DIAGNOSIS — R21 Rash and other nonspecific skin eruption: Secondary | ICD-10-CM | POA: Diagnosis not present

## 2018-12-07 DIAGNOSIS — R32 Unspecified urinary incontinence: Secondary | ICD-10-CM

## 2018-12-07 DIAGNOSIS — M545 Low back pain: Secondary | ICD-10-CM

## 2018-12-07 DIAGNOSIS — E538 Deficiency of other specified B group vitamins: Secondary | ICD-10-CM | POA: Diagnosis not present

## 2018-12-07 DIAGNOSIS — G8929 Other chronic pain: Secondary | ICD-10-CM

## 2018-12-07 DIAGNOSIS — R600 Localized edema: Secondary | ICD-10-CM

## 2018-12-07 DIAGNOSIS — R609 Edema, unspecified: Secondary | ICD-10-CM

## 2018-12-07 DIAGNOSIS — R490 Dysphonia: Secondary | ICD-10-CM | POA: Insufficient documentation

## 2018-12-07 DIAGNOSIS — Z23 Encounter for immunization: Secondary | ICD-10-CM | POA: Diagnosis not present

## 2018-12-07 LAB — URINALYSIS, ROUTINE W REFLEX MICROSCOPIC
Bilirubin Urine: NEGATIVE
Hgb urine dipstick: NEGATIVE
Ketones, ur: NEGATIVE
Leukocytes,Ua: NEGATIVE
Nitrite: NEGATIVE
RBC / HPF: NONE SEEN (ref 0–?)
Specific Gravity, Urine: 1.02 (ref 1.000–1.030)
Total Protein, Urine: NEGATIVE
Urine Glucose: NEGATIVE
Urobilinogen, UA: 0.2 (ref 0.0–1.0)
pH: 6 (ref 5.0–8.0)

## 2018-12-07 LAB — CBC WITH DIFFERENTIAL/PLATELET
Basophils Absolute: 0 10*3/uL (ref 0.0–0.1)
Basophils Relative: 0.8 % (ref 0.0–3.0)
Eosinophils Absolute: 0.1 10*3/uL (ref 0.0–0.7)
Eosinophils Relative: 1.2 % (ref 0.0–5.0)
HCT: 40.3 % (ref 39.0–52.0)
Hemoglobin: 13.4 g/dL (ref 13.0–17.0)
Lymphocytes Relative: 25.5 % (ref 12.0–46.0)
Lymphs Abs: 1.4 10*3/uL (ref 0.7–4.0)
MCHC: 33.2 g/dL (ref 30.0–36.0)
MCV: 91.8 fl (ref 78.0–100.0)
Monocytes Absolute: 0.5 10*3/uL (ref 0.1–1.0)
Monocytes Relative: 8.8 % (ref 3.0–12.0)
Neutro Abs: 3.4 10*3/uL (ref 1.4–7.7)
Neutrophils Relative %: 63.7 % (ref 43.0–77.0)
Platelets: 156 10*3/uL (ref 150.0–400.0)
RBC: 4.4 Mil/uL (ref 4.22–5.81)
RDW: 14.9 % (ref 11.5–15.5)
WBC: 5.4 10*3/uL (ref 4.0–10.5)

## 2018-12-07 LAB — LIPID PANEL
Cholesterol: 170 mg/dL (ref 0–200)
HDL: 64.4 mg/dL (ref >39.00)
LDL Cholesterol: 86 mg/dL (ref 0–99)
NonHDL: 105.73
Total CHOL/HDL Ratio: 3
Triglycerides: 100 mg/dL (ref 0.0–149.0)
VLDL: 20 mg/dL (ref 0.0–40.0)

## 2018-12-07 LAB — BASIC METABOLIC PANEL
BUN: 16 mg/dL (ref 6–23)
CO2: 29 mEq/L (ref 19–32)
Calcium: 9.4 mg/dL (ref 8.4–10.5)
Chloride: 104 mEq/L (ref 96–112)
Creatinine, Ser: 0.99 mg/dL (ref 0.40–1.50)
GFR: 90.29 mL/min (ref >60.00)
Glucose, Bld: 109 mg/dL — ABNORMAL HIGH (ref 70–99)
Potassium: 4.8 mEq/L (ref 3.5–5.1)
Sodium: 140 mEq/L (ref 135–145)

## 2018-12-07 LAB — HEPATIC FUNCTION PANEL
ALT: 55 U/L — ABNORMAL HIGH (ref 0–53)
AST: 37 U/L (ref 0–37)
Albumin: 4.2 g/dL (ref 3.5–5.2)
Alkaline Phosphatase: 83 U/L (ref 39–117)
Bilirubin, Direct: 0.1 mg/dL (ref 0.0–0.3)
Total Bilirubin: 0.5 mg/dL (ref 0.2–1.2)
Total Protein: 6.7 g/dL (ref 6.0–8.3)

## 2018-12-07 LAB — TSH: TSH: 2.46 u[IU]/mL (ref 0.35–4.50)

## 2018-12-07 LAB — IBC PANEL
Iron: 93 ug/dL (ref 42–165)
Saturation Ratios: 31.5 % (ref 20.0–50.0)
Transferrin: 211 mg/dL — ABNORMAL LOW (ref 212.0–360.0)

## 2018-12-07 LAB — HEMOGLOBIN A1C: Hgb A1c MFr Bld: 6.7 % — ABNORMAL HIGH (ref 4.6–6.5)

## 2018-12-07 LAB — VITAMIN B12: Vitamin B-12: 880 pg/mL (ref 211–911)

## 2018-12-07 LAB — BRAIN NATRIURETIC PEPTIDE: Pro B Natriuretic peptide (BNP): 35 pg/mL (ref 0.0–100.0)

## 2018-12-07 LAB — VITAMIN D 25 HYDROXY (VIT D DEFICIENCY, FRACTURES): VITD: 34.55 ng/mL (ref 30.00–100.00)

## 2018-12-07 LAB — PSA: PSA: 0.11 ng/mL (ref 0.10–4.00)

## 2018-12-07 MED ORDER — EZETIMIBE 10 MG PO TABS: 10.0000 mg | ORAL_TABLET | Freq: Every day | ORAL | 1 refills | Status: DC

## 2018-12-07 MED ORDER — DICLOFENAC SODIUM 75 MG PO TBEC: 75.0000 mg | DELAYED_RELEASE_TABLET | Freq: Two times a day (BID) | ORAL | 1 refills | Status: DC | PRN

## 2018-12-07 MED ORDER — TRIAMCINOLONE ACETONIDE 0.1 % EX CREA: TOPICAL_CREAM | CUTANEOUS | 1 refills | Status: DC

## 2018-12-07 MED ORDER — FLUTICASONE PROPIONATE 50 MCG/ACT NA SUSP: NASAL | 5 refills | Status: DC

## 2018-12-07 NOTE — Progress Notes (Signed)
Subjective:    Patient ID: Warren Weeks, male    DOB: 1946/01/16, 73 y.o.   MRN: [id number]  HPI  Here for wellness and f/u;  Overall doing ok; Pt denies neurological change such as new headache, facial or extremity weakness.  Pt denies polydipsia, polyuria, or low sugar symptoms. Pt states overall good compliance with treatment and medications, good tolerability, and has been trying to follow appropriate diet.  Pt denies worsening depressive symptoms, suicidal ideation or panic. No fever, night sweats, wt loss, loss of appetite, or other constitutional symptoms.  Pt states good ability with ADL's, has low fall risk, home safety reviewed and adequate, no other significant changes in hearing or vision, and not active with exercise. Wt Readings from Last 3 Encounters:  12/07/18 249 lb (112.9 kg)  09/14/18 250 lb (113.4 kg)  08/12/18 250 lb (113.4 kg)  Also, has persistent bilateral LE edema, though Pt denies chest pain, increased sob or doe, wheezing, orthopnea, PND, increased LE swelling, palpitations, dizziness or syncope.   Denies urinary symptoms such as dysuria, frequency, urgency, flank pain, hematuria or n/v, fever, chills, though has worsening urinary leakage and nocturia every 2 hrs despite the terazosin.   Pt continues to have recurring LBP without change in severity, bowel or bladder change, fever, wt loss,  worsening LE pain/numbness/weakness, gait change or falls. Also has left knee pain and swelling and several giveaways but no actual falls, without trauma, mild for several months recurrent intermittent.  Also with bilat hand eczema with dryness itching and cracking of the skin for several weeks Also with c/o hoarseness with talking after about 10 min recurrently for unclear reasons, cant sing at all at church even to start, has chronic sinusitis with polyps and plans to f/u with ENT Dr Benjamine Mola Past Medical History:  Diagnosis Date  . ALLERGIC RHINITIS 09/16/2007  . ALLERGY 03/24/2007  .  Allergy   . ANXIETY 03/28/2007  . Arthritis    knees, back,shoulder  . CAROTID ARTERY STENOSIS, BILATERAL 07/01/2009   yearly checks - no current problems  . CEPHALGIA 06/08/2009  . CHEST PAIN 09/16/2007   no current problems  . COLONIC POLYPS, HX OF 09/16/2007  . COPD (chronic obstructive pulmonary disease) ([place]) 05/12/2017   mild per patient - no inhaler  . DEPRESSION 03/28/2007  . Dizziness and giddiness 06/08/2009  . 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 DYSPNEA/SHORTNESS OF BREATH 09/16/2007  . ERECTILE DYSFUNCTION 03/28/2007  . ESOPHAGEAL STRICTURE 09/16/2007  . ESOPHAGITIS 03/24/2007  . GERD 03/24/2007  . GLUCOSE INTOLERANCE 09/16/2007  . HEMORRHOIDS, INTERNAL 03/24/2007  . HYPERCHOLESTEROLEMIA 03/24/2007  . HYPERLIPIDEMIA 09/16/2007  . HYPERTENSION 03/28/2007  . 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 Impaired glucose tolerance 10/04/2010  . LOW BACK PAIN 03/28/2007  . MAGNETIC RESONANCE IMAGING, BRAIN, ABNORMAL 06/26/2009  . NECK PAIN, LEFT 06/20/2010  . OBESITY 03/24/2007  . OTITIS MEDIA, LEFT 06/10/2009  . Palpitations 06/20/2010   no current problems  . PVD 06/25/2009  . RASH-NONVESICULAR 06/17/2009  . URI 08/16/2009   Past Surgical History:  Procedure Laterality Date  . COLONOSCOPY  03/2013   polyps/[place]  . s/p right knee arthroscopy    . UPPER GASTROINTESTINAL ENDOSCOPY     gerd    reports that he has quit smoking. His smoking use included cigarettes. He has a 7.50 pack-year smoking history. He has never used smokeless tobacco. He reports current alcohol use of about 7.0 standard drinks of alcohol per week. He reports that he does not use drugs.  family history includes Coronary artery disease in his brother and sister; Heart disease in his father; Lung cancer in his sister; Lymphoma in his mother; Ovarian cancer in his sister. Allergies  Allergen Reactions  . Cefuroxime Axetil Rash   Current Outpatient Medications on File Prior to Visit  Medication Sig Dispense Refill  .  acetaminophen (TYLENOL) 650 MG CR tablet Take 650 mg by mouth every 8 (eight) hours as needed for pain.    Marland Kitchen aspirin (ECOTRIN LOW STRENGTH) 81 MG EC tablet Take 81 mg by mouth daily.      . Cyanocobalamin (B-12 PO) Take 1 tablet by mouth daily.    . hydrochlorothiazide (HYDRODIURIL) 25 MG tablet TAKE 1 TABLET BY MOUTH ONCE DAILY 90 tablet 3  . lisinopril (PRINIVIL,ZESTRIL) 20 MG tablet Take 1 tablet (20 mg total) by mouth daily. 90 tablet 3  . Melatonin 10 MG TABS Take 10 mg by mouth at bedtime.     . metoprolol tartrate (LOPRESSOR) 50 MG tablet TAKE 1/2 (ONE-HALF) TABLET BY MOUTH TWICE DAILY 90 tablet 3  . Multiple Vitamin (ONE-A-DAY 55 PLUS PO) Take 1 tablet by mouth daily.    Marland Kitchen omeprazole (PRILOSEC) 20 MG capsule TAKE 1 CAPSULE BY MOUTH TWICE DAILY 180 capsule 1  . predniSONE (DELTASONE) 10 MG tablet 3 tabs by mouth per day for 3 days,2tabs per day for 3 days,1tab per day for 3 days 18 tablet 0  . rosuvastatin (CRESTOR) 40 MG tablet Take 1 tablet by mouth once daily 90 tablet 0  . sildenafil (VIAGRA) 100 MG tablet Take 1 tablet (100 mg total) by mouth as needed for erectile dysfunction. 10 tablet 11  . terazosin (HYTRIN) 5 MG capsule Take 5 mg by mouth at bedtime.     . thiamine 250 MG tablet Take 250 mg by mouth daily.    . Turmeric 500 MG TABS Take by mouth daily.     No current facility-administered medications on file prior to visit.    Review of Systems Constitutional: Negative for other unusual diaphoresis, sweats, appetite or weight changes HENT: Negative for other worsening hearing loss, ear pain, facial swelling, mouth sores or neck stiffness.   Eyes: Negative for other worsening pain, redness or other visual disturbance.  Respiratory: Negative for other stridor or swelling Cardiovascular: Negative for other palpitations or other chest pain  Gastrointestinal: Negative for worsening diarrhea or loose stools, blood in stool, distention or other pain Genitourinary: Negative for  hematuria, flank pain or other change in urine volume.  Musculoskeletal: Negative for myalgias or other joint swelling.  Skin: Negative for other color change, or other wound or worsening drainage.  Neurological: Negative for other syncope or numbness. Hematological: Negative for other adenopathy or swelling Psychiatric/Behavioral: Negative for hallucinations, other worsening agitation, SI, self-injury, or new decreased concentration All other system neg per pt    Objective:   Physical Exam BP 118/68   Pulse 77   Temp 98.1 F (36.7 C) (Oral)   Ht 5\' 9"  (1.753 m)   Wt 249 lb (112.9 kg)   SpO2 97%   BMI 36.77 kg/m  VS noted,  Constitutional: Pt is oriented to person, place, and time. Appears well-developed and well-nourished, in no significant distress and comfortable Head: Normocephalic and atraumatic  Eyes: Conjunctivae and EOM are normal. Pupils are equal, round, and reactive to light Right Ear: External ear normal without discharge Left Ear: External ear normal without discharge Nose: Nose without discharge or deformity Mouth/Throat: Oropharynx is without other ulcerations and  moist  Neck: Normal range of motion. Neck supple. No JVD present. No tracheal deviation present or significant neck LA or mass Cardiovascular: Normal rate, regular rhythm, normal heart sounds and intact distal pulses.   Pulmonary/Chest: WOB normal and breath sounds without rales or wheezing  Abdominal: Soft. Bowel sounds are normal. NT. No HSM  Musculoskeletal: Normal range of motion. Exhibits no edema Lymphadenopathy: Has no other cervical adenopathy.  Neurological: Pt is alert and oriented to person, place, and time. Pt has normal reflexes. No cranial nerve deficit. Motor grossly intact, Gait intact Skin: Skin is warm and dry. No rash noted or new ulcerations Psychiatric:  Has normal mood and affect. Behavior is normal without agitation No other exam findings Lab Results  Component Value Date   WBC  5.4 12/07/2018   HGB 13.4 12/07/2018   HCT 40.3 12/07/2018   PLT 156.0 12/07/2018   GLUCOSE 109 (H) 12/07/2018   CHOL 170 12/07/2018   TRIG 100.0 12/07/2018   HDL 64.40 12/07/2018   LDLDIRECT 176.0 04/03/2011   LDLCALC 86 12/07/2018   ALT 55 (H) 12/07/2018   AST 37 12/07/2018   NA 140 12/07/2018   K 4.8 12/07/2018   CL 104 12/07/2018   CREATININE 0.99 12/07/2018   BUN 16 12/07/2018   CO2 29 12/07/2018   TSH 2.46 12/07/2018   PSA 0.11 12/07/2018   HGBA1C 6.7 (H) 12/07/2018      Assessment & Plan:

## 2018-12-07 NOTE — Assessment & Plan Note (Signed)
For nsaid prn, f/u. Dr Domingo Madeira

## 2018-12-07 NOTE — Assessment & Plan Note (Signed)
stable overall by history and exam, recent data reviewed with pt, and pt to continue medical treatment as before,  to f/u any worsening symptoms or concerns  

## 2018-12-07 NOTE — Assessment & Plan Note (Signed)
C/w likely djd, f/u Dr Erlinda Hong as well

## 2018-12-07 NOTE — Assessment & Plan Note (Signed)

## 2018-12-07 NOTE — Assessment & Plan Note (Signed)
No evidence for acute,  to f/u any worsening symptoms or concerns

## 2018-12-07 NOTE — Assessment & Plan Note (Signed)
Most c/w venous insufficiency, to check BNP as well

## 2018-12-07 NOTE — Patient Instructions (Addendum)
You had the Tdap tetanus shot today  Please continue all other medications as before, and refills have been done if requested - the voltaren and steroid cream as needed  Please have the pharmacy call with any other refills you may need.  Please continue your efforts at being more active, low cholesterol diet, and weight control.  You are otherwise up to date with prevention measures today.  Please keep your appointments with your specialists as you may have planned - Urology, Dr Erlinda Hong and Alain Marion  Please go to the LAB in the Basement (turn left off the elevator) for the tests to be done today  You will be contacted by phone if any changes need to be made immediately.  Otherwise, you will receive a letter about your results with an explanation, but please check with MyChart first.  Please remember to sign up for MyChart if you have not done so, as this will be important to you in the future with finding out test results, communicating by private email, and scheduling acute appointments online when needed.  Please return in 6 months, or sooner if needed, with Lab testing done 3-5 days before

## 2018-12-07 NOTE — Assessment & Plan Note (Signed)
C/w probabel hydshidrotic eczema - for triam cr prn refill

## 2018-12-07 NOTE — Assessment & Plan Note (Signed)
Cont same allergy tx, f/u ENT as planned

## 2018-12-07 NOTE — Assessment & Plan Note (Addendum)
Also for urinary studies, f/u urology as planned  In addition to the time spent performing CPE, I spent an additional 40 minutes face to face,in which greater than 50% of this time was spent in counseling and coordination of care for patient's acute illness as documented, including the differential dx, treatment, further evaluation and other management of urinary incontinence, rash, peripheral edema, LBP, left knee pain, hoarseness, hyperglycemia,

## 2018-12-09 LAB — URINE CULTURE
MICRO NUMBER:: 579157
Result:: NO GROWTH
SPECIMEN QUALITY:: ADEQUATE

## 2018-12-20 ENCOUNTER — Encounter: Payer: Self-pay | Admitting: Orthopaedic Surgery

## 2018-12-20 ENCOUNTER — Ambulatory Visit: Payer: Self-pay

## 2018-12-20 ENCOUNTER — Other Ambulatory Visit: Payer: Self-pay

## 2018-12-20 ENCOUNTER — Ambulatory Visit (INDEPENDENT_AMBULATORY_CARE_PROVIDER_SITE_OTHER): Payer: Medicare Other | Admitting: Orthopaedic Surgery

## 2018-12-20 ENCOUNTER — Telehealth: Payer: Self-pay

## 2018-12-20 DIAGNOSIS — M25562 Pain in left knee: Secondary | ICD-10-CM

## 2018-12-20 DIAGNOSIS — M25561 Pain in right knee: Secondary | ICD-10-CM

## 2018-12-20 MED ORDER — BUPIVACAINE HCL 0.25 % IJ SOLN
0.6600 mL | INTRAMUSCULAR | Status: AC | PRN
  Administered 2018-12-20: .66 mL via INTRA_ARTICULAR

## 2018-12-20 MED ORDER — LIDOCAINE HCL 1 % IJ SOLN
3.0000 mL | INTRAMUSCULAR | Status: AC | PRN
  Administered 2018-12-20: 3 mL

## 2018-12-20 MED ORDER — METHYLPREDNISOLONE ACETATE 40 MG/ML IJ SUSP
13.3300 mg | INTRAMUSCULAR | Status: AC | PRN
  Administered 2018-12-20: 13.33 mg via INTRA_ARTICULAR

## 2018-12-20 NOTE — Telephone Encounter (Signed)
Bilateral knee gel injections 

## 2018-12-20 NOTE — Progress Notes (Signed)
Office Visit Note   Patient: Warren Weeks           Date of Birth: 1946-06-08           MRN: [id number] Visit Date: 12/20/2018              Requested by: Biagio Borg, MD [place],  [place] [address] PCP: Biagio Borg, MD   Assessment & Plan: Visit Diagnoses:  1. 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  We will inject both knees with cortisone today.  We will also go ahead and send for viscosupplementation approval.  He will follow-up with [place] as needed. This patient is diagnosed with osteoarthritis of the knee(s).    Radiographs show evidence of joint space narrowing, osteophytes, subchondral sclerosis and/or subchondral cysts.  This patient has knee pain which interferes with functional and activities of daily living.    This patient has experienced inadequate response, adverse effects and/or intolerance with conservative treatments such as acetaminophen, NSAIDS, topical creams, physical therapy or regular exercise, knee bracing and/or weight loss.   This patient has experienced inadequate response or has a contraindication to intra articular steroid injections for at least 3 months.   This patient is not scheduled to have a total knee replacement within 6 months of starting treatment with viscosupplementation.   Follow-Up Instructions: Return if symptoms worsen or fail to improve.   Orders:  Orders Placed This Encounter  Procedures  . Large Joint Inj: bilateral knee  . XR KNEE 3 VIEW LEFT  . XR KNEE 3 VIEW RIGHT   No orders of the defined types were placed in this encounter.     Procedures: Large Joint Inj: bilateral knee on 12/20/2018 4:07 PM Indications: pain Details: 22 G needle, anterolateral approach Medications (Right): 0.66 mL bupivacaine 0.25 %; 3 mL lidocaine 1 %; 13.33 mg methylPREDNISolone acetate 40 MG/ML Medications (Left): 0.66 mL bupivacaine 0.25 %; 3  mL lidocaine 1 %; 13.33 mg methylPREDNISolone acetate 40 MG/ML      Clinical Data: No additional findings.   Subjective: Chief Complaint  Patient presents with  . Right Knee - Pain  . Left Knee - Pain    HPI patient is a pleasant 73 year old gentleman who presents for clinic today with bilateral knee pain left greater than right.  No known injury or change in activity.  His pain is been ongoing for the past 3 weeks.  The pain he has is to the entire aspect of both knees.  He describes this as a constant throb.  No mechanical symptoms.  He does have significant stiffness going from a seated to standing position.  He has been taking diclofenac with mild relief of symptoms.  No radicular symptoms noted.  He notes previous left knee cortisone injection several years ago.  He also notes a previous ESI.  Review of Systems as detailed in HPI.  All others reviewed and are negative.   Objective: Vital Signs: There were no vitals taken for this visit.  Physical Exam well-developed and well-nourished gentleman in no acute distress.  Alert and oriented x3.  Ortho Exam semination of both knees reveals significant varus deformity.  Range of motion 0 to 120 degrees.  Minimal joint line tenderness.  Moderate patellofemoral crepitus.  Ligaments are stable.  He is neurovascular intact distally.  Specialty Comments:  No specialty comments available.  Imaging: Xr  Knee 3 View Left  Result Date: 12/20/2018 Significant tricompartmental degenerative changes  Xr Knee 3 View Right  Result Date: 12/20/2018 Significant tricompartmental degenerative changes    PMFS History: Patient Active Problem List   Diagnosis Date Noted  . Hoarseness of voice 12/07/2018  . Urinary leakage 12/07/2018  . Left knee pain 12/07/2018  . Chronic sinusitis 10/05/2018  . 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 COPD (chronic obstructive pulmonary disease) ([place]) 05/12/2017  . Pleurisy 03/16/2017  . Bilateral leg pain  11/22/2016  . Insomnia 10/13/2016  . Cough 07/22/2016  . Wheezing 07/22/2016  . Peripheral edema 02/21/2016  . Venous insufficiency 02/05/2016  . Low back pain radiating to lower extremity 02/05/2016  . Vertigo   . Hypersomnolence 10/24/2015  . 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 Chest pain 01/18/2013  . Hematochezia 10/12/2012  . Viral illness 05/26/2012  . Palpitations 01/05/2012  . 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 Fatigue 04/09/2011  . Rash 12/22/2010  . Encounter for long-term (current) use of high-risk medication 10/08/2010  . Impaired glucose tolerance 10/04/2010  . Encounter for well adult exam with abnormal findings 10/04/2010  . Carotid artery stenosis 07/01/2009  . PVD 06/25/2009  . Allergic rhinitis 09/16/2007  . ESOPHAGEAL STRICTURE 09/16/2007  . COLONIC POLYPS, HX OF 09/16/2007  . ANXIETY 03/28/2007  . ERECTILE DYSFUNCTION 03/28/2007  . Hyperlipidemia 03/28/2007  . Essential hypertension 03/28/2007  . LOW BACK PAIN 03/28/2007  . OBESITY 03/24/2007  . HEMORRHOIDS, INTERNAL 03/24/2007  . ESOPHAGITIS 03/24/2007  . GERD 03/24/2007   Past Medical History:  Diagnosis Date  . ALLERGIC RHINITIS 09/16/2007  . ALLERGY 03/24/2007  . Allergy   . ANXIETY 03/28/2007  . Arthritis    knees, back,shoulder  . CAROTID ARTERY STENOSIS, BILATERAL 07/01/2009   yearly checks - no current problems  . CEPHALGIA 06/08/2009  . CHEST PAIN 09/16/2007   no current problems  . COLONIC POLYPS, HX OF 09/16/2007  . COPD (chronic obstructive pulmonary disease) ([place]) 05/12/2017   mild per patient - no inhaler  . DEPRESSION 03/28/2007  . Dizziness and giddiness 06/08/2009  . 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 DYSPNEA/SHORTNESS OF BREATH 09/16/2007  . ERECTILE DYSFUNCTION 03/28/2007  . ESOPHAGEAL STRICTURE 09/16/2007  . ESOPHAGITIS 03/24/2007  . GERD 03/24/2007  . GLUCOSE INTOLERANCE 09/16/2007  . HEMORRHOIDS, INTERNAL 03/24/2007  . HYPERCHOLESTEROLEMIA 03/24/2007  . HYPERLIPIDEMIA 09/16/2007   . HYPERTENSION 03/28/2007  . 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 Impaired glucose tolerance 10/04/2010  . LOW BACK PAIN 03/28/2007  . MAGNETIC RESONANCE IMAGING, BRAIN, ABNORMAL 06/26/2009  . NECK PAIN, LEFT 06/20/2010  . OBESITY 03/24/2007  . OTITIS MEDIA, LEFT 06/10/2009  . Palpitations 06/20/2010   no current problems  . PVD 06/25/2009  . RASH-NONVESICULAR 06/17/2009  . URI 08/16/2009    Family History  Problem Relation Age of Onset  . Lymphoma Mother   . Heart disease Father        CHF  . Ovarian cancer Sister   . Lung cancer Sister   . Coronary artery disease Sister        CABG  . Coronary artery disease Brother        stent, and CAD  . Colon cancer Neg Hx   . Esophageal cancer Neg Hx   . Rectal cancer Neg Hx   . Stomach cancer Neg Hx   . Colon polyps Neg Hx     Past Surgical History:  Procedure Laterality Date  . COLONOSCOPY  03/2013   polyps/[place]  . s/p right knee arthroscopy    .  UPPER GASTROINTESTINAL ENDOSCOPY     [facility]   Social History   Occupational History  . Occupation: ship and Physiological scientist: [employer]  Tobacco Use  . Smoking status: Former Smoker    Packs/day: 0.50    Years: 15.00    Pack years: 7.50    Types: Cigarettes  . Smokeless tobacco: Never Used  . Tobacco comment: Quit 30 years ago  Substance and Sexual Activity  . Alcohol use: Yes    Alcohol/week: 7.0 standard drinks    Types: 7 Cans of beer per week    Comment: beer most days  . Drug use: No  . Sexual activity: Yes

## 2018-12-26 NOTE — Telephone Encounter (Signed)
Noted  

## 2018-12-27 ENCOUNTER — Telehealth: Payer: Self-pay

## 2018-12-27 NOTE — Telephone Encounter (Signed)
Submiteed VOB for SynviscOne, bilateral knee.

## 2018-12-28 ENCOUNTER — Telehealth: Payer: Self-pay

## 2018-12-28 NOTE — Telephone Encounter (Signed)
Talked with patient and advised him that he is approved for gel injection.  Approved for SynviscOne, bilateral knee. [facility] Covered at 100% through insurance after a co-pay Co-pay of $50.00 required No PA required  Appt.01/03/2019 with Dr. Ninfa Linden

## 2019-01-03 ENCOUNTER — Ambulatory Visit: Payer: Medicare Other | Admitting: Orthopaedic Surgery

## 2019-01-04 ENCOUNTER — Ambulatory Visit (INDEPENDENT_AMBULATORY_CARE_PROVIDER_SITE_OTHER): Payer: Medicare Other | Admitting: Orthopaedic Surgery

## 2019-01-04 ENCOUNTER — Encounter: Payer: Self-pay | Admitting: Orthopaedic Surgery

## 2019-01-04 ENCOUNTER — Other Ambulatory Visit: Payer: Self-pay

## 2019-01-04 DIAGNOSIS — M1711 Unilateral primary osteoarthritis, right knee: Secondary | ICD-10-CM | POA: Diagnosis not present

## 2019-01-04 DIAGNOSIS — M1712 Unilateral primary osteoarthritis, left knee: Secondary | ICD-10-CM

## 2019-01-04 MED ORDER — HYLAN G-F 20 48 MG/6ML IX SOSY
48.0000 mg | PREFILLED_SYRINGE | INTRA_ARTICULAR | Status: AC | PRN
  Administered 2019-01-04: 48 mg via INTRA_ARTICULAR

## 2019-01-04 NOTE — Progress Notes (Signed)
   Procedure Note  Patient: Warren Weeks             Date of Birth: 1946-04-06           MRN: [id number]             Visit Date: 01/04/2019  Procedures: Visit Diagnoses: No diagnosis found.  Large Joint Inj: R knee on 01/04/2019 10:42 AM Indications: pain and diagnostic evaluation Details: 22 G 1.5 in needle, superolateral approach  Arthrogram: No  Medications: 48 mg Hylan 48 MG/6ML Outcome: tolerated well, no immediate complications Procedure, treatment alternatives, risks and benefits explained, specific risks discussed. Consent was given by the patient. Immediately prior to procedure a time out was called to verify the correct patient, procedure, equipment, support staff and site/side marked as required. Patient was prepped and draped in the usual sterile fashion.   Large Joint Inj: L knee on 01/04/2019 10:43 AM Indications: pain and diagnostic evaluation Details: 22 G 1.5 in needle, superolateral approach  Arthrogram: No  Medications: 48 mg Hylan 48 MG/6ML Outcome: tolerated well, no immediate complications Procedure, treatment alternatives, risks and benefits explained, specific risks discussed. Consent was given by the patient. Immediately prior to procedure a time out was called to verify the correct patient, procedure, equipment, support staff and site/side marked as required. Patient was prepped and draped in the usual sterile fashion.    The patient is here today for scheduled hyaluronic acid injections in both knees with Synvisc 1 to treat the pain from osteoarthritis.  He is tried and failed other forms conservative treatment including activity modification, weight loss, quad strengthening exercises, anti-inflammatory medications and steroid injections.  This is the next step in order to hopefully ease the pain of his knees that he experiences from his osteoarthritis.  This is been well documented on x-rays and clinical exam.  He has had no other acute changes in his medical  status.  On examination of both knees there is patellofemoral crepitation with varus malalignment and medial joint line tenderness.  There is no effusion of either knee.  Both knees have good range of motion and are ligamentously stable.  He tolerated the Synvisc injection in both knees today.  All question concerns were answered and addressed.  Follow-up will be as needed.

## 2019-01-05 ENCOUNTER — Other Ambulatory Visit: Payer: Self-pay | Admitting: Internal Medicine

## 2019-01-24 ENCOUNTER — Other Ambulatory Visit: Payer: Self-pay | Admitting: Internal Medicine

## 2019-02-13 ENCOUNTER — Other Ambulatory Visit: Payer: Self-pay | Admitting: Internal Medicine

## 2019-02-17 ENCOUNTER — Telehealth: Payer: Self-pay

## 2019-02-17 DIAGNOSIS — I6523 Occlusion and stenosis of bilateral carotid arteries: Secondary | ICD-10-CM

## 2019-02-17 NOTE — Telephone Encounter (Signed)
The testing people normally call at one yr to have pt do this, but I suspect this was interrupted by the pandemic  I will go ahead and reorder

## 2019-02-17 NOTE — Telephone Encounter (Signed)
Copied from [place] [phone]. Topic: General - Other >> Feb 16, 2019  4:09 PM Rainey Pines A wrote: Patient would like a callback in regards to testing for his artery. Patient was under the impression that he was to have the testing done every year and needed a referral from Dr. Jenny Reichmann.

## 2019-02-17 NOTE — Addendum Note (Signed)
Addended by: Biagio Borg on: 02/17/2019 11:37 AM   Modules accepted: Orders

## 2019-02-21 ENCOUNTER — Encounter: Payer: Self-pay | Admitting: Internal Medicine

## 2019-02-21 ENCOUNTER — Ambulatory Visit (INDEPENDENT_AMBULATORY_CARE_PROVIDER_SITE_OTHER): Payer: Medicare Other | Admitting: Internal Medicine

## 2019-02-21 ENCOUNTER — Other Ambulatory Visit: Payer: Self-pay

## 2019-02-21 VITALS — BP 136/84 | HR 80 | Temp 97.6°F | Ht 69.0 in | Wt 252.0 lb

## 2019-02-21 DIAGNOSIS — J449 Chronic obstructive pulmonary disease, unspecified: Secondary | ICD-10-CM | POA: Diagnosis not present

## 2019-02-21 DIAGNOSIS — Z23 Encounter for immunization: Secondary | ICD-10-CM

## 2019-02-21 DIAGNOSIS — I872 Venous insufficiency (chronic) (peripheral): Secondary | ICD-10-CM

## 2019-02-21 DIAGNOSIS — R7302 Impaired glucose tolerance (oral): Secondary | ICD-10-CM

## 2019-02-21 DIAGNOSIS — I10 Essential (primary) hypertension: Secondary | ICD-10-CM | POA: Diagnosis not present

## 2019-02-21 LAB — POCT GLYCOSYLATED HEMOGLOBIN (HGB A1C): Hemoglobin A1C: 5.9 % — AB (ref 4.0–5.6)

## 2019-02-21 MED ORDER — METOPROLOL TARTRATE 50 MG PO TABS: ORAL_TABLET | ORAL | 3 refills | Status: DC

## 2019-02-21 NOTE — Progress Notes (Signed)
Subjective:    Patient ID: Warren Weeks, male    DOB: 1945-07-12, 73 y.o.   MRN: [id number]  HPI  Here to f/u; overall doing ok,  Pt denies chest pain, increasing sob or doe, wheezing, orthopnea, PND, increased LE swelling, palpitations, dizziness or syncope.  Pt denies new neurological symptoms such as new headache, or facial or extremity weakness or numbness.  Pt denies polydipsia, polyuria.  Also reports BP high at urology, sharp pain in the head and dizziness, not sure of BP numbers. . Has checked BP at home with machine and seems about 150/92 on average.   Plans to call ENT Dr Benjamine Mola for tx of sinus polyps/chronic sinusitis, needs sinus surgury but cannot with elevated BP Wt Readings from Last 3 Encounters:  02/21/19 252 lb (114.3 kg)  12/07/18 249 lb (112.9 kg)  09/14/18 250 lb (113.4 kg)   Past Medical History:  Diagnosis Date  . ALLERGIC RHINITIS 09/16/2007  . ALLERGY 03/24/2007  . Allergy   . ANXIETY 03/28/2007  . Arthritis    knees, back,shoulder  . CAROTID ARTERY STENOSIS, BILATERAL 07/01/2009   yearly checks - no current problems  . CEPHALGIA 06/08/2009  . CHEST PAIN 09/16/2007   no current problems  . COLONIC POLYPS, HX OF 09/16/2007  . COPD (chronic obstructive pulmonary disease) ([place]) 05/12/2017   mild per patient - no inhaler  . DEPRESSION 03/28/2007  . Dizziness and giddiness 06/08/2009  . 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 DYSPNEA/SHORTNESS OF BREATH 09/16/2007  . ERECTILE DYSFUNCTION 03/28/2007  . ESOPHAGEAL STRICTURE 09/16/2007  . ESOPHAGITIS 03/24/2007  . GERD 03/24/2007  . GLUCOSE INTOLERANCE 09/16/2007  . HEMORRHOIDS, INTERNAL 03/24/2007  . HYPERCHOLESTEROLEMIA 03/24/2007  . HYPERLIPIDEMIA 09/16/2007  . HYPERTENSION 03/28/2007  . 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 Impaired glucose tolerance 10/04/2010  . LOW BACK PAIN 03/28/2007  . MAGNETIC RESONANCE IMAGING, BRAIN, ABNORMAL 06/26/2009  . NECK PAIN, LEFT 06/20/2010  . OBESITY 03/24/2007  . OTITIS MEDIA, LEFT 06/10/2009   . Palpitations 06/20/2010   no current problems  . PVD 06/25/2009  . RASH-NONVESICULAR 06/17/2009  . URI 08/16/2009   Past Surgical History:  Procedure Laterality Date  . COLONOSCOPY  03/2013   polyps/[place]  . s/p right knee arthroscopy    . UPPER GASTROINTESTINAL ENDOSCOPY     gerd    reports that he has quit smoking. His smoking use included cigarettes. He has a 7.50 pack-year smoking history. He has never used smokeless tobacco. He reports current alcohol use of about 7.0 standard drinks of alcohol per week. He reports that he does not use drugs. family history includes Coronary artery disease in his brother and sister; Heart disease in his father; Lung cancer in his sister; Lymphoma in his mother; Ovarian cancer in his sister. Allergies  Allergen Reactions  . Cefuroxime Axetil Rash   Current Outpatient Medications on File Prior to Visit  Medication Sig Dispense Refill  . acetaminophen (TYLENOL) 650 MG CR tablet Take 650 mg by mouth every 8 (eight) hours as needed for pain.    Marland Kitchen aspirin (ECOTRIN LOW STRENGTH) 81 MG EC tablet Take 81 mg by mouth daily.      . Cyanocobalamin (B-12 PO) Take 1 tablet by mouth daily.    . diclofenac (VOLTAREN) 75 MG EC tablet Take 1 tablet (75 mg total) by mouth 2 (two) times daily as needed (for pain). 180 tablet 1  . ezetimibe (ZETIA) 10 MG tablet Take 1 tablet (10 mg total) by mouth daily. 90 tablet  1  . fluticasone (FLONASE) 50 MCG/ACT nasal spray USE 2 SPRAY(S) IN EACH NOSTRIL ONCE DAILY 16 g 5  . hydrochlorothiazide (HYDRODIURIL) 25 MG tablet TAKE 1 TABLET BY MOUTH ONCE DAILY 90 tablet 3  . lisinopril (ZESTRIL) 20 MG tablet Take 1 tablet by mouth once daily 90 tablet 1  . Melatonin 10 MG TABS Take 10 mg by mouth at bedtime.     . Multiple Vitamin (ONE-A-DAY 55 PLUS PO) Take 1 tablet by mouth daily.    Marland Kitchen omeprazole (PRILOSEC) 20 MG capsule TAKE 1 CAPSULE BY MOUTH TWICE DAILY 180 capsule 1  . rosuvastatin (CRESTOR) 40 MG tablet Take 1 tablet by mouth  once daily 90 tablet 1  . sildenafil (VIAGRA) 100 MG tablet Take 1 tablet (100 mg total) by mouth as needed for erectile dysfunction. 10 tablet 11  . terazosin (HYTRIN) 5 MG capsule Take 5 mg by mouth at bedtime.     . thiamine 250 MG tablet Take 250 mg by mouth daily.    Marland Kitchen triamcinolone cream (KENALOG) 0.1 % 1APPLY  CREAM EXTERNALLY TWICE DAILY 30 g 1  . Turmeric 500 MG TABS Take by mouth daily.     No current facility-administered medications on file prior to visit.    Review of Systems  Constitutional: Negative for other unusual diaphoresis or sweats HENT: Negative for ear discharge or swelling Eyes: Negative for other worsening visual disturbances Respiratory: Negative for stridor or other swelling  Gastrointestinal: Negative for worsening distension or other blood Genitourinary: Negative for retention or other urinary change Musculoskeletal: Negative for other MSK pain or swelling Skin: Negative for color change or other new lesions Neurological: Negative for worsening tremors and other numbness  Psychiatric/Behavioral: Negative for worsening agitation or other fatigue All other system neg per pt    Objective:    Physical Exam BP 136/84   Pulse 80   Temp 97.6 F (36.4 C) (Oral)   Ht 5\' 9"  (1.753 m)   Wt 252 lb (114.3 kg)   SpO2 96%   BMI 37.21 kg/m  VS noted,  Constitutional: Pt appears in NAD HENT: Head: NCAT.  Right Ear: External ear normal.  Left Ear: External ear normal.  Eyes: . Pupils are equal, round, and reactive to light. Conjunctivae and EOM are normal Nose: without d/c or deformity Neck: Neck supple. Gross normal ROM Cardiovascular: Normal rate and regular rhythm.   Pulmonary/Chest: Effort normal and breath sounds without rales or wheezing.  Abd:  Soft, NT, ND, + BS, no organomegaly Neurological: Pt is alert. At baseline orientation, motor grossly intact Skin: Skin is warm. No rashes, other new lesions, trace to 1+ bilat LE edema Psychiatric: Pt behavior  is normal without agitation  No other exam findings  POCT glycosylated hemoglobin (Hb A1C) Order: [id number] Status:  Final result Visible to patient:  No (not released) Dx:  Impaired glucose tolerance  Ref Range & Units 1d ago (02/21/19) 60mo ago (12/07/18) 43mo ago (06/03/18) 84yr ago (12/01/17) 29yr ago (11/20/16) 30yr ago (12/23/15) 9yr ago (10/24/15)  Hemoglobin A1C 4.0 - 5.6 % 5.9Abnormal   6.7High  R, CM  6.0Abnormal   6.4 R, CM  6.4 R, CM  6.1High  R, CM  6.5 R, CM        Lab Results  Component Value Date   WBC 5.4 12/07/2018   HGB 13.4 12/07/2018   HCT 40.3 12/07/2018   PLT 156.0 12/07/2018   GLUCOSE 109 (H) 12/07/2018   CHOL 170 12/07/2018  TRIG 100.0 12/07/2018   HDL 64.40 12/07/2018   LDLDIRECT 176.0 04/03/2011   LDLCALC 86 12/07/2018   ALT 55 (H) 12/07/2018   AST 37 12/07/2018   NA 140 12/07/2018   K 4.8 12/07/2018   CL 104 12/07/2018   CREATININE 0.99 12/07/2018   BUN 16 12/07/2018   CO2 29 12/07/2018   TSH 2.46 12/07/2018   PSA 0.11 12/07/2018   HGBA1C 5.9 (A) 02/21/2019      Assessment & Plan:

## 2019-02-21 NOTE — Patient Instructions (Addendum)
Your A1c was OK today  OK to increase the metoprolol to 50 mg twice per day  Please continue all other medications as before, and refills have been done if requested.  Please have the pharmacy call with any other refills you may need.  Please continue your efforts at being more active, low cholesterol diet, and weight control.  Please keep your appointments with your specialists as you may have planned

## 2019-02-22 ENCOUNTER — Encounter: Payer: Self-pay | Admitting: Internal Medicine

## 2019-02-22 NOTE — Assessment & Plan Note (Signed)
stable overall by history and exam, recent data reviewed with pt, and pt to continue medical treatment as before,  to f/u any worsening symptoms or concerns  

## 2019-02-22 NOTE — Assessment & Plan Note (Signed)
For hct restart, also compression stocking, note bnp normal recently

## 2019-02-22 NOTE — Assessment & Plan Note (Signed)
Uncontrolled, to restart hct asd,  to f/u any worsening symptoms or concerns

## 2019-03-06 ENCOUNTER — Other Ambulatory Visit: Payer: Self-pay | Admitting: Internal Medicine

## 2019-03-14 ENCOUNTER — Ambulatory Visit: Payer: Medicare Other | Admitting: Orthopaedic Surgery

## 2019-03-15 ENCOUNTER — Ambulatory Visit: Payer: Self-pay

## 2019-03-15 ENCOUNTER — Encounter: Payer: Self-pay | Admitting: Orthopaedic Surgery

## 2019-03-15 ENCOUNTER — Ambulatory Visit (INDEPENDENT_AMBULATORY_CARE_PROVIDER_SITE_OTHER): Payer: Medicare Other | Admitting: Orthopaedic Surgery

## 2019-03-15 VITALS — Ht 69.0 in | Wt 252.0 lb

## 2019-03-15 DIAGNOSIS — M545 Low back pain, unspecified: Secondary | ICD-10-CM

## 2019-03-15 MED ORDER — PREDNISONE 10 MG (21) PO TBPK: ORAL_TABLET | ORAL | 0 refills | Status: DC

## 2019-03-15 MED ORDER — TIZANIDINE HCL 2 MG PO TABS: 2.0000 mg | ORAL_TABLET | Freq: Two times a day (BID) | ORAL | 0 refills | Status: DC | PRN

## 2019-03-15 NOTE — Progress Notes (Signed)
Office Visit Note   Patient: Warren Weeks           Date of Birth: 03/24/1946           MRN: [id number] Visit Date: 03/15/2019              Requested by: Biagio Borg, MD [place],  [place] [id number] PCP: Biagio Borg, MD   Assessment & Plan: Visit Diagnoses:  1. 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  We will start the patient with Sterapred taper muscle relaxer.  He has been to physical therapy in the past and still has [facility]  He will follow-up with [place] if he has not improved over the next several months.  Call with concerns or questions meantime.  Follow-Up Instructions: Return if symptoms worsen or fail to improve.   Orders:  Orders Placed This Encounter  Procedures  . XR Lumbar Spine 2-3 Views   Meds ordered this encounter  Medications  . predniSONE (STERAPRED UNI-PAK 21 TAB) 10 MG (21) TBPK tablet    Sig: Take as directed    Dispense:  21 tablet    Refill:  0  . tiZANidine (ZANAFLEX) 2 MG tablet    Sig: Take 1 tablet (2 mg total) by mouth 2 (two) times daily as needed for muscle spasms.    Dispense:  30 tablet    Refill:  0      Procedures: No procedures performed   Clinical Data: No additional findings.   Subjective: Chief Complaint  Patient presents with  . Lower Back - Pain  . Right Hip - Pain  . Left Hip - Pain    HPI patient is a pleasant 73 year old gentleman who presents our clinic today with bilateral lower back pain which has been ongoing for the past 4 to 5 years.  The pain he has is to the lower back and radiates into the buttocks and the lateral hip and occasionally down the back of the left leg.  He denies any weakness.  His pain is aggravated when standing or walking for too long period of time.  He has been taking diclofenac  pills as well as using diclofenac gel without significant relief of symptoms.  He does note an occasional burn to the medial ankle.  He denies any bowel or bladder change and no saddle paresthesias.  He does note that he had epidural steroid injections by Dr. Ernestina Patches several years ago which he thinks may have helped with his pain.  Review of Systems as detailed in HPI.  All others reviewed and are negative.   Objective: Vital Signs: Ht 5\' 9"  (1.753 m)   Wt 252 lb (114.3 kg)   BMI 37.21 kg/m   Physical Exam well-developed well-nourished gentleman in no acute distress.  Alert and oriented x3.  Ortho Exam examination of his lower back reveals increased pain with lumbar extension.  No pain with flexion.  No paraspinous or spinous tenderness.  Positive straight leg raise on the left negative on the right.  Negative logroll bilaterally.  No focal weakness.  He is neurovascular intact distally.  Specialty Comments:  No specialty comments available.  Imaging: Xr Lumbar Spine 2-3 Views  Result Date: 03/15/2019 Moderate to space narrowing L5-S1    PMFS History: Patient Active  Problem List   Diagnosis Date Noted  . Hoarseness of voice 12/07/2018  . Urinary leakage 12/07/2018  . Left knee pain 12/07/2018  . Chronic sinusitis 10/05/2018  . 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 COPD (chronic obstructive pulmonary disease) ([place]) 05/12/2017  . Pleurisy 03/16/2017  . Bilateral leg pain 11/22/2016  . Insomnia 10/13/2016  . Cough 07/22/2016  . Wheezing 07/22/2016  . Peripheral edema 02/21/2016  . Venous insufficiency 02/05/2016  . Low back pain radiating to lower extremity 02/05/2016  . Vertigo   . Hypersomnolence 10/24/2015  . 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 Chest pain 01/18/2013  . Hematochezia 10/12/2012  . Viral illness 05/26/2012  . Palpitations 01/05/2012  . 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 Fatigue 04/09/2011  . Rash 12/22/2010  . Encounter for long-term (current) use of  high-risk medication 10/08/2010  . Impaired glucose tolerance 10/04/2010  . Encounter for well adult exam with abnormal findings 10/04/2010  . Carotid artery stenosis 07/01/2009  . PVD 06/25/2009  . Allergic rhinitis 09/16/2007  . ESOPHAGEAL STRICTURE 09/16/2007  . COLONIC POLYPS, HX OF 09/16/2007  . ANXIETY 03/28/2007  . ERECTILE DYSFUNCTION 03/28/2007  . Hyperlipidemia 03/28/2007  . Essential hypertension 03/28/2007  . LOW BACK PAIN 03/28/2007  . OBESITY 03/24/2007  . HEMORRHOIDS, INTERNAL 03/24/2007  . ESOPHAGITIS 03/24/2007  . GERD 03/24/2007   Past Medical History:  Diagnosis Date  . ALLERGIC RHINITIS 09/16/2007  . ALLERGY 03/24/2007  . Allergy   . ANXIETY 03/28/2007  . Arthritis    knees, back,shoulder  . CAROTID ARTERY STENOSIS, BILATERAL 07/01/2009   yearly checks - no current problems  . CEPHALGIA 06/08/2009  . CHEST PAIN 09/16/2007   no current problems  . COLONIC POLYPS, HX OF 09/16/2007  . COPD (chronic obstructive pulmonary disease) ([place]) 05/12/2017   mild per patient - no inhaler  . DEPRESSION 03/28/2007  . Dizziness and giddiness 06/08/2009  . 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 DYSPNEA/SHORTNESS OF BREATH 09/16/2007  . ERECTILE DYSFUNCTION 03/28/2007  . ESOPHAGEAL STRICTURE 09/16/2007  . ESOPHAGITIS 03/24/2007  . GERD 03/24/2007  . GLUCOSE INTOLERANCE 09/16/2007  . HEMORRHOIDS, INTERNAL 03/24/2007  . HYPERCHOLESTEROLEMIA 03/24/2007  . HYPERLIPIDEMIA 09/16/2007  . HYPERTENSION 03/28/2007  . 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 Impaired glucose tolerance 10/04/2010  . LOW BACK PAIN 03/28/2007  . MAGNETIC RESONANCE IMAGING, BRAIN, ABNORMAL 06/26/2009  . NECK PAIN, LEFT 06/20/2010  . OBESITY 03/24/2007  . OTITIS MEDIA, LEFT 06/10/2009  . Palpitations 06/20/2010   no current problems  . PVD 06/25/2009  . RASH-NONVESICULAR 06/17/2009  . URI 08/16/2009    Family History  Problem Relation Age of Onset  . Lymphoma Mother   . Heart disease Father        CHF  . Ovarian cancer  Sister   . Lung cancer Sister   . Coronary artery disease Sister        CABG  . Coronary artery disease Brother        stent, and CAD  . Colon cancer Neg Hx   . Esophageal cancer Neg Hx   . Rectal cancer Neg Hx   . Stomach cancer Neg Hx   . Colon polyps Neg Hx     Past Surgical History:  Procedure Laterality Date  . COLONOSCOPY  03/2013   polyps/[place]  . s/p right knee arthroscopy    . UPPER GASTROINTESTINAL ENDOSCOPY     [facility]   Social History   Occupational History  . Occupation: Engineer, building services: [employer]  Tobacco Use  . Smoking status: Former Smoker    Packs/day: 0.50    Years: 15.00    Pack years: 7.50    Types: Cigarettes  . Smokeless tobacco: Never Used  . Tobacco comment: Quit 30 years ago  Substance and Sexual Activity  . Alcohol use: Yes    Alcohol/week: 7.0 standard drinks    Types: 7 Cans of beer per week    Comment: beer most days  . Drug use: No  . Sexual activity: Yes

## 2019-03-23 NOTE — Progress Notes (Addendum)
Subjective:   Warren Weeks is a 73 y.o. male who presents for Medicare Annual/Subsequent preventive examination. I connected with patient by a telephone and verified that I am speaking with the correct person using two identifiers. Patient stated full name and DOB. Patient gave permission to continue with telephonic visit. Patient's location was at home and Nurse's location was at [place] office.   Review of Systems:   Cardiac Risk Factors include: advanced age (>54men, >32 women);male gender;obesity (BMI >30kg/m2);hypertension Sleep patterns: feels rested on waking, gets up 2-3 times nightly to void and sleeps 5-6 hours nightly. .  Home Safety/Smoke Alarms: Feels safe in home. Smoke alarms in place.  Living environment; residence and Firearm Safety: 1-story house/ trailer. Lives with wife, no needs for DME, good support system Seat Belt Safety/Bike Helmet: Wears seat belt.      Objective:    Vitals: There were no vitals taken for this visit.  There is no height or weight on file to calculate BMI.  Advanced Directives 03/24/2019 03/16/2018 12/23/2015 12/22/2015  Does Patient Have a Medical Advance Directive? No No No No  Would patient like information on creating a medical advance directive? No - Patient declined Yes (ED - Information included in AVS) No - patient declined information No - patient declined information    Tobacco Social History   Tobacco Use  Smoking Status Former Smoker  . Packs/day: 0.50  . Years: 15.00  . Pack years: 7.50  . Types: Cigarettes  Smokeless Tobacco Never Used  Tobacco Comment   Quit 30 years ago     Counseling given: Not Answered Comment: Quit 30 years ago  Past Medical History:  Diagnosis Date  . ALLERGIC RHINITIS 09/16/2007  . ALLERGY 03/24/2007  . Allergy   . ANXIETY 03/28/2007  . Arthritis    knees, back,shoulder  . CAROTID ARTERY STENOSIS, BILATERAL 07/01/2009   yearly checks - no current problems  . CEPHALGIA 06/08/2009  . CHEST PAIN  09/16/2007   no current problems  . COLONIC POLYPS, HX OF 09/16/2007  . COPD (chronic obstructive pulmonary disease) ([place]) 05/12/2017   mild per patient - no inhaler  . Dizziness and giddiness 06/08/2009  . 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 DYSPNEA/SHORTNESS OF BREATH 09/16/2007  . ERECTILE DYSFUNCTION 03/28/2007  . ESOPHAGEAL STRICTURE 09/16/2007  . ESOPHAGITIS 03/24/2007  . GERD 03/24/2007  . GLUCOSE INTOLERANCE 09/16/2007  . HEMORRHOIDS, INTERNAL 03/24/2007  . HYPERCHOLESTEROLEMIA 03/24/2007  . HYPERLIPIDEMIA 09/16/2007  . HYPERTENSION 03/28/2007  . 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 Impaired glucose tolerance 10/04/2010  . LOW BACK PAIN 03/28/2007  . MAGNETIC RESONANCE IMAGING, BRAIN, ABNORMAL 06/26/2009  . NECK PAIN, LEFT 06/20/2010  . OBESITY 03/24/2007  . OTITIS MEDIA, LEFT 06/10/2009  . Palpitations 06/20/2010   no current problems  . PVD 06/25/2009  . RASH-NONVESICULAR 06/17/2009  . URI 08/16/2009   Past Surgical History:  Procedure Laterality Date  . COLONOSCOPY  03/2013   polyps/[place]  . s/p right knee arthroscopy    . UPPER GASTROINTESTINAL ENDOSCOPY     [facility]   Family History  Problem Relation Age of Onset  . Lymphoma Mother   . Heart disease Father        CHF  . Ovarian cancer Sister   . Lung cancer Sister   . Coronary artery disease Sister        CABG  . Coronary artery disease Brother        stent, and CAD  . Colon cancer Neg Hx   .  Esophageal cancer Neg Hx   . Rectal cancer Neg Hx   . Stomach cancer Neg Hx   . Colon polyps Neg Hx    Social History   Socioeconomic History  . Marital status: Married    Spouse name: Charleston Ropes  . Number of children: 2  . Years of education: Not on file  . Highest education level: Not on file  Occupational History  . Occupation: ship and Physiological scientist: [employer]  . Financial resource strain: Not hard at all  . Food insecurity    Worry: Never true    Inability: Never true  . Transportation needs     Medical: No    Non-medical: No  Tobacco Use  . Smoking status: Former Smoker    Packs/day: 0.50    Years: 15.00    Pack years: 7.50    Types: Cigarettes  . Smokeless tobacco: Never Used  . Tobacco comment: Quit 30 years ago  Substance and Sexual Activity  . Alcohol use: Yes    Alcohol/week: 7.0 standard drinks    Types: 7 Cans of beer per week    Comment: beer most days  . Drug use: No  . Sexual activity: Yes  Lifestyle  . Physical activity    Days per week: 0 days    Minutes per session: 0 min  . Stress: Not at all  Relationships  . Social connections    Talks on phone: More than three times a week    Gets together: More than three times a week    Attends religious service: 1 to 4 times per year    Active member of club or organization: Yes    Attends meetings of clubs or organizations: More than 4 times per year    Relationship status: Married  Other Topics Concern  . Not on file  Social History Narrative  . Not on file    Outpatient Encounter Medications as of 03/24/2019  Medication Sig  . acetaminophen (TYLENOL) 650 MG CR tablet Take 650 mg by mouth every 8 (eight) hours as needed for pain.  Marland Kitchen aspirin (ECOTRIN LOW STRENGTH) 81 MG EC tablet Take 81 mg by mouth daily.    . Cyanocobalamin (B-12 PO) Take 1 tablet by mouth daily.  . diclofenac (VOLTAREN) 75 MG EC tablet Take 1 tablet (75 mg total) by mouth 2 (two) times daily as needed (for pain).  Marland Kitchen ezetimibe (ZETIA) 10 MG tablet Take 1 tablet (10 mg total) by mouth daily.  . fluticasone (FLONASE) 50 MCG/ACT nasal spray USE 2 SPRAY(S) IN EACH NOSTRIL ONCE DAILY  . hydrochlorothiazide (HYDRODIURIL) 25 MG tablet TAKE 1 TABLET BY MOUTH ONCE DAILY  . lisinopril (ZESTRIL) 20 MG tablet Take 1 tablet by mouth once daily  . Melatonin 10 MG TABS Take 10 mg by mouth at bedtime.   . metoprolol tartrate (LOPRESSOR) 50 MG tablet Take 1 tablet by mouth twice daily  . Multiple Vitamin (ONE-A-DAY 55 PLUS PO) Take 1 tablet by mouth  daily.  Marland Kitchen omeprazole (PRILOSEC) 20 MG capsule Take 1 capsule by mouth twice daily  . rosuvastatin (CRESTOR) 40 MG tablet Take 1 tablet by mouth once daily  . sildenafil (VIAGRA) 100 MG tablet Take 1 tablet (100 mg total) by mouth as needed for erectile dysfunction.  Marland Kitchen terazosin (HYTRIN) 5 MG capsule Take 5 mg by mouth at bedtime.   . thiamine 250 MG tablet Take 250 mg by mouth daily.  Marland Kitchen tiZANidine (  ZANAFLEX) 2 MG tablet Take 1 tablet (2 mg total) by mouth 2 (two) times daily as needed for muscle spasms.  Marland Kitchen triamcinolone cream (KENALOG) 0.1 % 1APPLY  CREAM EXTERNALLY TWICE DAILY  . Turmeric 500 MG TABS Take by mouth daily.  . [DISCONTINUED] predniSONE (STERAPRED UNI-PAK 21 TAB) 10 MG (21) TBPK tablet Take as directed (Patient not taking: Reported on 03/24/2019)   No facility-administered encounter medications on file as of 03/24/2019.     Activities of Daily Living In your present state of health, do you have any difficulty performing the following activities: 03/24/2019  Hearing? N  Vision? N  Difficulty concentrating or making decisions? N  Walking or climbing stairs? N  Dressing or bathing? N  Doing errands, shopping? N  Preparing Food and eating ? N  Using the Toilet? N  In the past six months, have you accidently leaked urine? N  Do you have problems with loss of bowel control? N  Managing your Medications? N  Managing your Finances? N  Housekeeping or managing your Housekeeping? N  Some recent data might be hidden    Patient Care Team: Biagio Borg, MD as PCP - General   Assessment:   This is a routine wellness examination for [place]. Physical assessment deferred to PCP.  Exercise Activities and Dietary recommendations Current Exercise Habits: The patient does not participate in regular exercise at present([employer] senior exercise TV program resource provided), Exercise limited by: orthopedic condition(s)  Diet (meal preparation, eat out, water intake, caffeinated beverages,  dairy products, fruits and vegetables): in general, a "healthy" diet     Reviewed heart healthy and diabetic diet. Encouraged patient to increase daily water and healthy fluid intake.  Goals    . Patient Stated     Increase my physical activity by starting to go back to the [facility] and start to walk around my neighborhood.    . Patient Stated     I want to lose weight by monitoring my diet       Fall Risk Fall Risk  03/24/2019 12/07/2018 09/14/2018 06/03/2018 03/16/2018  Falls in the past year? 0 0 0 0 Yes  Number falls in past yr: 0 - - - 1  Injury with Fall? 0 - - - No  Follow up - - - - Falls prevention discussed    Depression Screen PHQ 2/9 Scores 03/24/2019 12/07/2018 09/14/2018 03/16/2018  PHQ - 2 Score 0 0 0 0  PHQ- 9 Score - - - -    Cognitive Function       Ad8 score reviewed for issues:  Issues making decisions: no  Less interest in hobbies / activities: no  Repeats questions, stories (family complaining): no  Trouble using ordinary gadgets (microwave, computer, phone):no  Forgets the month or year: no  Mismanaging finances: no  Remembering appts: no  Daily problems with thinking and/or memory: no Ad8 score is= 0  Immunization History  Administered Date(s) Administered  . Fluad Quad(high Dose 65+) 02/21/2019  . H1N1 04/22/2008  . Influenza Whole 06/10/2009  . Influenza, High Dose Seasonal PF 03/20/2016, 03/16/2017, 03/16/2018  . Influenza,inj,Quad PF,6+ Mos 04/10/2015  . Influenza-Unspecified 03/22/2012, 09/03/2014  . Pneumococcal Conjugate-13 12/12/2013  . Pneumococcal Polysaccharide-23 12/14/2014  . Td 10/03/2008  . Tdap 12/07/2018  . Zoster 10/12/2012  . Zoster Recombinat (Shingrix) 09/24/2017, 12/20/2017   Screening Tests Health Maintenance  Topic Date Due  . COLONOSCOPY  03/05/2023  . TETANUS/TDAP  12/06/2028  . INFLUENZA VACCINE  Completed  .  Hepatitis C Screening  Completed  . PNA vac Low Risk Adult  Completed       Plan:    Reviewed  health maintenance screenings with patient today and relevant education, vaccines, and/or referrals were provided.   Continue to eat heart healthy diet (full of fruits, vegetables, whole grains, lean protein, water--limit salt, fat, and sugar intake) and increase physical activity as tolerated.  Continue doing brain stimulating activities (puzzles, reading, adult coloring books, staying active) to keep memory sharp.   I have personally reviewed and noted the following in the patient's chart:   . Medical and social history . Use of alcohol, tobacco or illicit drugs  . Current medications and supplements . Functional ability and status . Nutritional status . Physical activity . Advanced directives . List of other physicians . Screenings to include cognitive, depression, and falls . Referrals and appointments  In addition, I have reviewed and discussed with patient certain preventive protocols, quality metrics, and best practice recommendations. A written personalized care plan for preventive services as well as general preventive health recommendations were provided to patient.     Michiel Cowboy, RN  03/24/2019  Medical screening examination/treatment/procedure(s) were performed by non-physician practitioner and as supervising physician I was immediately available for consultation/collaboration. I agree with above. Cathlean Cower, MD

## 2019-03-24 ENCOUNTER — Ambulatory Visit (INDEPENDENT_AMBULATORY_CARE_PROVIDER_SITE_OTHER): Payer: Medicare Other | Admitting: *Deleted

## 2019-03-24 DIAGNOSIS — Z0000 Encounter for general adult medical examination without abnormal findings: Secondary | ICD-10-CM | POA: Diagnosis not present

## 2019-03-24 NOTE — Telephone Encounter (Signed)
Cecille Rubin requested that the order be corrected. Order reentered.

## 2019-03-24 NOTE — Addendum Note (Signed)
Addended by: Karle Barr on: 03/24/2019 03:16 PM   Modules accepted: Orders

## 2019-03-29 ENCOUNTER — Other Ambulatory Visit: Payer: Self-pay | Admitting: Internal Medicine

## 2019-04-05 ENCOUNTER — Telehealth: Payer: Self-pay | Admitting: Internal Medicine

## 2019-04-06 ENCOUNTER — Ambulatory Visit (HOSPITAL_COMMUNITY)
Admission: RE | Admit: 2019-04-06 | Discharge: 2019-04-06 | Disposition: A | Discharge status: Home / Self Care | Payer: Medicare Other | Source: Ambulatory Visit | Attending: Cardiology | Admitting: Cardiology

## 2019-04-06 ENCOUNTER — Other Ambulatory Visit: Payer: Self-pay

## 2019-04-06 DIAGNOSIS — I6523 Occlusion and stenosis of bilateral carotid arteries: Secondary | ICD-10-CM | POA: Insufficient documentation

## 2019-04-10 NOTE — Telephone Encounter (Signed)
Patient calling and states that the pharmacy is telling him that is insurance will not cover this prescription that was sent on 04/05/2019 because they said he was going through the pills too fast. States that he was told to take 1 pill in the morning and 1 pill at night with his previous prescription because his pressures were elevated in the office. States that that is what he did and now his insurance will not fill another prescription without approval Please advise.

## 2019-04-12 MED ORDER — METOPROLOL TARTRATE 50 MG PO TABS: 50.0000 mg | ORAL_TABLET | Freq: Two times a day (BID) | ORAL | 1 refills | Status: DC

## 2019-04-12 NOTE — Telephone Encounter (Signed)
See 02/21/19 OV- MD advised pt to take Metoprolol 50 mg bid. Updated Rx sent. See meds. Pt informed.

## 2019-04-12 NOTE — Addendum Note (Signed)
Addended by: Cresenciano Lick on: 04/12/2019 10:39 AM   Modules accepted: Orders

## 2019-06-08 ENCOUNTER — Ambulatory Visit (INDEPENDENT_AMBULATORY_CARE_PROVIDER_SITE_OTHER): Payer: Medicare Other | Admitting: Internal Medicine

## 2019-06-08 ENCOUNTER — Other Ambulatory Visit (INDEPENDENT_AMBULATORY_CARE_PROVIDER_SITE_OTHER): Payer: Medicare Other

## 2019-06-08 ENCOUNTER — Other Ambulatory Visit: Payer: Self-pay

## 2019-06-08 ENCOUNTER — Encounter: Payer: Self-pay | Admitting: Internal Medicine

## 2019-06-08 VITALS — BP 124/78 | HR 100 | Temp 98.3°F | Ht 69.0 in | Wt 255.0 lb

## 2019-06-08 DIAGNOSIS — E291 Testicular hypofunction: Secondary | ICD-10-CM

## 2019-06-08 DIAGNOSIS — R7302 Impaired glucose tolerance (oral): Secondary | ICD-10-CM

## 2019-06-08 DIAGNOSIS — J449 Chronic obstructive pulmonary disease, unspecified: Secondary | ICD-10-CM

## 2019-06-08 DIAGNOSIS — I10 Essential (primary) hypertension: Secondary | ICD-10-CM | POA: Diagnosis not present

## 2019-06-08 DIAGNOSIS — E785 Hyperlipidemia, unspecified: Secondary | ICD-10-CM | POA: Diagnosis not present

## 2019-06-08 DIAGNOSIS — Z0000 Encounter for general adult medical examination without abnormal findings: Secondary | ICD-10-CM

## 2019-06-08 LAB — HEMOGLOBIN A1C: Hgb A1c MFr Bld: 6.4 % (ref 4.6–6.5)

## 2019-06-08 LAB — TESTOSTERONE: Testosterone: 80 ng/dL — ABNORMAL LOW (ref 300.00–890.00)

## 2019-06-08 LAB — BASIC METABOLIC PANEL
BUN: 13 mg/dL (ref 6–23)
CO2: 28 mEq/L (ref 19–32)
Calcium: 9.2 mg/dL (ref 8.4–10.5)
Chloride: 106 mEq/L (ref 96–112)
Creatinine, Ser: 1.03 mg/dL (ref 0.40–1.50)
GFR: 86.13 mL/min (ref >60.00)
Glucose, Bld: 132 mg/dL — ABNORMAL HIGH (ref 70–99)
Potassium: 4.1 mEq/L (ref 3.5–5.1)
Sodium: 139 mEq/L (ref 135–145)

## 2019-06-08 LAB — HEPATIC FUNCTION PANEL
ALT: 52 U/L (ref 0–53)
AST: 37 U/L (ref 0–37)
Albumin: 4.1 g/dL (ref 3.5–5.2)
Alkaline Phosphatase: 63 U/L (ref 39–117)
Bilirubin, Direct: 0.1 mg/dL (ref 0.0–0.3)
Total Bilirubin: 0.5 mg/dL (ref 0.2–1.2)
Total Protein: 6.8 g/dL (ref 6.0–8.3)

## 2019-06-08 LAB — LIPID PANEL
Cholesterol: 144 mg/dL (ref 0–200)
HDL: 50.6 mg/dL (ref >39.00)
LDL Cholesterol: 74 mg/dL (ref 0–99)
NonHDL: 93.71
Total CHOL/HDL Ratio: 3
Triglycerides: 98 mg/dL (ref 0.0–149.0)
VLDL: 19.6 mg/dL (ref 0.0–40.0)

## 2019-06-08 NOTE — Progress Notes (Signed)
Subjective:    Patient ID: Warren Weeks, male    DOB: Aug 16, 1945, 73 y.o.   MRN: [id number]  HPI  Here to f/u; overall doing ok,  Pt denies chest pain, increasing sob or doe, wheezing, orthopnea, PND, increased LE swelling, palpitations, dizziness or syncope.  Pt denies new neurological symptoms such as new headache, or facial or extremity weakness or numbness.  Pt denies polydipsia, polyuria, or low sugar episode.  Pt states overall good compliance with meds, mostly trying to follow appropriate diet, with wt overall stable,  but little exercise however.  Pt continues to have recurring LBP without change in severity, bowel or bladder change, fever, wt loss,  worsening LE pain/numbness/weakness, gait change or falls.   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 Past Medical History:  Diagnosis Date  . ALLERGIC RHINITIS 09/16/2007  . ALLERGY 03/24/2007  . Allergy   . ANXIETY 03/28/2007  . Arthritis    knees, back,shoulder  . CAROTID ARTERY STENOSIS, BILATERAL 07/01/2009   yearly checks - no current problems  . CEPHALGIA 06/08/2009  . CHEST PAIN 09/16/2007   no current problems  . COLONIC POLYPS, HX OF 09/16/2007  . COPD (chronic obstructive pulmonary disease) ([place]) 05/12/2017   mild per patient - no inhaler  . Dizziness and giddiness 06/08/2009  . 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 DYSPNEA/SHORTNESS OF BREATH 09/16/2007  . ERECTILE DYSFUNCTION 03/28/2007  . ESOPHAGEAL STRICTURE 09/16/2007  . ESOPHAGITIS 03/24/2007  . GERD 03/24/2007  . GLUCOSE INTOLERANCE 09/16/2007  . HEMORRHOIDS, INTERNAL 03/24/2007  . HYPERCHOLESTEROLEMIA 03/24/2007  . HYPERLIPIDEMIA 09/16/2007  . HYPERTENSION 03/28/2007  . 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 Impaired glucose tolerance 10/04/2010  . LOW BACK PAIN 03/28/2007  . MAGNETIC RESONANCE IMAGING, BRAIN, ABNORMAL 06/26/2009  . NECK PAIN,  LEFT 06/20/2010  . OBESITY 03/24/2007  . OTITIS MEDIA, LEFT 06/10/2009  . Palpitations 06/20/2010   no current problems  . PVD 06/25/2009  . RASH-NONVESICULAR 06/17/2009  . URI 08/16/2009   Past Surgical History:  Procedure Laterality Date  . COLONOSCOPY  03/2013   polyps/[place]  . s/p right knee arthroscopy    . UPPER GASTROINTESTINAL ENDOSCOPY     gerd    reports that he has quit smoking. His smoking use included cigarettes. He has a 7.50 pack-year smoking history. He has never used smokeless tobacco. He reports current alcohol use of about 7.0 standard drinks of alcohol per week. He reports that he does not use drugs. family history includes Coronary artery disease in his brother and sister; Heart disease in his father; Lung cancer in his sister; Lymphoma in his mother; Ovarian cancer in his sister. Allergies  Allergen Reactions  . Cefuroxime Axetil Rash   Current Outpatient Medications on File Prior to Visit  Medication Sig Dispense Refill  . acetaminophen (TYLENOL) 650 MG CR tablet Take 650 mg by mouth every 8 (eight) hours as needed for pain.    Marland Kitchen aspirin (ECOTRIN LOW STRENGTH) 81 MG EC tablet Take 81 mg by mouth daily.      . Cyanocobalamin (B-12 PO) Take 1 tablet by mouth daily.    . diclofenac (VOLTAREN) 75 MG EC tablet Take 1 tablet (75 mg total) by mouth 2 (two) times daily as needed (for pain). 180 tablet 1  . ezetimibe (ZETIA) 10 MG tablet Take 1 tablet (10 mg total)  by mouth daily. 90 tablet 1  . fluticasone (FLONASE) 50 MCG/ACT nasal spray USE 2 SPRAY(S) IN EACH NOSTRIL ONCE DAILY 16 g 5  . hydrochlorothiazide (HYDRODIURIL) 25 MG tablet Take 1 tablet by mouth once daily 90 tablet 0  . lisinopril (ZESTRIL) 20 MG tablet Take 1 tablet by mouth once daily 90 tablet 1  . Melatonin 10 MG TABS Take 10 mg by mouth at bedtime.     . metoprolol tartrate (LOPRESSOR) 50 MG tablet Take 1 tablet (50 mg total) by mouth 2 (two) times daily. 180 tablet 1  . Multiple Vitamin (ONE-A-DAY 55  PLUS PO) Take 1 tablet by mouth daily.    Marland Kitchen omeprazole (PRILOSEC) 20 MG capsule Take 1 capsule by mouth twice daily 180 capsule 1  . rosuvastatin (CRESTOR) 40 MG tablet Take 1 tablet by mouth once daily 90 tablet 1  . sildenafil (VIAGRA) 100 MG tablet Take 1 tablet (100 mg total) by mouth as needed for erectile dysfunction. 10 tablet 11  . terazosin (HYTRIN) 5 MG capsule Take 5 mg by mouth at bedtime.     . thiamine 250 MG tablet Take 250 mg by mouth daily.    Marland Kitchen tiZANidine (ZANAFLEX) 2 MG tablet Take 1 tablet (2 mg total) by mouth 2 (two) times daily as needed for muscle spasms. 30 tablet 0  . triamcinolone cream (KENALOG) 0.1 % 1APPLY  CREAM EXTERNALLY TWICE DAILY 30 g 1  . Turmeric 500 MG TABS Take by mouth daily.     No current facility-administered medications on file prior to visit.   Review of Systems  Constitutional: Negative for other unusual diaphoresis or sweats HENT: Negative for ear discharge or swelling Eyes: Negative for other worsening visual disturbances Respiratory: Negative for stridor or other swelling  Gastrointestinal: Negative for worsening distension or other blood Genitourinary: Negative for retention or other urinary change Musculoskeletal: Negative for other MSK pain or swelling Skin: Negative for color change or other new lesions Neurological: Negative for worsening tremors and other numbness  Psychiatric/Behavioral: Negative for worsening agitation or other fatigue All otherwise neg per pt    Objective:   Physical Exam BP 124/78   Pulse 100   Temp 98.3 F (36.8 C) (Oral)   Ht 5\' 9"  (1.753 m)   Wt 255 lb (115.7 kg)   SpO2 96%   BMI 37.66 kg/m  VS noted,  Constitutional: Pt appears in NAD HENT: Head: NCAT.  Right Ear: External ear normal.  Left Ear: External ear normal.  Eyes: . Pupils are equal, round, and reactive to light. Conjunctivae and EOM are normal Nose: without d/c or deformity Neck: Neck supple. Gross normal ROM Cardiovascular: Normal  rate and regular rhythm.   Pulmonary/Chest: Effort normal and breath sounds without rales or wheezing.  Abd:  Soft, NT, ND, + BS, no organomegaly Neurological: Pt is alert. At baseline orientation, motor grossly intact Skin: Skin is warm. No rashes, other new lesions, no LE edema Psychiatric: Pt behavior is normal without agitation  All otherwise neg per pt Lab Results  Component Value Date   WBC 5.4 12/07/2018   HGB 13.4 12/07/2018   HCT 40.3 12/07/2018   PLT 156.0 12/07/2018   GLUCOSE 132 (H) 06/08/2019   CHOL 144 06/08/2019   TRIG 98.0 06/08/2019   HDL 50.60 06/08/2019   LDLDIRECT 176.0 04/03/2011   LDLCALC 74 06/08/2019   ALT 52 06/08/2019   AST 37 06/08/2019   NA 139 06/08/2019   K 4.1 06/08/2019   CL  106 06/08/2019   CREATININE 1.03 06/08/2019   BUN 13 06/08/2019   CO2 28 06/08/2019   TSH 2.46 12/07/2018   PSA 0.11 12/07/2018   HGBA1C 6.4 06/08/2019      Assessment & Plan:

## 2019-06-08 NOTE — Assessment & Plan Note (Signed)
stable overall by history and exam, recent data reviewed with pt, and pt to continue medical treatment as before,  to f/u any worsening symptoms or concerns  

## 2019-06-08 NOTE — Patient Instructions (Addendum)

## 2019-06-26 DIAGNOSIS — L305 Pityriasis alba: Secondary | ICD-10-CM | POA: Diagnosis not present

## 2019-06-26 DIAGNOSIS — L218 Other seborrheic dermatitis: Secondary | ICD-10-CM | POA: Diagnosis not present

## 2019-06-26 DIAGNOSIS — D225 Melanocytic nevi of trunk: Secondary | ICD-10-CM | POA: Diagnosis not present

## 2019-07-03 ENCOUNTER — Other Ambulatory Visit: Payer: Self-pay | Admitting: Internal Medicine

## 2019-07-04 ENCOUNTER — Other Ambulatory Visit: Payer: Self-pay | Admitting: Internal Medicine

## 2019-07-04 NOTE — Telephone Encounter (Signed)
Please refill as per office routine med refill policy (all routine meds refilled for 3 mo or monthly per pt preference up to one year from last visit, then month to month grace period for 3 mo, then further med refills will have to be denied)  

## 2019-07-27 ENCOUNTER — Other Ambulatory Visit: Payer: Self-pay | Admitting: Internal Medicine

## 2019-07-27 NOTE — Telephone Encounter (Signed)
Please refill as per office routine med refill policy (all routine meds refilled for 3 mo or monthly per pt preference up to one year from last visit, then month to month grace period for 3 mo, then further med refills will have to be denied)  

## 2019-09-05 ENCOUNTER — Telehealth: Payer: Self-pay

## 2019-09-05 ENCOUNTER — Other Ambulatory Visit: Payer: Self-pay

## 2019-09-05 ENCOUNTER — Ambulatory Visit (INDEPENDENT_AMBULATORY_CARE_PROVIDER_SITE_OTHER): Payer: Medicare PPO | Admitting: Orthopaedic Surgery

## 2019-09-05 ENCOUNTER — Encounter: Payer: Self-pay | Admitting: Orthopaedic Surgery

## 2019-09-05 VITALS — Ht 69.0 in | Wt 245.0 lb

## 2019-09-05 DIAGNOSIS — M170 Bilateral primary osteoarthritis of knee: Secondary | ICD-10-CM

## 2019-09-05 MED ORDER — BUPIVACAINE HCL 0.25 % IJ SOLN
0.6600 mL | INTRAMUSCULAR | Status: AC | PRN
  Administered 2019-09-05: .66 mL via INTRA_ARTICULAR

## 2019-09-05 MED ORDER — METHYLPREDNISOLONE ACETATE 40 MG/ML IJ SUSP
13.3300 mg | INTRAMUSCULAR | Status: AC | PRN
  Administered 2019-09-05: 13.33 mg via INTRA_ARTICULAR

## 2019-09-05 MED ORDER — LIDOCAINE HCL 1 % IJ SOLN
3.0000 mL | INTRAMUSCULAR | Status: AC | PRN
  Administered 2019-09-05: 3 mL

## 2019-09-05 MED ORDER — DICLOFENAC SODIUM 75 MG PO TBEC: 75.0000 mg | DELAYED_RELEASE_TABLET | Freq: Two times a day (BID) | ORAL | 2 refills | Status: DC

## 2019-09-05 MED ORDER — METHOCARBAMOL 500 MG PO TABS: 500.0000 mg | ORAL_TABLET | Freq: Two times a day (BID) | ORAL | 1 refills | Status: DC | PRN

## 2019-09-05 NOTE — Progress Notes (Signed)
Office Visit Note   Patient: Warren Weeks           Date of Birth: 01-25-1946           MRN: [id number] Visit Date: 09/05/2019              Requested by: Biagio Borg, MD [place],  [place] [id number] PCP: Biagio Borg, MD   Assessment & Plan: Visit Diagnoses:  1. Bilateral primary osteoarthritis of knee     Plan: Impression is advanced bilateral knee osteoarthritis.  Patient would like to repeat cortisone injections today as well as submit for approval for bilateral knee viscosupplementation injections.  He will follow-up with [place] once he is approved. This patient is diagnosed with osteoarthritis of the knee(s).    Radiographs show evidence of joint space narrowing, osteophytes, subchondral sclerosis and/or subchondral cysts.  This patient has knee pain which interferes with functional and activities of daily living.    This patient has experienced inadequate response, adverse effects and/or intolerance with conservative treatments such as acetaminophen, NSAIDS, topical creams, physical therapy or regular exercise, knee bracing and/or weight loss.   This patient has experienced inadequate response or has a contraindication to intra articular steroid injections for at least 3 months.   This patient is not scheduled to have a total knee replacement within 6 months of starting treatment with viscosupplementation.   Follow-Up Instructions: Return for once approved for visco inj.   Orders:  Orders Placed This Encounter  Procedures  . Large Joint Inj: bilateral knee   No orders of the defined types were placed in this encounter.     Procedures: Large Joint Inj: bilateral knee on 09/05/2019 8:57 AM Indications: pain Details: 22 G needle, anterolateral approach Medications (Right): 0.66 mL bupivacaine 0.25 %; 3 mL lidocaine 1 %; 13.33 mg methylPREDNISolone acetate 40 MG/ML Medications (Left): 0.66 mL bupivacaine 0.25 %; 3 mL lidocaine 1 %; 13.33 mg  methylPREDNISolone acetate 40 MG/ML      Clinical Data: No additional findings.   Subjective: Chief Complaint  Patient presents with  . Right Knee - Pain  . Left Knee - Pain  . Lower Back - Pain    HPI patient is a pleasant 74 year old gentleman who comes in today with recurrent bilateral knee pain left greater than right.  History of advanced degenerative changes to both knees.  We have been seeing him for this for quite some time.  He last had cortisone injections to both knees in June 2020 and viscosupplementation injections to both knees in July 2020.  He noted moderate improvement of symptoms following the cortisone injection, but noted significant provement of symptoms which lasted close to 6 months following the gel injections.  The pain he has now is to the medial aspect of both knees.  His pain is aggravated going from a seated to standing position.  No night pain.  No mechanical symptoms and no pain at rest.  He does take occasional diclofenac which seems to help.  He is currently not ready for total knee arthroplasty.  Review of Systems as detailed in HPI.  All others reviewed and are negative.   Objective: Vital Signs: Ht 5\' 9"  (1.753 m)   Wt 245 lb (111.1 kg)   BMI 36.18 kg/m   Physical Exam well-developed well-nourished gentleman no acute distress.  Alert oriented x3.  Ortho Exam examination of both knees reveals trace effusions.  Range of motion 0 to 120 degrees.  Mild  tenderness medial joint line.  Moderate patellofemoral crepitus.  Ligaments are stable.  He is neurovascular intact distally.  Specialty Comments:  No specialty comments available.  Imaging: No new imaging   PMFS History: Patient Active Problem List   Diagnosis Date Noted  . Hoarseness of voice 12/07/2018  . Urinary leakage 12/07/2018  . Left knee pain 12/07/2018  . Chronic sinusitis 10/05/2018  . 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 COPD (chronic obstructive pulmonary disease) ([place])  05/12/2017  . Pleurisy 03/16/2017  . Bilateral leg pain 11/22/2016  . Insomnia 10/13/2016  . Cough 07/22/2016  . Wheezing 07/22/2016  . Peripheral edema 02/21/2016  . Venous insufficiency 02/05/2016  . Low back pain radiating to lower extremity 02/05/2016  . Vertigo   . Hypersomnolence 10/24/2015  . 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 Chest pain 01/18/2013  . Hematochezia 10/12/2012  . Viral illness 05/26/2012  . Palpitations 01/05/2012  . 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 Fatigue 04/09/2011  . Rash 12/22/2010  . Encounter for long-term (current) use of high-risk medication 10/08/2010  . Impaired glucose tolerance 10/04/2010  . Encounter for well adult exam with abnormal findings 10/04/2010  . Carotid artery stenosis 07/01/2009  . PVD 06/25/2009  . Allergic rhinitis 09/16/2007  . ESOPHAGEAL STRICTURE 09/16/2007  . COLONIC POLYPS, HX OF 09/16/2007  . ANXIETY 03/28/2007  . ERECTILE DYSFUNCTION 03/28/2007  . Hyperlipidemia 03/28/2007  . Essential hypertension 03/28/2007  . LOW BACK PAIN 03/28/2007  . OBESITY 03/24/2007  . HEMORRHOIDS, INTERNAL 03/24/2007  . ESOPHAGITIS 03/24/2007  . GERD 03/24/2007   Past Medical History:  Diagnosis Date  . ALLERGIC RHINITIS 09/16/2007  . ALLERGY 03/24/2007  . Allergy   . ANXIETY 03/28/2007  . Arthritis    knees, back,shoulder  . CAROTID ARTERY STENOSIS, BILATERAL 07/01/2009   yearly checks - no current problems  . CEPHALGIA 06/08/2009  . CHEST PAIN 09/16/2007   no current problems  . COLONIC POLYPS, HX OF 09/16/2007  . COPD (chronic obstructive pulmonary disease) ([place]) 05/12/2017   mild per patient - no inhaler  . Dizziness and giddiness 06/08/2009  . 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 DYSPNEA/SHORTNESS OF BREATH 09/16/2007  . ERECTILE DYSFUNCTION 03/28/2007  . ESOPHAGEAL STRICTURE 09/16/2007  . ESOPHAGITIS 03/24/2007  . GERD 03/24/2007  . GLUCOSE INTOLERANCE 09/16/2007  . HEMORRHOIDS, INTERNAL 03/24/2007  . HYPERCHOLESTEROLEMIA  03/24/2007  . HYPERLIPIDEMIA 09/16/2007  . HYPERTENSION 03/28/2007  . 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 Impaired glucose tolerance 10/04/2010  . LOW BACK PAIN 03/28/2007  . MAGNETIC RESONANCE IMAGING, BRAIN, ABNORMAL 06/26/2009  . NECK PAIN, LEFT 06/20/2010  . OBESITY 03/24/2007  . OTITIS MEDIA, LEFT 06/10/2009  . Palpitations 06/20/2010   no current problems  . PVD 06/25/2009  . RASH-NONVESICULAR 06/17/2009  . URI 08/16/2009    Family History  Problem Relation Age of Onset  . Lymphoma Mother   . Heart disease Father        CHF  . Ovarian cancer Sister   . Lung cancer Sister   . Coronary artery disease Sister        CABG  . Coronary artery disease Brother        stent, and CAD  . Colon cancer Neg Hx   . Esophageal cancer Neg Hx   . Rectal cancer Neg Hx   . Stomach cancer Neg Hx   . Colon polyps Neg Hx     Past Surgical History:  Procedure Laterality Date  . COLONOSCOPY  03/2013   polyps/[place]  . s/p right knee arthroscopy    .  UPPER GASTROINTESTINAL ENDOSCOPY     gerd   Social History   Occupational History  . Occupation: ship and Physiological scientist: [employer]  Tobacco Use  . Smoking status: Former Smoker    Packs/day: 0.50    Years: 15.00    Pack years: 7.50    Types: Cigarettes  . Smokeless tobacco: Never Used  . Tobacco comment: Quit 30 years ago  Substance and Sexual Activity  . Alcohol use: Yes    Alcohol/week: 7.0 standard drinks    Types: 7 Cans of beer per week    Comment: beer most days  . Drug use: No  . Sexual activity: Yes

## 2019-09-05 NOTE — Telephone Encounter (Signed)
Submitted for VOB for Monovisc-Bilateral knee  

## 2019-09-05 NOTE — Telephone Encounter (Signed)
Please precert for bilateral visco injections. This is Dr.Xu's patient. Thanks!  

## 2019-09-05 NOTE — Telephone Encounter (Signed)
Approved for Monovisc-Bilateral knee Dr. Frederik Pear and Bill $40 copay Once OOP met pt is covered @ 100% Prior auth required with [employer]here [employer] auth # [id number] Dates:09/05/19-03/07/20

## 2019-09-06 NOTE — Telephone Encounter (Signed)
LVM for pt to call back to schedule. Ok to schedule at next available 

## 2019-09-07 NOTE — Telephone Encounter (Signed)
Left another voice mail

## 2019-09-07 NOTE — Telephone Encounter (Signed)
Per chart pt called back and got an appointment for next week and was informed about copay

## 2019-09-12 ENCOUNTER — Ambulatory Visit: Payer: Medicare PPO | Admitting: Orthopaedic Surgery

## 2019-09-12 ENCOUNTER — Other Ambulatory Visit: Payer: Self-pay

## 2019-09-12 ENCOUNTER — Encounter: Payer: Self-pay | Admitting: Orthopaedic Surgery

## 2019-09-12 DIAGNOSIS — M170 Bilateral primary osteoarthritis of knee: Secondary | ICD-10-CM

## 2019-09-12 MED ORDER — LIDOCAINE HCL 1 % IJ SOLN
3.0000 mL | INTRAMUSCULAR | Status: AC | PRN
  Administered 2019-09-12: 15:00:00 3 mL

## 2019-09-12 MED ORDER — BUPIVACAINE HCL 0.25 % IJ SOLN
0.6600 mL | INTRAMUSCULAR | Status: AC | PRN
  Administered 2019-09-12: .66 mL via INTRA_ARTICULAR

## 2019-09-12 MED ORDER — LIDOCAINE HCL 1 % IJ SOLN
3.0000 mL | INTRAMUSCULAR | Status: AC | PRN
  Administered 2019-09-12: 3 mL

## 2019-09-12 MED ORDER — HYALURONAN 88 MG/4ML IX SOSY
88.0000 mg | PREFILLED_SYRINGE | INTRA_ARTICULAR | Status: AC | PRN
  Administered 2019-09-12: 88 mg via INTRA_ARTICULAR

## 2019-09-12 NOTE — Progress Notes (Signed)
   Procedure Note  Patient: Warren Weeks             Date of Birth: 01/10/1946           MRN: [id number]             Visit Date: 09/12/2019  Procedures: Visit Diagnoses:  1. Bilateral primary osteoarthritis of knee     Large Joint Inj: bilateral knee on 09/12/2019 3:06 PM Indications: pain Details: 22 G needle, anterolateral approach Medications (Right): 0.66 mL bupivacaine 0.25 %; 3 mL lidocaine 1 %; 88 mg Hyaluronan 88 MG/4ML Medications (Left): 0.66 mL bupivacaine 0.25 %; 3 mL lidocaine 1 %; 88 mg Hyaluronan 88 MG/4ML

## 2019-09-14 ENCOUNTER — Ambulatory Visit: Payer: Medicare PPO | Admitting: Internal Medicine

## 2019-09-14 ENCOUNTER — Other Ambulatory Visit: Payer: Self-pay

## 2019-09-14 ENCOUNTER — Encounter: Payer: Self-pay | Admitting: Internal Medicine

## 2019-09-14 VITALS — BP 150/90 | HR 82 | Temp 97.8°F | Ht 69.0 in | Wt 254.8 lb

## 2019-09-14 DIAGNOSIS — R350 Frequency of micturition: Secondary | ICD-10-CM | POA: Diagnosis not present

## 2019-09-14 DIAGNOSIS — R600 Localized edema: Secondary | ICD-10-CM | POA: Diagnosis not present

## 2019-09-14 DIAGNOSIS — R7302 Impaired glucose tolerance (oral): Secondary | ICD-10-CM

## 2019-09-14 DIAGNOSIS — E611 Iron deficiency: Secondary | ICD-10-CM | POA: Diagnosis not present

## 2019-09-14 DIAGNOSIS — J449 Chronic obstructive pulmonary disease, unspecified: Secondary | ICD-10-CM

## 2019-09-14 DIAGNOSIS — E538 Deficiency of other specified B group vitamins: Secondary | ICD-10-CM

## 2019-09-14 DIAGNOSIS — I10 Essential (primary) hypertension: Secondary | ICD-10-CM | POA: Diagnosis not present

## 2019-09-14 DIAGNOSIS — I5189 Other ill-defined heart diseases: Secondary | ICD-10-CM | POA: Diagnosis not present

## 2019-09-14 DIAGNOSIS — R609 Edema, unspecified: Secondary | ICD-10-CM

## 2019-09-14 DIAGNOSIS — E559 Vitamin D deficiency, unspecified: Secondary | ICD-10-CM | POA: Diagnosis not present

## 2019-09-14 DIAGNOSIS — Z0001 Encounter for general adult medical examination with abnormal findings: Secondary | ICD-10-CM | POA: Diagnosis not present

## 2019-09-14 NOTE — Assessment & Plan Note (Signed)

## 2019-09-14 NOTE — Assessment & Plan Note (Signed)
stable overall by history and exam, recent data reviewed with pt, and pt to continue medical treatment as before,  to f/u any worsening symptoms or concerns  

## 2019-09-14 NOTE — Assessment & Plan Note (Signed)
Cont same tx, declines change in tx

## 2019-09-14 NOTE — Assessment & Plan Note (Addendum)
Plans to f/u with urology  I spent 31 minutes in preparing to see the patient by review of recent labs, imaging and procedures, obtaining and reviewing separately obtained history, communicating with the patient and family or caregiver, ordering medications, tests or procedures, and documenting clinical information in the EHR including the differential Dx, treatment, and any further evaluation and other management of urinary frequency, diastolic dysfxn/leg edema, copd, hyperglycemia, hTN,

## 2019-09-14 NOTE — Assessment & Plan Note (Signed)
D/w pt, declines change of hct to lasix due to aggrevation of urinary frequency

## 2019-09-14 NOTE — Progress Notes (Signed)
Subjective:    Patient ID: Warren Weeks, male    DOB: 1946/06/09, 74 y.o.   MRN: [id number]  HPI  Here for wellness and f/u;  Overall doing ok;  Pt denies Chest pain, worsening SOB, DOE, wheezing, orthopnea, PND, palpitations, dizziness or syncope but has has persistent mild bilateral LE leg swelling   Pt denies neurological change such as new headache, facial or extremity weakness.  Pt denies polydipsia, polyuria, or low sugar symptoms. Pt states overall good compliance with treatment and medications, good tolerability, and has been trying to follow appropriate diet.  Pt denies worsening depressive symptoms, suicidal ideation or panic. No fever, night sweats, wt loss, loss of appetite, or other constitutional symptoms.  Pt states good ability with ADL's, has low fall risk, home safety reviewed and adequate, no other significant changes in hearing or vision, and only occasionally active with exercise.  Denies urinary symptoms such as dysuria, urgency, flank pain, hematuria or n/v, fever, chills, but still has significant frequency and nocturia S/p gel shots per ortho to knees.   Past Medical History:  Diagnosis Date  . ALLERGIC RHINITIS 09/16/2007  . ALLERGY 03/24/2007  . Allergy   . ANXIETY 03/28/2007  . Arthritis    knees, back,shoulder  . CAROTID ARTERY STENOSIS, BILATERAL 07/01/2009   yearly checks - no current problems  . CEPHALGIA 06/08/2009  . CHEST PAIN 09/16/2007   no current problems  . COLONIC POLYPS, HX OF 09/16/2007  . COPD (chronic obstructive pulmonary disease) ([place]) 05/12/2017   mild per patient - no inhaler  . Dizziness and giddiness 06/08/2009  . 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 DYSPNEA/SHORTNESS OF BREATH 09/16/2007  . ERECTILE DYSFUNCTION 03/28/2007  . ESOPHAGEAL STRICTURE 09/16/2007  . ESOPHAGITIS 03/24/2007  . GERD 03/24/2007  . GLUCOSE INTOLERANCE 09/16/2007  . HEMORRHOIDS, INTERNAL 03/24/2007  . HYPERCHOLESTEROLEMIA 03/24/2007  . HYPERLIPIDEMIA 09/16/2007  .  HYPERTENSION 03/28/2007  . 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 Impaired glucose tolerance 10/04/2010  . LOW BACK PAIN 03/28/2007  . MAGNETIC RESONANCE IMAGING, BRAIN, ABNORMAL 06/26/2009  . NECK PAIN, LEFT 06/20/2010  . OBESITY 03/24/2007  . OTITIS MEDIA, LEFT 06/10/2009  . Palpitations 06/20/2010   no current problems  . PVD 06/25/2009  . RASH-NONVESICULAR 06/17/2009  . URI 08/16/2009   Past Surgical History:  Procedure Laterality Date  . COLONOSCOPY  03/2013   polyps/[place]  . s/p right knee arthroscopy    . UPPER GASTROINTESTINAL ENDOSCOPY     gerd    reports that he has quit smoking. His smoking use included cigarettes. He has a 7.50 pack-year smoking history. He has never used smokeless tobacco. He reports current alcohol use of about 7.0 standard drinks of alcohol per week. He reports that he does not use drugs. family history includes Coronary artery disease in his brother and sister; Heart disease in his father; Lung cancer in his sister; Lymphoma in his mother; Ovarian cancer in his sister. Allergies  Allergen Reactions  . Cefuroxime Axetil Rash   Current Outpatient Medications on File Prior to Visit  Medication Sig Dispense Refill  . acetaminophen (TYLENOL) 650 MG CR tablet Take 650 mg by mouth every 8 (eight) hours as needed for pain.    Marland Kitchen aspirin (ECOTRIN LOW STRENGTH) 81 MG EC tablet Take 81 mg by mouth daily.      . Cyanocobalamin (B-12 PO) Take 1 tablet by mouth daily.    . diclofenac (VOLTAREN) 75 MG EC tablet Take 1 tablet (75 mg total) by mouth 2 (two)  times daily. 60 tablet 2  . ezetimibe (ZETIA) 10 MG tablet Take 1 tablet by mouth once daily 90 tablet 1  . fluticasone (FLONASE) 50 MCG/ACT nasal spray USE 2 SPRAY(S) IN EACH NOSTRIL ONCE DAILY 16 g 5  . hydrochlorothiazide (HYDRODIURIL) 25 MG tablet Take 1 tablet by mouth once daily 90 tablet 0  . lisinopril (ZESTRIL) 20 MG tablet Take 1 tablet by mouth once daily 90 tablet 1  . Melatonin 10 MG TABS Take 10 mg by mouth at  bedtime.     . methocarbamol (ROBAXIN) 500 MG tablet Take 1 tablet (500 mg total) by mouth 2 (two) times daily as needed. 30 tablet 1  . metoprolol tartrate (LOPRESSOR) 50 MG tablet Take 1 tablet (50 mg total) by mouth 2 (two) times daily. 180 tablet 1  . Multiple Vitamin (ONE-A-DAY 55 PLUS PO) Take 1 tablet by mouth daily.    Marland Kitchen omeprazole (PRILOSEC) 20 MG capsule Take 1 capsule by mouth twice daily 180 capsule 1  . rosuvastatin (CRESTOR) 40 MG tablet Take 1 tablet by mouth once daily 90 tablet 1  . sildenafil (VIAGRA) 100 MG tablet Take 1 tablet (100 mg total) by mouth as needed for erectile dysfunction. 10 tablet 11  . terazosin (HYTRIN) 5 MG capsule Take 5 mg by mouth at bedtime.     . thiamine 250 MG tablet Take 250 mg by mouth daily.    Marland Kitchen triamcinolone cream (KENALOG) 0.1 % 1APPLY  CREAM EXTERNALLY TWICE DAILY 30 g 1  . Turmeric 500 MG TABS Take by mouth daily.    Marland Kitchen tiZANidine (ZANAFLEX) 2 MG tablet Take 1 tablet (2 mg total) by mouth 2 (two) times daily as needed for muscle spasms. (Patient not taking: Reported on 09/14/2019) 30 tablet 0   No current facility-administered medications on file prior to visit.   Review of Systems All otherwise neg per pt     Objective:   Physical Exam BP (!) 150/90   Pulse 82   Temp 97.8 F (36.6 C)   Ht 5\' 9"  (1.753 m)   Wt 254 lb 12.8 oz (115.6 kg)   SpO2 100%   BMI 37.63 kg/m  VS noted,  Constitutional: Pt appears in NAD HENT: Head: NCAT.  Right Ear: External ear normal.  Left Ear: External ear normal.  Eyes: . Pupils are equal, round, and reactive to light. Conjunctivae and EOM are normal Nose: without d/c or deformity Neck: Neck supple. Gross normal ROM Cardiovascular: Normal rate and regular rhythm.   Pulmonary/Chest: Effort normal and breath sounds without rales or wheezing.  Abd:  Soft, NT, ND, + BS, no organomegaly Neurological: Pt is alert. At baseline orientation, motor grossly intact Skin: Skin is warm. No rashes, other new  lesions, trace to 1+ bilat LE edema Psychiatric: Pt behavior is normal without agitation  All otherwise neg per pt Lab Results  Component Value Date   WBC 5.4 12/07/2018   HGB 13.4 12/07/2018   HCT 40.3 12/07/2018   PLT 156.0 12/07/2018   GLUCOSE 132 (H) 06/08/2019   CHOL 144 06/08/2019   TRIG 98.0 06/08/2019   HDL 50.60 06/08/2019   LDLDIRECT 176.0 04/03/2011   LDLCALC 74 06/08/2019   ALT 52 06/08/2019   AST 37 06/08/2019   NA 139 06/08/2019   K 4.1 06/08/2019   CL 106 06/08/2019   CREATININE 1.03 06/08/2019   BUN 13 06/08/2019   CO2 28 06/08/2019   TSH 2.46 12/07/2018   PSA 0.11 12/07/2018  HGBA1C 6.4 06/08/2019      Assessment & Plan:

## 2019-09-14 NOTE — Patient Instructions (Signed)
Please continue all other medications as before, and refills have been done if requested.  Please have the pharmacy call with any other refills you may need.  Please continue your efforts at being more active, low cholesterol diet, and weight control.  You are otherwise up to date with prevention measures today.  Please keep your appointments with your specialists as you may have planned - UROLOGY  Please go to the LAB at the blood drawing area for the tests to be done  You will be contacted by phone if any changes need to be made immediately.  Otherwise, you will receive a letter about your results with an explanation, but please check with MyChart first.  Please remember to sign up for MyChart if you have not done so, as this will be important to you in the future with finding out test results, communicating by private email, and scheduling acute appointments online when needed.  Please make an Appointment to return in 6 months, or sooner if needed

## 2019-09-15 LAB — CBC WITH DIFFERENTIAL/PLATELET
Basophils Absolute: 0.1 10*3/uL (ref 0.0–0.1)
Basophils Relative: 1 % (ref 0.0–3.0)
Eosinophils Absolute: 0.1 10*3/uL (ref 0.0–0.7)
Eosinophils Relative: 1.2 % (ref 0.0–5.0)
HCT: 41.8 % (ref 39.0–52.0)
Hemoglobin: 13.9 g/dL (ref 13.0–17.0)
Lymphocytes Relative: 28.4 % (ref 12.0–46.0)
Lymphs Abs: 1.6 10*3/uL (ref 0.7–4.0)
MCHC: 33.4 g/dL (ref 30.0–36.0)
MCV: 90.7 fl (ref 78.0–100.0)
Monocytes Absolute: 0.5 10*3/uL (ref 0.1–1.0)
Monocytes Relative: 9.6 % (ref 3.0–12.0)
Neutro Abs: 3.4 10*3/uL (ref 1.4–7.7)
Neutrophils Relative %: 59.8 % (ref 43.0–77.0)
Platelets: 147 10*3/uL — ABNORMAL LOW (ref 150.0–400.0)
RBC: 4.61 Mil/uL (ref 4.22–5.81)
RDW: 14 % (ref 11.5–15.5)
WBC: 5.7 10*3/uL (ref 4.0–10.5)

## 2019-09-15 LAB — URINALYSIS, ROUTINE W REFLEX MICROSCOPIC
Bilirubin Urine: NEGATIVE
Hgb urine dipstick: NEGATIVE
Ketones, ur: NEGATIVE
Leukocytes,Ua: NEGATIVE
Nitrite: NEGATIVE
Specific Gravity, Urine: 1.025 (ref 1.000–1.030)
Total Protein, Urine: NEGATIVE
Urine Glucose: NEGATIVE
Urobilinogen, UA: 0.2 (ref 0.0–1.0)
pH: 5.5 (ref 5.0–8.0)

## 2019-09-15 LAB — HEPATIC FUNCTION PANEL
ALT: 47 U/L (ref 0–53)
AST: 32 U/L (ref 0–37)
Albumin: 4.4 g/dL (ref 3.5–5.2)
Alkaline Phosphatase: 74 U/L (ref 39–117)
Bilirubin, Direct: 0.1 mg/dL (ref 0.0–0.3)
Total Bilirubin: 0.4 mg/dL (ref 0.2–1.2)
Total Protein: 6.9 g/dL (ref 6.0–8.3)

## 2019-09-15 LAB — BASIC METABOLIC PANEL
BUN: 19 mg/dL (ref 6–23)
CO2: 28 mEq/L (ref 19–32)
Calcium: 9.5 mg/dL (ref 8.4–10.5)
Chloride: 107 mEq/L (ref 96–112)
Creatinine, Ser: 1.03 mg/dL (ref 0.40–1.50)
GFR: 86.07 mL/min (ref >60.00)
Glucose, Bld: 101 mg/dL — ABNORMAL HIGH (ref 70–99)
Potassium: 4.5 mEq/L (ref 3.5–5.1)
Sodium: 139 mEq/L (ref 135–145)

## 2019-09-15 LAB — LIPID PANEL
Cholesterol: 157 mg/dL (ref 0–200)
HDL: 54.4 mg/dL (ref >39.00)
LDL Cholesterol: 78 mg/dL (ref 0–99)
NonHDL: 102.39
Total CHOL/HDL Ratio: 3
Triglycerides: 122 mg/dL (ref 0.0–149.0)
VLDL: 24.4 mg/dL (ref 0.0–40.0)

## 2019-09-15 LAB — PSA: PSA: 0.13 ng/mL (ref 0.10–4.00)

## 2019-09-15 LAB — IBC PANEL
Iron: 78 ug/dL (ref 42–165)
Saturation Ratios: 24.1 % (ref 20.0–50.0)
Transferrin: 231 mg/dL (ref 212.0–360.0)

## 2019-09-15 LAB — VITAMIN B12: Vitamin B-12: 596 pg/mL (ref 211–911)

## 2019-09-15 LAB — MICROALBUMIN / CREATININE URINE RATIO
Creatinine,U: 129.1 mg/dL
Microalb Creat Ratio: 0.5 mg/g (ref 0.0–30.0)
Microalb, Ur: <0.7 mg/dL (ref 0.0–1.9)

## 2019-09-15 LAB — TSH: TSH: 1.69 u[IU]/mL (ref 0.35–4.50)

## 2019-09-15 LAB — HEMOGLOBIN A1C: Hgb A1c MFr Bld: 6.6 % — ABNORMAL HIGH (ref 4.6–6.5)

## 2019-09-15 LAB — VITAMIN D 25 HYDROXY (VIT D DEFICIENCY, FRACTURES): VITD: 36.2 ng/mL (ref 30.00–100.00)

## 2019-09-15 LAB — BRAIN NATRIURETIC PEPTIDE: Pro B Natriuretic peptide (BNP): 75 pg/mL (ref 0.0–100.0)

## 2019-09-15 NOTE — Addendum Note (Signed)
Addended by: Trenda Moots on: [id number] 10:25 AM   Modules accepted: Orders

## 2019-10-08 ENCOUNTER — Ambulatory Visit
Admission: EM | Admit: 2019-10-08 | Discharge: 2019-10-08 | Disposition: A | Discharge status: Home / Self Care | Payer: Medicare PPO

## 2019-10-08 ENCOUNTER — Encounter: Payer: Self-pay | Admitting: Physician Assistant

## 2019-10-08 ENCOUNTER — Other Ambulatory Visit: Payer: Self-pay

## 2019-10-08 DIAGNOSIS — R0781 Pleurodynia: Secondary | ICD-10-CM | POA: Diagnosis not present

## 2019-10-08 DIAGNOSIS — W19XXXA Unspecified fall, initial encounter: Secondary | ICD-10-CM

## 2019-10-08 DIAGNOSIS — W010XXA Fall on same level from slipping, tripping and stumbling without subsequent striking against object, initial encounter: Secondary | ICD-10-CM | POA: Diagnosis not present

## 2019-10-08 NOTE — Discharge Instructions (Signed)
No alarming signs on exam. Continue diclofenac as directed. Restart tizanidine as needed. Ice compress to the area for the first 2-3 days, then switch to heat. This may take a few weeks to completely resolve, but should be feeling better each week. Follow up with PCP for recheck. If having sudden chest pain, shortness of breath, go to the emergency for further evaluation.

## 2019-10-08 NOTE — ED Triage Notes (Signed)
Pt fell coming down the steps yesterday evening, he missed the last step.  He left side hurts.  Elbow and hurts when he breathes on the left side.

## 2019-10-08 NOTE — ED Provider Notes (Signed)
[facility]    CSN: [id number] Arrival date & time: 10/08/19  1511      History   Chief Complaint Chief Complaint  Patient presents with  . Fall    HPI Warren Weeks is a 74 y.o. male.   74 year old male comes in for left side pain after falling last night. States slipped and fell, landing on his left torso. Denies head injury, loss of consciousness. Was able to ambulate on own without incident. Has abrasion to the left elbow, but denies significant pain, swelling, decreased ROM. States left sided rib pain when taking deep breaths. Denies shortness of breath. Denies pain at rest. Takes diclofenac at baseline. No blood thinner use.      Past Medical History:  Diagnosis Date  . ALLERGIC RHINITIS 09/16/2007  . ALLERGY 03/24/2007  . Allergy   . ANXIETY 03/28/2007  . Arthritis    knees, back,shoulder  . CAROTID ARTERY STENOSIS, BILATERAL 07/01/2009   yearly checks - no current problems  . CEPHALGIA 06/08/2009  . CHEST PAIN 09/16/2007   no current problems  . COLONIC POLYPS, HX OF 09/16/2007  . COPD (chronic obstructive pulmonary disease) ([place]) 05/12/2017   mild per patient - no inhaler  . Dizziness and giddiness 06/08/2009  . 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 DYSPNEA/SHORTNESS OF BREATH 09/16/2007  . ERECTILE DYSFUNCTION 03/28/2007  . ESOPHAGEAL STRICTURE 09/16/2007  . ESOPHAGITIS 03/24/2007  . GERD 03/24/2007  . GLUCOSE INTOLERANCE 09/16/2007  . HEMORRHOIDS, INTERNAL 03/24/2007  . HYPERCHOLESTEROLEMIA 03/24/2007  . HYPERLIPIDEMIA 09/16/2007  . HYPERTENSION 03/28/2007  . 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 Impaired glucose tolerance 10/04/2010  . LOW BACK PAIN 03/28/2007  . MAGNETIC RESONANCE IMAGING, BRAIN, ABNORMAL 06/26/2009  . NECK PAIN, LEFT 06/20/2010  . OBESITY 03/24/2007  . OTITIS MEDIA, LEFT 06/10/2009  . Palpitations 06/20/2010   no current problems  . PVD 06/25/2009  . RASH-NONVESICULAR 06/17/2009  . URI 08/16/2009    Patient Active Problem List   Diagnosis  Date Noted  . Diastolic dysfunction [id number]  . Urinary frequency 09/14/2019  . Hoarseness of voice 12/07/2018  . Urinary leakage 12/07/2018  . Left knee pain 12/07/2018  . Chronic sinusitis 10/05/2018  . 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 COPD (chronic obstructive pulmonary disease) ([place]) 05/12/2017  . Pleurisy 03/16/2017  . Bilateral leg pain 11/22/2016  . Insomnia 10/13/2016  . Cough 07/22/2016  . Wheezing 07/22/2016  . Peripheral edema 02/21/2016  . Venous insufficiency 02/05/2016  . Low back pain radiating to lower extremity 02/05/2016  . Vertigo   . Hypersomnolence 10/24/2015  . 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 Chest pain 01/18/2013  . Hematochezia 10/12/2012  . Viral illness 05/26/2012  . Palpitations 01/05/2012  . 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 Fatigue 04/09/2011  . Rash 12/22/2010  . Encounter for long-term (current) use of high-risk medication 10/08/2010  . Impaired glucose tolerance 10/04/2010  . Encounter for well adult exam with abnormal findings 10/04/2010  . Carotid artery stenosis 07/01/2009  . PVD 06/25/2009  . Allergic rhinitis 09/16/2007  . ESOPHAGEAL STRICTURE 09/16/2007  . COLONIC POLYPS, HX OF 09/16/2007  . ANXIETY 03/28/2007  . ERECTILE DYSFUNCTION 03/28/2007  . Hyperlipidemia 03/28/2007  . Essential hypertension 03/28/2007  . LOW BACK PAIN 03/28/2007  . OBESITY 03/24/2007  . HEMORRHOIDS, INTERNAL 03/24/2007  . ESOPHAGITIS 03/24/2007  . GERD 03/24/2007    Past Surgical History:  Procedure Laterality Date  . COLONOSCOPY  03/2013   polyps/[place]  . s/p right knee arthroscopy    .  UPPER GASTROINTESTINAL ENDOSCOPY     gerd       Home Medications    Prior to Admission medications   Medication Sig Start Date End Date Taking? Authorizing Provider  acetaminophen (TYLENOL) 650 MG CR tablet Take 650 mg by mouth every 8 (eight) hours as needed for pain.    [provider]  aspirin (ECOTRIN LOW STRENGTH) 81 MG EC tablet Take 81  mg by mouth daily.      [provider]  Cyanocobalamin (B-12 PO) Take 1 tablet by mouth daily.    [provider]  diclofenac (VOLTAREN) 75 MG EC tablet Take 1 tablet (75 mg total) by mouth 2 (two) times daily. 09/05/19   Aundra Dubin, PA-C  ezetimibe (ZETIA) 10 MG tablet Take 1 tablet by mouth once daily 07/03/19   Biagio Borg, MD  fluticasone [facility]) 50 MCG/ACT nasal spray USE 2 SPRAY(S) IN [facility] NOSTRIL ONCE DAILY 12/07/18   Biagio Borg, MD  hydrochlorothiazide (HYDRODIURIL) 25 MG tablet Take 1 tablet by mouth once daily 03/29/19   Biagio Borg, MD  lisinopril (ZESTRIL) 20 MG tablet Take 1 tablet by mouth once daily 07/04/19   Biagio Borg, MD  Melatonin 10 MG TABS Take 10 mg by mouth at bedtime.     [provider]  methocarbamol (ROBAXIN) 500 MG tablet Take 1 tablet (500 mg total) by mouth 2 (two) times daily as needed. 09/05/19   Aundra Dubin, PA-C  metoprolol tartrate (LOPRESSOR) 50 MG tablet Take 1 tablet (50 mg total) by mouth 2 (two) times daily. 04/12/19   Biagio Borg, MD  Multiple Vitamin (ONE-A-DAY 55 PLUS PO) Take 1 tablet by mouth daily.    [provider]  omeprazole (PRILOSEC) 20 MG capsule Take 1 capsule by mouth twice daily 03/06/19   Biagio Borg, MD  rosuvastatin (CRESTOR) 40 MG tablet Take 1 tablet by mouth once daily 07/28/19   Biagio Borg, MD  sildenafil (VIAGRA) 100 MG tablet Take 1 tablet (100 mg total) by mouth as needed for erectile dysfunction. 12/20/12   Biagio Borg, MD  terazosin (HYTRIN) 5 MG capsule Take 5 mg by mouth at bedtime.  03/22/15   [provider]  thiamine 250 MG tablet Take 250 mg by mouth daily.    [provider]  tiZANidine (ZANAFLEX) 2 MG tablet Take 1 tablet (2 mg total) by mouth 2 (two) times daily as needed for muscle spasms. Patient not taking: Reported on 09/14/2019 03/15/19   Aundra Dubin, PA-C  triamcinolone cream (KENALOG) 0.1 % 1APPLY  CREAM EXTERNALLY TWICE DAILY 12/07/18    Biagio Borg, MD  Turmeric 500 MG TABS Take by mouth daily.    [provider]    Family History Family History  Problem Relation Age of Onset  . Lymphoma Mother   . Heart disease Father        CHF  . Ovarian cancer Sister   . Lung cancer Sister   . Coronary artery disease Sister        CABG  . Coronary artery disease Brother        stent, and CAD  . Colon cancer Neg Hx   . Esophageal cancer Neg Hx   . Rectal cancer Neg Hx   . Stomach cancer Neg Hx   . Colon polyps Neg Hx     Social History Social History   Tobacco Use  . Smoking status: Former Smoker  Packs/day: 0.50    Years: 15.00    Pack years: 7.50    Types: Cigarettes  . Smokeless tobacco: Never Used  . Tobacco comment: Quit 30 years ago  Substance Use Topics  . Alcohol use: Yes    Alcohol/week: 7.0 standard drinks    Types: 7 Cans of beer per week    Comment: beer most days  . Drug use: No     Allergies   Cefuroxime axetil   Review of Systems Review of Systems  Reason unable to perform ROS: See HPI as above.     Physical Exam Triage Vital Signs ED Triage Vitals [10/08/19 1519]  Enc Vitals Group     BP (!) 161/84     Pulse Rate 62     Resp 16     Temp 98 F (36.7 C)     Temp Source Oral     SpO2 93 %     Weight      Height      Head Circumference      Peak Flow      Pain Score 6     Pain Loc      Pain Edu?      Excl. in [place]?    No data found.  Updated Vital Signs BP (!) 161/84 (BP Location: Left Arm)   Pulse 62   Temp 98 F (36.7 C) (Oral)   Resp 16   SpO2 93%   Visual Acuity Right Eye Distance:   Left Eye Distance:   Bilateral Distance:    Right Eye Near:   Left Eye Near:    Bilateral Near:     Physical Exam Constitutional:      General: He is not in acute distress.    Appearance: Normal appearance. He is well-developed. He is not toxic-appearing or diaphoretic.  HENT:     Head: Normocephalic and atraumatic.  Eyes:     Conjunctiva/sclera: Conjunctivae  normal.     Pupils: Pupils are equal, round, and reactive to light.  Cardiovascular:     Rate and Rhythm: Normal rate and regular rhythm.  Pulmonary:     Effort: Pulmonary effort is normal. No respiratory distress.     Comments: Speaking in full sentences without difficulty. LCTAB Chest:     Comments: No swelling, contusion, deformity seen. Diffuse tenderness to palpation of left anterior/lateral lower ribs. No crepitus. No focal tenderness.  Musculoskeletal:     Cervical back: Normal range of motion and neck supple.     Comments: No tenderness to thoracic back  Skin:    General: Skin is warm and dry.  Neurological:     Mental Status: He is alert and oriented to person, place, and time.      UC Treatments / Results  Labs (all labs ordered are listed, but only abnormal results are displayed) Labs Reviewed - No data to display  EKG   Radiology No results found.  Procedures Procedures (including critical care time)  Medications Ordered in UC Medications - No data to display  Initial Impression / Assessment and Plan / UC Course  I have reviewed the triage vital signs and the nursing notes.  Pertinent labs & imaging results that were available during my care of the patient were reviewed by me and considered in my medical decision making (see chart for details).    Discussed low suspicion for rib fractures, though offered xray due to injury. Patient would like to defer xray at this time. Patient already  on diclofenac. Offered toradol injection, for which patient deferred. Has Rx of tizanidine, will have patient restart for current symptoms. Return precautions given. Patient expresses understanding and agrees to plan.  Final Clinical Impressions(s) / UC Diagnoses   Final diagnoses:  Rib pain on left side  Fall, initial encounter    ED Prescriptions    None     PDMP not reviewed this encounter.   Ok Edwards, PA-C 10/08/19 [phone]

## 2019-10-20 ENCOUNTER — Other Ambulatory Visit: Payer: Self-pay | Admitting: Internal Medicine

## 2019-11-10 ENCOUNTER — Other Ambulatory Visit: Payer: Self-pay | Admitting: Internal Medicine

## 2019-11-10 NOTE — Telephone Encounter (Signed)
Please refill as per office routine med refill policy (all routine meds refilled for 3 mo or monthly per pt preference up to one year from last visit, then month to month grace period for 3 mo, then further med refills will have to be denied)  

## 2019-12-07 ENCOUNTER — Ambulatory Visit (INDEPENDENT_AMBULATORY_CARE_PROVIDER_SITE_OTHER): Payer: Medicare PPO | Admitting: Internal Medicine

## 2019-12-07 ENCOUNTER — Encounter: Payer: Self-pay | Admitting: Internal Medicine

## 2019-12-07 ENCOUNTER — Other Ambulatory Visit: Payer: Self-pay

## 2019-12-07 VITALS — BP 110/78 | HR 63 | Temp 97.8°F | Ht 69.0 in | Wt 251.0 lb

## 2019-12-07 DIAGNOSIS — R7302 Impaired glucose tolerance (oral): Secondary | ICD-10-CM | POA: Diagnosis not present

## 2019-12-07 DIAGNOSIS — F411 Generalized anxiety disorder: Secondary | ICD-10-CM | POA: Diagnosis not present

## 2019-12-07 DIAGNOSIS — E785 Hyperlipidemia, unspecified: Secondary | ICD-10-CM | POA: Diagnosis not present

## 2019-12-07 DIAGNOSIS — I10 Essential (primary) hypertension: Secondary | ICD-10-CM | POA: Diagnosis not present

## 2019-12-07 MED ORDER — TRIAMCINOLONE ACETONIDE 0.5 % EX CREA: 1.0000 "application " | TOPICAL_CREAM | Freq: Three times a day (TID) | CUTANEOUS | 1 refills | Status: DC

## 2019-12-07 NOTE — Progress Notes (Signed)
Subjective:    Patient ID: Warren Weeks, male    DOB: 1945/11/30, 74 y.o.   MRN: [id number]  HPI  Here to f/u; overall doing ok,  Pt denies chest pain, increasing sob or doe, wheezing, orthopnea, PND, increased LE swelling, palpitations, dizziness or syncope.  Pt denies new neurological symptoms such as new headache, or facial or extremity weakness or numbness.  Pt denies polydipsia, polyuria, or low sugar episode.  Pt states overall good compliance with meds, mostly trying to follow appropriate diet, with wt overall stable,  but little exercise however. No new complaints  Denies worsening depressive symptoms, suicidal ideation, or panic Past Medical History:  Diagnosis Date  . ALLERGIC RHINITIS 09/16/2007  . ALLERGY 03/24/2007  . Allergy   . ANXIETY 03/28/2007  . Arthritis    knees, back,shoulder  . CAROTID ARTERY STENOSIS, BILATERAL 07/01/2009   yearly checks - no current problems  . CEPHALGIA 06/08/2009  . CHEST PAIN 09/16/2007   no current problems  . COLONIC POLYPS, HX OF 09/16/2007  . COPD (chronic obstructive pulmonary disease) ([place]) 05/12/2017   mild per patient - no inhaler  . Dizziness and giddiness 06/08/2009  . 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 DYSPNEA/SHORTNESS OF BREATH 09/16/2007  . ERECTILE DYSFUNCTION 03/28/2007  . ESOPHAGEAL STRICTURE 09/16/2007  . ESOPHAGITIS 03/24/2007  . GERD 03/24/2007  . GLUCOSE INTOLERANCE 09/16/2007  . HEMORRHOIDS, INTERNAL 03/24/2007  . HYPERCHOLESTEROLEMIA 03/24/2007  . HYPERLIPIDEMIA 09/16/2007  . HYPERTENSION 03/28/2007  . 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 Impaired glucose tolerance 10/04/2010  . LOW BACK PAIN 03/28/2007  . MAGNETIC RESONANCE IMAGING, BRAIN, ABNORMAL 06/26/2009  . NECK PAIN, LEFT 06/20/2010  . OBESITY 03/24/2007  . OTITIS MEDIA, LEFT 06/10/2009  . Palpitations 06/20/2010   no current problems  . PVD 06/25/2009  . RASH-NONVESICULAR 06/17/2009  . URI 08/16/2009   Past Surgical History:  Procedure Laterality Date  . COLONOSCOPY   03/2013   polyps/[place]  . s/p right knee arthroscopy    . UPPER GASTROINTESTINAL ENDOSCOPY     gerd    reports that he has quit smoking. His smoking use included cigarettes. He has a 7.50 pack-year smoking history. He has never used smokeless tobacco. He reports current alcohol use of about 7.0 standard drinks of alcohol per week. He reports that he does not use drugs. family history includes Coronary artery disease in his brother and sister; Heart disease in his father; Lung cancer in his sister; Lymphoma in his mother; Ovarian cancer in his sister. Allergies  Allergen Reactions  . Cefuroxime Axetil Rash   Current Outpatient Medications on File Prior to Visit  Medication Sig Dispense Refill  . acetaminophen (TYLENOL) 650 MG CR tablet Take 650 mg by mouth every 8 (eight) hours as needed for pain.    Marland Kitchen aspirin (ECOTRIN LOW STRENGTH) 81 MG EC tablet Take 81 mg by mouth daily.      . Cyanocobalamin (B-12 PO) Take 1 tablet by mouth daily.    . diclofenac (VOLTAREN) 75 MG EC tablet Take 1 tablet (75 mg total) by mouth 2 (two) times daily. 60 tablet 2  . ezetimibe (ZETIA) 10 MG tablet Take 1 tablet by mouth once daily 90 tablet 1  . fluticasone (FLONASE) 50 MCG/ACT nasal spray USE 2 SPRAY(S) IN EACH NOSTRIL ONCE DAILY 16 g 5  . hydrochlorothiazide (HYDRODIURIL) 25 MG tablet Take 1 tablet by mouth once daily 90 tablet 0  . lisinopril (ZESTRIL) 20 MG tablet Take 1 tablet by mouth once daily 90  tablet 1  . Melatonin 10 MG TABS Take 10 mg by mouth at bedtime.     . methocarbamol (ROBAXIN) 500 MG tablet Take 1 tablet (500 mg total) by mouth 2 (two) times daily as needed. 30 tablet 1  . metoprolol tartrate (LOPRESSOR) 50 MG tablet Take 1 tablet (50 mg total) by mouth 2 (two) times daily. 180 tablet 1  . Multiple Vitamin (ONE-A-DAY 55 PLUS PO) Take 1 tablet by mouth daily.    Marland Kitchen omeprazole (PRILOSEC) 20 MG capsule Take 1 capsule by mouth twice daily 180 capsule 3  . rosuvastatin (CRESTOR) 40 MG tablet  Take 1 tablet by mouth once daily 90 tablet 0  . sildenafil (VIAGRA) 100 MG tablet Take 1 tablet (100 mg total) by mouth as needed for erectile dysfunction. 10 tablet 11  . terazosin (HYTRIN) 5 MG capsule Take 5 mg by mouth at bedtime.     . thiamine 250 MG tablet Take 250 mg by mouth daily.    Marland Kitchen tiZANidine (ZANAFLEX) 2 MG tablet Take 1 tablet (2 mg total) by mouth 2 (two) times daily as needed for muscle spasms. 30 tablet 0  . Turmeric 500 MG TABS Take by mouth daily.     No current facility-administered medications on file prior to visit.   Review of Systems All otherwise neg per pt     Objective:   Physical Exam BP 110/78 (BP Location: Left Arm, Patient Position: Sitting, Cuff Size: Large)   Pulse 63   Temp 97.8 F (36.6 C) (Oral)   Ht 5\' 9"  (1.753 m)   Wt 251 lb (113.9 kg)   SpO2 95%   BMI 37.07 kg/m  VS noted,  Constitutional: Pt appears in NAD HENT: Head: NCAT.  Right Ear: External ear normal.  Left Ear: External ear normal.  Eyes: . Pupils are equal, round, and reactive to light. Conjunctivae and EOM are normal Nose: without d/c or deformity Neck: Neck supple. Gross normal ROM Cardiovascular: Normal rate and regular rhythm.   Pulmonary/Chest: Effort normal and breath sounds without rales or wheezing.  Abd:  Soft, NT, ND, + BS, no organomegaly Neurological: Pt is alert. At baseline orientation, motor grossly intact Skin: Skin is warm. No rashes, other new lesions, no LE edema Psychiatric: Pt behavior is normal without agitation  All otherwise neg per pt Lab Results  Component Value Date   WBC 5.7 09/14/2019   HGB 13.9 09/14/2019   HCT 41.8 09/14/2019   PLT 147.0 (L) 09/14/2019   GLUCOSE 101 (H) 09/14/2019   CHOL 157 09/14/2019   TRIG 122.0 09/14/2019   HDL 54.40 09/14/2019   LDLDIRECT 176.0 04/03/2011   LDLCALC 78 09/14/2019   ALT 47 09/14/2019   AST 32 09/14/2019   NA 139 09/14/2019   K 4.5 09/14/2019   CL 107 09/14/2019   CREATININE 1.03 09/14/2019    BUN 19 09/14/2019   CO2 28 09/14/2019   TSH 1.69 09/14/2019   PSA 0.13 09/14/2019   HGBA1C 6.6 (H) 09/14/2019   MICROALBUR <0.7 09/14/2019      Assessment & Plan:

## 2019-12-07 NOTE — Assessment & Plan Note (Signed)
stable overall by history and exam, recent data reviewed with pt, and pt to continue medical treatment as before,  to f/u any worsening symptoms or concerns  

## 2019-12-07 NOTE — Assessment & Plan Note (Addendum)

## 2019-12-07 NOTE — Patient Instructions (Signed)
Your A1c was OK today  Please continue all other medications as before, and refills have been done if requested.  Please have the pharmacy call with any other refills you may need.  Please continue your efforts at being more active, low cholesterol diet, and weight control  Please keep your appointments with your specialists as you may have planned  Please make an Appointment to return in 6 months, or sooner if needed 

## 2019-12-15 ENCOUNTER — Other Ambulatory Visit: Payer: Self-pay | Admitting: Internal Medicine

## 2020-01-06 ENCOUNTER — Other Ambulatory Visit: Payer: Self-pay | Admitting: Internal Medicine

## 2020-01-06 NOTE — Telephone Encounter (Signed)
Please refill as per office routine med refill policy (all routine meds refilled for 3 mo or monthly per pt preference up to one year from last visit, then month to month grace period for 3 mo, then further med refills will have to be denied)  

## 2020-01-10 ENCOUNTER — Other Ambulatory Visit: Payer: Self-pay | Admitting: Internal Medicine

## 2020-01-10 NOTE — Telephone Encounter (Signed)
Please refill as per office routine med refill policy (all routine meds refilled for 3 mo or monthly per pt preference up to one year from last visit, then month to month grace period for 3 mo, then further med refills will have to be denied)  

## 2020-01-16 ENCOUNTER — Other Ambulatory Visit: Payer: Self-pay | Admitting: Physician Assistant

## 2020-01-18 ENCOUNTER — Other Ambulatory Visit: Payer: Self-pay

## 2020-01-18 MED ORDER — METOPROLOL TARTRATE 50 MG PO TABS: 50.0000 mg | ORAL_TABLET | Freq: Two times a day (BID) | ORAL | 1 refills | Status: DC

## 2020-01-23 ENCOUNTER — Telehealth: Payer: Self-pay | Admitting: Orthopaedic Surgery

## 2020-01-23 ENCOUNTER — Other Ambulatory Visit: Payer: Self-pay | Admitting: Physician Assistant

## 2020-01-23 MED ORDER — METHOCARBAMOL 500 MG PO TABS: 500.0000 mg | ORAL_TABLET | Freq: Two times a day (BID) | ORAL | 1 refills | Status: DC | PRN

## 2020-01-23 NOTE — Telephone Encounter (Signed)
Patient called requesting a refill of methocarbamol. Please send to pharmacy on file. Patient asked for a call back when refill has be sent in. Patient phone number is [phone]

## 2020-01-23 NOTE — Telephone Encounter (Signed)
Sent in

## 2020-02-01 ENCOUNTER — Other Ambulatory Visit: Payer: Self-pay | Admitting: Internal Medicine

## 2020-02-14 ENCOUNTER — Encounter (HOSPITAL_COMMUNITY): Payer: Self-pay

## 2020-02-14 ENCOUNTER — Telehealth: Payer: Self-pay | Admitting: Internal Medicine

## 2020-02-14 ENCOUNTER — Emergency Department (HOSPITAL_COMMUNITY): Payer: Medicare PPO

## 2020-02-14 ENCOUNTER — Other Ambulatory Visit: Payer: Self-pay

## 2020-02-14 ENCOUNTER — Emergency Department (HOSPITAL_COMMUNITY)
Admission: EM | Admit: 2020-02-14 | Discharge: 2020-02-15 | Disposition: A | Discharge status: Home / Self Care | Payer: Medicare PPO | Attending: Emergency Medicine | Admitting: Emergency Medicine

## 2020-02-14 DIAGNOSIS — Z87891 Personal history of nicotine dependence: Secondary | ICD-10-CM | POA: Diagnosis not present

## 2020-02-14 DIAGNOSIS — I493 Ventricular premature depolarization: Secondary | ICD-10-CM | POA: Insufficient documentation

## 2020-02-14 DIAGNOSIS — J449 Chronic obstructive pulmonary disease, unspecified: Secondary | ICD-10-CM | POA: Diagnosis not present

## 2020-02-14 DIAGNOSIS — R002 Palpitations: Secondary | ICD-10-CM

## 2020-02-14 DIAGNOSIS — Z7982 Long term (current) use of aspirin: Secondary | ICD-10-CM | POA: Diagnosis not present

## 2020-02-14 DIAGNOSIS — Z79899 Other long term (current) drug therapy: Secondary | ICD-10-CM | POA: Insufficient documentation

## 2020-02-14 DIAGNOSIS — R600 Localized edema: Secondary | ICD-10-CM | POA: Insufficient documentation

## 2020-02-14 DIAGNOSIS — I10 Essential (primary) hypertension: Secondary | ICD-10-CM | POA: Insufficient documentation

## 2020-02-14 LAB — BASIC METABOLIC PANEL
Anion gap: 11 (ref 5–15)
BUN: 15 mg/dL (ref 8–23)
CO2: 23 mmol/L (ref 22–32)
Calcium: 9.7 mg/dL (ref 8.9–10.3)
Chloride: 107 mmol/L (ref 98–111)
Creatinine, Ser: 0.91 mg/dL (ref 0.61–1.24)
GFR calc Af Amer: >60 mL/min (ref >60)
GFR calc non Af Amer: >60 mL/min (ref >60)
Glucose, Bld: 96 mg/dL (ref 70–99)
Potassium: 4 mmol/L (ref 3.5–5.1)
Sodium: 141 mmol/L (ref 135–145)

## 2020-02-14 LAB — CBC
HCT: 41.9 % (ref 39.0–52.0)
Hemoglobin: 13.3 g/dL (ref 13.0–17.0)
MCH: 29.4 pg (ref 26.0–34.0)
MCHC: 31.7 g/dL (ref 30.0–36.0)
MCV: 92.5 fL (ref 80.0–100.0)
Platelets: 162 10*3/uL (ref 150–400)
RBC: 4.53 MIL/uL (ref 4.22–5.81)
RDW: 12.9 % (ref 11.5–15.5)
WBC: 5 10*3/uL (ref 4.0–10.5)
nRBC: 0 % (ref 0.0–0.2)

## 2020-02-14 LAB — TROPONIN I (HIGH SENSITIVITY)
Troponin I (High Sensitivity): 3 ng/L (ref <18)
Troponin I (High Sensitivity): 4 ng/L (ref <18)

## 2020-02-14 NOTE — ED Triage Notes (Addendum)
Pt reports chest pain,palpitations and dizziness today. States when the palpitations come on he also gets a dry cough HR 71 in triage. Pt a.o

## 2020-02-14 NOTE — Telephone Encounter (Signed)
    Patient calling to report heart palpitations, flutter, dry cough and dizziness. Patient symptoms started several days ago, getting worse  Call transferred to [facility]

## 2020-02-15 NOTE — Telephone Encounter (Signed)
[facility] Report/Call: ---Caller states that he is having heart palpitations, flutter, dizziness, and dry cough. He had light symptoms starting Saturday, but they have worsened.  Advised go to ED now. Patient did go to ED.

## 2020-02-15 NOTE — Discharge Instructions (Addendum)
Return to the emergency department with any new or worsening symptoms.  Please follow-up with your primary care provider to schedule a follow-up appointment regarding your symptoms as well as this visit.  It was a pleasure to meet you.

## 2020-02-15 NOTE — ED Notes (Signed)
Got patient into a gown on the monitor patient is resting with call bell in reach 

## 2020-02-15 NOTE — ED Provider Notes (Signed)
[facility] EMERGENCY DEPARTMENT Provider Note   CSN: [id number] Arrival date & time: 02/14/20  1601     History Chief Complaint  Patient presents with  . Palpitations  . Chest Pain  . Dizziness    Warren Weeks is a 74 y.o. male.  HPI   Patient is a 74 year old male with a history of hypertension, hyperlipidemia, COPD, carotid artery stenosis, GERD, esophagitis who presents to the emergency department due to palpitations.  Patient states that he started experiencing intermittent palpitations about 5 days ago.  Yesterday felt like they increased in frequency and were happening about every 30 minutes and lasting for 15 to 30 seconds with each occurrence.  During the palpitations he will sometimes experience a very mild sharp central chest pain that last 2 to 3 seconds and spontaneously alleviates.  He additionally reports lightheadedness when moving from a sitting to a standing position but does not believe that he is more lightheaded when experiencing palpitations.  His last episode of palpitations was yesterday.  He states that when he experiences palpitations he will burp more often and also will sometimes experience a dry cough.  He feels as if coughing and burping seem to help his symptoms.  He does confirm his history of GERD and states that he takes omeprazole daily.  Patient also reports some chronic bilateral lower extremity edema which he does not feel has acutely worsened.  He discussed his symptoms with his PCP who recommended that he come to the emergency department for evaluation.  He drinks coffee intermittently but denies any use of energy drinks. No drug use. Infrequent ETOH use. No fevers, chills, shortness of breath, abdominal pain, vomiting, urinary changes, syncope, dizziness.     Past Medical History:  Diagnosis Date  . ALLERGIC RHINITIS 09/16/2007  . ALLERGY 03/24/2007  . Allergy   . ANXIETY 03/28/2007  . Arthritis    knees, back,shoulder  .  CAROTID ARTERY STENOSIS, BILATERAL 07/01/2009   yearly checks - no current problems  . CEPHALGIA 06/08/2009  . CHEST PAIN 09/16/2007   no current problems  . COLONIC POLYPS, HX OF 09/16/2007  . COPD (chronic obstructive pulmonary disease) ([place]) 05/12/2017   mild per patient - no inhaler  . Dizziness and giddiness 06/08/2009  . 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 DYSPNEA/SHORTNESS OF BREATH 09/16/2007  . ERECTILE DYSFUNCTION 03/28/2007  . ESOPHAGEAL STRICTURE 09/16/2007  . ESOPHAGITIS 03/24/2007  . GERD 03/24/2007  . GLUCOSE INTOLERANCE 09/16/2007  . HEMORRHOIDS, INTERNAL 03/24/2007  . HYPERCHOLESTEROLEMIA 03/24/2007  . HYPERLIPIDEMIA 09/16/2007  . HYPERTENSION 03/28/2007  . 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 Impaired glucose tolerance 10/04/2010  . LOW BACK PAIN 03/28/2007  . MAGNETIC RESONANCE IMAGING, BRAIN, ABNORMAL 06/26/2009  . NECK PAIN, LEFT 06/20/2010  . OBESITY 03/24/2007  . OTITIS MEDIA, LEFT 06/10/2009  . Palpitations 06/20/2010   no current problems  . PVD 06/25/2009  . RASH-NONVESICULAR 06/17/2009  . URI 08/16/2009    Patient Active Problem List   Diagnosis Date Noted  . Diastolic dysfunction 84/53/6468  . Urinary frequency 09/14/2019  . Hoarseness of voice 12/07/2018  . Urinary leakage 12/07/2018  . Left knee pain 12/07/2018  . Chronic sinusitis 10/05/2018  . 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 COPD (chronic obstructive pulmonary disease) ([place]) 05/12/2017  . Pleurisy 03/16/2017  . Bilateral leg pain 11/22/2016  . Insomnia 10/13/2016  . Cough 07/22/2016  . Wheezing 07/22/2016  . Peripheral edema 02/21/2016  . Venous insufficiency 02/05/2016  . Low back pain radiating to lower  extremity 02/05/2016  . Vertigo   . Hypersomnolence 10/24/2015  . 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 Chest pain 01/18/2013  . Hematochezia 10/12/2012  . Viral illness 05/26/2012  . Palpitations 01/05/2012  . 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 Fatigue 04/09/2011  . Rash 12/22/2010  . Encounter for  long-term (current) use of high-risk medication 10/08/2010  . Impaired glucose tolerance 10/04/2010  . Encounter for well adult exam with abnormal findings 10/04/2010  . Carotid artery stenosis 07/01/2009  . PVD 06/25/2009  . Allergic rhinitis 09/16/2007  . ESOPHAGEAL STRICTURE 09/16/2007  . COLONIC POLYPS, HX OF 09/16/2007  . Anxiety state 03/28/2007  . ERECTILE DYSFUNCTION 03/28/2007  . Hyperlipidemia 03/28/2007  . Essential hypertension 03/28/2007  . LOW BACK PAIN 03/28/2007  . OBESITY 03/24/2007  . HEMORRHOIDS, INTERNAL 03/24/2007  . ESOPHAGITIS 03/24/2007  . GERD 03/24/2007    Past Surgical History:  Procedure Laterality Date  . COLONOSCOPY  03/2013   polyps/[place]  . s/p right knee arthroscopy    . UPPER GASTROINTESTINAL ENDOSCOPY     gerd       Family History  Problem Relation Age of Onset  . Lymphoma Mother   . Heart disease Father        CHF  . Ovarian cancer Sister   . Lung cancer Sister   . Coronary artery disease Sister        CABG  . Coronary artery disease Brother        stent, and CAD  . Colon cancer Neg Hx   . Esophageal cancer Neg Hx   . Rectal cancer Neg Hx   . Stomach cancer Neg Hx   . Colon polyps Neg Hx     Social History   Tobacco Use  . Smoking status: Former Smoker    Packs/day: 0.50    Years: 15.00    Pack years: 7.50    Types: Cigarettes  . Smokeless tobacco: Never Used  . Tobacco comment: Quit 30 years ago  Vaping Use  . Vaping Use: Never used  Substance Use Topics  . Alcohol use: Yes    Alcohol/week: 7.0 standard drinks    Types: 7 Cans of beer per week    Comment: beer most days  . Drug use: No    Home Medications Prior to Admission medications   Medication Sig Start Date End Date Taking? Authorizing Provider  acetaminophen (TYLENOL) 650 MG CR tablet Take 650 mg by mouth every 8 (eight) hours as needed for pain.    [provider]  aspirin (ECOTRIN LOW STRENGTH) 81 MG EC tablet Take 81 mg by mouth daily.       [provider]  Cyanocobalamin (B-12 PO) Take 1 tablet by mouth daily.    [provider]  diclofenac (VOLTAREN) 75 MG EC tablet Take 1 tablet (75 mg total) by mouth 2 (two) times daily. 09/05/19   Aundra Dubin, PA-C  ezetimibe (ZETIA) 10 MG tablet Take 1 tablet by mouth once daily 01/12/20   Biagio Borg, MD  fluticasone [facility]) 50 MCG/ACT nasal spray Use 2 spray(s) in each nostril once daily 02/01/20   Biagio Borg, MD  hydrochlorothiazide (HYDRODIURIL) 25 MG tablet Take 1 tablet by mouth once daily 03/29/19   Biagio Borg, MD  lisinopril (ZESTRIL) 20 MG tablet Take 1 tablet by mouth once daily 01/08/20   Biagio Borg, MD  Melatonin 10 MG TABS Take 10 mg by mouth at bedtime.     [provider]  methocarbamol (ROBAXIN) 500 MG tablet Take 1 tablet (500 mg total) by mouth 2 (two) times daily as needed. 01/23/20   Aundra Dubin, PA-C  metoprolol tartrate (LOPRESSOR) 50 MG tablet Take 1 tablet (50 mg total) by mouth 2 (two) times daily. 01/18/20   Biagio Borg, MD  Multiple Vitamin (ONE-A-DAY 55 PLUS PO) Take 1 tablet by mouth daily.    [provider]  omeprazole (PRILOSEC) 20 MG capsule Take 1 capsule by mouth twice daily 10/20/19   Biagio Borg, MD  rosuvastatin (CRESTOR) 40 MG tablet Take 1 tablet by mouth once daily 11/10/19   Biagio Borg, MD  sildenafil (VIAGRA) 100 MG tablet Take 1 tablet (100 mg total) by mouth as needed for erectile dysfunction. 12/20/12   Biagio Borg, MD  terazosin (HYTRIN) 5 MG capsule Take 5 mg by mouth at bedtime.  03/22/15   [provider]  thiamine 250 MG tablet Take 250 mg by mouth daily.    [provider]  tiZANidine (ZANAFLEX) 2 MG tablet Take 1 tablet (2 mg total) by mouth 2 (two) times daily as needed for muscle spasms. 03/15/19   Aundra Dubin, PA-C  triamcinolone cream (KENALOG) 0.5 % Apply 1 application topically 3 (three) times daily. 12/07/19   Biagio Borg, MD  Turmeric 500 MG TABS Take by  mouth daily.    [provider]    Allergies    Cefuroxime axetil  Review of Systems   Review of Systems  All other systems reviewed and are negative. Ten systems reviewed and are negative for acute change, except as noted in the HPI.   Physical Exam Updated Vital Signs BP (!) 157/74 (BP Location: Right Arm)   Pulse (!) 102   Temp 98.1 F (36.7 C) (Oral)   Resp 20   Ht 5\' 9"  (1.753 m)   Wt 113.4 kg   SpO2 98%   BMI 36.92 kg/m   Physical Exam Vitals and nursing note reviewed.  Constitutional:      General: He is not in acute distress.    Appearance: Normal appearance. He is well-developed. He is obese. He is not ill-appearing, toxic-appearing or diaphoretic.  HENT:     Head: Normocephalic and atraumatic.     Right Ear: External ear normal.     Left Ear: External ear normal.     Nose: Nose normal.     Mouth/Throat:     Mouth: Mucous membranes are moist.     Pharynx: Oropharynx is clear. No oropharyngeal exudate or posterior oropharyngeal erythema.  Eyes:     Extraocular Movements: Extraocular movements intact.  Cardiovascular:     Rate and Rhythm: Normal rate and regular rhythm.     Pulses: Normal pulses.          Radial pulses are 2+ on the right side and 2+ on the left side.       Dorsalis pedis pulses are 2+ on the right side and 2+ on the left side.     Heart sounds: Normal heart sounds. No murmur heard.  No friction rub. No gallop.   Pulmonary:     Effort: Pulmonary effort is normal. No tachypnea, accessory muscle usage or respiratory distress.     Breath sounds: Normal breath sounds. No stridor. No decreased breath sounds, wheezing, rhonchi or rales.  Abdominal:     General: Abdomen is flat.     Palpations: Abdomen is soft.     Tenderness: There is no abdominal  tenderness.  Musculoskeletal:        General: Normal range of motion.     Cervical back: Normal range of motion and neck supple. No tenderness.     Right lower leg: No tenderness. Edema  present.     Left lower leg: No tenderness. Edema present.     Comments: 1+ pitting edema noted in the bilateral lower extremities.  No calf tenderness.  Skin:    General: Skin is warm and dry.  Neurological:     General: No focal deficit present.     Mental Status: He is alert and oriented to person, place, and time.  Psychiatric:        Mood and Affect: Mood normal.        Behavior: Behavior normal.    ED Results / Procedures / Treatments   Labs (all labs ordered are listed, but only abnormal results are displayed) Labs Reviewed  BASIC METABOLIC PANEL  CBC  TROPONIN I (HIGH SENSITIVITY)  TROPONIN I (HIGH SENSITIVITY)   EKG EKG Interpretation  Date/Time:  Wednesday February 14 2020 16:31:06 EDT Ventricular Rate:  74 PR Interval:  122 QRS Duration: 70 QT Interval:  402 QTC Calculation: 446 R Axis:   37 Text Interpretation: Sinus rhythm with occasional Premature ventricular complexes Otherwise normal ECG No STEMI Confirmed by Octaviano Glow [phone]) on 02/15/2020 9:47:22 AM   Radiology DG Chest 2 View  Result Date: 02/14/2020 CLINICAL DATA:  Chest pain, palpitations EXAM: CHEST - 2 VIEW COMPARISON:  10/03/2018 FINDINGS: Heart is normal size. No confluent airspace opacities or effusions. Rounded density noted posteriorly overlying the upper thoracic spine on the lateral view is stable since prior study and felt represent thoracic spurs. IMPRESSION: No active cardiopulmonary disease. Electronically Signed   By: Rolm Baptise M.D.   On: 02/14/2020 17:11    Procedures Procedures (including critical care time)  Medications Ordered in ED Medications - No data to display  ED Course  I have reviewed the triage vital signs and the nursing notes.  Pertinent labs & imaging results that were available during my care of the patient were reviewed by me and considered in my medical decision making (see chart for details).  Clinical Course as of Feb 15 1240  Thu Feb 15, 2020  1140 No  acute abnormalities.   DG Chest 2 View [LJ]  1140 Sinus rhythm with occasional PVC  ED EKG [LJ]  1140 Negative troponin x2  Troponin I (High Sensitivity): 4 [LJ]    Clinical Course User Index [LJ] Rayna Sexton, PA-C   MDM Rules/Calculators/A&P                          Pt is a 74 y.o. male that presents with a history, physical exam, and ED Clinical Course as noted above.   Patient presents today with intermittent palpitations for the last 5 days that were worse yesterday.  Patient's physical exam, lab work, ECG, chest x-ray are all reassuring.  Negative troponin x2.  No electrolyte abnormalities.  Chest x-ray is clear.  ECG showing PVCs but otherwise no abnormalities.  I calculated his heart score to be a 4.  Physical exam was benign.  Discussed this with the patient in length and also discussed with my attending physician Dr. Marye Round who also evaluated the patient and agrees with the above plan.  Feel it is safe to discharge the patient at this time with strict PCP follow-up.  His questions were  answered and he was amicable at the time of discharge.  He has been hypertensive throughout his visit but otherwise his vital signs have been stable.  Patient has not yet had his daily blood pressure medications.   An After Visit Summary was printed and given to the patient.  Patient discharged to home/self care.  Condition at discharge: Stable  Note: Portions of this report may have been transcribed using voice recognition software. Every effort was made to ensure accuracy; however, inadvertent computerized transcription errors may be present.   Final Clinical Impression(s) / ED Diagnoses Final diagnoses:  PVC (premature ventricular contraction)  Palpitations   Rx / DC Orders ED Discharge Orders    None       Rayna Sexton, PA-C 02/15/20 1243    Dorie Rank, MD 02/20/20 1510

## 2020-02-16 ENCOUNTER — Telehealth: Payer: Self-pay | Admitting: Internal Medicine

## 2020-02-16 NOTE — Telephone Encounter (Signed)
Sent to Dr. [name] as an FYI. 

## 2020-02-19 NOTE — Telephone Encounter (Signed)
Not note needed

## 2020-02-28 ENCOUNTER — Encounter: Payer: Self-pay | Admitting: Internal Medicine

## 2020-02-28 ENCOUNTER — Other Ambulatory Visit: Payer: Self-pay

## 2020-02-28 ENCOUNTER — Ambulatory Visit: Payer: Medicare PPO | Admitting: Internal Medicine

## 2020-02-28 VITALS — BP 130/62 | HR 68 | Temp 97.9°F | Ht 69.0 in | Wt 251.0 lb

## 2020-02-28 DIAGNOSIS — Z0000 Encounter for general adult medical examination without abnormal findings: Secondary | ICD-10-CM | POA: Diagnosis not present

## 2020-02-28 DIAGNOSIS — R7302 Impaired glucose tolerance (oral): Secondary | ICD-10-CM | POA: Diagnosis not present

## 2020-02-28 DIAGNOSIS — J449 Chronic obstructive pulmonary disease, unspecified: Secondary | ICD-10-CM | POA: Diagnosis not present

## 2020-02-28 DIAGNOSIS — E785 Hyperlipidemia, unspecified: Secondary | ICD-10-CM

## 2020-02-28 DIAGNOSIS — K219 Gastro-esophageal reflux disease without esophagitis: Secondary | ICD-10-CM | POA: Diagnosis not present

## 2020-02-28 DIAGNOSIS — N400 Enlarged prostate without lower urinary tract symptoms: Secondary | ICD-10-CM | POA: Diagnosis not present

## 2020-02-28 DIAGNOSIS — Z0001 Encounter for general adult medical examination with abnormal findings: Secondary | ICD-10-CM

## 2020-02-28 DIAGNOSIS — R609 Edema, unspecified: Secondary | ICD-10-CM

## 2020-02-28 DIAGNOSIS — I493 Ventricular premature depolarization: Secondary | ICD-10-CM

## 2020-02-28 DIAGNOSIS — M1990 Unspecified osteoarthritis, unspecified site: Secondary | ICD-10-CM

## 2020-02-28 MED ORDER — HYDROCHLOROTHIAZIDE 25 MG PO TABS
25.0000 mg | ORAL_TABLET | Freq: Every day | ORAL | 3 refills | Status: DC
Start: 2020-02-28 — End: 2021-12-18

## 2020-02-28 MED ORDER — OMEPRAZOLE 20 MG PO CPDR
DELAYED_RELEASE_CAPSULE | ORAL | 3 refills | Status: DC
Start: 2020-02-28 — End: 2020-12-10

## 2020-02-28 NOTE — Patient Instructions (Signed)
Ok to increase the omeprazle to 1 pill in the AM, and two pills in the PM  Please continue all other medications as before, and refills have been done if requested.  Please have the pharmacy call with any other refills you may need.  Please continue your efforts at being more active, low cholesterol diet, and weight control.  You are otherwise up to date with prevention measures today.  Please keep your appointments with your specialists as you may have planned  Please make an Appointment to return in 6 months, or sooner if needed

## 2020-02-28 NOTE — Progress Notes (Signed)
Subjective:    Patient ID: Warren Weeks, male    DOB: [birth year], 74 y.o.   MRN: [id number]  HPI  Here for wellness and f/u;  Overall doing ok;  Pt denies Chest pain, worsening SOB, DOE, wheezing, orthopnea, PND, dizziness or syncope, and several palpitations ongoing, and some worsening venous insufficiency. .  Pt denies neurological change such as new headache, facial or extremity weakness.  Pt denies polydipsia, polyuria, or low sugar symptoms. Pt states overall good compliance with treatment and medications, good tolerability, and has been trying to follow appropriate diet.  Pt denies worsening depressive symptoms, suicidal ideation or panic. No fever, night sweats, wt loss, loss of appetite, or other constitutional symptoms.  Pt states good ability with ADL's, has low fall risk, home safety reviewed and adequate, no other significant changes in hearing or vision, and only occasionally active with exercise.  Denies urinary symptoms such as dysuria, frequency, urgency, flank pain, hematuria or n/v, fever, chills, but plans for f/u urology in nov 2021.  Denies worsening reflux, abd pain, dysphagia, n/v, bowel change or blood, excep has increased reflux at night.  Also has ongoing arthritic pain, not really using the tylenol or volt cream.   Past Medical History:  Diagnosis Date  . ALLERGIC RHINITIS 09/16/2007  . ALLERGY 03/24/2007  . Allergy   . ANXIETY 03/28/2007  . Arthritis    knees, back,shoulder  . CAROTID ARTERY STENOSIS, BILATERAL 07/01/2009   yearly checks - no current problems  . CEPHALGIA 06/08/2009  . CHEST PAIN 09/16/2007   no current problems  . COLONIC POLYPS, HX OF 09/16/2007  . COPD (chronic obstructive pulmonary disease) ([place]) 05/12/2017   mild per patient - no inhaler  . Dizziness and giddiness 06/08/2009  . 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 DYSPNEA/SHORTNESS OF BREATH 09/16/2007  . ERECTILE DYSFUNCTION 03/28/2007  . ESOPHAGEAL STRICTURE 09/16/2007  . ESOPHAGITIS 03/24/2007  .  GERD 03/24/2007  . GLUCOSE INTOLERANCE 09/16/2007  . HEMORRHOIDS, INTERNAL 03/24/2007  . HYPERCHOLESTEROLEMIA 03/24/2007  . HYPERLIPIDEMIA 09/16/2007  . HYPERTENSION 03/28/2007  . 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 Impaired glucose tolerance 10/04/2010  . LOW BACK PAIN 03/28/2007  . MAGNETIC RESONANCE IMAGING, BRAIN, ABNORMAL 06/26/2009  . NECK PAIN, LEFT 06/20/2010  . OBESITY 03/24/2007  . OTITIS MEDIA, LEFT 06/10/2009  . Palpitations 06/20/2010   no current problems  . PVD 06/25/2009  . RASH-NONVESICULAR 06/17/2009  . URI 08/16/2009   Past Surgical History:  Procedure Laterality Date  . COLONOSCOPY  03/2013   polyps/[place]  . s/p right knee arthroscopy    . UPPER GASTROINTESTINAL ENDOSCOPY     gerd    reports that he has quit smoking. His smoking use included cigarettes. He has a 7.50 pack-year smoking history. He has never used smokeless tobacco. He reports current alcohol use of about 7.0 standard drinks of alcohol per week. He reports that he does not use drugs. family history includes Coronary artery disease in his brother and sister; Heart disease in his father; Lung cancer in his sister; Lymphoma in his mother; Ovarian cancer in his sister. Allergies  Allergen Reactions  . Cefuroxime Axetil Rash   Current Outpatient Medications on File Prior to Visit  Medication Sig Dispense Refill  . acetaminophen (TYLENOL) 650 MG CR tablet Take 650 mg by mouth every 8 (eight) hours as needed for pain.    Marland Kitchen aspirin (ECOTRIN LOW STRENGTH) 81 MG EC tablet Take 81 mg by mouth daily.      . Cyanocobalamin (B-12 PO)  Take 1 tablet by mouth daily.    . diclofenac (VOLTAREN) 75 MG EC tablet Take 1 tablet (75 mg total) by mouth 2 (two) times daily. 60 tablet 2  . ezetimibe (ZETIA) 10 MG tablet Take 1 tablet by mouth once daily 90 tablet 0  . fluticasone (FLONASE) 50 MCG/ACT nasal spray Use 2 spray(s) in each nostril once daily 16 g 0  . lisinopril (ZESTRIL) 20 MG tablet Take 1 tablet by mouth once daily 90  tablet 0  . Melatonin 10 MG TABS Take 10 mg by mouth at bedtime.     . methocarbamol (ROBAXIN) 500 MG tablet Take 1 tablet (500 mg total) by mouth 2 (two) times daily as needed. 30 tablet 1  . metoprolol tartrate (LOPRESSOR) 50 MG tablet Take 1 tablet (50 mg total) by mouth 2 (two) times daily. 180 tablet 1  . Multiple Vitamin (ONE-A-DAY 55 PLUS PO) Take 1 tablet by mouth daily.    . rosuvastatin (CRESTOR) 40 MG tablet Take 1 tablet by mouth once daily 90 tablet 0  . sildenafil (VIAGRA) 100 MG tablet Take 1 tablet (100 mg total) by mouth as needed for erectile dysfunction. 10 tablet 11  . terazosin (HYTRIN) 5 MG capsule Take 5 mg by mouth at bedtime.     . thiamine 250 MG tablet Take 250 mg by mouth daily.    Marland Kitchen tiZANidine (ZANAFLEX) 2 MG tablet Take 1 tablet (2 mg total) by mouth 2 (two) times daily as needed for muscle spasms. 30 tablet 0  . triamcinolone cream (KENALOG) 0.5 % Apply 1 application topically 3 (three) times daily. 30 g 1  . Turmeric 500 MG TABS Take by mouth daily.     No current facility-administered medications on file prior to visit.   Review of Systems All otherwise neg per pt    Objective:   Physical Exam BP 130/62 (BP Location: Left Arm, Patient Position: Sitting, Cuff Size: Large)   Pulse 68   Temp 97.9 F (36.6 C) (Oral)   Ht 5\' 9"  (1.753 m)   Wt 251 lb (113.9 kg)   SpO2 96%   BMI 37.07 kg/m  VS noted,  Constitutional: Pt appears in NAD HENT: Head: NCAT.  Right Ear: External ear normal.  Left Ear: External ear normal.  Eyes: . Pupils are equal, round, and reactive to light. Conjunctivae and EOM are normal Nose: without d/c or deformity Neck: Neck supple. Gross normal ROM Cardiovascular: Normal rate and regular rhythm.   Pulmonary/Chest: Effort normal and breath sounds without rales or wheezing.  Abd:  Soft, NT, ND, + BS, no organomegaly Neurological: Pt is alert. At baseline orientation, motor grossly intact Skin: Skin is warm. No rashes, other new  lesions, trace bilateral LE edema Psychiatric: Pt behavior is normal without agitation  All otherwise neg per pt Lab Results  Component Value Date   WBC 5.0 02/14/2020   HGB 13.3 02/14/2020   HCT 41.9 02/14/2020   PLT 162 02/14/2020   GLUCOSE 96 02/14/2020   CHOL 157 09/14/2019   TRIG 122.0 09/14/2019   HDL 54.40 09/14/2019   LDLDIRECT 176.0 04/03/2011   LDLCALC 78 09/14/2019   ALT 47 09/14/2019   AST 32 09/14/2019   NA 141 02/14/2020   K 4.0 02/14/2020   CL 107 02/14/2020   CREATININE 0.91 02/14/2020   BUN 15 02/14/2020   CO2 23 02/14/2020   TSH 1.69 09/14/2019   PSA 0.13 09/14/2019   HGBA1C 6.6 (H) 09/14/2019   MICROALBUR <0.7  09/14/2019       Assessment & Plan:

## 2020-03-02 ENCOUNTER — Encounter: Payer: Self-pay | Admitting: Internal Medicine

## 2020-03-02 DIAGNOSIS — M1990 Unspecified osteoarthritis, unspecified site: Secondary | ICD-10-CM | POA: Insufficient documentation

## 2020-03-02 DIAGNOSIS — N400 Enlarged prostate without lower urinary tract symptoms: Secondary | ICD-10-CM | POA: Insufficient documentation

## 2020-03-02 DIAGNOSIS — I493 Ventricular premature depolarization: Secondary | ICD-10-CM | POA: Insufficient documentation

## 2020-03-02 NOTE — Assessment & Plan Note (Signed)
C/w venous insufficiency, for restart hct

## 2020-03-02 NOTE — Assessment & Plan Note (Signed)
Cont tylenol, volt gel prn

## 2020-03-02 NOTE — Assessment & Plan Note (Signed)
Has f/u urology nov 2021

## 2020-03-02 NOTE — Assessment & Plan Note (Signed)
stable overall by history and exam, recent data reviewed with pt, and pt to continue medical treatment as before,  to f/u any worsening symptoms or concerns  

## 2020-03-02 NOTE — Assessment & Plan Note (Signed)
[place] for increased omeprazole to 20 qm, 40 pm

## 2020-03-02 NOTE — Assessment & Plan Note (Signed)

## 2020-03-02 NOTE — Assessment & Plan Note (Addendum)
Cont current tx, stable  I spent 31 minutes in addition to time for CPX wellness examination in preparing to see the patient by review of recent labs, imaging and procedures, obtaining and reviewing separately obtained history, communicating with the patient and family or caregiver, ordering medications, tests or procedures, and documenting clinical information in the EHR including the differential Dx, treatment, and any further evaluation and other management of pvc, copd, arthritis, bph, gerd, hld, hyperglycemia, peripheral edema

## 2020-03-15 ENCOUNTER — Other Ambulatory Visit: Payer: Self-pay | Admitting: Internal Medicine

## 2020-03-19 ENCOUNTER — Ambulatory Visit: Payer: Medicare PPO | Admitting: Internal Medicine

## 2020-03-26 DIAGNOSIS — Z20822 Contact with and (suspected) exposure to covid-19: Secondary | ICD-10-CM | POA: Diagnosis not present

## 2020-03-27 ENCOUNTER — Other Ambulatory Visit: Payer: Self-pay

## 2020-03-27 ENCOUNTER — Other Ambulatory Visit: Payer: Self-pay | Admitting: Family

## 2020-03-27 ENCOUNTER — Telehealth (INDEPENDENT_AMBULATORY_CARE_PROVIDER_SITE_OTHER): Payer: Medicare PPO | Admitting: Family

## 2020-03-27 DIAGNOSIS — R059 Cough, unspecified: Secondary | ICD-10-CM | POA: Diagnosis not present

## 2020-03-27 DIAGNOSIS — J209 Acute bronchitis, unspecified: Secondary | ICD-10-CM

## 2020-03-27 MED ORDER — MOMETASONE FUROATE 50 MCG/ACT NA SUSP: 2.0000 | Freq: Every day | NASAL | 12 refills | Status: DC

## 2020-03-27 MED ORDER — AZITHROMYCIN 250 MG PO TABS: ORAL_TABLET | ORAL | 0 refills | Status: DC

## 2020-03-27 MED ORDER — PREDNISONE 20 MG PO TABS: 20.0000 mg | ORAL_TABLET | Freq: Every day | ORAL | 0 refills | Status: DC

## 2020-03-27 NOTE — Progress Notes (Signed)
Warren Weeks is a 74 y.o. male with the following history as recorded in EpicCare:  Patient Active Problem List   Diagnosis Date Noted  . PVC's (premature ventricular contractions) 03/02/2020  . BPH (benign prostatic hyperplasia) 03/02/2020  . Arthritis 03/02/2020  . Diastolic dysfunction 81/85/6314  . Urinary frequency 09/14/2019  . Hoarseness of voice 12/07/2018  . Urinary leakage 12/07/2018  . Left knee pain 12/07/2018  . Chronic sinusitis 10/05/2018  . 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 COPD (chronic obstructive pulmonary disease) ([place]) 05/12/2017  . Pleurisy 03/16/2017  . Bilateral leg pain 11/22/2016  . Insomnia 10/13/2016  . Cough 07/22/2016  . Wheezing 07/22/2016  . Peripheral edema 02/21/2016  . Venous insufficiency 02/05/2016  . Low back pain radiating to lower extremity 02/05/2016  . Vertigo   . Hypersomnolence 10/24/2015  . 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 Chest pain 01/18/2013  . Hematochezia 10/12/2012  . Viral illness 05/26/2012  . Palpitations 01/05/2012  . 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 Fatigue 04/09/2011  . Rash 12/22/2010  . Encounter for long-term (current) use of high-risk medication 10/08/2010  . Impaired glucose tolerance 10/04/2010  . Encounter for well adult exam with abnormal findings 10/04/2010  . Carotid artery stenosis 07/01/2009  . PVD 06/25/2009  . Allergic rhinitis 09/16/2007  . ESOPHAGEAL STRICTURE 09/16/2007  . COLONIC POLYPS, HX OF 09/16/2007  . Anxiety state 03/28/2007  . ERECTILE DYSFUNCTION 03/28/2007  . Hyperlipidemia 03/28/2007  . Essential hypertension 03/28/2007  . LOW BACK PAIN 03/28/2007  . OBESITY 03/24/2007  . HEMORRHOIDS, INTERNAL 03/24/2007  . ESOPHAGITIS 03/24/2007  . GERD 03/24/2007    Current Outpatient Medications  Medication Sig Dispense Refill  . acetaminophen (TYLENOL) 650 MG CR tablet Take 650 mg by mouth every 8 (eight) hours as needed for pain.    Marland Kitchen aspirin (ECOTRIN LOW STRENGTH) 81 MG EC  tablet Take 81 mg by mouth daily.      Marland Kitchen azithromycin (ZITHROMAX) 250 MG tablet 2 tabs po qd x 1 day; 1 tablet per day x 4 days; 6 tablet 0  . Cyanocobalamin (B-12 PO) Take 1 tablet by mouth daily.    . diclofenac (VOLTAREN) 75 MG EC tablet Take 1 tablet (75 mg total) by mouth 2 (two) times daily. 60 tablet 2  . ezetimibe (ZETIA) 10 MG tablet Take 1 tablet by mouth once daily 90 tablet 0  . hydrochlorothiazide (HYDRODIURIL) 25 MG tablet Take 1 tablet (25 mg total) by mouth daily. 90 tablet 3  . lisinopril (ZESTRIL) 20 MG tablet Take 1 tablet by mouth once daily 90 tablet 0  . Melatonin 10 MG TABS Take 10 mg by mouth at bedtime.     . methocarbamol (ROBAXIN) 500 MG tablet Take 1 tablet (500 mg total) by mouth 2 (two) times daily as needed. 30 tablet 1  . metoprolol tartrate (LOPRESSOR) 50 MG tablet Take 1 tablet (50 mg total) by mouth 2 (two) times daily. 180 tablet 1  . mometasone (NASONEX) 50 MCG/ACT nasal spray Place 2 sprays into the nose daily. 1 each 12  . Multiple Vitamin (ONE-A-DAY 55 PLUS PO) Take 1 tablet by mouth daily.    Marland Kitchen omeprazole (PRILOSEC) 20 MG capsule 1 tab by mouth in the am and 2 in the PM 270 capsule 3  . predniSONE (DELTASONE) 20 MG tablet Take 1 tablet (20 mg total) by mouth daily with breakfast. 5 tablet 0  . rosuvastatin (CRESTOR) 40 MG tablet Take 1 tablet by mouth once daily 90 tablet 0  .  sildenafil (VIAGRA) 100 MG tablet Take 1 tablet (100 mg total) by mouth as needed for erectile dysfunction. 10 tablet 11  . terazosin (HYTRIN) 5 MG capsule Take 5 mg by mouth at bedtime.     . thiamine 250 MG tablet Take 250 mg by mouth daily.    Marland Kitchen tiZANidine (ZANAFLEX) 2 MG tablet Take 1 tablet (2 mg total) by mouth 2 (two) times daily as needed for muscle spasms. 30 tablet 0  . triamcinolone cream (KENALOG) 0.5 % Apply 1 application topically 3 (three) times daily. 30 g 1  . Turmeric 500 MG TABS Take by mouth daily.     No current facility-administered medications for this visit.     Allergies: Cefuroxime axetil  Past Medical History:  Diagnosis Date  . ALLERGIC RHINITIS 09/16/2007  . ALLERGY 03/24/2007  . Allergy   . ANXIETY 03/28/2007  . Arthritis    knees, back,shoulder  . CAROTID ARTERY STENOSIS, BILATERAL 07/01/2009   yearly checks - no current problems  . CEPHALGIA 06/08/2009  . CHEST PAIN 09/16/2007   no current problems  . COLONIC POLYPS, HX OF 09/16/2007  . COPD (chronic obstructive pulmonary disease) ([place]) 05/12/2017   mild per patient - no inhaler  . Dizziness and giddiness 06/08/2009  . 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 DYSPNEA/SHORTNESS OF BREATH 09/16/2007  . ERECTILE DYSFUNCTION 03/28/2007  . ESOPHAGEAL STRICTURE 09/16/2007  . ESOPHAGITIS 03/24/2007  . GERD 03/24/2007  . GLUCOSE INTOLERANCE 09/16/2007  . HEMORRHOIDS, INTERNAL 03/24/2007  . HYPERCHOLESTEROLEMIA 03/24/2007  . HYPERLIPIDEMIA 09/16/2007  . HYPERTENSION 03/28/2007  . 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 Impaired glucose tolerance 10/04/2010  . LOW BACK PAIN 03/28/2007  . MAGNETIC RESONANCE IMAGING, BRAIN, ABNORMAL 06/26/2009  . NECK PAIN, LEFT 06/20/2010  . OBESITY 03/24/2007  . OTITIS MEDIA, LEFT 06/10/2009  . Palpitations 06/20/2010   no current problems  . PVD 06/25/2009  . RASH-NONVESICULAR 06/17/2009  . URI 08/16/2009    Past Surgical History:  Procedure Laterality Date  . COLONOSCOPY  03/2013   polyps/[facility]  . s/p right knee arthroscopy    . UPPER GASTROINTESTINAL ENDOSCOPY     [facility]    Family History  Problem Relation Age of Onset  . Lymphoma Mother   . Heart disease Father        CHF  . Ovarian cancer Sister   . Lung cancer Sister   . Coronary artery disease Sister        CABG  . Coronary artery disease Brother        stent, and CAD  . Colon cancer Neg Hx   . Esophageal cancer Neg Hx   . Rectal cancer Neg Hx   . Stomach cancer Neg Hx   . Colon polyps Neg Hx     Social History   Tobacco Use  . Smoking status: Former Smoker    Packs/day: 0.50    Years: 15.00    Pack  years: 7.50    Types: Cigarettes  . Smokeless tobacco: Never Used  . Tobacco comment: Quit 30 years ago  Substance Use Topics  . Alcohol use: Yes    Alcohol/week: 7.0 standard drinks    Types: 7 Cans of beer per week    Comment: beer most days    Subjective:   I connected with Jorje L Bovee on 03/27/20 at 10:00 AM EDT by a video enabled telemedicine application and verified that I am speaking with the correct person using two identifiers.   I discussed the limitations of evaluation  and management by telemedicine and the availability of in person appointments. The patient expressed understanding and agreed to proceed. Provider in office/ patient is at home; provider and patient are only 2 people on video call.   Complaining of 3-4 day history of body aches/ chills; + sore throat/ cough/ congestion; is using Tylenol with some benefit; is fully vaccinated for COVID but needs booster; had rapid test yesterday which was negative; is asking for Zithromax and prednisone which is what is PCP gives him with these symptoms;      Objective:  There were no vitals filed for this visit.  General: Well developed, well nourished, in no acute distress  Head: Normocephalic and atraumatic  Lungs: Respirations unlabored; clear to auscultation bilaterally without wheeze, rales, rhonchi  Neurologic: Alert and oriented; speech intact; face symmetrical; moves all extremities well; CNII-XII intact without focal deficit   Assessment:  1. Acute bronchitis, unspecified organism     Plan:  Will check PCR COVID test; Rx for Z-pak and Prednisone; increase fluids, rest and follow-up worse, no better;   No follow-ups on file.  No orders of the defined types were placed in this encounter.   Requested Prescriptions   Signed Prescriptions Disp Refills  . azithromycin (ZITHROMAX) 250 MG tablet 6 tablet 0    Sig: 2 tabs po qd x 1 day; 1 tablet per day x 4 days;  . predniSONE (DELTASONE) 20 MG tablet 5 tablet 0     Sig: Take 1 tablet (20 mg total) by mouth daily with breakfast.  . mometasone (NASONEX) 50 MCG/ACT nasal spray 1 each 12    Sig: Place 2 sprays into the nose daily.

## 2020-03-29 LAB — SARS-COV-2, NAA 2 DAY TAT

## 2020-03-29 LAB — NOVEL CORONAVIRUS, NAA: SARS-CoV-2, NAA: DETECTED — AB

## 2020-03-30 ENCOUNTER — Telehealth: Payer: Self-pay | Admitting: Physician Assistant

## 2020-03-30 NOTE — Telephone Encounter (Signed)
  Called to discuss with patient about Covid symptoms and the use of casirivimab/imdevimab, a monoclonal antibody infusion for those with mild to moderate Covid symptoms and at a high risk of hospitalization.  Pt is qualified for this infusion at the [facility] due age and hx of HTN.   Message left to call back our hotline [phone].  Leanor Kail, PA - C

## 2020-03-31 ENCOUNTER — Encounter: Payer: Self-pay | Admitting: Nurse Practitioner

## 2020-03-31 ENCOUNTER — Telehealth: Payer: Self-pay | Admitting: Physician Assistant

## 2020-03-31 NOTE — Telephone Encounter (Signed)
Left voice main to call back on monoclonal antibody infusion center hotline.

## 2020-04-01 ENCOUNTER — Other Ambulatory Visit: Payer: Self-pay | Admitting: Family

## 2020-04-01 ENCOUNTER — Other Ambulatory Visit: Payer: Self-pay | Admitting: Internal Medicine

## 2020-04-01 ENCOUNTER — Telehealth (HOSPITAL_COMMUNITY): Payer: Self-pay

## 2020-04-01 DIAGNOSIS — U071 COVID-19: Secondary | ICD-10-CM

## 2020-04-01 NOTE — Progress Notes (Signed)
I connected by phone with Warren Weeks on 04/01/2020 at 7:17 PM to discuss the potential use of a new treatment for mild to moderate COVID-19 viral infection in non-hospitalized patients.  This patient is a 74 y.o. male that meets the FDA criteria for Emergency Use Authorization of COVID monoclonal antibody casirivimab/imdevimab or bamlanivimab/eteseviamb.  Has a (+) direct SARS-CoV-2 viral test result  Has mild or moderate COVID-19   Is NOT hospitalized due to COVID-19  Is within 10 days of symptom onset  Has at least one of the high risk factor(s) for progression to severe COVID-19 and/or hospitalization as defined in EUA.  Specific high risk criteria : Older age (>/= 72 yo)   Symptoms of fever and loss of taste and smell began 10/3.    I have spoken and communicated the following to the patient or parent/caregiver regarding COVID monoclonal antibody treatment:  1. FDA has authorized the emergency use for the treatment of mild to moderate COVID-19 in adults and pediatric patients with positive results of direct SARS-CoV-2 viral testing who are 18 years of age and older weighing at least 40 kg, and who are at high risk for progressing to severe COVID-19 and/or hospitalization.  2. The significant known and potential risks and benefits of COVID monoclonal antibody, and the extent to which such potential risks and benefits are unknown.  3. Information on available alternative treatments and the risks and benefits of those alternatives, including clinical trials.  4. Patients treated with COVID monoclonal antibody should continue to self-isolate and use infection control measures (e.g., wear mask, isolate, social distance, avoid sharing personal items, clean and disinfect "high touch" surfaces, and frequent handwashing) according to CDC guidelines.   5. The patient or parent/caregiver has the option to accept or refuse COVID monoclonal antibody treatment.  After reviewing this  information with the patient, the patient has agreed to receive one of the available covid 19 monoclonal antibodies and will be provided an appropriate fact sheet prior to infusion. Asencion Gowda, NP 04/01/2020 7:17 PM

## 2020-04-01 NOTE — Telephone Encounter (Signed)
Please refill as per office routine med refill policy (all routine meds refilled for 3 mo or monthly per pt preference up to one year from last visit, then month to month grace period for 3 mo, then further med refills will have to be denied)  

## 2020-04-01 NOTE — Telephone Encounter (Signed)
Patient returning Vin's call about the infusion treatment

## 2020-04-01 NOTE — Telephone Encounter (Signed)
RN called pt about monoclonal antibody therapy for the treatment of COVID. Pt stated his symptoms started on 10/3 and has been experiencing fever, chills, loss of taste and smell and tested positive on 10/6. Pt stated he spoke to someone earlier about treatment and they were suppose to call his insurance company and get back to him if charges were covered.  RN advised the pt to call his insurance company himself to find out if charges were covered. Pt informed that an APP would be calling him back to schedule an appointment for tomorrow.

## 2020-04-02 ENCOUNTER — Other Ambulatory Visit: Payer: Self-pay | Admitting: Nurse Practitioner

## 2020-04-02 ENCOUNTER — Ambulatory Visit (HOSPITAL_COMMUNITY)
Admission: RE | Admit: 2020-04-02 | Discharge: 2020-04-02 | Disposition: A | Discharge status: Home / Self Care | Payer: Medicare Other | Source: Ambulatory Visit | Attending: Pulmonary Disease | Admitting: Pulmonary Disease

## 2020-04-02 DIAGNOSIS — Z23 Encounter for immunization: Secondary | ICD-10-CM | POA: Diagnosis present

## 2020-04-02 DIAGNOSIS — U071 COVID-19: Secondary | ICD-10-CM

## 2020-04-02 MED ORDER — METHYLPREDNISOLONE SODIUM SUCC 125 MG IJ SOLR: 125.0000 mg | Freq: Once | INTRAMUSCULAR | Status: DC | PRN

## 2020-04-02 MED ORDER — SODIUM CHLORIDE 0.9 % IV SOLN: INTRAVENOUS | Status: DC | PRN

## 2020-04-02 MED ORDER — ALBUTEROL SULFATE HFA 108 (90 BASE) MCG/ACT IN AERS: 2.0000 | INHALATION_SPRAY | Freq: Once | RESPIRATORY_TRACT | Status: DC | PRN

## 2020-04-02 MED ORDER — SODIUM CHLORIDE 0.9 % IV SOLN
Freq: Once | INTRAVENOUS | Status: AC
  Filled 2020-04-02: qty 20

## 2020-04-02 MED ORDER — ONDANSETRON HCL 8 MG PO TABS: 8.0000 mg | ORAL_TABLET | Freq: Three times a day (TID) | ORAL | 0 refills | Status: DC | PRN

## 2020-04-02 MED ORDER — FAMOTIDINE IN NACL 20-0.9 MG/50ML-% IV SOLN: 20.0000 mg | Freq: Once | INTRAVENOUS | Status: DC | PRN

## 2020-04-02 MED ORDER — EPINEPHRINE 0.3 MG/0.3ML IJ SOAJ: 0.3000 mg | Freq: Once | INTRAMUSCULAR | Status: DC | PRN

## 2020-04-02 MED ORDER — DIPHENHYDRAMINE HCL 50 MG/ML IJ SOLN: 50.0000 mg | Freq: Once | INTRAMUSCULAR | Status: DC | PRN

## 2020-04-02 NOTE — Discharge Instructions (Signed)

## 2020-04-02 NOTE — Progress Notes (Signed)
  Diagnosis: COVID-19  Physician:Dr Joya Gaskins  Procedure: [facility] Med: bamlanivimab\etesevimab infusion - Provided patient with bamlanimivab\etesevimab fact sheet for patients, parents and caregivers prior to infusion.  Complications: No immediate complications noted.  Discharge: Discharged home   [place], [place] 04/02/2020

## 2020-04-23 DIAGNOSIS — N5201 Erectile dysfunction due to arterial insufficiency: Secondary | ICD-10-CM | POA: Diagnosis not present

## 2020-04-23 DIAGNOSIS — N401 Enlarged prostate with lower urinary tract symptoms: Secondary | ICD-10-CM | POA: Diagnosis not present

## 2020-04-23 DIAGNOSIS — E291 Testicular hypofunction: Secondary | ICD-10-CM | POA: Diagnosis not present

## 2020-04-23 DIAGNOSIS — R351 Nocturia: Secondary | ICD-10-CM | POA: Diagnosis not present

## 2020-05-21 ENCOUNTER — Other Ambulatory Visit: Payer: Self-pay | Admitting: Internal Medicine

## 2020-05-21 NOTE — Telephone Encounter (Signed)
Please refill as per office routine med refill policy (all routine meds refilled for 3 mo or monthly per pt preference up to one year from last visit, then month to month grace period for 3 mo, then further med refills will have to be denied)  

## 2020-05-29 ENCOUNTER — Telehealth: Payer: Self-pay | Admitting: Internal Medicine

## 2020-05-29 DIAGNOSIS — R9389 Abnormal findings on diagnostic imaging of other specified body structures: Secondary | ICD-10-CM

## 2020-05-29 NOTE — Telephone Encounter (Signed)
  Patient wants to know if he needs to have a carotid scan this year

## 2020-05-29 NOTE — Telephone Encounter (Signed)
Ok this is ordered 

## 2020-06-04 ENCOUNTER — Telehealth: Payer: Self-pay | Admitting: Orthopaedic Surgery

## 2020-06-04 NOTE — Telephone Encounter (Signed)
Patient called. He would like to get gel injections. His call back number is [phone]

## 2020-06-05 ENCOUNTER — Ambulatory Visit: Payer: Medicare Other | Admitting: Orthopaedic Surgery

## 2020-06-05 NOTE — Telephone Encounter (Signed)
I called and sw pt and sch appt for 06/11/20 at 1:45

## 2020-06-05 NOTE — Telephone Encounter (Signed)
This pt was bilateral monovisc injections 08/2019 and wants repeat injections. Is that ok or does pt need to come in office first?

## 2020-06-05 NOTE — Telephone Encounter (Signed)
He would need to come into the office for an updated appointment.

## 2020-06-11 ENCOUNTER — Telehealth: Payer: Self-pay

## 2020-06-11 ENCOUNTER — Ambulatory Visit: Payer: Medicare PPO | Admitting: Orthopaedic Surgery

## 2020-06-11 ENCOUNTER — Other Ambulatory Visit: Payer: Self-pay

## 2020-06-11 DIAGNOSIS — M170 Bilateral primary osteoarthritis of knee: Secondary | ICD-10-CM | POA: Diagnosis not present

## 2020-06-11 MED ORDER — LIDOCAINE HCL 1 % IJ SOLN
3.0000 mL | INTRAMUSCULAR | Status: AC | PRN
  Administered 2020-06-11: 15:00:00 3 mL

## 2020-06-11 MED ORDER — METHYLPREDNISOLONE ACETATE 40 MG/ML IJ SUSP
13.3300 mg | INTRAMUSCULAR | Status: AC | PRN
  Administered 2020-06-11: 15:00:00 13.33 mg via INTRA_ARTICULAR

## 2020-06-11 MED ORDER — DICLOFENAC SODIUM 75 MG PO TBEC: 75.0000 mg | DELAYED_RELEASE_TABLET | Freq: Two times a day (BID) | ORAL | 0 refills | Status: DC | PRN

## 2020-06-11 MED ORDER — BUPIVACAINE HCL 0.25 % IJ SOLN
0.6600 mL | INTRAMUSCULAR | Status: AC | PRN
  Administered 2020-06-11: 15:00:00 .66 mL via INTRA_ARTICULAR

## 2020-06-11 NOTE — Progress Notes (Signed)
Office Visit Note   Patient: Warren Weeks           Date of Birth: [birth year]           MRN: [id number] Visit Date: 06/11/2020              Requested by: Biagio Borg, MD [place],  [place] [id number] PCP: Biagio Borg, MD   Assessment & Plan: Visit Diagnoses:  1. Bilateral primary osteoarthritis of knee     Plan: Impression is bilateral knee degenerative joint disease.  Today, we discussed repeat cortisone injections as well as getting approval for viscosupplementation injections.  The patient would like to proceed.  He will follow up with [place] once approved for [place] injections. This patient is diagnosed with osteoarthritis of the knee(s).    Radiographs show evidence of joint space narrowing, osteophytes, subchondral sclerosis and/or subchondral cysts.  This patient has knee pain which interferes with functional and activities of daily living.    This patient has experienced inadequate response, adverse effects and/or intolerance with conservative treatments such as acetaminophen, NSAIDS, topical creams, physical therapy or regular exercise, knee bracing and/or weight loss.   This patient has experienced inadequate response or has a contraindication to intra articular steroid injections for at least 3 months.   This patient is not scheduled to have a total knee replacement within 6 months of starting treatment with viscosupplementation.  Follow-Up Instructions: Return for after bilateral gel approval.   Orders:  Orders Placed This Encounter  Procedures  . Large Joint Inj: bilateral knee   Meds ordered this encounter  Medications  . diclofenac (VOLTAREN) 75 MG EC tablet    Sig: Take 1 tablet (75 mg total) by mouth 2 (two) times daily as needed.    Dispense:  180 tablet    Refill:  0      Procedures: Large Joint Inj: bilateral knee on 06/11/2020 3:00 PM Indications: pain Details: 22 G needle, anterolateral approach Medications (Right): 0.66 mL  bupivacaine 0.25 %; 3 mL lidocaine 1 %; 13.33 mg methylPREDNISolone acetate 40 MG/ML Medications (Left): 0.66 mL bupivacaine 0.25 %; 3 mL lidocaine 1 %; 13.33 mg methylPREDNISolone acetate 40 MG/ML      Clinical Data: No additional findings.   Subjective: Chief Complaint  Patient presents with  . Left Knee - Follow-up, Pain  . Right Knee - Follow-up, Pain    HPI a pleasant 74 year old gentleman comes in today with bilateral knee pain left greater than right.  History of advanced degenerative joint disease both knees.  He was seen by [place] back in March of both knees were injected with hyaluronic acid.  He had great relief until recently.  His right knee is actually more bothersome today.  He describes this as a constant throb worse to the medial aspect.  He has pain with ambulation as well as pain at night while trying to sleep.  He takes diclofenac as needed.  Review of Systems as detailed in HPI.  All others reviewed and are negative.   Objective: Vital Signs: There were no vitals taken for this visit.  Physical Exam well-developed well-nourished gentleman in no acute distress.  Alert oriented x3.  Ortho Exam bilateral knee exam shows trace effusion on the right.  No effusion on the left.  Range of motion 0 to 100 degrees.  Medial and lateral joint line tenderness.  Ligaments are stable.  He is neurovascular intact distally.  Specialty Comments:  No specialty comments  available.  Imaging: No new imaging   PMFS History: Patient Active Problem List   Diagnosis Date Noted  . PVC's (premature ventricular contractions) 03/02/2020  . BPH (benign prostatic hyperplasia) 03/02/2020  . Arthritis 03/02/2020  . Diastolic dysfunction [id number]  . Urinary frequency 09/14/2019  . Hoarseness of voice 12/07/2018  . Urinary leakage 12/07/2018  . Left knee pain 12/07/2018  . Chronic sinusitis 10/05/2018  . 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 COPD (chronic obstructive pulmonary disease)  ([place]) 05/12/2017  . Pleurisy 03/16/2017  . Bilateral leg pain 11/22/2016  . Insomnia 10/13/2016  . Cough 07/22/2016  . Wheezing 07/22/2016  . Peripheral edema 02/21/2016  . Venous insufficiency 02/05/2016  . Low back pain radiating to lower extremity 02/05/2016  . Vertigo   . Hypersomnolence 10/24/2015  . 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 Chest pain 01/18/2013  . Hematochezia 10/12/2012  . Viral illness 05/26/2012  . Palpitations 01/05/2012  . 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 Fatigue 04/09/2011  . Rash 12/22/2010  . Encounter for long-term (current) use of high-risk medication 10/08/2010  . Impaired glucose tolerance 10/04/2010  . Encounter for well adult exam with abnormal findings 10/04/2010  . Carotid artery stenosis 07/01/2009  . PVD 06/25/2009  . Allergic rhinitis 09/16/2007  . ESOPHAGEAL STRICTURE 09/16/2007  . COLONIC POLYPS, HX OF 09/16/2007  . Anxiety state 03/28/2007  . ERECTILE DYSFUNCTION 03/28/2007  . Hyperlipidemia 03/28/2007  . Essential hypertension 03/28/2007  . LOW BACK PAIN 03/28/2007  . OBESITY 03/24/2007  . HEMORRHOIDS, INTERNAL 03/24/2007  . ESOPHAGITIS 03/24/2007  . GERD 03/24/2007   Past Medical History:  Diagnosis Date  . ALLERGIC RHINITIS 09/16/2007  . ALLERGY 03/24/2007  . Allergy   . ANXIETY 03/28/2007  . Arthritis    knees, back,shoulder  . CAROTID ARTERY STENOSIS, BILATERAL 07/01/2009   yearly checks - no current problems  . CEPHALGIA 06/08/2009  . CHEST PAIN 09/16/2007   no current problems  . COLONIC POLYPS, HX OF 09/16/2007  . COPD (chronic obstructive pulmonary disease) ([place]) 05/12/2017   mild per patient - no inhaler  . Dizziness and giddiness 06/08/2009  . 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 DYSPNEA/SHORTNESS OF BREATH 09/16/2007  . ERECTILE DYSFUNCTION 03/28/2007  . ESOPHAGEAL STRICTURE 09/16/2007  . ESOPHAGITIS 03/24/2007  . GERD 03/24/2007  . GLUCOSE INTOLERANCE 09/16/2007  . HEMORRHOIDS, INTERNAL 03/24/2007  .  HYPERCHOLESTEROLEMIA 03/24/2007  . HYPERLIPIDEMIA 09/16/2007  . HYPERTENSION 03/28/2007  . 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 Impaired glucose tolerance 10/04/2010  . LOW BACK PAIN 03/28/2007  . MAGNETIC RESONANCE IMAGING, BRAIN, ABNORMAL 06/26/2009  . NECK PAIN, LEFT 06/20/2010  . OBESITY 03/24/2007  . OTITIS MEDIA, LEFT 06/10/2009  . Palpitations 06/20/2010   no current problems  . PVD 06/25/2009  . RASH-NONVESICULAR 06/17/2009  . URI 08/16/2009    Family History  Problem Relation Age of Onset  . Lymphoma Mother   . Heart disease Father        CHF  . Ovarian cancer Sister   . Lung cancer Sister   . Coronary artery disease Sister        CABG  . Coronary artery disease Brother        stent, and CAD  . Colon cancer Neg Hx   . Esophageal cancer Neg Hx   . Rectal cancer Neg Hx   . Stomach cancer Neg Hx   . Colon polyps Neg Hx     Past Surgical History:  Procedure Laterality Date  . COLONOSCOPY  03/2013   polyps/[place]  .  s/p right knee arthroscopy    . UPPER GASTROINTESTINAL ENDOSCOPY     [facility]   Social History   Occupational History  . Occupation: ship and Physiological scientist: [employer]  Tobacco Use  . Smoking status: Former Smoker    Packs/day: 0.50    Years: 15.00    Pack years: 7.50    Types: Cigarettes  . Smokeless tobacco: Never Used  . Tobacco comment: Quit 30 years ago  Vaping Use  . Vaping Use: Never used  Substance and Sexual Activity  . Alcohol use: Yes    Alcohol/week: 7.0 standard drinks    Types: 7 Cans of beer per week    Comment: beer most days  . Drug use: No  . Sexual activity: Yes

## 2020-06-11 NOTE — Telephone Encounter (Signed)
Noted. Xu patient 

## 2020-06-11 NOTE — Telephone Encounter (Signed)
Please get auth for bilateral gel injections. Pt understands it will be after 1/4 that this will be worked on

## 2020-06-20 ENCOUNTER — Other Ambulatory Visit: Payer: Self-pay

## 2020-06-20 ENCOUNTER — Ambulatory Visit (HOSPITAL_COMMUNITY)
Admission: RE | Admit: 2020-06-20 | Discharge: 2020-06-20 | Disposition: A | Discharge status: Home / Self Care | Payer: Medicare PPO | Source: Ambulatory Visit | Attending: Cardiology | Admitting: Cardiology

## 2020-06-20 ENCOUNTER — Encounter: Payer: Self-pay | Admitting: Internal Medicine

## 2020-06-20 DIAGNOSIS — R9389 Abnormal findings on diagnostic imaging of other specified body structures: Secondary | ICD-10-CM | POA: Diagnosis not present

## 2020-06-20 DIAGNOSIS — I6523 Occlusion and stenosis of bilateral carotid arteries: Secondary | ICD-10-CM

## 2020-06-25 ENCOUNTER — Other Ambulatory Visit: Payer: Self-pay | Admitting: Physician Assistant

## 2020-06-25 ENCOUNTER — Telehealth: Payer: Self-pay | Admitting: Orthopaedic Surgery

## 2020-06-25 MED ORDER — COLCHICINE 0.6 MG PO TABS
ORAL_TABLET | ORAL | 2 refills | Status: DC
Start: 2020-06-25 — End: 2021-10-09

## 2020-06-25 MED ORDER — PREDNISONE 10 MG (21) PO TBPK: ORAL_TABLET | ORAL | 0 refills | Status: DC

## 2020-06-25 NOTE — Telephone Encounter (Signed)
Pt called and informed.

## 2020-06-25 NOTE — Telephone Encounter (Signed)
Pt called stating his left knee is feeling good and now his right knee is bothering him waking him up at night and feeling warm and tender to the touch..  Pt would like to know if Dr. Roda Shutters can prescribe him some gout medication.

## 2020-06-25 NOTE — Telephone Encounter (Signed)
Sent in

## 2020-06-25 NOTE — Telephone Encounter (Signed)
Has he ever had gout  before? I cannot see where we have seen him for this.  Any fever or chills?

## 2020-06-25 NOTE — Telephone Encounter (Signed)
Called pt. No fever or chills. Has hx of gout in that knee. Previously treated for it by dr. Prince Rome

## 2020-07-01 ENCOUNTER — Telehealth: Payer: Self-pay | Admitting: Orthopaedic Surgery

## 2020-07-01 ENCOUNTER — Telehealth: Payer: Self-pay

## 2020-07-01 NOTE — Telephone Encounter (Signed)
Patient would like a Rx refill on Prednisone and stated that diclofenac is not helping.  CB# [phone].  Please advise.  Thank you

## 2020-07-01 NOTE — Telephone Encounter (Signed)
Please advise on status of injections

## 2020-07-01 NOTE — Telephone Encounter (Signed)
Please advise 

## 2020-07-01 NOTE — Telephone Encounter (Signed)
Pt called stating he needs a refill of his prednisone and he would like that called in today as his legs are in so much pain he can't walk.Pt also would like to know the status of his gel injections being approved   [phone]

## 2020-07-01 NOTE — Telephone Encounter (Signed)
Talked with patient concerning status of gel injection. Message sent for Rx refill for Prednisone.

## 2020-07-02 NOTE — Telephone Encounter (Signed)
If still hurting with gout medicine and prednisone pack, he should probably come in to be seen

## 2020-07-02 NOTE — Telephone Encounter (Signed)
I called patient and advised, follow up appt made

## 2020-07-03 ENCOUNTER — Encounter: Payer: Self-pay | Admitting: Orthopaedic Surgery

## 2020-07-03 ENCOUNTER — Other Ambulatory Visit: Payer: Self-pay | Admitting: Internal Medicine

## 2020-07-03 ENCOUNTER — Other Ambulatory Visit: Payer: Self-pay | Admitting: Physician Assistant

## 2020-07-03 ENCOUNTER — Ambulatory Visit: Payer: Medicare PPO | Admitting: Orthopaedic Surgery

## 2020-07-03 DIAGNOSIS — M1711 Unilateral primary osteoarthritis, right knee: Secondary | ICD-10-CM | POA: Diagnosis not present

## 2020-07-03 MED ORDER — TRAMADOL HCL 50 MG PO TABS: 50.0000 mg | ORAL_TABLET | Freq: Three times a day (TID) | ORAL | 0 refills | Status: DC | PRN

## 2020-07-03 MED ORDER — BUPIVACAINE HCL 0.5 % IJ SOLN
2.0000 mL | INTRAMUSCULAR | Status: AC | PRN
  Administered 2020-07-03: 2 mL via INTRA_ARTICULAR

## 2020-07-03 MED ORDER — LIDOCAINE HCL 1 % IJ SOLN
2.0000 mL | INTRAMUSCULAR | Status: AC | PRN
  Administered 2020-07-03: 2 mL

## 2020-07-03 MED ORDER — PREDNISONE 10 MG (21) PO TBPK: ORAL_TABLET | ORAL | 0 refills | Status: DC

## 2020-07-03 NOTE — Progress Notes (Signed)
Office Visit Note   Patient: Warren Weeks           Date of Birth: Mar 20, 1946           MRN: [id number] Visit Date: 07/03/2020              Requested by: Biagio Borg, MD [place],  [place] [address] PCP: Biagio Borg, MD   Assessment & Plan: Visit Diagnoses:  1. Unilateral primary osteoarthritis, right knee     Plan: Impression is right knee gouty attack with underlying advanced degenerative joint disease.  Today, I aspirated approximately 10 cc of blood-tinged fluid from the right knee.  At this point, I will call in another steroid pack and have the patient pick up the colchicine.  We will also obtain uric acid levels and send the aspirate off for cell count, culture and crystals.  He will call us with any concerns or questions.  Follow-Up Instructions: Return if symptoms worsen or fail to improve.   Orders:  Orders Placed This Encounter  Procedures  . Anaerobic and Aerobic Culture  . Uric acid  . Cell count + diff,  w/ cryst-synvl fld   No orders of the defined types were placed in this encounter.     Procedures: Large Joint Inj: R knee on 07/03/2020 3:15 PM Indications: pain Details: 22 G needle  Arthrogram: No  Medications: 2 mL lidocaine 1 %; 2 mL bupivacaine 0.5 % Consent was given by the patient. Patient was prepped and draped in the usual sterile fashion.       Clinical Data: No additional findings.   Subjective: Chief Complaint  Patient presents with  . Right Knee - Pain  . Left Knee - Pain    HPI pleasant 75 year old gentleman who comes in today with increased pain to the right knee.  He has a history of advanced degenerative joint disease to both knees.  He was recently seen by me for this where both knees were injected with cortisone.  He had great relief for about a day.  His symptoms to the right have returned and significantly worsened last week.  No new injury or change in activity.  The pain he has is to the entire knee and  is worse with ambulation.  He notes that he used quite a bit of ice last night which did seem to help.  He also notes that he is had warmth to the right knee.  No fevers or chills or any other systemic symptoms.  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  Prednisone as well as colchicine were called in last week but he only picked up the prednisone which seemed to minimally help.  Review of Systems as detailed in HPI.  All others reviewed and are negative.   Objective: Vital Signs: There were no vitals taken for this visit.  Physical Exam well-developed well-nourished gentleman in no acute distress.  Alert and oriented x3.  Ortho Exam right knee exam shows a trace effusion.  Range of motion from 0 to 100 degrees.  He does have slight medial joint line tenderness.  Moderate patellofemoral crepitus.  Ligaments are stable.  He has slight warmth to touch as well.  He is neurovascular intact distally.  Specialty Comments:  No specialty comments available.  Imaging: No new imaging   PMFS History: Patient Active Problem List   Diagnosis Date Noted  . PVC's (premature ventricular contractions) 03/02/2020  .  BPH (benign prostatic hyperplasia) 03/02/2020  . Arthritis 03/02/2020  . Diastolic dysfunction 14/43/1540  . Urinary frequency 09/14/2019  . Hoarseness of voice 12/07/2018  . Urinary leakage 12/07/2018  . Left knee pain 12/07/2018  . Chronic sinusitis 10/05/2018  . 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 COPD (chronic obstructive pulmonary disease) ([place]) 05/12/2017  . Pleurisy 03/16/2017  . Bilateral leg pain 11/22/2016  . Insomnia 10/13/2016  . Cough 07/22/2016  . Wheezing 07/22/2016  . Peripheral edema 02/21/2016  . Venous insufficiency 02/05/2016  . Low back pain radiating to lower extremity 02/05/2016  . Vertigo   . Hypersomnolence 10/24/2015  . 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 Chest pain 01/18/2013  . Hematochezia 10/12/2012  . Viral illness 05/26/2012   . Palpitations 01/05/2012  . 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 Fatigue 04/09/2011  . Rash 12/22/2010  . Encounter for long-term (current) use of high-risk medication 10/08/2010  . Impaired glucose tolerance 10/04/2010  . Encounter for well adult exam with abnormal findings 10/04/2010  . Carotid artery stenosis 07/01/2009  . PVD 06/25/2009  . Allergic rhinitis 09/16/2007  . ESOPHAGEAL STRICTURE 09/16/2007  . COLONIC POLYPS, HX OF 09/16/2007  . Anxiety state 03/28/2007  . ERECTILE DYSFUNCTION 03/28/2007  . Hyperlipidemia 03/28/2007  . Essential hypertension 03/28/2007  . LOW BACK PAIN 03/28/2007  . OBESITY 03/24/2007  . HEMORRHOIDS, INTERNAL 03/24/2007  . ESOPHAGITIS 03/24/2007  . GERD 03/24/2007   Past Medical History:  Diagnosis Date  . ALLERGIC RHINITIS 09/16/2007  . ALLERGY 03/24/2007  . Allergy   . ANXIETY 03/28/2007  . Arthritis    knees, back,shoulder  . CAROTID ARTERY STENOSIS, BILATERAL 07/01/2009   yearly checks - no current problems  . CEPHALGIA 06/08/2009  . CHEST PAIN 09/16/2007   no current problems  . COLONIC POLYPS, HX OF 09/16/2007  . COPD (chronic obstructive pulmonary disease) ([place]) 05/12/2017   mild per patient - no inhaler  . Dizziness and giddiness 06/08/2009  . 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 DYSPNEA/SHORTNESS OF BREATH 09/16/2007  . ERECTILE DYSFUNCTION 03/28/2007  . ESOPHAGEAL STRICTURE 09/16/2007  . ESOPHAGITIS 03/24/2007  . GERD 03/24/2007  . GLUCOSE INTOLERANCE 09/16/2007  . HEMORRHOIDS, INTERNAL 03/24/2007  . HYPERCHOLESTEROLEMIA 03/24/2007  . HYPERLIPIDEMIA 09/16/2007  . HYPERTENSION 03/28/2007  . 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 Impaired glucose tolerance 10/04/2010  . LOW BACK PAIN 03/28/2007  . MAGNETIC RESONANCE IMAGING, BRAIN, ABNORMAL 06/26/2009  . NECK PAIN, LEFT 06/20/2010  . OBESITY 03/24/2007  . OTITIS MEDIA, LEFT 06/10/2009  . Palpitations 06/20/2010   no current problems  . PVD 06/25/2009  . RASH-NONVESICULAR 06/17/2009  . URI 08/16/2009     Family History  Problem Relation Age of Onset  . Lymphoma Mother   . Heart disease Father        CHF  . Ovarian cancer Sister   . Lung cancer Sister   . Coronary artery disease Sister        CABG  . Coronary artery disease Brother        stent, and CAD  . Colon cancer Neg Hx   . Esophageal cancer Neg Hx   . Rectal cancer Neg Hx   . Stomach cancer Neg Hx   . Colon polyps Neg Hx     Past Surgical History:  Procedure Laterality Date  . COLONOSCOPY  03/2013   polyps/[place]  . s/p right knee arthroscopy    . UPPER GASTROINTESTINAL ENDOSCOPY     gerd   Social History   Occupational History  . Occupation: ship  and receive manager    Employer: [employer]  Tobacco Use  . Smoking status: Former Smoker    Packs/day: 0.50    Years: 15.00    Pack years: 7.50    Types: Cigarettes  . Smokeless tobacco: Never Used  . Tobacco comment: Quit 30 years ago  Vaping Use  . Vaping Use: Never used  Substance and Sexual Activity  . Alcohol use: Yes    Alcohol/week: 7.0 standard drinks    Types: 7 Cans of beer per week    Comment: beer most days  . Drug use: No  . Sexual activity: Yes

## 2020-07-03 NOTE — Telephone Encounter (Signed)
Please refill as per office routine med refill policy (all routine meds refilled for 3 mo or monthly per pt preference up to one year from last visit, then month to month grace period for 3 mo, then further med refills will have to be denied)  

## 2020-07-04 ENCOUNTER — Telehealth: Payer: Self-pay | Admitting: Orthopaedic Surgery

## 2020-07-04 LAB — URIC ACID: Uric Acid, Serum: 5.2 mg/dL (ref 4.0–8.0)

## 2020-07-04 NOTE — Telephone Encounter (Signed)
Pt called and said [facility] was out of tramadol and don't know when they're getting it back but he called [facility] on [address] of [address] and they have it so he was wondering if you can send a prescription there

## 2020-07-04 NOTE — Telephone Encounter (Signed)
Can you send this into [facility] instead

## 2020-07-05 ENCOUNTER — Other Ambulatory Visit: Payer: Self-pay | Admitting: Physician Assistant

## 2020-07-05 ENCOUNTER — Encounter: Payer: Self-pay | Admitting: Internal Medicine

## 2020-07-05 ENCOUNTER — Ambulatory Visit: Payer: Medicare PPO | Admitting: Internal Medicine

## 2020-07-05 ENCOUNTER — Other Ambulatory Visit: Payer: Self-pay

## 2020-07-05 ENCOUNTER — Ambulatory Visit (HOSPITAL_COMMUNITY)
Admission: RE | Admit: 2020-07-05 | Discharge: 2020-07-05 | Disposition: A | Discharge status: Home / Self Care | Payer: Medicare PPO | Source: Ambulatory Visit | Attending: Cardiovascular Disease | Admitting: Cardiovascular Disease

## 2020-07-05 VITALS — BP 144/82 | HR 67 | Temp 98.2°F | Ht 69.0 in | Wt 252.0 lb

## 2020-07-05 DIAGNOSIS — E538 Deficiency of other specified B group vitamins: Secondary | ICD-10-CM | POA: Diagnosis not present

## 2020-07-05 DIAGNOSIS — M79661 Pain in right lower leg: Secondary | ICD-10-CM

## 2020-07-05 DIAGNOSIS — M25561 Pain in right knee: Secondary | ICD-10-CM | POA: Diagnosis not present

## 2020-07-05 DIAGNOSIS — R7302 Impaired glucose tolerance (oral): Secondary | ICD-10-CM | POA: Diagnosis not present

## 2020-07-05 DIAGNOSIS — E7849 Other hyperlipidemia: Secondary | ICD-10-CM

## 2020-07-05 DIAGNOSIS — N3281 Overactive bladder: Secondary | ICD-10-CM | POA: Insufficient documentation

## 2020-07-05 DIAGNOSIS — E559 Vitamin D deficiency, unspecified: Secondary | ICD-10-CM | POA: Diagnosis not present

## 2020-07-05 DIAGNOSIS — M7989 Other specified soft tissue disorders: Secondary | ICD-10-CM | POA: Diagnosis not present

## 2020-07-05 DIAGNOSIS — G8929 Other chronic pain: Secondary | ICD-10-CM

## 2020-07-05 DIAGNOSIS — M542 Cervicalgia: Secondary | ICD-10-CM | POA: Diagnosis not present

## 2020-07-05 DIAGNOSIS — Z0001 Encounter for general adult medical examination with abnormal findings: Secondary | ICD-10-CM | POA: Diagnosis not present

## 2020-07-05 DIAGNOSIS — I10 Essential (primary) hypertension: Secondary | ICD-10-CM

## 2020-07-05 LAB — URINALYSIS, ROUTINE W REFLEX MICROSCOPIC
Bilirubin Urine: NEGATIVE
Hgb urine dipstick: NEGATIVE
Ketones, ur: NEGATIVE
Leukocytes,Ua: NEGATIVE
Nitrite: NEGATIVE
Specific Gravity, Urine: >=1.03 — AB (ref 1.000–1.030)
Total Protein, Urine: NEGATIVE
Urine Glucose: NEGATIVE
Urobilinogen, UA: 0.2 (ref 0.0–1.0)
WBC, UA: NONE SEEN (ref 0–?)
pH: 5.5 (ref 5.0–8.0)

## 2020-07-05 LAB — VITAMIN B12: Vitamin B-12: 1018 pg/mL — ABNORMAL HIGH (ref 211–911)

## 2020-07-05 LAB — CBC WITH DIFFERENTIAL/PLATELET
Basophils Absolute: 0 10*3/uL (ref 0.0–0.1)
Basophils Relative: 0.1 % (ref 0.0–3.0)
Eosinophils Absolute: 0 10*3/uL (ref 0.0–0.7)
Eosinophils Relative: 0 % (ref 0.0–5.0)
HCT: 43.8 % (ref 39.0–52.0)
Hemoglobin: 14.6 g/dL (ref 13.0–17.0)
Lymphocytes Relative: 11 % — ABNORMAL LOW (ref 12.0–46.0)
Lymphs Abs: 0.9 10*3/uL (ref 0.7–4.0)
MCHC: 33.4 g/dL (ref 30.0–36.0)
MCV: 91.3 fl (ref 78.0–100.0)
Monocytes Absolute: 0.3 10*3/uL (ref 0.1–1.0)
Monocytes Relative: 4.2 % (ref 3.0–12.0)
Neutro Abs: 6.8 10*3/uL (ref 1.4–7.7)
Neutrophils Relative %: 84.7 % — ABNORMAL HIGH (ref 43.0–77.0)
Platelets: 182 10*3/uL (ref 150.0–400.0)
RBC: 4.8 Mil/uL (ref 4.22–5.81)
RDW: 14.7 % (ref 11.5–15.5)
WBC: 8 10*3/uL (ref 4.0–10.5)

## 2020-07-05 LAB — BASIC METABOLIC PANEL
BUN: 16 mg/dL (ref 6–23)
CO2: 28 mEq/L (ref 19–32)
Calcium: 9.6 mg/dL (ref 8.4–10.5)
Chloride: 103 mEq/L (ref 96–112)
Creatinine, Ser: 0.9 mg/dL (ref 0.40–1.50)
GFR: 85.57 mL/min (ref >60.00)
Glucose, Bld: 162 mg/dL — ABNORMAL HIGH (ref 70–99)
Potassium: 4.7 mEq/L (ref 3.5–5.1)
Sodium: 140 mEq/L (ref 135–145)

## 2020-07-05 LAB — LIPID PANEL
Cholesterol: 173 mg/dL (ref 0–200)
HDL: 69.8 mg/dL (ref >39.00)
LDL Cholesterol: 92 mg/dL (ref 0–99)
NonHDL: 103.5
Total CHOL/HDL Ratio: 2
Triglycerides: 58 mg/dL (ref 0.0–149.0)
VLDL: 11.6 mg/dL (ref 0.0–40.0)

## 2020-07-05 LAB — HEPATIC FUNCTION PANEL
ALT: 72 U/L — ABNORMAL HIGH (ref 0–53)
AST: 26 U/L (ref 0–37)
Albumin: 4.5 g/dL (ref 3.5–5.2)
Alkaline Phosphatase: 74 U/L (ref 39–117)
Bilirubin, Direct: 0.1 mg/dL (ref 0.0–0.3)
Total Bilirubin: 0.6 mg/dL (ref 0.2–1.2)
Total Protein: 7.1 g/dL (ref 6.0–8.3)

## 2020-07-05 LAB — PSA: PSA: 0.13 ng/mL (ref 0.10–4.00)

## 2020-07-05 LAB — TSH: TSH: 0.65 u[IU]/mL (ref 0.35–4.50)

## 2020-07-05 LAB — HEMOGLOBIN A1C: Hgb A1c MFr Bld: 6.3 % (ref 4.6–6.5)

## 2020-07-05 LAB — URIC ACID: Uric Acid, Serum: 4.5 mg/dL (ref 4.0–7.8)

## 2020-07-05 LAB — VITAMIN D 25 HYDROXY (VIT D DEFICIENCY, FRACTURES): VITD: 39.4 ng/mL (ref 30.00–100.00)

## 2020-07-05 MED ORDER — TRAMADOL HCL 50 MG PO TABS: 50.0000 mg | ORAL_TABLET | Freq: Three times a day (TID) | ORAL | 0 refills | Status: DC | PRN

## 2020-07-05 MED ORDER — SOLIFENACIN SUCCINATE 5 MG PO TABS: 5.0000 mg | ORAL_TABLET | Freq: Every day | ORAL | 3 refills | Status: AC

## 2020-07-05 NOTE — Telephone Encounter (Signed)
Sent in

## 2020-07-05 NOTE — Patient Instructions (Signed)
You will be contacted regarding the referral for: right leg venous doppler to rule out blood clot (to see [facility] now)  Please take all new medication as prescribed - vesicare for the overactive bladder  Please continue all other medications as before, and refills have been done if requested.  Please have the pharmacy call with any other refills you may need.  Please continue your efforts at being more active, low cholesterol diet, and weight control.  You are otherwise up to date with prevention measures today.  Please keep your appointments with your specialists as you may have planned - urology on feb 14, and orthopedic for the gel shot for the right knee  Please go to the LAB at the blood drawing area for the tests to be done  You will be contacted by phone if any changes need to be made immediately.  Otherwise, you will receive a letter about your results with an explanation, but please check with MyChart first.  Please remember to sign up for MyChart if you have not done so, as this will be important to you in the future with finding out test results, communicating by private email, and scheduling acute appointments online when needed.  Please make an Appointment to return in 3 months

## 2020-07-05 NOTE — Telephone Encounter (Signed)
Called patient no answer. LMOM.   Rx sent into pharm.  

## 2020-07-05 NOTE — Progress Notes (Signed)
Established Patient Office Visit  Subjective:  Patient ID: Warren Weeks, male    DOB: 10-04-1945  Age: 75 y.o. MRN: [id number]       Chief Complaint:: wellness exam and HTN, HLD, hyperglycemia, right leg pain and swelling, bilat post lower neck pain, right knee pain and swelling, and urinary frequency       HPI:  Warren Weeks is a 75 y.o. male here for wellness exam       Wt Readings from Last 3 Encounters:  07/05/20 252 lb (114.3 kg)  02/28/20 251 lb (113.9 kg)  02/14/20 250 lb (113.4 kg)   BP Readings from Last 3 Encounters:  07/05/20 (!) 144/82  04/02/20 (!) 185/87  02/28/20 130/62        Also c/o acute problem of: S/p covid infection approx 3 mo, just had booster shot as well. Knees getting worse overall, walks with cane now, just saw ortho last wk, neg for infectoin and gout, will need right TKR relatively soon for ongoing pain and swelling . He has been holding on the surgury so he can drive the grandkids when they need and use the right leg, but needs further gel shots first. The one last year seemed to work ok.  Does have 1 mo  distal RLE swelling and wondering about heart and lung issue, also blood clots.  Has been holding off on hct recently due to urinary frequency small amounts and nocturia 5 times per night, last seen per urology 2 mo ago and told prostate ok, and next appt due feb 14.  Also has bilateral post lower neck pain without radiation, mild to mod, sharp, worse to turn head left and right horizontally.  Pt notes BP at home < 140/90  Past Medical History:  Diagnosis Date  . ALLERGIC RHINITIS 09/16/2007  . ALLERGY 03/24/2007  . Allergy   . ANXIETY 03/28/2007  . Arthritis    knees, back,shoulder  . CAROTID ARTERY STENOSIS, BILATERAL 07/01/2009   yearly checks - no current problems  . CEPHALGIA 06/08/2009  . CHEST PAIN 09/16/2007   no current problems  . COLONIC POLYPS, HX OF 09/16/2007  . COPD (chronic obstructive pulmonary disease) ([place]) 05/12/2017   mild per  patient - no inhaler  . Dizziness and giddiness 06/08/2009  . 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 DYSPNEA/SHORTNESS OF BREATH 09/16/2007  . ERECTILE DYSFUNCTION 03/28/2007  . ESOPHAGEAL STRICTURE 09/16/2007  . ESOPHAGITIS 03/24/2007  . GERD 03/24/2007  . GLUCOSE INTOLERANCE 09/16/2007  . HEMORRHOIDS, INTERNAL 03/24/2007  . HYPERCHOLESTEROLEMIA 03/24/2007  . HYPERLIPIDEMIA 09/16/2007  . HYPERTENSION 03/28/2007  . 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 Impaired glucose tolerance 10/04/2010  . LOW BACK PAIN 03/28/2007  . MAGNETIC RESONANCE IMAGING, BRAIN, ABNORMAL 06/26/2009  . NECK PAIN, LEFT 06/20/2010  . OBESITY 03/24/2007  . OTITIS MEDIA, LEFT 06/10/2009  . Palpitations 06/20/2010   no current problems  . PVD 06/25/2009  . RASH-NONVESICULAR 06/17/2009  . URI 08/16/2009   Past Surgical History:  Procedure Laterality Date  . COLONOSCOPY  03/2013   polyps/[place]  . s/p right knee arthroscopy    . UPPER GASTROINTESTINAL ENDOSCOPY     gerd    reports that he has quit smoking. His smoking use included cigarettes. He has a 7.50 pack-year smoking history. He has never used smokeless tobacco. He reports current alcohol use of about 7.0 standard drinks of alcohol per week. He reports that he does not use drugs. family history includes Coronary artery disease in his brother and  sister; Heart disease in his father; Lung cancer in his sister; Lymphoma in his mother; Ovarian cancer in his sister. Allergies  Allergen Reactions  . Cefuroxime Axetil Rash   Current Outpatient Medications on File Prior to Visit  Medication Sig Dispense Refill  . acetaminophen (TYLENOL) 650 MG CR tablet Take 650 mg by mouth every 8 (eight) hours as needed for pain.    Marland Kitchen aspirin 81 MG EC tablet Take 81 mg by mouth daily.    . colchicine 0.6 MG tablet Take two pills on day one, and then one pill bid until symptoms resolve, but do not take longer than 3 days 8 tablet 2  . Cyanocobalamin (B-12 PO) Take 1 tablet by mouth daily.    .  diclofenac (VOLTAREN) 75 MG EC tablet Take 1 tablet (75 mg total) by mouth 2 (two) times daily as needed. 180 tablet 0  . ezetimibe (ZETIA) 10 MG tablet Take 1 tablet by mouth once daily 90 tablet 3  . hydrochlorothiazide (HYDRODIURIL) 25 MG tablet Take 1 tablet (25 mg total) by mouth daily. 90 tablet 3  . lisinopril (ZESTRIL) 20 MG tablet Take 1 tablet by mouth once daily 90 tablet 2  . Melatonin 10 MG TABS Take 10 mg by mouth at bedtime.     . methocarbamol (ROBAXIN) 500 MG tablet Take 1 tablet (500 mg total) by mouth 2 (two) times daily as needed. 30 tablet 1  . metoprolol tartrate (LOPRESSOR) 50 MG tablet Take 1 tablet (50 mg total) by mouth 2 (two) times daily. 180 tablet 1  . mometasone (NASONEX) 50 MCG/ACT nasal spray Place 2 sprays into the nose daily. 1 each 12  . Multiple Vitamin (ONE-A-DAY 55 PLUS PO) Take 1 tablet by mouth daily.    Marland Kitchen omeprazole (PRILOSEC) 20 MG capsule 1 tab by mouth in the am and 2 in the PM 270 capsule 3  . rosuvastatin (CRESTOR) 40 MG tablet Take 1 tablet by mouth once daily 90 tablet 0  . sildenafil (VIAGRA) 100 MG tablet Take 1 tablet (100 mg total) by mouth as needed for erectile dysfunction. 10 tablet 11  . terazosin (HYTRIN) 5 MG capsule Take 5 mg by mouth at bedtime.     . thiamine 250 MG tablet Take 250 mg by mouth daily.    Marland Kitchen tiZANidine (ZANAFLEX) 2 MG tablet Take 1 tablet (2 mg total) by mouth 2 (two) times daily as needed for muscle spasms. 30 tablet 0  . triamcinolone cream (KENALOG) 0.5 % Apply 1 application topically 3 (three) times daily. 30 g 1  . Turmeric 500 MG TABS Take by mouth daily.     No current facility-administered medications on file prior to visit.        ROS:  All others reviewed and negative.  Objective        PE:  BP (!) 144/82   Pulse 67   Temp 98.2 F (36.8 C) (Oral)   Ht 5\' 9"  (1.753 m)   Wt 252 lb (114.3 kg)   SpO2 97%   BMI 37.21 kg/m                 Constitutional: Pt appears in NAD               HENT: Head: NCAT.                 Right Ear: External ear normal.  Left Ear: External ear normal.                Eyes: . Pupils are equal, round, and reactive to light. Conjunctivae and EOM are normal               Nose: without d/c or deformity               Neck: Neck supple. Gross normal ROM, nontender posteriorly               Cardiovascular: Normal rate and regular rhythm.                 Pulmonary/Chest: Effort normal and breath sounds without rales or wheezing.                Abd:  Soft, NT, ND, + BS, no organomegaly               Neurological: Pt is alert. At baseline orientation, motor grossly intact; right knee with 2+ effusion and mild tender; RLE with 1-2+ swelling to distal leg               Skin: Skin is warm. No rashes, no other new lesions, LE edema - none               Psychiatric: Pt behavior is normal without agitation   Assessment/Plan:  LANDYN LAPRISE is a 75 y.o. Black or African American [2] male with  has a past medical history of ALLERGIC RHINITIS (09/16/2007), ALLERGY (03/24/2007), Allergy, ANXIETY (03/28/2007), Arthritis, CAROTID ARTERY STENOSIS, BILATERAL (07/01/2009), CEPHALGIA (06/08/2009), CHEST PAIN (09/16/2007), COLONIC POLYPS, HX OF (09/16/2007), COPD (chronic obstructive pulmonary disease) ([place]) (05/12/2017), Dizziness and giddiness (06/08/2009), Dyshidrotic eczema (06/03/2018), DYSPNEA/SHORTNESS OF BREATH (09/16/2007), ERECTILE DYSFUNCTION (03/28/2007), ESOPHAGEAL STRICTURE (09/16/2007), ESOPHAGITIS (03/24/2007), GERD (03/24/2007), GLUCOSE INTOLERANCE (09/16/2007), HEMORRHOIDS, INTERNAL (03/24/2007), HYPERCHOLESTEROLEMIA (03/24/2007), HYPERLIPIDEMIA (09/16/2007), HYPERTENSION (03/28/2007), Hypogonadism male (10/09/2011), Impaired glucose tolerance (10/04/2010), LOW BACK PAIN (03/28/2007), MAGNETIC RESONANCE IMAGING, BRAIN, ABNORMAL (06/26/2009), NECK PAIN, LEFT (06/20/2010), OBESITY (03/24/2007), OTITIS MEDIA, LEFT (06/10/2009), Palpitations (06/20/2010), PVD (06/25/2009), RASH-NONVESICULAR  (06/17/2009), and URI (08/16/2009).  Assessment Plan  See notes Labs reviewed for each problem: Lab Results  Component Value Date   WBC 8.0 07/05/2020   HGB 14.6 07/05/2020   HCT 43.8 07/05/2020   PLT 182.0 07/05/2020   GLUCOSE 162 (H) 07/05/2020   CHOL 173 07/05/2020   TRIG 58.0 07/05/2020   HDL 69.80 07/05/2020   LDLDIRECT 176.0 04/03/2011   LDLCALC 92 07/05/2020   ALT 72 (H) 07/05/2020   AST 26 07/05/2020   NA 140 07/05/2020   K 4.7 07/05/2020   CL 103 07/05/2020   CREATININE 0.90 07/05/2020   BUN 16 07/05/2020   CO2 28 07/05/2020   TSH 0.65 07/05/2020   PSA 0.13 07/05/2020   HGBA1C 6.3 07/05/2020   MICROALBUR <0.7 09/14/2019    Micro: none  Cardiac tracings I have personally interpreted today:  none  Pertinent Radiological findings (summarize): none      There are no preventive care reminders to display for this patient.  There are no preventive care reminders to display for this patient.  Lab Results  Component Value Date   TSH 0.65 07/05/2020   Lab Results  Component Value Date   WBC 8.0 07/05/2020   HGB 14.6 07/05/2020   HCT 43.8 07/05/2020   MCV 91.3 07/05/2020   PLT 182.0 07/05/2020   Lab Results  Component Value Date   NA 140 07/05/2020   K 4.7  07/05/2020   CO2 28 07/05/2020   GLUCOSE 162 (H) 07/05/2020   BUN 16 07/05/2020   CREATININE 0.90 07/05/2020   BILITOT 0.6 07/05/2020   ALKPHOS 74 07/05/2020   AST 26 07/05/2020   ALT 72 (H) 07/05/2020   PROT 7.1 07/05/2020   ALBUMIN 4.5 07/05/2020   CALCIUM 9.6 07/05/2020   ANIONGAP 11 02/14/2020   GFR 85.57 07/05/2020   Lab Results  Component Value Date   CHOL 173 07/05/2020   Lab Results  Component Value Date   HDL 69.80 07/05/2020   Lab Results  Component Value Date   LDLCALC 92 07/05/2020   Lab Results  Component Value Date   TRIG 58.0 07/05/2020   Lab Results  Component Value Date   CHOLHDL 2 07/05/2020   Lab Results  Component Value Date   HGBA1C 6.3 07/05/2020       Assessment & Plan:   Problem List Items Addressed This Visit      Medium   OAB (overactive bladder)    With nocturia x5, for vesicar 5 mg daily, has f/u urology feb 14      Impaired glucose tolerance    Stable,  Lab Results  Component Value Date   HGBA1C 6.3 07/05/2020  cont diet and wt control      Relevant Orders   Hemoglobin A1c (Completed)   Hyperlipidemia    Lab Results  Component Value Date   LDLCALC 92 07/05/2020  stable. Cont crestor no change      Relevant Orders   Lipid panel (Completed)   Hepatic function panel (Completed)   CBC with Differential/Platelet (Completed)   TSH (Completed)   Urinalysis, Routine w reflex microscopic (Completed)   Basic metabolic panel (Completed)   Essential hypertension    stable overall, states BP well controlled at home, to cont lopressor BP Readings from Last 3 Encounters:  07/05/20 (!) 144/82  04/02/20 (!) 185/87  02/28/20 130/62          Low   Right knee pain    DJD vs gout, for uric acid, cont f/u ortho as planned      Relevant Orders   Uric acid (Completed)   Pain and swelling of right lower leg    Cant ro dvt - for RLE venous dopplers      Relevant Orders   VAS [place] LOWER EXTREMITY VENOUS (DVT) (Completed)   Cervicalgia    Mild, 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  I have personally reviewed and have noted:  1) the patient's medical and social history 2) The pt's use of alcohol, tobacco, and illicit drugs 3) The patient's current medications and supplements 4) Functional ability including ADL's, fall risk, home safety risk, hearing and visual impairment 5) Diet and physical activities 6)  Evidence for depression or mood disorder 7) The patient's height, weight, and BMI have been recorded in the chart  I have made referrals, and provided counseling and education based on review of the above       Relevant Orders   PSA (Completed)    Other Visit Diagnoses    Vitamin D deficiency       Relevant Orders   VITAMIN D 25 Hydroxy (Vit-D Deficiency,  Fractures) (Completed)   B12 deficiency       Relevant Orders   Vitamin B12 (Completed)      Meds ordered this encounter  Medications  . solifenacin (VESICARE) 5 MG tablet    Sig: Take 1 tablet (5 mg total) by mouth daily.    Dispense:  90 tablet    Refill:  3    Follow-up: Return in about 3 months (around 10/03/2020).   Cathlean Cower, MD 07/05/2020 10:01 AM [facility]

## 2020-07-06 ENCOUNTER — Encounter: Payer: Self-pay | Admitting: Internal Medicine

## 2020-07-06 NOTE — Assessment & Plan Note (Signed)
DJD vs gout, for uric acid, cont f/u ortho as planned

## 2020-07-06 NOTE — Assessment & Plan Note (Signed)
With nocturia x5, for vesicar 5 mg daily, has f/u urology feb 14

## 2020-07-06 NOTE — Assessment & Plan Note (Signed)
Lab Results  Component Value Date   LDLCALC 92 07/05/2020  stable. Cont crestor no change

## 2020-07-06 NOTE — Assessment & Plan Note (Signed)
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

## 2020-07-06 NOTE — Assessment & Plan Note (Signed)
Cant ro dvt - for RLE venous dopplers

## 2020-07-06 NOTE — Assessment & Plan Note (Addendum)
stable overall, states BP well controlled at home, to cont lopressor BP Readings from Last 3 Encounters:  07/05/20 (!) 144/82  04/02/20 (!) 185/87  02/28/20 130/62

## 2020-07-06 NOTE — Assessment & Plan Note (Signed)
Stable,  Lab Results  Component Value Date   HGBA1C 6.3 07/05/2020  cont diet and wt control

## 2020-07-06 NOTE — Assessment & Plan Note (Signed)

## 2020-07-09 LAB — SYNOVIAL FLUID ANALYSIS, COMPLETE
Basophils, %: 0 %
Eosinophils-Synovial: 0 % (ref 0–2)
Lymphocytes-Synovial Fld: 37 % (ref 0–74)
Monocyte/Macrophage: 17 % (ref 0–69)
Neutrophil, Synovial: 46 % — ABNORMAL HIGH (ref 0–24)
Synoviocytes, %: 0 % (ref 0–15)
WBC, Synovial: 113 cells/uL (ref <150)

## 2020-07-09 LAB — ANAEROBIC AND AEROBIC CULTURE
AER RESULT:: NO GROWTH
MICRO NUMBER:: 11409775
MICRO NUMBER:: 11409776
SPECIMEN QUALITY:: ADEQUATE
SPECIMEN QUALITY:: ADEQUATE

## 2020-07-11 ENCOUNTER — Telehealth: Payer: Self-pay

## 2020-07-11 ENCOUNTER — Telehealth: Payer: Self-pay | Admitting: Internal Medicine

## 2020-07-11 NOTE — Telephone Encounter (Signed)
Patient is requesting a call back in regards to a test that he had done on 07/05/20. He can be reached at [phone].

## 2020-07-11 NOTE — Telephone Encounter (Signed)
Spoke with about lab results.

## 2020-07-11 NOTE — Telephone Encounter (Signed)
Submitted VOB, Monovisc, bilateral knee. 

## 2020-07-23 ENCOUNTER — Telehealth: Payer: Self-pay

## 2020-07-23 ENCOUNTER — Telehealth: Payer: Self-pay | Admitting: Internal Medicine

## 2020-07-23 NOTE — Telephone Encounter (Signed)
LVM for pt to rtn my call to schedule awv with nha.. Please schedule AWV if pt calls the office.  

## 2020-07-23 NOTE — Telephone Encounter (Signed)
PA required for Monovisc, bilateral knee. PA submitted online through [facility] Pending PA# [id number]

## 2020-07-25 ENCOUNTER — Telehealth: Payer: Self-pay

## 2020-07-25 NOTE — Telephone Encounter (Signed)
Approved for Monovisc, bilateral knee. [place] Patient will be responsible for 20% OOP. Co-pay of $40.00 PA required PA Approval# [id number] Valid 07/23/2020-01/20/2021  Appt. 07/31/2020 with Dr. Erlinda Hong

## 2020-07-26 ENCOUNTER — Other Ambulatory Visit: Payer: Self-pay | Admitting: Internal Medicine

## 2020-07-26 DIAGNOSIS — E349 Endocrine disorder, unspecified: Secondary | ICD-10-CM | POA: Diagnosis not present

## 2020-07-27 NOTE — Telephone Encounter (Signed)
Please refill as per office routine med refill policy (all routine meds refilled for 3 mo or monthly per pt preference up to one year from last visit, then month to month grace period for 3 mo, then further med refills will have to be denied)  

## 2020-07-31 ENCOUNTER — Ambulatory Visit: Payer: Medicare PPO | Admitting: Orthopaedic Surgery

## 2020-07-31 ENCOUNTER — Encounter: Payer: Self-pay | Admitting: Orthopaedic Surgery

## 2020-07-31 DIAGNOSIS — M170 Bilateral primary osteoarthritis of knee: Secondary | ICD-10-CM

## 2020-07-31 MED ORDER — LIDOCAINE HCL 1 % IJ SOLN
3.0000 mL | INTRAMUSCULAR | Status: AC | PRN
  Administered 2020-07-31: 3 mL

## 2020-07-31 MED ORDER — HYALURONAN 88 MG/4ML IX SOSY
88.0000 mg | PREFILLED_SYRINGE | INTRA_ARTICULAR | Status: AC | PRN
  Administered 2020-07-31: 88 mg via INTRA_ARTICULAR

## 2020-07-31 MED ORDER — BUPIVACAINE HCL 0.25 % IJ SOLN
0.6600 mL | INTRAMUSCULAR | Status: AC | PRN
  Administered 2020-07-31: .66 mL via INTRA_ARTICULAR

## 2020-07-31 NOTE — Progress Notes (Signed)
   Procedure Note  Patient: Warren Weeks             Date of Birth: April 25, 1946           MRN: [id number]             Visit Date: 07/31/2020  Procedures: Visit Diagnoses:  1. Bilateral primary osteoarthritis of knee     Large Joint Inj: bilateral knee on 07/31/2020 9:19 AM Indications: pain Details: 22 G needle, anterolateral approach Medications (Right): 0.66 mL bupivacaine 0.25 %; 3 mL lidocaine 1 %; 88 mg Hyaluronan 88 MG/4ML Medications (Left): 0.66 mL bupivacaine 0.25 %; 3 mL lidocaine 1 %; 88 mg Hyaluronan 88 MG/4ML    Warren Weeks returns today for Visco injections.  These continue to provide him with relief and has decreased his requirement for medications.

## 2020-08-01 ENCOUNTER — Other Ambulatory Visit: Payer: Self-pay

## 2020-08-01 ENCOUNTER — Ambulatory Visit (INDEPENDENT_AMBULATORY_CARE_PROVIDER_SITE_OTHER): Payer: Medicare PPO

## 2020-08-01 VITALS — BP 128/70 | HR 60 | Temp 98.3°F | Ht 69.0 in | Wt 253.2 lb

## 2020-08-01 DIAGNOSIS — Z0000 Encounter for general adult medical examination without abnormal findings: Secondary | ICD-10-CM | POA: Diagnosis not present

## 2020-08-01 NOTE — Patient Instructions (Signed)
Warren Weeks , Thank you for taking time to come for your Medicare Wellness Visit. I appreciate your ongoing commitment to your health goals. Please review the following plan we discussed and let me know if I can assist you in the future.   Screening recommendations/referrals: Colonoscopy: 03/04/2018; due every 5 years Recommended yearly ophthalmology/optometry visit for glaucoma screening and checkup Recommended yearly dental visit for hygiene and checkup  Vaccinations: Influenza vaccine: 04/22/2020 Pneumococcal vaccine: 12/12/2013, 12/14/2014 Tdap vaccine: 12/07/2018; due every 10 years Shingles vaccine: 09/24/2017, 12/20/2017 Covid-19: 07/24/2019, 08/21/2019, 06/24/2020  Advanced directives: Advance directive discussed with you today. Even though you declined this today please call our office should you change your mind and we can give you the proper paperwork for you to fill out.  Conditions/risks identified: Yes; Reviewed health maintenance screenings with patient today and relevant education, vaccines, and/or referrals were provided. Please continue to do your personal lifestyle choices by: daily care of teeth and gums, regular physical activity (goal should be 5 days a week for 30 minutes), eat a healthy diet, avoid tobacco and drug use, limiting any alcohol intake, taking a low-dose aspirin (if not allergic or have been advised by your provider otherwise) and taking vitamins and minerals as recommended by your provider. Continue doing brain stimulating activities (puzzles, reading, adult coloring books, staying active) to keep memory sharp. Continue to eat heart healthy diet (full of fruits, vegetables, whole grains, lean protein, water--limit salt, fat, and sugar intake) and increase physical activity as tolerated.  Next appointment: Please schedule your next Medicare Wellness Visit with your Nurse Health Advisor in 1 year by calling [phone].  Preventive Care 73 Years and Older, Male Preventive care  refers to lifestyle choices and visits with your health care provider that can promote health and wellness. What does preventive care include?  A yearly physical exam. This is also called an annual well check.  Dental exams once or twice a year.  Routine eye exams. Ask your health care provider how often you should have your eyes checked.  Personal lifestyle choices, including:  Daily care of your teeth and gums.  Regular physical activity.  Eating a healthy diet.  Avoiding tobacco and drug use.  Limiting alcohol use.  Practicing safe sex.  Taking low doses of aspirin every day.  Taking vitamin and mineral supplements as recommended by your health care provider. What happens during an annual well check? The services and screenings done by your health care provider during your annual well check will depend on your age, overall health, lifestyle risk factors, and family history of disease. Counseling  Your health care provider may ask you questions about your:  Alcohol use.  Tobacco use.  Drug use.  Emotional well-being.  Home and relationship well-being.  Sexual activity.  Eating habits.  History of falls.  Memory and ability to understand (cognition).  Work and work Statistician. Screening  You may have the following tests or measurements:  Height, weight, and BMI.  Blood pressure.  Lipid and cholesterol levels. These may be checked every 5 years, or more frequently if you are over 56 years old.  Skin check.  Lung cancer screening. You may have this screening every year starting at age 37 if you have a 30-pack-year history of smoking and currently smoke or have quit within the past 15 years.  Fecal occult blood test (FOBT) of the stool. You may have this test every year starting at age 63.  Flexible sigmoidoscopy or colonoscopy. You may have  a sigmoidoscopy every 5 years or a colonoscopy every 10 years starting at age 71.  Prostate cancer screening.  Recommendations will vary depending on your family history and other risks.  Hepatitis C blood test.  Hepatitis B blood test.  Sexually transmitted disease (STD) testing.  Diabetes screening. This is done by checking your blood sugar (glucose) after you have not eaten for a while (fasting). You may have this done every 1-3 years.  Abdominal aortic aneurysm (AAA) screening. You may need this if you are a current or former smoker.  Osteoporosis. You may be screened starting at age 28 if you are at high risk. Talk with your health care provider about your test results, treatment options, and if necessary, the need for more tests. Vaccines  Your health care provider may recommend certain vaccines, such as:  Influenza vaccine. This is recommended every year.  Tetanus, diphtheria, and acellular pertussis (Tdap, Td) vaccine. You may need a Td booster every 10 years.  Zoster vaccine. You may need this after age 39.  Pneumococcal 13-valent conjugate (PCV13) vaccine. One dose is recommended after age 18.  Pneumococcal polysaccharide (PPSV23) vaccine. One dose is recommended after age 36. Talk to your health care provider about which screenings and vaccines you need and how often you need them. This information is not intended to replace advice given to you by your health care provider. Make sure you discuss any questions you have with your health care provider. Document Released: 07/05/2015 Document Revised: 02/26/2016 Document Reviewed: 04/09/2015 Elsevier Interactive Patient Education  2017 [place] Prevention in the Home Falls can cause injuries. They can happen to people of all ages. There are many things you can do to make your home safe and to help prevent falls. What can I do on the outside of my home?  Regularly fix the edges of walkways and driveways and fix any cracks.  Remove anything that might make you trip as you walk through a door, such as a raised step or  threshold.  Trim any bushes or trees on the path to your home.  Use bright outdoor lighting.  Clear any walking paths of anything that might make someone trip, such as rocks or tools.  Regularly check to see if handrails are loose or broken. Make sure that both sides of any steps have handrails.  Any raised decks and porches should have guardrails on the edges.  Have any leaves, snow, or ice cleared regularly.  Use sand or salt on walking paths during winter.  Clean up any spills in your garage right away. This includes oil or grease spills. What can I do in the bathroom?  Use night lights.  Install grab bars by the toilet and in the tub and shower. Do not use towel bars as grab bars.  Use non-skid mats or decals in the tub or shower.  If you need to sit down in the shower, use a plastic, non-slip stool.  Keep the floor dry. Clean up any water that spills on the floor as soon as it happens.  Remove soap buildup in the tub or shower regularly.  Attach bath mats securely with double-sided non-slip rug tape.  Do not have throw rugs and other things on the floor that can make you trip. What can I do in the bedroom?  Use night lights.  Make sure that you have a light by your bed that is easy to reach.  Do not use any sheets or blankets that  are too big for your bed. They should not hang down onto the floor.  Have a firm chair that has side arms. You can use this for support while you get dressed.  Do not have throw rugs and other things on the floor that can make you trip. What can I do in the kitchen?  Clean up any spills right away.  Avoid walking on wet floors.  Keep items that you use a lot in easy-to-reach places.  If you need to reach something above you, use a strong step stool that has a grab bar.  Keep electrical cords out of the way.  Do not use floor polish or wax that makes floors slippery. If you must use wax, use non-skid floor wax.  Do not have  throw rugs and other things on the floor that can make you trip. What can I do with my stairs?  Do not leave any items on the stairs.  Make sure that there are handrails on both sides of the stairs and use them. Fix handrails that are broken or loose. Make sure that handrails are as long as the stairways.  Check any carpeting to make sure that it is firmly attached to the stairs. Fix any carpet that is loose or worn.  Avoid having throw rugs at the top or bottom of the stairs. If you do have throw rugs, attach them to the floor with carpet tape.  Make sure that you have a light switch at the top of the stairs and the bottom of the stairs. If you do not have them, ask someone to add them for you. What else can I do to help prevent falls?  Wear shoes that:  Do not have high heels.  Have rubber bottoms.  Are comfortable and fit you well.  Are closed at the toe. Do not wear sandals.  If you use a stepladder:  Make sure that it is fully opened. Do not climb a closed stepladder.  Make sure that both sides of the stepladder are locked into place.  Ask someone to hold it for you, if possible.  Clearly mark and make sure that you can see:  Any grab bars or handrails.  First and last steps.  Where the edge of each step is.  Use tools that help you move around (mobility aids) if they are needed. These include:  Canes.  Walkers.  Scooters.  Crutches.  Turn on the lights when you go into a dark area. Replace any light bulbs as soon as they burn out.  Set up your furniture so you have a clear path. Avoid moving your furniture around.  If any of your floors are uneven, fix them.  If there are any pets around you, be aware of where they are.  Review your medicines with your doctor. Some medicines can make you feel dizzy. This can increase your chance of falling. Ask your doctor what other things that you can do to help prevent falls. This information is not intended to  replace advice given to you by your health care provider. Make sure you discuss any questions you have with your health care provider. Document Released: 04/04/2009 Document Revised: 11/14/2015 Document Reviewed: 07/13/2014 Elsevier Interactive Patient Education  2017 [employer].

## 2020-08-01 NOTE — Progress Notes (Signed)
Subjective:   Warren Weeks is a 75 y.o. male who presents for Medicare Annual/Subsequent preventive examination.  Review of Systems    No ROS. Medicare Wellness Visit. Additional risk factors are reflected in social history. Cardiac Risk Factors include: advanced age (>61men, >8 women);dyslipidemia;family history of premature cardiovascular disease;hypertension;male gender;obesity (BMI >30kg/m2)     Objective:    Today's Vitals   08/01/20 0946  BP: 128/70  Pulse: 60  Temp: 98.3 F (36.8 C)  SpO2: 95%  Weight: 253 lb 3.2 oz (114.9 kg)  Height: 5\' 9"  (1.753 m)  PainSc: 5    Body mass index is 37.39 kg/m.  Advanced Directives 08/01/2020 03/24/2019 03/16/2018 12/23/2015 12/22/2015  Does Patient Have a Medical Advance Directive? No No No No No  Would patient like information on creating a medical advance directive? No - Patient declined No - Patient declined Yes (ED - Information included in AVS) No - patient declined information No - patient declined information    Current Medications (verified) Outpatient Encounter Medications as of 08/01/2020  Medication Sig  . rosuvastatin (CRESTOR) 40 MG tablet Take 1 tablet by mouth once daily  . acetaminophen (TYLENOL) 650 MG CR tablet Take 650 mg by mouth every 8 (eight) hours as needed for pain.  Marland Kitchen aspirin 81 MG EC tablet Take 81 mg by mouth daily.  . colchicine 0.6 MG tablet Take two pills on day one, and then one pill bid until symptoms resolve, but do not take longer than 3 days  . Cyanocobalamin (B-12 PO) Take 1 tablet by mouth daily.  . diclofenac (VOLTAREN) 75 MG EC tablet Take 1 tablet (75 mg total) by mouth 2 (two) times daily as needed.  . ezetimibe (ZETIA) 10 MG tablet Take 1 tablet by mouth once daily  . hydrochlorothiazide (HYDRODIURIL) 25 MG tablet Take 1 tablet (25 mg total) by mouth daily.  Marland Kitchen lisinopril (ZESTRIL) 20 MG tablet Take 1 tablet by mouth once daily  . Melatonin 10 MG TABS Take 10 mg by mouth at bedtime.   .  methocarbamol (ROBAXIN) 500 MG tablet Take 1 tablet (500 mg total) by mouth 2 (two) times daily as needed.  . metoprolol tartrate (LOPRESSOR) 50 MG tablet Take 1 tablet (50 mg total) by mouth 2 (two) times daily.  . mometasone (NASONEX) 50 MCG/ACT nasal spray Place 2 sprays into the nose daily.  . Multiple Vitamin (ONE-A-DAY 55 PLUS PO) Take 1 tablet by mouth daily.  Marland Kitchen omeprazole (PRILOSEC) 20 MG capsule 1 tab by mouth in the am and 2 in the PM  . sildenafil (VIAGRA) 100 MG tablet Take 1 tablet (100 mg total) by mouth as needed for erectile dysfunction.  . solifenacin (VESICARE) 5 MG tablet Take 1 tablet (5 mg total) by mouth daily.  Marland Kitchen terazosin (HYTRIN) 5 MG capsule Take 5 mg by mouth at bedtime.   . thiamine 250 MG tablet Take 250 mg by mouth daily.  Marland Kitchen tiZANidine (ZANAFLEX) 2 MG tablet Take 1 tablet (2 mg total) by mouth 2 (two) times daily as needed for muscle spasms.  . traMADol (ULTRAM) 50 MG tablet Take 1 tablet (50 mg total) by mouth 3 (three) times daily as needed.  . triamcinolone cream (KENALOG) 0.5 % Apply 1 application topically 3 (three) times daily.  . Turmeric 500 MG TABS Take by mouth daily.   No facility-administered encounter medications on file as of 08/01/2020.    Allergies (verified) Cefuroxime axetil   History: Past Medical History:  Diagnosis  Date  . ALLERGIC RHINITIS 09/16/2007  . ALLERGY 03/24/2007  . Allergy   . ANXIETY 03/28/2007  . Arthritis    knees, back,shoulder  . CAROTID ARTERY STENOSIS, BILATERAL 07/01/2009   yearly checks - no current problems  . CEPHALGIA 06/08/2009  . CHEST PAIN 09/16/2007   no current problems  . COLONIC POLYPS, HX OF 09/16/2007  . COPD (chronic obstructive pulmonary disease) ([place]) 05/12/2017   mild per patient - no inhaler  . Dizziness and giddiness 06/08/2009  . 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 DYSPNEA/SHORTNESS OF BREATH 09/16/2007  . ERECTILE DYSFUNCTION 03/28/2007  . ESOPHAGEAL STRICTURE 09/16/2007  . ESOPHAGITIS 03/24/2007   . GERD 03/24/2007  . GLUCOSE INTOLERANCE 09/16/2007  . HEMORRHOIDS, INTERNAL 03/24/2007  . HYPERCHOLESTEROLEMIA 03/24/2007  . HYPERLIPIDEMIA 09/16/2007  . HYPERTENSION 03/28/2007  . 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 Impaired glucose tolerance 10/04/2010  . LOW BACK PAIN 03/28/2007  . MAGNETIC RESONANCE IMAGING, BRAIN, ABNORMAL 06/26/2009  . NECK PAIN, LEFT 06/20/2010  . OBESITY 03/24/2007  . OTITIS MEDIA, LEFT 06/10/2009  . Palpitations 06/20/2010   no current problems  . PVD 06/25/2009  . RASH-NONVESICULAR 06/17/2009  . URI 08/16/2009   Past Surgical History:  Procedure Laterality Date  . COLONOSCOPY  03/2013   polyps/[place]  . s/p right knee arthroscopy    . UPPER GASTROINTESTINAL ENDOSCOPY     [facility]   Family History  Problem Relation Age of Onset  . Lymphoma Mother   . Heart disease Father        CHF  . Ovarian cancer Sister   . Lung cancer Sister   . Coronary artery disease Sister        CABG  . Coronary artery disease Brother        stent, and CAD  . Colon cancer Neg Hx   . Esophageal cancer Neg Hx   . Rectal cancer Neg Hx   . Stomach cancer Neg Hx   . Colon polyps Neg Hx    Social History   Socioeconomic History  . Marital status: Married    Spouse name: Charleston Ropes  . Number of children: 2  . Years of education: Not on file  . Highest education level: Not on file  Occupational History  . Occupation: ship and Physiological scientist: [employer]  Tobacco Use  . Smoking status: Former Smoker    Packs/day: 0.50    Years: 15.00    Pack years: 7.50    Types: Cigarettes  . Smokeless tobacco: Never Used  . Tobacco comment: Quit 30 years ago  Vaping Use  . Vaping Use: Never used  Substance and Sexual Activity  . Alcohol use: Yes    Alcohol/week: 7.0 standard drinks    Types: 7 Cans of beer per week    Comment: beer most days  . Drug use: No  . Sexual activity: Yes  Other Topics Concern  . Not on file  Social History Narrative  . Not on file   Social  Determinants of Health   Financial Resource Strain: Low Risk   . Difficulty of Paying Living Expenses: Not hard at all  Food Insecurity: No Food Insecurity  . Worried About Charity fundraiser in the Last Year: Never true  . Ran Out of Food in the Last Year: Never true  Transportation Needs: No Transportation Needs  . Lack of Transportation (Medical): No  . Lack of Transportation (Non-Medical): No  Physical Activity: Not on file  Stress: No Stress Concern  Present  . Feeling of Stress : Not at all  Social Connections: Socially Integrated  . Frequency of Communication with Friends and Family: More than three times a week  . Frequency of Social Gatherings with Friends and Family: More than three times a week  . Attends Religious Services: More than 4 times per year  . Active Member of Clubs or Organizations: No  . Attends Archivist Meetings: More than 4 times per year  . Marital Status: Married    Tobacco Counseling Counseling given: Not Answered Comment: Quit 30 years ago   Clinical Intake:  Pre-visit preparation completed: Yes  Pain : 0-10 Pain Score: 5  Pain Type: Chronic pain Pain Location: Back Pain Orientation: Lower Pain Radiating Towards: bilateral knees Pain Descriptors / Indicators: Throbbing,Discomfort Pain Onset: More than a month ago Pain Frequency: Constant Pain Relieving Factors: Diclofenac, Tramadol, Gel Injections Effect of Pain on Daily Activities: Pain can diminish job performance, lower motivation to exercise, and prevent you from completing daily tasks. Pain produces disability and affects the quality of life.  Pain Relieving Factors: Diclofenac, Tramadol, Gel Injections  BMI - recorded: 37.39 Nutritional Status: BMI > 30  Obese Nutritional Risks: None Diabetes: No  How often do you need to have someone help you when you read instructions, pamphlets, or other written materials from your doctor or pharmacy?: 1 - Never What is the last  grade level you completed in school?: HSG; [employer] ([place])  Diabetic? no  Interpreter Needed?: No  Information entered by :: Lisette Abu, LPN.   Activities of Daily Living In your present state of health, do you have any difficulty performing the following activities: 08/01/2020  Hearing? N  Vision? N  Difficulty concentrating or making decisions? N  Walking or climbing stairs? N  Dressing or bathing? N  Doing errands, shopping? N  Preparing Food and eating ? N  Using the Toilet? N  In the past six months, have you accidently leaked urine? N  Do you have problems with loss of bowel control? N  Managing your Medications? N  Managing your Finances? N  Housekeeping or managing your Housekeeping? N  Some recent data might be hidden    Patient Care Team: Biagio Borg, MD as PCP - General  Indicate any recent Medical Services you may have received from other than [facility] providers in the past year (date may be approximate).     Assessment:   This is a routine wellness examination for Tyrek.  Hearing/Vision screen No exam data present  Dietary issues and exercise activities discussed: Current Exercise Habits: The patient does not participate in regular exercise at present, Exercise limited by: orthopedic condition(s)  Goals    . Patient Stated     Increase my physical activity by starting to go back to the [facility] and start to walk around my neighborhood.    . Patient Stated     I want to lose weight by decreasing the amount of food I eat and increasing the amount of physical activity that I do.       Depression Screen PHQ 2/9 Scores 08/01/2020 07/05/2020 09/14/2019 03/24/2019 12/07/2018 09/14/2018 03/16/2018  PHQ - 2 Score 0 0 0 0 0 0 0  PHQ- 9 Score - - - - - - -    Fall Risk Fall Risk  08/01/2020 07/05/2020 09/14/2019 03/24/2019 12/07/2018  Falls in the past year? 0 0 0 0 0  Number falls in past yr: 0 - - 0 -  Injury with Fall? 0 - - 0 -  Risk for fall due to : No Fall  Risks - - - -  Follow up - - - - -    [place]:  Any stairs in or around the home? Yes  If so, are there any without handrails? No  Home free of loose throw rugs in walkways, pet beds, electrical cords, etc? Yes  Adequate lighting in your home to reduce risk of falls? Yes   ASSISTIVE DEVICES UTILIZED TO PREVENT FALLS:  Life alert? No  Use of a cane, walker or w/c? Yes  Grab bars in the bathroom? No  Shower chair or bench in shower? No  Elevated toilet seat or a handicapped toilet? Yes   TIMED UP AND GO:  Was the test performed? No .  Length of time to ambulate 10 feet: 0 sec.   Gait steady and fast with assistive device  Cognitive Function: Normal cognitive status assessed by direct observation by this Nurse Health Advisor. No abnormalities found.          Immunizations Immunization History  Administered Date(s) Administered  . Fluad Quad(high Dose 65+) 02/21/2019  . H1N1 04/22/2008  . Influenza Whole 06/10/2009  . Influenza, High Dose Seasonal PF 03/20/2016, 03/16/2017, 03/16/2018  . Influenza,inj,Quad PF,6+ Mos 04/10/2015  . Influenza-Unspecified 03/22/2012, 09/03/2014, 04/22/2020  . PFIZER(Purple Top)SARS-COV-2 Vaccination 07/24/2019, 08/21/2019, 06/24/2020  . Pneumococcal Conjugate-13 12/12/2013  . Pneumococcal Polysaccharide-23 12/14/2014  . Td 10/03/2008  . Tdap 12/07/2018  . Zoster 10/12/2012  . Zoster Recombinat (Shingrix) 09/24/2017, 12/20/2017    TDAP status: Up to date  Flu Vaccine status: Up to date  Pneumococcal vaccine status: Up to date  Covid-19 vaccine status: Completed vaccines  Qualifies for Shingles Vaccine? Yes   Zostavax completed Yes   Shingrix Completed?: Yes  Screening Tests Health Maintenance  Topic Date Due  . COLONOSCOPY (Pts 45-37yrs Insurance coverage will need to be confirmed)  03/05/2023  . TETANUS/TDAP  12/06/2028  . INFLUENZA VACCINE  Completed  . COVID-19 Vaccine  Completed  .  Hepatitis C Screening  Completed  . PNA vac Low Risk Adult  Completed    Health Maintenance  There are no preventive care reminders to display for this patient.  Colorectal cancer screening: Type of screening: Colonoscopy. Completed 03/04/2018. Repeat every 5 years  Lung Cancer Screening: (Low Dose CT Chest recommended if Age 49-80 years, 30 pack-year currently smoking OR have quit w/in 15years.) does qualify.   Lung Cancer Screening Referral: no  Additional Screening:  Hepatitis C Screening: does qualify; Completed: yes  Vision Screening: Recommended annual ophthalmology exams for early detection of glaucoma and other disorders of the eye. Is the patient up to date with their annual eye exam?  Yes  Who is the provider or what is the name of the office in which the patient attends annual eye exams? Rutherford Guys, MD. If pt is not established with a provider, would they like to be referred to a provider to establish care? No .   Dental Screening: Recommended annual dental exams for proper oral hygiene  Community Resource Referral / Chronic Care Management: CRR required this visit?  No   CCM required this visit?  No      Plan:     I have personally reviewed and noted the following in the patient's chart:   . Medical and social history . Use of alcohol, tobacco or illicit drugs  . Current medications and supplements .  Functional ability and status . Nutritional status . Physical activity . Advanced directives . List of other physicians . Hospitalizations, surgeries, and ER visits in previous 12 months . Vitals . Screenings to include cognitive, depression, and falls . Referrals and appointments  In addition, I have reviewed and discussed with patient certain preventive protocols, quality metrics, and best practice recommendations. A written personalized care plan for preventive services as well as general preventive health recommendations were provided to patient.      Sheral Flow, LPN   [id number]   Nurse Notes: n/a

## 2020-08-05 DIAGNOSIS — R351 Nocturia: Secondary | ICD-10-CM | POA: Diagnosis not present

## 2020-08-05 DIAGNOSIS — E291 Testicular hypofunction: Secondary | ICD-10-CM | POA: Diagnosis not present

## 2020-08-05 DIAGNOSIS — N5201 Erectile dysfunction due to arterial insufficiency: Secondary | ICD-10-CM | POA: Diagnosis not present

## 2020-08-05 DIAGNOSIS — N401 Enlarged prostate with lower urinary tract symptoms: Secondary | ICD-10-CM | POA: Diagnosis not present

## 2020-08-06 ENCOUNTER — Other Ambulatory Visit: Payer: Self-pay | Admitting: Internal Medicine

## 2020-08-06 NOTE — Telephone Encounter (Signed)
Please refill as per office routine med refill policy (all routine meds refilled for 3 mo or monthly per pt preference up to one year from last visit, then month to month grace period for 3 mo, then further med refills will have to be denied)  

## 2020-08-28 ENCOUNTER — Ambulatory Visit: Payer: Medicare PPO | Admitting: Internal Medicine

## 2020-09-03 ENCOUNTER — Ambulatory Visit: Payer: Medicare PPO | Admitting: Internal Medicine

## 2020-09-05 ENCOUNTER — Ambulatory Visit: Payer: Medicare PPO | Admitting: Internal Medicine

## 2020-09-05 ENCOUNTER — Ambulatory Visit (INDEPENDENT_AMBULATORY_CARE_PROVIDER_SITE_OTHER): Payer: Medicare PPO

## 2020-09-05 ENCOUNTER — Other Ambulatory Visit: Payer: Self-pay

## 2020-09-05 VITALS — BP 124/64 | HR 81 | Temp 97.8°F | Ht 69.0 in | Wt 252.0 lb

## 2020-09-05 DIAGNOSIS — M542 Cervicalgia: Secondary | ICD-10-CM | POA: Diagnosis not present

## 2020-09-05 DIAGNOSIS — E7800 Pure hypercholesterolemia, unspecified: Secondary | ICD-10-CM | POA: Diagnosis not present

## 2020-09-05 DIAGNOSIS — M7632 Iliotibial band syndrome, left leg: Secondary | ICD-10-CM

## 2020-09-05 DIAGNOSIS — I10 Essential (primary) hypertension: Secondary | ICD-10-CM

## 2020-09-05 DIAGNOSIS — I872 Venous insufficiency (chronic) (peripheral): Secondary | ICD-10-CM | POA: Diagnosis not present

## 2020-09-05 DIAGNOSIS — R7302 Impaired glucose tolerance (oral): Secondary | ICD-10-CM

## 2020-09-05 NOTE — Progress Notes (Signed)
Patient ID: Warren Weeks, male   DOB: [birth year], 75 y.o.   MRN: [id number]        Chief Complaint: follow up HTN, HLD and hyperglycemia, right neck pain. Left IT band pain, and venous insufficiency       HPI:  Warren Weeks is a 75 y.o. male here with 2 mo right post lateral neck pain, sharp and dull at times, without radiation, worse to turn horizontally and hears the grinding sound and feeling, but now constant wax wane in the past week, no RUE radicular symptoms, sleeping ok, volt gel topicaly can help at times, nothing else seems to make better or worse.  Also has intermittent mild pain at the left lateral knee IT band insertion site for the past wk, but no knee swelling, giveaways or falls.  Does also have ongoing bilateral leg swelling, worse in the evening, better in the am, but no worse than usual, and wt no change.  Pt denies chest pain, increased sob or doe, wheezing, orthopnea, PND, increased LE swelling, palpitations, dizziness or syncope.  Denies new focal neuro s/s.   Pt denies polydipsia, polyuria.  No other new complaints       Wt Readings from Last 3 Encounters:  09/05/20 252 lb (114.3 kg)  08/01/20 253 lb 3.2 oz (114.9 kg)  07/05/20 252 lb (114.3 kg)   BP Readings from Last 3 Encounters:  09/05/20 124/64  08/01/20 128/70  07/05/20 (!) 144/82         Past Medical History:  Diagnosis Date  . ALLERGIC RHINITIS 09/16/2007  . ALLERGY 03/24/2007  . Allergy   . ANXIETY 03/28/2007  . Arthritis    knees, back,shoulder  . CAROTID ARTERY STENOSIS, BILATERAL 07/01/2009   yearly checks - no current problems  . CEPHALGIA 06/08/2009  . CHEST PAIN 09/16/2007   no current problems  . COLONIC POLYPS, HX OF 09/16/2007  . COPD (chronic obstructive pulmonary disease) ([place]) 05/12/2017   mild per patient - no inhaler  . Dizziness and giddiness 06/08/2009  . 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 DYSPNEA/SHORTNESS OF BREATH 09/16/2007  . ERECTILE DYSFUNCTION 03/28/2007  . ESOPHAGEAL STRICTURE  09/16/2007  . ESOPHAGITIS 03/24/2007  . GERD 03/24/2007  . GLUCOSE INTOLERANCE 09/16/2007  . HEMORRHOIDS, INTERNAL 03/24/2007  . HYPERCHOLESTEROLEMIA 03/24/2007  . HYPERLIPIDEMIA 09/16/2007  . HYPERTENSION 03/28/2007  . 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 Impaired glucose tolerance 10/04/2010  . LOW BACK PAIN 03/28/2007  . MAGNETIC RESONANCE IMAGING, BRAIN, ABNORMAL 06/26/2009  . NECK PAIN, LEFT 06/20/2010  . OBESITY 03/24/2007  . OTITIS MEDIA, LEFT 06/10/2009  . Palpitations 06/20/2010   no current problems  . PVD 06/25/2009  . RASH-NONVESICULAR 06/17/2009  . URI 08/16/2009   Past Surgical History:  Procedure Laterality Date  . COLONOSCOPY  03/2013   polyps/[place]  . s/p right knee arthroscopy    . UPPER GASTROINTESTINAL ENDOSCOPY     gerd    reports that he has quit smoking. His smoking use included cigarettes. He has a 7.50 pack-year smoking history. He has never used smokeless tobacco. He reports current alcohol use of about 7.0 standard drinks of alcohol per week. He reports that he does not use drugs. family history includes Coronary artery disease in his brother and sister; Heart disease in his father; Lung cancer in his sister; Lymphoma in his mother; Ovarian cancer in his sister. Allergies  Allergen Reactions  . Cefuroxime Axetil Rash   Current Outpatient Medications on File Prior to Visit  Medication Sig Dispense  Refill  . rosuvastatin (CRESTOR) 40 MG tablet Take 1 tablet by mouth once daily 90 tablet 0  . acetaminophen (TYLENOL) 650 MG CR tablet Take 650 mg by mouth every 8 (eight) hours as needed for pain.    Marland Kitchen aspirin 81 MG EC tablet Take 81 mg by mouth daily.    . colchicine 0.6 MG tablet Take two pills on day one, and then one pill bid until symptoms resolve, but do not take longer than 3 days 8 tablet 2  . Cyanocobalamin (B-12 PO) Take 1 tablet by mouth daily.    . diclofenac (VOLTAREN) 75 MG EC tablet Take 1 tablet (75 mg total) by mouth 2 (two) times daily as needed. 180 tablet  0  . ezetimibe (ZETIA) 10 MG tablet Take 1 tablet by mouth once daily 90 tablet 3  . hydrochlorothiazide (HYDRODIURIL) 25 MG tablet Take 1 tablet (25 mg total) by mouth daily. 90 tablet 3  . lisinopril (ZESTRIL) 20 MG tablet Take 1 tablet by mouth once daily 90 tablet 2  . Melatonin 10 MG TABS Take 10 mg by mouth at bedtime.     . methocarbamol (ROBAXIN) 500 MG tablet Take 1 tablet (500 mg total) by mouth 2 (two) times daily as needed. 30 tablet 1  . metoprolol tartrate (LOPRESSOR) 50 MG tablet Take 1 tablet by mouth twice daily 180 tablet 0  . mometasone (NASONEX) 50 MCG/ACT nasal spray Place 2 sprays into the nose daily. 1 each 12  . Multiple Vitamin (ONE-A-DAY 55 PLUS PO) Take 1 tablet by mouth daily.    Marland Kitchen omeprazole (PRILOSEC) 20 MG capsule 1 tab by mouth in the am and 2 in the PM 270 capsule 3  . sildenafil (VIAGRA) 100 MG tablet Take 1 tablet (100 mg total) by mouth as needed for erectile dysfunction. 10 tablet 11  . solifenacin (VESICARE) 5 MG tablet Take 1 tablet (5 mg total) by mouth daily. 90 tablet 3  . terazosin (HYTRIN) 5 MG capsule Take 5 mg by mouth at bedtime.     . Testosterone 20.25 MG/1.25GM (1.62%) GEL SMARTSIG:1 Packet(s) T-DERMAL Daily    . thiamine 250 MG tablet Take 250 mg by mouth daily.    Marland Kitchen tiZANidine (ZANAFLEX) 2 MG tablet Take 1 tablet (2 mg total) by mouth 2 (two) times daily as needed for muscle spasms. 30 tablet 0  . traMADol (ULTRAM) 50 MG tablet Take 1 tablet (50 mg total) by mouth 3 (three) times daily as needed. 30 tablet 0  . triamcinolone cream (KENALOG) 0.5 % Apply 1 application topically 3 (three) times daily. 30 g 1  . Turmeric 500 MG TABS Take by mouth daily.     No current facility-administered medications on file prior to visit.        ROS:  All others reviewed and negative.  Objective        PE:  BP 124/64   Pulse 81   Temp 97.8 F (36.6 C) (Oral)   Ht 5\' 9"  (1.753 m)   Wt 252 lb (114.3 kg)   SpO2 95%   BMI 37.21 kg/m                  Constitutional: Pt appears in NAD               HENT: Head: NCAT.                Right Ear: External ear normal.  Left Ear: External ear normal.                Eyes: . Pupils are equal, round, and reactive to light. Conjunctivae and EOM are normal               Nose: without d/c or deformity               Neck: Neck supple. Gross normal ROM               Cardiovascular: Normal rate and regular rhythm.                 Pulmonary/Chest: Effort normal and breath sounds without rales or wheezing.                Abd:  Soft, NT, ND, + BS, no organomegaly               Right post lat neck nontender non swollen, no rash               Left lateral knee with mild tender, no swelling and knee with FROM, no effusion               Neurological: Pt is alert. At baseline orientation, motor grossly intact               Skin: Skin is warm. No rashes, no other new lesions, LE edema - trace to 1+ bilat               Psychiatric: Pt behavior is normal without agitation   Micro: none  Cardiac tracings I have personally interpreted today:  none  Pertinent Radiological findings (summarize): MRI LS spine December 06, 2016 IMPRESSION: 1. 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 2. Mild L4-5 canal stenosis.  No high-grade canal stenosis. 3. Foraminal narrowing is mild bilaterally at L3-4, moderate bilaterally at L4-5, and moderate bilaterally at L5-S1. 4. Right L3-4 facet effusion is likely degenerative   Lab Results  Component Value Date   WBC 8.0 07/05/2020   HGB 14.6 07/05/2020   HCT 43.8 07/05/2020   PLT 182.0 07/05/2020   GLUCOSE 162 (H) 07/05/2020   CHOL 173 07/05/2020   TRIG 58.0 07/05/2020   HDL 69.80 07/05/2020   LDLDIRECT 176.0 04/03/2011   LDLCALC 92 07/05/2020   ALT 72 (H) 07/05/2020   AST 26 07/05/2020   NA 140 07/05/2020   K 4.7 07/05/2020   CL 103 07/05/2020   CREATININE 0.90 07/05/2020   BUN 16 07/05/2020   CO2 28  07/05/2020   TSH 0.65 07/05/2020   PSA 0.13 07/05/2020   HGBA1C 6.3 07/05/2020   MICROALBUR <0.7 09/14/2019   Assessment/Plan:  Warren Weeks is a 75 y.o. Black or African American [2] male with  has a past medical history of ALLERGIC RHINITIS (09/16/2007), ALLERGY (03/24/2007), Allergy, ANXIETY (03/28/2007), Arthritis, CAROTID ARTERY STENOSIS, BILATERAL (07/01/2009), CEPHALGIA (06/08/2009), CHEST PAIN (09/16/2007), COLONIC POLYPS, HX OF (09/16/2007), COPD (chronic obstructive pulmonary disease) ([place]) (05/12/2017), Dizziness and giddiness (06/08/2009), Dyshidrotic eczema (06/03/2018), DYSPNEA/SHORTNESS OF BREATH (09/16/2007), ERECTILE DYSFUNCTION (03/28/2007), ESOPHAGEAL STRICTURE (09/16/2007), ESOPHAGITIS (03/24/2007), GERD (03/24/2007), GLUCOSE INTOLERANCE (09/16/2007), HEMORRHOIDS, INTERNAL (03/24/2007), HYPERCHOLESTEROLEMIA (03/24/2007), HYPERLIPIDEMIA (09/16/2007), HYPERTENSION (03/28/2007), Hypogonadism male (10/09/2011), Impaired glucose tolerance (10/04/2010), LOW BACK PAIN (03/28/2007), MAGNETIC RESONANCE IMAGING, BRAIN, ABNORMAL (06/26/2009), NECK PAIN, LEFT (06/20/2010), OBESITY (03/24/2007), OTITIS MEDIA, LEFT (06/10/2009), Palpitations (06/20/2010), PVD (06/25/2009), RASH-NONVESICULAR (06/17/2009), and URI (08/16/2009).  Essential hypertension BP Readings from Last 3  Encounters:  09/05/20 124/64  08/01/20 128/70  07/05/20 (!) 144/82   Stable, pt to continue medical treatment lisinopril, hct, lopressor, hytrin   Hyperlipidemia Lab Results  Component Value Date   LDLCALC 92 07/05/2020   Stable, pt to continue current statin crestor 40  Current Outpatient Medications (Endocrine & Metabolic):  Marland Kitchen  Testosterone 20.25 MG/1.25GM (1.62%) GEL, SMARTSIG:1 Packet(s) T-DERMAL Daily  Current Outpatient Medications (Cardiovascular):  .  rosuvastatin (CRESTOR) 40 MG tablet, Take 1 tablet by mouth once daily .  ezetimibe (ZETIA) 10 MG tablet, Take 1 tablet by mouth once daily .  hydrochlorothiazide (HYDRODIURIL)  25 MG tablet, Take 1 tablet (25 mg total) by mouth daily. Marland Kitchen  lisinopril (ZESTRIL) 20 MG tablet, Take 1 tablet by mouth once daily .  metoprolol tartrate (LOPRESSOR) 50 MG tablet, Take 1 tablet by mouth twice daily .  sildenafil (VIAGRA) 100 MG tablet, Take 1 tablet (100 mg total) by mouth as needed for erectile dysfunction. Marland Kitchen  terazosin (HYTRIN) 5 MG capsule, Take 5 mg by mouth at bedtime.   Current Outpatient Medications (Respiratory):  .  mometasone (NASONEX) 50 MCG/ACT nasal spray, Place 2 sprays into the nose daily.  Current Outpatient Medications (Analgesics):  .  acetaminophen (TYLENOL) 650 MG CR tablet, Take 650 mg by mouth every 8 (eight) hours as needed for pain. Marland Kitchen  aspirin 81 MG EC tablet, Take 81 mg by mouth daily. .  colchicine 0.6 MG tablet, Take two pills on day one, and then one pill bid until symptoms resolve, but do not take longer than 3 days .  diclofenac (VOLTAREN) 75 MG EC tablet, Take 1 tablet (75 mg total) by mouth 2 (two) times daily as needed. .  traMADol (ULTRAM) 50 MG tablet, Take 1 tablet (50 mg total) by mouth 3 (three) times daily as needed.  Current Outpatient Medications (Hematological):  Marland Kitchen  Cyanocobalamin (B-12 PO), Take 1 tablet by mouth daily.  Current Outpatient Medications (Other):  Marland Kitchen  Melatonin 10 MG TABS, Take 10 mg by mouth at bedtime.  .  methocarbamol (ROBAXIN) 500 MG tablet, Take 1 tablet (500 mg total) by mouth 2 (two) times daily as needed. .  Multiple Vitamin (ONE-A-DAY 55 PLUS PO), Take 1 tablet by mouth daily. Marland Kitchen  omeprazole (PRILOSEC) 20 MG capsule, 1 tab by mouth in the am and 2 in the PM .  solifenacin (VESICARE) 5 MG tablet, Take 1 tablet (5 mg total) by mouth daily. Marland Kitchen  thiamine 250 MG tablet, Take 250 mg by mouth daily. Marland Kitchen  tiZANidine (ZANAFLEX) 2 MG tablet, Take 1 tablet (2 mg total) by mouth 2 (two) times daily as needed for muscle spasms. Marland Kitchen  triamcinolone cream (KENALOG) 0.5 %, Apply 1 application topically 3 (three) times daily. .   Turmeric 500 MG TABS, Take by mouth daily.   Iliotibial band syndrome affecting left lower leg Mild, for topical volt gel prn, consider f/u with sport medicine vs orthopedic, consider PT referral  Impaired glucose tolerance Lab Results  Component Value Date   HGBA1C 6.3 07/05/2020   Stable, pt to continue current medical treatment  - diet, wt control   Neck pain on right side C/w likely underlying djd, for plain films, consider MRI, declines referral to sport med for now  Venous insufficiency Chronic stable persistent, d/w pt - for leg elevation, low salt, compression stockings during daytime  Followup: Return in about 6 months (around 03/08/2021).  Cathlean Cower, MD 09/08/2020 9:51 PM [facility] -  [facility]

## 2020-09-05 NOTE — Patient Instructions (Addendum)
Ok to try the voltaren gel for the right neck  OK to try the OTC compressoin stockings such as at [facility] on [address] for the leg swelling  Please call if you change your mind about the MRI for the neck  You can also see Sports Medicine if you like on the first floor, or see Dr Erlinda Hong as well  Please continue all other medications as before, and refills have been done if requested.  Please have the pharmacy call with any other refills you may need.  Please continue your efforts at being more active, low cholesterol diet, and weight control..  Please keep your appointments with your specialists as you may have planned  Please go to the XRAY Department in the first floor for the x-ray testing  You will be contacted by phone if any changes need to be made immediately.  Otherwise, you will receive a letter about your results with an explanation, but please check with MyChart first.  Please remember to sign up for MyChart if you have not done so, as this will be important to you in the future with finding out test results, communicating by private email, and scheduling acute appointments online when needed.  Please make an Appointment to return in 6 months, or sooner if needed

## 2020-09-06 ENCOUNTER — Encounter: Payer: Self-pay | Admitting: Internal Medicine

## 2020-09-08 ENCOUNTER — Encounter: Payer: Self-pay | Admitting: Internal Medicine

## 2020-09-08 DIAGNOSIS — M7632 Iliotibial band syndrome, left leg: Secondary | ICD-10-CM | POA: Insufficient documentation

## 2020-09-08 NOTE — Assessment & Plan Note (Signed)
Lab Results  Component Value Date   HGBA1C 6.3 07/05/2020   Stable, pt to continue current medical treatment  - diet, wt control

## 2020-09-08 NOTE — Assessment & Plan Note (Signed)
BP Readings from Last 3 Encounters:  09/05/20 124/64  08/01/20 128/70  07/05/20 (!) 144/82   Stable, pt to continue medical treatment lisinopril, hct, lopressor, hytrin

## 2020-09-08 NOTE — Assessment & Plan Note (Signed)
C/w likely underlying djd, for plain films, consider MRI, declines referral to sport med for now

## 2020-09-08 NOTE — Assessment & Plan Note (Signed)
Chronic stable persistent, d/w pt - for leg elevation, low salt, compression stockings during daytime

## 2020-09-08 NOTE — Assessment & Plan Note (Signed)
Mild, for topical volt gel prn, consider f/u with sport medicine vs orthopedic, consider PT referral

## 2020-09-08 NOTE — Assessment & Plan Note (Signed)
Lab Results  Component Value Date   LDLCALC 92 07/05/2020   Stable, pt to continue current statin crestor 40  Current Outpatient Medications (Endocrine & Metabolic):  Marland Kitchen  Testosterone 20.25 MG/1.25GM (1.62%) GEL, SMARTSIG:1 Packet(s) T-DERMAL Daily  Current Outpatient Medications (Cardiovascular):  .  rosuvastatin (CRESTOR) 40 MG tablet, Take 1 tablet by mouth once daily .  ezetimibe (ZETIA) 10 MG tablet, Take 1 tablet by mouth once daily .  hydrochlorothiazide (HYDRODIURIL) 25 MG tablet, Take 1 tablet (25 mg total) by mouth daily. Marland Kitchen  lisinopril (ZESTRIL) 20 MG tablet, Take 1 tablet by mouth once daily .  metoprolol tartrate (LOPRESSOR) 50 MG tablet, Take 1 tablet by mouth twice daily .  sildenafil (VIAGRA) 100 MG tablet, Take 1 tablet (100 mg total) by mouth as needed for erectile dysfunction. Marland Kitchen  terazosin (HYTRIN) 5 MG capsule, Take 5 mg by mouth at bedtime.   Current Outpatient Medications (Respiratory):  .  mometasone (NASONEX) 50 MCG/ACT nasal spray, Place 2 sprays into the nose daily.  Current Outpatient Medications (Analgesics):  .  acetaminophen (TYLENOL) 650 MG CR tablet, Take 650 mg by mouth every 8 (eight) hours as needed for pain. Marland Kitchen  aspirin 81 MG EC tablet, Take 81 mg by mouth daily. .  colchicine 0.6 MG tablet, Take two pills on day one, and then one pill bid until symptoms resolve, but do not take longer than 3 days .  diclofenac (VOLTAREN) 75 MG EC tablet, Take 1 tablet (75 mg total) by mouth 2 (two) times daily as needed. .  traMADol (ULTRAM) 50 MG tablet, Take 1 tablet (50 mg total) by mouth 3 (three) times daily as needed.  Current Outpatient Medications (Hematological):  Marland Kitchen  Cyanocobalamin (B-12 PO), Take 1 tablet by mouth daily.  Current Outpatient Medications (Other):  Marland Kitchen  Melatonin 10 MG TABS, Take 10 mg by mouth at bedtime.  .  methocarbamol (ROBAXIN) 500 MG tablet, Take 1 tablet (500 mg total) by mouth 2 (two) times daily as needed. .  Multiple Vitamin  (ONE-A-DAY 55 PLUS PO), Take 1 tablet by mouth daily. Marland Kitchen  omeprazole (PRILOSEC) 20 MG capsule, 1 tab by mouth in the am and 2 in the PM .  solifenacin (VESICARE) 5 MG tablet, Take 1 tablet (5 mg total) by mouth daily. Marland Kitchen  thiamine 250 MG tablet, Take 250 mg by mouth daily. Marland Kitchen  tiZANidine (ZANAFLEX) 2 MG tablet, Take 1 tablet (2 mg total) by mouth 2 (two) times daily as needed for muscle spasms. Marland Kitchen  triamcinolone cream (KENALOG) 0.5 %, Apply 1 application topically 3 (three) times daily. .  Turmeric 500 MG TABS, Take by mouth daily.

## 2020-09-25 ENCOUNTER — Telehealth: Payer: Self-pay | Admitting: Internal Medicine

## 2020-09-25 NOTE — Telephone Encounter (Signed)
I would not for that type of pain he mentioned, as the MRI is really to determine if surgury is needed, and this would seem very unlikely based on that exam last visit  I can refer urgently to Sports Medicine if he likes for further consideration, or orthopedic

## 2020-09-25 NOTE — Telephone Encounter (Signed)
Patient called and said that he is still having neck pain. He was wondering if he could get a referral for an MRI. He can be reached at [phone]. Please advise

## 2020-09-26 NOTE — Telephone Encounter (Signed)
Patient advised of message as written below by provider. Per patient, will contact his Orthopedic doctor regarding this. No referral needed. Routed to provider for final review and close encounter

## 2020-09-30 ENCOUNTER — Encounter: Payer: Self-pay | Admitting: Orthopaedic Surgery

## 2020-09-30 ENCOUNTER — Other Ambulatory Visit: Payer: Self-pay

## 2020-09-30 ENCOUNTER — Ambulatory Visit: Payer: Medicare PPO | Admitting: Orthopaedic Surgery

## 2020-09-30 DIAGNOSIS — G8929 Other chronic pain: Secondary | ICD-10-CM | POA: Diagnosis not present

## 2020-09-30 DIAGNOSIS — M542 Cervicalgia: Secondary | ICD-10-CM

## 2020-09-30 DIAGNOSIS — M545 Low back pain, unspecified: Secondary | ICD-10-CM | POA: Diagnosis not present

## 2020-09-30 MED ORDER — DICLOFENAC SODIUM 75 MG PO TBEC: 75.0000 mg | DELAYED_RELEASE_TABLET | Freq: Two times a day (BID) | ORAL | 2 refills | Status: DC | PRN

## 2020-09-30 MED ORDER — PREDNISONE 10 MG (21) PO TBPK: ORAL_TABLET | ORAL | 0 refills | Status: DC

## 2020-09-30 MED ORDER — METHOCARBAMOL 500 MG PO TABS: 500.0000 mg | ORAL_TABLET | Freq: Two times a day (BID) | ORAL | 0 refills | Status: DC | PRN

## 2020-09-30 NOTE — Progress Notes (Signed)
Office Visit Note   Patient: Warren Weeks           Date of Birth: 1945/07/24           MRN: [id number] Visit Date: 09/30/2020              Requested by: Biagio Borg, MD [place],  [place] [address] PCP: Biagio Borg, MD   Assessment & Plan: Visit Diagnoses:  1. Neck pain   2. Chronic right-sided low back pain, unspecified whether sciatica present     Plan: Impression is chronic neck and back pain with evidence of arthropathy on x-rays.  At this point, I will start the patient on a steroid taper and muscle relaxers as well as refer him to physical therapy.  He will follow up with [place] as needed.  Follow-Up Instructions: Return if symptoms worsen or fail to improve.   Orders:  Orders Placed This Encounter  Procedures  . Ambulatory referral to Physical Therapy   Meds ordered this encounter  Medications  . predniSONE (STERAPRED UNI-PAK 21 TAB) 10 MG (21) TBPK tablet    Sig: Take as directed.  Do not take any other nsaids while taking this steroid    Dispense:  21 tablet    Refill:  0  . diclofenac (VOLTAREN) 75 MG EC tablet    Sig: Take 1 tablet (75 mg total) by mouth 2 (two) times daily as needed. Do not take while on steroid taper    Dispense:  60 tablet    Refill:  2  . methocarbamol (ROBAXIN) 500 MG tablet    Sig: Take 1 tablet (500 mg total) by mouth 2 (two) times daily as needed.    Dispense:  20 tablet    Refill:  0      Procedures: No procedures performed   Clinical Data: No additional findings.   Subjective: Chief Complaint  Patient presents with  . Neck - Pain    HPI pleasant 75 year old gentleman who comes in today with chronic neck and back pain.  His neck has been bothering him primarily for the past 2 months.  No new injury or change in activity.  The pain is to the base of the neck and occasionally radiates to the top of the right shoulder.  This is intermittent in nature but worse when turning his neck to the left or right.  He  denies any weakness or paresthesias to either upper extremity.  He tried using Voltaren gel on his neck but developed a rash.  He notes an occasional headache as well.  In regards to his back, the pain is primarily to the right lower aspect.  He occasionally notes radiation to the left lower extremity.  No paresthesias or weakness to either lower extremity.  No previous ESI or physical therapy to either his back or neck.  No bowel or bladder change and no saddle paresthesias.  Review of Systems as detailed in HPI.  All others reviewed and are negative.   Objective: Vital Signs: There were no vitals taken for this visit.  Physical Exam well-developed well-nourished gentleman in no acute distress.  Alert and oriented x3.  Ortho Exam cervical spine exam reveals no spinous or paraspinous tenderness.  He does have slight tenderness to the right lateral neck and top of the right shoulder.  No focal weakness.  Lumbar spine shows no spinous or paraspinous tenderness.  He has a positive straight leg raise on the left and  negative on the right.  He is neurovascularly intact distally.  Specialty Comments:  No specialty comments available.  Imaging: Imaging of the neck and back reviewed by me in canopy show diffuse degenerative changes throughout.  He does have moderate degenerative changes L5-S1.   PMFS History: Patient Active Problem List   Diagnosis Date Noted  . Iliotibial band syndrome affecting left lower leg 09/08/2020  . Neck pain on right side 09/05/2020  . OAB (overactive bladder) 07/05/2020  . Pain and swelling of right lower leg 07/05/2020  . Cervicalgia 07/05/2020  . Right knee pain 07/05/2020  . PVC's (premature ventricular contractions) 03/02/2020  . BPH (benign prostatic hyperplasia) 03/02/2020  . Arthritis 03/02/2020  . Diastolic dysfunction 87/86/7672  . Urinary frequency 09/14/2019  . Hoarseness of voice 12/07/2018  . Urinary leakage 12/07/2018  . Left knee pain 12/07/2018   . Chronic sinusitis 10/05/2018  . 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 COPD (chronic obstructive pulmonary disease) ([place]) 05/12/2017  . Pleurisy 03/16/2017  . Bilateral leg pain 11/22/2016  . Insomnia 10/13/2016  . Cough 07/22/2016  . Wheezing 07/22/2016  . Peripheral edema 02/21/2016  . Venous insufficiency 02/05/2016  . Low back pain radiating to lower extremity 02/05/2016  . Vertigo   . Hypersomnolence 10/24/2015  . 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 Chest pain 01/18/2013  . Hematochezia 10/12/2012  . Viral illness 05/26/2012  . Palpitations 01/05/2012  . 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 Fatigue 04/09/2011  . Rash 12/22/2010  . Encounter for long-term (current) use of high-risk medication 10/08/2010  . Impaired glucose tolerance 10/04/2010  . Encounter for well adult exam with abnormal findings 10/04/2010  . Carotid artery stenosis 07/01/2009  . PVD 06/25/2009  . Allergic rhinitis 09/16/2007  . ESOPHAGEAL STRICTURE 09/16/2007  . COLONIC POLYPS, HX OF 09/16/2007  . Anxiety state 03/28/2007  . ERECTILE DYSFUNCTION 03/28/2007  . Hyperlipidemia 03/28/2007  . Essential hypertension 03/28/2007  . LOW BACK PAIN 03/28/2007  . OBESITY 03/24/2007  . HEMORRHOIDS, INTERNAL 03/24/2007  . ESOPHAGITIS 03/24/2007  . GERD 03/24/2007   Past Medical History:  Diagnosis Date  . ALLERGIC RHINITIS 09/16/2007  . ALLERGY 03/24/2007  . Allergy   . ANXIETY 03/28/2007  . Arthritis    knees, back,shoulder  . CAROTID ARTERY STENOSIS, BILATERAL 07/01/2009   yearly checks - no current problems  . CEPHALGIA 06/08/2009  . CHEST PAIN 09/16/2007   no current problems  . COLONIC POLYPS, HX OF 09/16/2007  . COPD (chronic obstructive pulmonary disease) ([place]) 05/12/2017   mild per patient - no inhaler  . Dizziness and giddiness 06/08/2009  . 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 DYSPNEA/SHORTNESS OF BREATH 09/16/2007  . ERECTILE DYSFUNCTION 03/28/2007  . ESOPHAGEAL STRICTURE 09/16/2007  .  ESOPHAGITIS 03/24/2007  . GERD 03/24/2007  . GLUCOSE INTOLERANCE 09/16/2007  . HEMORRHOIDS, INTERNAL 03/24/2007  . HYPERCHOLESTEROLEMIA 03/24/2007  . HYPERLIPIDEMIA 09/16/2007  . HYPERTENSION 03/28/2007  . 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 Impaired glucose tolerance 10/04/2010  . LOW BACK PAIN 03/28/2007  . MAGNETIC RESONANCE IMAGING, BRAIN, ABNORMAL 06/26/2009  . NECK PAIN, LEFT 06/20/2010  . OBESITY 03/24/2007  . OTITIS MEDIA, LEFT 06/10/2009  . Palpitations 06/20/2010   no current problems  . PVD 06/25/2009  . RASH-NONVESICULAR 06/17/2009  . URI 08/16/2009    Family History  Problem Relation Age of Onset  . Lymphoma Mother   . Heart disease Father        CHF  . Ovarian cancer Sister   . Lung cancer Sister   . Coronary  artery disease Sister        CABG  . Coronary artery disease Brother        stent, and CAD  . Colon cancer Neg Hx   . Esophageal cancer Neg Hx   . Rectal cancer Neg Hx   . Stomach cancer Neg Hx   . Colon polyps Neg Hx     Past Surgical History:  Procedure Laterality Date  . COLONOSCOPY  03/2013   polyps/[place]  . s/p right knee arthroscopy    . UPPER GASTROINTESTINAL ENDOSCOPY     ger[facility]   Social History   Occupational History  . Occupation: ship and Physiological scientist: [employer]  Tobacco Use  . Smoking status: Former Smoker    Packs/day: 0.50    Years: 15.00    Pack years: 7.50    Types: Cigarettes  . Smokeless tobacco: Never Used  . Tobacco comment: Quit 30 years ago  Vaping Use  . Vaping Use: Never used  Substance and Sexual Activity  . Alcohol use: Yes    Alcohol/week: 7.0 standard drinks    Types: 7 Cans of beer per week    Comment: beer most days  . Drug use: No  . Sexual activity: Yes

## 2020-10-08 ENCOUNTER — Other Ambulatory Visit: Payer: Self-pay | Admitting: Internal Medicine

## 2020-10-10 ENCOUNTER — Other Ambulatory Visit: Payer: Self-pay

## 2020-10-10 ENCOUNTER — Encounter: Payer: Self-pay | Admitting: Rehabilitative and Restorative Service Providers"

## 2020-10-10 ENCOUNTER — Ambulatory Visit (INDEPENDENT_AMBULATORY_CARE_PROVIDER_SITE_OTHER): Payer: Medicare PPO | Admitting: Rehabilitative and Restorative Service Providers"

## 2020-10-10 DIAGNOSIS — R293 Abnormal posture: Secondary | ICD-10-CM | POA: Diagnosis not present

## 2020-10-10 DIAGNOSIS — M545 Low back pain, unspecified: Secondary | ICD-10-CM

## 2020-10-10 DIAGNOSIS — M542 Cervicalgia: Secondary | ICD-10-CM | POA: Diagnosis not present

## 2020-10-10 DIAGNOSIS — R262 Difficulty in walking, not elsewhere classified: Secondary | ICD-10-CM

## 2020-10-10 DIAGNOSIS — G8929 Other chronic pain: Secondary | ICD-10-CM | POA: Diagnosis not present

## 2020-10-10 NOTE — Therapy (Addendum)
[facility] [address] [place], [place], [address] Phone: [phone]   Fax:  [phone]  Physical Therapy Evaluation  Patient Details  Name: Warren Weeks MRN: [id number] Date of Birth: Oct 20, 1945 Referring Provider (PT): Dwana Melena PA-C   Encounter Date: 10/10/2020   Referring diagnosis? M54.2 Treatment diagnosis? (if different than referring diagnosis) M54.2, M54.5 What was this (referring dx) caused by? []  Surgery []  Fall [x]  Ongoing issue []  Arthritis []  Other: ____________  Laterality: []  Rt []  Lt [x]  Both  Check all possible CPT codes:      [x]  97110 (Therapeutic Exercise)  []  92507 (SLP Treatment)  [x]  37106 (Neuro Re-ed)   []  92526 (Swallowing Treatment)   [x]  97116 (Gait Training)   []  [id number] (Cognitive Training, 1st 15 minutes) [x]  97140 (Manual Therapy)   []  97130 (Cognitive Training, each add'l 15 minutes)  [x]  97530 (Therapeutic Activities)  []  Other, List CPT Code ____________    []  26948 (Self Care)       []  All codes above (97110 - 97535)  [x]  97012 (Mechanical Traction)  [x]  97014 (E-stim Unattended)  []  97032 (E-stim manual)  []  97033 (Ionto)  [x]  97035 (Ultrasound)  []  97760 (Orthotic Fit) [x]  97750 (Physical Performance Training) []  [id number] (Aquatic Therapy) []  97034 (Contrast Bath) []  [id number] (Paraffin) []  97597 (Wound Care 1st 20 sq cm) []  97598 (Wound Care each add'l 20 sq cm) []  97016 (Vasopneumatic Device) []  [id number] Comptroller) []  [id number] (Prosthetic Training)    PT End of Session - 10/10/20 1440    Visit Number 1    Number of Visits 12    Date for PT Re-Evaluation 12/19/20    Authorization Type [employer] 12 visits until 12/19/2020 - asking    Authorization - Visit Number 1    Authorization - Number of Visits 12    Progress Note Due on Visit 10    PT Start Time 1450    PT Stop Time 1516    PT Time Calculation (min) 26 min    Activity Tolerance Patient tolerated treatment well    Behavior  During Therapy WFL for tasks assessed/performed           Past Medical History:  Diagnosis Date  . ALLERGIC RHINITIS 09/16/2007  . ALLERGY 03/24/2007  . Allergy   . ANXIETY 03/28/2007  . Arthritis    knees, back,shoulder  . CAROTID ARTERY STENOSIS, BILATERAL 07/01/2009   yearly checks - no current problems  . CEPHALGIA 06/08/2009  . CHEST PAIN 09/16/2007   no current problems  . COLONIC POLYPS, HX OF 09/16/2007  . COPD (chronic obstructive pulmonary disease) ([place]) 05/12/2017   mild per patient - no inhaler  . Dizziness and giddiness 06/08/2009  . 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 DYSPNEA/SHORTNESS OF BREATH 09/16/2007  . ERECTILE DYSFUNCTION 03/28/2007  . ESOPHAGEAL STRICTURE 09/16/2007  . ESOPHAGITIS 03/24/2007  . GERD 03/24/2007  . GLUCOSE INTOLERANCE 09/16/2007  . HEMORRHOIDS, INTERNAL 03/24/2007  . HYPERCHOLESTEROLEMIA 03/24/2007  . HYPERLIPIDEMIA 09/16/2007  . HYPERTENSION 03/28/2007  . 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 Impaired glucose tolerance 10/04/2010  . LOW BACK PAIN 03/28/2007  . MAGNETIC RESONANCE IMAGING, BRAIN, ABNORMAL 06/26/2009  . NECK PAIN, LEFT 06/20/2010  . OBESITY 03/24/2007  . OTITIS MEDIA, LEFT 06/10/2009  . Palpitations 06/20/2010   no current problems  . PVD 06/25/2009  . RASH-NONVESICULAR 06/17/2009  . URI 08/16/2009    Past Surgical History:  Procedure Laterality Date  . COLONOSCOPY  03/2013   polyps/[place]  . s/p right  knee arthroscopy    . UPPER GASTROINTESTINAL ENDOSCOPY     gerd    There were no vitals filed for this visit.    Subjective Assessment - 10/10/20 1448    Subjective Complaints of chronic neck and back pain, insidious onset/worsening approx. 2 months ago.  Was given steriod taper and muscle relaxers in course of treatment from referring provider.  Pt. stated help from muscle relaxer.  Pt. stated turning head causes pain on both sides of neck.  Pt. stated complaints in upper part of back and base of neck and burning in shoulder.  Pt. stated  back pain occurs randomly at times, sometimes c standing.  Neck generally worse than back.  Denied numbness/tingling in arms and legs.    Pertinent History anxiety, GERD, hypercholesterolemia, HTN, PVD    Limitations House hold activities;Walking;Standing;Lifting;Sitting    Diagnostic tests xrays showed arthropathy    Patient Stated Goals Reduce pain    Currently in Pain? Yes    Pain Score 8    at worst   Pain Location Neck    Pain Orientation Left;Right;Posterior;Mid;Lower    Pain Descriptors / Indicators Tightness;Aching    Pain Onset More than a month ago    Pain Frequency Intermittent    Aggravating Factors  head turning, insidious symptoms, worse in morning    Pain Relieving Factors avoid movmeents that hurt    Multiple Pain Sites Yes    Pain Score 8   at worst   Pain Location Back    Pain Orientation Lower    Pain Descriptors / Indicators Spasm;Aching    Pain Type Chronic pain    Pain Onset More than a month ago    Pain Frequency Intermittent    Aggravating Factors  insidous spasms, standing ( has knee complaints as well).    Pain Relieving Factors rest              10/10/20 0001  Assessment  Medical Diagnosis Neck/back pain  Referring Provider (PT) Dwana Melena PA-C  Onset Date/Surgical Date 08/06/20 (insidious approx. 2 months ago)  Hand Dominance Right  Prior Therapy none indicated for current condition  Precautions  Precautions None  Restrictions  Weight Bearing Restrictions No  Balance Screen  Has the patient fallen in the past 6 months No  Has the patient had a decrease in activity level because of a fear of falling?  No  Is the patient reluctant to leave their home because of a fear of falling?  No  Home Environment  Living Environment Private residence  Living Arrangements Spouse/significant other  Additional Comments 2 story house with bedroom on 2nd story.  Prior Function  Level of Independence Independent  Leisure Walking (limited by knees), yard  work  Clinical biochemist Status Within Functional Limits for tasks assessed  Observation/Other Assessments  Observations Posture revealed forward head, mild increase in upper thoracic kyphosis, scapular protraction and anterior tilt  Focus on Therapeutic Outcomes (FOTO)  intake 65%, predicted 71%  ROM / Strength  AROM / PROM / Strength Strength;PROM;AROM  AROM  AROM Assessment Site Cervical;Lumbar  Cervical Flexion 45  Cervical Extension 50  Cervical - Right Rotation 62 (Lt and Rt cervical pain)  Cervical - Left Rotation 60 (Lt and Rt cervical pain)  Lumbar Flexion movement to mid shin no complaints  Lumbar Extension 50% WFL with end range lumbar, no radicular symptoms observed  Strength  Strength Assessment Site Shoulder;Hip;Knee;Ankle  Overall Strength Comments Myotomal screening of upper and lower  extremity revealed strong and painless activation  Right/Left Shoulder Left;Right  Right/Left Hip Right;Left  Right/Left Knee Left;Right  Right/Left Ankle Left;Right  Palpation  Spinal mobility Assess on future visits for passive accessory movement  Palpation comment Trigger points  noted in bilateral upper trap, cervical paraspinals.  Transfers  Comments Able to perform sit to stand from 18 inch chair s UE assit  Ambulation/Gait  Gait Comments Straight cane in Rt hand               Objective measurements completed on examination: See above findings.       [place] Adult PT Treatment/Exercise - 10/10/20 0001      Exercises   Exercises Other Exercises    Other Exercises  HEP instruction/performance c cues for techniques, handout provided.  Trial set performed of each for comprehension and symptom assessment.  Consisted of seated scap retraction, upper trap stretch 15 sec, supine lumbar trunk rotation 15 sec, standing lumbar ext, supine/sitting cervical rotation      Manual Therapy   Manual therapy comments compression to Lt and Rt upper trap c shrug movement                   PT Education - 10/10/20 1440    Education Details HEP, POC    Person(s) Educated Patient    Methods Explanation;Demonstration;Verbal cues;Handout    Comprehension Verbalized understanding;Returned demonstration            PT Short Term Goals - 10/10/20 1440      PT SHORT TERM GOAL #1   Title Patient will demonstrate independent use of home exercise program to maintain progress from in clinic treatments.    Time 3    Period Weeks    Status New    Target Date 10/31/20             PT Long Term Goals - 10/10/20 1440      PT LONG TERM GOAL #1   Title Patient will demonstrate/report pain at worst less than or equal to 2/10 to facilitate minimal limitation in daily activity secondary to pain symptoms.    Time 10    Period Weeks    Status New    Target Date 12/19/20      PT LONG TERM GOAL #2   Title Patient will demonstrate independent use of home exercise program to facilitate ability to maintain/progress functional gains from skilled physical therapy services.    Time 10    Period Weeks    Status New    Target Date 12/19/20      PT LONG TERM GOAL #3   Title Pt. will demonstrate cervical AROM WFL s symptoms to facilitate head turning, driving, looking around at PLOF s limitation.    Time 10    Period Weeks    Status New    Target Date 12/19/20      PT LONG TERM GOAL #4   Title Pt. will demonstrate lumbar ext 100 % WFL s symptoms to facilitate upright standing, walking posture at PLOF.    Time 10    Period Weeks    Status New    Target Date 12/19/20      PT LONG TERM GOAL #5   Title Pt. will demonstrate/report ability to perform usual yard work, house activity at [employer] s restriction including sitting/standing.    Time 10    Period Weeks    Status New    Target Date 12/19/20  Additional Long Term Goals   Additional Long Term Goals Yes      PT LONG TERM GOAL #6   Title Pt. will demonstrate FOTO improvement > or = 71 to indicated  reduced disability due to condition.    Time 10    Period Weeks    Status New    Target Date 12/19/20                  Plan - 10/10/20 1441    Clinical Impression Statement Patient is a 75 y.o. who comes to clinic with complaints of cervical and low back pain with mobility deficits primary including myofascial restriction/complaints that impair their ability to perform usual daily and recreational functional activities without increase difficulty/symptoms at this time.  Patient to benefit from skilled PT services to address impairments and limitations to improve to previous level of function without restriction secondary to condition.    Personal Factors and Comorbidities Comorbidity 3+    Comorbidities anxiety, GERD, hypercholesterolemia, HTN, PVD    Examination-Activity Limitations Transfers;Stand;Sleep;Sit;Bend    Examination-Participation Restrictions Community Activity;Yard Work;Cleaning;Laundry;Meal Prep    Stability/Clinical Decision Making Evolving/Moderate complexity    Clinical Decision Making Moderate    Rehab Potential --   fair to good   PT Frequency --   2x/week   PT Duration --   10 wks   PT Treatment/Interventions ADLs/Self Care Home Management;Cryotherapy;Electrical Stimulation;Iontophoresis 4mg /ml Dexamethasone;Moist Heat;Traction;Balance training;Therapeutic exercise;Therapeutic activities;Functional mobility training;Stair training;Gait training;DME Instruction;Ultrasound;Neuromuscular re-education;Patient/family education;Passive range of motion;Spinal Manipulations;Joint Manipulations;Dry needling;Taping;Manual techniques    PT Next Visit Plan Review existing HEP.  Possible passive accessory mobilization for cervical/lumbar, possible dry needling/myofascial release in cervical (haven't discussed dry needling with patient on eval).  Postural mobility    PT Home Exercise Plan [email or web address]    Consulted and Agree with Plan of Care Patient           Patient will  benefit from skilled therapeutic intervention in order to improve the following deficits and impairments:  Hypomobility,Decreased activity tolerance,Pain,Increased fascial restricitons,Decreased mobility,Increased muscle spasms,Impaired perceived functional ability,Improper body mechanics,Impaired flexibility,Postural dysfunction,Decreased coordination,Decreased range of motion  Visit Diagnosis: Cervicalgia  Chronic bilateral low back pain without sciatica  Abnormal posture  Difficulty in walking, not elsewhere classified     Problem List Patient Active Problem List   Diagnosis Date Noted  . Iliotibial band syndrome affecting left lower leg 09/08/2020  . Neck pain on right side 09/05/2020  . OAB (overactive bladder) 07/05/2020  . Pain and swelling of right lower leg 07/05/2020  . Cervicalgia 07/05/2020  . Right knee pain 07/05/2020  . PVC's (premature ventricular contractions) 03/02/2020  . BPH (benign prostatic hyperplasia) 03/02/2020  . Arthritis 03/02/2020  . Diastolic dysfunction 09/98/3382  . Urinary frequency 09/14/2019  . Hoarseness of voice 12/07/2018  . Urinary leakage 12/07/2018  . Left knee pain 12/07/2018  . Chronic sinusitis 10/05/2018  . 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 COPD (chronic obstructive pulmonary disease) ([place]) 05/12/2017  . Pleurisy 03/16/2017  . Bilateral leg pain 11/22/2016  . Insomnia 10/13/2016  . Cough 07/22/2016  . Wheezing 07/22/2016  . Peripheral edema 02/21/2016  . Venous insufficiency 02/05/2016  . Low back pain radiating to lower extremity 02/05/2016  . Vertigo   . Hypersomnolence 10/24/2015  . 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 Chest pain 01/18/2013  . Hematochezia 10/12/2012  . Viral illness 05/26/2012  . Palpitations 01/05/2012  . 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 Fatigue 04/09/2011  . Rash 12/22/2010  . Encounter for long-term (current) use  of high-risk medication 10/08/2010  . Impaired glucose tolerance 10/04/2010  .  Encounter for well adult exam with abnormal findings 10/04/2010  . Carotid artery stenosis 07/01/2009  . PVD 06/25/2009  . Allergic rhinitis 09/16/2007  . ESOPHAGEAL STRICTURE 09/16/2007  . COLONIC POLYPS, HX OF 09/16/2007  . Anxiety state 03/28/2007  . ERECTILE DYSFUNCTION 03/28/2007  . Hyperlipidemia 03/28/2007  . Essential hypertension 03/28/2007  . LOW BACK PAIN 03/28/2007  . OBESITY 03/24/2007  . HEMORRHOIDS, INTERNAL 03/24/2007  . ESOPHAGITIS 03/24/2007  . GERD 03/24/2007    Scot Jun, PT, DPT, OCS, ATC 10/10/20  4:01 PM     [facility] [address] [place], [place], [address] Phone: [phone]   Fax:  [phone]  Name: Warren Weeks MRN: [id number] Date of Birth: April 01, 1946

## 2020-10-10 NOTE — Patient Instructions (Addendum)
Access Code: [email or web address] URL: [email or web address] Date: 10/10/2020 Prepared by: Scot Jun  Exercises Supine Lower Trunk Rotation - 2 x daily - 7 x weekly - 1 sets - 5 reps - 15 hold Supine Cervical Rotation AROM on Pillow - 2 x daily - 7 x weekly - 1-2 sets - 10 reps Seated Scapular Retraction - 2 x daily - 7 x weekly - 2 sets - 10 reps - 5 hold Supine Scapular Retraction - 2 x daily - 7 x weekly - 1 sets - 10 reps - 5 hold Standing Lumbar Extension - 2 x daily - 7 x weekly - 10 reps - 2-3 sets Upper strap stretch 15 sec x 5 bilateral

## 2020-10-16 ENCOUNTER — Ambulatory Visit: Payer: Medicare PPO | Admitting: Physical Therapy

## 2020-10-24 ENCOUNTER — Encounter: Payer: Self-pay | Admitting: Rehabilitative and Restorative Service Providers"

## 2020-10-24 ENCOUNTER — Encounter: Payer: Medicare PPO | Admitting: Rehabilitative and Restorative Service Providers"

## 2020-10-24 ENCOUNTER — Ambulatory Visit: Payer: Medicare PPO | Admitting: Rehabilitative and Restorative Service Providers"

## 2020-10-24 ENCOUNTER — Other Ambulatory Visit: Payer: Self-pay

## 2020-10-24 DIAGNOSIS — R262 Difficulty in walking, not elsewhere classified: Secondary | ICD-10-CM | POA: Diagnosis not present

## 2020-10-24 DIAGNOSIS — M542 Cervicalgia: Secondary | ICD-10-CM

## 2020-10-24 DIAGNOSIS — G8929 Other chronic pain: Secondary | ICD-10-CM | POA: Diagnosis not present

## 2020-10-24 DIAGNOSIS — M545 Low back pain, unspecified: Secondary | ICD-10-CM

## 2020-10-24 DIAGNOSIS — R293 Abnormal posture: Secondary | ICD-10-CM

## 2020-10-24 NOTE — Therapy (Addendum)
[facility] Physical Therapy [address] [place], [place], [address] Phone: [phone]   Fax:  [phone]  Physical Therapy Treatment  Patient Details  Name: Warren Weeks MRN: [id number] Date of Birth: 09/10/1945 Referring Provider (PT): Dwana Melena PA-C   Encounter Date: 10/24/2020   PT End of Session - 10/24/20 1027    Visit Number 2    Number of Visits 12    Date for PT Re-Evaluation 12/19/20    Authorization Type [employer] 12 visits until 12/13/2020    Authorization - Visit Number 2    Authorization - Number of Visits 12    Progress Note Due on Visit 10    PT Start Time 1021    PT Stop Time 1100    PT Time Calculation (min) 39 min    Activity Tolerance Patient tolerated treatment well    Behavior During Therapy [facility] for tasks assessed/performed           Past Medical History:  Diagnosis Date  . ALLERGIC RHINITIS 09/16/2007  . ALLERGY 03/24/2007  . Allergy   . ANXIETY 03/28/2007  . Arthritis    knees, back,shoulder  . CAROTID ARTERY STENOSIS, BILATERAL 07/01/2009   yearly checks - no current problems  . CEPHALGIA 06/08/2009  . CHEST PAIN 09/16/2007   no current problems  . COLONIC POLYPS, HX OF 09/16/2007  . COPD (chronic obstructive pulmonary disease) ([place]) 05/12/2017   mild per patient - no inhaler  . Dizziness and giddiness 06/08/2009  . 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 DYSPNEA/SHORTNESS OF BREATH 09/16/2007  . ERECTILE DYSFUNCTION 03/28/2007  . ESOPHAGEAL STRICTURE 09/16/2007  . ESOPHAGITIS 03/24/2007  . GERD 03/24/2007  . GLUCOSE INTOLERANCE 09/16/2007  . HEMORRHOIDS, INTERNAL 03/24/2007  . HYPERCHOLESTEROLEMIA 03/24/2007  . HYPERLIPIDEMIA 09/16/2007  . HYPERTENSION 03/28/2007  . 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 Impaired glucose tolerance 10/04/2010  . LOW BACK PAIN 03/28/2007  . MAGNETIC RESONANCE IMAGING, BRAIN, ABNORMAL 06/26/2009  . NECK PAIN, LEFT 06/20/2010  . OBESITY 03/24/2007  . OTITIS MEDIA, LEFT 06/10/2009  . Palpitations 06/20/2010    no current problems  . PVD 06/25/2009  . RASH-NONVESICULAR 06/17/2009  . URI 08/16/2009    Past Surgical History:  Procedure Laterality Date  . COLONOSCOPY  03/2013   polyps/[place]  . s/p right knee arthroscopy    . UPPER GASTROINTESTINAL ENDOSCOPY     gerd    There were no vitals filed for this visit.   Subjective Assessment - 10/24/20 1024    Subjective Pt. stated having similar overall complaints in neck and back overall but reported some spasms based pain as most concern vs. some of the previous middle of neck complaints.   Short term improvement of symptoms c HEP.    Pertinent History anxiety, GERD, hypercholesterolemia, HTN, PVD    Limitations House hold activities;Walking;Standing;Lifting;Sitting    Diagnostic tests xrays showed arthropathy    Patient Stated Goals Reduce pain    Currently in Pain? Yes    Pain Score 7     Pain Location Neck    Pain Orientation Left;Right    Pain Descriptors / Indicators Tightness;Spasm;Aching    Pain Type Chronic pain    Pain Onset More than a month ago    Pain Frequency Intermittent    Aggravating Factors  insidious worsening/spasms, sometimes c movement worsening    Pain Relieving Factors HEP short term relief    Pain Score 6    Pain Location Back    Pain Orientation Lower    Pain Descriptors /  Indicators Spasm;Aching    Pain Type Chronic pain    Pain Onset More than a month ago    Pain Frequency Intermittent    Aggravating Factors  spasms insidous    Pain Relieving Factors rest              [facility] PT Assessment - 10/24/20 0001      Assessment   Medical Diagnosis Neck/back pain    Referring Provider (PT) Dwana Melena PA-C    Onset Date/Surgical Date 08/06/20    Hand Dominance Right      AROM   Cervical - Right Rotation 66      Palpation   Spinal mobility cervical downslope accessory movement WFL bilateral throughout C2-C7                     10/24/20 0001  Exercises  Exercises Neck;Lumbar  Neck  Exercises: Supine  Other Supine Exercise upper cervical flexion hold 5 sec x 10  Other Supine Exercise scapular retraction 5 sec hold x 10, scap retraction c arm extension into table 5 sec hold x 10  Lumbar Exercises: Stretches  Lower Trunk Rotation 5 reps (15 sec x 5 bilateral)  Lumbar Exercises: Supine  Bridge 20 reps  Manual Therapy  Manual therapy comments supine cervical downslope g2-g3 mobilizations bilateral to mid and lower cervical joints            PT Education - 10/24/20 1044    Education Details HEP review, new additions    Person(s) Educated Patient    Methods Explanation;Demonstration;Verbal cues;Handout    Comprehension Returned demonstration;Verbalized understanding            PT Short Term Goals - 10/24/20 1044      PT SHORT TERM GOAL #1   Title Patient will demonstrate independent use of home exercise program to maintain progress from in clinic treatments.    Time 3    Period Weeks    Status On-going    Target Date 10/31/20             PT Long Term Goals - 10/10/20 1440      PT LONG TERM GOAL #1   Title Patient will demonstrate/report pain at worst less than or equal to 2/10 to facilitate minimal limitation in daily activity secondary to pain symptoms.    Time 10    Period Weeks    Status New    Target Date 12/19/20      PT LONG TERM GOAL #2   Title Patient will demonstrate independent use of home exercise program to facilitate ability to maintain/progress functional gains from skilled physical therapy services.    Time 10    Period Weeks    Status New    Target Date 12/19/20      PT LONG TERM GOAL #3   Title Pt. will demonstrate cervical AROM WFL s symptoms to facilitate head turning, driving, looking around at PLOF s limitation.    Time 10    Period Weeks    Status New    Target Date 12/19/20      PT LONG TERM GOAL #4   Title Pt. will demonstrate lumbar ext 100 % WFL s symptoms to facilitate upright standing, walking posture at  PLOF.    Time 10    Period Weeks    Status New    Target Date 12/19/20      PT LONG TERM GOAL #5   Title Pt. will demonstrate/report ability to  perform usual yard work, house activity at [employer] s restriction including sitting/standing.    Time 10    Period Weeks    Status New    Target Date 12/19/20      Additional Long Term Goals   Additional Long Term Goals Yes      PT LONG TERM GOAL #6   Title Pt. will demonstrate FOTO improvement > or = 71 to indicated reduced disability due to condition.    Time 10    Period Weeks    Status New    Target Date 12/19/20                 Plan - 10/24/20 1037    Clinical Impression Statement Assessment today of supine cervical downslope accessory movement revealed no specific mobiity restriction.  Myofascial restriction continued to be noted in Rt upper trap, Rt levator scap and also in suboccipitals today c concordant symptoms indicated upon palpation.    Personal Factors and Comorbidities Comorbidity 3+    Comorbidities anxiety, GERD, hypercholesterolemia, HTN, PVD    Examination-Activity Limitations Transfers;Stand;Sleep;Sit;Bend    Examination-Participation Restrictions Community Activity;Yard Work;Cleaning;Laundry;Meal Prep    Stability/Clinical Decision Making Evolving/Moderate complexity    Rehab Potential --   fair to good   PT Frequency --   2x/week   PT Duration --   10 wks   PT Treatment/Interventions ADLs/Self Care Home Management;Cryotherapy;Electrical Stimulation;Iontophoresis 4mg /ml Dexamethasone;Moist Heat;Traction;Balance training;Therapeutic exercise;Therapeutic activities;Functional mobility training;Stair training;Gait training;DME Instruction;Ultrasound;Neuromuscular re-education;Patient/family education;Passive range of motion;Spinal Manipulations;Joint Manipulations;Dry needling;Taping;Manual techniques    PT Next Visit Plan Conitnue myofascial release techniques in cervical region, possible dry needling if desired,   Continue postural activation    PT Home Exercise Plan [email or web address]    Consulted and Agree with Plan of Care Patient           Patient will benefit from skilled therapeutic intervention in order to improve the following deficits and impairments:  Hypomobility,Decreased activity tolerance,Pain,Increased fascial restricitons,Decreased mobility,Increased muscle spasms,Impaired perceived functional ability,Improper body mechanics,Impaired flexibility,Postural dysfunction,Decreased coordination,Decreased range of motion  Visit Diagnosis: Cervicalgia  Chronic bilateral low back pain without sciatica  Abnormal posture  Difficulty in walking, not elsewhere classified     Problem List Patient Active Problem List   Diagnosis Date Noted  . Iliotibial band syndrome affecting left lower leg 09/08/2020  . Neck pain on right side 09/05/2020  . OAB (overactive bladder) 07/05/2020  . Pain and swelling of right lower leg 07/05/2020  . Cervicalgia 07/05/2020  . Right knee pain 07/05/2020  . PVC's (premature ventricular contractions) 03/02/2020  . BPH (benign prostatic hyperplasia) 03/02/2020  . Arthritis 03/02/2020  . Diastolic dysfunction 13/24/4010  . Urinary frequency 09/14/2019  . Hoarseness of voice 12/07/2018  . Urinary leakage 12/07/2018  . Left knee pain 12/07/2018  . Chronic sinusitis 10/05/2018  . 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 COPD (chronic obstructive pulmonary disease) ([place]) 05/12/2017  . Pleurisy 03/16/2017  . Bilateral leg pain 11/22/2016  . Insomnia 10/13/2016  . Cough 07/22/2016  . Wheezing 07/22/2016  . Peripheral edema 02/21/2016  . Venous insufficiency 02/05/2016  . Low back pain radiating to lower extremity 02/05/2016  . Vertigo   . Hypersomnolence 10/24/2015  . 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 Chest pain 01/18/2013  . Hematochezia 10/12/2012  . Viral illness 05/26/2012  . Palpitations 01/05/2012  . 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 Fatigue 04/09/2011   . Rash 12/22/2010  . Encounter for long-term (current) use of high-risk medication 10/08/2010  . Impaired glucose tolerance 10/04/2010  .  Encounter for well adult exam with abnormal findings 10/04/2010  . Carotid artery stenosis 07/01/2009  . PVD 06/25/2009  . Allergic rhinitis 09/16/2007  . ESOPHAGEAL STRICTURE 09/16/2007  . COLONIC POLYPS, HX OF 09/16/2007  . Anxiety state 03/28/2007  . ERECTILE DYSFUNCTION 03/28/2007  . Hyperlipidemia 03/28/2007  . Essential hypertension 03/28/2007  . LOW BACK PAIN 03/28/2007  . OBESITY 03/24/2007  . HEMORRHOIDS, INTERNAL 03/24/2007  . ESOPHAGITIS 03/24/2007  . GERD 03/24/2007   Scot Jun, PT, DPT, OCS, ATC 10/24/20  10:56 AM   addended to include manual documentation in flow sheet: Scot Jun, PT, DPT, OCS, ATC 11/06/20  9:17 AM       [facility] Physical Therapy [address] [place], [place], [address] Phone: [phone]   Fax:  [phone]  Name: Warren Weeks MRN: [id number] Date of Birth: Oct 31, 1945

## 2020-10-24 NOTE — Patient Instructions (Signed)
Access Code: [email or web address] URL: [email or web address] Date: 10/24/2020 Prepared by: Scot Jun  Exercises Supine Lower Trunk Rotation - 2 x daily - 7 x weekly - 1 sets - 5 reps - 15 hold Supine Cervical Rotation AROM on Pillow - 2 x daily - 7 x weekly - 1-2 sets - 10 reps Seated Scapular Retraction - 2 x daily - 7 x weekly - 2 sets - 10 reps - 5 hold Supine Scapular Retraction - 2 x daily - 7 x weekly - 1 sets - 10 reps - 5 hold Standing Lumbar Extension - 2 x daily - 7 x weekly - 10 reps - 2-3 sets Seated Upper Trapezius Stretch - 2 x daily - 7 x weekly - 1 sets - 15 reps - 5 hold Supine Bridge - 2 x daily - 7 x weekly - 10 reps - 3 sets - 2 hold

## 2020-11-06 ENCOUNTER — Encounter: Payer: Self-pay | Admitting: Rehabilitative and Restorative Service Providers"

## 2020-11-06 ENCOUNTER — Other Ambulatory Visit: Payer: Self-pay

## 2020-11-06 ENCOUNTER — Ambulatory Visit: Payer: Medicare PPO | Admitting: Rehabilitative and Restorative Service Providers"

## 2020-11-06 DIAGNOSIS — M542 Cervicalgia: Secondary | ICD-10-CM | POA: Diagnosis not present

## 2020-11-06 DIAGNOSIS — R262 Difficulty in walking, not elsewhere classified: Secondary | ICD-10-CM

## 2020-11-06 DIAGNOSIS — G8929 Other chronic pain: Secondary | ICD-10-CM

## 2020-11-06 DIAGNOSIS — R293 Abnormal posture: Secondary | ICD-10-CM | POA: Diagnosis not present

## 2020-11-06 DIAGNOSIS — M545 Low back pain, unspecified: Secondary | ICD-10-CM

## 2020-11-06 NOTE — Therapy (Signed)
[facility] Physical Therapy [address] [place], [place], [address] Phone: [phone]   Fax:  [phone]  Physical Therapy Treatment  Patient Details  Name: Warren Weeks MRN: [id number] Date of Birth: 1945-08-14 Referring Provider (PT): Dwana Melena PA-C   Encounter Date: 11/06/2020   PT End of Session - 11/06/20 0957    Visit Number 3    Number of Visits 12    Date for PT Re-Evaluation 12/19/20    Authorization Type [employer] 12 visits until 12/13/2020    Authorization - Visit Number 3    Authorization - Number of Visits 12    Progress Note Due on Visit 10    PT Start Time 0934    PT Stop Time 1016    PT Time Calculation (min) 42 min    Activity Tolerance Patient tolerated treatment well    Behavior During Therapy [facility] for tasks assessed/performed           Past Medical History:  Diagnosis Date  . ALLERGIC RHINITIS 09/16/2007  . ALLERGY 03/24/2007  . Allergy   . ANXIETY 03/28/2007  . Arthritis    knees, back,shoulder  . CAROTID ARTERY STENOSIS, BILATERAL 07/01/2009   yearly checks - no current problems  . CEPHALGIA 06/08/2009  . CHEST PAIN 09/16/2007   no current problems  . COLONIC POLYPS, HX OF 09/16/2007  . COPD (chronic obstructive pulmonary disease) ([place]) 05/12/2017   mild per patient - no inhaler  . Dizziness and giddiness 06/08/2009  . 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 DYSPNEA/SHORTNESS OF BREATH 09/16/2007  . ERECTILE DYSFUNCTION 03/28/2007  . ESOPHAGEAL STRICTURE 09/16/2007  . ESOPHAGITIS 03/24/2007  . GERD 03/24/2007  . GLUCOSE INTOLERANCE 09/16/2007  . HEMORRHOIDS, INTERNAL 03/24/2007  . HYPERCHOLESTEROLEMIA 03/24/2007  . HYPERLIPIDEMIA 09/16/2007  . HYPERTENSION 03/28/2007  . 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 Impaired glucose tolerance 10/04/2010  . LOW BACK PAIN 03/28/2007  . MAGNETIC RESONANCE IMAGING, BRAIN, ABNORMAL 06/26/2009  . NECK PAIN, LEFT 06/20/2010  . OBESITY 03/24/2007  . OTITIS MEDIA, LEFT 06/10/2009  . Palpitations 06/20/2010    no current problems  . PVD 06/25/2009  . RASH-NONVESICULAR 06/17/2009  . URI 08/16/2009    Past Surgical History:  Procedure Laterality Date  . COLONOSCOPY  03/2013   polyps/[place]  . s/p right knee arthroscopy    . UPPER GASTROINTESTINAL ENDOSCOPY     gerd    There were no vitals filed for this visit.   Subjective Assessment - 11/06/20 0937    Subjective Pt. indicated neck symptoms about 7/10 c movement to Rt most noted but Lt sometimes but not as bad.  Pt. indicated feeling good days and bad days.    Pertinent History anxiety, GERD, hypercholesterolemia, HTN, PVD    Limitations House hold activities;Walking;Standing;Lifting;Sitting    Diagnostic tests xrays showed arthropathy    Patient Stated Goals Reduce pain    Currently in Pain? Yes    Pain Location Neck    Pain Orientation Left;Right    Pain Descriptors / Indicators Tightness;Sharp;Aching    Pain Type Chronic pain    Pain Onset More than a month ago    Pain Frequency Intermittent    Aggravating Factors  head movement    Pain Relieving Factors muscle relaxer, sometimes c exercise routine.    Pain Score 0    Pain Onset More than a month ago              [facility] PT Assessment - 11/06/20 0001      Assessment  Medical Diagnosis Neck/back pain    Referring Provider (PT) Dwana Melena PA-C    Onset Date/Surgical Date 08/06/20    Hand Dominance Right      AROM   Cervical - Right Rotation 70   pain noted in Rt cervical   Cervical - Left Rotation 71      Palpation   Spinal mobility Noted hypomobility throughout cPA in upper and mid thoracic region                         [facility] Adult PT Treatment/Exercise - 11/06/20 0001      Neck Exercises: Seated   Other Seated Exercise scapular retraction 5 sec hold x 10    Other Seated Exercise seated thoracic extension in chair 10 x for 3 second hold      Lumbar Exercises: Aerobic   Nustep Lvl 5 10 mins UE/LE      Manual Therapy   Manual therapy comments  compression to Rt upper trap, prone cPA g4 T3-T6      Neck Exercises: Stretches   Upper Trapezius Stretch Right   15 seconds x 5           Trigger Point Dry Needling - 11/06/20 0001    Consent Given? Yes    Education Handout Provided Yes    Muscles Treated Head and Neck Upper trapezius   Rt   Upper Trapezius Response Twitch reponse elicited                  PT Short Term Goals - 10/24/20 1044      PT SHORT TERM GOAL #1   Title Patient will demonstrate independent use of home exercise program to maintain progress from in clinic treatments.    Time 3    Period Weeks    Status On-going    Target Date 10/31/20             PT Long Term Goals - 10/10/20 1440      PT LONG TERM GOAL #1   Title Patient will demonstrate/report pain at worst less than or equal to 2/10 to facilitate minimal limitation in daily activity secondary to pain symptoms.    Time 10    Period Weeks    Status New    Target Date 12/19/20      PT LONG TERM GOAL #2   Title Patient will demonstrate independent use of home exercise program to facilitate ability to maintain/progress functional gains from skilled physical therapy services.    Time 10    Period Weeks    Status New    Target Date 12/19/20      PT LONG TERM GOAL #3   Title Pt. will demonstrate cervical AROM WFL s symptoms to facilitate head turning, driving, looking around at PLOF s limitation.    Time 10    Period Weeks    Status New    Target Date 12/19/20      PT LONG TERM GOAL #4   Title Pt. will demonstrate lumbar ext 100 % WFL s symptoms to facilitate upright standing, walking posture at PLOF.    Time 10    Period Weeks    Status New    Target Date 12/19/20      PT LONG TERM GOAL #5   Title Pt. will demonstrate/report ability to perform usual yard work, house activity at [employer] s restriction including sitting/standing.    Time 10    Period Weeks  Status New    Target Date 12/19/20      Additional Long Term Goals    Additional Long Term Goals Yes      PT LONG TERM GOAL #6   Title Pt. will demonstrate FOTO improvement > or = 71 to indicated reduced disability due to condition.    Time 10    Period Weeks    Status New    Target Date 12/19/20                 Plan - 11/06/20 1000    Clinical Impression Statement Presentation of cervicothoracic hypomobility present and noted as well as myofascial complaints, primarily on Rt side.  Improved cervical rotation symptoms and quantity of movement compared to evaluation post manual intervention today.   Pt. may continue to benefit from skilled PT services to continue to improve mobility and symptoms.    Personal Factors and Comorbidities Comorbidity 3+    Comorbidities anxiety, GERD, hypercholesterolemia, HTN, PVD    Examination-Activity Limitations Transfers;Stand;Sleep;Sit;Bend    Examination-Participation Restrictions Community Activity;Yard Work;Cleaning;Laundry;Meal Prep    Stability/Clinical Decision Making Evolving/Moderate complexity    Rehab Potential --   fair to good   PT Frequency --   2x/week   PT Duration --   10 wks   PT Treatment/Interventions ADLs/Self Care Home Management;Cryotherapy;Electrical Stimulation;Iontophoresis 4mg /ml Dexamethasone;Moist Heat;Traction;Balance training;Therapeutic exercise;Therapeutic activities;Functional mobility training;Stair training;Gait training;DME Instruction;Ultrasound;Neuromuscular re-education;Patient/family education;Passive range of motion;Spinal Manipulations;Joint Manipulations;Dry needling;Taping;Manual techniques    PT Next Visit Plan Revisit dry needling, manual response, thoracic mobilization/manipulation.  Continue postural activation and mobility gains.    PT Home Exercise Plan [email or web address]    Consulted and Agree with Plan of Care Patient           Patient will benefit from skilled therapeutic intervention in order to improve the following deficits and impairments:  Hypomobility,Decreased  activity tolerance,Pain,Increased fascial restricitons,Decreased mobility,Increased muscle spasms,Impaired perceived functional ability,Improper body mechanics,Impaired flexibility,Postural dysfunction,Decreased coordination,Decreased range of motion  Visit Diagnosis: Cervicalgia  Chronic bilateral low back pain without sciatica  Abnormal posture  Difficulty in walking, not elsewhere classified     Problem List Patient Active Problem List   Diagnosis Date Noted  . Iliotibial band syndrome affecting left lower leg 09/08/2020  . Neck pain on right side 09/05/2020  . OAB (overactive bladder) 07/05/2020  . Pain and swelling of right lower leg 07/05/2020  . Cervicalgia 07/05/2020  . Right knee pain 07/05/2020  . PVC's (premature ventricular contractions) 03/02/2020  . BPH (benign prostatic hyperplasia) 03/02/2020  . Arthritis 03/02/2020  . Diastolic dysfunction 54/00/8676  . Urinary frequency 09/14/2019  . Hoarseness of voice 12/07/2018  . Urinary leakage 12/07/2018  . Left knee pain 12/07/2018  . Chronic sinusitis 10/05/2018  . 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 COPD (chronic obstructive pulmonary disease) ([place]) 05/12/2017  . Pleurisy 03/16/2017  . Bilateral leg pain 11/22/2016  . Insomnia 10/13/2016  . Cough 07/22/2016  . Wheezing 07/22/2016  . Peripheral edema 02/21/2016  . Venous insufficiency 02/05/2016  . Low back pain radiating to lower extremity 02/05/2016  . Vertigo   . Hypersomnolence 10/24/2015  . 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 Chest pain 01/18/2013  . Hematochezia 10/12/2012  . Viral illness 05/26/2012  . Palpitations 01/05/2012  . 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 Fatigue 04/09/2011  . Rash 12/22/2010  . Encounter for long-term (current) use of high-risk medication 10/08/2010  . Impaired glucose tolerance 10/04/2010  . Encounter for well adult exam with abnormal findings 10/04/2010  . Carotid artery  stenosis 07/01/2009  . PVD 06/25/2009  .  Allergic rhinitis 09/16/2007  . ESOPHAGEAL STRICTURE 09/16/2007  . COLONIC POLYPS, HX OF 09/16/2007  . Anxiety state 03/28/2007  . ERECTILE DYSFUNCTION 03/28/2007  . Hyperlipidemia 03/28/2007  . Essential hypertension 03/28/2007  . LOW BACK PAIN 03/28/2007  . OBESITY 03/24/2007  . HEMORRHOIDS, INTERNAL 03/24/2007  . ESOPHAGITIS 03/24/2007  . GERD 03/24/2007    Scot Jun, PT, DPT, OCS, ATC 11/06/20  10:09 AM    [facility] Physical Therapy [address] [place], [place], [address] Phone: [phone]   Fax:  [phone]  Name: Warren Weeks MRN: [id number] Date of Birth: 01-10-1946

## 2020-11-07 ENCOUNTER — Other Ambulatory Visit: Payer: Self-pay | Admitting: Internal Medicine

## 2020-11-08 ENCOUNTER — Encounter: Payer: Self-pay | Admitting: Rehabilitative and Restorative Service Providers"

## 2020-11-08 ENCOUNTER — Ambulatory Visit: Payer: Medicare PPO | Admitting: Rehabilitative and Restorative Service Providers"

## 2020-11-08 ENCOUNTER — Other Ambulatory Visit: Payer: Self-pay

## 2020-11-08 DIAGNOSIS — R293 Abnormal posture: Secondary | ICD-10-CM

## 2020-11-08 DIAGNOSIS — M545 Low back pain, unspecified: Secondary | ICD-10-CM | POA: Diagnosis not present

## 2020-11-08 DIAGNOSIS — M542 Cervicalgia: Secondary | ICD-10-CM

## 2020-11-08 DIAGNOSIS — R262 Difficulty in walking, not elsewhere classified: Secondary | ICD-10-CM

## 2020-11-08 DIAGNOSIS — G8929 Other chronic pain: Secondary | ICD-10-CM | POA: Diagnosis not present

## 2020-11-08 NOTE — Therapy (Signed)
[facility] Physical Therapy [address] [place], [place], [address] Phone: [phone]   Fax:  [phone]  Physical Therapy Treatment  Patient Details  Name: Warren Weeks MRN: [id number] Date of Birth: [birth year] Referring Provider (PT): Dwana Melena PA-C   Encounter Date: 11/08/2020   PT End of Session - 11/08/20 0952    Visit Number 4    Number of Visits 12    Date for PT Re-Evaluation 12/19/20    Authorization Type [employer] 12 visits until 12/13/2020    Authorization - Visit Number 4    Authorization - Number of Visits 12    Progress Note Due on Visit 10    PT Start Time 0930    PT Stop Time 1010    PT Time Calculation (min) 40 min    Activity Tolerance Patient tolerated treatment well    Behavior During Therapy [facility] for tasks assessed/performed           Past Medical History:  Diagnosis Date  . ALLERGIC RHINITIS 09/16/2007  . ALLERGY 03/24/2007  . Allergy   . ANXIETY 03/28/2007  . Arthritis    knees, back,shoulder  . CAROTID ARTERY STENOSIS, BILATERAL 07/01/2009   yearly checks - no current problems  . CEPHALGIA 06/08/2009  . CHEST PAIN 09/16/2007   no current problems  . COLONIC POLYPS, HX OF 09/16/2007  . COPD (chronic obstructive pulmonary disease) ([place]) 05/12/2017   mild per patient - no inhaler  . Dizziness and giddiness 06/08/2009  . 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 DYSPNEA/SHORTNESS OF BREATH 09/16/2007  . ERECTILE DYSFUNCTION 03/28/2007  . ESOPHAGEAL STRICTURE 09/16/2007  . ESOPHAGITIS 03/24/2007  . GERD 03/24/2007  . GLUCOSE INTOLERANCE 09/16/2007  . HEMORRHOIDS, INTERNAL 03/24/2007  . HYPERCHOLESTEROLEMIA 03/24/2007  . HYPERLIPIDEMIA 09/16/2007  . HYPERTENSION 03/28/2007  . 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 Impaired glucose tolerance 10/04/2010  . LOW BACK PAIN 03/28/2007  . MAGNETIC RESONANCE IMAGING, BRAIN, ABNORMAL 06/26/2009  . NECK PAIN, LEFT 06/20/2010  . OBESITY 03/24/2007  . OTITIS MEDIA, LEFT 06/10/2009  . Palpitations 06/20/2010    no current problems  . PVD 06/25/2009  . RASH-NONVESICULAR 06/17/2009  . URI 08/16/2009    Past Surgical History:  Procedure Laterality Date  . COLONOSCOPY  03/2013   polyps/[place]  . s/p right knee arthroscopy    . UPPER GASTROINTESTINAL ENDOSCOPY     gerd    There were no vitals filed for this visit.   Subjective Assessment - 11/08/20 0934    Subjective Pt. indicated he was sore after last visit (dry needling sore).  Pt. indicated he didn't seem to think pain improved overall since last visit.  Pt. indicated quick pains a few times without movement while driving. Pt. stated pain in Rt side of neck upon arrival today.    Pertinent History anxiety, GERD, hypercholesterolemia, HTN, PVD    Limitations House hold activities;Walking;Standing;Lifting;Sitting    Diagnostic tests xrays showed arthropathy    Patient Stated Goals Reduce pain    Currently in Pain? Yes    Pain Score 7     Pain Location Neck    Pain Orientation Right    Pain Descriptors / Indicators Tightness;Sharp;Aching    Pain Onset More than a month ago    Pain Frequency Intermittent    Pain Onset More than a month ago                             [facility] Adult PT Treatment/Exercise -  11/08/20 0001      Neck Exercises: Theraband   Other Theraband Exercises supine horizontal abduction green band 20x      Neck Exercises: Supine   Neck Retraction 5 secs;10 reps    Other Supine Exercise scapular retraction 5 sec hold x 10, scap retraction c arm extension into table 5 sec hold x 10      Lumbar Exercises: Stretches   Lower Trunk Rotation 5 reps   15 seconds bilateral     Lumbar Exercises: Aerobic   Nustep Lvl 5 10 mins UE/LE      Lumbar Exercises: Standing   Other Standing Lumbar Exercises standing lumbar extension x 5      Lumbar Exercises: Supine   Bridge 20 reps      Manual Therapy   Manual therapy comments compression to Rt upper trap, prone cPA G3 L2-L5            Trigger Point Dry  Needling - 11/08/20 0001    Consent Given? Yes    Education Handout Provided Previously provided    Muscles Treated Head and Neck Upper trapezius;Cervical multifidi    Upper Trapezius Response Twitch reponse elicited    Cervical multifidi Response Twitch reponse elicited                  PT Short Term Goals - 11/08/20 4034      PT SHORT TERM GOAL #1   Title Patient will demonstrate independent use of home exercise program to maintain progress from in clinic treatments.    Time 3    Period Weeks    Status Achieved    Target Date 10/31/20             PT Long Term Goals - 10/10/20 1440      PT LONG TERM GOAL #1   Title Patient will demonstrate/report pain at worst less than or equal to 2/10 to facilitate minimal limitation in daily activity secondary to pain symptoms.    Time 10    Period Weeks    Status New    Target Date 12/19/20      PT LONG TERM GOAL #2   Title Patient will demonstrate independent use of home exercise program to facilitate ability to maintain/progress functional gains from skilled physical therapy services.    Time 10    Period Weeks    Status New    Target Date 12/19/20      PT LONG TERM GOAL #3   Title Pt. will demonstrate cervical AROM WFL s symptoms to facilitate head turning, driving, looking around at PLOF s limitation.    Time 10    Period Weeks    Status New    Target Date 12/19/20      PT LONG TERM GOAL #4   Title Pt. will demonstrate lumbar ext 100 % WFL s symptoms to facilitate upright standing, walking posture at PLOF.    Time 10    Period Weeks    Status New    Target Date 12/19/20      PT LONG TERM GOAL #5   Title Pt. will demonstrate/report ability to perform usual yard work, house activity at [employer] s restriction including sitting/standing.    Time 10    Period Weeks    Status New    Target Date 12/19/20      Additional Long Term Goals   Additional Long Term Goals Yes      PT LONG TERM GOAL #6  Title Pt. will  demonstrate FOTO improvement > or = 71 to indicated reduced disability due to condition.    Time 10    Period Weeks    Status New    Target Date 12/19/20                 Plan - 11/08/20 0949    Clinical Impression Statement Concordant pain indicated from L4, L5 cPA c mild hypomobility in area.  Rt cervical paraspinals and Rt upper trap continued to present c concordant symptoms from trigger point palpation/compression and dry needling twitch response.  Continued focus of intervention on promoting improved mobility in cervical and lumbar regions.   Conitnued symptom presentation c limitation in daily activity reported at this time.    Personal Factors and Comorbidities Comorbidity 3+    Comorbidities anxiety, GERD, hypercholesterolemia, HTN, PVD    Examination-Activity Limitations Transfers;Stand;Sleep;Sit;Bend    Examination-Participation Restrictions Community Activity;Yard Work;Cleaning;Laundry;Meal Prep    Stability/Clinical Decision Making Evolving/Moderate complexity    Rehab Potential --   fair to good   PT Frequency --   2x/week   PT Duration --   10 wks   PT Treatment/Interventions ADLs/Self Care Home Management;Cryotherapy;Electrical Stimulation;Iontophoresis 4mg /ml Dexamethasone;Moist Heat;Traction;Balance training;Therapeutic exercise;Therapeutic activities;Functional mobility training;Stair training;Gait training;DME Instruction;Ultrasound;Neuromuscular re-education;Patient/family education;Passive range of motion;Spinal Manipulations;Joint Manipulations;Dry needling;Taping;Manual techniques    PT Next Visit Plan Reassess dry needling, manual response.  Continue postural activation and mobility gains.    PT Home Exercise Plan [email or web address]    Consulted and Agree with Plan of Care Patient           Patient will benefit from skilled therapeutic intervention in order to improve the following deficits and impairments:  Hypomobility,Decreased activity tolerance,Pain,Increased  fascial restricitons,Decreased mobility,Increased muscle spasms,Impaired perceived functional ability,Improper body mechanics,Impaired flexibility,Postural dysfunction,Decreased coordination,Decreased range of motion  Visit Diagnosis: Cervicalgia  Chronic bilateral low back pain without sciatica  Abnormal posture  Difficulty in walking, not elsewhere classified     Problem List Patient Active Problem List   Diagnosis Date Noted  . Iliotibial band syndrome affecting left lower leg 09/08/2020  . Neck pain on right side 09/05/2020  . OAB (overactive bladder) 07/05/2020  . Pain and swelling of right lower leg 07/05/2020  . Cervicalgia 07/05/2020  . Right knee pain 07/05/2020  . PVC's (premature ventricular contractions) 03/02/2020  . BPH (benign prostatic hyperplasia) 03/02/2020  . Arthritis 03/02/2020  . Diastolic dysfunction 31/49/7026  . Urinary frequency 09/14/2019  . Hoarseness of voice 12/07/2018  . Urinary leakage 12/07/2018  . Left knee pain 12/07/2018  . Chronic sinusitis 10/05/2018  . 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 COPD (chronic obstructive pulmonary disease) ([place]) 05/12/2017  . Pleurisy 03/16/2017  . Bilateral leg pain 11/22/2016  . Insomnia 10/13/2016  . Cough 07/22/2016  . Wheezing 07/22/2016  . Peripheral edema 02/21/2016  . Venous insufficiency 02/05/2016  . Low back pain radiating to lower extremity 02/05/2016  . Vertigo   . Hypersomnolence 10/24/2015  . 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 Chest pain 01/18/2013  . Hematochezia 10/12/2012  . Viral illness 05/26/2012  . Palpitations 01/05/2012  . 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 Fatigue 04/09/2011  . Rash 12/22/2010  . Encounter for long-term (current) use of high-risk medication 10/08/2010  . Impaired glucose tolerance 10/04/2010  . Encounter for well adult exam with abnormal findings 10/04/2010  . Carotid artery stenosis 07/01/2009  . PVD 06/25/2009  . Allergic rhinitis 09/16/2007  .  ESOPHAGEAL STRICTURE 09/16/2007  . COLONIC POLYPS, HX OF 09/16/2007  .  Anxiety state 03/28/2007  . ERECTILE DYSFUNCTION 03/28/2007  . Hyperlipidemia 03/28/2007  . Essential hypertension 03/28/2007  . LOW BACK PAIN 03/28/2007  . OBESITY 03/24/2007  . HEMORRHOIDS, INTERNAL 03/24/2007  . ESOPHAGITIS 03/24/2007  . GERD 03/24/2007    Scot Jun, PT, DPT, OCS, ATC 11/08/20  10:02 AM    [facility] Physical Therapy [address] [place], [place], [address] Phone: [phone]   Fax:  [phone]  Name: MUKUL KRAHMER MRN: [id number] Date of Birth: 1945-07-09

## 2020-11-08 NOTE — Telephone Encounter (Signed)
Please refill as per office routine med refill policy (all routine meds refilled for 3 mo or monthly per pt preference up to one year from last visit, then month to month grace period for 3 mo, then further med refills will have to be denied)  

## 2020-11-12 NOTE — Telephone Encounter (Signed)
1.Medication Requested: metoprolol tartrate (LOPRESSOR) 50 MG tablet  fluticasone (FLONASE) 50 MCG/ACT nasal spray    2. Pharmacy (Name, Street, Scenic): [place] ([place] [place] - [address]  3. On Med List: yes   4. Last Visit with PCP: 09-05-20  5. Next visit date with PCP: 03-12-21   Agent: Please be advised that RX refills may take up to 3 business days. We ask that you follow-up with your pharmacy.

## 2020-11-13 ENCOUNTER — Other Ambulatory Visit: Payer: Self-pay

## 2020-11-13 ENCOUNTER — Encounter: Payer: Self-pay | Admitting: Rehabilitative and Restorative Service Providers"

## 2020-11-13 ENCOUNTER — Ambulatory Visit: Payer: Medicare PPO | Admitting: Rehabilitative and Restorative Service Providers"

## 2020-11-13 DIAGNOSIS — G8929 Other chronic pain: Secondary | ICD-10-CM | POA: Diagnosis not present

## 2020-11-13 DIAGNOSIS — M545 Low back pain, unspecified: Secondary | ICD-10-CM

## 2020-11-13 DIAGNOSIS — M542 Cervicalgia: Secondary | ICD-10-CM

## 2020-11-13 DIAGNOSIS — R262 Difficulty in walking, not elsewhere classified: Secondary | ICD-10-CM

## 2020-11-13 DIAGNOSIS — R293 Abnormal posture: Secondary | ICD-10-CM | POA: Diagnosis not present

## 2020-11-13 NOTE — Telephone Encounter (Signed)
Patient called again in regards to med refill. He said that he is out of metoprolol tartrate (LOPRESSOR) 50 MG tablet. Please advise

## 2020-11-13 NOTE — Patient Instructions (Signed)
Access Code: [email or web address] URL: [email or web address] Date: 11/13/2020 Prepared by: Scot Jun  Exercises Supine Lower Trunk Rotation - 2 x daily - 7 x weekly - 1 sets - 5 reps - 15 hold Supine Cervical Rotation AROM on Pillow - 2 x daily - 7 x weekly - 1-2 sets - 10 reps Seated Scapular Retraction - 2 x daily - 7 x weekly - 2 sets - 10 reps - 5 hold Standing Lumbar Extension - 2 x daily - 7 x weekly - 10 reps - 2-3 sets Seated Upper Trapezius Stretch - 2 x daily - 7 x weekly - 1 sets - 15 reps - 5 hold Supine Bridge - 2 x daily - 7 x weekly - 10 reps - 3 sets - 2 hold Gentle Levator Scapulae Stretch (Mirrored) - 2 x daily - 7 x weekly - 1 sets - 5 reps - 15 hold

## 2020-11-13 NOTE — Therapy (Signed)
[facility] Physical Therapy [address] [place], [place], [address] Phone: [phone]   Fax:  [phone]  Physical Therapy Treatment  Patient Details  Name: Warren Weeks MRN: [id number] Date of Birth: March 18, 1946 Referring Provider (PT): Dwana Melena PA-C   Encounter Date: 11/13/2020   PT End of Session - 11/13/20 0956    Visit Number 5    Number of Visits 12    Date for PT Re-Evaluation 12/19/20    Authorization Type [employer] 12 visits until 12/13/2020    Authorization - Visit Number 5    Authorization - Number of Visits 12    Progress Note Due on Visit 10    PT Start Time 0930    PT Stop Time 1010    PT Time Calculation (min) 40 min    Activity Tolerance Patient tolerated treatment well    Behavior During Therapy [facility] for tasks assessed/performed           Past Medical History:  Diagnosis Date  . ALLERGIC RHINITIS 09/16/2007  . ALLERGY 03/24/2007  . Allergy   . ANXIETY 03/28/2007  . Arthritis    knees, back,shoulder  . CAROTID ARTERY STENOSIS, BILATERAL 07/01/2009   yearly checks - no current problems  . CEPHALGIA 06/08/2009  . CHEST PAIN 09/16/2007   no current problems  . COLONIC POLYPS, HX OF 09/16/2007  . COPD (chronic obstructive pulmonary disease) ([place]) 05/12/2017   mild per patient - no inhaler  . Dizziness and giddiness 06/08/2009  . 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 DYSPNEA/SHORTNESS OF BREATH 09/16/2007  . ERECTILE DYSFUNCTION 03/28/2007  . ESOPHAGEAL STRICTURE 09/16/2007  . ESOPHAGITIS 03/24/2007  . GERD 03/24/2007  . GLUCOSE INTOLERANCE 09/16/2007  . HEMORRHOIDS, INTERNAL 03/24/2007  . HYPERCHOLESTEROLEMIA 03/24/2007  . HYPERLIPIDEMIA 09/16/2007  . HYPERTENSION 03/28/2007  . 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 Impaired glucose tolerance 10/04/2010  . LOW BACK PAIN 03/28/2007  . MAGNETIC RESONANCE IMAGING, BRAIN, ABNORMAL 06/26/2009  . NECK PAIN, LEFT 06/20/2010  . OBESITY 03/24/2007  . OTITIS MEDIA, LEFT 06/10/2009  . Palpitations 06/20/2010    no current problems  . PVD 06/25/2009  . RASH-NONVESICULAR 06/17/2009  . URI 08/16/2009    Past Surgical History:  Procedure Laterality Date  . COLONOSCOPY  03/2013   polyps/[place]  . s/p right knee arthroscopy    . UPPER GASTROINTESTINAL ENDOSCOPY     gerd    There were no vitals filed for this visit.   Subjective Assessment - 11/13/20 0933    Subjective Pt. indicated feeling pain was similar today.  Pt. stated Monday was "bad day" needing ice and heat.    Pertinent History anxiety, GERD, hypercholesterolemia, HTN, PVD    Limitations House hold activities;Walking;Standing;Lifting;Sitting    Diagnostic tests xrays showed arthropathy    Patient Stated Goals Reduce pain    Currently in Pain? Yes    Pain Score 7     Pain Location Neck    Pain Orientation Right    Pain Descriptors / Indicators Tightness;Sore;Sharp    Pain Type Chronic pain    Pain Onset More than a month ago    Pain Frequency Intermittent    Aggravating Factors  head movement    Pain Relieving Factors heat/ice at times    Multiple Pain Sites Yes    Pain Location Back    Pain Orientation Lower    Pain Descriptors / Indicators Tightness    Pain Type Chronic pain    Pain Onset More than a month ago    Pain  Frequency Intermittent    Aggravating Factors  stiffness in morning    Pain Relieving Factors movement, muscle relaxers              [facility] PT Assessment - 11/13/20 0001      Assessment   Medical Diagnosis Neck/back pain    Referring Provider (PT) Dwana Melena PA-C    Onset Date/Surgical Date 08/06/20    Hand Dominance Right      AROM   Cervical Flexion 50    Cervical Extension 55    Cervical - Right Rotation 73    Cervical - Left Rotation 70      Special Tests   Other special tests Mild relief c cervical distraction in supine manually                         [facility] Adult PT Treatment/Exercise - 11/13/20 0001      Neck Exercises: Supine   Neck Retraction 5 secs;10 reps     Cervical Rotation 10 reps;Both    Other Supine Exercise supine horizontal abduction green band 3x 10      Lumbar Exercises: Stretches   Lower Trunk Rotation 5 reps   15 seconds x 5 bilateral     Manual Therapy   Manual therapy comments supine g2-g3 cervical downslope mobs Lt and Rt C3-C6.  Supine cervical distraction intermittent      Neck Exercises: Stretches   Levator Stretch 3 reps;Right   15 sec x 3                 PT Education - 11/13/20 1001    Education Details HEP adjustments, more discussion on joint tightness, muscle tightness presentation and relationship to symptoms.    Person(s) Educated Patient    Methods Explanation;Demonstration;Handout;Verbal cues    Comprehension Verbalized understanding;Returned demonstration            PT Short Term Goals - 11/08/20 0952      PT SHORT TERM GOAL #1   Title Patient will demonstrate independent use of home exercise program to maintain progress from in clinic treatments.    Time 3    Period Weeks    Status Achieved    Target Date 10/31/20             PT Long Term Goals - 11/13/20 1002      PT LONG TERM GOAL #1   Title Patient will demonstrate/report pain at worst less than or equal to 2/10 to facilitate minimal limitation in daily activity secondary to pain symptoms.    Time 10    Period Weeks    Status On-going    Target Date 12/19/20      PT LONG TERM GOAL #2   Title Patient will demonstrate independent use of home exercise program to facilitate ability to maintain/progress functional gains from skilled physical therapy services.    Time 10    Period Weeks    Status On-going    Target Date 12/19/20      PT LONG TERM GOAL #3   Title Pt. will demonstrate cervical AROM WFL s symptoms to facilitate head turning, driving, looking around at PLOF s limitation.    Time 10    Period Weeks    Status On-going    Target Date 12/19/20      PT LONG TERM GOAL #4   Title Pt. will demonstrate lumbar ext 100 % WFL  s symptoms to facilitate upright standing, walking  posture at PLOF.    Time 10    Period Weeks    Status On-going    Target Date 12/19/20      PT LONG TERM GOAL #5   Title Pt. will demonstrate/report ability to perform usual yard work, house activity at [employer] s restriction including sitting/standing.    Time 10    Period Weeks    Status On-going    Target Date 12/19/20      PT LONG TERM GOAL #6   Title Pt. will demonstrate FOTO improvement > or = 71 to indicated reduced disability due to condition.    Time 10    Period Weeks    Status On-going    Target Date 12/19/20                 Plan - 11/13/20 0953    Clinical Impression Statement Due to Pt. reports, dry needling performance held due to lack of impact on overall symptoms.  Pt. seemed to benefit in cervical mobility and symptoms  from combination of cervical downslope mobilizations c cervical distraction manually.  Pt. overall presents c multiple areas c impacts from arthritic changes over time that cause joint base, mobility restriction/stiffness, particularily c periods of static position    Personal Factors and Comorbidities Comorbidity 3+    Comorbidities anxiety, GERD, hypercholesterolemia, HTN, PVD    Examination-Activity Limitations Transfers;Stand;Sleep;Sit;Bend    Examination-Participation Restrictions Community Activity;Yard Work;Cleaning;Laundry;Meal Prep    Stability/Clinical Decision Making Evolving/Moderate complexity    Rehab Potential --   fair to good   PT Frequency --   2x/week   PT Duration --   10 wks   PT Treatment/Interventions ADLs/Self Care Home Management;Cryotherapy;Electrical Stimulation;Iontophoresis 4mg /ml Dexamethasone;Moist Heat;Traction;Balance training;Therapeutic exercise;Therapeutic activities;Functional mobility training;Stair training;Gait training;DME Instruction;Ultrasound;Neuromuscular re-education;Patient/family education;Passive range of motion;Spinal Manipulations;Joint  Manipulations;Dry needling;Taping;Manual techniques    PT Next Visit Plan Cervical distraction, traction possible?  Continue attempts to promote improved joint mobility in cervical and lumbar regions to improve functional mobility.    PT Home Exercise Plan [email or web address]    Consulted and Agree with Plan of Care Patient           Patient will benefit from skilled therapeutic intervention in order to improve the following deficits and impairments:  Hypomobility,Decreased activity tolerance,Pain,Increased fascial restricitons,Decreased mobility,Increased muscle spasms,Impaired perceived functional ability,Improper body mechanics,Impaired flexibility,Postural dysfunction,Decreased coordination,Decreased range of motion  Visit Diagnosis: Cervicalgia  Chronic bilateral low back pain without sciatica  Abnormal posture  Difficulty in walking, not elsewhere classified     Problem List Patient Active Problem List   Diagnosis Date Noted  . Iliotibial band syndrome affecting left lower leg 09/08/2020  . Neck pain on right side 09/05/2020  . OAB (overactive bladder) 07/05/2020  . Pain and swelling of right lower leg 07/05/2020  . Cervicalgia 07/05/2020  . Right knee pain 07/05/2020  . PVC's (premature ventricular contractions) 03/02/2020  . BPH (benign prostatic hyperplasia) 03/02/2020  . Arthritis 03/02/2020  . Diastolic dysfunction 50/35/4656  . Urinary frequency 09/14/2019  . Hoarseness of voice 12/07/2018  . Urinary leakage 12/07/2018  . Left knee pain 12/07/2018  . Chronic sinusitis 10/05/2018  . 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 COPD (chronic obstructive pulmonary disease) ([place]) 05/12/2017  . Pleurisy 03/16/2017  . Bilateral leg pain 11/22/2016  . Insomnia 10/13/2016  . Cough 07/22/2016  . Wheezing 07/22/2016  . Peripheral edema 02/21/2016  . Venous insufficiency 02/05/2016  . Low back pain radiating to lower extremity 02/05/2016  . Vertigo   . Hypersomnolence  10/24/2015  .  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 Chest pain 01/18/2013  . Hematochezia 10/12/2012  . Viral illness 05/26/2012  . Palpitations 01/05/2012  . 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 Fatigue 04/09/2011  . Rash 12/22/2010  . Encounter for long-term (current) use of high-risk medication 10/08/2010  . Impaired glucose tolerance 10/04/2010  . Encounter for well adult exam with abnormal findings 10/04/2010  . Carotid artery stenosis 07/01/2009  . PVD 06/25/2009  . Allergic rhinitis 09/16/2007  . ESOPHAGEAL STRICTURE 09/16/2007  . COLONIC POLYPS, HX OF 09/16/2007  . Anxiety state 03/28/2007  . ERECTILE DYSFUNCTION 03/28/2007  . Hyperlipidemia 03/28/2007  . Essential hypertension 03/28/2007  . LOW BACK PAIN 03/28/2007  . OBESITY 03/24/2007  . HEMORRHOIDS, INTERNAL 03/24/2007  . ESOPHAGITIS 03/24/2007  . GERD 03/24/2007   Scot Jun, PT, DPT, OCS, ATC 11/13/20  10:07 AM    [facility] Physical Therapy [address] [place], [place], [address] Phone: [phone]   Fax:  [phone]  Name: Warren Weeks MRN: [id number] Date of Birth: [birth year]

## 2020-11-15 ENCOUNTER — Other Ambulatory Visit: Payer: Self-pay

## 2020-11-15 ENCOUNTER — Encounter: Payer: Self-pay | Admitting: Physical Therapy

## 2020-11-15 ENCOUNTER — Ambulatory Visit: Payer: Medicare PPO | Admitting: Physical Therapy

## 2020-11-15 DIAGNOSIS — G8929 Other chronic pain: Secondary | ICD-10-CM

## 2020-11-15 DIAGNOSIS — M545 Low back pain, unspecified: Secondary | ICD-10-CM

## 2020-11-15 DIAGNOSIS — R262 Difficulty in walking, not elsewhere classified: Secondary | ICD-10-CM

## 2020-11-15 DIAGNOSIS — R293 Abnormal posture: Secondary | ICD-10-CM

## 2020-11-15 DIAGNOSIS — M542 Cervicalgia: Secondary | ICD-10-CM | POA: Diagnosis not present

## 2020-11-15 NOTE — Therapy (Signed)
[facility] Physical Therapy [address] [place], [place], [address] Phone: [phone]   Fax:  [phone]  Physical Therapy Treatment  Patient Details  Name: Warren Weeks MRN: [id number] Date of Birth: 1945/07/24 Referring Provider (PT): Dwana Melena PA-C   Encounter Date: 11/15/2020   PT End of Session - 11/15/20 0957    Visit Number 6    Number of Visits 12    Date for PT Re-Evaluation 12/19/20    Authorization Type [employer] 12 visits until 12/13/2020    Authorization - Visit Number 6    Authorization - Number of Visits 12    Progress Note Due on Visit 10    PT Start Time 0931    PT Stop Time 1007    PT Time Calculation (min) 36 min    Activity Tolerance Patient tolerated treatment well    Behavior During Therapy [facility] for tasks assessed/performed           Past Medical History:  Diagnosis Date  . ALLERGIC RHINITIS 09/16/2007  . ALLERGY 03/24/2007  . Allergy   . ANXIETY 03/28/2007  . Arthritis    knees, back,shoulder  . CAROTID ARTERY STENOSIS, BILATERAL 07/01/2009   yearly checks - no current problems  . CEPHALGIA 06/08/2009  . CHEST PAIN 09/16/2007   no current problems  . COLONIC POLYPS, HX OF 09/16/2007  . COPD (chronic obstructive pulmonary disease) ([place]) 05/12/2017   mild per patient - no inhaler  . Dizziness and giddiness 06/08/2009  . 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 DYSPNEA/SHORTNESS OF BREATH 09/16/2007  . ERECTILE DYSFUNCTION 03/28/2007  . ESOPHAGEAL STRICTURE 09/16/2007  . ESOPHAGITIS 03/24/2007  . GERD 03/24/2007  . GLUCOSE INTOLERANCE 09/16/2007  . HEMORRHOIDS, INTERNAL 03/24/2007  . HYPERCHOLESTEROLEMIA 03/24/2007  . HYPERLIPIDEMIA 09/16/2007  . HYPERTENSION 03/28/2007  . 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 Impaired glucose tolerance 10/04/2010  . LOW BACK PAIN 03/28/2007  . MAGNETIC RESONANCE IMAGING, BRAIN, ABNORMAL 06/26/2009  . NECK PAIN, LEFT 06/20/2010  . OBESITY 03/24/2007  . OTITIS MEDIA, LEFT 06/10/2009  . Palpitations 06/20/2010    no current problems  . PVD 06/25/2009  . RASH-NONVESICULAR 06/17/2009  . URI 08/16/2009    Past Surgical History:  Procedure Laterality Date  . COLONOSCOPY  03/2013   polyps/[place]  . s/p right knee arthroscopy    . UPPER GASTROINTESTINAL ENDOSCOPY     gerd    There were no vitals filed for this visit.   Subjective Assessment - 11/15/20 0933    Subjective has some short term relief but pain is back quickly after sessions    Pertinent History anxiety, GERD, hypercholesterolemia, HTN, PVD    Limitations House hold activities;Walking;Standing;Lifting;Sitting    Diagnostic tests xrays showed arthropathy    Patient Stated Goals Reduce pain    Currently in Pain? Yes    Pain Score 7     Pain Location Neck    Pain Orientation Right    Pain Descriptors / Indicators Tightness;Sore;Sharp    Pain Type Chronic pain    Pain Onset More than a month ago    Pain Frequency Intermittent    Aggravating Factors  head movement    Pain Relieving Factors heat/ice at times    Pain Onset More than a month ago                             [facility] Adult PT Treatment/Exercise - 11/15/20 0935      Neck Exercises: Machines for  Strengthening   UBE (Upper Arm Bike) L2 x 6' (3' each direction)      Neck Exercises: Theraband   Rows Blue   3x10   Shoulder External Rotation Red   3x10   Horizontal ABduction Green   3x10   Horizontal ABduction Limitations seated      Modalities   Modalities Traction      Traction   Type of Traction Cervical    Min (lbs) 13    Max (lbs) 18    Hold Time 60    Rest Time 20    Time 10                    PT Short Term Goals - 11/08/20 1497      PT SHORT TERM GOAL #1   Title Patient will demonstrate independent use of home exercise program to maintain progress from in clinic treatments.    Time 3    Period Weeks    Status Achieved    Target Date 10/31/20             PT Long Term Goals - 11/13/20 1002      PT LONG TERM GOAL #1    Title Patient will demonstrate/report pain at worst less than or equal to 2/10 to facilitate minimal limitation in daily activity secondary to pain symptoms.    Time 10    Period Weeks    Status On-going    Target Date 12/19/20      PT LONG TERM GOAL #2   Title Patient will demonstrate independent use of home exercise program to facilitate ability to maintain/progress functional gains from skilled physical therapy services.    Time 10    Period Weeks    Status On-going    Target Date 12/19/20      PT LONG TERM GOAL #3   Title Pt. will demonstrate cervical AROM WFL s symptoms to facilitate head turning, driving, looking around at PLOF s limitation.    Time 10    Period Weeks    Status On-going    Target Date 12/19/20      PT LONG TERM GOAL #4   Title Pt. will demonstrate lumbar ext 100 % WFL s symptoms to facilitate upright standing, walking posture at PLOF.    Time 10    Period Weeks    Status On-going    Target Date 12/19/20      PT LONG TERM GOAL #5   Title Pt. will demonstrate/report ability to perform usual yard work, house activity at [employer] s restriction including sitting/standing.    Time 10    Period Weeks    Status On-going    Target Date 12/19/20      PT LONG TERM GOAL #6   Title Pt. will demonstrate FOTO improvement > or = 71 to indicated reduced disability due to condition.    Time 10    Period Weeks    Status On-going    Target Date 12/19/20                 Plan - 11/15/20 0957    Clinical Impression Statement Trial of traction today as pt feels other treatment interventions at this time have provided short term relief only, so will see if this is different.  May need to follow back up with MD if no significant change continues to be reported.    Personal Factors and Comorbidities Comorbidity 3+    Comorbidities anxiety, GERD,  hypercholesterolemia, HTN, PVD    Examination-Activity Limitations Transfers;Stand;Sleep;Sit;Bend     Examination-Participation Restrictions Community Activity;Yard Work;Cleaning;Laundry;Meal Prep    Stability/Clinical Decision Making Evolving/Moderate complexity    Rehab Potential --   fair to good   PT Frequency --   2x/week   PT Duration --   10 wks   PT Treatment/Interventions ADLs/Self Care Home Management;Cryotherapy;Electrical Stimulation;Iontophoresis 4mg /ml Dexamethasone;Moist Heat;Traction;Balance training;Therapeutic exercise;Therapeutic activities;Functional mobility training;Stair training;Gait training;DME Instruction;Ultrasound;Neuromuscular re-education;Patient/family education;Passive range of motion;Spinal Manipulations;Joint Manipulations;Dry needling;Taping;Manual techniques    PT Next Visit Plan Cervical distraction, assess response to traction; Continue attempts to promote improved joint mobility in cervical and lumbar regions to improve functional mobility.    PT Home Exercise Plan [email or web address]    Consulted and Agree with Plan of Care Patient           Patient will benefit from skilled therapeutic intervention in order to improve the following deficits and impairments:  Hypomobility,Decreased activity tolerance,Pain,Increased fascial restricitons,Decreased mobility,Increased muscle spasms,Impaired perceived functional ability,Improper body mechanics,Impaired flexibility,Postural dysfunction,Decreased coordination,Decreased range of motion  Visit Diagnosis: Cervicalgia  Chronic bilateral low back pain without sciatica  Abnormal posture  Difficulty in walking, not elsewhere classified     Problem List Patient Active Problem List   Diagnosis Date Noted  . Iliotibial band syndrome affecting left lower leg 09/08/2020  . Neck pain on right side 09/05/2020  . OAB (overactive bladder) 07/05/2020  . Pain and swelling of right lower leg 07/05/2020  . Cervicalgia 07/05/2020  . Right knee pain 07/05/2020  . PVC's (premature ventricular contractions) 03/02/2020  . BPH  (benign prostatic hyperplasia) 03/02/2020  . Arthritis 03/02/2020  . Diastolic dysfunction 96/22/2979  . Urinary frequency 09/14/2019  . Hoarseness of voice 12/07/2018  . Urinary leakage 12/07/2018  . Left knee pain 12/07/2018  . Chronic sinusitis 10/05/2018  . 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 COPD (chronic obstructive pulmonary disease) ([place]) 05/12/2017  . Pleurisy 03/16/2017  . Bilateral leg pain 11/22/2016  . Insomnia 10/13/2016  . Cough 07/22/2016  . Wheezing 07/22/2016  . Peripheral edema 02/21/2016  . Venous insufficiency 02/05/2016  . Low back pain radiating to lower extremity 02/05/2016  . Vertigo   . Hypersomnolence 10/24/2015  . 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 Chest pain 01/18/2013  . Hematochezia 10/12/2012  . Viral illness 05/26/2012  . Palpitations 01/05/2012  . 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 Fatigue 04/09/2011  . Rash 12/22/2010  . Encounter for long-term (current) use of high-risk medication 10/08/2010  . Impaired glucose tolerance 10/04/2010  . Encounter for well adult exam with abnormal findings 10/04/2010  . Carotid artery stenosis 07/01/2009  . PVD 06/25/2009  . Allergic rhinitis 09/16/2007  . ESOPHAGEAL STRICTURE 09/16/2007  . COLONIC POLYPS, HX OF 09/16/2007  . Anxiety state 03/28/2007  . ERECTILE DYSFUNCTION 03/28/2007  . Hyperlipidemia 03/28/2007  . Essential hypertension 03/28/2007  . LOW BACK PAIN 03/28/2007  . OBESITY 03/24/2007  . HEMORRHOIDS, INTERNAL 03/24/2007  . ESOPHAGITIS 03/24/2007  . GERD 03/24/2007      Laureen Abrahams, PT, DPT 11/15/20 9:59 AM    [facility] Physical Therapy [address] [place], [place], [address] Phone: [phone]   Fax:  [phone]  Name: MICHARL HELMES MRN: [id number] Date of Birth: [birth year]

## 2020-11-18 ENCOUNTER — Other Ambulatory Visit: Payer: Self-pay | Admitting: Internal Medicine

## 2020-11-18 NOTE — Telephone Encounter (Signed)
Please refill as per office routine med refill policy (all routine meds refilled for 3 mo or monthly per pt preference up to one year from last visit, then month to month grace period for 3 mo, then further med refills will have to be denied)  

## 2020-11-20 ENCOUNTER — Encounter: Payer: Self-pay | Admitting: Rehabilitative and Restorative Service Providers"

## 2020-11-20 ENCOUNTER — Other Ambulatory Visit: Payer: Self-pay

## 2020-11-20 ENCOUNTER — Ambulatory Visit: Payer: Medicare PPO | Admitting: Rehabilitative and Restorative Service Providers"

## 2020-11-20 DIAGNOSIS — R262 Difficulty in walking, not elsewhere classified: Secondary | ICD-10-CM | POA: Diagnosis not present

## 2020-11-20 DIAGNOSIS — M545 Low back pain, unspecified: Secondary | ICD-10-CM

## 2020-11-20 DIAGNOSIS — G8929 Other chronic pain: Secondary | ICD-10-CM

## 2020-11-20 DIAGNOSIS — R293 Abnormal posture: Secondary | ICD-10-CM | POA: Diagnosis not present

## 2020-11-20 DIAGNOSIS — M542 Cervicalgia: Secondary | ICD-10-CM | POA: Diagnosis not present

## 2020-11-20 NOTE — Therapy (Addendum)
[facility] Physical Therapy [address] [place], [place], [address] Phone: [phone]   Fax:  [phone]  Physical Therapy Treatment/Progress Note/ Discharge   Patient Details  Name: Warren Weeks MRN: [id number] Date of Birth: [birth year] Referring Provider (PT): Dwana Melena PA-C   Encounter Date: 11/20/2020   Progress Note Reporting Period 10/10/2020 to 11/20/2020  See note below for Objective Data and Assessment of Progress/Goals.        PT End of Session - 11/20/20 0932     Visit Number 7    Number of Visits 12    Date for PT Re-Evaluation 12/19/20    Authorization Type [employer] 12 visits until 12/13/2020    Authorization - Visit Number 7    Authorization - Number of Visits 12    Progress Note Due on Visit 12    PT Start Time 0160    PT Stop Time 1008    PT Time Calculation (min) 37 min    Activity Tolerance Patient tolerated treatment well    Behavior During Therapy [facility] for tasks assessed/performed             Past Medical History:  Diagnosis Date   ALLERGIC RHINITIS 09/16/2007   ALLERGY 03/24/2007   Allergy    ANXIETY 03/28/2007   Arthritis    knees, back,shoulder   CAROTID ARTERY STENOSIS, BILATERAL 07/01/2009   yearly checks - no current problems   CEPHALGIA 06/08/2009   CHEST PAIN 09/16/2007   no current problems   COLONIC POLYPS, HX OF 09/16/2007   COPD (chronic obstructive pulmonary disease) ([place]) 05/12/2017   mild per patient - no inhaler   Dizziness and giddiness 06/08/2009   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   DYSPNEA/SHORTNESS OF BREATH 09/16/2007   ERECTILE DYSFUNCTION 03/28/2007   ESOPHAGEAL STRICTURE 09/16/2007   ESOPHAGITIS 03/24/2007   GERD 03/24/2007   GLUCOSE INTOLERANCE 09/16/2007   HEMORRHOIDS, INTERNAL 03/24/2007   HYPERCHOLESTEROLEMIA 03/24/2007   HYPERLIPIDEMIA 09/16/2007   HYPERTENSION 03/28/2007   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/2011   Impaired glucose tolerance 10/04/2010   LOW BACK PAIN 03/28/2007   MAGNETIC RESONANCE  IMAGING, BRAIN, ABNORMAL 06/26/2009   NECK PAIN, LEFT 06/20/2010   OBESITY 03/24/2007   OTITIS MEDIA, LEFT 06/10/2009   Palpitations 06/20/2010   no current problems   PVD 06/25/2009   RASH-NONVESICULAR 06/17/2009   URI 08/16/2009    Past Surgical History:  Procedure Laterality Date   COLONOSCOPY  03/2013   polyps/[place]   s/p right knee arthroscopy     UPPER GASTROINTESTINAL ENDOSCOPY     gerd    There were no vitals filed for this visit.   Subjective Assessment - 11/20/20 0938     Subjective Pt. indicated global rating of change for condition at +2 .  Pt. indicated symptoms are intermittent where previously were more constant but symptoms can still rise to severe levels and notice trouble c head movements.    Pertinent History anxiety, GERD, hypercholesterolemia, HTN, PVD    Limitations House hold activities;Walking;Standing;Lifting;Sitting    Diagnostic tests xrays showed arthropathy    Patient Stated Goals Reduce pain    Currently in Pain? Yes    Pain Location Neck    Pain Orientation Right    Pain Descriptors / Indicators Tightness;Sore;Sharp    Pain Type Chronic pain    Pain Onset More than a month ago    Pain Frequency Intermittent    Aggravating Factors  head movements    Pain Relieving Factors heat/ice at times    Pain Score  0    Pain Onset More than a month ago                [facility] PT Assessment - 11/20/20 0001       Assessment   Medical Diagnosis Neck/back pain    Referring Provider (PT) Dwana Melena PA-C    Onset Date/Surgical Date 08/06/20    Hand Dominance Right      Observation/Other Assessments   Focus on Therapeutic Outcomes (FOTO)  status update 64%      AROM   Overall AROM Comments Complaints in cervical region c rotation, primarly on return to neutral after rotation movement    Cervical Flexion 60    Cervical Extension 55    Cervical - Right Rotation 70    Cervical - Left Rotation 68                           [facility]  Adult PT Treatment/Exercise - 11/20/20 0001       Exercises   Other Exercises  HEP review and cues for continued use for maintaining and gaining more mobility as tolerated while PT is on hold for return to MD for follow up.      Traction   Min (lbs) 12    Max (lbs) 18    Hold Time 50    Rest Time 10    Time 15                    PT Education - 11/20/20 1000     Education Details POC update (hold of PT, follow up with MD)    Person(s) Educated Patient    Methods Explanation    Comprehension Verbalized understanding              PT Short Term Goals - 11/08/20 9794       PT SHORT TERM GOAL #1   Title Patient will demonstrate independent use of home exercise program to maintain progress from in clinic treatments.    Time 3    Period Weeks    Status Achieved    Target Date 10/31/20               PT Long Term Goals - 11/20/20 0958       PT LONG TERM GOAL #1   Title Patient will demonstrate/report pain at worst less than or equal to 2/10 to facilitate minimal limitation in daily activity secondary to pain symptoms.    Time 10    Period Weeks    Status Not Met      PT LONG TERM GOAL #2   Title Patient will demonstrate independent use of home exercise program to facilitate ability to maintain/progress functional gains from skilled physical therapy services.    Time 10    Period Weeks    Status Achieved      PT LONG TERM GOAL #3   Title Pt. will demonstrate cervical AROM WFL s symptoms to facilitate head turning, driving, looking around at PLOF s limitation.    Time 10    Period Weeks    Status Partially Met      PT LONG TERM GOAL #4   Title Pt. will demonstrate lumbar ext 100 % WFL s symptoms to facilitate upright standing, walking posture at PLOF.    Time 10    Period Weeks    Status On-going      PT LONG TERM GOAL #5  Title Pt. will demonstrate/report ability to perform usual yard work, house activity at [employer] s restriction including  sitting/standing.    Time 10    Period Weeks    Status Partially Met      PT LONG TERM GOAL #6   Title Pt. will demonstrate FOTO improvement > or = 71 to indicated reduced disability due to condition.    Time 10    Period Weeks    Status Not Met                   Plan - 11/20/20 0955     Clinical Impression Statement Pt. has attended 7 visits overall during course of treatment, reporting global rating of change +2 at this time.  Overall, continued cervical complaints moderate to severe at times, primarily c rotation movements but also at rest as well.  See objective data for updated information.  Pt. has demonstrated mild improvements in overall mobility to this point but continued to report limitations from symptoms.  FOTO update clinically unchanged in functional reporting to this point as documented.  At this time, it is appropriate for Pt. to see out follow up from MD office regarding lack of overall functional/symptom change to this point with hold of skilled PT services until follow up is performed.  Recommendation for continued use of HEP during this time given.  Plan any continued treatment based off MD follow up recommendations.    Personal Factors and Comorbidities Comorbidity 3+    Comorbidities anxiety, GERD, hypercholesterolemia, HTN, PVD    Examination-Activity Limitations Transfers;Stand;Sleep;Sit;Bend    Examination-Participation Restrictions Community Activity;Yard Work;Cleaning;Laundry;Meal Prep    Stability/Clinical Decision Making Evolving/Moderate complexity    Rehab Potential --   fair to good   PT Frequency --   2x/week   PT Duration --   10 wks   PT Treatment/Interventions ADLs/Self Care Home Management;Cryotherapy;Electrical Stimulation;Iontophoresis 31m/ml Dexamethasone;Moist Heat;Traction;Balance training;Therapeutic exercise;Therapeutic activities;Functional mobility training;Stair training;Gait training;DME Instruction;Ultrasound;Neuromuscular  re-education;Patient/family education;Passive range of motion;Spinal Manipulations;Joint Manipulations;Dry needling;Taping;Manual techniques    PT Next Visit Plan Follow up with MD, PT on hold until reassessment    PT Home Exercise Plan [id number]    Consulted and Agree with Plan of Care Patient             Patient will benefit from skilled therapeutic intervention in order to improve the following deficits and impairments:  Hypomobility,Decreased activity tolerance,Pain,Increased fascial restricitons,Decreased mobility,Increased muscle spasms,Impaired perceived functional ability,Improper body mechanics,Impaired flexibility,Postural dysfunction,Decreased coordination,Decreased range of motion  Visit Diagnosis: Cervicalgia  Chronic bilateral low back pain without sciatica  Abnormal posture  Difficulty in walking, not elsewhere classified     Problem List Patient Active Problem List   Diagnosis Date Noted   Iliotibial band syndrome affecting left lower leg 09/08/2020   Neck pain on right side 09/05/2020   OAB (overactive bladder) 07/05/2020   Pain and swelling of right lower leg 07/05/2020   Cervicalgia 07/05/2020   Right knee pain 07/05/2020   PVC's (premature ventricular contractions) 03/02/2020   BPH (benign prostatic hyperplasia) 03/02/2020   Arthritis 083/41/9622  Diastolic dysfunction 029/79/8921  Urinary frequency 09/14/2019   Hoarseness of voice 12/07/2018   Urinary leakage 12/07/2018   Left knee pain 12/07/2018   Chronic sinusitis 10/05/2018   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   COPD (chronic obstructive pulmonary disease) ([place] 05/12/2017   Pleurisy 03/16/2017   Bilateral leg pain 11/22/2016   Insomnia 10/13/2016   Cough 07/22/2016   Wheezing 07/22/2016   Peripheral edema 02/21/2016  Venous insufficiency 02/05/2016   Low back pain radiating to lower extremity 02/05/2016   Vertigo    Hypersomnolence 10/24/2015   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[place]) 12/14/2014    Chest pain 01/18/2013   Hematochezia 10/12/2012   Viral illness 05/26/2012   Palpitations 01/05/2012   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/2011   Fatigue 04/09/2011   Rash 12/22/2010   Encounter for long-term (current) use of high-risk medication 10/08/2010   Impaired glucose tolerance 10/04/2010   Encounter for well adult exam with abnormal findings 10/04/2010   Carotid artery stenosis 07/01/2009   PVD 06/25/2009   Allergic rhinitis 09/16/2007   ESOPHAGEAL STRICTURE 09/16/2007   COLONIC POLYPS, HX OF 09/16/2007   Anxiety state 03/28/2007   ERECTILE DYSFUNCTION 03/28/2007   Hyperlipidemia 03/28/2007   Essential hypertension 03/28/2007   LOW BACK PAIN 03/28/2007   OBESITY 03/24/2007   HEMORRHOIDS, INTERNAL 03/24/2007   ESOPHAGITIS 03/24/2007   GERD 03/24/2007    Scot Jun, PT, DPT, OCS, ATC 11/20/20  10:01 AM  PHYSICAL THERAPY DISCHARGE SUMMARY  Visits from Start of Care: 7  Current functional level related to goals / functional outcomes: See note   Remaining deficits: See note   Education / Equipment: HEP   Patient agrees to discharge. Patient goals were partially met. Patient is being discharged due to lack of progress.  Scot Jun, PT, DPT, OCS, ATC 12/17/20  9:52 AM     [facility] Physical Therapy [address] [place], [place], [address] Phone: [phone]   Fax:  [phone]  Name: Warren Weeks MRN: [id number] Date of Birth: 1946/04/04

## 2020-11-22 ENCOUNTER — Encounter: Payer: Medicare PPO | Admitting: Rehabilitative and Restorative Service Providers"

## 2020-11-22 DIAGNOSIS — J342 Deviated nasal septum: Secondary | ICD-10-CM | POA: Diagnosis not present

## 2020-11-22 DIAGNOSIS — J310 Chronic rhinitis: Secondary | ICD-10-CM | POA: Diagnosis not present

## 2020-11-22 DIAGNOSIS — J343 Hypertrophy of nasal turbinates: Secondary | ICD-10-CM | POA: Diagnosis not present

## 2020-11-25 ENCOUNTER — Other Ambulatory Visit: Payer: Self-pay | Admitting: Otolaryngology

## 2020-11-27 ENCOUNTER — Encounter: Payer: Medicare PPO | Admitting: Rehabilitative and Restorative Service Providers"

## 2020-11-29 ENCOUNTER — Encounter: Payer: Medicare PPO | Admitting: Rehabilitative and Restorative Service Providers"

## 2020-12-03 ENCOUNTER — Other Ambulatory Visit: Payer: Self-pay | Admitting: Internal Medicine

## 2020-12-04 ENCOUNTER — Encounter: Payer: Medicare PPO | Admitting: Rehabilitative and Restorative Service Providers"

## 2020-12-06 ENCOUNTER — Encounter: Payer: Medicare PPO | Admitting: Rehabilitative and Restorative Service Providers"

## 2020-12-10 ENCOUNTER — Encounter: Payer: Self-pay | Admitting: Orthopaedic Surgery

## 2020-12-10 ENCOUNTER — Other Ambulatory Visit: Payer: Self-pay | Admitting: Internal Medicine

## 2020-12-10 ENCOUNTER — Ambulatory Visit: Payer: Medicare PPO | Admitting: Orthopaedic Surgery

## 2020-12-10 VITALS — Ht 69.0 in | Wt 250.0 lb

## 2020-12-10 DIAGNOSIS — M542 Cervicalgia: Secondary | ICD-10-CM

## 2020-12-10 DIAGNOSIS — G8929 Other chronic pain: Secondary | ICD-10-CM

## 2020-12-10 MED ORDER — DICLOFENAC SODIUM 75 MG PO TBEC: 75.0000 mg | DELAYED_RELEASE_TABLET | Freq: Two times a day (BID) | ORAL | 2 refills | Status: DC | PRN

## 2020-12-10 MED ORDER — PREDNISONE 10 MG (21) PO TBPK
ORAL_TABLET | ORAL | 0 refills | Status: DC
Start: 2020-12-10 — End: 2021-08-11

## 2020-12-10 MED ORDER — METHOCARBAMOL 500 MG PO TABS: 500.0000 mg | ORAL_TABLET | Freq: Two times a day (BID) | ORAL | 2 refills | Status: DC | PRN

## 2020-12-10 NOTE — Telephone Encounter (Signed)
Please refill as per office routine med refill policy (all routine meds refilled for 3 mo or monthly per pt preference up to one year from last visit, then month to month grace period for 3 mo, then further med refills will have to be denied)  

## 2020-12-10 NOTE — Progress Notes (Signed)
Office Visit Note   Patient: Warren Weeks           Date of Birth: 26-Aug-1945           MRN: [id number] Visit Date: 12/10/2020              Requested by: Biagio Borg, MD [place],  [place] [id number] PCP: Biagio Borg, MD   Assessment & Plan: Visit Diagnoses:  1. Chronic neck pain     Plan: Impression is chronic neck pain.  At this point, he is tried oral and topical anti-inflammatories in addition to physical therapy to include dry needling all without relief of symptoms.  I believe it is appropriate to order an MRI of the cervical spine to assess for structural abnormalities.  In the meantime, have called in another steroid taper and refilled his muscle relaxers.  He will not continue physical therapy at this point as his symptoms have not improved.  He will follow-up with [place] after his MRI.  Call with concerns or questions in the meantime.  Follow-Up Instructions: Return for after MRI cervical spine.   Orders:  No orders of the defined types were placed in this encounter.  Meds ordered this encounter  Medications   predniSONE (STERAPRED UNI-PAK 21 TAB) 10 MG (21) TBPK tablet    Sig: Take as directed.  Do not take any other nsaids while on this steroid    Dispense:  21 tablet    Refill:  0   methocarbamol (ROBAXIN) 500 MG tablet    Sig: Take 1 tablet (500 mg total) by mouth 2 (two) times daily as needed.    Dispense:  40 tablet    Refill:  2   diclofenac (VOLTAREN) 75 MG EC tablet    Sig: Take 1 tablet (75 mg total) by mouth 2 (two) times daily as needed.    Dispense:  60 tablet    Refill:  2      Procedures: No procedures performed   Clinical Data: No additional findings.   Subjective: Chief Complaint  Patient presents with   Neck - Follow-up    HPI patient is a pleasant 75 year old gentleman who comes in today with chronic right lateral neck pain.  This has been ongoing for the past 4 to 5 months.  The pain he has is primarily to the  lateral neck and is worse with cervical spine rotation.  He has been using arthritis cream and taking diclofenac and Tylenol without relief.  He denies any weakness or paresthesias to the upper extremities.  He has been in physical therapy for over 2 months without any relief in symptoms.  He denies any history of epidural steroid injection.  Review of Systems as detailed in HPI.  All others reviewed and are negative.   Objective: Vital Signs: Ht 5\' 9"  (1.753 m)   Wt 250 lb (113.4 kg)   BMI 36.92 kg/m   Physical Exam well-developed well-nourished gentleman in no acute distress.  Alert and oriented x3.  Ortho Exam examination of the cervical spine reveals no spinous tenderness.  He does have tenderness to the paraspinous musculature on the right.  He has increased pain with flexion and rotation of the neck.  No focal weakness.  He is neurovascularly intact distally.  Specialty Comments:  No specialty comments available.  Imaging: No new imaging   PMFS History: Patient Active Problem List   Diagnosis Date Noted   Iliotibial band syndrome affecting left  lower leg 09/08/2020   Neck pain on right side 09/05/2020   OAB (overactive bladder) 07/05/2020   Pain and swelling of right lower leg 07/05/2020   Cervicalgia 07/05/2020   Right knee pain 07/05/2020   PVC's (premature ventricular contractions) 03/02/2020   BPH (benign prostatic hyperplasia) 03/02/2020   Arthritis 12/87/8676   Diastolic dysfunction 72/02/4708   Urinary frequency 09/14/2019   Hoarseness of voice 12/07/2018   Urinary leakage 12/07/2018   Left knee pain 12/07/2018   Chronic sinusitis 10/05/2018   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   COPD (chronic obstructive pulmonary disease) ([place]) 05/12/2017   Pleurisy 03/16/2017   Bilateral leg pain 11/22/2016   Insomnia 10/13/2016   Cough 07/22/2016   Wheezing 07/22/2016   Peripheral edema 02/21/2016   Venous insufficiency 02/05/2016   Low back pain radiating to lower  extremity 02/05/2016   Vertigo    Hypersomnolence 10/24/2015   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[place]) 12/14/2014   Chest pain 01/18/2013   Hematochezia 10/12/2012   Viral illness 05/26/2012   Palpitations 01/05/2012   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/2011   Fatigue 04/09/2011   Rash 12/22/2010   Encounter for long-term (current) use of high-risk medication 10/08/2010   Impaired glucose tolerance 10/04/2010   Encounter for well adult exam with abnormal findings 10/04/2010   Carotid artery stenosis 07/01/2009   PVD 06/25/2009   Allergic rhinitis 09/16/2007   ESOPHAGEAL STRICTURE 09/16/2007   COLONIC POLYPS, HX OF 09/16/2007   Anxiety state 03/28/2007   ERECTILE DYSFUNCTION 03/28/2007   Hyperlipidemia 03/28/2007   Essential hypertension 03/28/2007   LOW BACK PAIN 03/28/2007   OBESITY 03/24/2007   HEMORRHOIDS, INTERNAL 03/24/2007   ESOPHAGITIS 03/24/2007   GERD 03/24/2007   Past Medical History:  Diagnosis Date   ALLERGIC RHINITIS 09/16/2007   ALLERGY 03/24/2007   Allergy    ANXIETY 03/28/2007   Arthritis    knees, back,shoulder   CAROTID ARTERY STENOSIS, BILATERAL 07/01/2009   yearly checks - no current problems   CEPHALGIA 06/08/2009   CHEST PAIN 09/16/2007   no current problems   COLONIC POLYPS, HX OF 09/16/2007   COPD (chronic obstructive pulmonary disease) ([place]) 05/12/2017   mild per patient - no inhaler   Dizziness and giddiness 06/08/2009   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   DYSPNEA/SHORTNESS OF BREATH 09/16/2007   ERECTILE DYSFUNCTION 03/28/2007   ESOPHAGEAL STRICTURE 09/16/2007   ESOPHAGITIS 03/24/2007   GERD 03/24/2007   GLUCOSE INTOLERANCE 09/16/2007   HEMORRHOIDS, INTERNAL 03/24/2007   HYPERCHOLESTEROLEMIA 03/24/2007   HYPERLIPIDEMIA 09/16/2007   HYPERTENSION 03/28/2007   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/2011   Impaired glucose tolerance 10/04/2010   LOW BACK PAIN 03/28/2007   MAGNETIC RESONANCE IMAGING, BRAIN, ABNORMAL 06/26/2009   NECK PAIN, LEFT 06/20/2010   OBESITY 03/24/2007   OTITIS MEDIA,  LEFT 06/10/2009   Palpitations 06/20/2010   no current problems   PVD 06/25/2009   RASH-NONVESICULAR 06/17/2009   URI 08/16/2009    Family History  Problem Relation Age of Onset   Lymphoma Mother    Heart disease Father        CHF   Ovarian cancer Sister    Lung cancer Sister    Coronary artery disease Sister        CABG   Coronary artery disease Brother        stent, and CAD   Colon cancer Neg Hx    Esophageal cancer Neg Hx    Rectal cancer Neg Hx    Stomach cancer Neg Hx    Colon polyps Neg Hx  Past Surgical History:  Procedure Laterality Date   COLONOSCOPY  03/2013   polyps/[place]   s/p right knee arthroscopy     UPPER GASTROINTESTINAL ENDOSCOPY     [facility]   Social History   Occupational History   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

## 2020-12-11 ENCOUNTER — Other Ambulatory Visit: Payer: Self-pay

## 2020-12-11 ENCOUNTER — Encounter: Payer: Medicare PPO | Admitting: Rehabilitative and Restorative Service Providers"

## 2020-12-11 DIAGNOSIS — M542 Cervicalgia: Secondary | ICD-10-CM

## 2020-12-13 ENCOUNTER — Encounter: Payer: Medicare PPO | Admitting: Rehabilitative and Restorative Service Providers"

## 2020-12-16 ENCOUNTER — Other Ambulatory Visit: Payer: Self-pay | Admitting: Internal Medicine

## 2020-12-20 ENCOUNTER — Telehealth: Payer: Self-pay | Admitting: Orthopaedic Surgery

## 2020-12-20 NOTE — Telephone Encounter (Signed)
Called patient no answer and no answering machine pickup   Will try back later.

## 2020-12-22 ENCOUNTER — Other Ambulatory Visit: Payer: Self-pay

## 2020-12-22 ENCOUNTER — Ambulatory Visit
Admission: RE | Admit: 2020-12-22 | Discharge: 2020-12-22 | Disposition: A | Discharge status: Home / Self Care | Payer: Medicare PPO | Source: Ambulatory Visit | Attending: Orthopaedic Surgery | Admitting: Orthopaedic Surgery

## 2020-12-22 DIAGNOSIS — M542 Cervicalgia: Secondary | ICD-10-CM

## 2020-12-22 DIAGNOSIS — M4802 Spinal stenosis, cervical region: Secondary | ICD-10-CM | POA: Diagnosis not present

## 2020-12-25 ENCOUNTER — Other Ambulatory Visit: Payer: Medicare PPO

## 2020-12-25 ENCOUNTER — Encounter: Payer: Self-pay | Admitting: Internal Medicine

## 2020-12-25 ENCOUNTER — Telehealth (INDEPENDENT_AMBULATORY_CARE_PROVIDER_SITE_OTHER): Payer: Medicare PPO | Admitting: Internal Medicine

## 2020-12-25 DIAGNOSIS — J449 Chronic obstructive pulmonary disease, unspecified: Secondary | ICD-10-CM

## 2020-12-25 DIAGNOSIS — R7302 Impaired glucose tolerance (oral): Secondary | ICD-10-CM | POA: Diagnosis not present

## 2020-12-25 DIAGNOSIS — J019 Acute sinusitis, unspecified: Secondary | ICD-10-CM | POA: Insufficient documentation

## 2020-12-25 MED ORDER — LEVOFLOXACIN 500 MG PO TABS: 500.0000 mg | ORAL_TABLET | Freq: Every day | ORAL | 0 refills | Status: AC

## 2020-12-25 NOTE — Assessment & Plan Note (Signed)
Lab Results  Component Value Date   HGBA1C 6.3 07/05/2020   Stable, pt to continue current medical treatment  - diet

## 2020-12-25 NOTE — Assessment & Plan Note (Addendum)
Mild to mod, for antibx course,  to f/u any worsening symptoms or concerns; pt encouraged to check COVID home testing today

## 2020-12-25 NOTE — Patient Instructions (Signed)
Please take all new medication as prescribed 

## 2020-12-25 NOTE — Assessment & Plan Note (Signed)
Stable, cont current med tx - inhaler prn

## 2020-12-25 NOTE — Progress Notes (Signed)
Patient ID: Warren Weeks, male   DOB: 1946/01/03, 75 y.o.   MRN: [id number]  Virtual Visit via Video Note  I connected with Warren Weeks on 12/25/20 at  3:40 PM EDT by a video enabled telemedicine application and verified that I am speaking with the correct person using two identifiers.  Location of all participants today Patient: at home Provider: at office   I discussed the limitations of evaluation and management by telemedicine and the availability of in person appointments. The patient expressed understanding and agreed to proceed.  History of Present Illness:  Here with 4 days acute onset fever, facial pain, pressure, headache, general weakness and malaise, and greenish d/c, with mild ST and cough, but pt denies chest pain, wheezing, increased sob or doe, orthopnea, PND, increased LE swelling, palpitations, dizziness or syncope.  Has nasal surgury planned for July 15 with ENT.  Has not yet tested for covid  Pt denies polydipsia, polyuria, or new focal neuro s/s.  Already has mucinex and delsym at home Past Medical History:  Diagnosis Date   ALLERGIC RHINITIS 09/16/2007   ALLERGY 03/24/2007   Allergy    ANXIETY 03/28/2007   Arthritis    knees, back,shoulder   CAROTID ARTERY STENOSIS, BILATERAL 07/01/2009   yearly checks - no current problems   CEPHALGIA 06/08/2009   CHEST PAIN 09/16/2007   no current problems   COLONIC POLYPS, HX OF 09/16/2007   COPD (chronic obstructive pulmonary disease) ([place]) 05/12/2017   mild per patient - no inhaler   Dizziness and giddiness 06/08/2009   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   DYSPNEA/SHORTNESS OF BREATH 09/16/2007   ERECTILE DYSFUNCTION 03/28/2007   ESOPHAGEAL STRICTURE 09/16/2007   ESOPHAGITIS 03/24/2007   GERD 03/24/2007   GLUCOSE INTOLERANCE 09/16/2007   HEMORRHOIDS, INTERNAL 03/24/2007   HYPERCHOLESTEROLEMIA 03/24/2007   HYPERLIPIDEMIA 09/16/2007   HYPERTENSION 03/28/2007   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/2011   Impaired glucose tolerance 10/04/2010   LOW  BACK PAIN 03/28/2007   MAGNETIC RESONANCE IMAGING, BRAIN, ABNORMAL 06/26/2009   NECK PAIN, LEFT 06/20/2010   OBESITY 03/24/2007   OTITIS MEDIA, LEFT 06/10/2009   Palpitations 06/20/2010   no current problems   PVD 06/25/2009   RASH-NONVESICULAR 06/17/2009   URI 08/16/2009   Past Surgical History:  Procedure Laterality Date   COLONOSCOPY  03/2013   polyps/[place]   s/p right knee arthroscopy     UPPER GASTROINTESTINAL ENDOSCOPY     [facility]    reports that he has quit smoking. His smoking use included cigarettes. He has a 7.50 pack-year smoking history. He has never used smokeless tobacco. He reports current alcohol use of about 7.0 standard drinks of alcohol per week. He reports that he does not use drugs. family history includes Coronary artery disease in his brother and sister; Heart disease in his father; Lung cancer in his sister; Lymphoma in his mother; Ovarian cancer in his sister. Allergies  Allergen Reactions   Cefuroxime Axetil Rash   Current Outpatient Medications on File Prior to Visit  Medication Sig Dispense Refill   fluticasone (FLONASE) 50 MCG/ACT nasal spray Use 2 spray(s) in each nostril once daily 16 g 0   acetaminophen (TYLENOL) 650 MG CR tablet Take 650 mg by mouth every 8 (eight) hours as needed for pain.     aspirin 81 MG EC tablet Take 81 mg by mouth daily.     colchicine 0.6 MG tablet Take two pills on day one, and then one pill bid until symptoms resolve, but do not take  longer than 3 days 8 tablet 2   Cyanocobalamin (B-12 PO) Take 1 tablet by mouth daily.     diclofenac (VOLTAREN) 75 MG EC tablet Take 1 tablet (75 mg total) by mouth 2 (two) times daily as needed. 60 tablet 2   ezetimibe (ZETIA) 10 MG tablet Take 1 tablet by mouth once daily 90 tablet 3   hydrochlorothiazide (HYDRODIURIL) 25 MG tablet Take 1 tablet (25 mg total) by mouth daily. 90 tablet 3   lisinopril (ZESTRIL) 20 MG tablet Take 1 tablet by mouth once daily 90 tablet 2   Melatonin 10 MG TABS Take 10  mg by mouth at bedtime.      methocarbamol (ROBAXIN) 500 MG tablet Take 1 tablet (500 mg total) by mouth 2 (two) times daily as needed. 40 tablet 2   metoprolol tartrate (LOPRESSOR) 50 MG tablet Take 1 tablet by mouth twice daily 180 tablet 0   mometasone (NASONEX) 50 MCG/ACT nasal spray Place 2 sprays into the nose daily. 1 each 12   Multiple Vitamin (ONE-A-DAY 55 PLUS PO) Take 1 tablet by mouth daily.     omeprazole (PRILOSEC) 20 MG capsule Take 1 capsule by mouth twice daily 180 capsule 0   predniSONE (STERAPRED UNI-PAK 21 TAB) 10 MG (21) TBPK tablet Take as directed.  Do not take any other nsaids while on this steroid 21 tablet 0   rosuvastatin (CRESTOR) 40 MG tablet Take 1 tablet by mouth once daily 90 tablet 0   sildenafil (VIAGRA) 100 MG tablet Take 1 tablet (100 mg total) by mouth as needed for erectile dysfunction. 10 tablet 11   solifenacin (VESICARE) 5 MG tablet Take 1 tablet (5 mg total) by mouth daily. 90 tablet 3   terazosin (HYTRIN) 5 MG capsule Take 5 mg by mouth at bedtime.      Testosterone 20.25 MG/1.25GM (1.62%) GEL SMARTSIG:1 Packet(s) T-DERMAL Daily     thiamine 250 MG tablet Take 250 mg by mouth daily.     tiZANidine (ZANAFLEX) 2 MG tablet Take 1 tablet (2 mg total) by mouth 2 (two) times daily as needed for muscle spasms. 30 tablet 0   traMADol (ULTRAM) 50 MG tablet Take 1 tablet (50 mg total) by mouth 3 (three) times daily as needed. 30 tablet 0   triamcinolone cream (KENALOG) 0.5 % Apply 1 application topically 3 (three) times daily. 30 g 1   Turmeric 500 MG TABS Take by mouth daily.     No current facility-administered medications on file prior to visit.    Observations/Objective: Alert, NAD, appropriate mood and affect, resps normal, cn 2-12 intact, moves all 4s, no visible rash or swelling Lab Results  Component Value Date   WBC 8.0 07/05/2020   HGB 14.6 07/05/2020   HCT 43.8 07/05/2020   PLT 182.0 07/05/2020   GLUCOSE 162 (H) 07/05/2020   CHOL 173 07/05/2020    TRIG 58.0 07/05/2020   HDL 69.80 07/05/2020   LDLDIRECT 176.0 04/03/2011   LDLCALC 92 07/05/2020   ALT 72 (H) 07/05/2020   AST 26 07/05/2020   NA 140 07/05/2020   K 4.7 07/05/2020   CL 103 07/05/2020   CREATININE 0.90 07/05/2020   BUN 16 07/05/2020   CO2 28 07/05/2020   TSH 0.65 07/05/2020   PSA 0.13 07/05/2020   HGBA1C 6.3 07/05/2020   MICROALBUR <0.7 09/14/2019   Assessment and Plan: See notes  Follow Up Instructions: See notes   I discussed the assessment and treatment plan with the patient. The  patient was provided an opportunity to ask questions and all were answered. The patient agreed with the plan and demonstrated an understanding of the instructions.   The patient was advised to call back or seek an in-person evaluation if the symptoms worsen or if the condition fails to improve as anticipated.   Cathlean Cower, MD

## 2020-12-26 ENCOUNTER — Encounter (HOSPITAL_BASED_OUTPATIENT_CLINIC_OR_DEPARTMENT_OTHER): Payer: Self-pay | Admitting: Otolaryngology

## 2020-12-26 ENCOUNTER — Other Ambulatory Visit: Payer: Self-pay

## 2020-12-27 ENCOUNTER — Encounter (HOSPITAL_BASED_OUTPATIENT_CLINIC_OR_DEPARTMENT_OTHER)
Admission: RE | Admit: 2020-12-27 | Discharge: 2020-12-27 | Disposition: A | Discharge status: Home / Self Care | Payer: Medicare PPO | Source: Ambulatory Visit | Attending: Otolaryngology | Admitting: Otolaryngology

## 2020-12-27 DIAGNOSIS — Z01812 Encounter for preprocedural laboratory examination: Secondary | ICD-10-CM | POA: Diagnosis not present

## 2020-12-27 LAB — BASIC METABOLIC PANEL
Anion gap: 5 (ref 5–15)
BUN: 14 mg/dL (ref 8–23)
CO2: 26 mmol/L (ref 22–32)
Calcium: 9.1 mg/dL (ref 8.9–10.3)
Chloride: 106 mmol/L (ref 98–111)
Creatinine, Ser: 1.12 mg/dL (ref 0.61–1.24)
GFR, Estimated: >60 mL/min (ref >60)
Glucose, Bld: 119 mg/dL — ABNORMAL HIGH (ref 70–99)
Potassium: 3.7 mmol/L (ref 3.5–5.1)
Sodium: 137 mmol/L (ref 135–145)

## 2021-01-03 ENCOUNTER — Ambulatory Visit (HOSPITAL_BASED_OUTPATIENT_CLINIC_OR_DEPARTMENT_OTHER)
Admission: RE | Admit: 2021-01-03 | Discharge: 2021-01-03 | Disposition: A | Discharge status: Home / Self Care | Payer: Medicare PPO | Attending: Otolaryngology | Admitting: Otolaryngology

## 2021-01-03 ENCOUNTER — Ambulatory Visit (HOSPITAL_BASED_OUTPATIENT_CLINIC_OR_DEPARTMENT_OTHER): Payer: Medicare PPO | Admitting: Anesthesiology

## 2021-01-03 ENCOUNTER — Encounter (HOSPITAL_BASED_OUTPATIENT_CLINIC_OR_DEPARTMENT_OTHER)
Admission: RE | Disposition: A | Discharge status: Home / Self Care | Payer: Self-pay | Source: Home / Self Care | Attending: Otolaryngology

## 2021-01-03 ENCOUNTER — Other Ambulatory Visit: Payer: Self-pay

## 2021-01-03 ENCOUNTER — Encounter (HOSPITAL_BASED_OUTPATIENT_CLINIC_OR_DEPARTMENT_OTHER): Payer: Self-pay | Admitting: Otolaryngology

## 2021-01-03 DIAGNOSIS — J343 Hypertrophy of nasal turbinates: Secondary | ICD-10-CM | POA: Diagnosis not present

## 2021-01-03 DIAGNOSIS — J3489 Other specified disorders of nose and nasal sinuses: Secondary | ICD-10-CM | POA: Diagnosis not present

## 2021-01-03 DIAGNOSIS — K219 Gastro-esophageal reflux disease without esophagitis: Secondary | ICD-10-CM | POA: Insufficient documentation

## 2021-01-03 DIAGNOSIS — I10 Essential (primary) hypertension: Secondary | ICD-10-CM | POA: Diagnosis not present

## 2021-01-03 DIAGNOSIS — J342 Deviated nasal septum: Secondary | ICD-10-CM | POA: Diagnosis not present

## 2021-01-03 HISTORY — PX: NASAL SEPTOPLASTY W/ TURBINOPLASTY: SHX2070

## 2021-01-03 SURGERY — SEPTOPLASTY, NOSE, WITH NASAL TURBINATE REDUCTION
Anesthesia: General | Site: Nose | Laterality: Bilateral

## 2021-01-03 MED ORDER — ACETAMINOPHEN 325 MG PO TABS
ORAL_TABLET | ORAL | Status: AC
  Filled 2021-01-03: qty 2

## 2021-01-03 MED ORDER — PROPOFOL 10 MG/ML IV BOLUS
INTRAVENOUS | Status: DC | PRN
  Administered 2021-01-03: 150 mg via INTRAVENOUS

## 2021-01-03 MED ORDER — ACETAMINOPHEN 160 MG/5ML PO SOLN: 325.0000 mg | ORAL | Status: DC | PRN

## 2021-01-03 MED ORDER — FENTANYL CITRATE (PF) 100 MCG/2ML IJ SOLN: 25.0000 ug | INTRAMUSCULAR | Status: DC | PRN

## 2021-01-03 MED ORDER — SUCCINYLCHOLINE CHLORIDE 200 MG/10ML IV SOSY
PREFILLED_SYRINGE | INTRAVENOUS | Status: AC
  Filled 2021-01-03: qty 10

## 2021-01-03 MED ORDER — ROCURONIUM BROMIDE 10 MG/ML (PF) SYRINGE
PREFILLED_SYRINGE | INTRAVENOUS | Status: AC
  Filled 2021-01-03: qty 10

## 2021-01-03 MED ORDER — OXYCODONE HCL 5 MG PO TABS
ORAL_TABLET | ORAL | Status: AC
  Filled 2021-01-03: qty 1

## 2021-01-03 MED ORDER — OXYMETAZOLINE HCL 0.05 % NA SOLN
NASAL | Status: AC
  Filled 2021-01-03: qty 30

## 2021-01-03 MED ORDER — LIDOCAINE HCL (CARDIAC) PF 100 MG/5ML IV SOSY
PREFILLED_SYRINGE | INTRAVENOUS | Status: DC | PRN
  Administered 2021-01-03: 50 mg via INTRAVENOUS

## 2021-01-03 MED ORDER — ACETAMINOPHEN 10 MG/ML IV SOLN: 1000.0000 mg | Freq: Once | INTRAVENOUS | Status: DC | PRN

## 2021-01-03 MED ORDER — FENTANYL CITRATE (PF) 100 MCG/2ML IJ SOLN
INTRAMUSCULAR | Status: AC
  Filled 2021-01-03: qty 2

## 2021-01-03 MED ORDER — DEXAMETHASONE SODIUM PHOSPHATE 4 MG/ML IJ SOLN
INTRAMUSCULAR | Status: DC | PRN
  Administered 2021-01-03: 10 mg via INTRAVENOUS

## 2021-01-03 MED ORDER — OXYMETAZOLINE HCL 0.05 % NA SOLN
NASAL | Status: DC | PRN
  Administered 2021-01-03: 1 via TOPICAL

## 2021-01-03 MED ORDER — LACTATED RINGERS IV SOLN: INTRAVENOUS | Status: DC

## 2021-01-03 MED ORDER — ONDANSETRON HCL 4 MG/2ML IJ SOLN
INTRAMUSCULAR | Status: AC
  Filled 2021-01-03: qty 2

## 2021-01-03 MED ORDER — CLINDAMYCIN PHOSPHATE 900 MG/50ML IV SOLN
INTRAVENOUS | Status: AC
  Filled 2021-01-03: qty 50

## 2021-01-03 MED ORDER — LIDOCAINE-EPINEPHRINE 1 %-1:100000 IJ SOLN
INTRAMUSCULAR | Status: DC | PRN
  Administered 2021-01-03: 5 mL

## 2021-01-03 MED ORDER — MIDAZOLAM HCL 2 MG/2ML IJ SOLN
INTRAMUSCULAR | Status: AC
  Filled 2021-01-03: qty 2

## 2021-01-03 MED ORDER — SUGAMMADEX SODIUM 500 MG/5ML IV SOLN
INTRAVENOUS | Status: DC | PRN
  Administered 2021-01-03: 300 mg via INTRAVENOUS

## 2021-01-03 MED ORDER — OXYCODONE-ACETAMINOPHEN 5-325 MG PO TABS: 1.0000 | ORAL_TABLET | ORAL | 0 refills | Status: AC | PRN

## 2021-01-03 MED ORDER — PHENYLEPHRINE HCL (PRESSORS) 10 MG/ML IV SOLN
INTRAVENOUS | Status: DC | PRN
  Administered 2021-01-03: 80 ug via INTRAVENOUS

## 2021-01-03 MED ORDER — AMISULPRIDE (ANTIEMETIC) 5 MG/2ML IV SOLN: 10.0000 mg | Freq: Once | INTRAVENOUS | Status: DC | PRN

## 2021-01-03 MED ORDER — ROCURONIUM BROMIDE 100 MG/10ML IV SOLN
INTRAVENOUS | Status: DC | PRN
  Administered 2021-01-03: 50 mg via INTRAVENOUS

## 2021-01-03 MED ORDER — ONDANSETRON HCL 4 MG/2ML IJ SOLN
INTRAMUSCULAR | Status: DC | PRN
  Administered 2021-01-03: 4 mg via INTRAVENOUS

## 2021-01-03 MED ORDER — DEXAMETHASONE SODIUM PHOSPHATE 10 MG/ML IJ SOLN
INTRAMUSCULAR | Status: AC
  Filled 2021-01-03: qty 1

## 2021-01-03 MED ORDER — OXYCODONE HCL 5 MG PO TABS
5.0000 mg | ORAL_TABLET | Freq: Once | ORAL | Status: AC | PRN
  Administered 2021-01-03: 5 mg via ORAL

## 2021-01-03 MED ORDER — CLINDAMYCIN HCL 300 MG PO CAPS: 300.0000 mg | ORAL_CAPSULE | Freq: Three times a day (TID) | ORAL | 0 refills | Status: AC

## 2021-01-03 MED ORDER — EPHEDRINE 5 MG/ML INJ
INTRAVENOUS | Status: AC
  Filled 2021-01-03: qty 10

## 2021-01-03 MED ORDER — OXYCODONE HCL 5 MG/5ML PO SOLN: 5.0000 mg | Freq: Once | ORAL | Status: AC | PRN

## 2021-01-03 MED ORDER — FENTANYL CITRATE (PF) 100 MCG/2ML IJ SOLN
INTRAMUSCULAR | Status: DC | PRN
  Administered 2021-01-03: 100 ug via INTRAVENOUS

## 2021-01-03 MED ORDER — MIDAZOLAM HCL 5 MG/5ML IJ SOLN
INTRAMUSCULAR | Status: DC | PRN
  Administered 2021-01-03: 1 mg via INTRAVENOUS

## 2021-01-03 MED ORDER — PROMETHAZINE HCL 25 MG/ML IJ SOLN: 6.2500 mg | INTRAMUSCULAR | Status: DC | PRN

## 2021-01-03 MED ORDER — PHENYLEPHRINE 40 MCG/ML (10ML) SYRINGE FOR IV PUSH (FOR BLOOD PRESSURE SUPPORT)
PREFILLED_SYRINGE | INTRAVENOUS | Status: AC
  Filled 2021-01-03: qty 10

## 2021-01-03 MED ORDER — ACETAMINOPHEN 325 MG PO TABS
325.0000 mg | ORAL_TABLET | ORAL | Status: DC | PRN
  Administered 2021-01-03: 650 mg via ORAL

## 2021-01-03 MED ORDER — CLINDAMYCIN PHOSPHATE 900 MG/50ML IV SOLN
INTRAVENOUS | Status: DC | PRN
  Administered 2021-01-03: 900 mg via INTRAVENOUS

## 2021-01-03 MED ORDER — SUGAMMADEX SODIUM 500 MG/5ML IV SOLN
INTRAVENOUS | Status: AC
  Filled 2021-01-03: qty 5

## 2021-01-03 MED ORDER — PHENYLEPHRINE HCL (PRESSORS) 10 MG/ML IV SOLN
INTRAVENOUS | Status: AC
  Filled 2021-01-03: qty 1

## 2021-01-03 MED ORDER — LIDOCAINE HCL (PF) 2 % IJ SOLN
INTRAMUSCULAR | Status: AC
  Filled 2021-01-03: qty 5

## 2021-01-03 MED ORDER — EPHEDRINE SULFATE 50 MG/ML IJ SOLN
INTRAMUSCULAR | Status: DC | PRN
  Administered 2021-01-03: 15 mg via INTRAVENOUS

## 2021-01-03 MED ORDER — MUPIROCIN 2 % EX OINT
TOPICAL_OINTMENT | CUTANEOUS | Status: DC | PRN
  Administered 2021-01-03: 1 via TOPICAL

## 2021-01-03 SURGICAL SUPPLY — 36 items
ATTRACTOMAT 16X20 MAGNETIC DRP (DRAPES) IMPLANT
CANISTER SUCT 1200ML W/VALVE (MISCELLANEOUS) ×2 IMPLANT
COAGULATOR SUCT 8FR VV (MISCELLANEOUS) ×2 IMPLANT
DECANTER SPIKE VIAL GLASS SM (MISCELLANEOUS) IMPLANT
DRSG NASOPORE 8CM (GAUZE/BANDAGES/DRESSINGS) IMPLANT
DRSG TELFA 3X8 NADH (GAUZE/BANDAGES/DRESSINGS) IMPLANT
ELECT REM PT RETURN 9FT ADLT (ELECTROSURGICAL) ×2
ELECTRODE REM PT RTRN 9FT ADLT (ELECTROSURGICAL) ×1 IMPLANT
GLOVE SURG ENC MOIS LTX SZ6.5 (GLOVE) ×1 IMPLANT
GLOVE SURG ENC MOIS LTX SZ7.5 (GLOVE) ×2 IMPLANT
GLOVE SURG UNDER POLY LF SZ7 (GLOVE) ×1 IMPLANT
GLOVE SURG UNDER POLY LF SZ7.5 (GLOVE) ×1 IMPLANT
GOWN STRL REUS W/ TWL LRG LVL3 (GOWN DISPOSABLE) ×2 IMPLANT
GOWN STRL REUS W/ TWL XL LVL3 (GOWN DISPOSABLE) IMPLANT
GOWN STRL REUS W/TWL LRG LVL3 (GOWN DISPOSABLE) ×4
GOWN STRL REUS W/TWL XL LVL3 (GOWN DISPOSABLE) ×2
NDL HYPO 25X1 1.5 SAFETY (NEEDLE) ×1 IMPLANT
NEEDLE HYPO 25X1 1.5 SAFETY (NEEDLE) ×2 IMPLANT
NS IRRIG 1000ML POUR BTL (IV SOLUTION) ×2 IMPLANT
PACK BASIN DAY SURGERY FS (CUSTOM PROCEDURE TRAY) ×2 IMPLANT
PACK ENT DAY SURGERY (CUSTOM PROCEDURE TRAY) ×2 IMPLANT
PAD DRESSING TELFA 3X8 NADH (GAUZE/BANDAGES/DRESSINGS) IMPLANT
SLEEVE SCD COMPRESS KNEE MED (STOCKING) ×1 IMPLANT
SOLUTION BUTLER CLEAR DIP (MISCELLANEOUS) ×2 IMPLANT
SPLINT NASAL AIRWAY SILICONE (MISCELLANEOUS) ×2 IMPLANT
SPONGE GAUZE 2X2 8PLY STRL LF (GAUZE/BANDAGES/DRESSINGS) ×2 IMPLANT
SPONGE NEURO XRAY DETECT 1X3 (DISPOSABLE) ×2 IMPLANT
SUT CHROMIC 4 0 P 3 18 (SUTURE) ×2 IMPLANT
SUT PLAIN 4 0 ~~LOC~~ 1 (SUTURE) ×2 IMPLANT
SUT PROLENE 3 0 PS 2 (SUTURE) ×2 IMPLANT
SUT VIC AB 4-0 P-3 18XBRD (SUTURE) IMPLANT
SUT VIC AB 4-0 P3 18 (SUTURE)
TOWEL GREEN STERILE FF (TOWEL DISPOSABLE) ×2 IMPLANT
TUBE SALEM SUMP 12R W/ARV (TUBING) IMPLANT
TUBE SALEM SUMP 16 FR W/ARV (TUBING) ×2 IMPLANT
YANKAUER SUCT BULB TIP NO VENT (SUCTIONS) ×2 IMPLANT

## 2021-01-03 NOTE — Anesthesia Postprocedure Evaluation (Signed)
Anesthesia Post Note  Patient: Carsin Randazzo Mase  Procedure(s) Performed: NASAL SEPTOPLASTY WITH BILATERAL TURBINATE REDUCTION (Bilateral: Nose)     Patient location during evaluation: PACU Anesthesia Type: General Level of consciousness: awake and alert Pain management: pain level controlled Vital Signs Assessment: post-procedure vital signs reviewed and stable Respiratory status: spontaneous breathing, nonlabored ventilation, respiratory function stable and patient connected to nasal cannula oxygen Cardiovascular status: blood pressure returned to baseline and stable Postop Assessment: no apparent nausea or vomiting Anesthetic complications: no   No notable events documented.  Last Vitals:  Vitals:   01/03/21 1130 01/03/21 1142  BP: (!) 156/85 (!) 157/79  Pulse: 87 82  Resp: 18 20  Temp:  36.7 C  SpO2: 96% 97%    Last Pain:  Vitals:   01/03/21 1142  TempSrc:   PainSc: 7                  Effie Berkshire

## 2021-01-03 NOTE — Anesthesia Preprocedure Evaluation (Addendum)
Anesthesia Evaluation  Patient identified by MRN, date of birth, ID band Patient awake    Reviewed: Allergy & Precautions, NPO status , Patient's Chart, lab work & pertinent test results, reviewed documented beta blocker date and time   Airway Mallampati: III  TM Distance: >3 FB Neck ROM: Full    Dental  (+) Teeth Intact, Dental Advisory Given   Pulmonary COPD, former smoker,    breath sounds clear to auscultation       Cardiovascular hypertension, Pt. on medications and Pt. on home beta blockers + Peripheral Vascular Disease   Rhythm:Regular Rate:Normal     Neuro/Psych  Headaches, Anxiety    GI/Hepatic Neg liver ROS, GERD  Medicated,  Endo/Other  negative endocrine ROS  Renal/GU negative Renal ROS     Musculoskeletal   Abdominal Normal abdominal exam  (+)   Peds  Hematology   Anesthesia Other Findings   Reproductive/Obstetrics                            Anesthesia Physical Anesthesia Plan  ASA: 3  Anesthesia Plan: General   Post-op Pain Management:    Induction: Intravenous  PONV Risk Score and Plan: 3 and Ondansetron, Dexamethasone and Midazolam  Airway Management Planned: Oral ETT  Additional Equipment: None  Intra-op Plan:   Post-operative Plan: Extubation in OR  Informed Consent: I have reviewed the patients History and Physical, chart, labs and discussed the procedure including the risks, benefits and alternatives for the proposed anesthesia with the patient or authorized representative who has indicated his/her understanding and acceptance.       Plan Discussed with: CRNA  Anesthesia Plan Comments:        Anesthesia Quick Evaluation

## 2021-01-03 NOTE — Transfer of Care (Signed)
Immediate Anesthesia Transfer of Care Note  Patient: Warren Weeks  Procedure(s) Performed: NASAL SEPTOPLASTY WITH BILATERAL TURBINATE REDUCTION (Bilateral: Nose)  Patient Location: PACU  Anesthesia Type:General  Level of Consciousness: awake, alert , oriented and patient cooperative  Airway & Oxygen Therapy: Patient Spontanous Breathing and Patient connected to face mask oxygen  Post-op Assessment: Report given to RN and Post -op Vital signs reviewed and stable  Post vital signs: Reviewed and stable  Last Vitals:  Vitals Value Taken Time  BP    Temp    Pulse 88 01/03/21 1116  Resp    SpO2 93 % 01/03/21 1116  Vitals shown include unvalidated device data.  Last Pain:  Vitals:   01/03/21 0831  TempSrc: Oral  PainSc: 4       Patients Stated Pain Goal: 4 (51/89/84 2103)  Complications: No notable events documented.

## 2021-01-03 NOTE — Op Note (Signed)
DATE OF PROCEDURE: 01/03/2021  OPERATIVE REPORT   SURGEON: Leta Baptist, MD   PREOPERATIVE DIAGNOSES:  1. Severe nasal septal deviation.  2. Bilateral inferior turbinate hypertrophy.  3. Chronic nasal obstruction.  POSTOPERATIVE DIAGNOSES:  1. Severe nasal septal deviation.  2. Bilateral inferior turbinate hypertrophy.  3. Chronic nasal obstruction.  PROCEDURE PERFORMED:  1. Septoplasty.  2. Bilateral partial inferior turbinate resection.   ANESTHESIA: General endotracheal tube anesthesia.   COMPLICATIONS: None.   ESTIMATED BLOOD LOSS: 50 mL.   INDICATION FOR PROCEDURE: Warren Weeks is a 75 y.o. male with a history of chronic nasal obstruction. The patient was treated with antihistamine, decongestant, and steroid nasal sprays. However, the patient continued to be symptomatic. On examination, the patient was noted to have bilateral severe inferior turbinate hypertrophy and significant nasal septal deviation, causing significant nasal obstruction. Based on the above findings, the decision was made for the patient to undergo the above-stated procedures. The risks, benefits, alternatives, and details of the procedures were discussed with the patient. Questions were invited and answered. Informed consent was obtained.   DESCRIPTION OF PROCEDURE: The patient was taken to the operating room and placed supine on the operating table. General endotracheal tube anesthesia was administered by the anesthesiologist. The patient was positioned, and prepped and draped in the standard fashion for nasal surgery. Pledgets soaked with Afrin were placed in both nasal cavities for decongestion. The pledgets were subsequently removed.  Examination of the nasal cavity revealed a severe nasal septal deviation. 1% lidocaine with 1:100,000 epinephrine was injected onto the nasal septum bilaterally. A hemitransfixion incision was made on the left side. The mucosal flap was carefully elevated on the left side. A  cartilaginous incision was made 1 cm superior to the caudal margin of the nasal septum. Mucosal flap was also elevated on the right side in the similar fashion. It should be noted that due to the severe septal deviation, the deviated portion of the cartilaginous and bony septum had to be removed in piecemeal fashion. Once the deviated portions were removed, a straight midline septum was achieved. The septum was then quilted with 4-0 plain gut sutures. The hemitransfixion incision was closed with interrupted 4-0 chromic sutures.   The inferior one half of both hypertrophied inferior turbinate was crossclamped with a Kelly clamp. The inferior one half of each inferior turbinate was then resected with a pair of cross cutting scissors. Hemostasis was achieved with a suction cautery device. Doyle splints were applied to the nasal septum.  The care of the patient was turned over to the anesthesiologist. The patient was awakened from anesthesia without difficulty. The patient was extubated and transferred to the recovery room in good condition.   OPERATIVE FINDINGS: Severe nasal septal deviation and bilateral inferior turbinate hypertrophy.   SPECIMEN: None.   FOLLOWUP CARE: The patient be discharged home once he is awake and alert. The patient will be placed on Percocet p.r.n. pain, and clindamycin for 3 days. The patient will follow up in my office in 3 days for splint removal.   Asya Derryberry Raynelle Bring, MD

## 2021-01-03 NOTE — Discharge Instructions (Addendum)

## 2021-01-03 NOTE — H&P (Signed)
Cc: Chronic nasal obstruction  HPI: The patient is a 75 year old male who presents today for evaluation of progressive nasal obstruction. The patient was seen in 2019 with similar issues. At that time, he was noted to have severe nasal obstruction, secondary to nasal mucosal congestion, septal deviation, and bilateral inferior turbinate hypertrophy. Surgery was discussed but the patient decided to proceed with observation. His nasal obstruction has gotten worse especially at night. This results in a dry mouth and throat. He denies any facial pain, fever, or purulent drainage. He is using Flonase with persistent symptoms noted. No previous ENT surgery is noted.   The patient's review of systems (constitutional, eyes, ENT, cardiovascular, respiratory, GI, musculoskeletal, skin, neurologic, psychiatric, endocrine, hematologic, allergic) is noted in the ROS questionnaire.  It is reviewed with the patient.   Family health history: Heart disease.  Major events: None.  Ongoing medical problems: Hypertension, reflux, arthritis.  Social history: The patient is married. He denies the use of tobacco or illegal drugs. He drinks alcohol 2-3 times a week.   Exam: General: Communicates without difficulty, well nourished, no acute distress. Head: Normocephalic, no evidence injury, no tenderness, facial buttresses intact without stepoff. Eyes: PERRL, EOMI. No scleral icterus, conjunctivae clear. Neuro: CN II exam reveals vision grossly intact. No nystagmus at any point of gaze. Ears: Auricles well formed without lesions. Ear canals are intact without mass or lesion. No erythema or edema is appreciated. The TMs are intact without fluid. Nose: External evaluation reveals normal support and skin without lesions. Dorsum is intact. Anterior rhinoscopy reveals congested mucosa over anterior aspect of inferior turbinates and intact septum. No purulence noted. Oral:  Oral cavity and oropharynx are intact, symmetric, without  erythema or edema. Mucosa is moist without lesions. Neck: Full range of motion without pain. There is no significant lymphadenopathy. No masses palpable. Thyroid bed within normal limits to palpation. Parotid glands and submandibular glands equal bilaterally without mass. Trachea is midline. Neuro:  CN 2-12 grossly intact. Gait normal. Vestibular: No nystagmus at any point of gaze. The cerebellar examination is unremarkable. A flexible scope was inserted into the right nasal cavity. NSD with spur. Endoscopy of the interior nasal cavity, superior, inferior, and middle meatus was performed. The sphenoid-ethmoid recess was examined. Edematous mucosa was noted. No polyp, mass, or lesion was appreciated. Olfactory cleft was clear. Nasopharynx was clear. Turbinates were hypertrophied but without mass. Incomplete response to decongestion. The procedure was repeated on the contralateral side with similar findings. The patient tolerated the procedure well.   Assessment  1. Bilateral chronic nasal obstruction, secondary to nasal mucosal congestion, bilateral inferior turbinate hypertrophy, and significant nasal septal deviation. The patient is also noted to have significant bilateral septal spurs on today's nasal endoscopy examination. 2. The patient has not responded to medical treatment, including the use of allergy medications and steroid nasal sprays.  Plan  1.  The nasal endoscopy findings are reviewed with the patient.  2.  The patient should continue with his Flonase nasal spray and nasal saline irrigation.  3.  The patient is a good candidate for septoplasty and bilateral turbinate reduction.  This will improve the patency of his nasal passageways.  The risks, benefits, details of the procedures are reviewed with the patient.  4.  The patient would like to proceed with the procedures.

## 2021-01-03 NOTE — Anesthesia Procedure Notes (Signed)
Procedure Name: Intubation Date/Time: 01/03/2021 10:24 AM Performed by: Willa Frater, CRNA Pre-anesthesia Checklist: Patient identified, Emergency Drugs available, Suction available and Patient being monitored Patient Re-evaluated:Patient Re-evaluated prior to induction Oxygen Delivery Method: Circle system utilized Preoxygenation: Pre-oxygenation with 100% oxygen Induction Type: IV induction Ventilation: Mask ventilation without difficulty Laryngoscope Size: Mac and 3 Grade View: Grade I Tube type: Oral Tube size: 7.5 mm Number of attempts: 1 Airway Equipment and Method: Stylet and Oral airway Placement Confirmation: ETT inserted through vocal cords under direct vision, positive ETCO2 and breath sounds checked- equal and bilateral Secured at: 23 cm Tube secured with: Tape Dental Injury: Teeth and Oropharynx as per pre-operative assessment

## 2021-01-07 ENCOUNTER — Encounter (HOSPITAL_BASED_OUTPATIENT_CLINIC_OR_DEPARTMENT_OTHER): Payer: Self-pay | Admitting: Otolaryngology

## 2021-01-21 ENCOUNTER — Other Ambulatory Visit: Payer: Self-pay

## 2021-01-21 ENCOUNTER — Ambulatory Visit: Payer: Medicare PPO | Admitting: Orthopaedic Surgery

## 2021-01-21 ENCOUNTER — Encounter: Payer: Self-pay | Admitting: Orthopaedic Surgery

## 2021-01-21 ENCOUNTER — Telehealth: Payer: Self-pay

## 2021-01-21 VITALS — Ht 69.0 in | Wt 249.0 lb

## 2021-01-21 DIAGNOSIS — M542 Cervicalgia: Secondary | ICD-10-CM

## 2021-01-21 NOTE — Telephone Encounter (Signed)
Noted  

## 2021-01-21 NOTE — Telephone Encounter (Signed)
Please submit for bil knee gel inj's

## 2021-01-21 NOTE — Progress Notes (Signed)
Office Visit Note   Patient: Warren Weeks           Date of Birth: 1945-08-05           MRN: [id number] Visit Date: 01/21/2021              Requested by: Biagio Borg, MD [place],  [place] [address] PCP: Biagio Borg, MD   Assessment & Plan: Visit Diagnoses:  1. Cervicalgia     Plan: Impression is multilevel degenerative changes with disc bulging at C3-4, C4-5, C5-6, see 6 7, and C7-T1.  Also noted is joint effusion involving the right C1 and C2 lateral mass.  At this point, the patient has tried multiple medications to include steroid taper, muscle relaxer and NSAIDs in addition to physical therapy to include dry needling all without relief.  We will go ahead and refer him to Dr. Ernestina Patches for epidural steroid injection.  He will follow-up with [place] as needed.  Call with concerns or questions.  Follow-Up Instructions: Return if symptoms worsen or fail to improve.   Orders:  No orders of the defined types were placed in this encounter.  No orders of the defined types were placed in this encounter.     Procedures: No procedures performed   Clinical Data: No additional findings.   Subjective: Chief Complaint  Patient presents with   Neck - Follow-up    MRI review    HPI patient is a pleasant 75 year old gentleman who comes in today to discuss MRI results of the cervical spine.  He has been dealing with chronic neck pain for many months.  He has been in physical therapy where he underwent dry needling without significant relief.  He is even tried multiple medications to include steroid tapers and muscle relaxers.  Subsequent MRI of the cervical spine ordered which shows multilevel degenerative changes with disc bulging at C3-4, C4-5, C5-6, see 6 7, and C7-T1.  Also noted is joint effusion involving the right C1 and C2 lateral mass.  He has not undergone ESI of the cervical spine.  Review of Systems as detailed in HPI.  All others reviewed and are  negative.   Objective: Vital Signs: Ht '5\' 9"'$  (1.753 m)   Wt 249 lb (112.9 kg)   BMI 36.77 kg/m   Physical Exam well-developed well-nourished gentleman in no acute distress.  Alert and oriented x3.    Ortho Exam unchanged cervical spine exam  Specialty Comments:  No specialty comments available.  Imaging: No new imaging   PMFS History: Patient Active Problem List   Diagnosis Date Noted   Acute sinus infection 12/25/2020   Iliotibial band syndrome affecting left lower leg 09/08/2020   Neck pain on right side 09/05/2020   OAB (overactive bladder) 07/05/2020   Pain and swelling of right lower leg 07/05/2020   Cervicalgia 07/05/2020   Right knee pain 07/05/2020   PVC's (premature ventricular contractions) 03/02/2020   BPH (benign prostatic hyperplasia) 03/02/2020   Arthritis [id number]   Diastolic dysfunction [id number]   Urinary frequency 09/14/2019   Hoarseness of voice 12/07/2018   Urinary leakage 12/07/2018   Left knee pain 12/07/2018   Chronic sinusitis 10/05/2018   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   COPD (chronic obstructive pulmonary disease) ([place]) 05/12/2017   Pleurisy 03/16/2017   Bilateral leg pain 11/22/2016   Insomnia 10/13/2016   Cough 07/22/2016   Wheezing 07/22/2016   Peripheral edema 02/21/2016   Venous insufficiency 02/05/2016   Low back pain  radiating to lower extremity 02/05/2016   Vertigo    Hypersomnolence 10/24/2015   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   Chest pain 01/18/2013   Hematochezia 10/12/2012   Viral illness 05/26/2012   Palpitations 01/05/2012   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/2011   Fatigue 04/09/2011   Rash 12/22/2010   Encounter for long-term (current) use of high-risk medication 10/08/2010   Impaired glucose tolerance 10/04/2010   Encounter for well adult exam with abnormal findings 10/04/2010   Carotid artery stenosis 07/01/2009   PVD 06/25/2009   Allergic rhinitis 09/16/2007   ESOPHAGEAL STRICTURE 09/16/2007   COLONIC  POLYPS, HX OF 09/16/2007   Anxiety state 03/28/2007   ERECTILE DYSFUNCTION 03/28/2007   Hyperlipidemia 03/28/2007   Essential hypertension 03/28/2007   LOW BACK PAIN 03/28/2007   OBESITY 03/24/2007   HEMORRHOIDS, INTERNAL 03/24/2007   ESOPHAGITIS 03/24/2007   GERD 03/24/2007   Past Medical History:  Diagnosis Date   ALLERGIC RHINITIS 09/16/2007   ALLERGY 03/24/2007   Allergy    ANXIETY 03/28/2007   Arthritis    knees, back,shoulder   CAROTID ARTERY STENOSIS, BILATERAL 07/01/2009   yearly checks - no current problems   CEPHALGIA 06/08/2009   CHEST PAIN 09/16/2007   no current problems   COLONIC POLYPS, HX OF 09/16/2007   COPD (chronic obstructive pulmonary disease) ([place]) 05/12/2017   mild per patient - no inhaler   Dizziness and giddiness 06/08/2009   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   DYSPNEA/SHORTNESS OF BREATH 09/16/2007   ERECTILE DYSFUNCTION 03/28/2007   ESOPHAGEAL STRICTURE 09/16/2007   ESOPHAGITIS 03/24/2007   GERD 03/24/2007   GLUCOSE INTOLERANCE 09/16/2007   HEMORRHOIDS, INTERNAL 03/24/2007   HYPERCHOLESTEROLEMIA 03/24/2007   HYPERLIPIDEMIA 09/16/2007   HYPERTENSION 03/28/2007   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/2011   Impaired glucose tolerance 10/04/2010   LOW BACK PAIN 03/28/2007   MAGNETIC RESONANCE IMAGING, BRAIN, ABNORMAL 06/26/2009   NECK PAIN, LEFT 06/20/2010   OBESITY 03/24/2007   OTITIS MEDIA, LEFT 06/10/2009   Palpitations 06/20/2010   no current problems   PVD 06/25/2009   RASH-NONVESICULAR 06/17/2009   URI 08/16/2009    Family History  Problem Relation Age of Onset   Lymphoma Mother    Heart disease Father        CHF   Ovarian cancer Sister    Lung cancer Sister    Coronary artery disease Sister        CABG   Coronary artery disease Brother        stent, and CAD   Colon cancer Neg Hx    Esophageal cancer Neg Hx    Rectal cancer Neg Hx    Stomach cancer Neg Hx    Colon polyps Neg Hx     Past Surgical History:  Procedure Laterality Date   COLONOSCOPY  03/2013    polyps/[place]   NASAL SEPTOPLASTY W/ TURBINOPLASTY Bilateral 01/03/2021   Procedure: NASAL SEPTOPLASTY WITH BILATERAL TURBINATE REDUCTION;  Surgeon: Leta Baptist, MD;  Location: [facility];  Service: ENT;  Laterality: Bilateral;   s/p right knee arthroscopy     UPPER GASTROINTESTINAL ENDOSCOPY     [facility]   Social History   Occupational History   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

## 2021-01-23 ENCOUNTER — Other Ambulatory Visit: Payer: Self-pay

## 2021-01-23 ENCOUNTER — Ambulatory Visit: Payer: Medicare PPO | Admitting: Physical Medicine and Rehabilitation

## 2021-01-23 ENCOUNTER — Encounter: Payer: Self-pay | Admitting: Physical Medicine and Rehabilitation

## 2021-01-23 VITALS — BP 113/60 | HR 68

## 2021-01-23 DIAGNOSIS — M47812 Spondylosis without myelopathy or radiculopathy, cervical region: Secondary | ICD-10-CM | POA: Diagnosis not present

## 2021-01-23 DIAGNOSIS — M7918 Myalgia, other site: Secondary | ICD-10-CM

## 2021-01-23 DIAGNOSIS — M542 Cervicalgia: Secondary | ICD-10-CM

## 2021-01-23 NOTE — Progress Notes (Signed)
Warren Weeks - 75 y.o. male MRN [id number]  Date of birth: Jan 09, 1946  Office Visit Note: Visit Date: 01/23/2021 PCP: Biagio Borg, MD Referred by: Biagio Borg, MD  Subjective: Chief Complaint  Patient presents with   Neck - Pain   Right Shoulder - Pain   Left Shoulder - Pain   HPI: Warren Weeks is a 75 y.o. male who comes in today per the request of Dr. Eduard Roux for evaluation of chronic, worsening and severe bilateral neck pain radiating to shoulders, right greater than left.  Patient reports pain has been ongoing for several months and describes as soreness and sharp sensation, rates as 8 out of 10 at present.  Patient reports pain is exacerbated by movement and activity.  Patient reports some pain relief with use of ice/heat and medications at home.  Patient reports recent sessions of physical therapy in-house at our facility.  Patient reports minimal relief of pain with physical therapy and dry needling.  Patient reports he feels like nothing helps to ease his pain long-term and is inquiring about cervical epidural steroid injections.  Patient states he had lumbar epidural injection by Dr. Magnus Sinning several years ago with great success.  Patient's recent cervical MRI exhibits multilevel degenerative changes and mild disc bulging, significant uncovertebral joint hypertrophy at C6-7 and also right C1 and C2 lateral mass high signal intensity with a joint effusion.  Patient reports the most severe pain for him is on the right side of his neck and he reports that the radiation to bilateral shoulders is an intermittent type of pain that he does not experience every day. Patient reports his neck pain makes it difficult to complete daily tasks. Patient denies focal weakness, numbness and tingling.  Patient denies any recent trauma or falls.  Patient currently ambulates with cane.  Review of Systems  Musculoskeletal:  Positive for myalgias and neck pain.       Pt reports right sided  neck pain is the most severe, also reports he feels like his muscles are tight across his upper back.   Neurological:  Negative for tingling and focal weakness.  All other systems reviewed and are negative. Otherwise per HPI.  Assessment & Plan: Visit Diagnoses:    ICD-10-CM   1. Cervicalgia  M54.2     2. Spondylosis without myelopathy or radiculopathy, cervical region  M47.812     3. Facet hypertrophy of cervical region  M47.812     4. Myofascial pain  M79.18        Plan: Findings:  Chronic, worsening and severe bilateral neck pain radiating to shoulders, right greater than left. Patient's imaging and clinical presentation are consistent with facet mediated pain. Patient continues to have excruciating right-sided neck pain with minimal relief of pain with conservative therapies such as medications and physical therapy/dry needling. Patient's recent cervical MRI discussed using images and spine model.  His pain pattern is likely multifactorial and most of the pain does seem to be probably from the C1-2 inflammation but is also consistent with more of a stenosis pattern he does have uncovertebral joint hypertrophy at C6-7 which would account for some of the bilateral shoulder pain.  Lastly does have some myofascial pain.  We discussed the significant risk of intersection of the vertebral artery across the joint space at C1-2 and this prevents really safe fluoroscopically guided injection at that area.  Injection could possibly be done by CT scan at a facility such as [facility]  or [facility].  I think enough of his pain is related to the narrowing in the spine that at least an attempt at epidural injection diagnostically would be worthwhile.  Neck step is diagnostic and hopefully therapeutic C7-T1 interlaminar epidural steroid injection fluoroscopic guidance.  Depending on results would discuss C1-2 problem.  Patient is encouraged to continue stretching exercises and physical therapy to help with  myofascial pain. No red flag symptoms noted upon exam.    Meds & Orders: No orders of the defined types were placed in this encounter.  No orders of the defined types were placed in this encounter.   Follow-up: Return in about 1 week (around 01/30/2021) for Right C7-T1 interlaminar epidural steroid injection.   Procedures: No procedures performed      Clinical History: MRI CERVICAL SPINE WITHOUT CONTRAST   TECHNIQUE: Multiplanar, multisequence MR imaging of the cervical spine was performed. No intravenous contrast was administered.   COMPARISON:  None.   FINDINGS: Alignment: No significant listhesis.   Vertebrae: Vertebral body heights are maintained. Abnormal T1 marrow signal with STIR hyperintensity in the right C1 and C2 lateral masses. There is a C1-C2 joint effusion.   Cord: No abnormal signal.   Posterior Fossa, vertebral arteries, paraspinal tissues: Unremarkable.   Disc levels:   C2-C3:  No canal or foraminal stenosis.   C3-C4: Small central disc protrusion. No canal or foraminal stenosis.   C4-C5: Mild disc bulge with endplate osteophytes. No canal or foraminal stenosis.   C5-C6: Mild disc bulge with endplate osteophytes. Left uncovertebral hypertrophy. No canal or right foraminal stenosis. Minor left foraminal stenosis.   C6-C7: Disc bulge with endplate osteophytes. Uncovertebral hypertrophy. No canal stenosis. No right foraminal stenosis. Mild left foraminal stenosis.   C7-T1: Disc bulge slightly eccentric to the left with endplate osteophytes. No canal or foraminal stenosis.   IMPRESSION: Multilevel degenerative changes as detailed above without high-grade stenosis.   There is abnormal marrow signal involving the right C1 and C2 lateral masses with a joint effusion. Favored to be on a degenerative basis given reported chronicity of neck pain.     Electronically Signed   By: Macy Mis M.D.   On: 12/23/2020 19:26 ___ MRI LUMBAR SPINE  WITHOUT CONTRAST   TECHNIQUE: Multiplanar, multisequence MR imaging of the lumbar spine was performed. No intravenous contrast was administered.   COMPARISON: None.   FINDINGS: Segmentation: Standard.   Alignment: Physiologic.   Vertebrae: No fracture, evidence of discitis, or bone lesion.   Conus medullaris: Extends to the L2 level and appears normal.   Paraspinal and other soft tissues: Negative.   Disc levels:   L1-2: No significant disc displacement, foraminal narrowing, or canal stenosis.   L2-3: No significant disc displacement, foraminal narrowing, or canal stenosis. Mild facet hypertrophy.   L3-4: Small disc bulge with moderate bilateral facet and mild ligamentum flavum hypertrophy. Mild bilateral foraminal and lateral recess narrowing. No significant canal stenosis. Right facet effusion.   L4-5: Small disc bulge with moderate facet and ligamentum flavum hypertrophy bilaterally. Moderate bilateral foraminal and lateral recess narrowing. Mild canal stenosis.   L5-S1: Small disc bulge with mild bilateral facet and ligamentum flavum hypertrophy. Moderate bilateral foraminal narrowing. No significant canal stenosis.   IMPRESSION: 1. 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 2. Mild L4-5 canal stenosis. No high-grade canal stenosis. 3. Foraminal narrowing is mild bilaterally at L3-4, moderate bilaterally at L4-5, and moderate bilaterally at L5-S1. 4. Right L3-4 facet effusion is  likely degenerative.     Electronically Signed By: Kristine Garbe M.D. On: 12/06/2016 15:07   He reports that he has quit smoking. His smoking use included cigarettes. He has a 7.50 pack-year smoking history. He has never used smokeless tobacco.  Recent Labs    07/03/20 1140 07/05/20 1043  HGBA1C  --  6.3  LABURIC 5.2 4.5    Objective:  VS:  HT:    WT:   BMI:     BP:113/60  HR:68bpm  TEMP: ( )  RESP:  Physical  Exam HENT:     Head: Normocephalic and atraumatic.     Right Ear: Tympanic membrane normal.     Left Ear: Tympanic membrane normal.     Nose: Nose normal.     Mouth/Throat:     Mouth: Mucous membranes are dry.  Eyes:     Pupils: Pupils are equal, round, and reactive to light.  Cardiovascular:     Rate and Rhythm: Normal rate and regular rhythm.     Pulses: Normal pulses.  Pulmonary:     Effort: Pulmonary effort is normal.  Abdominal:     General: Abdomen is flat. There is no distension.  Musculoskeletal:     Cervical back: Tenderness present.     Comments: Discomfort noted to right side of neck with flexion, extension and side-to-side rotation. Pain noted with facet loading. Good strength noted to bilateral upper extremities. Sensation intact bilaterally. Tenderness noted upon palpation of bilateral trapezius and levator scapulae muscles. Negative Hoffman's sign.    Skin:    General: Skin is warm and dry.     Capillary Refill: Capillary refill takes less than 2 seconds.  Neurological:     General: No focal deficit present.     Mental Status: He is alert.  Psychiatric:        Mood and Affect: Mood normal.    Ortho Exam  Imaging: No results found.  Past Medical/Family/Surgical/Social History: Medications & Allergies reviewed per EMR, new medications updated. Patient Active Problem List   Diagnosis Date Noted   Acute sinus infection 12/25/2020   Iliotibial band syndrome affecting left lower leg 09/08/2020   Neck pain on right side 09/05/2020   OAB (overactive bladder) 07/05/2020   Pain and swelling of right lower leg 07/05/2020   Cervicalgia 07/05/2020   Right knee pain 07/05/2020   PVC's (premature ventricular contractions) 03/02/2020   BPH (benign prostatic hyperplasia) 03/02/2020   Arthritis [id number]   Diastolic dysfunction [id number]   Urinary frequency 09/14/2019   Hoarseness of voice 12/07/2018   Urinary leakage 12/07/2018   Left knee pain 12/07/2018    Chronic sinusitis 10/05/2018   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   COPD (chronic obstructive pulmonary disease) ([place]) 05/12/2017   Pleurisy 03/16/2017   Bilateral leg pain 11/22/2016   Insomnia 10/13/2016   Cough 07/22/2016   Wheezing 07/22/2016   Peripheral edema 02/21/2016   Venous insufficiency 02/05/2016   Low back pain radiating to lower extremity 02/05/2016   Vertigo    Hypersomnolence 10/24/2015   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[place]) 12/14/2014   Chest pain 01/18/2013   Hematochezia 10/12/2012   Viral illness 05/26/2012   Palpitations 01/05/2012   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/2011   Fatigue 04/09/2011   Rash 12/22/2010   Encounter for long-term (current) use of high-risk medication 10/08/2010   Impaired glucose tolerance 10/04/2010   Encounter for well adult exam with abnormal findings 10/04/2010   Carotid artery stenosis 07/01/2009   PVD 06/25/2009   Allergic rhinitis 09/16/2007   ESOPHAGEAL  STRICTURE 09/16/2007   COLONIC POLYPS, HX OF 09/16/2007   Anxiety state 03/28/2007   ERECTILE DYSFUNCTION 03/28/2007   Hyperlipidemia 03/28/2007   Essential hypertension 03/28/2007   LOW BACK PAIN 03/28/2007   OBESITY 03/24/2007   HEMORRHOIDS, INTERNAL 03/24/2007   ESOPHAGITIS 03/24/2007   GERD 03/24/2007   Past Medical History:  Diagnosis Date   ALLERGIC RHINITIS 09/16/2007   ALLERGY 03/24/2007   Allergy    ANXIETY 03/28/2007   Arthritis    knees, back,shoulder   CAROTID ARTERY STENOSIS, BILATERAL 07/01/2009   yearly checks - no current problems   CEPHALGIA 06/08/2009   CHEST PAIN 09/16/2007   no current problems   COLONIC POLYPS, HX OF 09/16/2007   COPD (chronic obstructive pulmonary disease) ([place]) 05/12/2017   mild per patient - no inhaler   Dizziness and giddiness 06/08/2009   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   DYSPNEA/SHORTNESS OF BREATH 09/16/2007   ERECTILE DYSFUNCTION 03/28/2007   ESOPHAGEAL STRICTURE 09/16/2007   ESOPHAGITIS 03/24/2007   GERD 03/24/2007   GLUCOSE INTOLERANCE  09/16/2007   HEMORRHOIDS, INTERNAL 03/24/2007   HYPERCHOLESTEROLEMIA 03/24/2007   HYPERLIPIDEMIA 09/16/2007   HYPERTENSION 03/28/2007   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/2011   Impaired glucose tolerance 10/04/2010   LOW BACK PAIN 03/28/2007   MAGNETIC RESONANCE IMAGING, BRAIN, ABNORMAL 06/26/2009   NECK PAIN, LEFT 06/20/2010   OBESITY 03/24/2007   OTITIS MEDIA, LEFT 06/10/2009   Palpitations 06/20/2010   no current problems   PVD 06/25/2009   RASH-NONVESICULAR 06/17/2009   URI 08/16/2009   Family History  Problem Relation Age of Onset   Lymphoma Mother    Heart disease Father        CHF   Ovarian cancer Sister    Lung cancer Sister    Coronary artery disease Sister        CABG   Coronary artery disease Brother        stent, and CAD   Colon cancer Neg Hx    Esophageal cancer Neg Hx    Rectal cancer Neg Hx    Stomach cancer Neg Hx    Colon polyps Neg Hx    Past Surgical History:  Procedure Laterality Date   COLONOSCOPY  03/2013   polyps/[place]   NASAL SEPTOPLASTY W/ TURBINOPLASTY Bilateral 01/03/2021   Procedure: NASAL SEPTOPLASTY WITH BILATERAL TURBINATE REDUCTION;  Surgeon: Leta Baptist, MD;  Location: [facility];  Service: ENT;  Laterality: Bilateral;   s/p right knee arthroscopy     UPPER GASTROINTESTINAL ENDOSCOPY     [facility]   Social History   Occupational History   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

## 2021-01-23 NOTE — Progress Notes (Signed)
Pt state neck pain that travels to both shoulders. Pt state turning his head and bending over makes the pain worse. Pt state when he gets up in the morning he has to put a ice pack on his neck. Pt state he takes pain meds and pain cream to help ease his pain.  Numeric Pain Rating Scale and Functional Assessment Average Pain 10 Pain Right Now 8 My pain is constant, burning, stabbing, and aching Pain is worse with: bending, standing, and some activites Pain improves with: heat/ice and medication   In the last MONTH (on 0-10 scale) has pain interfered with the following?  1. General activity like being  able to carry out your everyday physical activities such as walking, climbing stairs, carrying groceries, or moving a chair?  Rating(8)  2. Relation with others like being able to carry out your usual social activities and roles such as  activities at home, at work and in your community. Rating(9)  3. Enjoyment of life such that you have  been bothered by emotional problems such as feeling anxious, depressed or irritable?  Rating(10)

## 2021-01-30 ENCOUNTER — Ambulatory Visit: Payer: Self-pay

## 2021-01-30 ENCOUNTER — Telehealth: Payer: Self-pay | Admitting: Physical Medicine and Rehabilitation

## 2021-01-30 ENCOUNTER — Encounter: Payer: Self-pay | Admitting: Physical Medicine and Rehabilitation

## 2021-01-30 ENCOUNTER — Other Ambulatory Visit: Payer: Self-pay

## 2021-01-30 ENCOUNTER — Ambulatory Visit: Payer: Medicare PPO | Admitting: Physical Medicine and Rehabilitation

## 2021-01-30 VITALS — BP 121/68 | HR 68

## 2021-01-30 DIAGNOSIS — M5412 Radiculopathy, cervical region: Secondary | ICD-10-CM

## 2021-01-30 MED ORDER — BETAMETHASONE SOD PHOS & ACET 6 (3-3) MG/ML IJ SUSP
12.0000 mg | Freq: Once | INTRAMUSCULAR | Status: AC
  Administered 2021-01-30: 12 mg

## 2021-01-30 NOTE — Telephone Encounter (Signed)
Pt asking if he can take an anti inflammatory medication prior to his appt today or will he need to wait. The best call back number is [phone].

## 2021-01-30 NOTE — Progress Notes (Signed)
Pt state neck pain that travels to both shoulder, most his right side. Pt state turning his head makes the pain worse. Pt state he takes pain meds to help ease his pain.  Numeric Pain Rating Scale and Functional Assessment Average Pain 5   In the last MONTH (on 0-10 scale) has pain interfered with the following?  1. General activity like being  able to carry out your everyday physical activities such as walking, climbing stairs, carrying groceries, or moving a chair?  Rating(10)   +Driver, -BT, -Dye Allergies.

## 2021-01-30 NOTE — Procedures (Signed)
Cervical Epidural Steroid Injection - Interlaminar Approach with Fluoroscopic Guidance  Patient: Warren Weeks      Date of Birth: [birth year] MRN: [id number] PCP: Biagio Borg, MD      Visit Date: 01/30/2021   Universal Protocol:    Date/Time: 08/11/222:18 PM  Consent Given By: the patient  Position: PRONE  Additional Comments: Vital signs were monitored before and after the procedure. Patient was prepped and draped in the usual sterile fashion. The correct patient, procedure, and site was verified.   Injection Procedure Details:   Procedure diagnoses: Cervical radiculopathy [M54.12]    Meds Administered:  Meds ordered this encounter  Medications   betamethasone acetate-betamethasone sodium phosphate (CELESTONE) injection 12 mg     Laterality: Right  Location/Site: C7-T1  Needle: 3.5 in., 20 ga. Tuohy  Needle Placement: Paramedian epidural space  Findings:  -Comments: Excellent flow of contrast into the epidural space.  Procedure Details: Using a paramedian approach from the side mentioned above, the region overlying the inferior lamina was localized under fluoroscopic visualization and the soft tissues overlying this structure were infiltrated with 4 ml. of 1% Lidocaine without Epinephrine. A # 20 gauge, Tuohy needle was inserted into the epidural space using a paramedian approach.  The epidural space was localized using loss of resistance along with contralateral oblique bi-planar fluoroscopic views.  After negative aspirate for air, blood, and CSF, a 2 ml. volume of Isovue-250 was injected into the epidural space and the flow of contrast was observed. Radiographs were obtained for documentation purposes.   The injectate was administered into the level noted above.  Additional Comments:  The patient tolerated the procedure well Dressing: 2 x 2 sterile gauze and Band-Aid    Post-procedure details: Patient was observed during the procedure. Post-procedure  instructions were reviewed.  Patient left the clinic in stable condition.

## 2021-01-30 NOTE — Telephone Encounter (Signed)
Called patient and left message to advise. That any medications other than anticoagulants are ok to take prior to the procedure. I asked that he call us back if he is taking any anticoagulants.

## 2021-01-30 NOTE — Patient Instructions (Signed)

## 2021-01-30 NOTE — Progress Notes (Signed)
Warren Weeks - 75 y.o. male MRN [id number]  Date of birth: 07-01-1945  Office Visit Note: Visit Date: 01/30/2021 PCP: Biagio Borg, MD Referred by: Biagio Borg, MD  Subjective: Chief Complaint  Patient presents with   Neck - Pain   Right Shoulder - Pain   Left Shoulder - Pain   HPI:  Warren Weeks is a 75 y.o. male who comes in today for planned Right C7-T1 Cervical Interlaminar epidural steroid injection with fluoroscopic guidance.  The patient has failed conservative care including home exercise, medications, time and activity modification.  This injection will be diagnostic and hopefully therapeutic.  Please see requesting physician notes for further details and justification.  He was somewhat confused today thinking we were going to do a lumbar injection for his neck.  I think the miscommunication happened and that he has a pretty significant finding at C1-2 and this can be reviewed in the prior notes.  This is on the right but we do have difficulty with a C1-2 facet injection given the location of the vertebral artery.  This was discussed with him but I think he thought when I set I would do the shot at a lower position he thought he was getting a lower back injection.  He is definitely fine getting the injection today in the cervical spine as we had really talked about before.  This would be an epidural injection because he does have some stenosis as well.  Depending on relief organ to have to figure out what to do with the C1-2 region.   ROS Otherwise per HPI.  Assessment & Plan: Visit Diagnoses:    ICD-10-CM   1. Cervical radiculopathy  M54.12 XR C-ARM NO REPORT    Epidural Steroid injection    betamethasone acetate-betamethasone sodium phosphate (CELESTONE) injection 12 mg      Plan: No additional findings.   Meds & Orders:  Meds ordered this encounter  Medications   betamethasone acetate-betamethasone sodium phosphate (CELESTONE) injection 12 mg    Orders Placed This  Encounter  Procedures   XR C-ARM NO REPORT   Epidural Steroid injection    Follow-up: Return if symptoms worsen or fail to improve.   Procedures: No procedures performed  Cervical Epidural Steroid Injection - Interlaminar Approach with Fluoroscopic Guidance  Patient: Warren Weeks      Date of Birth: 02-Oct-1945 MRN: [id number] PCP: Biagio Borg, MD      Visit Date: 01/30/2021   Universal Protocol:    Date/Time: 08/11/222:18 PM  Consent Given By: the patient  Position: PRONE  Additional Comments: Vital signs were monitored before and after the procedure. Patient was prepped and draped in the usual sterile fashion. The correct patient, procedure, and site was verified.   Injection Procedure Details:   Procedure diagnoses: Cervical radiculopathy [M54.12]    Meds Administered:  Meds ordered this encounter  Medications   betamethasone acetate-betamethasone sodium phosphate (CELESTONE) injection 12 mg     Laterality: Right  Location/Site: C7-T1  Needle: 3.5 in., 20 ga. Tuohy  Needle Placement: Paramedian epidural space  Findings:  -Comments: Excellent flow of contrast into the epidural space.  Procedure Details: Using a paramedian approach from the side mentioned above, the region overlying the inferior lamina was localized under fluoroscopic visualization and the soft tissues overlying this structure were infiltrated with 4 ml. of 1% Lidocaine without Epinephrine. A # 20 gauge, Tuohy needle was inserted into the epidural space using a paramedian approach.  The  epidural space was localized using loss of resistance along with contralateral oblique bi-planar fluoroscopic views.  After negative aspirate for air, blood, and CSF, a 2 ml. volume of Isovue-250 was injected into the epidural space and the flow of contrast was observed. Radiographs were obtained for documentation purposes.   The injectate was administered into the level noted above.  Additional Comments:   The patient tolerated the procedure well Dressing: 2 x 2 sterile gauze and Band-Aid    Post-procedure details: Patient was observed during the procedure. Post-procedure instructions were reviewed.  Patient left the clinic in stable condition.   Clinical History: MRI CERVICAL SPINE WITHOUT CONTRAST   TECHNIQUE: Multiplanar, multisequence MR imaging of the cervical spine was performed. No intravenous contrast was administered.   COMPARISON:  None.   FINDINGS: Alignment: No significant listhesis.   Vertebrae: Vertebral body heights are maintained. Abnormal T1 marrow signal with STIR hyperintensity in the right C1 and C2 lateral masses. There is a C1-C2 joint effusion.   Cord: No abnormal signal.   Posterior Fossa, vertebral arteries, paraspinal tissues: Unremarkable.   Disc levels:   C2-C3:  No canal or foraminal stenosis.   C3-C4: Small central disc protrusion. No canal or foraminal stenosis.   C4-C5: Mild disc bulge with endplate osteophytes. No canal or foraminal stenosis.   C5-C6: Mild disc bulge with endplate osteophytes. Left uncovertebral hypertrophy. No canal or right foraminal stenosis. Minor left foraminal stenosis.   C6-C7: Disc bulge with endplate osteophytes. Uncovertebral hypertrophy. No canal stenosis. No right foraminal stenosis. Mild left foraminal stenosis.   C7-T1: Disc bulge slightly eccentric to the left with endplate osteophytes. No canal or foraminal stenosis.   IMPRESSION: Multilevel degenerative changes as detailed above without high-grade stenosis.   There is abnormal marrow signal involving the right C1 and C2 lateral masses with a joint effusion. Favored to be on a degenerative basis given reported chronicity of neck pain.     Electronically Signed   By: Macy Mis M.D.   On: 12/23/2020 19:26 ___ MRI LUMBAR SPINE WITHOUT CONTRAST   TECHNIQUE: Multiplanar, multisequence MR imaging of the lumbar spine was performed. No  intravenous contrast was administered.   COMPARISON: None.   FINDINGS: Segmentation: Standard.   Alignment: Physiologic.   Vertebrae: No fracture, evidence of discitis, or bone lesion.   Conus medullaris: Extends to the L2 level and appears normal.   Paraspinal and other soft tissues: Negative.   Disc levels:   L1-2: No significant disc displacement, foraminal narrowing, or canal stenosis.   L2-3: No significant disc displacement, foraminal narrowing, or canal stenosis. Mild facet hypertrophy.   L3-4: Small disc bulge with moderate bilateral facet and mild ligamentum flavum hypertrophy. Mild bilateral foraminal and lateral recess narrowing. No significant canal stenosis. Right facet effusion.   L4-5: Small disc bulge with moderate facet and ligamentum flavum hypertrophy bilaterally. Moderate bilateral foraminal and lateral recess narrowing. Mild canal stenosis.   L5-S1: Small disc bulge with mild bilateral facet and ligamentum flavum hypertrophy. Moderate bilateral foraminal narrowing. No significant canal stenosis.   IMPRESSION: 1. 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 2. Mild L4-5 canal stenosis. No high-grade canal stenosis. 3. Foraminal narrowing is mild bilaterally at L3-4, moderate bilaterally at L4-5, and moderate bilaterally at L5-S1. 4. Right L3-4 facet effusion is likely degenerative.     Electronically Signed By: Kristine Garbe M.D. On: 12/06/2016 15:07     Objective:  VS:  HT:    WT:  BMI:     BP:121/68  HR:68bpm  TEMP: ( )  RESP:  Physical Exam Vitals and nursing note reviewed.  Constitutional:      General: He is not in acute distress.    Appearance: Normal appearance. He is not ill-appearing.  HENT:     Head: Normocephalic and atraumatic.     Right Ear: External ear normal.     Left Ear: External ear normal.  Eyes:     Extraocular Movements: Extraocular movements intact.   Cardiovascular:     Rate and Rhythm: Normal rate.     Pulses: Normal pulses.  Abdominal:     General: There is no distension.     Palpations: Abdomen is soft.  Musculoskeletal:        General: No signs of injury.     Cervical back: Neck supple. Tenderness present. No rigidity.     Right lower leg: No edema.     Left lower leg: No edema.     Comments: Patient has good strength in the upper extremities with 5 out of 5 strength in wrist extension long finger flexion APB.  No intrinsic hand muscle atrophy.  Negative Hoffmann's test.  Lymphadenopathy:     Cervical: No cervical adenopathy.  Skin:    Findings: No erythema or rash.  Neurological:     General: No focal deficit present.     Mental Status: He is alert and oriented to person, place, and time.     Sensory: No sensory deficit.     Motor: No weakness or abnormal muscle tone.     Coordination: Coordination normal.  Psychiatric:        Mood and Affect: Mood normal.        Behavior: Behavior normal.     Imaging: XR C-ARM NO REPORT  Result Date: 01/30/2021 Please see Notes tab for imaging impression.

## 2021-01-31 DIAGNOSIS — E291 Testicular hypofunction: Secondary | ICD-10-CM | POA: Diagnosis not present

## 2021-02-07 DIAGNOSIS — E291 Testicular hypofunction: Secondary | ICD-10-CM | POA: Diagnosis not present

## 2021-02-07 DIAGNOSIS — N401 Enlarged prostate with lower urinary tract symptoms: Secondary | ICD-10-CM | POA: Diagnosis not present

## 2021-02-07 DIAGNOSIS — R351 Nocturia: Secondary | ICD-10-CM | POA: Diagnosis not present

## 2021-02-07 DIAGNOSIS — N5201 Erectile dysfunction due to arterial insufficiency: Secondary | ICD-10-CM | POA: Diagnosis not present

## 2021-02-13 ENCOUNTER — Other Ambulatory Visit: Payer: Self-pay | Admitting: Internal Medicine

## 2021-02-14 NOTE — Telephone Encounter (Signed)
Please refill as per office routine med refill policy (all routine meds to be refilled for 3 mo or monthly (per pt preference) up to one year from last visit, then month to month grace period for 3 mo, then further med refills will have to be denied) ? ?

## 2021-02-18 ENCOUNTER — Other Ambulatory Visit: Payer: Self-pay

## 2021-02-25 ENCOUNTER — Telehealth: Payer: Self-pay | Admitting: Physical Medicine and Rehabilitation

## 2021-02-25 DIAGNOSIS — M542 Cervicalgia: Secondary | ICD-10-CM

## 2021-02-25 NOTE — Telephone Encounter (Signed)
Pt called and states the injection on 01/30/2021 did not work for him. He got the injection in his neck. He would like a call back. He wasn't sure if you wanted to try something else.   CB [phone]

## 2021-02-26 NOTE — Telephone Encounter (Signed)
Referral placed.

## 2021-02-26 NOTE — Addendum Note (Signed)
Addended by: Sherre Scarlet B on: 02/26/2021 08:25 AM   Modules accepted: Orders

## 2021-02-27 ENCOUNTER — Other Ambulatory Visit: Payer: Self-pay | Admitting: Internal Medicine

## 2021-02-27 NOTE — Telephone Encounter (Signed)
Please refill as per office routine med refill policy (all routine meds to be refilled for 3 mo or monthly (per pt preference) up to one year from last visit, then month to month grace period for 3 mo, then further med refills will have to be denied) ? ?

## 2021-02-28 ENCOUNTER — Telehealth: Payer: Self-pay | Admitting: Physical Medicine and Rehabilitation

## 2021-02-28 NOTE — Telephone Encounter (Signed)
Pt called stating he misseds a call and he also was appo=roved for an injection by his insurance company. Pt would like a CB to set appt up.   [phone]

## 2021-03-02 DIAGNOSIS — G8929 Other chronic pain: Secondary | ICD-10-CM | POA: Diagnosis not present

## 2021-03-02 DIAGNOSIS — E785 Hyperlipidemia, unspecified: Secondary | ICD-10-CM | POA: Diagnosis not present

## 2021-03-02 DIAGNOSIS — K219 Gastro-esophageal reflux disease without esophagitis: Secondary | ICD-10-CM | POA: Diagnosis not present

## 2021-03-02 DIAGNOSIS — M545 Low back pain, unspecified: Secondary | ICD-10-CM | POA: Diagnosis not present

## 2021-03-02 DIAGNOSIS — E291 Testicular hypofunction: Secondary | ICD-10-CM | POA: Diagnosis not present

## 2021-03-02 DIAGNOSIS — I10 Essential (primary) hypertension: Secondary | ICD-10-CM | POA: Diagnosis not present

## 2021-03-02 DIAGNOSIS — J449 Chronic obstructive pulmonary disease, unspecified: Secondary | ICD-10-CM | POA: Diagnosis not present

## 2021-03-02 DIAGNOSIS — M1990 Unspecified osteoarthritis, unspecified site: Secondary | ICD-10-CM | POA: Diagnosis not present

## 2021-03-03 NOTE — Telephone Encounter (Signed)
Scheduled appointment

## 2021-03-05 ENCOUNTER — Telehealth: Payer: Self-pay

## 2021-03-05 NOTE — Telephone Encounter (Signed)
VOB submitted for Monovisc, bilateral knee. Pending BV. 

## 2021-03-10 ENCOUNTER — Other Ambulatory Visit: Payer: Self-pay | Admitting: Internal Medicine

## 2021-03-11 NOTE — Telephone Encounter (Signed)
Please refill as per office routine med refill policy (all routine meds to be refilled for 3 mo or monthly (per pt preference) up to one year from last visit, then month to month grace period for 3 mo, then further med refills will have to be denied) ? ?

## 2021-03-12 ENCOUNTER — Other Ambulatory Visit: Payer: Self-pay

## 2021-03-12 ENCOUNTER — Ambulatory Visit (INDEPENDENT_AMBULATORY_CARE_PROVIDER_SITE_OTHER): Payer: Medicare PPO | Admitting: Internal Medicine

## 2021-03-12 ENCOUNTER — Encounter: Payer: Self-pay | Admitting: Internal Medicine

## 2021-03-12 ENCOUNTER — Telehealth: Payer: Self-pay

## 2021-03-12 VITALS — BP 132/70 | HR 57 | Temp 98.1°F | Ht 69.0 in | Wt 252.0 lb

## 2021-03-12 DIAGNOSIS — R7302 Impaired glucose tolerance (oral): Secondary | ICD-10-CM

## 2021-03-12 DIAGNOSIS — E7800 Pure hypercholesterolemia, unspecified: Secondary | ICD-10-CM

## 2021-03-12 DIAGNOSIS — Z23 Encounter for immunization: Secondary | ICD-10-CM | POA: Diagnosis not present

## 2021-03-12 DIAGNOSIS — I10 Essential (primary) hypertension: Secondary | ICD-10-CM | POA: Diagnosis not present

## 2021-03-12 DIAGNOSIS — M47812 Spondylosis without myelopathy or radiculopathy, cervical region: Secondary | ICD-10-CM | POA: Insufficient documentation

## 2021-03-12 LAB — BASIC METABOLIC PANEL
BUN: 18 mg/dL (ref 6–23)
CO2: 27 mEq/L (ref 19–32)
Calcium: 9.1 mg/dL (ref 8.4–10.5)
Chloride: 109 mEq/L (ref 96–112)
Creatinine, Ser: 1.11 mg/dL (ref 0.40–1.50)
GFR: 66.21 mL/min (ref >60.00)
Glucose, Bld: 113 mg/dL — ABNORMAL HIGH (ref 70–99)
Potassium: 4.9 mEq/L (ref 3.5–5.1)
Sodium: 143 mEq/L (ref 135–145)

## 2021-03-12 LAB — LIPID PANEL
Cholesterol: 152 mg/dL (ref 0–200)
HDL: 57.9 mg/dL (ref >39.00)
LDL Cholesterol: 78 mg/dL (ref 0–99)
NonHDL: 93.73
Total CHOL/HDL Ratio: 3
Triglycerides: 78 mg/dL (ref 0.0–149.0)
VLDL: 15.6 mg/dL (ref 0.0–40.0)

## 2021-03-12 LAB — HEPATIC FUNCTION PANEL
ALT: 64 U/L — ABNORMAL HIGH (ref 0–53)
AST: 51 U/L — ABNORMAL HIGH (ref 0–37)
Albumin: 4 g/dL (ref 3.5–5.2)
Alkaline Phosphatase: 66 U/L (ref 39–117)
Bilirubin, Direct: 0.2 mg/dL (ref 0.0–0.3)
Total Bilirubin: 0.6 mg/dL (ref 0.2–1.2)
Total Protein: 6.9 g/dL (ref 6.0–8.3)

## 2021-03-12 LAB — HEMOGLOBIN A1C: Hgb A1c MFr Bld: 6.6 % — ABNORMAL HIGH (ref 4.6–6.5)

## 2021-03-12 NOTE — Assessment & Plan Note (Signed)
Stable, to f/u ortho setp 26 as planned, may need NS if not helping

## 2021-03-12 NOTE — Progress Notes (Signed)
Patient ID: Warren Weeks, male   DOB: [birth year], 75 y.o.   MRN: [id number]        Chief Complaint: follow up HTN, HLD and hyperglycemia        HPI:  Warren Weeks is a 75 y.o. male here overall doing ok, Pt denies chest pain, increased sob or doe, wheezing, orthopnea, PND, increased LE swelling, palpitations, dizziness or syncope.   Pt denies polydipsia, polyuria, or new focal neuro s/s.   Pt denies fever, wt loss, night sweats, loss of appetite, or other constitutional symptoms    Also, Using cane, no falls, due to knee pain, and neck pain. Saw ortho with PT and ESI and f/u sept 26 but still hurts to turn to the right  had MRI with DDD. Due for flu shot.  No other new complaints  Wt Readings from Last 3 Encounters:  03/12/21 252 lb (114.3 kg)  01/21/21 249 lb (112.9 kg)  01/03/21 249 lb 1.9 oz (113 kg)   BP Readings from Last 3 Encounters:  03/12/21 132/70  01/30/21 121/68  01/23/21 113/60         Past Medical History:  Diagnosis Date   ALLERGIC RHINITIS 09/16/2007   ALLERGY 03/24/2007   Allergy    ANXIETY 03/28/2007   Arthritis    knees, back,shoulder   CAROTID ARTERY STENOSIS, BILATERAL 07/01/2009   yearly checks - no current problems   CEPHALGIA 06/08/2009   CHEST PAIN 09/16/2007   no current problems   COLONIC POLYPS, HX OF 09/16/2007   COPD (chronic obstructive pulmonary disease) ([place]) 05/12/2017   mild per patient - no inhaler   Dizziness and giddiness 06/08/2009   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   DYSPNEA/SHORTNESS OF BREATH 09/16/2007   ERECTILE DYSFUNCTION 03/28/2007   ESOPHAGEAL STRICTURE 09/16/2007   ESOPHAGITIS 03/24/2007   GERD 03/24/2007   GLUCOSE INTOLERANCE 09/16/2007   HEMORRHOIDS, INTERNAL 03/24/2007   HYPERCHOLESTEROLEMIA 03/24/2007   HYPERLIPIDEMIA 09/16/2007   HYPERTENSION 03/28/2007   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/2011   Impaired glucose tolerance 10/04/2010   LOW BACK PAIN 03/28/2007   MAGNETIC RESONANCE IMAGING, BRAIN, ABNORMAL 06/26/2009   NECK PAIN, LEFT 06/20/2010    OBESITY 03/24/2007   OTITIS MEDIA, LEFT 06/10/2009   Palpitations 06/20/2010   no current problems   PVD 06/25/2009   RASH-NONVESICULAR 06/17/2009   URI 08/16/2009   Past Surgical History:  Procedure Laterality Date   COLONOSCOPY  03/2013   polyps/[place]   NASAL SEPTOPLASTY W/ TURBINOPLASTY Bilateral 01/03/2021   Procedure: NASAL SEPTOPLASTY WITH BILATERAL TURBINATE REDUCTION;  Surgeon: Leta Baptist, MD;  Location: [facility];  Service: ENT;  Laterality: Bilateral;   s/p right knee arthroscopy     UPPER GASTROINTESTINAL ENDOSCOPY     gerd    reports that he has quit smoking. His smoking use included cigarettes. He has a 7.50 pack-year smoking history. He has never used smokeless tobacco. He reports current alcohol use of about 7.0 standard drinks per week. He reports that he does not use drugs. family history includes Coronary artery disease in his brother and sister; Heart disease in his father; Lung cancer in his sister; Lymphoma in his mother; Ovarian cancer in his sister. Allergies  Allergen Reactions   Cefuroxime Axetil Rash   Current Outpatient Medications on File Prior to Visit  Medication Sig Dispense Refill   acetaminophen (TYLENOL) 650 MG CR tablet Take 650 mg by mouth every 8 (eight) hours as needed for pain.     colchicine 0.6 MG tablet Take  two pills on day one, and then one pill bid until symptoms resolve, but do not take longer than 3 days 8 tablet 2   Cyanocobalamin (B-12 PO) Take 1 tablet by mouth daily.     diclofenac (VOLTAREN) 75 MG EC tablet Take 1 tablet (75 mg total) by mouth 2 (two) times daily as needed. 60 tablet 2   ezetimibe (ZETIA) 10 MG tablet Take 1 tablet by mouth once daily 90 tablet 3   fluticasone (FLONASE) 50 MCG/ACT nasal spray Use 2 spray(s) in each nostril once daily 16 g 0   hydrochlorothiazide (HYDRODIURIL) 25 MG tablet Take 1 tablet (25 mg total) by mouth daily. 90 tablet 3   Melatonin 10 MG TABS Take 10 mg by mouth at bedtime.       methocarbamol (ROBAXIN) 500 MG tablet Take 1 tablet (500 mg total) by mouth 2 (two) times daily as needed. 40 tablet 2   metoprolol tartrate (LOPRESSOR) 50 MG tablet Take 1 tablet by mouth twice daily 180 tablet 0   mometasone (NASONEX) 50 MCG/ACT nasal spray Place 2 sprays into the nose daily. 1 each 12   Multiple Vitamin (ONE-A-DAY 55 PLUS PO) Take 1 tablet by mouth daily.     Omega-3 Fatty Acids (FISH OIL) 1000 MG CAPS Take by mouth.     omeprazole (PRILOSEC) 20 MG capsule Take 1 capsule by mouth twice daily 180 capsule 0   predniSONE (STERAPRED UNI-PAK 21 TAB) 10 MG (21) TBPK tablet Take as directed.  Do not take any other nsaids while on this steroid 21 tablet 0   rosuvastatin (CRESTOR) 40 MG tablet Take 1 tablet by mouth once daily 90 tablet 0   sildenafil (VIAGRA) 100 MG tablet Take 1 tablet (100 mg total) by mouth as needed for erectile dysfunction. 10 tablet 11   solifenacin (VESICARE) 5 MG tablet Take 1 tablet (5 mg total) by mouth daily. 90 tablet 3   terazosin (HYTRIN) 5 MG capsule Take 5 mg by mouth at bedtime.      Testosterone 20.25 MG/1.25GM (1.62%) GEL SMARTSIG:1 Packet(s) T-DERMAL Daily     thiamine 250 MG tablet Take 250 mg by mouth daily.     tiZANidine (ZANAFLEX) 2 MG tablet Take 1 tablet (2 mg total) by mouth 2 (two) times daily as needed for muscle spasms. 30 tablet 0   traMADol (ULTRAM) 50 MG tablet Take 1 tablet (50 mg total) by mouth 3 (three) times daily as needed. 30 tablet 0   triamcinolone cream (KENALOG) 0.5 % Apply 1 application topically 3 (three) times daily. 30 g 1   Turmeric 500 MG TABS Take by mouth daily.     lisinopril (ZESTRIL) 20 MG tablet Take 1 tablet by mouth once daily 90 tablet 0   No current facility-administered medications on file prior to visit.        ROS:  All others reviewed and negative.  Objective        PE:  BP 132/70 (BP Location: Left Arm, Patient Position: Sitting, Cuff Size: Large)   Pulse (!) 57   Temp 98.1 F (36.7 C) (Oral)    Ht 5\' 9"  (1.753 m)   Wt 252 lb (114.3 kg)   SpO2 96%   BMI 37.21 kg/m                 Constitutional: Pt appears in NAD               HENT: Head: NCAT.  Right Ear: External ear normal.                 Left Ear: External ear normal.                Eyes: . Pupils are equal, round, and reactive to light. Conjunctivae and EOM are normal               Nose: without d/c or deformity               Neck: Neck supple. Gross normal ROM               Cardiovascular: Normal rate and regular rhythm.                 Pulmonary/Chest: Effort normal and breath sounds without rales or wheezing.                Abd:  Soft, NT, ND, + BS, no organomegaly               Neurological: Pt is alert. At baseline orientation, motor grossly intact               Skin: Skin is warm. No rashes, no other new lesions, LE edema - none               Psychiatric: Pt behavior is normal without agitation   Micro: none  Cardiac tracings I have personally interpreted today:  none  Pertinent Radiological findings (summarize): none   Lab Results  Component Value Date   WBC 8.0 07/05/2020   HGB 14.6 07/05/2020   HCT 43.8 07/05/2020   PLT 182.0 07/05/2020   GLUCOSE 113 (H) 03/12/2021   CHOL 152 03/12/2021   TRIG 78.0 03/12/2021   HDL 57.90 03/12/2021   LDLDIRECT 176.0 04/03/2011   LDLCALC 78 03/12/2021   ALT 64 (H) 03/12/2021   AST 51 (H) 03/12/2021   NA 143 03/12/2021   K 4.9 03/12/2021   CL 109 03/12/2021   CREATININE 1.11 03/12/2021   BUN 18 03/12/2021   CO2 27 03/12/2021   TSH 0.65 07/05/2020   PSA 0.13 07/05/2020   HGBA1C 6.6 (H) 03/12/2021   MICROALBUR <0.7 09/14/2019   Assessment/Plan:  Jahni Nazar Alkins is a 75 y.o. Black or African American [2] male with  has a past medical history of ALLERGIC RHINITIS (09/16/2007), ALLERGY (03/24/2007), Allergy, ANXIETY (03/28/2007), Arthritis, CAROTID ARTERY STENOSIS, BILATERAL (07/01/2009), CEPHALGIA (06/08/2009), CHEST PAIN (09/16/2007), COLONIC POLYPS, HX  OF (09/16/2007), COPD (chronic obstructive pulmonary disease) ([place]) (05/12/2017), Dizziness and giddiness (06/08/2009), Dyshidrotic eczema (06/03/2018), DYSPNEA/SHORTNESS OF BREATH (09/16/2007), ERECTILE DYSFUNCTION (03/28/2007), ESOPHAGEAL STRICTURE (09/16/2007), ESOPHAGITIS (03/24/2007), GERD (03/24/2007), GLUCOSE INTOLERANCE (09/16/2007), HEMORRHOIDS, INTERNAL (03/24/2007), HYPERCHOLESTEROLEMIA (03/24/2007), HYPERLIPIDEMIA (09/16/2007), HYPERTENSION (03/28/2007), Hypogonadism male (10/09/2011), Impaired glucose tolerance (10/04/2010), LOW BACK PAIN (03/28/2007), MAGNETIC RESONANCE IMAGING, BRAIN, ABNORMAL (06/26/2009), NECK PAIN, LEFT (06/20/2010), OBESITY (03/24/2007), OTITIS MEDIA, LEFT (06/10/2009), Palpitations (06/20/2010), PVD (06/25/2009), RASH-NONVESICULAR (06/17/2009), and URI (08/16/2009).  Degenerative joint disease of cervical spine Stable, to f/u ortho setp 26 as planned, may need NS if not helping   Impaired glucose tolerance . Lab Results  Component Value Date   HGBA1C 6.6 (H) 03/12/2021   Stable, pt to continue current medical treatment  - diet   Essential hypertension BP Readings from Last 3 Encounters:  03/12/21 132/70  01/30/21 121/68  01/23/21 113/60   Stable, pt to continue medical treatment hct, lisionpril, lopressor   Hyperlipidemia Lab Results  Component Value Date   LDLCALC 78  03/12/2021   Mild uncontrolled, goal ldl < 70, pt to continue current zetia, declines statin as intolerant and [facility] lipid clinic, ok for cardiac ct score  Followup: No follow-ups on file.  Cathlean Cower, MD 03/15/2021 10:16 PM [facility]

## 2021-03-12 NOTE — Telephone Encounter (Signed)
PA required for Monovisc, bilateral knee. PA submitted online through [facility]. PA pending# [id number]

## 2021-03-12 NOTE — Patient Instructions (Addendum)
You had the flu shot today  Please continue all other medications as before, and refills have been done if requested.  Please have the pharmacy call with any other refills you may need.  Please continue your efforts at being more active, low cholesterol diet, and weight control.  Please keep your appointments with your specialists as you may have planned  You will be contacted regarding the referral for: lipid clinic, and cardiac CT score  We have discussed the Cardiac CT Score test to measure the calcification level (if any) in your heart arteries.  This test has been ordered in our [place], so please call [facility] directly, as they prefer this, at [phone] to be scheduled.  Please consider the Novavax Covid Vaccine coming out hopefully next month in the pharmacies  Please go to the LAB at the blood drawing area for the tests to be done  You will be contacted by phone if any changes need to be made immediately.  Otherwise, you will receive a letter about your results with an explanation, but please check with MyChart first.  Please remember to sign up for MyChart if you have not done so, as this will be important to you in the future with finding out test results, communicating by private email, and scheduling acute appointments online when needed.  Please make an Appointment to return in 6 months, or sooner if needed

## 2021-03-15 NOTE — Assessment & Plan Note (Signed)
Lab Results  Component Value Date   LDLCALC 78 03/12/2021   Mild uncontrolled, goal ldl < 70, pt to continue current zetia, declines statin as intolerant and [facility] lipid clinic, ok for cardiac ct score

## 2021-03-15 NOTE — Assessment & Plan Note (Signed)
BP Readings from Last 3 Encounters:  03/12/21 132/70  01/30/21 121/68  01/23/21 113/60   Stable, pt to continue medical treatment hct, lisionpril, lopressor

## 2021-03-15 NOTE — Assessment & Plan Note (Signed)
.   Lab Results  Component Value Date   HGBA1C 6.6 (H) 03/12/2021   Stable, pt to continue current medical treatment  - diet

## 2021-03-17 ENCOUNTER — Ambulatory Visit: Payer: Medicare PPO | Admitting: Physical Medicine and Rehabilitation

## 2021-03-17 ENCOUNTER — Ambulatory Visit: Payer: Self-pay

## 2021-03-17 ENCOUNTER — Other Ambulatory Visit: Payer: Self-pay

## 2021-03-17 ENCOUNTER — Encounter: Payer: Self-pay | Admitting: Physical Medicine and Rehabilitation

## 2021-03-17 VITALS — BP 144/80 | HR 70

## 2021-03-17 DIAGNOSIS — M47812 Spondylosis without myelopathy or radiculopathy, cervical region: Secondary | ICD-10-CM

## 2021-03-17 MED ORDER — METHYLPREDNISOLONE ACETATE 80 MG/ML IJ SUSP
80.0000 mg | Freq: Once | INTRAMUSCULAR | Status: AC
  Administered 2021-03-17: 80 mg

## 2021-03-17 NOTE — Progress Notes (Signed)
Pt state neck pain that travels to his right shoulder. Pt state sitting straight up in a chair and bending over the pain gets worse. Pt state he takes pain meds and pain patches to help ease his pain.  Numeric Pain Rating Scale and Functional Assessment Average Pain 8   In the last MONTH (on 0-10 scale) has pain interfered with the following?  1. General activity like being  able to carry out your everyday physical activities such as walking, climbing stairs, carrying groceries, or moving a chair?  Rating(10)   +Driver, -BT, -Dye Allergies.

## 2021-03-17 NOTE — Patient Instructions (Signed)

## 2021-03-19 ENCOUNTER — Telehealth: Payer: Self-pay

## 2021-03-19 NOTE — Telephone Encounter (Signed)
PA still pending for monovisc, bilateral knee through [facility].

## 2021-03-21 NOTE — Progress Notes (Signed)
Chanson Teems Dlugosz - 75 y.o. male MRN [id number]  Date of birth: July 21, 1945  Office Visit Note: Visit Date: 03/17/2021 PCP: Biagio Borg, MD Referred by: Biagio Borg, MD  Subjective: Chief Complaint  Patient presents with   Neck - Pain   Right Shoulder - Pain   HPI:  JEREMEY BASCOM is a 75 y.o. male who comes in today For planned right C2-3 facet/medial branch block and essentially block of the lateral mass of that area.  Patient's history is well-documented he has C1-2 inflammation around the joint with arthritic changes and this is very consistent with the pain he is having in the upper neck with right-sided rotation.  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  I do think these are 2 different issues potentially.  Unfortunately we have gone over this with him a few times that we are not comfortable doing C1-2 blocks in the office do to the variation in the location of the vertebral artery.  The goal here would be to try to get some of the cortical steroid medication along the lateral mass up into the area where the inflammation is.  If it is helps him greatly that it would be diagnostic.  Consider CT-guided injection with referral either to [facility] or [facility] to have that done if possible.  I do not know anyone in the local vicinity doing these injections.  ROS Otherwise per HPI.  Assessment & Plan: Visit Diagnoses:    ICD-10-CM   1. Cervical spondylosis without myelopathy  M47.812 XR C-ARM NO REPORT    Facet Injection    methylPREDNISolone acetate (DEPO-MEDROL) injection 80 mg      Plan: No additional findings.   Meds & Orders:  Meds ordered this encounter  Medications   methylPREDNISolone acetate (DEPO-MEDROL) injection 80 mg    Orders Placed This Encounter  Procedures   Facet Injection   XR C-ARM NO REPORT    Follow-up: Return if symptoms worsen or fail to improve.   Procedures: No  procedures performed  Diagnostic Cervical Facet Joint Nerve Block with Fluoroscopic Guidance  Patient: CLAVIN RUHLMAN      Date of Birth: February 08, 1946 MRN: [id number] PCP: Biagio Borg, MD      Visit Date: 03/17/2021   Universal Protocol:    Date/Time: 09/30/226:33 PM  Consent Given By: the patient  Position: LATERAL  Additional Comments: Vital signs were monitored before and after the procedure. Patient was prepped and draped in the usual sterile fashion. The correct patient, procedure, and site was verified.   Injection Procedure Details:   Procedure diagnoses: Cervical spondylosis without myelopathy [M47.812]   Meds Administered:  Meds ordered this encounter  Medications   methylPREDNISolone acetate (DEPO-MEDROL) injection 80 mg     Laterality: Right  Location/Site:  C2-3  Needle size: 25 G  Needle type: spinal needle  Needle Placement: Articular Pillar  Findings:  -Contrast Used: 0.5 mL iohexol 180 mg iodine/mL   -Comments: Excellent flow of contrast across the articular pillars without intravascular flow  Procedure Details: The fluoroscope Lessig was manipulated to achieve the best "true" lateral view possible by squaring off the endplates with cranial and caudal tilt and using varying obliquity to achieve the a view with the longest length of spinous process.  The region overlying the facet joints mentioned above were then localized under fluoroscopic visualization.  The needle was inserted down to the center  of the "trapezoid" outline of the facet joint lateral mass. Bi-planar images were used for confirming placement and spot radiographs were documented.  A 0.25 ml volume of Omnipaque-240 was injected to look for vascular uptake. A 0.5 ml. volume of the anesthetic solution was injected onto the target. This procedure was repeated for each medial branch nerve injected.  Prior to the procedure, the patient was given a Pain Diary which was completed for baseline  measurements.  After the procedure, the patient rated their pain every 30 minutes and will continue rating at this frequency for a total of 5 hours.  The patient has been asked to complete the Diary and return to [place] by mail, fax or hand delivered as soon as possible.   Additional Comments:  The patient tolerated the procedure well No complications occurred Dressing: Band-Aid    Post-procedure details: Patient was observed during the procedure. Post-procedure instructions were reviewed.  Patient left the clinic in stable condition.      Clinical History: MRI CERVICAL SPINE WITHOUT CONTRAST   TECHNIQUE: Multiplanar, multisequence MR imaging of the cervical spine was performed. No intravenous contrast was administered.   COMPARISON:  None.   FINDINGS: Alignment: No significant listhesis.   Vertebrae: Vertebral body heights are maintained. Abnormal T1 marrow signal with STIR hyperintensity in the right C1 and C2 lateral masses. There is a C1-C2 joint effusion.   Cord: No abnormal signal.   Posterior Fossa, vertebral arteries, paraspinal tissues: Unremarkable.   Disc levels:   C2-C3:  No canal or foraminal stenosis.   C3-C4: Small central disc protrusion. No canal or foraminal stenosis.   C4-C5: Mild disc bulge with endplate osteophytes. No canal or foraminal stenosis.   C5-C6: Mild disc bulge with endplate osteophytes. Left uncovertebral hypertrophy. No canal or right foraminal stenosis. Minor left foraminal stenosis.   C6-C7: Disc bulge with endplate osteophytes. Uncovertebral hypertrophy. No canal stenosis. No right foraminal stenosis. Mild left foraminal stenosis.   C7-T1: Disc bulge slightly eccentric to the left with endplate osteophytes. No canal or foraminal stenosis.   IMPRESSION: Multilevel degenerative changes as detailed above without high-grade stenosis.   There is abnormal marrow signal involving the right C1 and C2 lateral masses with a joint  effusion. Favored to be on a degenerative basis given reported chronicity of neck pain.     Electronically Signed   By: Macy Mis M.D.   On: 12/23/2020 19:26 ___ MRI LUMBAR SPINE WITHOUT CONTRAST   TECHNIQUE: Multiplanar, multisequence MR imaging of the lumbar spine was performed. No intravenous contrast was administered.   COMPARISON: None.   FINDINGS: Segmentation: Standard.   Alignment: Physiologic.   Vertebrae: No fracture, evidence of discitis, or bone lesion.   Conus medullaris: Extends to the L2 level and appears normal.   Paraspinal and other soft tissues: Negative.   Disc levels:   L1-2: No significant disc displacement, foraminal narrowing, or canal stenosis.   L2-3: No significant disc displacement, foraminal narrowing, or canal stenosis. Mild facet hypertrophy.   L3-4: Small disc bulge with moderate bilateral facet and mild ligamentum flavum hypertrophy. Mild bilateral foraminal and lateral recess narrowing. No significant canal stenosis. Right facet effusion.   L4-5: Small disc bulge with moderate facet and ligamentum flavum hypertrophy bilaterally. Moderate bilateral foraminal and lateral recess narrowing. Mild canal stenosis.   L5-S1: Small disc bulge with mild bilateral facet and ligamentum flavum hypertrophy. Moderate bilateral foraminal narrowing. No significant canal stenosis.   IMPRESSION: 1. Lumbar spondylosis with disc and facet degenerative  changes greatest at the L4-5 level. 2. Mild L4-5 canal stenosis. No high-grade canal stenosis. 3. Foraminal narrowing is mild bilaterally at L3-4, moderate bilaterally at L4-5, and moderate bilaterally at L5-S1. 4. Right L3-4 facet effusion is likely degenerative.     Electronically Signed By: Kristine Garbe M.D. On: 12/06/2016 15:07     Objective:  VS:  HT:    WT:   BMI:     BP:(!) 144/80  HR:70bpm  TEMP: ( )  RESP:  Physical Exam Vitals and nursing note reviewed.   Constitutional:      General: He is not in acute distress.    Appearance: Normal appearance. He is not ill-appearing.  HENT:     Head: Normocephalic and atraumatic.     Right Ear: External ear normal.     Left Ear: External ear normal.  Eyes:     Extraocular Movements: Extraocular movements intact.  Cardiovascular:     Rate and Rhythm: Normal rate.     Pulses: Normal pulses.  Abdominal:     General: There is no distension.     Palpations: Abdomen is soft.  Musculoskeletal:        General: No signs of injury.     Cervical back: Neck supple. Tenderness present. No rigidity.     Right lower leg: No edema.     Left lower leg: No edema.     Comments: Patient has good strength in the upper extremities with 5 out of 5 strength in wrist extension long finger flexion APB.  No intrinsic hand muscle atrophy.  Negative Hoffmann's test.  Concordant pain with right rotation.  He does have limited range of motion to the right with rotation more than left.  Lymphadenopathy:     Cervical: No cervical adenopathy.  Skin:    Findings: No erythema or rash.  Neurological:     General: No focal deficit present.     Mental Status: He is alert and oriented to person, place, and time.     Sensory: No sensory deficit.     Motor: No weakness or abnormal muscle tone.     Coordination: Coordination normal.  Psychiatric:        Mood and Affect: Mood normal.        Behavior: Behavior normal.     Imaging: No results found.

## 2021-03-21 NOTE — Procedures (Signed)
Diagnostic Cervical Facet Joint Nerve Block with Fluoroscopic Guidance  Patient: Warren Weeks      Date of Birth: [birth year] MRN: [id number] PCP: Biagio Borg, MD      Visit Date: 03/17/2021   Universal Protocol:    Date/Time: 09/30/226:33 PM  Consent Given By: the patient  Position: LATERAL  Additional Comments: Vital signs were monitored before and after the procedure. Patient was prepped and draped in the usual sterile fashion. The correct patient, procedure, and site was verified.   Injection Procedure Details:   Procedure diagnoses: Cervical spondylosis without myelopathy [M47.812]   Meds Administered:  Meds ordered this encounter  Medications   methylPREDNISolone acetate (DEPO-MEDROL) injection 80 mg     Laterality: Right  Location/Site:  C2-3  Needle size: 25 G  Needle type: spinal needle  Needle Placement: Articular Pillar  Findings:  -Contrast Used: 0.5 mL iohexol 180 mg iodine/mL   -Comments: Excellent flow of contrast across the articular pillars without intravascular flow  Procedure Details: The fluoroscope Peach was manipulated to achieve the best "true" lateral view possible by squaring off the endplates with cranial and caudal tilt and using varying obliquity to achieve the a view with the longest length of spinous process.  The region overlying the facet joints mentioned above were then localized under fluoroscopic visualization.  The needle was inserted down to the center of the "trapezoid" outline of the facet joint lateral mass. Bi-planar images were used for confirming placement and spot radiographs were documented.  A 0.25 ml volume of Omnipaque-240 was injected to look for vascular uptake. A 0.5 ml. volume of the anesthetic solution was injected onto the target. This procedure was repeated for each medial branch nerve injected.  Prior to the procedure, the patient was given a Pain Diary which was completed for baseline measurements.  After the  procedure, the patient rated their pain every 30 minutes and will continue rating at this frequency for a total of 5 hours.  The patient has been asked to complete the Diary and return to [place] by mail, fax or hand delivered as soon as possible.   Additional Comments:  The patient tolerated the procedure well No complications occurred Dressing: Band-Aid    Post-procedure details: Patient was observed during the procedure. Post-procedure instructions were reviewed.  Patient left the clinic in stable condition.

## 2021-03-24 ENCOUNTER — Other Ambulatory Visit: Payer: Self-pay | Admitting: Internal Medicine

## 2021-03-25 ENCOUNTER — Telehealth: Payer: Self-pay

## 2021-03-25 NOTE — Telephone Encounter (Signed)
Please refill as per office routine med refill policy (all routine meds to be refilled for 3 mo or monthly (per pt preference) up to one year from last visit, then month to month grace period for 3 mo, then further med refills will have to be denied) ? ?

## 2021-03-25 NOTE — Telephone Encounter (Signed)
Called and left a Vm for patient to schedule an appointment for bilateral knee due to authorization being denied.

## 2021-04-04 ENCOUNTER — Telehealth: Payer: Self-pay | Admitting: Physical Medicine and Rehabilitation

## 2021-04-04 NOTE — Telephone Encounter (Signed)
Patient would like to cxl appointment with Dr. Ernestina Patches.

## 2021-04-08 ENCOUNTER — Other Ambulatory Visit: Payer: Self-pay

## 2021-04-08 ENCOUNTER — Ambulatory Visit: Payer: Medicare PPO | Admitting: Physical Medicine and Rehabilitation

## 2021-04-08 ENCOUNTER — Encounter: Payer: Self-pay | Admitting: Physical Medicine and Rehabilitation

## 2021-04-08 VITALS — BP 138/84 | HR 91

## 2021-04-08 DIAGNOSIS — M7918 Myalgia, other site: Secondary | ICD-10-CM

## 2021-04-08 DIAGNOSIS — M47812 Spondylosis without myelopathy or radiculopathy, cervical region: Secondary | ICD-10-CM | POA: Diagnosis not present

## 2021-04-08 DIAGNOSIS — M542 Cervicalgia: Secondary | ICD-10-CM

## 2021-04-08 MED ORDER — TRAMADOL HCL 50 MG PO TABS: 50.0000 mg | ORAL_TABLET | Freq: Two times a day (BID) | ORAL | 0 refills | Status: DC | PRN

## 2021-04-08 NOTE — Progress Notes (Signed)
Pt state neck pain that travels to both shoulder. Pt state turning or sitting still can be painful. Pt state he takes pain meds to help ease his pain.  Numeric Pain Rating Scale and Functional Assessment Average Pain 9 Pain Right Now 8 My pain is constant, sharp, and aching Pain is worse with: bending, sitting, standing, and some activites Pain improves with: medication   In the last MONTH (on 0-10 scale) has pain interfered with the following?  1. General activity like being  able to carry out your everyday physical activities such as walking, climbing stairs, carrying groceries, or moving a chair?  Rating(7)  2. Relation with others like being able to carry out your usual social activities and roles such as  activities at home, at work and in your community. Rating(8)  3. Enjoyment of life such that you have  been bothered by emotional problems such as feeling anxious, depressed or irritable?  Rating(9)

## 2021-04-08 NOTE — Progress Notes (Signed)
Warren Weeks - 75 y.o. male MRN [id number]  Date of birth: 21-Jun-1946  Office Visit Note: Visit Date: 04/08/2021 PCP: Biagio Borg, MD Referred by: Biagio Borg, MD  Subjective: Chief Complaint  Patient presents with   Neck - Pain   Right Shoulder - Pain   Left Shoulder - Pain   HPI: Warren Weeks is a 75 y.o. male who comes in today for evaluation of chronic, worsening and severe bilateral neck pain radiating to shoulders, right greater than left. Patient reports pain is exacerbated by movement and activity, describes pain as soreness and aching sensation, currently rates as 7 out of 10.  Originally referred to [place] by Dr. Eduard Roux in our office but also by his primary care physician Dr. Cathlean Cower. Patient reports some relief of pain with stretching activities, heat/ice, medications and rest. Patient reports some relief of pain with in-house physical therapy in June, however pain relief was short lived. Patient's recent cervical MRI exhibits multilevel degenerative changes, mild disc bulging, significant uncovertebral joint hypertrophy at C6-C7 and also right C1 and C2 lateral mass high signal intensity with a joint effusion. Patient had right C2-C3 facet joint/medial branch block and essentially block of the lateral mass of that area on 03/17/2021 which he reports did not help to alleviate pain.  We did choose to do the C2-3 area just to see if medication might be able to get to C1-2 without interfering with the vertebral artery which is the biggest issue with trying to do an injection at C1-2.  Patient reports he continues to have severe pain and is unable to help care for his grandchildren at his time. Patient denies focal weakness, numbness and tingling. Patient denies recent trauma or falls. Patient does use a cane to assist with ambulation.   Review of Systems  Musculoskeletal:  Positive for myalgias and neck pain.  Neurological:  Negative for tingling, sensory change, focal weakness  and weakness.  All other systems reviewed and are negative. Otherwise per HPI.  Assessment & Plan: Visit Diagnoses:    ICD-10-CM   1. Cervicalgia  M54.2 Ambulatory referral to Neurosurgery    CANCELED: Ambulatory referral to Neurosurgery    2. Cervical spondylosis without myelopathy  M47.812     3. Facet hypertrophy of cervical region  M47.812     4. Myofascial pain  M79.18        Plan: Findings:  Chronic, worsening and severe mainly right-sided axial upper neck pain but also bilateral neck pain radiating to shoulders. Patient continues to have excruciating pain despite good conservative therapies such as formal physical therapy, home exercise program, use of heat/ice and medications. Patient's pain pattern seems to be multifactorial in origin as his discomfort could be facet mediated and myofascial in nature. Patient did not get pain relief with recent C2-C3 facet joint/medial branch block. We feel the next step would be to place a referral to Dr. Erline Levine at [facility] for surgical consultation, which we did complete today. During our previous office visit and today's visit we did discuss in detail the risks associated with C1-C2 facet joint/medial branch block as this procedure would need to be performed under CT guidance as the vertebral artery does intersect the joint line at C1-C2. As we are not able to complete this injection, we did discuss possible other providers and facilities that might be able to do this type of injection under CT guidance. Patient vocalizes that he would like to consult  with surgeon before proceeding with injection.  Patient encouraged to remain active as tolerated and to continue using cane. No red flag symptoms noted upon exam today.    Meds & Orders:  Meds ordered this encounter  Medications   traMADol (ULTRAM) 50 MG tablet    Sig: Take 1 tablet (50 mg total) by mouth 2 (two) times daily as needed for moderate pain or severe pain.    Dispense:   14 tablet    Refill:  0    Order Specific Question:   Supervising Provider    Answer:   Magnus Sinning [[id number]]    Orders Placed This Encounter  Procedures   Ambulatory referral to Neurosurgery    Follow-up: Return if symptoms worsen or fail to improve.   Procedures: No procedures performed      Clinical History: MRI CERVICAL SPINE WITHOUT CONTRAST   TECHNIQUE: Multiplanar, multisequence MR imaging of the cervical spine was performed. No intravenous contrast was administered.   COMPARISON:  None.   FINDINGS: Alignment: No significant listhesis.   Vertebrae: Vertebral body heights are maintained. Abnormal T1 marrow signal with STIR hyperintensity in the right C1 and C2 lateral masses. There is a C1-C2 joint effusion.   Cord: No abnormal signal.   Posterior Fossa, vertebral arteries, paraspinal tissues: Unremarkable.   Disc levels:   C2-C3:  No canal or foraminal stenosis.   C3-C4: Small central disc protrusion. No canal or foraminal stenosis.   C4-C5: Mild disc bulge with endplate osteophytes. No canal or foraminal stenosis.   C5-C6: Mild disc bulge with endplate osteophytes. Left uncovertebral hypertrophy. No canal or right foraminal stenosis. Minor left foraminal stenosis.   C6-C7: Disc bulge with endplate osteophytes. Uncovertebral hypertrophy. No canal stenosis. No right foraminal stenosis. Mild left foraminal stenosis.   C7-T1: Disc bulge slightly eccentric to the left with endplate osteophytes. No canal or foraminal stenosis.   IMPRESSION: Multilevel degenerative changes as detailed above without high-grade stenosis.   There is abnormal marrow signal involving the right C1 and C2 lateral masses with a joint effusion. Favored to be on a degenerative basis given reported chronicity of neck pain.     Electronically Signed   By: Macy Mis M.D.   On: 12/23/2020 19:26 ___ MRI LUMBAR SPINE WITHOUT CONTRAST   TECHNIQUE: Multiplanar,  multisequence MR imaging of the lumbar spine was performed. No intravenous contrast was administered.   COMPARISON: None.   FINDINGS: Segmentation: Standard.   Alignment: Physiologic.   Vertebrae: No fracture, evidence of discitis, or bone lesion.   Conus medullaris: Extends to the L2 level and appears normal.   Paraspinal and other soft tissues: Negative.   Disc levels:   L1-2: No significant disc displacement, foraminal narrowing, or canal stenosis.   L2-3: No significant disc displacement, foraminal narrowing, or canal stenosis. Mild facet hypertrophy.   L3-4: Small disc bulge with moderate bilateral facet and mild ligamentum flavum hypertrophy. Mild bilateral foraminal and lateral recess narrowing. No significant canal stenosis. Right facet effusion.   L4-5: Small disc bulge with moderate facet and ligamentum flavum hypertrophy bilaterally. Moderate bilateral foraminal and lateral recess narrowing. Mild canal stenosis.   L5-S1: Small disc bulge with mild bilateral facet and ligamentum flavum hypertrophy. Moderate bilateral foraminal narrowing. No significant canal stenosis.   IMPRESSION: 1. 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 2. Mild L4-5 canal stenosis. No high-grade canal stenosis. 3. Foraminal narrowing is mild bilaterally at L3-4, moderate bilaterally at L4-5, and moderate  bilaterally at L5-S1. 4. Right L3-4 facet effusion is likely degenerative.     Electronically Signed By: Kristine Garbe M.D. On: 12/06/2016 15:07   He reports that he has quit smoking. His smoking use included cigarettes. He has a 7.50 pack-year smoking history. He has never used smokeless tobacco.  Recent Labs    07/03/20 1140 07/05/20 1043 03/12/21 1108  HGBA1C  --  6.3 6.6*  LABURIC 5.2 4.5  --     Objective:  VS:  HT:    WT:   BMI:     BP:138/84  HR:91bpm  TEMP: ( )  RESP:  Physical Exam Vitals and nursing  note reviewed.  HENT:     Head: Normocephalic.     Right Ear: External ear normal.     Left Ear: External ear normal.     Nose: Nose normal.     Mouth/Throat:     Mouth: Mucous membranes are moist.  Eyes:     Extraocular Movements: Extraocular movements intact.  Cardiovascular:     Rate and Rhythm: Normal rate.     Pulses: Normal pulses.  Pulmonary:     Effort: Pulmonary effort is normal.  Abdominal:     General: Abdomen is flat. There is no distension.  Musculoskeletal:        General: Tenderness present.     Cervical back: Tenderness present.     Comments: Discomfort noted with flexion, extension and side-to-side rotation. Good strength noted to bilateral upper extremities. Sensation intact bilaterally. Tenderness noted upon palpation of bilateral trapezius muscles. Negative Hoffman's sign.    Skin:    General: Skin is warm and dry.     Capillary Refill: Capillary refill takes less than 2 seconds.  Neurological:     General: No focal deficit present.     Mental Status: He is alert.     Gait: Gait abnormal.  Psychiatric:        Mood and Affect: Mood normal.    Ortho Exam  Imaging: No results found.  Past Medical/Family/Surgical/Social History: Medications & Allergies reviewed per EMR, new medications updated. Patient Active Problem List   Diagnosis Date Noted   Degenerative joint disease of cervical spine 03/12/2021   Acute sinus infection 12/25/2020   Iliotibial band syndrome affecting left lower leg 09/08/2020   Neck pain on right side 09/05/2020   OAB (overactive bladder) 07/05/2020   Pain and swelling of right lower leg 07/05/2020   Cervicalgia 07/05/2020   Right knee pain 07/05/2020   PVC's (premature ventricular contractions) 03/02/2020   BPH (benign prostatic hyperplasia) 03/02/2020   Arthritis 16/03/9603   Diastolic dysfunction 54/02/8118   Urinary frequency 09/14/2019   Hoarseness of voice 12/07/2018   Urinary leakage 12/07/2018   Left knee pain  12/07/2018   Chronic sinusitis 10/05/2018   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   COPD (chronic obstructive pulmonary disease) ([place]) 05/12/2017   Pleurisy 03/16/2017   Bilateral leg pain 11/22/2016   Insomnia 10/13/2016   Cough 07/22/2016   Wheezing 07/22/2016   Peripheral edema 02/21/2016   Venous insufficiency 02/05/2016   Low back pain radiating to lower extremity 02/05/2016   Vertigo    Hypersomnolence 10/24/2015   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[place]) 12/14/2014   Chest pain 01/18/2013   Hematochezia 10/12/2012   Viral illness 05/26/2012   Palpitations 01/05/2012   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/2011   Fatigue 04/09/2011   Rash 12/22/2010   Encounter for long-term (current) use of high-risk medication 10/08/2010   Impaired glucose tolerance 10/04/2010   Encounter for  well adult exam with abnormal findings 10/04/2010   Carotid artery stenosis 07/01/2009   PVD 06/25/2009   Allergic rhinitis 09/16/2007   ESOPHAGEAL STRICTURE 09/16/2007   COLONIC POLYPS, HX OF 09/16/2007   Anxiety state 03/28/2007   ERECTILE DYSFUNCTION 03/28/2007   Hyperlipidemia 03/28/2007   Essential hypertension 03/28/2007   LOW BACK PAIN 03/28/2007   OBESITY 03/24/2007   HEMORRHOIDS, INTERNAL 03/24/2007   ESOPHAGITIS 03/24/2007   GERD 03/24/2007   Past Medical History:  Diagnosis Date   ALLERGIC RHINITIS 09/16/2007   ALLERGY 03/24/2007   Allergy    ANXIETY 03/28/2007   Arthritis    knees, back,shoulder   CAROTID ARTERY STENOSIS, BILATERAL 07/01/2009   yearly checks - no current problems   CEPHALGIA 06/08/2009   CHEST PAIN 09/16/2007   no current problems   COLONIC POLYPS, HX OF 09/16/2007   COPD (chronic obstructive pulmonary disease) ([place]) 05/12/2017   mild per patient - no inhaler   Dizziness and giddiness 06/08/2009   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   DYSPNEA/SHORTNESS OF BREATH 09/16/2007   ERECTILE DYSFUNCTION 03/28/2007   ESOPHAGEAL STRICTURE 09/16/2007   ESOPHAGITIS 03/24/2007   GERD 03/24/2007   GLUCOSE  INTOLERANCE 09/16/2007   HEMORRHOIDS, INTERNAL 03/24/2007   HYPERCHOLESTEROLEMIA 03/24/2007   HYPERLIPIDEMIA 09/16/2007   HYPERTENSION 03/28/2007   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/2011   Impaired glucose tolerance 10/04/2010   LOW BACK PAIN 03/28/2007   MAGNETIC RESONANCE IMAGING, BRAIN, ABNORMAL 06/26/2009   NECK PAIN, LEFT 06/20/2010   OBESITY 03/24/2007   OTITIS MEDIA, LEFT 06/10/2009   Palpitations 06/20/2010   no current problems   PVD 06/25/2009   RASH-NONVESICULAR 06/17/2009   URI 08/16/2009   Family History  Problem Relation Age of Onset   Lymphoma Mother    Heart disease Father        CHF   Ovarian cancer Sister    Lung cancer Sister    Coronary artery disease Sister        CABG   Coronary artery disease Brother        stent, and CAD   Colon cancer Neg Hx    Esophageal cancer Neg Hx    Rectal cancer Neg Hx    Stomach cancer Neg Hx    Colon polyps Neg Hx    Past Surgical History:  Procedure Laterality Date   COLONOSCOPY  03/2013   polyps/[place]   NASAL SEPTOPLASTY W/ TURBINOPLASTY Bilateral 01/03/2021   Procedure: NASAL SEPTOPLASTY WITH BILATERAL TURBINATE REDUCTION;  Surgeon: Leta Baptist, MD;  Location: [place];  Service: ENT;  Laterality: Bilateral;   s/p right knee arthroscopy     UPPER GASTROINTESTINAL ENDOSCOPY     gerd   Social History   Occupational History   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

## 2021-04-10 ENCOUNTER — Encounter: Payer: Self-pay | Admitting: Internal Medicine

## 2021-04-10 ENCOUNTER — Ambulatory Visit: Payer: Medicare PPO | Admitting: Internal Medicine

## 2021-04-10 ENCOUNTER — Other Ambulatory Visit: Payer: Self-pay

## 2021-04-10 VITALS — BP 134/60 | HR 69 | Ht 70.0 in | Wt 253.0 lb

## 2021-04-10 DIAGNOSIS — Z7689 Persons encountering health services in other specified circumstances: Secondary | ICD-10-CM

## 2021-04-10 NOTE — Patient Instructions (Signed)
Medication Instructions:  Your physician recommends that you continue on your current medications as directed. Please refer to the Current Medication list given to you today.  Follow-Up: At [facility], you and your health needs are our priority.  As part of our continuing mission to provide you with exceptional heart care, we have created designated Provider Care Teams.  These Care Teams include your primary Cardiologist (physician) and Advanced Practice Providers (APPs -  Physician Assistants and Nurse Practitioners) who all work together to provide you with the care you need, when you need it.  We recommend signing up for the patient portal called "MyChart".  Sign up information is provided on this After Visit Summary.  MyChart is used to connect with patients for Virtual Visits (Telemedicine).  Patients are able to view lab/test results, encounter notes, upcoming appointments, etc.  Non-urgent messages can be sent to your provider as well.   To learn more about what you can do with MyChart, go to [email or web address].    Your next appointment:   AS NEEDED with Dr. Harl Bowie

## 2021-04-10 NOTE — Progress Notes (Signed)
Cardiology Office Note:    Date:  04/10/2021   ID:  Warren Weeks, DOB September 05, 1945, MRN [id number]  PCP:  Warren Borg, MD   [facility] Providers Cardiologist:  None     Referring MD: Warren Borg, MD   No chief complaint on file. Referred from PCP for hyperlipidemia  History of Present Illness:    Warren Weeks is a 75 y.o. male with a hx of BL carotid stenosis not significant, HTN, HLD LDL 78   Warren Weeks comes in to discuss cholesterol levels and risk as a referral from his PCP. He was told LDL was high and to consider lipid clinic and a CAC score was ordered.  He denies angina, dyspnea on exertion, lower extremity edema, PND or orthopnea.  He feels infrequent palpitations. No syncope  Cardiac Hx: none. No history of stroke  Family Hx: Father had CHF at 29. Sister had CABG. Sister passed 41  Social Hx: quit 30 years ago. 20 years smoking.  Past Medical History:  Diagnosis Date   ALLERGIC RHINITIS 09/16/2007   ALLERGY 03/24/2007   Allergy    ANXIETY 03/28/2007   Arthritis    knees, back,shoulder   CAROTID ARTERY STENOSIS, BILATERAL 07/01/2009   yearly checks - no current problems   CEPHALGIA 06/08/2009   CHEST PAIN 09/16/2007   no current problems   COLONIC POLYPS, HX OF 09/16/2007   COPD (chronic obstructive pulmonary disease) ([place]) 05/12/2017   mild per patient - no inhaler   Dizziness and giddiness 06/08/2009   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   DYSPNEA/SHORTNESS OF BREATH 09/16/2007   ERECTILE DYSFUNCTION 03/28/2007   ESOPHAGEAL STRICTURE 09/16/2007   ESOPHAGITIS 03/24/2007   GERD 03/24/2007   GLUCOSE INTOLERANCE 09/16/2007   HEMORRHOIDS, INTERNAL 03/24/2007   HYPERCHOLESTEROLEMIA 03/24/2007   HYPERLIPIDEMIA 09/16/2007   HYPERTENSION 03/28/2007   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/2011   Impaired glucose tolerance 10/04/2010   LOW BACK PAIN 03/28/2007   MAGNETIC RESONANCE IMAGING, BRAIN, ABNORMAL 06/26/2009   NECK PAIN, LEFT 06/20/2010   OBESITY 03/24/2007   OTITIS MEDIA, LEFT  06/10/2009   Palpitations 06/20/2010   no current problems   PVD 06/25/2009   RASH-NONVESICULAR 06/17/2009   URI 08/16/2009    Past Surgical History:  Procedure Laterality Date   COLONOSCOPY  03/2013   polyps/[place]   NASAL SEPTOPLASTY W/ TURBINOPLASTY Bilateral 01/03/2021   Procedure: NASAL SEPTOPLASTY WITH BILATERAL TURBINATE REDUCTION;  Surgeon: Warren Baptist, MD;  Location: [place];  Service: ENT;  Laterality: Bilateral;   s/p right knee arthroscopy     UPPER GASTROINTESTINAL ENDOSCOPY     gerd    Current Medications: No outpatient medications have been marked as taking for the 04/10/21 encounter (Appointment) with Warren Mayo, MD.     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: [employer]  Tobacco Use   Smoking status: Former    Packs/day: 0.50    Years: 15.00    Pack years: 7.50    Types: Cigarettes   Smokeless tobacco: Never   Tobacco comments:    Quit 30 years ago  Vaping Use   Vaping Use: Never used  Substance and Sexual Activity   Alcohol use:  Yes    Alcohol/week: 7.0 standard drinks    Types: 7 Cans of beer per week    Comment: beer most days   Drug use: No   Sexual activity: Yes  Other Topics Concern   Not on file  Social History Narrative   Not on file   Social Determinants of Health   Financial Resource Strain: Low Risk    Difficulty of Paying Living Expenses: Not hard at all  Food Insecurity: No Food Insecurity   Worried About Charity fundraiser in the Last Year: Never true   [place] in the Last Year: Never true  Transportation Needs: No Transportation Needs   Lack of Transportation (Medical): No   Lack of Transportation (Non-Medical): No  Physical Activity: Not on  file  Stress: No Stress Concern Present   Feeling of Stress : Not at all  Social Connections: Socially Integrated   Frequency of Communication with Friends and Family: More than three times a week   Frequency of Social Gatherings with Friends and Family: More than three times a week   Attends Religious Services: More than 4 times per year   Active Member of [employer] or Organizations: No   Attends Music therapist: More than 4 times per year   Marital Status: Married     Family History: The patient's family history includes Coronary artery disease in his brother and sister; Heart disease in his father; Lung cancer in his sister; Lymphoma in his mother; Ovarian cancer in his sister. There is no history of Colon cancer, Esophageal cancer, Rectal cancer, Stomach cancer, or Colon polyps.  ROS:   Please see the history of present illness.     All other systems reviewed and are negative.  EKGs/Labs/Other Studies Reviewed:    The following studies were reviewed today:   EKG:  EKG is  ordered today.  The ekg ordered today demonstrates   NSR Normal EKG  Recent Labs: 07/05/2020: Hemoglobin 14.6; Platelets 182.0; TSH 0.65 03/12/2021: ALT 64; BUN 18; Creatinine, Ser 1.11; Potassium 4.9; Sodium 143  Recent Lipid Panel    Component Value Date/Time   CHOL 152 03/12/2021 1108   TRIG 78.0 03/12/2021 1108   HDL 57.90 03/12/2021 1108   CHOLHDL 3 03/12/2021 1108   VLDL 15.6 03/12/2021 1108   LDLCALC 78 03/12/2021 1108   LDLDIRECT 176.0 04/03/2011 0803     Risk Assessment/Calculations:     The 10-year ASCVD risk score (Arnett DK, et al., 2019) is: 19.5%   Values used to calculate the score:     Age: 60 years     Sex: Male     Is Non-Hispanic African American: Yes     Diabetic: No     Tobacco smoker: No     Systolic Blood Pressure: 099 mmHg     Is BP treated: Yes     HDL Cholesterol: 57.9 mg/dL     Total Cholesterol: 152 mg/dL       Physical Exam:    VS:  There were no  vitals taken for this visit.    Wt Readings from Last 3 Encounters:  03/12/21 252 lb (114.3 kg)  01/21/21 249 lb (112.9 kg)  01/03/21 249 lb 1.9 oz (113 kg)     GEN:  Well nourished, well developed in no acute distress HEENT: Normal NECK: No JVD; No carotid bruits CARDIAC: RRR, no murmurs, rubs, gallops Vasc: 2+ BL radial RESPIRATORY:  Clear to auscultation without rales, wheezing or rhonchi  ABDOMEN: Soft, non-tender, non-distended MUSCULOSKELETAL:  No edema; No deformity  SKIN: Warm and dry NEUROLOGIC:  Alert and oriented x 3 PSYCHIATRIC:  Normal affect   ASSESSMENT:    #Hyperlipidemia: His LDL is 78 and he is intermediate risk with his ASCVD. He is tolerating statin and zetia well. Considering he is not very high risk and has no CVD/stroke  history, I do not feel strongly about aggressive LDL management < 70. Otherwise, he is asymptomatic.   PLAN:    In order of problems listed above:  PRN follow-up      Medication Adjustments/Labs and Tests Ordered: Current medicines are reviewed at length with the patient today.  Concerns regarding medicines are outlined above.    Signed, Warren Mayo, MD  04/10/2021 7:13 AM    [place]

## 2021-04-15 ENCOUNTER — Other Ambulatory Visit: Payer: Self-pay | Admitting: Internal Medicine

## 2021-04-15 NOTE — Telephone Encounter (Signed)
Please refill as per office routine med refill policy (all routine meds to be refilled for 3 mo or monthly (per pt preference) up to one year from last visit, then month to month grace period for 3 mo, then further med refills will have to be denied) ? ?

## 2021-04-30 DIAGNOSIS — M47812 Spondylosis without myelopathy or radiculopathy, cervical region: Secondary | ICD-10-CM | POA: Diagnosis not present

## 2021-04-30 DIAGNOSIS — M542 Cervicalgia: Secondary | ICD-10-CM | POA: Diagnosis not present

## 2021-04-30 DIAGNOSIS — Z6837 Body mass index (BMI) 37.0-37.9, adult: Secondary | ICD-10-CM | POA: Diagnosis not present

## 2021-04-30 DIAGNOSIS — M5030 Other cervical disc degeneration, unspecified cervical region: Secondary | ICD-10-CM | POA: Diagnosis not present

## 2021-04-30 DIAGNOSIS — I10 Essential (primary) hypertension: Secondary | ICD-10-CM | POA: Diagnosis not present

## 2021-04-30 DIAGNOSIS — M47819 Spondylosis without myelopathy or radiculopathy, site unspecified: Secondary | ICD-10-CM | POA: Diagnosis not present

## 2021-05-06 ENCOUNTER — Other Ambulatory Visit: Payer: Self-pay | Admitting: Internal Medicine

## 2021-05-06 NOTE — Telephone Encounter (Signed)
Please refill as per office routine med refill policy (all routine meds to be refilled for 3 mo or monthly (per pt preference) up to one year from last visit, then month to month grace period for 3 mo, then further med refills will have to be denied) ? ?

## 2021-05-22 ENCOUNTER — Other Ambulatory Visit: Payer: Self-pay | Admitting: Internal Medicine

## 2021-05-22 NOTE — Telephone Encounter (Signed)
Please refill as per office routine med refill policy (all routine meds to be refilled for 3 mo or monthly (per pt preference) up to one year from last visit, then month to month grace period for 3 mo, then further med refills will have to be denied) ? ?

## 2021-05-27 DIAGNOSIS — M47812 Spondylosis without myelopathy or radiculopathy, cervical region: Secondary | ICD-10-CM | POA: Diagnosis not present

## 2021-06-02 ENCOUNTER — Other Ambulatory Visit: Payer: Self-pay | Admitting: Internal Medicine

## 2021-06-02 NOTE — Telephone Encounter (Signed)
Please refill as per office routine med refill policy (all routine meds to be refilled for 3 mo or monthly (per pt preference) up to one year from last visit, then month to month grace period for 3 mo, then further med refills will have to be denied) ? ?

## 2021-06-29 ENCOUNTER — Other Ambulatory Visit: Payer: Self-pay | Admitting: Internal Medicine

## 2021-06-29 NOTE — Telephone Encounter (Signed)
Please refill as per office routine med refill policy (all routine meds to be refilled for 3 mo or monthly (per pt preference) up to one year from last visit, then month to month grace period for 3 mo, then further med refills will have to be denied) ? ?

## 2021-07-02 DIAGNOSIS — M47812 Spondylosis without myelopathy or radiculopathy, cervical region: Secondary | ICD-10-CM | POA: Diagnosis not present

## 2021-07-21 DIAGNOSIS — M542 Cervicalgia: Secondary | ICD-10-CM | POA: Diagnosis not present

## 2021-07-28 DIAGNOSIS — M542 Cervicalgia: Secondary | ICD-10-CM | POA: Diagnosis not present

## 2021-07-29 DIAGNOSIS — M47812 Spondylosis without myelopathy or radiculopathy, cervical region: Secondary | ICD-10-CM | POA: Diagnosis not present

## 2021-07-30 DIAGNOSIS — J343 Hypertrophy of nasal turbinates: Secondary | ICD-10-CM | POA: Diagnosis not present

## 2021-07-30 DIAGNOSIS — J310 Chronic rhinitis: Secondary | ICD-10-CM | POA: Diagnosis not present

## 2021-08-01 DIAGNOSIS — M542 Cervicalgia: Secondary | ICD-10-CM | POA: Diagnosis not present

## 2021-08-04 DIAGNOSIS — M542 Cervicalgia: Secondary | ICD-10-CM | POA: Diagnosis not present

## 2021-08-05 ENCOUNTER — Ambulatory Visit: Payer: Medicare PPO

## 2021-08-07 DIAGNOSIS — E291 Testicular hypofunction: Secondary | ICD-10-CM | POA: Diagnosis not present

## 2021-08-07 DIAGNOSIS — R948 Abnormal results of function studies of other organs and systems: Secondary | ICD-10-CM | POA: Diagnosis not present

## 2021-08-08 DIAGNOSIS — M542 Cervicalgia: Secondary | ICD-10-CM | POA: Diagnosis not present

## 2021-08-10 DIAGNOSIS — N3281 Overactive bladder: Secondary | ICD-10-CM | POA: Diagnosis not present

## 2021-08-10 DIAGNOSIS — M545 Low back pain, unspecified: Secondary | ICD-10-CM | POA: Diagnosis not present

## 2021-08-10 DIAGNOSIS — G8929 Other chronic pain: Secondary | ICD-10-CM | POA: Diagnosis not present

## 2021-08-10 DIAGNOSIS — I10 Essential (primary) hypertension: Secondary | ICD-10-CM | POA: Diagnosis not present

## 2021-08-10 DIAGNOSIS — K219 Gastro-esophageal reflux disease without esophagitis: Secondary | ICD-10-CM | POA: Diagnosis not present

## 2021-08-10 DIAGNOSIS — M1990 Unspecified osteoarthritis, unspecified site: Secondary | ICD-10-CM | POA: Diagnosis not present

## 2021-08-10 DIAGNOSIS — E785 Hyperlipidemia, unspecified: Secondary | ICD-10-CM | POA: Diagnosis not present

## 2021-08-10 DIAGNOSIS — E291 Testicular hypofunction: Secondary | ICD-10-CM | POA: Diagnosis not present

## 2021-08-11 ENCOUNTER — Other Ambulatory Visit: Payer: Self-pay

## 2021-08-11 ENCOUNTER — Encounter: Payer: Self-pay | Admitting: Internal Medicine

## 2021-08-11 ENCOUNTER — Ambulatory Visit: Payer: Medicare PPO | Admitting: Internal Medicine

## 2021-08-11 VITALS — BP 126/60 | HR 69 | Temp 98.2°F | Ht 70.0 in | Wt 252.0 lb

## 2021-08-11 DIAGNOSIS — I10 Essential (primary) hypertension: Secondary | ICD-10-CM

## 2021-08-11 DIAGNOSIS — E7800 Pure hypercholesterolemia, unspecified: Secondary | ICD-10-CM | POA: Diagnosis not present

## 2021-08-11 DIAGNOSIS — J019 Acute sinusitis, unspecified: Secondary | ICD-10-CM

## 2021-08-11 DIAGNOSIS — J309 Allergic rhinitis, unspecified: Secondary | ICD-10-CM

## 2021-08-11 DIAGNOSIS — R7302 Impaired glucose tolerance (oral): Secondary | ICD-10-CM | POA: Diagnosis not present

## 2021-08-11 DIAGNOSIS — I872 Venous insufficiency (chronic) (peripheral): Secondary | ICD-10-CM

## 2021-08-11 MED ORDER — METHYLPREDNISOLONE ACETATE 40 MG/ML IJ SUSP
80.0000 mg | Freq: Once | INTRAMUSCULAR | Status: AC
  Administered 2021-08-11: 80 mg via INTRAMUSCULAR

## 2021-08-11 MED ORDER — PREDNISONE 10 MG PO TABS: ORAL_TABLET | ORAL | 0 refills | Status: DC

## 2021-08-11 MED ORDER — DOXYCYCLINE HYCLATE 100 MG PO TABS: 100.0000 mg | ORAL_TABLET | Freq: Two times a day (BID) | ORAL | 0 refills | Status: DC

## 2021-08-11 NOTE — Progress Notes (Signed)
Patient ID: Warren Weeks, male   DOB: 02/25/1946, 76 y.o.   MRN: [id number]        Chief Complaint: follow up sinus symptoms, allergies, hlld, venous insufficiency       HPI:  Warren Weeks is a 76 y.o. male  Here with 2-3 days acute onset fever, facial pain, pressure, headache, general weakness and malaise, and greenish d/c, with mild ST and cough, but pt denies chest pain, wheezing, increased sob or doe, orthopnea, PND, increased LE swelling, palpitations, dizziness or syncope.  Does have several wks ongoing nasal allergy symptoms with clearish congestion, itch and sneezing, without fever, pain, ST, cough, swelling or wheezing.  Still interested in Card CT, ws not able to schedule yet.  Trying to follow lower chol diet.  Still with venous insufficiency swelling, has not tried compression stockings yet.  Worse swelling in the PM, better in the AM.           Wt Readings from Last 3 Encounters:  08/11/21 252 lb (114.3 kg)  04/10/21 253 lb (114.8 kg)  03/12/21 252 lb (114.3 kg)   BP Readings from Last 3 Encounters:  08/11/21 126/60  04/10/21 134/60  04/08/21 138/84         Past Medical History:  Diagnosis Date   ALLERGIC RHINITIS 09/16/2007   ALLERGY 03/24/2007   Allergy    ANXIETY 03/28/2007   Arthritis    knees, back,shoulder   CAROTID ARTERY STENOSIS, BILATERAL 07/01/2009   yearly checks - no current problems   CEPHALGIA 06/08/2009   CHEST PAIN 09/16/2007   no current problems   COLONIC POLYPS, HX OF 09/16/2007   COPD (chronic obstructive pulmonary disease) ([place]) 05/12/2017   mild per patient - no inhaler   Dizziness and giddiness 06/08/2009   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   DYSPNEA/SHORTNESS OF BREATH 09/16/2007   ERECTILE DYSFUNCTION 03/28/2007   ESOPHAGEAL STRICTURE 09/16/2007   ESOPHAGITIS 03/24/2007   GERD 03/24/2007   GLUCOSE INTOLERANCE 09/16/2007   HEMORRHOIDS, INTERNAL 03/24/2007   HYPERCHOLESTEROLEMIA 03/24/2007   HYPERLIPIDEMIA 09/16/2007   HYPERTENSION 03/28/2007    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/2011   Impaired glucose tolerance 10/04/2010   LOW BACK PAIN 03/28/2007   MAGNETIC RESONANCE IMAGING, BRAIN, ABNORMAL 06/26/2009   NECK PAIN, LEFT 06/20/2010   OBESITY 03/24/2007   OTITIS MEDIA, LEFT 06/10/2009   Palpitations 06/20/2010   no current problems   PVD 06/25/2009   RASH-NONVESICULAR 06/17/2009   URI 08/16/2009   Past Surgical History:  Procedure Laterality Date   COLONOSCOPY  03/2013   polyps/[place]   NASAL SEPTOPLASTY W/ TURBINOPLASTY Bilateral 01/03/2021   Procedure: NASAL SEPTOPLASTY WITH BILATERAL TURBINATE REDUCTION;  Surgeon: Leta Baptist, MD;  Location: [facility];  Service: ENT;  Laterality: Bilateral;   s/p right knee arthroscopy     UPPER GASTROINTESTINAL ENDOSCOPY     gerd    reports that he has quit smoking. His smoking use included cigarettes. He has a 7.50 pack-year smoking history. He has never used smokeless tobacco. He reports current alcohol use of about 7.0 standard drinks per week. He reports that he does not use drugs. family history includes Coronary artery disease in his brother and sister; Heart disease in his father; Lung cancer in his sister; Lymphoma in his mother; Ovarian cancer in his sister. Allergies  Allergen Reactions   Cefuroxime Axetil Rash   Current Outpatient Medications on File Prior to Visit  Medication Sig Dispense Refill   acetaminophen (TYLENOL) 650 MG CR tablet Take 650 mg  by mouth every 8 (eight) hours as needed for pain.     Cyanocobalamin (B-12 PO) Take 1 tablet by mouth daily.     diclofenac (VOLTAREN) 75 MG EC tablet Take 1 tablet (75 mg total) by mouth 2 (two) times daily as needed. 60 tablet 2   ezetimibe (ZETIA) 10 MG tablet Take 1 tablet by mouth once daily 90 tablet 3   hydrochlorothiazide (HYDRODIURIL) 25 MG tablet Take 1 tablet (25 mg total) by mouth daily. (Patient taking differently: Take 25 mg by mouth daily as needed.) 90 tablet 3   lisinopril (ZESTRIL) 20 MG tablet Take 1 tablet by  mouth once daily 90 tablet 3   Melatonin 10 MG TABS Take 10 mg by mouth at bedtime.      methocarbamol (ROBAXIN) 500 MG tablet Take 1 tablet (500 mg total) by mouth 2 (two) times daily as needed. 40 tablet 2   metoprolol tartrate (LOPRESSOR) 50 MG tablet Take 1 tablet by mouth twice daily 180 tablet 0   mometasone (NASONEX) 50 MCG/ACT nasal spray Place 2 sprays into the nose daily. 1 each 12   Multiple Vitamin (ONE-A-DAY 55 PLUS PO) Take 1 tablet by mouth daily.     Omega-3 Fatty Acids (FISH OIL) 1000 MG CAPS Take by mouth.     omeprazole (PRILOSEC) 20 MG capsule Take 1 capsule by mouth twice daily 180 capsule 0   rosuvastatin (CRESTOR) 40 MG tablet Take 1 tablet by mouth once daily 90 tablet 0   sildenafil (VIAGRA) 100 MG tablet Take 1 tablet (100 mg total) by mouth as needed for erectile dysfunction. 10 tablet 11   solifenacin (VESICARE) 5 MG tablet Take 1 tablet (5 mg total) by mouth daily. 90 tablet 3   terazosin (HYTRIN) 5 MG capsule Take 5 mg by mouth at bedtime.      Testosterone 20.25 MG/1.25GM (1.62%) GEL SMARTSIG:1 Packet(s) T-DERMAL Daily     thiamine 250 MG tablet Take 250 mg by mouth daily.     traMADol (ULTRAM) 50 MG tablet Take 1 tablet (50 mg total) by mouth 2 (two) times daily as needed for moderate pain or severe pain. 14 tablet 0   triamcinolone cream (KENALOG) 0.5 % Apply 1 application topically 3 (three) times daily. 30 g 1   Turmeric 500 MG TABS Take by mouth daily.     colchicine 0.6 MG tablet Take two pills on day one, and then one pill bid until symptoms resolve, but do not take longer than 3 days (Patient not taking: Reported on 04/10/2021) 8 tablet 2   fluticasone (FLONASE) 50 MCG/ACT nasal spray Use 2 spray(s) in each nostril once daily (Patient not taking: Reported on 04/10/2021) 16 g 0   tiZANidine (ZANAFLEX) 2 MG tablet Take 1 tablet (2 mg total) by mouth 2 (two) times daily as needed for muscle spasms. (Patient not taking: Reported on 04/10/2021) 30 tablet 0   No  current facility-administered medications on file prior to visit.        ROS:  All others reviewed and negative.  Objective        PE:  BP 126/60 (BP Location: Right Arm, Patient Position: Sitting, Cuff Size: Large)    Pulse 69    Temp 98.2 F (36.8 C) (Oral)    Ht 5\' 10"  (1.778 m)    Wt 252 lb (114.3 kg)    SpO2 96%    BMI 36.16 kg/m  Constitutional: Pt appears in NAD               HENT: Head: NCAT.                Right Ear: External ear normal.                 Left Ear: External ear normal.  Bilat tm's with mild erythema.  Max sinus areas mild tender.  Pharynx with mild erythema, no exudate               Eyes: . Pupils are equal, round, and reactive to light. Conjunctivae and EOM are normal               Nose: without d/c or deformity               Neck: Neck supple. Gross normal ROM               Cardiovascular: Normal rate and regular rhythm.                 Pulmonary/Chest: Effort normal and breath sounds without rales or wheezing.                Abd:  Soft, NT, ND, + BS, no organomegaly               Neurological: Pt is alert. At baseline orientation, motor grossly intact               Skin: Skin is warm. No rashes, no other new lesions, LE edema - 1+ bilateral left > right               Psychiatric: Pt behavior is normal without agitation   Micro: none  Cardiac tracings I have personally interpreted today:  none  Pertinent Radiological findings (summarize): none   Lab Results  Component Value Date   WBC 8.0 07/05/2020   HGB 14.6 07/05/2020   HCT 43.8 07/05/2020   PLT 182.0 07/05/2020   GLUCOSE 113 (H) 03/12/2021   CHOL 152 03/12/2021   TRIG 78.0 03/12/2021   HDL 57.90 03/12/2021   LDLDIRECT 176.0 04/03/2011   LDLCALC 78 03/12/2021   ALT 64 (H) 03/12/2021   AST 51 (H) 03/12/2021   NA 143 03/12/2021   K 4.9 03/12/2021   CL 109 03/12/2021   CREATININE 1.11 03/12/2021   BUN 18 03/12/2021   CO2 27 03/12/2021   TSH 0.65 07/05/2020   PSA 0.13  07/05/2020   HGBA1C 6.6 (H) 03/12/2021   MICROALBUR <0.7 09/14/2019   Assessment/Plan:  Warren Weeks is a 76 y.o. Black or African American [2] male with  has a past medical history of ALLERGIC RHINITIS (09/16/2007), ALLERGY (03/24/2007), Allergy, ANXIETY (03/28/2007), Arthritis, CAROTID ARTERY STENOSIS, BILATERAL (07/01/2009), CEPHALGIA (06/08/2009), CHEST PAIN (09/16/2007), COLONIC POLYPS, HX OF (09/16/2007), COPD (chronic obstructive pulmonary disease) ([place]) (05/12/2017), Dizziness and giddiness (06/08/2009), Dyshidrotic eczema (06/03/2018), DYSPNEA/SHORTNESS OF BREATH (09/16/2007), ERECTILE DYSFUNCTION (03/28/2007), ESOPHAGEAL STRICTURE (09/16/2007), ESOPHAGITIS (03/24/2007), GERD (03/24/2007), GLUCOSE INTOLERANCE (09/16/2007), HEMORRHOIDS, INTERNAL (03/24/2007), HYPERCHOLESTEROLEMIA (03/24/2007), HYPERLIPIDEMIA (09/16/2007), HYPERTENSION (03/28/2007), Hypogonadism male (10/09/2011), Impaired glucose tolerance (10/04/2010), LOW BACK PAIN (03/28/2007), MAGNETIC RESONANCE IMAGING, BRAIN, ABNORMAL (06/26/2009), NECK PAIN, LEFT (06/20/2010), OBESITY (03/24/2007), OTITIS MEDIA, LEFT (06/10/2009), Palpitations (06/20/2010), PVD (06/25/2009), RASH-NONVESICULAR (06/17/2009), and URI (08/16/2009).  Acute sinus infection Mild to mod, for antibx course,  to f/u any worsening symptoms or concerns  Allergic rhinitis Mild to mod, for depomedrol IM 80, predpac asd, to f/u any worsening symptoms  or concerns  Essential hypertension BP Readings from Last 3 Encounters:  08/11/21 126/60  04/10/21 134/60  04/08/21 138/84   Stable, pt to continue medical treatment hct, lisinoprill, lopressor   Hyperlipidemia Lab Results  Component Value Date   LDLCALC 78 03/12/2021   Stable, pt to continue current statin crestor 40; also for cardiac CT score  Impaired glucose tolerance Lab Results  Component Value Date   HGBA1C 6.6 (H) 03/12/2021   Stable, pt to continue current medical treatment  - diet  Venous insufficiency D/w pt -  for bilateral compression stocking daytime  Followup: No follow-ups on file.  Cathlean Cower, MD 08/11/2021 8:39 PM [facility]

## 2021-08-11 NOTE — Assessment & Plan Note (Addendum)
Lab Results  Component Value Date   LDLCALC 78 03/12/2021   Stable, pt to continue current statin crestor 40; also for cardiac CT score

## 2021-08-11 NOTE — Assessment & Plan Note (Signed)
Lab Results  Component Value Date   HGBA1C 6.6 (H) 03/12/2021   Stable, pt to continue current medical treatment  - diet

## 2021-08-11 NOTE — Assessment & Plan Note (Signed)
D/w pt - for bilateral compression stocking daytime

## 2021-08-11 NOTE — Assessment & Plan Note (Signed)
BP Readings from Last 3 Encounters:  08/11/21 126/60  04/10/21 134/60  04/08/21 138/84   Stable, pt to continue medical treatment hct, lisinoprill, lopressor

## 2021-08-11 NOTE — Patient Instructions (Signed)
We have discussed the Cardiac CT Score test to measure the calcification level (if any) in your heart arteries.  This test has been ordered in our [place], so please call [facility] directly, as they prefer this, at [phone] to be scheduled.  You had the steroid shot today  Please take all new medication as prescribed - the antibiotic, and prednisone  Please continue all other medications as before, and refills have been done if requested.  Please have the pharmacy call with any other refills you may need.  Please keep your appointments with your specialists as you may have planned  Please consider wearing compression stockings for the legs with the venous insufficiency swelling  We'll see you back Sep 09, 2021, or sooner if needed

## 2021-08-11 NOTE — Assessment & Plan Note (Signed)
Mild to mod, for antibx course,  to f/u any worsening symptoms or concerns 

## 2021-08-11 NOTE — Assessment & Plan Note (Signed)
Mild to mod, for depomedrol IM 80, predpac asd, to f/u any worsening symptoms or concerns 

## 2021-08-13 ENCOUNTER — Ambulatory Visit (INDEPENDENT_AMBULATORY_CARE_PROVIDER_SITE_OTHER): Payer: Medicare PPO

## 2021-08-13 ENCOUNTER — Other Ambulatory Visit: Payer: Self-pay

## 2021-08-13 DIAGNOSIS — Z0000 Encounter for general adult medical examination without abnormal findings: Secondary | ICD-10-CM

## 2021-08-13 NOTE — Patient Instructions (Signed)
Warren Weeks , Thank you for taking time to come for your Medicare Wellness Visit. I appreciate your ongoing commitment to your health goals. Please review the following plan we discussed and let me know if I can assist you in the future.   Screening recommendations/referrals: Colonoscopy: 03/04/2018; due every 5 years Recommended yearly ophthalmology/optometry visit for glaucoma screening and checkup Recommended yearly dental visit for hygiene and checkup  Vaccinations: Influenza vaccine: 03/12/2021 Pneumococcal vaccine: 12/12/2013, 12/14/2014 Tdap vaccine: 12/07/2018; due every 10 years Shingles vaccine: 09/24/2017, 12/20/2017   Covid-19: 08/13/2019, 08/21/2019, 06/24/2020  Advanced directives: Advance directive discussed with you today. Even though you declined this today please call our office should you change your mind and we can give you the proper paperwork for you to fill out.  Conditions/risks identified: Yes; Client understands the importance of follow-up appointments with providers by attending scheduled visits and discussed goals to eat healthier, increase physical activity 5 times a week for 30 minutes each, exercise the brain by doing stimulating brain exercises (reading, adult coloring, crafting, listening to music, puzzles, etc.), socialize and enjoy life more, get enough sleep at least 8-9 hours average per night and make time for laughter.  Next appointment: 08/14/2022 at 10:40 a.m. telephone visit with Mignon Pine, Nurse Health Advisor.  If need to reschedule or cancel please call [phone].  Preventive Care 3 Years and Older, Male Preventive care refers to lifestyle choices and visits with your health care provider that can promote health and wellness. What does preventive care include? A yearly physical exam. This is also called an annual well check. Dental exams once or twice a year. Routine eye exams. Ask your health care provider how often you should have your eyes checked. Personal  lifestyle choices, including: Daily care of your teeth and gums. Regular physical activity. Eating a healthy diet. Avoiding tobacco and drug use. Limiting alcohol use. Practicing safe sex. Taking low doses of aspirin every day. Taking vitamin and mineral supplements as recommended by your health care provider. What happens during an annual well check? The services and screenings done by your health care provider during your annual well check will depend on your age, overall health, lifestyle risk factors, and family history of disease. Counseling  Your health care provider may ask you questions about your: Alcohol use. Tobacco use. Drug use. Emotional well-being. Home and relationship well-being. Sexual activity. Eating habits. History of falls. Memory and ability to understand (cognition). Work and work Statistician. Screening  You may have the following tests or measurements: Height, weight, and BMI. Blood pressure. Lipid and cholesterol levels. These may be checked every 5 years, or more frequently if you are over 60 years old. Skin check. Lung cancer screening. You may have this screening every year starting at age 64 if you have a 30-pack-year history of smoking and currently smoke or have quit within the past 15 years. Fecal occult blood test (FOBT) of the stool. You may have this test every year starting at age 55. Flexible sigmoidoscopy or colonoscopy. You may have a sigmoidoscopy every 5 years or a colonoscopy every 10 years starting at age 18. Prostate cancer screening. Recommendations will vary depending on your family history and other risks. Hepatitis C blood test. Hepatitis B blood test. Sexually transmitted disease (STD) testing. Diabetes screening. This is done by checking your blood sugar (glucose) after you have not eaten for a while (fasting). You may have this done every 1-3 years. Abdominal aortic aneurysm (AAA) screening. You may need this if  you are a  current or former smoker. Osteoporosis. You may be screened starting at age 7 if you are at high risk. Talk with your health care provider about your test results, treatment options, and if necessary, the need for more tests. Vaccines  Your health care provider may recommend certain vaccines, such as: Influenza vaccine. This is recommended every year. Tetanus, diphtheria, and acellular pertussis (Tdap, Td) vaccine. You may need a Td booster every 10 years. Zoster vaccine. You may need this after age 61. Pneumococcal 13-valent conjugate (PCV13) vaccine. One dose is recommended after age 19. Pneumococcal polysaccharide (PPSV23) vaccine. One dose is recommended after age 7. Talk to your health care provider about which screenings and vaccines you need and how often you need them. This information is not intended to replace advice given to you by your health care provider. Make sure you discuss any questions you have with your health care provider. Document Released: 07/05/2015 Document Revised: 02/26/2016 Document Reviewed: 04/09/2015 Elsevier Interactive Patient Education  2017 [place] Prevention in the Home Falls can cause injuries. They can happen to people of all ages. There are many things you can do to make your home safe and to help prevent falls. What can I do on the outside of my home? Regularly fix the edges of walkways and driveways and fix any cracks. Remove anything that might make you trip as you walk through a door, such as a raised step or threshold. Trim any bushes or trees on the path to your home. Use bright outdoor lighting. Clear any walking paths of anything that might make someone trip, such as rocks or tools. Regularly check to see if handrails are loose or broken. Make sure that both sides of any steps have handrails. Any raised decks and porches should have guardrails on the edges. Have any leaves, snow, or ice cleared regularly. Use sand or salt on  walking paths during winter. Clean up any spills in your garage right away. This includes oil or grease spills. What can I do in the bathroom? Use night lights. Install grab bars by the toilet and in the tub and shower. Do not use towel bars as grab bars. Use non-skid mats or decals in the tub or shower. If you need to sit down in the shower, use a plastic, non-slip stool. Keep the floor dry. Clean up any water that spills on the floor as soon as it happens. Remove soap buildup in the tub or shower regularly. Attach bath mats securely with double-sided non-slip rug tape. Do not have throw rugs and other things on the floor that can make you trip. What can I do in the bedroom? Use night lights. Make sure that you have a light by your bed that is easy to reach. Do not use any sheets or blankets that are too big for your bed. They should not hang down onto the floor. Have a firm chair that has side arms. You can use this for support while you get dressed. Do not have throw rugs and other things on the floor that can make you trip. What can I do in the kitchen? Clean up any spills right away. Avoid walking on wet floors. Keep items that you use a lot in easy-to-reach places. If you need to reach something above you, use a strong step stool that has a grab bar. Keep electrical cords out of the way. Do not use floor polish or wax that makes floors slippery. If  you must use wax, use non-skid floor wax. Do not have throw rugs and other things on the floor that can make you trip. What can I do with my stairs? Do not leave any items on the stairs. Make sure that there are handrails on both sides of the stairs and use them. Fix handrails that are broken or loose. Make sure that handrails are as long as the stairways. Check any carpeting to make sure that it is firmly attached to the stairs. Fix any carpet that is loose or worn. Avoid having throw rugs at the top or bottom of the stairs. If you do  have throw rugs, attach them to the floor with carpet tape. Make sure that you have a light switch at the top of the stairs and the bottom of the stairs. If you do not have them, ask someone to add them for you. What else can I do to help prevent falls? Wear shoes that: Do not have high heels. Have rubber bottoms. Are comfortable and fit you well. Are closed at the toe. Do not wear sandals. If you use a stepladder: Make sure that it is fully opened. Do not climb a closed stepladder. Make sure that both sides of the stepladder are locked into place. Ask someone to hold it for you, if possible. Clearly mark and make sure that you can see: Any grab bars or handrails. First and last steps. Where the edge of each step is. Use tools that help you move around (mobility aids) if they are needed. These include: Canes. Walkers. Scooters. Crutches. Turn on the lights when you go into a dark area. Replace any light bulbs as soon as they burn out. Set up your furniture so you have a clear path. Avoid moving your furniture around. If any of your floors are uneven, fix them. If there are any pets around you, be aware of where they are. Review your medicines with your doctor. Some medicines can make you feel dizzy. This can increase your chance of falling. Ask your doctor what other things that you can do to help prevent falls. This information is not intended to replace advice given to you by your health care provider. Make sure you discuss any questions you have with your health care provider. Document Released: 04/04/2009 Document Revised: 11/14/2015 Document Reviewed: 07/13/2014 Elsevier Interactive Patient Education  2017 [employer].

## 2021-08-13 NOTE — Progress Notes (Signed)
I connected with Warren Weeks today by telephone and verified that I am speaking with the correct person using two identifiers. Location patient: home Location provider: work Persons participating in the virtual visit: patient, provider.   I discussed the limitations, risks, security and privacy concerns of performing an evaluation and management service by telephone and the availability of in person appointments. I also discussed with the patient that there may be a patient responsible charge related to this service. The patient expressed understanding and verbally consented to this telephonic visit.    Interactive audio and video telecommunications were attempted between this provider and patient, however failed, due to patient having technical difficulties OR patient did not have access to video capability.  We continued and completed visit with audio only.  Some vital signs may be absent or patient reported.   Time Spent with patient on telephone encounter: 40 minutes  Subjective:   Warren Weeks is a 76 y.o. male who presents for Medicare Annual/Subsequent preventive examination.  Review of Systems     Cardiac Risk Factors include: advanced age (>94men, >36 women);dyslipidemia;family history of premature cardiovascular disease;male gender;hypertension;obesity (BMI >30kg/m2)     Objective:    There were no vitals filed for this visit. There is no height or weight on file to calculate BMI.  Advanced Directives 08/13/2021 01/03/2021 12/26/2020 10/10/2020 08/01/2020 03/24/2019 03/16/2018  Does Patient Have a Medical Advance Directive? No No No No No No No  Would patient like information on creating a medical advance directive? No - Patient declined No - Patient declined No - Patient declined No - Patient declined No - Patient declined No - Patient declined Yes (ED - Information included in AVS)    Current Medications (verified) Outpatient Encounter Medications as of 08/13/2021  Medication  Sig   acetaminophen (TYLENOL) 650 MG CR tablet Take 650 mg by mouth every 8 (eight) hours as needed for pain.   colchicine 0.6 MG tablet Take two pills on day one, and then one pill bid until symptoms resolve, but do not take longer than 3 days (Patient not taking: Reported on 04/10/2021)   Cyanocobalamin (B-12 PO) Take 1 tablet by mouth daily.   diclofenac (VOLTAREN) 75 MG EC tablet Take 1 tablet (75 mg total) by mouth 2 (two) times daily as needed.   doxycycline (VIBRA-TABS) 100 MG tablet Take 1 tablet (100 mg total) by mouth 2 (two) times daily.   ezetimibe (ZETIA) 10 MG tablet Take 1 tablet by mouth once daily   fluticasone (FLONASE) 50 MCG/ACT nasal spray Use 2 spray(s) in each nostril once daily (Patient not taking: Reported on 04/10/2021)   hydrochlorothiazide (HYDRODIURIL) 25 MG tablet Take 1 tablet (25 mg total) by mouth daily. (Patient taking differently: Take 25 mg by mouth daily as needed.)   lisinopril (ZESTRIL) 20 MG tablet Take 1 tablet by mouth once daily   Melatonin 10 MG TABS Take 10 mg by mouth at bedtime.    methocarbamol (ROBAXIN) 500 MG tablet Take 1 tablet (500 mg total) by mouth 2 (two) times daily as needed.   metoprolol tartrate (LOPRESSOR) 50 MG tablet Take 1 tablet by mouth twice daily   mometasone (NASONEX) 50 MCG/ACT nasal spray Place 2 sprays into the nose daily.   Multiple Vitamin (ONE-A-DAY 55 PLUS PO) Take 1 tablet by mouth daily.   Omega-3 Fatty Acids (FISH OIL) 1000 MG CAPS Take by mouth.   omeprazole (PRILOSEC) 20 MG capsule Take 1 capsule by mouth twice daily   predniSONE (  DELTASONE) 10 MG tablet 3 tabs by mouth per day for 3 days,2tabs per day for 3 days,1tab per day for 3 days   rosuvastatin (CRESTOR) 40 MG tablet Take 1 tablet by mouth once daily   sildenafil (VIAGRA) 100 MG tablet Take 1 tablet (100 mg total) by mouth as needed for erectile dysfunction.   solifenacin (VESICARE) 5 MG tablet Take 1 tablet (5 mg total) by mouth daily.   terazosin (HYTRIN) 5  MG capsule Take 5 mg by mouth at bedtime.    Testosterone 20.25 MG/1.25GM (1.62%) GEL SMARTSIG:1 Packet(s) T-DERMAL Daily   thiamine 250 MG tablet Take 250 mg by mouth daily.   tiZANidine (ZANAFLEX) 2 MG tablet Take 1 tablet (2 mg total) by mouth 2 (two) times daily as needed for muscle spasms. (Patient not taking: Reported on 04/10/2021)   traMADol (ULTRAM) 50 MG tablet Take 1 tablet (50 mg total) by mouth 2 (two) times daily as needed for moderate pain or severe pain.   triamcinolone cream (KENALOG) 0.5 % Apply 1 application topically 3 (three) times daily.   Turmeric 500 MG TABS Take by mouth daily.   No facility-administered encounter medications on file as of 08/13/2021.    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   DYSPNEA/SHORTNESS OF BREATH 09/16/2007   ERECTILE DYSFUNCTION 03/28/2007   ESOPHAGEAL STRICTURE 09/16/2007   ESOPHAGITIS 03/24/2007   GERD 03/24/2007   GLUCOSE INTOLERANCE 09/16/2007   HEMORRHOIDS, INTERNAL 03/24/2007   HYPERCHOLESTEROLEMIA 03/24/2007   HYPERLIPIDEMIA 09/16/2007   HYPERTENSION 03/28/2007   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/2011   Impaired glucose tolerance 10/04/2010   LOW BACK PAIN 03/28/2007   MAGNETIC RESONANCE IMAGING, BRAIN, ABNORMAL 06/26/2009   NECK PAIN, LEFT 06/20/2010   OBESITY 03/24/2007   OTITIS MEDIA, LEFT 06/10/2009   Palpitations 06/20/2010   no current problems   PVD 06/25/2009   RASH-NONVESICULAR 06/17/2009   URI 08/16/2009   Past  Surgical History:  Procedure Laterality Date   COLONOSCOPY  03/2013   polyps/[place]   NASAL SEPTOPLASTY W/ TURBINOPLASTY Bilateral 01/03/2021   Procedure: NASAL SEPTOPLASTY WITH BILATERAL TURBINATE REDUCTION;  Surgeon: Leta Baptist, MD;  Location: [facility];  Service: ENT;  Laterality: Bilateral;   s/p right knee arthroscopy     UPPER GASTROINTESTINAL ENDOSCOPY     [facility]   Family History  Problem Relation Age of Onset   Lymphoma Mother    Heart disease Father        CHF   Ovarian cancer Sister    Lung cancer Sister    Coronary artery disease Sister        CABG   Coronary artery disease Brother        stent, and CAD   Colon cancer Neg Hx    Esophageal cancer Neg Hx    Rectal cancer Neg Hx    Stomach cancer Neg Hx    Colon polyps Neg Hx    Social History   Socioeconomic  History   Marital status: Married    Spouse name: Daisy   Number of children: 2   Years of education: Not on file   Highest education level: Not on file  Occupational History   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  Other Topics Concern   Not on file  Social History Narrative   Not on file   Social Determinants of Health   Financial Resource Strain: Low Risk    Difficulty of Paying Living Expenses: Not hard at all  Food Insecurity: No Food Insecurity   Worried About Charity fundraiser in the Last Year: Never true   [place] in the Last Year: Never true  Transportation Needs: No Transportation Needs   Lack of Transportation (Medical): No   Lack  of Transportation (Non-Medical): No  Physical Activity: Inactive   Days of Exercise per Week: 0 days   Minutes of Exercise per Session: 0 min  Stress: No Stress Concern Present   Feeling of Stress : Not at all  Social Connections: Socially Integrated   Frequency of Communication with Friends and Family: More than three times a week   Frequency of Social Gatherings with Friends and Family: More than three times a week   Attends Religious Services: More than 4 times per year   Active Member of [employer] or Organizations: No   Attends Music therapist: More than 4 times per year   Marital Status: Married    Tobacco Counseling Counseling given: Not Answered Tobacco comments: Quit 30 years ago   Clinical Intake:  Pre-visit preparation completed: Yes  Pain : No/denies pain     BMI - recorded: 36.16 Nutritional Status: BMI > 30  Obese Nutritional Risks: None Diabetes: No  How often do you need to have someone help you when you read instructions, pamphlets, or other written materials from your doctor or pharmacy?: 1 - Never What is the last grade level you completed in school?: HSG; Military Veteran  Diabetic? no  Interpreter Needed?: No  Information entered by :: Lisette Abu, LPN   Activities of Daily Living In your present state of health, do you have any difficulty performing the following activities: 08/13/2021 01/03/2021  Hearing? Y N  Vision? N N  Difficulty concentrating or making decisions? N N  Walking or climbing stairs? N Y  Dressing or bathing? N N  Doing errands, shopping? N -  Preparing Food and eating ? N -  Using the Toilet? N -  In the past six months, have you accidently leaked urine? N -  Do you have problems with loss of bowel control? N -  Managing your Medications? N -  Managing your Finances? N -  Housekeeping or managing your Housekeeping? N -  Some recent data might be hidden    Patient Care Team: Biagio Borg, MD as PCP -  Gerilyn Pilgrim,  Elta Guadeloupe, MD as Consulting Physician (Ophthalmology)  Indicate any recent Medical Services you may have received from other than [facility] providers in the past year (date may be approximate).     Assessment:   This is a routine wellness examination for Zeeshan.  Hearing/Vision screen Hearing Screening - Comments:: Patient has issues with tinnitus. No hearing aids. Vision Screening - Comments:: Patient wears corrective glasses/contacts.   Eye exam done annually by: Rutherford Guys, MD.  Dietary issues and exercise activities discussed: Current Exercise Habits: The patient does not participate in regular exercise at present, Exercise limited by: orthopedic condition(s);neurologic condition(s);cardiac condition(s)   Goals Addressed             This Visit's Progress    Patient Stated       My goal is to improve my walking.      Depression Screen PHQ 2/9 Scores 08/13/2021 03/12/2021 08/01/2020 07/05/2020 09/14/2019 03/24/2019 12/07/2018  PHQ - 2 Score 0 0 0 0 0 0 0  PHQ- 9 Score - - - - - - -    Fall Risk Fall Risk  08/13/2021 03/12/2021 08/01/2020 07/05/2020 09/14/2019  Falls in the past year? 0 0 0 0 0  Number falls in past yr: 0 0 0 - -  Injury with Fall? 0 0 0 - -  Risk for fall due to : No Fall Risks - No Fall Risks - -  Follow up Falls evaluation completed - - - -    FALL RISK PREVENTION PERTAINING TO THE HOME:  Any stairs in or around the home? Yes  If so, are there any without handrails? No  Home free of loose throw rugs in walkways, pet beds, electrical cords, etc? Yes  Adequate lighting in your home to reduce risk of falls? Yes   ASSISTIVE DEVICES UTILIZED TO PREVENT FALLS:  Life alert? No  Use of a cane, walker or w/c? Yes  Grab bars in the bathroom? No  Shower chair or bench in shower? No  Elevated toilet seat or a handicapped toilet? Yes   TIMED UP AND GO:  Was the test performed? No .  Length of time to ambulate 10 feet: n/a sec.   Gait steady and  fast with assistive device  Cognitive Function: Normal cognitive status assessed by direct observation by this Nurse Health Advisor. No abnormalities found.          Immunizations Immunization History  Administered Date(s) Administered   Fluad Quad(high Dose 65+) 02/21/2019, 03/12/2021   H1N1 04/22/2008   Influenza Whole 06/10/2009   Influenza, High Dose Seasonal PF 03/20/2016, 03/16/2017, 03/16/2018   Influenza,inj,Quad PF,6+ Mos 04/10/2015   Influenza-Unspecified 03/22/2012, 09/03/2014, 04/22/2020   PFIZER(Purple Top)SARS-COV-2 Vaccination 07/24/2019, 08/21/2019, 06/24/2020   Pneumococcal Conjugate-13 12/12/2013   Pneumococcal Polysaccharide-23 12/14/2014   Td 10/03/2008   Tdap 12/07/2018   Zoster Recombinat (Shingrix) 09/24/2017, 12/20/2017   Zoster, Live 10/12/2012    TDAP status: Up to date  Flu Vaccine status: Up to date  Pneumococcal vaccine status: Up to date  Covid-19 vaccine status: Completed vaccines  Qualifies for Shingles Vaccine? Yes   Zostavax completed Yes   Shingrix Completed?: Yes  Screening Tests Health Maintenance  Topic Date Due   COVID-19 Vaccine (4 - Booster for Pfizer series) 08/19/2020   COLONOSCOPY (Pts 45-56yrs Insurance coverage will need to be confirmed)  03/05/2023   TETANUS/TDAP  12/06/2028   Pneumonia Vaccine 8+ Years old  Completed   INFLUENZA VACCINE  Completed   Hepatitis C Screening  Completed  Zoster Vaccines- Shingrix  Completed   HPV VACCINES  Aged Out    Health Maintenance  Health Maintenance Due  Topic Date Due   COVID-19 Vaccine (4 - Booster for Pfizer series) 08/19/2020    Colorectal cancer screening: Type of screening: Colonoscopy. Completed 03/04/2018. Repeat every 5 years  Lung Cancer Screening: (Low Dose CT Chest recommended if Age 18-80 years, 30 pack-year currently smoking OR have quit w/in 15years.) does not qualify.   Lung Cancer Screening Referral: no  Additional Screening:  Hepatitis C Screening:  does qualify; Completed yes  Vision Screening: Recommended annual ophthalmology exams for early detection of glaucoma and other disorders of the eye. Is the patient up to date with their annual eye exam?  Yes  Who is the provider or what is the name of the office in which the patient attends annual eye exams? Rutherford Guys, MD. If pt is not established with a provider, would they like to be referred to a provider to establish care? No .   Dental Screening: Recommended annual dental exams for proper oral hygiene  Community Resource Referral / Chronic Care Management: CRR required this visit?  No   CCM required this visit?  No      Plan:     I have personally reviewed and noted the following in the patients chart:   Medical and social history Use of alcohol, tobacco or illicit drugs  Current medications and supplements including opioid prescriptions. Patient is not currently taking opioid prescriptions. Functional ability and status Nutritional status Physical activity Advanced directives List of other physicians Hospitalizations, surgeries, and ER visits in previous 12 months Vitals Screenings to include cognitive, depression, and falls Referrals and appointments  In addition, I have reviewed and discussed with patient certain preventive protocols, quality metrics, and best practice recommendations. A written personalized care plan for preventive services as well as general preventive health recommendations were provided to patient.     Sheral Flow, LPN   [id number]   Nurse Notes:  Patient is cogitatively intact. There were no vitals filed for this visit. There is no height or weight on file to calculate BMI.

## 2021-08-14 DIAGNOSIS — E291 Testicular hypofunction: Secondary | ICD-10-CM | POA: Diagnosis not present

## 2021-08-19 DIAGNOSIS — M542 Cervicalgia: Secondary | ICD-10-CM | POA: Diagnosis not present

## 2021-08-25 ENCOUNTER — Other Ambulatory Visit: Payer: Self-pay | Admitting: Internal Medicine

## 2021-08-25 DIAGNOSIS — M47812 Spondylosis without myelopathy or radiculopathy, cervical region: Secondary | ICD-10-CM | POA: Diagnosis not present

## 2021-08-25 NOTE — Telephone Encounter (Signed)
Please refill as per office routine med refill policy (all routine meds to be refilled for 3 mo or monthly (per pt preference) up to one year from last visit, then month to month grace period for 3 mo, then further med refills will have to be denied) ? ?

## 2021-08-29 DIAGNOSIS — M542 Cervicalgia: Secondary | ICD-10-CM | POA: Diagnosis not present

## 2021-09-04 ENCOUNTER — Other Ambulatory Visit: Payer: Self-pay | Admitting: Internal Medicine

## 2021-09-04 NOTE — Telephone Encounter (Signed)
Please refill as per office routine med refill policy (all routine meds to be refilled for 3 mo or monthly (per pt preference) up to one year from last visit, then month to month grace period for 3 mo, then further med refills will have to be denied) ? ?

## 2021-09-08 DIAGNOSIS — M542 Cervicalgia: Secondary | ICD-10-CM | POA: Diagnosis not present

## 2021-09-09 ENCOUNTER — Ambulatory Visit: Payer: Medicare PPO | Admitting: Internal Medicine

## 2021-09-12 DIAGNOSIS — M542 Cervicalgia: Secondary | ICD-10-CM | POA: Diagnosis not present

## 2021-09-15 DIAGNOSIS — M542 Cervicalgia: Secondary | ICD-10-CM | POA: Diagnosis not present

## 2021-09-17 ENCOUNTER — Encounter: Payer: Self-pay | Admitting: Internal Medicine

## 2021-09-17 ENCOUNTER — Ambulatory Visit: Payer: Medicare PPO | Admitting: Internal Medicine

## 2021-09-17 VITALS — BP 130/70 | HR 60 | Resp 18 | Ht 70.0 in | Wt 253.0 lb

## 2021-09-17 DIAGNOSIS — I872 Venous insufficiency (chronic) (peripheral): Secondary | ICD-10-CM | POA: Diagnosis not present

## 2021-09-17 DIAGNOSIS — Z0001 Encounter for general adult medical examination with abnormal findings: Secondary | ICD-10-CM | POA: Diagnosis not present

## 2021-09-17 DIAGNOSIS — E559 Vitamin D deficiency, unspecified: Secondary | ICD-10-CM

## 2021-09-17 DIAGNOSIS — I10 Essential (primary) hypertension: Secondary | ICD-10-CM | POA: Diagnosis not present

## 2021-09-17 DIAGNOSIS — E538 Deficiency of other specified B group vitamins: Secondary | ICD-10-CM

## 2021-09-17 DIAGNOSIS — E7800 Pure hypercholesterolemia, unspecified: Secondary | ICD-10-CM | POA: Diagnosis not present

## 2021-09-17 DIAGNOSIS — R600 Localized edema: Secondary | ICD-10-CM

## 2021-09-17 DIAGNOSIS — M7989 Other specified soft tissue disorders: Secondary | ICD-10-CM

## 2021-09-17 DIAGNOSIS — R7302 Impaired glucose tolerance (oral): Secondary | ICD-10-CM

## 2021-09-17 DIAGNOSIS — R609 Edema, unspecified: Secondary | ICD-10-CM

## 2021-09-17 DIAGNOSIS — J309 Allergic rhinitis, unspecified: Secondary | ICD-10-CM

## 2021-09-17 DIAGNOSIS — G4710 Hypersomnia, unspecified: Secondary | ICD-10-CM

## 2021-09-17 LAB — CBC WITH DIFFERENTIAL/PLATELET
Basophils Absolute: 0 10*3/uL (ref 0.0–0.1)
Basophils Relative: 0.4 % (ref 0.0–3.0)
Eosinophils Absolute: 0.1 10*3/uL (ref 0.0–0.7)
Eosinophils Relative: 1.6 % (ref 0.0–5.0)
HCT: 39.5 % (ref 39.0–52.0)
Hemoglobin: 13 g/dL (ref 13.0–17.0)
Lymphocytes Relative: 21 % (ref 12.0–46.0)
Lymphs Abs: 1.3 10*3/uL (ref 0.7–4.0)
MCHC: 33 g/dL (ref 30.0–36.0)
MCV: 90.8 fl (ref 78.0–100.0)
Monocytes Absolute: 0.5 10*3/uL (ref 0.1–1.0)
Monocytes Relative: 7.8 % (ref 3.0–12.0)
Neutro Abs: 4.2 10*3/uL (ref 1.4–7.7)
Neutrophils Relative %: 69.2 % (ref 43.0–77.0)
Platelets: 146 10*3/uL — ABNORMAL LOW (ref 150.0–400.0)
RBC: 4.35 Mil/uL (ref 4.22–5.81)
RDW: 14.4 % (ref 11.5–15.5)
WBC: 6.1 10*3/uL (ref 4.0–10.5)

## 2021-09-17 LAB — URINALYSIS, ROUTINE W REFLEX MICROSCOPIC
Bilirubin Urine: NEGATIVE
Hgb urine dipstick: NEGATIVE
Leukocytes,Ua: NEGATIVE
Nitrite: NEGATIVE
RBC / HPF: NONE SEEN (ref 0–?)
Specific Gravity, Urine: 1.02 (ref 1.000–1.030)
Total Protein, Urine: NEGATIVE
Urine Glucose: NEGATIVE
Urobilinogen, UA: 0.2 (ref 0.0–1.0)
pH: 5.5 (ref 5.0–8.0)

## 2021-09-17 LAB — LIPID PANEL
Cholesterol: 146 mg/dL (ref 0–200)
HDL: 62.3 mg/dL (ref >39.00)
LDL Cholesterol: 61 mg/dL (ref 0–99)
NonHDL: 83.85
Total CHOL/HDL Ratio: 2
Triglycerides: 116 mg/dL (ref 0.0–149.0)
VLDL: 23.2 mg/dL (ref 0.0–40.0)

## 2021-09-17 LAB — VITAMIN B12: Vitamin B-12: 1454 pg/mL — ABNORMAL HIGH (ref 211–911)

## 2021-09-17 LAB — HEPATIC FUNCTION PANEL
ALT: 62 U/L — ABNORMAL HIGH (ref 0–53)
AST: 43 U/L — ABNORMAL HIGH (ref 0–37)
Albumin: 4 g/dL (ref 3.5–5.2)
Alkaline Phosphatase: 72 U/L (ref 39–117)
Bilirubin, Direct: 0.1 mg/dL (ref 0.0–0.3)
Total Bilirubin: 0.6 mg/dL (ref 0.2–1.2)
Total Protein: 6.3 g/dL (ref 6.0–8.3)

## 2021-09-17 LAB — BASIC METABOLIC PANEL
BUN: 16 mg/dL (ref 6–23)
CO2: 27 mEq/L (ref 19–32)
Calcium: 8.9 mg/dL (ref 8.4–10.5)
Chloride: 107 mEq/L (ref 96–112)
Creatinine, Ser: 1.05 mg/dL (ref 0.40–1.50)
GFR: 70.52 mL/min (ref >60.00)
Glucose, Bld: 92 mg/dL (ref 70–99)
Potassium: 4.8 mEq/L (ref 3.5–5.1)
Sodium: 141 mEq/L (ref 135–145)

## 2021-09-17 LAB — TSH: TSH: 1.55 u[IU]/mL (ref 0.35–5.50)

## 2021-09-17 LAB — VITAMIN D 25 HYDROXY (VIT D DEFICIENCY, FRACTURES): VITD: 30.13 ng/mL (ref 30.00–100.00)

## 2021-09-17 LAB — BRAIN NATRIURETIC PEPTIDE: Pro B Natriuretic peptide (BNP): 453 pg/mL — ABNORMAL HIGH (ref 0.0–100.0)

## 2021-09-17 LAB — HEMOGLOBIN A1C: Hgb A1c MFr Bld: 6.9 % — ABNORMAL HIGH (ref 4.6–6.5)

## 2021-09-17 LAB — PSA: PSA: 0.1 ng/mL (ref 0.10–4.00)

## 2021-09-17 MED ORDER — AZELASTINE HCL 0.05 % OP SOLN: 1.0000 [drp] | Freq: Two times a day (BID) | OPHTHALMIC | 12 refills | Status: DC

## 2021-09-17 MED ORDER — TRIAMCINOLONE ACETONIDE 55 MCG/ACT NA AERO: 2.0000 | INHALATION_SPRAY | Freq: Every day | NASAL | 12 refills | Status: DC

## 2021-09-17 NOTE — Assessment & Plan Note (Signed)
With increased symptoms, likely osa, for pulm referral ?

## 2021-09-17 NOTE — Assessment & Plan Note (Signed)

## 2021-09-17 NOTE — Assessment & Plan Note (Signed)
Lab Results  ?Component Value Date  ? [place] [address] 09/17/2021  ? ?Stable, pt to continue current statin crestor ? ?

## 2021-09-17 NOTE — Assessment & Plan Note (Signed)
Uncontrolled with eye symptoms as well - for nasacort and zaditor/optivar,  to f/u any worsening symptoms or concerns ?

## 2021-09-17 NOTE — Assessment & Plan Note (Signed)
BP Readings from Last 3 Encounters:  ?09/17/21 130/70  ?08/11/21 126/60  ?04/10/21 134/60  ? ?Stable, pt to continue medical treatment hct, lisinopril, lopressor ? ? ?

## 2021-09-17 NOTE — Patient Instructions (Addendum)
Please take all new medication as prescribed - the nasacort and optivar for allergies ? ?Please wear compression stocking to both legs below the knees during the daytime only ? ?Please continue all other medications as before, and refills have been done if requested. ? ?Please have the pharmacy call with any other refills you may need. ? ?Please continue your efforts at being more active, low cholesterol diet, and weight control. ? ?You are otherwise up to date with prevention measures today. ? ?Please keep your appointments with your specialists as you may have planned ? ?You will be contacted regarding the referral for: pulmonary to see about sleep apnea ? ?Please be sure to follow up with the Cardiac CT score testing next month as you have planned ? ?Please go to the LAB at the blood drawing area for the tests to be done ? ?You will be contacted by phone if any changes need to be made immediately.  Otherwise, you will receive a letter about your results with an explanation, but please check with MyChart first. ? ?Please remember to sign up for MyChart if you have not done so, as this will be important to you in the future with finding out test results, communicating by private email, and scheduling acute appointments online when needed. ? ?Please make an Appointment to return in 6 months, or sooner if needed ? ?

## 2021-09-17 NOTE — Assessment & Plan Note (Signed)
Lab Results  ?Component Value Date  ? HGBA1C 6.9 (H) 09/17/2021  ? ?Stable, pt to continue current medical treatment  - diet ? ?

## 2021-09-17 NOTE — Assessment & Plan Note (Signed)
?   Mild worsening, for BNP, compression stocking, low salt diet, leg elevation ?

## 2021-09-17 NOTE — Progress Notes (Signed)
Patient ID: Warren Weeks, male   DOB: 1945/08/25, 76 y.o.   MRN: [id number] ? ? ? ?     Chief Complaint:: wellness exam and 6 month f/u (He mentioned that his left leg swells up at times, at night time his left leg goes to sleep. He would like his sugars rechecked. ) ? , allergies, hypersomnolence ? ?     HPI:  Warren Weeks is a 76 y.o. male here for wellness exam; declines covid booster, o.w up to date ?         ?              Also c/o persistent daytime sleepiness and can drop off anytime, snores at night, often has headaches, has gained significant wt in last few years, hard to lose.  Pt denies chest pain, increased sob or doe, wheezing, orthopnea, PND, palpitations, dizziness or syncope, but has worsening mild bilateral leg swelling worse in the PM, better in the AM   Does have several wks ongoing nasal allergy symptoms with clearish congestion, itch and sneezing, without fever, pain, ST, cough, swelling or wheezing., also with eye itching and weepiness as well.    Pt denies polydipsia, polyuria, or new focal neuro s/s.    Pt denies fever, wt loss, night sweats, loss of appetite, or other constitutional symptoms   Also Is on schedule for Card CT Score for late April 2023. ?Wt Readings from Last 3 Encounters:  ?09/17/21 253 lb (114.8 kg)  ?08/11/21 252 lb (114.3 kg)  ?04/10/21 253 lb (114.8 kg)  ? ?BP Readings from Last 3 Encounters:  ?09/17/21 130/70  ?08/11/21 126/60  ?04/10/21 134/60  ? ?Immunization History  ?Administered Date(s) Administered  ? Fluad Quad(high Dose 65+) 02/21/2019, 03/12/2021  ? H1N1 04/22/2008  ? Influenza Whole 06/10/2009  ? Influenza, High Dose Seasonal PF 03/20/2016, 03/16/2017, 03/16/2018  ? Influenza,inj,Quad PF,6+ Mos 04/10/2015  ? Influenza-Unspecified 03/22/2012, 09/03/2014, 04/22/2020  ? PFIZER(Purple Top)SARS-COV-2 Vaccination 07/24/2019, 08/21/2019, 06/24/2020  ? Pneumococcal Conjugate-13 12/12/2013  ? Pneumococcal Polysaccharide-23 12/14/2014  ? Td 10/03/2008  ? Tdap 12/07/2018   ? Zoster Recombinat (Shingrix) 09/24/2017, 12/20/2017  ? Zoster, Live 10/12/2012  ? ?There are no preventive care reminders to display for this patient. ? ?  ? ?Past Medical History:  ?Diagnosis Date  ? ALLERGIC RHINITIS 09/16/2007  ? ALLERGY 03/24/2007  ? Allergy   ? ANXIETY 03/28/2007  ? Arthritis   ? knees, back,shoulder  ? CAROTID ARTERY STENOSIS, BILATERAL 07/01/2009  ? yearly checks - no current problems  ? CEPHALGIA 06/08/2009  ? CHEST PAIN 09/16/2007  ? no current problems  ? COLONIC POLYPS, HX OF 09/16/2007  ? COPD (chronic obstructive pulmonary disease) ([place]) 05/12/2017  ? mild per patient - no inhaler  ? Dizziness and giddiness 06/08/2009  ? 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 DYSPNEA/SHORTNESS OF BREATH 09/16/2007  ? ERECTILE DYSFUNCTION 03/28/2007  ? ESOPHAGEAL STRICTURE 09/16/2007  ? ESOPHAGITIS 03/24/2007  ? GERD 03/24/2007  ? GLUCOSE INTOLERANCE 09/16/2007  ? HEMORRHOIDS, INTERNAL 03/24/2007  ? HYPERCHOLESTEROLEMIA 03/24/2007  ? HYPERLIPIDEMIA 09/16/2007  ? HYPERTENSION 03/28/2007  ? 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 Impaired glucose tolerance 10/04/2010  ? LOW BACK PAIN 03/28/2007  ? MAGNETIC RESONANCE IMAGING, BRAIN, ABNORMAL 06/26/2009  ? NECK PAIN, LEFT 06/20/2010  ? OBESITY 03/24/2007  ? OTITIS MEDIA, LEFT 06/10/2009  ? Palpitations 06/20/2010  ? no current problems  ? PVD 06/25/2009  ? RASH-NONVESICULAR 06/17/2009  ? URI 08/16/2009  ? ?Past Surgical History:  ?Procedure Laterality  Date  ? COLONOSCOPY  03/2013  ? polyps/[place]  ? NASAL SEPTOPLASTY W/ TURBINOPLASTY Bilateral 01/03/2021  ? Procedure: NASAL SEPTOPLASTY WITH BILATERAL TURBINATE REDUCTION;  Surgeon: Leta Baptist, MD;  Location: [facility];  Service: ENT;  Laterality: Bilateral;  ? s/p right knee arthroscopy    ? UPPER GASTROINTESTINAL ENDOSCOPY    ? gerd  ? ? reports that he has quit smoking. His smoking use included cigarettes. He has a 7.50 pack-year smoking history. He has never used smokeless tobacco. He reports current alcohol use of about 7.0  standard drinks per week. He reports that he does not use drugs. ?family history includes Coronary artery disease in his brother and sister; Heart disease in his father; Lung cancer in his sister; Lymphoma in his mother; Ovarian cancer in his sister. ?Allergies  ?Allergen Reactions  ? Cefuroxime Axetil Rash  ? ?Current Outpatient Medications on File Prior to Visit  ?Medication Sig Dispense Refill  ? acetaminophen (TYLENOL) 650 MG CR tablet Take 650 mg by mouth every 8 (eight) hours as needed for pain.    ? Cyanocobalamin (B-12 PO) Take 1 tablet by mouth daily.    ? diclofenac (VOLTAREN) 75 MG EC tablet Take 1 tablet (75 mg total) by mouth 2 (two) times daily as needed. 60 tablet 2  ? doxycycline (VIBRA-TABS) 100 MG tablet Take 1 tablet (100 mg total) by mouth 2 (two) times daily. 20 tablet 0  ? ezetimibe (ZETIA) 10 MG tablet Take 1 tablet by mouth once daily 90 tablet 3  ? hydrochlorothiazide (HYDRODIURIL) 25 MG tablet Take 1 tablet (25 mg total) by mouth daily. (Patient taking differently: Take 25 mg by mouth daily as needed.) 90 tablet 3  ? lisinopril (ZESTRIL) 20 MG tablet Take 1 tablet by mouth once daily 90 tablet 3  ? Melatonin 10 MG TABS Take 10 mg by mouth at bedtime.     ? methocarbamol (ROBAXIN) 500 MG tablet Take 1 tablet (500 mg total) by mouth 2 (two) times daily as needed. 40 tablet 2  ? metoprolol tartrate (LOPRESSOR) 50 MG tablet Take 1 tablet by mouth twice daily 180 tablet 0  ? mometasone (NASONEX) 50 MCG/ACT nasal spray Place 2 sprays into the nose daily. 1 each 12  ? Multiple Vitamin (ONE-A-DAY 55 PLUS PO) Take 1 tablet by mouth daily.    ? Omega-3 Fatty Acids (FISH OIL) 1000 MG CAPS Take by mouth.    ? omeprazole (PRILOSEC) 20 MG capsule Take 1 capsule by mouth twice daily 180 capsule 0  ? rosuvastatin (CRESTOR) 40 MG tablet Take 1 tablet by mouth once daily 30 tablet 0  ? sildenafil (VIAGRA) 100 MG tablet Take 1 tablet (100 mg total) by mouth as needed for erectile dysfunction. 10 tablet 11  ?  solifenacin (VESICARE) 5 MG tablet Take 1 tablet (5 mg total) by mouth daily. 90 tablet 3  ? terazosin (HYTRIN) 5 MG capsule Take 5 mg by mouth at bedtime.     ? Testosterone 20.25 MG/1.25GM (1.62%) GEL SMARTSIG:1 Packet(s) T-DERMAL Daily    ? thiamine 250 MG tablet Take 250 mg by mouth daily.    ? tiZANidine (ZANAFLEX) 2 MG tablet Take 1 tablet (2 mg total) by mouth 2 (two) times daily as needed for muscle spasms. 30 tablet 0  ? traMADol (ULTRAM) 50 MG tablet Take 1 tablet (50 mg total) by mouth 2 (two) times daily as needed for moderate pain or severe pain. 14 tablet 0  ? triamcinolone cream (KENALOG)  0.5 % Apply 1 application topically 3 (three) times daily. 30 g 1  ? Turmeric 500 MG TABS Take by mouth daily.    ? colchicine 0.6 MG tablet Take two pills on day one, and then one pill bid until symptoms resolve, but do not take longer than 3 days (Patient not taking: Reported on 04/10/2021) 8 tablet 2  ? fluticasone (FLONASE) 50 MCG/ACT nasal spray Use 2 spray(s) in each nostril once daily (Patient not taking: Reported on 04/10/2021) 16 g 0  ? ?No current facility-administered medications on file prior to visit.  ? ?     ROS:  All others reviewed and negative. ? ?Objective  ? ?     PE:  BP 130/70   Pulse 60   Resp 18   Ht '5\' 10"'$  (1.778 m)   Wt 253 lb (114.8 kg)   SpO2 98%   BMI 36.30 kg/m?  ? ?              Constitutional: Pt appears in NAD ?              HENT: Head: NCAT.  ?              Right Ear: External ear normal.   ?              Left Ear: External ear normal.  ?              Eyes: . Pupils are equal, round, and reactive to light. Conjunctivae and EOM are normal ?              Nose: without d/c or deformity ?              Neck: Neck supple. Gross normal ROM ?              Cardiovascular: Normal rate and regular rhythm.   ?              Pulmonary/Chest: Effort normal and breath sounds without rales or wheezing.  ?              Abd:  Soft, NT, ND, + BS, no organomegaly ?              Neurological: Pt is  alert. At baseline orientation, motor grossly intact ?              Skin: Skin is warm. No rashes, no other new lesions, LE edema - chronic 1+ LE swelling ?              Psychiatric: Pt behavior is normal wit

## 2021-09-19 DIAGNOSIS — M542 Cervicalgia: Secondary | ICD-10-CM | POA: Diagnosis not present

## 2021-09-21 ENCOUNTER — Other Ambulatory Visit: Payer: Self-pay | Admitting: Internal Medicine

## 2021-09-21 DIAGNOSIS — R7989 Other specified abnormal findings of blood chemistry: Secondary | ICD-10-CM

## 2021-09-29 DIAGNOSIS — M542 Cervicalgia: Secondary | ICD-10-CM | POA: Diagnosis not present

## 2021-10-01 ENCOUNTER — Other Ambulatory Visit: Payer: Self-pay | Admitting: Internal Medicine

## 2021-10-01 NOTE — Telephone Encounter (Signed)
Please refill as per office routine med refill policy (all routine meds to be refilled for 3 mo or monthly (per pt preference) up to one year from last visit, then month to month grace period for 3 mo, then further med refills will have to be denied) ? ?

## 2021-10-07 ENCOUNTER — Ambulatory Visit (INDEPENDENT_AMBULATORY_CARE_PROVIDER_SITE_OTHER)
Admission: RE | Admit: 2021-10-07 | Discharge: 2021-10-07 | Disposition: A | Discharge status: Home / Self Care | Payer: Self-pay | Source: Ambulatory Visit | Attending: Internal Medicine | Admitting: Internal Medicine

## 2021-10-07 ENCOUNTER — Other Ambulatory Visit: Payer: Self-pay | Admitting: Internal Medicine

## 2021-10-07 DIAGNOSIS — E7800 Pure hypercholesterolemia, unspecified: Secondary | ICD-10-CM

## 2021-10-07 DIAGNOSIS — R931 Abnormal findings on diagnostic imaging of heart and coronary circulation: Secondary | ICD-10-CM

## 2021-10-07 DIAGNOSIS — I10 Essential (primary) hypertension: Secondary | ICD-10-CM

## 2021-10-07 DIAGNOSIS — R7302 Impaired glucose tolerance (oral): Secondary | ICD-10-CM

## 2021-10-07 MED ORDER — ASPIRIN 81 MG PO TBEC: 81.0000 mg | DELAYED_RELEASE_TABLET | Freq: Every day | ORAL | 12 refills | Status: AC

## 2021-10-08 DIAGNOSIS — M542 Cervicalgia: Secondary | ICD-10-CM | POA: Diagnosis not present

## 2021-10-09 ENCOUNTER — Ambulatory Visit: Payer: Medicare PPO | Admitting: Internal Medicine

## 2021-10-09 ENCOUNTER — Encounter: Payer: Self-pay | Admitting: Internal Medicine

## 2021-10-09 VITALS — BP 130/82 | HR 52 | Ht 70.0 in | Wt 250.0 lb

## 2021-10-09 DIAGNOSIS — R079 Chest pain, unspecified: Secondary | ICD-10-CM

## 2021-10-09 NOTE — H&P (View-Only) (Signed)
?Cardiology Office Note:   ? ?Date:  10/09/2021  ? ?ID:  Warren Weeks, DOB Feb 08, 1946, MRN [id number] ? ?PCP:  Biagio Borg, MD ?  ?[facility] Providers ?Cardiologist:  None    ? ?Referring MD: Biagio Borg, MD  ? ?No chief complaint on file. ?Referred from PCP for hyperlipidemia ? ?History of Present Illness:   ?Initial Visit ?Warren Weeks is a 76 y.o. male with a hx of BL carotid stenosis not significant, HTN, HLD LDL 78 ? ?Mr. Warnell comes in to discuss cholesterol levels and risk as a referral from his PCP. He was told LDL was high and to consider lipid clinic and a CAC score was ordered. ? ?He denies angina, dyspnea on exertion, lower extremity edema, PND or orthopnea.  He feels infrequent palpitations. No syncope Notably was seen in 2014 bu Dr. Lovena Le for palpitations. Cardiac monitor did not show any significant arrhythmias. ? ?Cardiac Hx: none. No history of stroke ? ?Family Hx: Father had CHF at 23. Sister had CABG. Sister passed 8 ? ?Social Hx: quit 30 years ago. 20 years smoking. ? ?Interim Hx: ? ?Underwent CAC with total score 1273, 85th percentile for age. Predominantly in the LAD. LDL 61 mg/dL 3 weeks ago.  No chest pressure. He can feel some ectopy. With walking up steps he can feel a burning sensation in his chest and SOB. He denies PND, orthopnea. Has some leg swelling. A TTE was obtained. ? ?Past Medical History:  ?Diagnosis Date  ? ALLERGIC RHINITIS 09/16/2007  ? ALLERGY 03/24/2007  ? Allergy   ? ANXIETY 03/28/2007  ? Arthritis   ? knees, back,shoulder  ? CAROTID ARTERY STENOSIS, BILATERAL 07/01/2009  ? yearly checks - no current problems  ? CEPHALGIA 06/08/2009  ? CHEST PAIN 09/16/2007  ? no current problems  ? COLONIC POLYPS, HX OF 09/16/2007  ? COPD (chronic obstructive pulmonary disease) ([place]) 05/12/2017  ? mild per patient - no inhaler  ? Dizziness and giddiness 06/08/2009  ? 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 DYSPNEA/SHORTNESS OF BREATH 09/16/2007  ? ERECTILE DYSFUNCTION 03/28/2007  ?  ESOPHAGEAL STRICTURE 09/16/2007  ? ESOPHAGITIS 03/24/2007  ? GERD 03/24/2007  ? GLUCOSE INTOLERANCE 09/16/2007  ? HEMORRHOIDS, INTERNAL 03/24/2007  ? HYPERCHOLESTEROLEMIA 03/24/2007  ? HYPERLIPIDEMIA 09/16/2007  ? HYPERTENSION 03/28/2007  ? 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 Impaired glucose tolerance 10/04/2010  ? LOW BACK PAIN 03/28/2007  ? MAGNETIC RESONANCE IMAGING, BRAIN, ABNORMAL 06/26/2009  ? NECK PAIN, LEFT 06/20/2010  ? OBESITY 03/24/2007  ? OTITIS MEDIA, LEFT 06/10/2009  ? Palpitations 06/20/2010  ? no current problems  ? PVD 06/25/2009  ? RASH-NONVESICULAR 06/17/2009  ? URI 08/16/2009  ? ? ?Past Surgical History:  ?Procedure Laterality Date  ? COLONOSCOPY  03/2013  ? polyps/[place]  ? NASAL SEPTOPLASTY W/ TURBINOPLASTY Bilateral 01/03/2021  ? Procedure: NASAL SEPTOPLASTY WITH BILATERAL TURBINATE REDUCTION;  Surgeon: Leta Baptist, MD;  Location: [facility];  Service: ENT;  Laterality: Bilateral;  ? s/p right knee arthroscopy    ? UPPER GASTROINTESTINAL ENDOSCOPY    ? gerd  ? ? ?Current Medications: ?No outpatient medications have been marked as taking for the 10/09/21 encounter (Appointment) with Janina Mayo, MD.  ?  ? ?Allergies:   Cefuroxime axetil  ? ?Social History  ? ?Socioeconomic History  ? Marital status: Married  ?  Spouse name: Charleston Ropes  ? Number of children: 2  ? Years of education: Not on file  ? Highest education level: Not on file  ?  Occupational History  ? Occupation: ship and Engineer, water  ?  Employer: [employer]  ?Tobacco Use  ? Smoking status: Former  ?  Packs/day: 0.50  ?  Years: 15.00  ?  Pack years: 7.50  ?  Types: Cigarettes  ? Smokeless tobacco: Never  ? Tobacco comments:  ?  Quit 30 years ago  ?Vaping Use  ? Vaping Use: Never used  ?Substance and Sexual Activity  ? Alcohol use: Yes  ?  Alcohol/week: 7.0 standard drinks  ?  Types: 7 Cans of beer per week  ?  Comment: beer most days  ? Drug use: No  ? Sexual activity: Yes  ?Other Topics Concern  ? Not on file  ?Social History Narrative  ?  Not on file  ? ?Social Determinants of Health  ? ?Financial Resource Strain: Low Risk   ? Difficulty of Paying Living Expenses: Not hard at all  ?Food Insecurity: No Food Insecurity  ? Worried About Charity fundraiser in the Last Year: Never true  ? Ran Out of Food in the Last Year: Never true  ?Transportation Needs: No Transportation Needs  ? Lack of Transportation (Medical): No  ? Lack of Transportation (Non-Medical): No  ?Physical Activity: Inactive  ? Days of Exercise per Week: 0 days  ? Minutes of Exercise per Session: 0 min  ?Stress: No Stress Concern Present  ? Feeling of Stress : Not at all  ?Social Connections: Socially Integrated  ? Frequency of Communication with Friends and Family: More than three times a week  ? Frequency of Social Gatherings with Friends and Family: More than three times a week  ? Attends Religious Services: More than 4 times per year  ? Active Member of Clubs or Organizations: No  ? Attends Archivist Meetings: More than 4 times per year  ? Marital Status: Married  ?  ? ?Family History: ?The patient's family history includes Coronary artery disease in his brother and sister; Heart disease in his father; Lung cancer in his sister; Lymphoma in his mother; Ovarian cancer in his sister. There is no history of Colon cancer, Esophageal cancer, Rectal cancer, Stomach cancer, or Colon polyps. ? ?ROS:   ?Please see the history of present illness.    ? All other systems reviewed and are negative. ? ?EKGs/Labs/Other Studies Reviewed:   ? ?The following studies were reviewed today: ? ? ?EKG:  EKG is  ordered today.  The ekg ordered today demonstrates  ? ?04/10/2021-[facility] Normal EKG ? ?10/09/2021- sinus bradycardia 56 bpm,  ? ?Recent Labs: ?09/17/2021: ALT 62; BUN 16; Creatinine, Ser 1.05; Hemoglobin 13.0; Platelets 146.0; Potassium 4.8; Pro B Natriuretic peptide (BNP) 453.0; Sodium 141; TSH 1.55  ?Recent Lipid Panel ?   ?Component Value Date/Time  ? CHOL 146 09/17/2021 1421  ? TRIG 116.0  09/17/2021 1421  ? HDL 62.30 09/17/2021 1421  ? CHOLHDL 2 09/17/2021 1421  ? VLDL 23.2 09/17/2021 1421  ? Black 61 09/17/2021 1421  ? LDLDIRECT 176.0 04/03/2011 0803  ? ? ? ?Risk Assessment/Calculations:   ?  ?The 10-year ASCVD risk score (Arnett DK, et al., 2019) is: 18.5% ?  Values used to calculate the score: ?    Age: 51 years ?    Sex: Male ?    Is Non-Hispanic African American: Yes ?    Diabetic: No ?    Tobacco smoker: No ?    Systolic Blood Pressure: 825 mmHg ?    Is BP treated: Yes ?  HDL Cholesterol: 62.3 mg/dL ?    Total Cholesterol: 146 mg/dL ? ?    ? ?Physical Exam:   ? ?VS:   ? ?Vitals:  ? 10/09/21 0908  ?BP: 130/82  ?Pulse: (!) 52  ?SpO2: 97%  ? ? ? ?Wt Readings from Last 3 Encounters:  ?09/17/21 253 lb (114.8 kg)  ?08/11/21 252 lb (114.3 kg)  ?04/10/21 253 lb (114.8 kg)  ?  ? ?GEN:  Well nourished, well developed in no acute distress ?HEENT: Normal ?NECK: No JVD; ?CARDIAC: RRR, no murmurs, rubs, gallops ?RESPIRATORY:  Clear to auscultation without rales, wheezing or rhonchi  ?ABDOMEN: Soft, non-tender, non-distended ?MUSCULOSKELETAL:  No edema; No deformity  ?SKIN: Warm and dry ?NEUROLOGIC:  Alert and oriented x 3 ?PSYCHIATRIC:  Normal affect  ? ?ASSESSMENT:   ? ?#CAC: significantly elevated. LDL goal < 70 mg/dL. He is 61 mg/dL. Continue rosuvastatin 40 mg daily and zetia. He notes chest discomfort with activity, will opt for LHC. Normal renal fxn. Normal crt function. 2+ radial pulses BL ? ?The patient understands that risks included but are not limited to stroke (1 in 1000), death (1 in 9), kidney failure [usually temporary] (1 in 500), bleeding (1 in 200), allergic reaction [possibly serious] (1 in 200).   ? ?HTN: continue HCTz 25 mg daily. Continue metop tartrate 50 mg BID. Continue lisinopril 20 mg daily. ? ? ?PLAN:   ? ?In order of problems listed above: ? ? ?LHC  ?Follow up in 3 months ? ? ? ?Medication Adjustments/Labs and Tests Ordered: ?Current medicines are reviewed at length with  the patient today.  Concerns regarding medicines are outlined above.  ? ? ?Signed, ?Janina Mayo, MD  ?10/09/2021 8:40 AM    ?[place] ? ?

## 2021-10-09 NOTE — Patient Instructions (Signed)
Medication Instructions:  ?No Changes In Medications at this time.  ?*If you need a refill on your cardiac medications before your next appointment, please call your pharmacy* ? ?Lab Work: ?None Ordered At This Time.  ?If you have labs (blood work) drawn today and your tests are completely normal, you will receive your results only by: ?MyChart Message (if you have MyChart) OR ?A paper copy in the mail ?If you have any lab test that is abnormal or we need to change your treatment, we will call you to review the results. ? ?Testing/Procedures: ?Your physician has requested that you have a cardiac catheterization. Cardiac catheterization is used to diagnose and/or treat various heart conditions. Doctors may recommend this procedure for a number of different reasons. The most common reason is to evaluate chest pain. Chest pain can be a symptom of coronary artery disease (CAD), and cardiac catheterization can show whether plaque is narrowing or blocking your heart?s arteries. This procedure is also used to evaluate the valves, as well as measure the blood flow and oxygen levels in different parts of your heart. For further information please visit [email or web address]. Please follow instruction sheet, as given. ? ?Follow-Up: ?At [facility], you and your health needs are our priority.  As part of our continuing mission to provide you with exceptional heart care, we have created designated Provider Care Teams.  These Care Teams include your primary Cardiologist (physician) and Advanced Practice Providers (APPs -  Physician Assistants and Nurse Practitioners) who all work together to provide you with the care you need, when you need it. ? ?Your next appointment:   ?3 month(s) ? ?The format for your next appointment:   ?In Person ? ?Provider:   ?Janina Mayo, MD   ? ?Other Instructions ? ?[facility] [address] ?[place]  [address] ?Dept: [phone] ?Loc: [phone] ? ?Doss Cybulski Waldren  10/09/2021 ? ?You are scheduled for a Cardiac Catheterization on Tuesday, April 25 with Dr. [name]. ? ?1. Please arrive at the Main Entrance A at [facility]: [place], [place] [address] at 5:30 AM (This time is two hours before your procedure to ensure your preparation). Free valet parking service is available.  ? ?Special note: Every effort is made to have your procedure done on time. Please understand that emergencies sometimes delay scheduled procedures. ? ?2. Diet: Do not eat solid foods after midnight.  You may have clear liquids until 5 AM upon the day of the procedure. ? ?4. Medication instructions in preparation for your procedure: ? ? Contrast Allergy: No ? ?DO NOT TAKE HCTZ THE MORNING OF PROCEDURE ? ?On the morning of your procedure, take Aspirin and any morning medicines NOT listed above.  You may use sips of water. ? ?5. Plan to go home the same day, you will only stay overnight if medically necessary. ?6. You MUST have a responsible adult to drive you home. ?7. An adult MUST be with you the first 24 hours after you arrive home. ?8. Bring a current list of your medications, and the last time and date medication taken. ?9. Bring ID and current insurance cards. ?10.Please wear clothes that are easy to get on and off and wear slip-on shoes. ? ?Thank you for allowing [place] to care for you! ?  -- [facility] ? ? ? ? ? ?  ?

## 2021-10-09 NOTE — Progress Notes (Signed)
?Cardiology Office Note:   ? ?Date:  10/09/2021  ? ?ID:  [name], DOB 01/07/1946, MRN [id number] ? ?PCP:  Biagio Borg, MD ?  ?[facility] Providers ?Cardiologist:  None    ? ?Referring MD: Biagio Borg, MD  ? ?No chief complaint on file. ?Referred from PCP for hyperlipidemia ? ?History of Present Illness:   ?Initial Visit ?Warren Weeks is a 76 y.o. male with a hx of BL carotid stenosis not significant, HTN, HLD LDL 78 ? ?[name] comes in to discuss cholesterol levels and risk as a referral from his PCP. He was told LDL was high and to consider lipid clinic and a CAC score was ordered. ? ?He denies angina, dyspnea on exertion, lower extremity edema, PND or orthopnea.  He feels infrequent palpitations. No syncope Notably was seen in 2014 bu Dr. Lovena Le for palpitations. Cardiac monitor did not show any significant arrhythmias. ? ?Cardiac Hx: none. No history of stroke ? ?Family Hx: Father had CHF at 88. Sister had CABG. Sister passed 11 ? ?Social Hx: quit 30 years ago. 20 years smoking. ? ?Interim Hx: ? ?Underwent CAC with total score 1273, 85th percentile for age. Predominantly in the LAD. LDL 61 mg/dL 3 weeks ago.  No chest pressure. He can feel some ectopy. With walking up steps he can feel a burning sensation in his chest and SOB. He denies PND, orthopnea. Has some leg swelling. A TTE was obtained. ? ?Past Medical History:  ?Diagnosis Date  ? ALLERGIC RHINITIS 09/16/2007  ? ALLERGY 03/24/2007  ? Allergy   ? ANXIETY 03/28/2007  ? Arthritis   ? knees, back,shoulder  ? CAROTID ARTERY STENOSIS, BILATERAL 07/01/2009  ? yearly checks - no current problems  ? CEPHALGIA 06/08/2009  ? CHEST PAIN 09/16/2007  ? no current problems  ? COLONIC POLYPS, HX OF 09/16/2007  ? COPD (chronic obstructive pulmonary disease) ([place]) 05/12/2017  ? mild per patient - no inhaler  ? Dizziness and giddiness 06/08/2009  ? 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 DYSPNEA/SHORTNESS OF BREATH 09/16/2007  ? ERECTILE DYSFUNCTION 03/28/2007  ?  ESOPHAGEAL STRICTURE 09/16/2007  ? ESOPHAGITIS 03/24/2007  ? GERD 03/24/2007  ? GLUCOSE INTOLERANCE 09/16/2007  ? HEMORRHOIDS, INTERNAL 03/24/2007  ? HYPERCHOLESTEROLEMIA 03/24/2007  ? HYPERLIPIDEMIA 09/16/2007  ? HYPERTENSION 03/28/2007  ? 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 Impaired glucose tolerance 10/04/2010  ? LOW BACK PAIN 03/28/2007  ? MAGNETIC RESONANCE IMAGING, BRAIN, ABNORMAL 06/26/2009  ? NECK PAIN, LEFT 06/20/2010  ? OBESITY 03/24/2007  ? OTITIS MEDIA, LEFT 06/10/2009  ? Palpitations 06/20/2010  ? no current problems  ? PVD 06/25/2009  ? RASH-NONVESICULAR 06/17/2009  ? URI 08/16/2009  ? ? ?Past Surgical History:  ?Procedure Laterality Date  ? COLONOSCOPY  03/2013  ? polyps/[place]  ? NASAL SEPTOPLASTY W/ TURBINOPLASTY Bilateral 01/03/2021  ? Procedure: NASAL SEPTOPLASTY WITH BILATERAL TURBINATE REDUCTION;  Surgeon: Leta Baptist, MD;  Location: [facility];  Service: ENT;  Laterality: Bilateral;  ? s/p right knee arthroscopy    ? UPPER GASTROINTESTINAL ENDOSCOPY    ? gerd  ? ? ?Current Medications: ?No outpatient medications have been marked as taking for the 10/09/21 encounter (Appointment) with Janina Mayo, MD.  ?  ? ?Allergies:   Cefuroxime axetil  ? ?Social History  ? ?Socioeconomic History  ? Marital status: Married  ?  Spouse name: Charleston Ropes  ? Number of children: 2  ? Years of education: Not on file  ? Highest education level: Not on file  ?  Occupational History  ? Occupation: ship and Engineer, water  ?  Employer: [employer]  ?Tobacco Use  ? Smoking status: Former  ?  Packs/day: 0.50  ?  Years: 15.00  ?  Pack years: 7.50  ?  Types: Cigarettes  ? Smokeless tobacco: Never  ? Tobacco comments:  ?  Quit 30 years ago  ?Vaping Use  ? Vaping Use: Never used  ?Substance and Sexual Activity  ? Alcohol use: Yes  ?  Alcohol/week: 7.0 standard drinks  ?  Types: 7 Cans of beer per week  ?  Comment: beer most days  ? Drug use: No  ? Sexual activity: Yes  ?Other Topics Concern  ? Not on file  ?Social History Narrative  ?  Not on file  ? ?Social Determinants of Health  ? ?Financial Resource Strain: Low Risk   ? Difficulty of Paying Living Expenses: Not hard at all  ?Food Insecurity: No Food Insecurity  ? Worried About Charity fundraiser in the Last Year: Never true  ? Ran Out of Food in the Last Year: Never true  ?Transportation Needs: No Transportation Needs  ? Lack of Transportation (Medical): No  ? Lack of Transportation (Non-Medical): No  ?Physical Activity: Inactive  ? Days of Exercise per Week: 0 days  ? Minutes of Exercise per Session: 0 min  ?Stress: No Stress Concern Present  ? Feeling of Stress : Not at all  ?Social Connections: Socially Integrated  ? Frequency of Communication with Friends and Family: More than three times a week  ? Frequency of Social Gatherings with Friends and Family: More than three times a week  ? Attends Religious Services: More than 4 times per year  ? Active Member of Clubs or Organizations: No  ? Attends Archivist Meetings: More than 4 times per year  ? Marital Status: Married  ?  ? ?Family History: ?The patient's family history includes Coronary artery disease in his brother and sister; Heart disease in his father; Lung cancer in his sister; Lymphoma in his mother; Ovarian cancer in his sister. There is no history of Colon cancer, Esophageal cancer, Rectal cancer, Stomach cancer, or Colon polyps. ? ?ROS:   ?Please see the history of present illness.    ? All other systems reviewed and are negative. ? ?EKGs/Labs/Other Studies Reviewed:   ? ?The following studies were reviewed today: ? ? ?EKG:  EKG is  ordered today.  The ekg ordered today demonstrates  ? ?04/10/2021-[facility] Normal EKG ? ?10/09/2021- sinus bradycardia 56 bpm,  ? ?Recent Labs: ?09/17/2021: ALT 62; BUN 16; Creatinine, Ser 1.05; Hemoglobin 13.0; Platelets 146.0; Potassium 4.8; Pro B Natriuretic peptide (BNP) 453.0; Sodium 141; TSH 1.55  ?Recent Lipid Panel ?   ?Component Value Date/Time  ? CHOL 146 09/17/2021 1421  ? TRIG 116.0  09/17/2021 1421  ? HDL 62.30 09/17/2021 1421  ? CHOLHDL 2 09/17/2021 1421  ? VLDL 23.2 09/17/2021 1421  ? [place] 61 09/17/2021 1421  ? LDLDIRECT 176.0 04/03/2011 0803  ? ? ? ?Risk Assessment/Calculations:   ?  ?The 10-year ASCVD risk score (Arnett DK, et al., 2019) is: 18.5% ?  Values used to calculate the score: ?    Age: 24 years ?    Sex: Male ?    Is Non-Hispanic African American: Yes ?    Diabetic: No ?    Tobacco smoker: No ?    Systolic Blood Pressure: 151 mmHg ?    Is BP treated: Yes ?  HDL Cholesterol: 62.3 mg/dL ?    Total Cholesterol: 146 mg/dL ? ?    ? ?Physical Exam:   ? ?VS:   ? ?Vitals:  ? 10/09/21 0908  ?BP: 130/82  ?Pulse: (!) 52  ?SpO2: 97%  ? ? ? ?Wt Readings from Last 3 Encounters:  ?09/17/21 253 lb (114.8 kg)  ?08/11/21 252 lb (114.3 kg)  ?04/10/21 253 lb (114.8 kg)  ?  ? ?GEN:  Well nourished, well developed in no acute distress ?HEENT: Normal ?NECK: No JVD; ?CARDIAC: RRR, no murmurs, rubs, gallops ?RESPIRATORY:  Clear to auscultation without rales, wheezing or rhonchi  ?ABDOMEN: Soft, non-tender, non-distended ?MUSCULOSKELETAL:  No edema; No deformity  ?SKIN: Warm and dry ?NEUROLOGIC:  Alert and oriented x 3 ?PSYCHIATRIC:  Normal affect  ? ?ASSESSMENT:   ? ?#CAC: significantly elevated. LDL goal < 70 mg/dL. He is 61 mg/dL. Continue rosuvastatin 40 mg daily and zetia. He notes chest discomfort with activity, will opt for LHC. Normal renal fxn. Normal crt function. 2+ radial pulses BL ? ?The patient understands that risks included but are not limited to stroke (1 in 1000), death (1 in 8), kidney failure [usually temporary] (1 in 500), bleeding (1 in 200), allergic reaction [possibly serious] (1 in 200).   ? ?HTN: continue HCTz 25 mg daily. Continue metop tartrate 50 mg BID. Continue lisinopril 20 mg daily. ? ? ?PLAN:   ? ?In order of problems listed above: ? ? ?LHC  ?Follow up in 3 months ? ? ? ?Medication Adjustments/Labs and Tests Ordered: ?Current medicines are reviewed at length with  the patient today.  Concerns regarding medicines are outlined above.  ? ? ?Signed, ?Janina Mayo, MD  ?10/09/2021 8:40 AM    ?[place] ? ?

## 2021-10-13 ENCOUNTER — Telehealth: Payer: Self-pay | Admitting: *Deleted

## 2021-10-13 NOTE — Telephone Encounter (Signed)
Cardiac Catheterization scheduled at [facility] for: Tuesday October 14, 2021 7:30 AM ?Arrival time and place: [place] Entrance A at: 5:30 AM ? ? ?No solid food after midnight prior to cath, clear liquids until 5 AM day of procedure. ? ?Medication instructions: ?-Hold: ? HCTZ-AM of procedure  ?-Except hold medication usual morning medications can be taken with sips of water including aspirin 81 mg. ? ?Confirmed patient has responsible adult to drive home post procedure and be with patient first 24 hours after arriving home. ? ?Patient reports no new symptoms concerning for COVID-19/no exposure to COVID-19 in the past 10 days. ? ?Reviewed procedure instructions with patient.  ? ?

## 2021-10-14 ENCOUNTER — Encounter (HOSPITAL_COMMUNITY): Payer: Self-pay | Admitting: Interventional Cardiology

## 2021-10-14 ENCOUNTER — Other Ambulatory Visit: Payer: Self-pay

## 2021-10-14 ENCOUNTER — Ambulatory Visit (HOSPITAL_COMMUNITY)
Admission: RE | Admit: 2021-10-14 | Discharge: 2021-10-14 | Disposition: A | Discharge status: Home / Self Care | Payer: Medicare PPO | Attending: Interventional Cardiology | Admitting: Interventional Cardiology

## 2021-10-14 ENCOUNTER — Other Ambulatory Visit: Payer: Self-pay | Admitting: Internal Medicine

## 2021-10-14 ENCOUNTER — Encounter (HOSPITAL_COMMUNITY)
Admission: RE | Disposition: A | Discharge status: Home / Self Care | Payer: Self-pay | Source: Home / Self Care | Attending: Interventional Cardiology

## 2021-10-14 DIAGNOSIS — E785 Hyperlipidemia, unspecified: Secondary | ICD-10-CM | POA: Insufficient documentation

## 2021-10-14 DIAGNOSIS — Z79899 Other long term (current) drug therapy: Secondary | ICD-10-CM | POA: Insufficient documentation

## 2021-10-14 DIAGNOSIS — I10 Essential (primary) hypertension: Secondary | ICD-10-CM | POA: Diagnosis not present

## 2021-10-14 DIAGNOSIS — I2510 Atherosclerotic heart disease of native coronary artery without angina pectoris: Secondary | ICD-10-CM

## 2021-10-14 DIAGNOSIS — Z8249 Family history of ischemic heart disease and other diseases of the circulatory system: Secondary | ICD-10-CM | POA: Insufficient documentation

## 2021-10-14 DIAGNOSIS — Z87891 Personal history of nicotine dependence: Secondary | ICD-10-CM | POA: Diagnosis not present

## 2021-10-14 DIAGNOSIS — I2584 Coronary atherosclerosis due to calcified coronary lesion: Secondary | ICD-10-CM | POA: Diagnosis not present

## 2021-10-14 HISTORY — PX: LEFT HEART CATH AND CORONARY ANGIOGRAPHY: CATH118249

## 2021-10-14 SURGERY — LEFT HEART CATH AND CORONARY ANGIOGRAPHY
Anesthesia: LOCAL

## 2021-10-14 MED ORDER — HEPARIN SODIUM (PORCINE) 1000 UNIT/ML IJ SOLN
INTRAMUSCULAR | Status: AC
  Filled 2021-10-14: qty 10

## 2021-10-14 MED ORDER — ONDANSETRON HCL 4 MG/2ML IJ SOLN: 4.0000 mg | Freq: Four times a day (QID) | INTRAMUSCULAR | Status: DC | PRN

## 2021-10-14 MED ORDER — LABETALOL HCL 5 MG/ML IV SOLN: 10.0000 mg | INTRAVENOUS | Status: DC | PRN

## 2021-10-14 MED ORDER — VERAPAMIL HCL 2.5 MG/ML IV SOLN
INTRAVENOUS | Status: DC | PRN
  Administered 2021-10-14: 10 mL via INTRA_ARTERIAL

## 2021-10-14 MED ORDER — SODIUM CHLORIDE 0.9% FLUSH: 3.0000 mL | INTRAVENOUS | Status: DC | PRN

## 2021-10-14 MED ORDER — SODIUM CHLORIDE 0.9 % WEIGHT BASED INFUSION
3.0000 mL/kg/h | INTRAVENOUS | Status: AC
  Administered 2021-10-14: 3 mL/kg/h via INTRAVENOUS

## 2021-10-14 MED ORDER — HEPARIN (PORCINE) IN NACL 1000-0.9 UT/500ML-% IV SOLN
INTRAVENOUS | Status: DC | PRN
  Administered 2021-10-14 (×2): 500 mL

## 2021-10-14 MED ORDER — SODIUM CHLORIDE 0.9% FLUSH: 3.0000 mL | Freq: Two times a day (BID) | INTRAVENOUS | Status: DC

## 2021-10-14 MED ORDER — VERAPAMIL HCL 2.5 MG/ML IV SOLN
INTRAVENOUS | Status: AC
  Filled 2021-10-14: qty 2

## 2021-10-14 MED ORDER — SODIUM CHLORIDE 0.9 % IV SOLN: INTRAVENOUS | Status: AC

## 2021-10-14 MED ORDER — FENTANYL CITRATE (PF) 100 MCG/2ML IJ SOLN
INTRAMUSCULAR | Status: AC
  Filled 2021-10-14: qty 2

## 2021-10-14 MED ORDER — LIDOCAINE HCL (PF) 1 % IJ SOLN
INTRAMUSCULAR | Status: AC
Start: 2021-10-14 — End: ?
  Filled 2021-10-14: qty 30

## 2021-10-14 MED ORDER — HEPARIN (PORCINE) IN NACL 1000-0.9 UT/500ML-% IV SOLN
INTRAVENOUS | Status: AC
  Filled 2021-10-14: qty 500

## 2021-10-14 MED ORDER — MIDAZOLAM HCL 2 MG/2ML IJ SOLN
INTRAMUSCULAR | Status: DC | PRN
Start: 2021-10-14 — End: 2021-10-14
  Administered 2021-10-14: 1 mg via INTRAVENOUS
  Administered 2021-10-14: 2 mg via INTRAVENOUS

## 2021-10-14 MED ORDER — FENTANYL CITRATE (PF) 100 MCG/2ML IJ SOLN
INTRAMUSCULAR | Status: DC | PRN
  Administered 2021-10-14 (×2): 25 ug via INTRAVENOUS

## 2021-10-14 MED ORDER — MIDAZOLAM HCL 2 MG/2ML IJ SOLN
INTRAMUSCULAR | Status: AC
  Filled 2021-10-14: qty 2

## 2021-10-14 MED ORDER — HYDRALAZINE HCL 20 MG/ML IJ SOLN: 10.0000 mg | INTRAMUSCULAR | Status: DC | PRN

## 2021-10-14 MED ORDER — LIDOCAINE HCL (PF) 1 % IJ SOLN
INTRAMUSCULAR | Status: DC | PRN
  Administered 2021-10-14: 2 mL

## 2021-10-14 MED ORDER — HEPARIN SODIUM (PORCINE) 1000 UNIT/ML IJ SOLN
INTRAMUSCULAR | Status: DC | PRN
  Administered 2021-10-14: 5500 [IU] via INTRAVENOUS

## 2021-10-14 MED ORDER — SODIUM CHLORIDE 0.9 % IV SOLN: 250.0000 mL | INTRAVENOUS | Status: DC | PRN

## 2021-10-14 MED ORDER — ACETAMINOPHEN 325 MG PO TABS: 650.0000 mg | ORAL_TABLET | ORAL | Status: DC | PRN

## 2021-10-14 MED ORDER — SODIUM CHLORIDE 0.9 % WEIGHT BASED INFUSION: 1.0000 mL/kg/h | INTRAVENOUS | Status: DC

## 2021-10-14 MED ORDER — ASPIRIN 81 MG PO CHEW: 81.0000 mg | CHEWABLE_TABLET | ORAL | Status: DC

## 2021-10-14 SURGICAL SUPPLY — 9 items

## 2021-10-14 NOTE — Interval H&P Note (Signed)
Cath Lab Visit (complete for each Cath Lab visit) ? ?Clinical Evaluation Leading to the Procedure:  ? ?ACS: No. ? ?Non-ACS:   ? ?Anginal Classification: CCS III ? ?Anti-ischemic medical therapy: Minimal Therapy (1 class of medications) ? ?Non-Invasive Test Results: No non-invasive testing performed ? ?Prior CABG: No previous CABG ? ? ? ? ? ?History and Physical Interval Note: ? ?10/14/2021 ?7:39 AM ? ?Warren Weeks  has presented today for surgery, with the diagnosis of chest pain, elevated calcium scores.  The various methods of treatment have been discussed with the patient and family. After consideration of risks, benefits and other options for treatment, the patient has consented to  Procedure(s): ?LEFT HEART CATH AND CORONARY ANGIOGRAPHY (N/A) as a surgical intervention.  The patient's history has been reviewed, patient examined, no change in status, stable for surgery.  I have reviewed the patient's chart and labs.  Questions were answered to the patient's satisfaction.   ? ? ?Warren Weeks ? ? ?

## 2021-10-14 NOTE — Telephone Encounter (Signed)
Please refill as per office routine med refill policy (all routine meds to be refilled for 3 mo or monthly (per pt preference) up to one year from last visit, then month to month grace period for 3 mo, then further med refills will have to be denied) ? ?

## 2021-10-14 NOTE — Discharge Instructions (Signed)

## 2021-10-14 NOTE — Progress Notes (Signed)
Patient and friend was given discharge instructions. Both verbalized understanding. 

## 2021-10-17 ENCOUNTER — Other Ambulatory Visit (HOSPITAL_COMMUNITY): Payer: Medicare PPO

## 2021-10-17 ENCOUNTER — Encounter: Payer: Self-pay | Admitting: Primary Care

## 2021-10-17 ENCOUNTER — Ambulatory Visit (HOSPITAL_COMMUNITY): Payer: Medicare PPO

## 2021-10-17 ENCOUNTER — Ambulatory Visit (INDEPENDENT_AMBULATORY_CARE_PROVIDER_SITE_OTHER): Payer: Medicare PPO | Admitting: Primary Care

## 2021-10-17 VITALS — BP 128/80 | HR 61 | Temp 98.3°F | Ht 69.0 in | Wt 251.0 lb

## 2021-10-17 DIAGNOSIS — G4700 Insomnia, unspecified: Secondary | ICD-10-CM

## 2021-10-17 DIAGNOSIS — R0683 Snoring: Secondary | ICD-10-CM

## 2021-10-17 DIAGNOSIS — J449 Chronic obstructive pulmonary disease, unspecified: Secondary | ICD-10-CM | POA: Diagnosis not present

## 2021-10-17 NOTE — Progress Notes (Signed)
? ?'@Patient'$  ID: Warren Weeks, male    DOB: [birth year], 76 y.o.   MRN: [id number] ? ?Chief Complaint  ?Patient presents with  ? Consult  ?  He feels that he has trouble sleeping and going to sleep, He feels tired during the day.   ? ? ?Referring provider: ?Biagio Borg, MD ? ?HPI: ?76 year old male, former smoker quit 1993.  Past medical history significant for COPD, chronic sinusitis, coronary artery calcification, hypertension, GERD, diastolic dysfunction, obesity. ? ?10/17/2021 ?Patient presents today for sleep consult. Patient has symptoms of insomnia, gasping for air at night, snoring and daytime sleepiness. He reports having trouble falling and staying asleep. Most nights it takes him 20-30 mins to fall asleep, on occasional he might have a night where he is unable to fall asleep. He feels tired all the time. He takes '10mg'$  melatonin. He had sinus surgery back in July 2022. He gets short winded walking up steps with associated chest tightness. He tells me that he thinks he has copd. Denies cough.  ? ?Sleep questionnaire ?Symptoms-   Insomnia, restless sleep, gasping for air at night, snoring, daytime sleepiness  ?Prior sleep study- None  ?Bedtime- 11-12am ?Time to fall asleep- 20-30 mins (occasionally cant not sleep)  ?Nocturnal awakenings- three times  ?Out of bed/start of day- 9-10am  ?Weight changes- 4 lbs  ?Do you operate heavy machinery- No  ?Do you currently wear CPAP- No ?Do you current wear oxygen- No ?Epworth- 7 ? ?Allergies  ?Allergen Reactions  ? Cefuroxime Axetil Rash  ? ? ?Immunization History  ?Administered Date(s) Administered  ? Fluad Quad(high Dose 65+) 02/21/2019, 03/12/2021  ? H1N1 04/22/2008  ? Influenza Whole 06/10/2009  ? Influenza, High Dose Seasonal PF 03/20/2016, 03/16/2017, 03/16/2018  ? Influenza,inj,Quad PF,6+ Mos 04/10/2015  ? Influenza-Unspecified 03/22/2012, 09/03/2014, 04/22/2020  ? PFIZER(Purple Top)SARS-COV-2 Vaccination 07/24/2019, 08/21/2019, 06/24/2020  ? Pneumococcal  Conjugate-13 12/12/2013  ? Pneumococcal Polysaccharide-23 12/14/2014  ? Td 10/03/2008  ? Tdap 12/07/2018  ? Zoster Recombinat (Shingrix) 09/24/2017, 12/20/2017  ? Zoster, Live 10/12/2012  ? ? ?Past Medical History:  ?Diagnosis Date  ? ALLERGIC RHINITIS 09/16/2007  ? ALLERGY 03/24/2007  ? Allergy   ? ANXIETY 03/28/2007  ? Arthritis   ? knees, back,shoulder  ? CAROTID ARTERY STENOSIS, BILATERAL 07/01/2009  ? yearly checks - no current problems  ? CEPHALGIA 06/08/2009  ? CHEST PAIN 09/16/2007  ? no current problems  ? COLONIC POLYPS, HX OF 09/16/2007  ? COPD (chronic obstructive pulmonary disease) ([place]) 05/12/2017  ? mild per patient - no inhaler  ? Dizziness and giddiness 06/08/2009  ? 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 DYSPNEA/SHORTNESS OF BREATH 09/16/2007  ? ERECTILE DYSFUNCTION 03/28/2007  ? ESOPHAGEAL STRICTURE 09/16/2007  ? ESOPHAGITIS 03/24/2007  ? GERD 03/24/2007  ? GLUCOSE INTOLERANCE 09/16/2007  ? HEMORRHOIDS, INTERNAL 03/24/2007  ? HYPERCHOLESTEROLEMIA 03/24/2007  ? HYPERLIPIDEMIA 09/16/2007  ? HYPERTENSION 03/28/2007  ? 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 Impaired glucose tolerance 10/04/2010  ? LOW BACK PAIN 03/28/2007  ? MAGNETIC RESONANCE IMAGING, BRAIN, ABNORMAL 06/26/2009  ? NECK PAIN, LEFT 06/20/2010  ? OBESITY 03/24/2007  ? OTITIS MEDIA, LEFT 06/10/2009  ? Palpitations 06/20/2010  ? no current problems  ? PVD 06/25/2009  ? RASH-NONVESICULAR 06/17/2009  ? URI 08/16/2009  ? ? ?Tobacco History: ?Social History  ? ?Tobacco Use  ?Smoking Status Former  ? Packs/day: 0.50  ? Years: 15.00  ? Pack years: 7.50  ? Types: Cigarettes  ? Quit date: 06/23/1991  ? Years since quitting: 30.3  ?Smokeless  Tobacco Never  ?Tobacco Comments  ? Quit 30 years ago  ? ?Counseling given: Not Answered ?Tobacco comments: Quit 30 years ago ? ? ?Outpatient Medications Prior to Visit  ?Medication Sig Dispense Refill  ? acetaminophen (TYLENOL) 650 MG CR tablet Take 650 mg by mouth every 8 (eight) hours as needed for pain.    ? aspirin 81 MG EC tablet Take 1  tablet (81 mg total) by mouth daily. Swallow whole. 30 tablet 12  ? azelastine (OPTIVAR) 0.05 % ophthalmic solution Place 1 drop into both eyes 2 (two) times daily. 6 mL 12  ? cholecalciferol (VITAMIN D3) 25 MCG (1000 UNIT) tablet Take 1,000 Units by mouth daily.    ? Cyanocobalamin (B-12 PO) Take 1 tablet by mouth daily.    ? diclofenac Sodium (VOLTAREN) 1 % GEL Apply 2 g topically daily as needed (knee pain).    ? Diclofenac Sodium CR 100 MG 24 hr tablet Take 100 mg by mouth daily.    ? ezetimibe (ZETIA) 10 MG tablet Take 1 tablet by mouth once daily 90 tablet 3  ? fluticasone (FLONASE) 50 MCG/ACT nasal spray Use 2 spray(s) in each nostril once daily (Patient taking differently: Place 2 sprays into both nostrils daily as needed for allergies.) 16 g 0  ? hydrochlorothiazide (HYDRODIURIL) 25 MG tablet Take 1 tablet (25 mg total) by mouth daily. (Patient taking differently: Take 25 mg by mouth daily as needed.) 90 tablet 3  ? lisinopril (ZESTRIL) 20 MG tablet Take 1 tablet by mouth once daily (Patient taking differently: Take 20 mg by mouth daily.) 90 tablet 3  ? Melatonin 10 MG TABS Take 10 mg by mouth at bedtime.     ? methocarbamol (ROBAXIN) 500 MG tablet Take 1 tablet (500 mg total) by mouth 2 (two) times daily as needed. (Patient taking differently: Take 500 mg by mouth 2 (two) times daily as needed for muscle spasms.) 40 tablet 2  ? metoprolol tartrate (LOPRESSOR) 50 MG tablet Take 1 tablet by mouth twice daily (Patient taking differently: Take 50 mg by mouth 2 (two) times daily.) 180 tablet 0  ? Multiple Vitamin (ONE-A-DAY 55 PLUS PO) Take 1 tablet by mouth daily.    ? Omega-3 Fatty Acids (FISH OIL) 1000 MG CAPS Take 1,000 mg by mouth daily.    ? omeprazole (PRILOSEC) 20 MG capsule Take 1 capsule by mouth twice daily 180 capsule 3  ? rosuvastatin (CRESTOR) 40 MG tablet Take 1 tablet by mouth once daily 90 tablet 3  ? sildenafil (VIAGRA) 100 MG tablet Take 1 tablet (100 mg total) by mouth as needed for erectile  dysfunction. 10 tablet 11  ? solifenacin (VESICARE) 5 MG tablet Take 1 tablet (5 mg total) by mouth daily. (Patient taking differently: Take 5 mg by mouth daily as needed (bladder).) 90 tablet 3  ? terazosin (HYTRIN) 5 MG capsule Take 5 mg by mouth at bedtime.     ? Testosterone 20.25 MG/1.25GM (1.62%) GEL Apply 2 Packages topically daily.    ? thiamine 250 MG tablet Take 250 mg by mouth daily.    ? triamcinolone cream (KENALOG) 0.5 % Apply 1 application topically 3 (three) times daily. (Patient taking differently: Apply 1 application. topically 2 (two) times daily as needed (rash).) 30 g 1  ? Turmeric 500 MG TABS Take 500 mg by mouth daily.    ? ?No facility-administered medications prior to visit.  ? ?Review of Systems ? ?Review of Systems  ?Constitutional: Negative.   ?HENT: Negative.    ?  Respiratory:  Positive for shortness of breath. Negative for cough and wheezing.   ?Cardiovascular: Negative.   ? ? ?Physical Exam ? ?BP 128/80 (BP Location: Left Arm, Cuff Size: Large)   Pulse 61   Temp 98.3 ?F (36.8 ?C) (Oral)   Ht '5\' 9"'$  (1.753 m)   Wt 251 lb (113.9 kg)   SpO2 96%   BMI 37.07 kg/m?  ?Physical Exam ?Constitutional:   ?   Appearance: Normal appearance.  ?HENT:  ?   Head: Normocephalic and atraumatic.  ?   Mouth/Throat:  ?   Mouth: Mucous membranes are moist.  ?   Pharynx: Oropharynx is clear.  ?Cardiovascular:  ?   Rate and Rhythm: Normal rate and regular rhythm.  ?Pulmonary:  ?   Effort: Pulmonary effort is normal.  ?   Breath sounds: Normal breath sounds. No wheezing or rhonchi.  ?Musculoskeletal:     ?   General: Normal range of motion.  ?Skin: ?   General: Skin is warm and dry.  ?Neurological:  ?   General: No focal deficit present.  ?   Mental Status: He is alert and oriented to person, place, and time. Mental status is at baseline.  ?Psychiatric:     ?   Mood and Affect: Mood normal.     ?   Behavior: Behavior normal.     ?   Thought Content: Thought content normal.     ?   Judgment: Judgment normal.   ?  ? ?Lab Results: ? ?CBC ?   ?Component Value Date/Time  ? WBC 6.1 09/17/2021 1421  ? RBC 4.35 09/17/2021 1421  ? HGB 13.0 09/17/2021 1421  ? HCT 39.5 09/17/2021 1421  ? PLT 146.0 (L) 09/17/2021 1421  ? MCV 90.8 0

## 2021-10-17 NOTE — Assessment & Plan Note (Signed)
-   Patient has difficulty initiating and maintaining sleep. On average it takes him 20-30 mins to fall asleep, however, there are some night he does not sleep. Continue melatonin '10mg'$  at bedtime. We can discuss alternative sleep aids if needed after sleep study.  ?

## 2021-10-17 NOTE — Patient Instructions (Addendum)
Sleep apnea is defined as period of 10 seconds or longer when you stop breathing at night. This can happen multiple times a night. Dx sleep apnea is when this occurs more than 5 times an hour.  ?  ?Mild OSA 5-15 apneic events an hour ?Moderate OSA 15-30 apneic events an hour ?Severe OSA > 30 apneic events an hour ?  ?Untreated sleep apnea puts you at higher risk for cardiac arrhythmias, pulmonary HTN, stroke and diabetes ?  ?Treatment options include weight loss, side sleeping position, oral appliance, CPAP therapy or referral to ENT for possible surgical options  ?  ?Recommendations: ?Focus on side sleeping position ?Work on weight loss efforts  ?Do not drive if experiencing excessive daytime sleepiness of fatigue  ?  ?Orders: ?Home sleep study re: snoring  ?Pulmonary function testing re: shortness of breath  ?  ?Follow-up: ?6 week follow-up with Beth NP (1 hour PFT prior) ? ?

## 2021-10-17 NOTE — Assessment & Plan Note (Addendum)
-   Patient has symptoms of insomnia, restless sleep, gasping for air, loud snoring, daytime sleepiness.  Epworth 7. BMI 37.  Concern patient could have obstructive sleep apnea, needs home sleep study to evaluate. Discussed risk of untreated sleep apnea including cardiac arrhythmias, pulm HTN, stroke, DM. We briefly reviewed treatment options. Encouraged patient to work on weight loss efforts and focus on side sleeping position/elevate head of bed. Advised against driving if experiencing excessive daytime sleepiness. Follow-up in 4-6 weeks to review sleep study results and discuss treatment options further. ? ?

## 2021-10-22 ENCOUNTER — Ambulatory Visit (HOSPITAL_COMMUNITY): Payer: Medicare PPO | Attending: Cardiovascular Disease

## 2021-10-22 DIAGNOSIS — R7989 Other specified abnormal findings of blood chemistry: Secondary | ICD-10-CM | POA: Diagnosis not present

## 2021-10-22 DIAGNOSIS — I5030 Unspecified diastolic (congestive) heart failure: Secondary | ICD-10-CM | POA: Diagnosis not present

## 2021-10-22 LAB — ECHOCARDIOGRAM COMPLETE
Area-P 1/2: 5.88 cm2
S' Lateral: 3.6 cm

## 2021-10-22 MED ORDER — PERFLUTREN LIPID MICROSPHERE
1.0000 mL | INTRAVENOUS | Status: AC | PRN
  Administered 2021-10-22: 1 mL via INTRAVENOUS

## 2021-12-02 ENCOUNTER — Other Ambulatory Visit: Payer: Self-pay | Admitting: Internal Medicine

## 2021-12-02 ENCOUNTER — Ambulatory Visit: Payer: Medicare PPO | Admitting: Internal Medicine

## 2021-12-02 ENCOUNTER — Encounter: Payer: Self-pay | Admitting: Internal Medicine

## 2021-12-02 VITALS — BP 126/60 | HR 74 | Temp 98.1°F | Ht 69.0 in | Wt 250.0 lb

## 2021-12-02 DIAGNOSIS — I10 Essential (primary) hypertension: Secondary | ICD-10-CM

## 2021-12-02 DIAGNOSIS — I872 Venous insufficiency (chronic) (peripheral): Secondary | ICD-10-CM

## 2021-12-02 DIAGNOSIS — R7302 Impaired glucose tolerance (oral): Secondary | ICD-10-CM

## 2021-12-02 DIAGNOSIS — R051 Acute cough: Secondary | ICD-10-CM

## 2021-12-02 MED ORDER — HYDROCODONE BIT-HOMATROP MBR 5-1.5 MG/5ML PO SOLN: 5.0000 mL | Freq: Four times a day (QID) | ORAL | 0 refills | Status: AC | PRN

## 2021-12-02 MED ORDER — AZITHROMYCIN 250 MG PO TABS: ORAL_TABLET | ORAL | 1 refills | Status: AC

## 2021-12-02 NOTE — Telephone Encounter (Signed)
Please refill as per office routine med refill policy (all routine meds to be refilled for 3 mo or monthly (per pt preference) up to one year from last visit, then month to month grace period for 3 mo, then further med refills will have to be denied) ? ?

## 2021-12-02 NOTE — Progress Notes (Signed)
Patient ID: Warren Weeks, male   DOB: 1946/04/15, 76 y.o.   MRN: [id number]        Chief Complaint: follow up cough, leg swelling, hyperglycemia, htn       HPI:  Warren Weeks is a 76 y.o. male Here with acute onset mild to mod 2-3 days ST, HA, general weakness and malaise, with prod cough greenish sputum, but Pt denies chest pain, increased sob or doe, wheezing, orthopnea, PND, increased LE swelling, palpitations, dizziness or syncope, but does have mild persistent LE swelling left > right for > 6 mo, better in the AM, worse on the pm.   Pt denies polydipsia, polyuria, or new focal neuro s/s.    Pt denies recent wt loss, night sweats, loss of appetite, or other constitutional symptoms         Wt Readings from Last 3 Encounters:  12/02/21 250 lb (113.4 kg)  10/17/21 251 lb (113.9 kg)  10/14/21 250 lb (113.4 kg)   BP Readings from Last 3 Encounters:  12/02/21 126/60  10/17/21 128/80  10/14/21 (!) 148/77         Past Medical History:  Diagnosis Date   ALLERGIC RHINITIS 09/16/2007   ALLERGY 03/24/2007   Allergy    ANXIETY 03/28/2007   Arthritis    knees, back,shoulder   CAROTID ARTERY STENOSIS, BILATERAL 07/01/2009   yearly checks - no current problems   CEPHALGIA 06/08/2009   CHEST PAIN 09/16/2007   no current problems   COLONIC POLYPS, HX OF 09/16/2007   COPD (chronic obstructive pulmonary disease) ([place]) 05/12/2017   mild per patient - no inhaler   Dizziness and giddiness 06/08/2009   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   DYSPNEA/SHORTNESS OF BREATH 09/16/2007   ERECTILE DYSFUNCTION 03/28/2007   ESOPHAGEAL STRICTURE 09/16/2007   ESOPHAGITIS 03/24/2007   GERD 03/24/2007   GLUCOSE INTOLERANCE 09/16/2007   HEMORRHOIDS, INTERNAL 03/24/2007   HYPERCHOLESTEROLEMIA 03/24/2007   HYPERLIPIDEMIA 09/16/2007   HYPERTENSION 03/28/2007   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/2011   Impaired glucose tolerance 10/04/2010   LOW BACK PAIN 03/28/2007   MAGNETIC RESONANCE IMAGING, BRAIN, ABNORMAL 06/26/2009   NECK PAIN, LEFT  06/20/2010   OBESITY 03/24/2007   OTITIS MEDIA, LEFT 06/10/2009   Palpitations 06/20/2010   no current problems   PVD 06/25/2009   RASH-NONVESICULAR 06/17/2009   URI 08/16/2009   Past Surgical History:  Procedure Laterality Date   COLONOSCOPY  03/2013   polyps/[place]   LEFT HEART CATH AND CORONARY ANGIOGRAPHY N/A 10/14/2021   Procedure: LEFT HEART CATH AND CORONARY ANGIOGRAPHY;  Surgeon: Jettie Booze, MD;  Location: [facility];  Service: Cardiovascular;  Laterality: N/A;   NASAL SEPTOPLASTY W/ TURBINOPLASTY Bilateral 01/03/2021   Procedure: NASAL SEPTOPLASTY WITH BILATERAL TURBINATE REDUCTION;  Surgeon: Leta Baptist, MD;  Location: [facility];  Service: ENT;  Laterality: Bilateral;   s/p right knee arthroscopy     UPPER GASTROINTESTINAL ENDOSCOPY     gerd    reports that he quit smoking about 30 years ago. His smoking use included cigarettes. He has a 7.50 pack-year smoking history. He has never used smokeless tobacco. He reports current alcohol use of about 7.0 standard drinks of alcohol per week. He reports that he does not use drugs. family history includes Coronary artery disease in his brother and sister; Heart disease in his father; Lung cancer in his sister; Lymphoma in his mother; Ovarian cancer in his sister. Allergies  Allergen Reactions   Cefuroxime Axetil Rash   Current Outpatient  Medications on File Prior to Visit  Medication Sig Dispense Refill   acetaminophen (TYLENOL) 650 MG CR tablet Take 650 mg by mouth every 8 (eight) hours as needed for pain.     aspirin 81 MG EC tablet Take 1 tablet (81 mg total) by mouth daily. Swallow whole. 30 tablet 12   azelastine (OPTIVAR) 0.05 % ophthalmic solution Place 1 drop into both eyes 2 (two) times daily. 6 mL 12   cholecalciferol (VITAMIN D3) 25 MCG (1000 UNIT) tablet Take 1,000 Units by mouth daily.     Cyanocobalamin (B-12 PO) Take 1 tablet by mouth daily.     diclofenac Sodium (VOLTAREN) 1 % GEL Apply 2 g  topically daily as needed (knee pain).     Diclofenac Sodium CR 100 MG 24 hr tablet Take 100 mg by mouth daily.     ezetimibe (ZETIA) 10 MG tablet Take 1 tablet by mouth once daily 90 tablet 3   fluticasone (FLONASE) 50 MCG/ACT nasal spray Use 2 spray(s) in each nostril once daily (Patient taking differently: Place 2 sprays into both nostrils daily as needed for allergies.) 16 g 0   hydrochlorothiazide (HYDRODIURIL) 25 MG tablet Take 1 tablet (25 mg total) by mouth daily. (Patient taking differently: Take 25 mg by mouth daily as needed.) 90 tablet 3   lisinopril (ZESTRIL) 20 MG tablet Take 1 tablet by mouth once daily (Patient taking differently: Take 20 mg by mouth daily.) 90 tablet 3   Melatonin 10 MG TABS Take 10 mg by mouth at bedtime.      methocarbamol (ROBAXIN) 500 MG tablet Take 1 tablet (500 mg total) by mouth 2 (two) times daily as needed. (Patient taking differently: Take 500 mg by mouth 2 (two) times daily as needed for muscle spasms.) 40 tablet 2   Multiple Vitamin (ONE-A-DAY 55 PLUS PO) Take 1 tablet by mouth daily.     Omega-3 Fatty Acids (FISH OIL) 1000 MG CAPS Take 1,000 mg by mouth daily.     omeprazole (PRILOSEC) 20 MG capsule Take 1 capsule by mouth twice daily 180 capsule 3   rosuvastatin (CRESTOR) 40 MG tablet Take 1 tablet by mouth once daily 90 tablet 3   sildenafil (VIAGRA) 100 MG tablet Take 1 tablet (100 mg total) by mouth as needed for erectile dysfunction. 10 tablet 11   solifenacin (VESICARE) 5 MG tablet Take 1 tablet (5 mg total) by mouth daily. (Patient taking differently: Take 5 mg by mouth daily as needed (bladder).) 90 tablet 3   terazosin (HYTRIN) 5 MG capsule Take 5 mg by mouth at bedtime.      Testosterone 20.25 MG/1.25GM (1.62%) GEL Apply 2 Packages topically daily.     thiamine 250 MG tablet Take 250 mg by mouth daily.     triamcinolone cream (KENALOG) 0.5 % Apply 1 application topically 3 (three) times daily. (Patient taking differently: Apply 1 application   topically 2 (two) times daily as needed (rash).) 30 g 1   Turmeric 500 MG TABS Take 500 mg by mouth daily.     No current facility-administered medications on file prior to visit.        ROS:  All others reviewed and negative.  Objective        PE:  BP 126/60 (BP Location: Right Arm, Patient Position: Sitting, Cuff Size: Large)   Pulse 74   Temp 98.1 F (36.7 C) (Oral)   Ht '5\' 9"'$  (1.753 m)   Wt 250 lb (113.4 kg)   SpO2  95%   BMI 36.92 kg/m                 Constitutional: Pt appears in NAD, mild ill               HENT: Head: NCAT.                Right Ear: External ear normal.                 Left Ear: External ear normal. Bilat tm's with mild erythema.  Max sinus areas non tender.  Pharynx with mild erythema, no exudate               Eyes: . Pupils are equal, round, and reactive to light. Conjunctivae and EOM are normal               Nose: without d/c or deformity               Neck: Neck supple. Gross normal ROM               Cardiovascular: Normal rate and regular rhythm.                 Pulmonary/Chest: Effort normal and breath sounds decreased without rales or wheezing.                Abd:  Soft, NT, ND, + BS, no organomegaly               Neurological: Pt is alert. At baseline orientation, motor grossly intact               Skin: Skin is warm. No rashes, no other new lesions, LE edema - trace to 1+ edema left > right               Psychiatric: Pt behavior is normal without agitation   Micro: none  Cardiac tracings I have personally interpreted today:  none  Pertinent Radiological findings (summarize): none   Lab Results  Component Value Date   WBC 6.1 09/17/2021   HGB 13.0 09/17/2021   HCT 39.5 09/17/2021   PLT 146.0 (L) 09/17/2021   GLUCOSE 92 09/17/2021   CHOL 146 09/17/2021   TRIG 116.0 09/17/2021   HDL 62.30 09/17/2021   LDLDIRECT 176.0 04/03/2011   LDLCALC 61 09/17/2021   ALT 62 (H) 09/17/2021   AST 43 (H) 09/17/2021   NA 141 09/17/2021   K 4.8  09/17/2021   CL 107 09/17/2021   CREATININE 1.05 09/17/2021   BUN 16 09/17/2021   CO2 27 09/17/2021   TSH 1.55 09/17/2021   PSA 0.10 09/17/2021   HGBA1C 6.9 (H) 09/17/2021   MICROALBUR <0.7 09/14/2019   Assessment/Plan:  Warren Weeks is a 76 y.o. Black or African American [2] male with  has a past medical history of ALLERGIC RHINITIS (09/16/2007), ALLERGY (03/24/2007), Allergy, ANXIETY (03/28/2007), Arthritis, CAROTID ARTERY STENOSIS, BILATERAL (07/01/2009), CEPHALGIA (06/08/2009), CHEST PAIN (09/16/2007), COLONIC POLYPS, HX OF (09/16/2007), COPD (chronic obstructive pulmonary disease) ([place]) (05/12/2017), Dizziness and giddiness (06/08/2009), Dyshidrotic eczema (06/03/2018), DYSPNEA/SHORTNESS OF BREATH (09/16/2007), ERECTILE DYSFUNCTION (03/28/2007), ESOPHAGEAL STRICTURE (09/16/2007), ESOPHAGITIS (03/24/2007), GERD (03/24/2007), GLUCOSE INTOLERANCE (09/16/2007), HEMORRHOIDS, INTERNAL (03/24/2007), HYPERCHOLESTEROLEMIA (03/24/2007), HYPERLIPIDEMIA (09/16/2007), HYPERTENSION (03/28/2007), Hypogonadism male (10/09/2011), Impaired glucose tolerance (10/04/2010), LOW BACK PAIN (03/28/2007), MAGNETIC RESONANCE IMAGING, BRAIN, ABNORMAL (06/26/2009), NECK PAIN, LEFT (06/20/2010), OBESITY (03/24/2007), OTITIS MEDIA, LEFT (06/10/2009), Palpitations (06/20/2010), PVD (06/25/2009), RASH-NONVESICULAR (06/17/2009), and URI (08/16/2009).  Cough C/w acute bronchitis, cant r/o pna ,  declines cxr, but for zpack, and cough med prn,  to f/u any worsening symptoms or concerns  Venous insufficiency Chronic persistent little changed overall it seems, pt also encouraged for compression stocking start during daytime  Impaired glucose tolerance Lab Results  Component Value Date   HGBA1C 6.9 (H) 09/17/2021   Stable, pt to continue current medical treatment  - diet, wt control, exercise, declines add tx for now   Essential hypertension BP Readings from Last 3 Encounters:  12/02/21 126/60  10/17/21 128/80  10/14/21 (!) 148/77    Stable, pt to continue medical treatment hct 25 mg qd, lisinopril 20 mg qd  Followup: Return if symptoms worsen or fail to improve.  Cathlean Cower, MD 12/06/2021 12:44 PM [facility]

## 2021-12-02 NOTE — Patient Instructions (Signed)
Please take all new medication as prescribed - the antibiotic, and cough medicine as needed  Please continue all other medications as before, and refills have been done if requested.  Please have the pharmacy call with any other refills you may need.  Please keep your appointments with your specialists as you may have planned  Please consider wearing compression stockings to the legs during daytime only to reduce the swelling

## 2021-12-06 ENCOUNTER — Encounter: Payer: Self-pay | Admitting: Internal Medicine

## 2021-12-06 NOTE — Assessment & Plan Note (Signed)
C/w acute bronchitis, cant r/o pna , declines cxr, but for zpack, and cough med prn,  to f/u any worsening symptoms or concerns

## 2021-12-06 NOTE — Assessment & Plan Note (Signed)
Lab Results  Component Value Date   HGBA1C 6.9 (H) 09/17/2021   Stable, pt to continue current medical treatment  - diet, wt control, exercise, declines add tx for now

## 2021-12-06 NOTE — Assessment & Plan Note (Signed)
Chronic persistent little changed overall it seems, pt also encouraged for compression stocking start during daytime

## 2021-12-06 NOTE — Assessment & Plan Note (Signed)
BP Readings from Last 3 Encounters:  12/02/21 126/60  10/17/21 128/80  10/14/21 (!) 148/77   Stable, pt to continue medical treatment hct 25 mg qd, lisinopril 20 mg qd

## 2021-12-10 ENCOUNTER — Ambulatory Visit: Payer: Medicare PPO

## 2021-12-10 DIAGNOSIS — G4733 Obstructive sleep apnea (adult) (pediatric): Secondary | ICD-10-CM | POA: Diagnosis not present

## 2021-12-10 DIAGNOSIS — R0683 Snoring: Secondary | ICD-10-CM

## 2021-12-16 ENCOUNTER — Ambulatory Visit (INDEPENDENT_AMBULATORY_CARE_PROVIDER_SITE_OTHER): Payer: Medicare PPO | Admitting: Internal Medicine

## 2021-12-16 ENCOUNTER — Encounter: Payer: Self-pay | Admitting: Primary Care

## 2021-12-16 ENCOUNTER — Ambulatory Visit: Payer: Medicare PPO | Admitting: Primary Care

## 2021-12-16 VITALS — BP 132/78 | HR 71 | Ht 69.0 in | Wt 249.6 lb

## 2021-12-16 DIAGNOSIS — J449 Chronic obstructive pulmonary disease, unspecified: Secondary | ICD-10-CM

## 2021-12-16 DIAGNOSIS — G4730 Sleep apnea, unspecified: Secondary | ICD-10-CM

## 2021-12-16 DIAGNOSIS — J309 Allergic rhinitis, unspecified: Secondary | ICD-10-CM | POA: Diagnosis not present

## 2021-12-16 LAB — PULMONARY FUNCTION TEST
DL/VA % pred: 102 %
DL/VA: 4.13 ml/min/mmHg/L
DLCO cor % pred: 83 %
DLCO cor: 20.64 ml/min/mmHg
DLCO unc % pred: 79 %
DLCO unc: 19.65 ml/min/mmHg
FEF 25-75 Post: 4.21 L/sec
FEF 25-75 Pre: 3.69 L/sec
FEF2575-%Change-Post: 13 %
FEF2575-%Pred-Post: 188 %
FEF2575-%Pred-Pre: 165 %
FEV1-%Change-Post: 2 %
FEV1-%Pred-Post: 99 %
FEV1-%Pred-Pre: 97 %
FEV1-Post: 3 L
FEV1-Pre: 2.94 L
FEV1FVC-%Change-Post: 0 %
FEV1FVC-%Pred-Pre: 117 %
FEV6-%Change-Post: 1 %
FEV6-%Pred-Post: 89 %
FEV6-%Pred-Pre: 87 %
FEV6-Post: 3.47 L
FEV6-Pre: 3.41 L
FEV6FVC-%Pred-Post: 106 %
FEV6FVC-%Pred-Pre: 106 %
FVC-%Change-Post: 1 %
FVC-%Pred-Post: 83 %
FVC-%Pred-Pre: 82 %
FVC-Post: 3.47 L
FVC-Pre: 3.41 L
Post FEV1/FVC ratio: 87 %
Post FEV6/FVC ratio: 100 %
Pre FEV1/FVC ratio: 86 %
Pre FEV6/FVC Ratio: 100 %
RV % pred: 94 %
RV: 2.31 L
TLC % pred: 84 %
TLC: 5.79 L

## 2021-12-16 LAB — POCT EXHALED NITRIC OXIDE: FeNO level (ppb): 41

## 2021-12-16 MED ORDER — FLUTICASONE-SALMETEROL 250-50 MCG/ACT IN AEPB: 1.0000 | INHALATION_SPRAY | Freq: Two times a day (BID) | RESPIRATORY_TRACT | 1 refills | Status: DC

## 2021-12-16 NOTE — Progress Notes (Signed)
$'@Patient'P$  ID: Warren Weeks, male    DOB: 08/22/1945, 76 y.o.   MRN: [id number]  Chief Complaint  Patient presents with   Follow-up    Referring provider: Biagio Borg, MD  HPI: 76 year old male, former smoker quit 1993.  Past medical history significant for COPD, chronic sinusitis, coronary artery calcification, hypertension, GERD, diastolic dysfunction, obesity.  Previous [facility] pulmonary encounter:  10/17/2021 Patient presents today for sleep consult. Patient has symptoms of insomnia, gasping for air at night, snoring and daytime sleepiness. He reports having trouble falling and staying asleep. Most nights it takes him 20-30 mins to fall asleep, on occasional he might have a night where he is unable to fall asleep. He feels tired all the time. He takes '10mg'$  melatonin. He had sinus surgery back in July 2022. He gets short winded walking up steps with associated chest tightness. He tells me that he thinks he has copd. Denies cough.   Sleep questionnaire Symptoms-   Insomnia, restless sleep, gasping for air at night, snoring, daytime sleepiness  Prior sleep study- None  Bedtime- 11-12am Time to fall asleep- 20-30 mins (occasionally cant not sleep)  Nocturnal awakenings- three times  Out of bed/start of day- 9-10am  Weight changes- 4 lbs  Do you operate heavy machinery- No  Do you currently wear CPAP- No Do you current wear oxygen- No [facility]- 7  12/16/2021- Interim hx  Patient presents today for a 61-monthfollow-up with PFTs.  During his sleep consult in April 2023 patient reported having dyspnea symptoms with inclines along with an occasional cough and voice hoarseness. No current chest tightness. He has been told in the past that he has COPD. Cardiac CT in April 2023 showed mild centrilobular emphysema. Currently on azithromycin for sinus infection.  He had sinus surgery back in July 2022. He did home sleep last week, results are not available.   Pulmonary function testing 12/16/2021  FVC 3.47 (83%), FEV1 3.00 (99%), ratio 87, TLC 84%, DLCOunc 19.65 (79%) Normal lung function. No BD response.   Allergies  Allergen Reactions   Cefuroxime Axetil Rash    Immunization History  Administered Date(s) Administered   Fluad Quad(high Dose 65+) 02/21/2019, 03/12/2021   H1N1 04/22/2008   Influenza Whole 06/10/2009   Influenza, High Dose Seasonal PF 03/20/2016, 03/16/2017, 03/16/2018   Influenza,inj,Quad PF,6+ Mos 04/10/2015   Influenza-Unspecified 03/22/2012, 09/03/2014, 04/22/2020   PFIZER(Purple Top)SARS-COV-2 Vaccination 07/24/2019, 08/21/2019, 06/24/2020   Pneumococcal Conjugate-13 12/12/2013   Pneumococcal Polysaccharide-23 12/14/2014   Td 10/03/2008   Tdap 12/07/2018   Zoster Recombinat (Shingrix) 09/24/2017, 12/20/2017   Zoster, Live 10/12/2012    Past Medical History:  Diagnosis Date   ALLERGIC RHINITIS 09/16/2007   ALLERGY 03/24/2007   Allergy    ANXIETY 03/28/2007   Arthritis    knees, back,shoulder   CAROTID ARTERY STENOSIS, BILATERAL 07/01/2009   yearly checks - no current problems   CEPHALGIA 06/08/2009   CHEST PAIN 09/16/2007   no current problems   COLONIC POLYPS, HX OF 09/16/2007   COPD (chronic obstructive pulmonary disease) ([place] 05/12/2017   mild per patient - no inhaler   Dizziness and giddiness 06/08/2009   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   DYSPNEA/SHORTNESS OF BREATH 09/16/2007   ERECTILE DYSFUNCTION 03/28/2007   ESOPHAGEAL STRICTURE 09/16/2007   ESOPHAGITIS 03/24/2007   GERD 03/24/2007   GLUCOSE INTOLERANCE 09/16/2007   HEMORRHOIDS, INTERNAL 03/24/2007   HYPERCHOLESTEROLEMIA 03/24/2007   HYPERLIPIDEMIA 09/16/2007   HYPERTENSION 03/28/2007   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/2011   Impaired glucose tolerance 10/04/2010  LOW BACK PAIN 03/28/2007   MAGNETIC RESONANCE IMAGING, BRAIN, ABNORMAL 06/26/2009   NECK PAIN, LEFT 06/20/2010   OBESITY 03/24/2007   OTITIS MEDIA, LEFT 06/10/2009   Palpitations 06/20/2010   no current problems   PVD 06/25/2009    RASH-NONVESICULAR 06/17/2009   Sleep apnea 12/25/2021   URI 08/16/2009    Tobacco History: Social History   Tobacco Use  Smoking Status Former   Packs/day: 0.50   Years: 15.00   Total pack years: 7.50   Types: Cigarettes   Quit date: 06/23/1991   Years since quitting: 30.5  Smokeless Tobacco Never  Tobacco Comments   Quit 30 years ago   Counseling given: Not Answered Tobacco comments: Quit 30 years ago   Outpatient Medications Prior to Visit  Medication Sig Dispense Refill   acetaminophen (TYLENOL) 650 MG CR tablet Take 650 mg by mouth every 8 (eight) hours as needed for pain.     aspirin 81 MG EC tablet Take 1 tablet (81 mg total) by mouth daily. Swallow whole. 30 tablet 12   azelastine (OPTIVAR) 0.05 % ophthalmic solution Place 1 drop into both eyes 2 (two) times daily. 6 mL 12   azithromycin (ZITHROMAX) 250 MG tablet Take by mouth.     cholecalciferol (VITAMIN D3) 25 MCG (1000 UNIT) tablet Take 1,000 Units by mouth daily.     Cyanocobalamin (B-12 PO) Take 1 tablet by mouth daily.     diclofenac Sodium (VOLTAREN) 1 % GEL Apply 2 g topically daily as needed (knee pain).     Diclofenac Sodium CR 100 MG 24 hr tablet Take 100 mg by mouth daily.     ezetimibe (ZETIA) 10 MG tablet Take 1 tablet by mouth once daily 90 tablet 3   lisinopril (ZESTRIL) 20 MG tablet Take 1 tablet by mouth once daily (Patient taking differently: Take 20 mg by mouth daily.) 90 tablet 3   Melatonin 10 MG TABS Take 10 mg by mouth at bedtime.      methocarbamol (ROBAXIN) 500 MG tablet Take 1 tablet (500 mg total) by mouth 2 (two) times daily as needed. (Patient taking differently: Take 500 mg by mouth 2 (two) times daily as needed for muscle spasms.) 40 tablet 2   metoprolol tartrate (LOPRESSOR) 50 MG tablet Take 1 tablet by mouth twice daily 180 tablet 0   Multiple Vitamin (ONE-A-DAY 55 PLUS PO) Take 1 tablet by mouth daily.     Omega-3 Fatty Acids (FISH OIL) 1000 MG CAPS Take 1,000 mg by mouth daily.      omeprazole (PRILOSEC) 20 MG capsule Take 1 capsule by mouth twice daily 180 capsule 3   rosuvastatin (CRESTOR) 40 MG tablet Take 1 tablet by mouth once daily 90 tablet 3   sildenafil (VIAGRA) 100 MG tablet Take 1 tablet (100 mg total) by mouth as needed for erectile dysfunction. 10 tablet 11   solifenacin (VESICARE) 5 MG tablet Take 1 tablet (5 mg total) by mouth daily. (Patient taking differently: Take 5 mg by mouth daily as needed (bladder).) 90 tablet 3   terazosin (HYTRIN) 5 MG capsule Take 5 mg by mouth at bedtime.      Testosterone 20.25 MG/1.25GM (1.62%) GEL Apply 2 Packages topically daily.     thiamine 250 MG tablet Take 250 mg by mouth daily.     triamcinolone cream (KENALOG) 0.5 % Apply 1 application topically 3 (three) times daily. (Patient taking differently: Apply 1 application  topically 2 (two) times daily as needed (rash).) 30 g 1  Turmeric 500 MG TABS Take 500 mg by mouth daily.     fluticasone (FLONASE) 50 MCG/ACT nasal spray Use 2 spray(s) in each nostril once daily (Patient taking differently: Place 2 sprays into both nostrils daily as needed for allergies.) 16 g 0   hydrochlorothiazide (HYDRODIURIL) 25 MG tablet Take 1 tablet (25 mg total) by mouth daily. (Patient taking differently: Take 25 mg by mouth daily as needed.) 90 tablet 3   No facility-administered medications prior to visit.    Review of Systems  Review of Systems  Constitutional: Negative.   HENT:  Positive for congestion and postnasal drip.   Respiratory:  Negative for cough, chest tightness and wheezing.     Physical Exam  BP 132/78 (BP Location: Left Arm, Cuff Size: Normal)   Pulse 71   Ht '5\' 9"'$  (1.753 m)   Wt 249 lb 9.6 oz (113.2 kg)   SpO2 97%   BMI 36.86 kg/m  Physical Exam Constitutional:      Appearance: Normal appearance.  HENT:     Head: Normocephalic and atraumatic.     Mouth/Throat:     Mouth: Mucous membranes are moist.     Pharynx: Oropharynx is clear.  Cardiovascular:      Rate and Rhythm: Normal rate and regular rhythm.  Pulmonary:     Effort: Pulmonary effort is normal.     Breath sounds: Normal breath sounds.  Musculoskeletal:        General: Normal range of motion.  Skin:    General: Skin is warm.  Neurological:     General: No focal deficit present.     Mental Status: He is alert. Mental status is at baseline.  Psychiatric:        Mood and Affect: Mood normal.        Behavior: Behavior normal.        Thought Content: Thought content normal.        Judgment: Judgment normal.      Lab Results:  CBC    Component Value Date/Time   WBC 6.1 09/17/2021 1421   RBC 4.35 09/17/2021 1421   HGB 13.0 09/17/2021 1421   HCT 39.5 09/17/2021 1421   PLT 146.0 (L) 09/17/2021 1421   MCV 90.8 09/17/2021 1421   MCH 29.4 02/14/2020 1720   MCHC 33.0 09/17/2021 1421   RDW 14.4 09/17/2021 1421   LYMPHSABS 1.3 09/17/2021 1421   MONOABS 0.5 09/17/2021 1421   EOSABS 0.1 09/17/2021 1421   BASOSABS 0.0 09/17/2021 1421    BMET    Component Value Date/Time   NA 141 09/17/2021 1421   K 4.8 09/17/2021 1421   CL 107 09/17/2021 1421   CO2 27 09/17/2021 1421   GLUCOSE 92 09/17/2021 1421   BUN 16 09/17/2021 1421   CREATININE 1.05 09/17/2021 1421   CALCIUM 8.9 09/17/2021 1421   GFRNONAA >60 12/27/2020 0845   GFRAA >60 02/14/2020 1720    BNP No results found for: "BNP"  ProBNP    Component Value Date/Time   PROBNP 453.0 (H) 09/17/2021 1421    Imaging: No results found.   Assessment & Plan:   COPD (chronic obstructive pulmonary disease) ([place]) - Patient has symptoms of dyspnea with inclines and occasional cough.  Pulmonary function testing 12/16/21 did not show any evidence of obstructive lung disease or bronchodilator response.  Lung function was normal.  He has mild emphysema on CT imaging.  FENO elevated at 41.  Recommend trial of Wixela 250-37mg.  Follow-up in 4 to  6 weeks..   Sleep apnea - Home sleep study in June 2023 showed evidence of  moderate obstructive sleep apnea and moderate oxygen desaturations.  Patient needs CPAP titration study.     Martyn Ehrich, NP 12/25/2021

## 2021-12-18 ENCOUNTER — Other Ambulatory Visit: Payer: Self-pay | Admitting: Internal Medicine

## 2021-12-18 NOTE — Telephone Encounter (Signed)
Please refill as per office routine med refill policy (all routine meds to be refilled for 3 mo or monthly (per pt preference) up to one year from last visit, then month to month grace period for 3 mo, then further med refills will have to be denied) ? ?

## 2021-12-22 ENCOUNTER — Telehealth: Payer: Self-pay | Admitting: Pulmonary Disease

## 2021-12-22 DIAGNOSIS — G4733 Obstructive sleep apnea (adult) (pediatric): Secondary | ICD-10-CM | POA: Diagnosis not present

## 2021-12-22 NOTE — Telephone Encounter (Signed)
Call patient  Sleep study result  Date of study: 12/10/2021  Impression: Moderate obstructive sleep apnea Moderate oxygen desaturations  Recommendation: DME referral  Recommend CPAP therapy for moderate obstructive sleep apnea  Auto titrating CPAP with pressure settings of 5-15 will be appropriate  Encourage weight loss measures  Follow-up in the office 4 to 6 weeks following initiation of treatment

## 2021-12-22 NOTE — Telephone Encounter (Signed)
I called the patient and left a message for him to call back.

## 2021-12-24 NOTE — Telephone Encounter (Signed)
I called the patient and gave him the results and he is going to keep the follow up on 01/13/22 to go over results at that time. Nothing further needed.

## 2021-12-25 ENCOUNTER — Encounter: Payer: Self-pay | Admitting: Primary Care

## 2021-12-25 ENCOUNTER — Telehealth: Payer: Self-pay | Admitting: Primary Care

## 2021-12-25 DIAGNOSIS — G4730 Sleep apnea, unspecified: Secondary | ICD-10-CM | POA: Insufficient documentation

## 2021-12-25 DIAGNOSIS — G4733 Obstructive sleep apnea (adult) (pediatric): Secondary | ICD-10-CM

## 2021-12-25 HISTORY — DX: Sleep apnea, unspecified: G47.30

## 2021-12-25 NOTE — Assessment & Plan Note (Signed)
-   Home sleep study in June 2023 showed evidence of moderate obstructive sleep apnea and moderate oxygen desaturations.  Patient needs CPAP titration study.

## 2021-12-25 NOTE — Telephone Encounter (Signed)
Please let patient know that sleep study on 12/10/2021 with evidence of moderate obstructive sleep apnea.  AHI 27.3/h with SPO2 low 68% (average 91%).  Patient needs CPAP titration study to assess for correct CPAP pressure settings and possible need for oxygen.  Notify patient of results and recommendations.  If patient for any reason declines CPAP titration study or cant go into lab we can place an order for auto CPAP pressure settings 5 to 15 cm H2O.

## 2021-12-25 NOTE — Telephone Encounter (Signed)
Attempted to call pt but unable to reach. Left message for him to return call. °

## 2021-12-25 NOTE — Assessment & Plan Note (Signed)
-   Patient has symptoms of dyspnea with inclines and occasional cough.  Pulmonary function testing 12/16/21 did not show any evidence of obstructive lung disease or bronchodilator response.  Lung function was normal.  He has mild emphysema on CT imaging.  FENO elevated at 41.  Recommend trial of Wixela 250-62mg.  Follow-up in 4 to 6 weeks..Marland Kitchen

## 2021-12-26 NOTE — Telephone Encounter (Signed)
Keep apt for now, if has not had titration study before than can move out

## 2021-12-26 NOTE — Telephone Encounter (Signed)
Called and spoke with pt letting him know the results of HST and recs per BW and he verbalized understanding.  Order for cpap titration study has been placed. Pt is scheduled for a f/u 7/25. Beth, please advise if you want pt to have appt after titration study is complete or if you want pt to keep appt as scheduled.

## 2022-01-12 ENCOUNTER — Encounter: Payer: Self-pay | Admitting: Internal Medicine

## 2022-01-12 ENCOUNTER — Ambulatory Visit: Payer: Medicare PPO | Admitting: Internal Medicine

## 2022-01-12 ENCOUNTER — Telehealth: Payer: Self-pay

## 2022-01-12 VITALS — BP 126/82 | HR 53 | Ht 69.0 in | Wt 252.8 lb

## 2022-01-12 DIAGNOSIS — E7800 Pure hypercholesterolemia, unspecified: Secondary | ICD-10-CM

## 2022-01-12 NOTE — Progress Notes (Signed)
Cardiology Office Note:    Date:  01/12/2022   ID:  Warren Weeks, DOB May 09, 1946, MRN [id number]  PCP:  Biagio Borg, MD   [facility] Providers Cardiologist:  Janina Mayo, MD     Referring MD: Biagio Borg, MD   No chief complaint on file. Referred from PCP for hyperlipidemia  History of Present Illness:   Initial Visit Warren Weeks is a 76 y.o. male with a hx of BL carotid stenosis not significant, HTN, HLD LDL 78  Mr. [name] comes in to discuss cholesterol levels and risk as a referral from his PCP. He was told LDL was high and to consider lipid clinic and a CAC score was ordered.  He denies angina, dyspnea on exertion, lower extremity edema, PND or orthopnea.  He feels infrequent palpitations. No syncope Notably was seen in 2014 bu Dr. Lovena Le for palpitations. Cardiac monitor did not show any significant arrhythmias.  Cardiac Hx: none. No history of stroke  Family Hx: Father had CHF at 39. Sister had CABG. Sister passed 30  Social Hx: quit 30 years ago. 20 years smoking.  Interim Hx:  Underwent CAC with total score 1273, 85th percentile for age. Predominantly in the LAD. LDL 61 mg/dL 3 weeks ago.  No chest pressure. He can feel some ectopy. With walking up steps he can feel a burning sensation in his chest and SOB. He denies PND, orthopnea. Has some leg swelling. A TTE was obtained.  Interim Hx:  01/12/2022- Today is he s/p LHC. He has non obstructive disease. He was diagnosed with sleep apnea. He was diagnosed with mild asthma. PFTs were normal. He has trouble with activity 2/2 knee pain.  Past Medical History:  Diagnosis Date   ALLERGIC RHINITIS 09/16/2007   ALLERGY 03/24/2007   Allergy    ANXIETY 03/28/2007   Arthritis    knees, back,shoulder   CAROTID ARTERY STENOSIS, BILATERAL 07/01/2009   yearly checks - no current problems   CEPHALGIA 06/08/2009   CHEST PAIN 09/16/2007   no current problems   COLONIC POLYPS, HX OF 09/16/2007   COPD (chronic obstructive  pulmonary disease) ([place]) 05/12/2017   mild per patient - no inhaler   Dizziness and giddiness 06/08/2009   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   DYSPNEA/SHORTNESS OF BREATH 09/16/2007   ERECTILE DYSFUNCTION 03/28/2007   ESOPHAGEAL STRICTURE 09/16/2007   ESOPHAGITIS 03/24/2007   GERD 03/24/2007   GLUCOSE INTOLERANCE 09/16/2007   HEMORRHOIDS, INTERNAL 03/24/2007   HYPERCHOLESTEROLEMIA 03/24/2007   HYPERLIPIDEMIA 09/16/2007   HYPERTENSION 03/28/2007   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/2011   Impaired glucose tolerance 10/04/2010   LOW BACK PAIN 03/28/2007   MAGNETIC RESONANCE IMAGING, BRAIN, ABNORMAL 06/26/2009   NECK PAIN, LEFT 06/20/2010   OBESITY 03/24/2007   OTITIS MEDIA, LEFT 06/10/2009   Palpitations 06/20/2010   no current problems   PVD 06/25/2009   RASH-NONVESICULAR 06/17/2009   Sleep apnea 12/25/2021   URI 08/16/2009    Past Surgical History:  Procedure Laterality Date   COLONOSCOPY  03/2013   polyps/[place]   LEFT HEART CATH AND CORONARY ANGIOGRAPHY N/A 10/14/2021   Procedure: LEFT HEART CATH AND CORONARY ANGIOGRAPHY;  Surgeon: Jettie Booze, MD;  Location: [facility];  Service: Cardiovascular;  Laterality: N/A;   NASAL SEPTOPLASTY W/ TURBINOPLASTY Bilateral 01/03/2021   Procedure: NASAL SEPTOPLASTY WITH BILATERAL TURBINATE REDUCTION;  Surgeon: Leta Baptist, MD;  Location: [place];  Service: ENT;  Laterality: Bilateral;   s/p right knee arthroscopy  UPPER GASTROINTESTINAL ENDOSCOPY     gerd    Current Medications: Current Meds  Medication Sig   acetaminophen (TYLENOL) 650 MG CR tablet Take 650 mg by mouth every 8 (eight) hours as needed for pain.   aspirin 81 MG EC tablet Take 1 tablet (81 mg total) by mouth daily. Swallow whole.   Cyanocobalamin (B-12 PO) Take 1 tablet by mouth daily.   diclofenac Sodium (VOLTAREN) 1 % GEL Apply 2 g topically daily as needed (knee pain).   Diclofenac Sodium CR 100 MG 24 hr tablet Take 100 mg by mouth daily.   ezetimibe (ZETIA) 10 MG  tablet Take 1 tablet by mouth once daily   fluticasone (FLONASE) 50 MCG/ACT nasal spray Use 2 spray(s) in each nostril once daily   fluticasone-salmeterol (WIXELA INHUB) 250-50 MCG/ACT AEPB Inhale 1 puff into the lungs in the morning and at bedtime.   hydrochlorothiazide (HYDRODIURIL) 25 MG tablet Take 1 tablet by mouth once daily   lisinopril (ZESTRIL) 20 MG tablet Take 1 tablet by mouth once daily (Patient taking differently: Take 20 mg by mouth daily.)   Melatonin 10 MG TABS Take 10 mg by mouth at bedtime.    methocarbamol (ROBAXIN) 500 MG tablet Take 1 tablet (500 mg total) by mouth 2 (two) times daily as needed. (Patient taking differently: Take 500 mg by mouth 2 (two) times daily as needed for muscle spasms.)   metoprolol tartrate (LOPRESSOR) 50 MG tablet Take 1 tablet by mouth twice daily   Multiple Vitamin (ONE-A-DAY 55 PLUS PO) Take 1 tablet by mouth daily.   Omega-3 Fatty Acids (FISH OIL) 1000 MG CAPS Take 1,000 mg by mouth daily.   omeprazole (PRILOSEC) 20 MG capsule Take 1 capsule by mouth twice daily   rosuvastatin (CRESTOR) 40 MG tablet Take 1 tablet by mouth once daily   sildenafil (VIAGRA) 100 MG tablet Take 1 tablet (100 mg total) by mouth as needed for erectile dysfunction.   solifenacin (VESICARE) 5 MG tablet Take 1 tablet (5 mg total) by mouth daily. (Patient taking differently: Take 5 mg by mouth daily as needed (bladder).)   terazosin (HYTRIN) 5 MG capsule Take 5 mg by mouth at bedtime.    Testosterone 20.25 MG/1.25GM (1.62%) GEL Apply 2 Packages topically daily.   thiamine 250 MG tablet Take 250 mg by mouth daily.   Turmeric 500 MG TABS Take 500 mg by mouth daily.     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: [employer]  Tobacco Use   Smoking status: Former    Packs/day: 0.50    Years: 15.00    Total pack years: 7.50    Types: Cigarettes    Quit date: 06/23/1991    Years since quitting: 30.5   Smokeless tobacco: Never   Tobacco comments:    Quit 30 years ago  Vaping Use   Vaping Use: Never used  Substance and Sexual Activity   Alcohol use: Yes    Alcohol/week: 7.0 standard drinks of alcohol    Types: 7 Cans of beer per week    Comment: beer most days   Drug use: No   Sexual activity: Yes  Other Topics Concern  Not on file  Social History Narrative   Not on file   Social Determinants of Health   Financial Resource Strain: Low Risk  (08/13/2021)   Overall Financial Resource Strain (CARDIA)    Difficulty of Paying Living Expenses: Not hard at all  Food Insecurity: No Food Insecurity (08/13/2021)   Hunger Vital Sign    Worried About Running Out of Food in the Last Year: Never true    Ran Out of Food in the Last Year: Never true  Transportation Needs: No Transportation Needs (08/13/2021)   PRAPARE - Hydrologist (Medical): No    Lack of Transportation (Non-Medical): No  Physical Activity: Inactive (08/13/2021)   Exercise Vital Sign    Days of Exercise per Week: 0 days    Minutes of Exercise per Session: 0 min  Stress: No Stress Concern Present (08/13/2021)   [place]    Feeling of Stress : Not at all  Social Connections: [place] (08/13/2021)   Social Connection and Isolation Panel [NHANES]    Frequency of Communication with Friends and Family: More than three times a week    Frequency of Social Gatherings with Friends and Family: More than three times a week    Attends Religious Services: More than 4 times per year    Active Member of [employer] or Organizations: No    Attends Music therapist: More than 4 times per year    Marital Status: Married     Family  History: The patient's family history includes Coronary artery disease in his brother and sister; Heart disease in his father; Lung cancer in his sister; Lymphoma in his mother; Ovarian cancer in his sister. There is no history of Colon cancer, Esophageal cancer, Rectal cancer, Stomach cancer, or Colon polyps.  ROS:   Please see the history of present illness.     All other systems reviewed and are negative.  EKGs/Labs/Other Studies Reviewed:    The following studies were reviewed today:   EKG:  EKG is  ordered today.  The ekg ordered today demonstrates   04/10/2021-NSR Normal EKG  10/09/2021- sinus bradycardia 56 bpm  Recent Labs: 09/17/2021: ALT 62; BUN 16; Creatinine, Ser 1.05; Hemoglobin 13.0; Platelets 146.0; Potassium 4.8; Pro B Natriuretic peptide (BNP) 453.0; Sodium 141; TSH 1.55   Recent Lipid Panel    Component Value Date/Time   CHOL 146 09/17/2021 1421   TRIG 116.0 09/17/2021 1421   HDL 62.30 09/17/2021 1421   CHOLHDL 2 09/17/2021 1421   VLDL 23.2 09/17/2021 1421   LDLCALC 61 09/17/2021 1421   LDLDIRECT 176.0 04/03/2011 0803     Risk Assessment/Calculations:     The 10-year ASCVD risk score (Arnett DK, et al., 2019) is: 17.5%   Values used to calculate the score:     Age: 48 years     Sex: Male     Is Non-Hispanic African American: Yes     Diabetic: No     Tobacco smoker: No     Systolic Blood Pressure: 342 mmHg     Is BP treated: Yes     HDL Cholesterol: 62.3 mg/dL     Total Cholesterol: 146 mg/dL       Physical Exam:    VS:    Vitals:   01/12/22 0915  BP: 126/82  Pulse: (!) 53  SpO2: 95%     Wt Readings from Last 3 Encounters:  01/12/22 252 lb 12.8  oz (114.7 kg)  12/16/21 249 lb 9.6 oz (113.2 kg)  12/02/21 250 lb (113.4 kg)     GEN:  Well nourished, well developed in no acute distress HEENT: Normal NECK: No JVD; CARDIAC: RRR, no murmurs, rubs, gallops RESPIRATORY:  Clear to auscultation without rales, wheezing or rhonchi  ABDOMEN:  Soft, non-tender, non-distended MUSCULOSKELETAL:  No edema; No deformity  SKIN: Warm and dry NEUROLOGIC:  Alert and oriented x 3 PSYCHIATRIC:  Normal affect   ASSESSMENT:    CAC: significantly elevated. LDL goal < 70 mg/dL. He is 61 mg/dL. Continue rosuvastatin 40 mg daily and zetia. He noted chest discomfort with activity; LHC showed non obstructive . Normal renal fxn. Continue aspirin. We discussed the importance of cutting his   HTN: continue HCTz 25 mg daily. Continue metop tartrate 50 mg BID. Continue lisinopril 20 mg daily.   PLAN:    In order of problems listed above:   Follow up one year    Medication Adjustments/Labs and Tests Ordered: Current medicines are reviewed at length with the patient today.  Concerns regarding medicines are outlined above.    Signed, Janina Mayo, MD  01/12/2022 9:18 AM    [place]

## 2022-01-12 NOTE — Telephone Encounter (Signed)
Spoke with pt who states he has not completed the C Pap titration study but it is scheduled for August 16th. Pt informed that OV is not needed until after titration is completed. Pt stated understanding. Nothing further needed at this time.

## 2022-01-12 NOTE — Patient Instructions (Signed)
Medication Instructions:  No Changes In Medications at this time.  *If you need a refill on your cardiac medications before your next appointment, please call your pharmacy*  Lab Work: None Ordered At This Time.  If you have labs (blood work) drawn today and your tests are completely normal, you will receive your results only by: [place] (if you have MyChart) OR A paper copy in the mail If you have any lab test that is abnormal or we need to change your treatment, we will call you to review the results.  Testing/Procedures: None Ordered At This Time.   Follow-Up: At [facility], you and your health needs are our priority.  As part of our continuing mission to provide you with exceptional heart care, we have created designated Provider Care Teams.  These Care Teams include your primary Cardiologist (physician) and Advanced Practice Providers (APPs -  Physician Assistants and Nurse Practitioners) who all work together to provide you with the care you need, when you need it.  Your next appointment:   1 year(s)  The format for your next appointment:   In Person  Provider:   Janina Mayo, MD

## 2022-01-12 NOTE — Progress Notes (Deleted)
$'@Patient'R$  ID: Warren Weeks, male    DOB: 1946-06-06, 76 y.o.   MRN: [id number]  No chief complaint on file.   Referring provider: Biagio Borg, MD  HPI: 76 year old male, former smoker quit 1993.  Past medical history significant for COPD, chronic sinusitis, coronary artery calcification, hypertension, GERD, diastolic dysfunction, obesity.  Previous [facility] pulmonary encounter:  10/17/2021 Patient presents today for sleep consult. Patient has symptoms of insomnia, gasping for air at night, snoring and daytime sleepiness. He reports having trouble falling and staying asleep. Most nights it takes him 20-30 mins to fall asleep, on occasional he might have a night where he is unable to fall asleep. He feels tired all the time. He takes '10mg'$  melatonin. He had sinus surgery back in July 2022. He gets short winded walking up steps with associated chest tightness. He tells me that he thinks he has copd. Denies cough.   Sleep questionnaire Symptoms-   Insomnia, restless sleep, gasping for air at night, snoring, daytime sleepiness  Prior sleep study- None  Bedtime- 11-12am Time to fall asleep- 20-30 mins (occasionally cant not sleep)  Nocturnal awakenings- three times  Out of bed/start of day- 9-10am  Weight changes- 4 lbs  Do you operate heavy machinery- No  Do you currently wear CPAP- No Do you current wear oxygen- No [place]- 7  12/16/2021 Patient presents today for a 53-monthfollow-up with PFTs.  During his sleep consult in April 2023 patient reported having dyspnea symptoms with inclines along with an occasional cough and voice hoarseness. No current chest tightness. He has been told in the past that he has COPD. Cardiac CT in April 2023 showed mild centrilobular emphysema. Currently on azithromycin for sinus infection.  He had sinus surgery back in July 2022. He did home sleep last week, results are not available.    01/13/2022 Patient presents today for     Pulmonary function  testing 12/16/2021 FVC 3.47 (83%), FEV1 3.00 (99%), ratio 87, TLC 84%, DLCOunc 19.65 (79%) Normal lung function. No BD response.        Allergies  Allergen Reactions   Cefuroxime Axetil Rash    Immunization History  Administered Date(s) Administered   Fluad Quad(high Dose 65+) 02/21/2019, 03/12/2021   H1N1 04/22/2008   Influenza Whole 06/10/2009   Influenza, High Dose Seasonal PF 03/20/2016, 03/16/2017, 03/16/2018   Influenza,inj,Quad PF,6+ Mos 04/10/2015   Influenza-Unspecified 03/22/2012, 09/03/2014, 04/22/2020   PFIZER(Purple Top)SARS-COV-2 Vaccination 07/24/2019, 08/21/2019, 06/24/2020   Pneumococcal Conjugate-13 12/12/2013   Pneumococcal Polysaccharide-23 12/14/2014   Td 10/03/2008   Tdap 12/07/2018   Zoster Recombinat (Shingrix) 09/24/2017, 12/20/2017   Zoster, Live 10/12/2012    Past Medical History:  Diagnosis Date   ALLERGIC RHINITIS 09/16/2007   ALLERGY 03/24/2007   Allergy    ANXIETY 03/28/2007   Arthritis    knees, back,shoulder   CAROTID ARTERY STENOSIS, BILATERAL 07/01/2009   yearly checks - no current problems   CEPHALGIA 06/08/2009   CHEST PAIN 09/16/2007   no current problems   COLONIC POLYPS, HX OF 09/16/2007   COPD (chronic obstructive pulmonary disease) ([place] 05/12/2017   mild per patient - no inhaler   Dizziness and giddiness 06/08/2009   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   DYSPNEA/SHORTNESS OF BREATH 09/16/2007   ERECTILE DYSFUNCTION 03/28/2007   ESOPHAGEAL STRICTURE 09/16/2007   ESOPHAGITIS 03/24/2007   GERD 03/24/2007   GLUCOSE INTOLERANCE 09/16/2007   HEMORRHOIDS, INTERNAL 03/24/2007   HYPERCHOLESTEROLEMIA 03/24/2007   HYPERLIPIDEMIA 09/16/2007   HYPERTENSION 03/28/2007   Hypogonadism male  10/09/2011   Impaired glucose tolerance 10/04/2010   LOW BACK PAIN 03/28/2007   MAGNETIC RESONANCE IMAGING, BRAIN, ABNORMAL 06/26/2009   NECK PAIN, LEFT 06/20/2010   OBESITY 03/24/2007   OTITIS MEDIA, LEFT 06/10/2009   Palpitations 06/20/2010   no current problems    PVD 06/25/2009   RASH-NONVESICULAR 06/17/2009   Sleep apnea 12/25/2021   URI 08/16/2009    Tobacco History: Social History   Tobacco Use  Smoking Status Former   Packs/day: 0.50   Years: 15.00   Total pack years: 7.50   Types: Cigarettes   Quit date: 06/23/1991   Years since quitting: 30.5  Smokeless Tobacco Never  Tobacco Comments   Quit 30 years ago   Counseling given: Not Answered Tobacco comments: Quit 30 years ago   Outpatient Medications Prior to Visit  Medication Sig Dispense Refill   acetaminophen (TYLENOL) 650 MG CR tablet Take 650 mg by mouth every 8 (eight) hours as needed for pain.     aspirin 81 MG EC tablet Take 1 tablet (81 mg total) by mouth daily. Swallow whole. 30 tablet 12   Cyanocobalamin (B-12 PO) Take 1 tablet by mouth daily.     diclofenac Sodium (VOLTAREN) 1 % GEL Apply 2 g topically daily as needed (knee pain).     Diclofenac Sodium CR 100 MG 24 hr tablet Take 100 mg by mouth daily.     ezetimibe (ZETIA) 10 MG tablet Take 1 tablet by mouth once daily 90 tablet 3   fluticasone (FLONASE) 50 MCG/ACT nasal spray Use 2 spray(s) in each nostril once daily 16 g 0   fluticasone-salmeterol (WIXELA INHUB) 250-50 MCG/ACT AEPB Inhale 1 puff into the lungs in the morning and at bedtime. 1 each 1   hydrochlorothiazide (HYDRODIURIL) 25 MG tablet Take 1 tablet by mouth once daily 90 tablet 3   lisinopril (ZESTRIL) 20 MG tablet Take 1 tablet by mouth once daily (Patient taking differently: Take 20 mg by mouth daily.) 90 tablet 3   Melatonin 10 MG TABS Take 10 mg by mouth at bedtime.      methocarbamol (ROBAXIN) 500 MG tablet Take 1 tablet (500 mg total) by mouth 2 (two) times daily as needed. (Patient taking differently: Take 500 mg by mouth 2 (two) times daily as needed for muscle spasms.) 40 tablet 2   metoprolol tartrate (LOPRESSOR) 50 MG tablet Take 1 tablet by mouth twice daily 180 tablet 0   Multiple Vitamin (ONE-A-DAY 55 PLUS PO) Take 1 tablet by mouth daily.      Omega-3 Fatty Acids (FISH OIL) 1000 MG CAPS Take 1,000 mg by mouth daily.     omeprazole (PRILOSEC) 20 MG capsule Take 1 capsule by mouth twice daily 180 capsule 3   rosuvastatin (CRESTOR) 40 MG tablet Take 1 tablet by mouth once daily 90 tablet 3   sildenafil (VIAGRA) 100 MG tablet Take 1 tablet (100 mg total) by mouth as needed for erectile dysfunction. 10 tablet 11   solifenacin (VESICARE) 5 MG tablet Take 1 tablet (5 mg total) by mouth daily. (Patient taking differently: Take 5 mg by mouth daily as needed (bladder).) 90 tablet 3   terazosin (HYTRIN) 5 MG capsule Take 5 mg by mouth at bedtime.      Testosterone 20.25 MG/1.25GM (1.62%) GEL Apply 2 Packages topically daily.     thiamine 250 MG tablet Take 250 mg by mouth daily.     triamcinolone cream (KENALOG) 0.5 % Apply 1 application topically 3 (three) times daily. (Patient  not taking: Reported on 01/12/2022) 30 g 1   Turmeric 500 MG TABS Take 500 mg by mouth daily.     No facility-administered medications prior to visit.      Review of Systems  Review of Systems   Physical Exam  There were no vitals taken for this visit. Physical Exam   Lab Results:  CBC    Component Value Date/Time   WBC 6.1 09/17/2021 1421   RBC 4.35 09/17/2021 1421   HGB 13.0 09/17/2021 1421   HCT 39.5 09/17/2021 1421   PLT 146.0 (L) 09/17/2021 1421   MCV 90.8 09/17/2021 1421   MCH 29.4 02/14/2020 1720   MCHC 33.0 09/17/2021 1421   RDW 14.4 09/17/2021 1421   LYMPHSABS 1.3 09/17/2021 1421   MONOABS 0.5 09/17/2021 1421   EOSABS 0.1 09/17/2021 1421   BASOSABS 0.0 09/17/2021 1421    BMET    Component Value Date/Time   NA 141 09/17/2021 1421   K 4.8 09/17/2021 1421   CL 107 09/17/2021 1421   CO2 27 09/17/2021 1421   GLUCOSE 92 09/17/2021 1421   BUN 16 09/17/2021 1421   CREATININE 1.05 09/17/2021 1421   CALCIUM 8.9 09/17/2021 1421   GFRNONAA >60 12/27/2020 0845   GFRAA >60 02/14/2020 1720    BNP No results found for: "BNP"  ProBNP     Component Value Date/Time   PROBNP 453.0 (H) 09/17/2021 1421    Imaging: No results found.   Assessment & Plan:   No problem-specific Assessment & Plan notes found for this encounter.     Martyn Ehrich, NP 01/12/2022

## 2022-01-13 ENCOUNTER — Ambulatory Visit: Payer: Medicare PPO | Admitting: Primary Care

## 2022-01-15 ENCOUNTER — Other Ambulatory Visit: Payer: Self-pay | Admitting: Internal Medicine

## 2022-02-04 ENCOUNTER — Ambulatory Visit (HOSPITAL_BASED_OUTPATIENT_CLINIC_OR_DEPARTMENT_OTHER): Payer: Medicare PPO | Admitting: Pulmonary Disease

## 2022-02-04 ENCOUNTER — Telehealth: Payer: Self-pay

## 2022-02-04 NOTE — Telephone Encounter (Signed)
Very sorry, we should not give a new medication without trying this beforehand, as this can lead to problems with doing the sleep study

## 2022-02-04 NOTE — Telephone Encounter (Addendum)
Pt is requesting to get some for sleep for his sleep study tonight.  Pt has an appt on 8/18 for pain in legs and feet but he states that the worsens when he lays flat to go to bed.  Pt states he really needs to keep sleep study appt tonight at 8 but has to be able to be sleep for at least a total of 6 hrs.   Please advise.  Pt wants to know if its possible to have 1 day worth of medication or recommendations so he can let Sleep lab know before 2pm.+  Pt reports taking '10mg'$  of Melatonin currently and that didn't help at all.

## 2022-02-05 NOTE — Telephone Encounter (Signed)
I called patient back even though he had already had his sleep study, he expresses understanding the reason for not receiving meds and has confirmed his appt on 8/18.

## 2022-02-06 ENCOUNTER — Ambulatory Visit: Payer: Medicare PPO | Admitting: Internal Medicine

## 2022-02-06 VITALS — BP 136/80 | HR 58 | Temp 98.1°F | Ht 69.0 in | Wt 250.0 lb

## 2022-02-06 DIAGNOSIS — M79642 Pain in left hand: Secondary | ICD-10-CM

## 2022-02-06 DIAGNOSIS — R490 Dysphonia: Secondary | ICD-10-CM | POA: Diagnosis not present

## 2022-02-06 DIAGNOSIS — R252 Cramp and spasm: Secondary | ICD-10-CM | POA: Diagnosis not present

## 2022-02-06 DIAGNOSIS — I10 Essential (primary) hypertension: Secondary | ICD-10-CM | POA: Diagnosis not present

## 2022-02-06 DIAGNOSIS — G4700 Insomnia, unspecified: Secondary | ICD-10-CM

## 2022-02-06 DIAGNOSIS — M79605 Pain in left leg: Secondary | ICD-10-CM | POA: Diagnosis not present

## 2022-02-06 DIAGNOSIS — I6523 Occlusion and stenosis of bilateral carotid arteries: Secondary | ICD-10-CM

## 2022-02-06 DIAGNOSIS — M25562 Pain in left knee: Secondary | ICD-10-CM | POA: Diagnosis not present

## 2022-02-06 LAB — CBC WITH DIFFERENTIAL/PLATELET
Basophils Absolute: 0 10*3/uL (ref 0.0–0.1)
Basophils Relative: 0.4 % (ref 0.0–3.0)
Eosinophils Absolute: 0 10*3/uL (ref 0.0–0.7)
Eosinophils Relative: 0.7 % (ref 0.0–5.0)
HCT: 42.1 % (ref 39.0–52.0)
Hemoglobin: 14 g/dL (ref 13.0–17.0)
Lymphocytes Relative: 26.6 % (ref 12.0–46.0)
Lymphs Abs: 1.5 10*3/uL (ref 0.7–4.0)
MCHC: 33.1 g/dL (ref 30.0–36.0)
MCV: 91.2 fl (ref 78.0–100.0)
Monocytes Absolute: 0.4 10*3/uL (ref 0.1–1.0)
Monocytes Relative: 7.4 % (ref 3.0–12.0)
Neutro Abs: 3.7 10*3/uL (ref 1.4–7.7)
Neutrophils Relative %: 64.9 % (ref 43.0–77.0)
Platelets: 145 10*3/uL — ABNORMAL LOW (ref 150.0–400.0)
RBC: 4.62 Mil/uL (ref 4.22–5.81)
RDW: 14.6 % (ref 11.5–15.5)
WBC: 5.7 10*3/uL (ref 4.0–10.5)

## 2022-02-06 LAB — LIPID PANEL
Cholesterol: 160 mg/dL (ref 0–200)
HDL: 70.3 mg/dL (ref >39.00)
LDL Cholesterol: 77 mg/dL (ref 0–99)
NonHDL: 89.73
Total CHOL/HDL Ratio: 2
Triglycerides: 65 mg/dL (ref 0.0–149.0)
VLDL: 13 mg/dL (ref 0.0–40.0)

## 2022-02-06 LAB — HEMOGLOBIN A1C: Hgb A1c MFr Bld: 6.6 % — ABNORMAL HIGH (ref 4.6–6.5)

## 2022-02-06 LAB — BASIC METABOLIC PANEL
BUN: 25 mg/dL — ABNORMAL HIGH (ref 6–23)
CO2: 28 mEq/L (ref 19–32)
Calcium: 9.1 mg/dL (ref 8.4–10.5)
Chloride: 106 mEq/L (ref 96–112)
Creatinine, Ser: 1.22 mg/dL (ref 0.40–1.50)
GFR: 58.74 mL/min — ABNORMAL LOW (ref >60.00)
Glucose, Bld: 86 mg/dL (ref 70–99)
Potassium: 4.3 mEq/L (ref 3.5–5.1)
Sodium: 142 mEq/L (ref 135–145)

## 2022-02-06 LAB — HEPATIC FUNCTION PANEL
ALT: 36 U/L (ref 0–53)
AST: 31 U/L (ref 0–37)
Albumin: 4.5 g/dL (ref 3.5–5.2)
Alkaline Phosphatase: 71 U/L (ref 39–117)
Bilirubin, Direct: 0.1 mg/dL (ref 0.0–0.3)
Total Bilirubin: 0.5 mg/dL (ref 0.2–1.2)
Total Protein: 7.2 g/dL (ref 6.0–8.3)

## 2022-02-06 LAB — MAGNESIUM: Magnesium: 2 mg/dL (ref 1.5–2.5)

## 2022-02-06 MED ORDER — TRAZODONE HCL 100 MG PO TABS: 100.0000 mg | ORAL_TABLET | Freq: Every evening | ORAL | 1 refills | Status: DC | PRN

## 2022-02-06 MED ORDER — METHOCARBAMOL 500 MG PO TABS: 500.0000 mg | ORAL_TABLET | Freq: Two times a day (BID) | ORAL | 2 refills | Status: DC | PRN

## 2022-02-06 MED ORDER — DICLOFENAC SODIUM ER 100 MG PO TB24
100.0000 mg | ORAL_TABLET | Freq: Every day | ORAL | 1 refills | Status: DC
Start: 2022-02-06 — End: 2022-08-25

## 2022-02-06 NOTE — Progress Notes (Unsigned)
Patient ID: Warren Weeks, male   DOB: [birth year], 76 y.o.   MRN: [id number]        Chief Complaint: follow up numerous complaints       HPI:  Warren Weeks is a 76 y.o. male here with c/o left hand CTS like symptoms with numbnes and discomfort, sleeps with left elbow bent and hand under the head on left side.  Also with 2-3 wks very frequent left leg cramp at night that wakes him up, but also pain on ambulation during the days with walking as well, better with rest.  Also due for f/u carotid vascular study as has gotten away from his recommended f/u schedule.  Also has worsening insomnia with getting to sleep and staying asleep in the past month, Denies worsening depressive symptoms, suicidal ideation, or panic; has ongoing anxiety, not increased recently.   Pt denies chest pain, increased sob or doe, wheezing, orthopnea, PND, increased LE swelling, palpitations, dizziness or syncope.   Pt denies polydipsia, polyuria, or new focal neuro s/s.    Pt denies fever, wt loss, night sweats, loss of appetite, or other constitutional symptoms  Also with 1 mo worsening hoarseness of voice with speaking only a few sentences.  Also with mild onset worsening left knee arthritis on walking different from calf and other pain with mild intermittent swelling, asks for diclofenac ER 100 mg qd prn as this has worked per ortho in past.  No falls.    Wt Readings from Last 3 Encounters:  02/06/22 250 lb (113.4 kg)  01/12/22 252 lb 12.8 oz (114.7 kg)  12/16/21 249 lb 9.6 oz (113.2 kg)   BP Readings from Last 3 Encounters:  02/06/22 136/80  01/12/22 126/82  12/16/21 132/78         Past Medical History:  Diagnosis Date   ALLERGIC RHINITIS 09/16/2007   ALLERGY 03/24/2007   Allergy    ANXIETY 03/28/2007   Arthritis    knees, back,shoulder   CAROTID ARTERY STENOSIS, BILATERAL 07/01/2009   yearly checks - no current problems   CEPHALGIA 06/08/2009   CHEST PAIN 09/16/2007   no current problems   COLONIC POLYPS, HX OF  09/16/2007   COPD (chronic obstructive pulmonary disease) ([place]) 05/12/2017   mild per patient - no inhaler   Dizziness and giddiness 06/08/2009   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   DYSPNEA/SHORTNESS OF BREATH 09/16/2007   ERECTILE DYSFUNCTION 03/28/2007   ESOPHAGEAL STRICTURE 09/16/2007   ESOPHAGITIS 03/24/2007   GERD 03/24/2007   GLUCOSE INTOLERANCE 09/16/2007   HEMORRHOIDS, INTERNAL 03/24/2007   HYPERCHOLESTEROLEMIA 03/24/2007   HYPERLIPIDEMIA 09/16/2007   HYPERTENSION 03/28/2007   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/2011   Impaired glucose tolerance 10/04/2010   LOW BACK PAIN 03/28/2007   MAGNETIC RESONANCE IMAGING, BRAIN, ABNORMAL 06/26/2009   NECK PAIN, LEFT 06/20/2010   OBESITY 03/24/2007   OTITIS MEDIA, LEFT 06/10/2009   Palpitations 06/20/2010   no current problems   PVD 06/25/2009   RASH-NONVESICULAR 06/17/2009   Sleep apnea 12/25/2021   URI 08/16/2009   Past Surgical History:  Procedure Laterality Date   COLONOSCOPY  03/2013   polyps/[place]   LEFT HEART CATH AND CORONARY ANGIOGRAPHY N/A 10/14/2021   Procedure: LEFT HEART CATH AND CORONARY ANGIOGRAPHY;  Surgeon: Jettie Booze, MD;  Location: [facility];  Service: Cardiovascular;  Laterality: N/A;   NASAL SEPTOPLASTY W/ TURBINOPLASTY Bilateral 01/03/2021   Procedure: NASAL SEPTOPLASTY WITH BILATERAL TURBINATE REDUCTION;  Surgeon: Leta Baptist, MD;  Location: [facility];  Service:  ENT;  Laterality: Bilateral;   s/p right knee arthroscopy     UPPER GASTROINTESTINAL ENDOSCOPY     gerd    reports that he quit smoking about 30 years ago. His smoking use included cigarettes. He has a 7.50 pack-year smoking history. He has never used smokeless tobacco. He reports current alcohol use of about 7.0 standard drinks of alcohol per week. He reports that he does not use drugs. family history includes Coronary artery disease in his brother and sister; Heart disease in his father; Lung cancer in his sister; Lymphoma in his mother; Ovarian cancer  in his sister. Allergies  Allergen Reactions   Cefuroxime Axetil Rash   Current Outpatient Medications on File Prior to Visit  Medication Sig Dispense Refill   acetaminophen (TYLENOL) 650 MG CR tablet Take 650 mg by mouth every 8 (eight) hours as needed for pain.     aspirin 81 MG EC tablet Take 1 tablet (81 mg total) by mouth daily. Swallow whole. 30 tablet 12   Cyanocobalamin (B-12 PO) Take 1 tablet by mouth daily.     diclofenac Sodium (VOLTAREN) 1 % GEL Apply 2 g topically daily as needed (knee pain).     ezetimibe (ZETIA) 10 MG tablet Take 1 tablet by mouth once daily 90 tablet 3   fluticasone (FLONASE) 50 MCG/ACT nasal spray Use 2 spray(s) in each nostril once daily 16 g 0   fluticasone-salmeterol (WIXELA INHUB) 250-50 MCG/ACT AEPB Inhale 1 puff into the lungs in the morning and at bedtime. 1 each 1   hydrochlorothiazide (HYDRODIURIL) 25 MG tablet Take 1 tablet by mouth once daily 90 tablet 3   lisinopril (ZESTRIL) 20 MG tablet Take 1 tablet by mouth once daily (Patient taking differently: Take 20 mg by mouth daily.) 90 tablet 3   Melatonin 10 MG TABS Take 10 mg by mouth at bedtime.      metoprolol tartrate (LOPRESSOR) 50 MG tablet Take 1 tablet by mouth twice daily 180 tablet 0   Multiple Vitamin (ONE-A-DAY 55 PLUS PO) Take 1 tablet by mouth daily.     Omega-3 Fatty Acids (FISH OIL) 1000 MG CAPS Take 1,000 mg by mouth daily.     omeprazole (PRILOSEC) 20 MG capsule Take 1 capsule by mouth twice daily 180 capsule 3   rosuvastatin (CRESTOR) 40 MG tablet Take 1 tablet by mouth once daily 90 tablet 3   sildenafil (VIAGRA) 100 MG tablet Take 1 tablet (100 mg total) by mouth as needed for erectile dysfunction. 10 tablet 11   solifenacin (VESICARE) 5 MG tablet Take 1 tablet (5 mg total) by mouth daily. (Patient taking differently: Take 5 mg by mouth daily as needed (bladder).) 90 tablet 3   terazosin (HYTRIN) 5 MG capsule Take 5 mg by mouth at bedtime.      Testosterone 20.25 MG/1.25GM  (1.62%) GEL Apply 2 Packages topically daily.     thiamine 250 MG tablet Take 250 mg by mouth daily.     triamcinolone cream (KENALOG) 0.5 % Apply 1 application topically 3 (three) times daily. 30 g 1   Turmeric 500 MG TABS Take 500 mg by mouth daily.     No current facility-administered medications on file prior to visit.        ROS:  All others reviewed and negative.  Objective        PE:  BP 136/80 (BP Location: Right Arm, Patient Position: Sitting, Cuff Size: Large)   Pulse (!) 58   Temp 98.1 F (36.7  C) (Oral)   Ht '5\' 9"'$  (1.753 m)   Wt 250 lb (113.4 kg)   SpO2 92%   BMI 36.92 kg/m                 Constitutional: Pt appears in NAD               HENT: Head: NCAT.                Right Ear: External ear normal.                 Left Ear: External ear normal.                Eyes: . Pupils are equal, round, and reactive to light. Conjunctivae and EOM are normal               Nose: without d/c or deformity               Neck: Neck supple. Gross normal ROM               Cardiovascular: Normal rate and regular rhythm.                 Pulmonary/Chest: Effort normal and breath sounds without rales or wheezing.                Abd:  Soft, NT, ND, + BS, no organomegaly               Neurological: Pt is alert. At baseline orientation, motor grossly intact               Skin: Skin is warm. No rashes, no other new lesions, LE edema - none, left knee with marked degenerative change, no effusion currently               Psychiatric: Pt behavior is normal without agitation   Micro: none  Cardiac tracings I have personally interpreted today:  none  Pertinent Radiological findings (summarize): none   Lab Results  Component Value Date   WBC 5.7 02/06/2022   HGB 14.0 02/06/2022   HCT 42.1 02/06/2022   PLT 145.0 (L) 02/06/2022   GLUCOSE 86 02/06/2022   CHOL 160 02/06/2022   TRIG 65.0 02/06/2022   HDL 70.30 02/06/2022   LDLDIRECT 176.0 04/03/2011   LDLCALC 77 02/06/2022   ALT 36  02/06/2022   AST 31 02/06/2022   NA 142 02/06/2022   K 4.3 02/06/2022   CL 106 02/06/2022   CREATININE 1.22 02/06/2022   BUN 25 (H) 02/06/2022   CO2 28 02/06/2022   TSH 1.55 09/17/2021   PSA 0.10 09/17/2021   HGBA1C 6.6 (H) 02/06/2022   MICROALBUR <0.7 09/14/2019   Assessment/Plan:  Cornelius Marullo Woessner is a 76 y.o. Black or African American [2] male with  has a past medical history of ALLERGIC RHINITIS (09/16/2007), ALLERGY (03/24/2007), Allergy, ANXIETY (03/28/2007), Arthritis, CAROTID ARTERY STENOSIS, BILATERAL (07/01/2009), CEPHALGIA (06/08/2009), CHEST PAIN (09/16/2007), COLONIC POLYPS, HX OF (09/16/2007), COPD (chronic obstructive pulmonary disease) ([place]) (05/12/2017), Dizziness and giddiness (06/08/2009), Dyshidrotic eczema (06/03/2018), DYSPNEA/SHORTNESS OF BREATH (09/16/2007), ERECTILE DYSFUNCTION (03/28/2007), ESOPHAGEAL STRICTURE (09/16/2007), ESOPHAGITIS (03/24/2007), GERD (03/24/2007), GLUCOSE INTOLERANCE (09/16/2007), HEMORRHOIDS, INTERNAL (03/24/2007), HYPERCHOLESTEROLEMIA (03/24/2007), HYPERLIPIDEMIA (09/16/2007), HYPERTENSION (03/28/2007), Hypogonadism male (10/09/2011), Impaired glucose tolerance (10/04/2010), LOW BACK PAIN (03/28/2007), MAGNETIC RESONANCE IMAGING, BRAIN, ABNORMAL (06/26/2009), NECK PAIN, LEFT (06/20/2010), OBESITY (03/24/2007), OTITIS MEDIA, LEFT (06/10/2009), Palpitations (06/20/2010), PVD (06/25/2009), RASH-NONVESICULAR (06/17/2009), Sleep apnea (12/25/2021), and URI (08/16/2009).  Carotid artery stenosis Due  for f/u carotid vascular study - will order  Left leg pain With hx of known PVD - for bilat LE ABI study  Hoarseness Some degree noted today as well - for ENT referral per pt reqeust  Cramp in lower leg ? Etiology, also for robaxin 500 mg qhs prn  Insomnia With recent worsening, for trazodone 50 mg qhs prn  Left knee pain With worsening DJD - ok for diclofenac ER 100 mg qd prn  Essential hypertension BP Readings from Last 3 Encounters:  02/06/22 136/80  01/12/22 126/82   12/16/21 132/78   Stable, pt to continue medical treatment hct 25 mg qd, lisinopril 20 mg qd   Left hand pain C/w mild CTS like symptoms, pt encouraged to not sleep with elbow bent, and also try left wrist splint qhs prn until improved, consider hand surgury if not improved in 2-3 wks  Followup: Return in about 3 months (around 05/09/2022).  Cathlean Cower, MD 02/08/2022 10:22 AM [facility]

## 2022-02-06 NOTE — Patient Instructions (Addendum)
Please try to sleep with your left arm straighter at night  Please try the left wrist splint at night for possible carpal tunnel symptoms  Please continue all other medications as before, and refills have been done if requested - the diclofenac Xr 100 mg per day  Please take the robaxin (methocarbamol) at bedtime to avoid further night time cramping  Please take all new medication as prescribed  - the trazodone for sleeping as needed  Please have the pharmacy call with any other refills you may need.  Please continue your efforts at being more active, low cholesterol diet, and weight control  Please keep your appointments with your specialists as you may have planned  You will be contacted regarding the referral for: leg circulation testing, and carotid ultrasound  You will be contacted regarding the referral for:ENT  Please go to the LAB at the blood drawing area for the tests to be done  You will be contacted by phone if any changes need to be made immediately.  Otherwise, you will receive a letter about your results with an explanation, but please check with MyChart first.  Please remember to sign up for MyChart if you have not done so, as this will be important to you in the future with finding out test results, communicating by private email, and scheduling acute appointments online when needed.  Please make an Appointment to return in 3 months, or sooner if needed

## 2022-02-08 ENCOUNTER — Encounter: Payer: Self-pay | Admitting: Internal Medicine

## 2022-02-08 DIAGNOSIS — R490 Dysphonia: Secondary | ICD-10-CM | POA: Insufficient documentation

## 2022-02-08 DIAGNOSIS — M79642 Pain in left hand: Secondary | ICD-10-CM | POA: Insufficient documentation

## 2022-02-08 DIAGNOSIS — M79605 Pain in left leg: Secondary | ICD-10-CM | POA: Insufficient documentation

## 2022-02-08 DIAGNOSIS — R252 Cramp and spasm: Secondary | ICD-10-CM | POA: Insufficient documentation

## 2022-02-08 NOTE — Assessment & Plan Note (Signed)
?   Etiology, also for robaxin 500 mg qhs prn

## 2022-02-08 NOTE — Assessment & Plan Note (Signed)
Some degree noted today as well - for ENT referral per pt reqeust

## 2022-02-08 NOTE — Assessment & Plan Note (Signed)
With worsening DJD - ok for diclofenac ER 100 mg qd prn

## 2022-02-08 NOTE — Assessment & Plan Note (Signed)
BP Readings from Last 3 Encounters:  02/06/22 136/80  01/12/22 126/82  12/16/21 132/78   Stable, pt to continue medical treatment hct 25 mg qd, lisinopril 20 mg qd

## 2022-02-08 NOTE — Assessment & Plan Note (Signed)
Due for f/u carotid vascular study - will order

## 2022-02-08 NOTE — Assessment & Plan Note (Signed)
C/w mild CTS like symptoms, pt encouraged to not sleep with elbow bent, and also try left wrist splint qhs prn until improved, consider hand surgury if not improved in 2-3 wks

## 2022-02-08 NOTE — Assessment & Plan Note (Signed)
With hx of known PVD - for bilat LE ABI study

## 2022-02-08 NOTE — Assessment & Plan Note (Signed)
With recent worsening, for trazodone 50 mg qhs prn

## 2022-02-09 ENCOUNTER — Other Ambulatory Visit: Payer: Self-pay | Admitting: Primary Care

## 2022-02-09 DIAGNOSIS — E291 Testicular hypofunction: Secondary | ICD-10-CM | POA: Diagnosis not present

## 2022-02-16 DIAGNOSIS — R351 Nocturia: Secondary | ICD-10-CM | POA: Diagnosis not present

## 2022-02-16 DIAGNOSIS — N5201 Erectile dysfunction due to arterial insufficiency: Secondary | ICD-10-CM | POA: Diagnosis not present

## 2022-02-16 DIAGNOSIS — E291 Testicular hypofunction: Secondary | ICD-10-CM | POA: Diagnosis not present

## 2022-02-16 DIAGNOSIS — N401 Enlarged prostate with lower urinary tract symptoms: Secondary | ICD-10-CM | POA: Diagnosis not present

## 2022-02-19 ENCOUNTER — Ambulatory Visit (HOSPITAL_COMMUNITY)
Admission: RE | Admit: 2022-02-19 | Discharge: 2022-02-19 | Disposition: A | Discharge status: Home / Self Care | Payer: Medicare PPO | Source: Ambulatory Visit | Attending: Cardiology | Admitting: Cardiology

## 2022-02-19 ENCOUNTER — Ambulatory Visit (HOSPITAL_BASED_OUTPATIENT_CLINIC_OR_DEPARTMENT_OTHER)
Admission: RE | Admit: 2022-02-19 | Discharge: 2022-02-19 | Disposition: A | Discharge status: Home / Self Care | Payer: Medicare PPO | Source: Ambulatory Visit | Attending: Internal Medicine | Admitting: Internal Medicine

## 2022-02-19 ENCOUNTER — Other Ambulatory Visit: Payer: Self-pay | Admitting: Internal Medicine

## 2022-02-19 DIAGNOSIS — M79605 Pain in left leg: Secondary | ICD-10-CM | POA: Diagnosis not present

## 2022-02-19 DIAGNOSIS — I739 Peripheral vascular disease, unspecified: Secondary | ICD-10-CM | POA: Insufficient documentation

## 2022-02-19 DIAGNOSIS — I6523 Occlusion and stenosis of bilateral carotid arteries: Secondary | ICD-10-CM | POA: Diagnosis not present

## 2022-03-02 ENCOUNTER — Other Ambulatory Visit: Payer: Self-pay | Admitting: Internal Medicine

## 2022-03-02 NOTE — Telephone Encounter (Signed)
Please refill as per office routine med refill policy (all routine meds to be refilled for 3 mo or monthly (per pt preference) up to one year from last visit, then month to month grace period for 3 mo, then further med refills will have to be denied) ? ?

## 2022-03-05 ENCOUNTER — Ambulatory Visit (HOSPITAL_BASED_OUTPATIENT_CLINIC_OR_DEPARTMENT_OTHER): Payer: Medicare PPO | Attending: Primary Care | Admitting: Pulmonary Disease

## 2022-03-05 DIAGNOSIS — G4733 Obstructive sleep apnea (adult) (pediatric): Secondary | ICD-10-CM | POA: Insufficient documentation

## 2022-03-15 ENCOUNTER — Telehealth: Payer: Self-pay | Admitting: Pulmonary Disease

## 2022-03-15 NOTE — Telephone Encounter (Signed)
Call patient  Sleep study result  Date of study: 03/05/2022  Impression: Moderate obstructive sleep apnea  Recommendation: DME referral  Trial of BiPAP therapy on 20/16 cm H2O, FLEX 1 with a Large size [employer] Full Face Simplus mask and heated humidification.  Encourage weight loss measures  Follow-up in the office 4 to 6 weeks following initiation of treatment

## 2022-03-15 NOTE — Procedures (Signed)
POLYSOMNOGRAPHY  Last, First: Warren Weeks, Warren Weeks MRN: [id number] Gender: Male Age (years): 75 Weight (lbs): 253 DOB: [birth year] BMI: 37 Primary Care: No PCP Epworth Score: 11 Referring: Geraldo Pitter NP Technician: Baxter Flattery Interpreting: Laurin Coder MD Study Type: BiPAP Ordered Study Type: CPAP Study date: 03/05/2022 Location: [facility] CLINICAL INFORMATION Warren Weeks is a 76 year old Male and was referred to the sleep center for evaluation of N/A. Indications include Central Sleep Apnea, OSA.  MEDICATIONS Patient self administered medications include: MELATONIN. Medications administered during study include No sleep medicine administered.  SLEEP STUDY TECHNIQUE The patient underwent an attended overnight polysomnography titration to assess the effects of BIPAP therapy. The following variables were monitored: EEG(C4-A1, C3-A2, O1-A2, O2-A1), EOG, submental and leg EMG, ECG, oxyhemoglobin saturation by pulse oximetry, thoracic and abdominal respiratory effort belts, nasal/oral airflow by pressure sensor, body position sensor and snoring sensor. BIPAP pressure was titrated to eliminate apneas, hypopneas and oxygen desaturation. Hypopneas were scored per AASM definition IB (4% desaturation)  TECHNICIAN COMMENTS Comments added by Technician: Patient had difficulty initiating sleep. Comments added by Scorer: N/A SLEEP ARCHITECTURE The study was initiated at 11:28:10 PM and terminated at 5:28:44 AM. Total recorded time was 360.6 minutes. EEG confirmed total sleep time was 308.5 minutes yielding a sleep efficiency of 85.6%%. Sleep onset after lights out was 3.5 minutes with a REM latency of 162.0 minutes. The patient spent 7.1%% of the night in stage N1 sleep, 88.8%% in stage N2 sleep, 0.0%% in stage N3 and 4.1% in REM. The Arousal Index was 1.4/hour. RESPIRATORY PARAMETERS The overall AHI was 17.3 per hour, and the RDI was 17.3 events/hour with a central apnea index of 0.2per hour.  The most appropriate setting of BiPAP was 20/16 cm H2O. At this setting, the sleep efficiency was 32 % and the patient was supine for 0%. The AHI was 32.4 events per hour, and the RDI was 32.4 events/hour (with 0.2 central events) and the arousal index was 4.6 per hour.The oxygen nadir was 84.0% during sleep.    The cumulative time under 88% oxygen saturation was 5.5 minutes  LEG MOVEMENT DATA The total leg movements were 0 with a resulting leg movement index of 0.0. Associated arousal with leg movement index was 0.0. CARDIAC DATA The underlying cardiac rhythm was most consistent with sinus rhythm. Mean heart rate during sleep was 61.8 bpm. Additional rhythm abnormalities include None.  IMPRESSIONS - Moderate Obstructive Sleep apnea(OSA) Optimal pressure attained. - EKG showed no cardiac abnormalities. - The patient snored with soft snoring volume. - No significant periodic leg movements(PLMs) during sleep. However, no significant associated arousals.  DIAGNOSIS - Obstructive Sleep Apnea (G47.33)  RECOMMENDATIONS - Trial of BiPAP therapy on 20/16 cm H2O, FLEX 1 with a Large size Fisher[employer]Paykel Full Face Simplus mask and heated humidification. - Avoid alcohol, sedatives and other CNS depressants that may worsen sleep apnea and disrupt normal sleep architecture. - Sleep hygiene should be reviewed to assess factors that may improve sleep quality. - Weight management and regular exercise should be initiated or continued. - Return to Sleep Center for re-evaluation after 4 weeks of therapy  [Electronically signed] 03/15/2022 06:05 AM  Sherrilyn Rist MD NPI: [id number]

## 2022-03-17 NOTE — Telephone Encounter (Signed)
I called the patient and gave him the results of the sleep study. He wants to think on this matter and call his insurance before he gets the machine. Nothing further needed.

## 2022-03-19 ENCOUNTER — Other Ambulatory Visit: Payer: Self-pay | Admitting: Primary Care

## 2022-04-07 ENCOUNTER — Telehealth: Payer: Self-pay | Admitting: Primary Care

## 2022-04-07 DIAGNOSIS — G4733 Obstructive sleep apnea (adult) (pediatric): Secondary | ICD-10-CM

## 2022-04-08 NOTE — Telephone Encounter (Signed)
I called the patient back and he is agreeable to the CPAP and I have placed the order. He has made a follow up to go over his inhalers per his request. Nothing further needed.

## 2022-04-15 ENCOUNTER — Other Ambulatory Visit: Payer: Self-pay | Admitting: Pulmonary Disease

## 2022-04-15 ENCOUNTER — Other Ambulatory Visit: Payer: Self-pay | Admitting: Internal Medicine

## 2022-04-15 NOTE — Telephone Encounter (Signed)
Please refill as per office routine med refill policy (all routine meds to be refilled for 3 mo or monthly (per pt preference) up to one year from last visit, then month to month grace period for 3 mo, then further med refills will have to be denied) ? ?

## 2022-04-23 DIAGNOSIS — H5203 Hypermetropia, bilateral: Secondary | ICD-10-CM | POA: Diagnosis not present

## 2022-04-23 DIAGNOSIS — H524 Presbyopia: Secondary | ICD-10-CM | POA: Diagnosis not present

## 2022-04-23 DIAGNOSIS — H25813 Combined forms of age-related cataract, bilateral: Secondary | ICD-10-CM | POA: Diagnosis not present

## 2022-04-23 DIAGNOSIS — H52203 Unspecified astigmatism, bilateral: Secondary | ICD-10-CM | POA: Diagnosis not present

## 2022-04-27 ENCOUNTER — Encounter: Payer: Self-pay | Admitting: Pulmonary Disease

## 2022-04-27 ENCOUNTER — Ambulatory Visit: Payer: Medicare PPO | Admitting: Pulmonary Disease

## 2022-04-27 VITALS — BP 138/68 | HR 69 | Temp 97.9°F | Ht 69.0 in | Wt 256.2 lb

## 2022-04-27 DIAGNOSIS — J449 Chronic obstructive pulmonary disease, unspecified: Secondary | ICD-10-CM | POA: Diagnosis not present

## 2022-04-27 NOTE — Patient Instructions (Signed)
I will see you back in about 3 months  I hope you would have received your CPAP by then just to see how things are going  We will schedule the CT scan to follow-up on the scarring, emphysema in about 6 months from here  We will get a repeat breathing study in about the same time   Continue using your Advair as prescribed  Activities as tolerated  Call us with significant concerns

## 2022-04-27 NOTE — Progress Notes (Signed)
Warren Weeks    [id number]    [birth year]  Primary Care Physician:[name], [name], MD  Referring Physician: Biagio Borg, MD [address] [place],  [place] [address]  Chief complaint:   Patient being seen for shortness of breath  HPI:  Shortness of breath with activity  Limited more by neck and back pain, hip pain This limits his activities significantly  He is on Advair which he tries to use twice a day Inhaler technique was reviewed and discussed to optimize use  Was recently diagnosed with moderate obstructive sleep apnea, to start on BiPAP Still yet to receive machine because of a backlog  He does have a history of snoring, daytime sleepiness, insomnia, he does take melatonin  He did smoke in the past, quit many years ago  Retired from working in [employer]  Outpatient Encounter Medications as of 04/27/2022  Medication Sig   acetaminophen (TYLENOL) 650 MG CR tablet Take 650 mg by mouth every 8 (eight) hours as needed for pain.   aspirin 81 MG EC tablet Take 1 tablet (81 mg total) by mouth daily. Swallow whole.   Cyanocobalamin (B-12 PO) Take 1 tablet by mouth daily.   diclofenac Sodium (VOLTAREN) 1 % GEL Apply 2 g topically daily as needed (knee pain).   Diclofenac Sodium CR 100 MG 24 hr tablet Take 1 tablet (100 mg total) by mouth daily.   ezetimibe (ZETIA) 10 MG tablet Take 1 tablet by mouth once daily   fluticasone (FLONASE) 50 MCG/ACT nasal spray Use 2 spray(s) in each nostril once daily   fluticasone-salmeterol (ADVAIR) 250-50 MCG/ACT AEPB INHALE 1 DOSE BY MOUTH IN THE MORNING AND 1 IN THE EVENING   hydrochlorothiazide (HYDRODIURIL) 25 MG tablet Take 1 tablet by mouth once daily   lisinopril (ZESTRIL) 20 MG tablet Take 1 tablet by mouth once daily (Patient taking differently: Take 20 mg by mouth daily.)   Melatonin 10 MG TABS Take 10 mg by mouth at bedtime.    methocarbamol (ROBAXIN) 500 MG tablet Take 1 tablet (500 mg total) by mouth 2 (two) times  daily as needed.   metoprolol tartrate (LOPRESSOR) 50 MG tablet Take 1 tablet by mouth twice daily   Multiple Vitamin (ONE-A-DAY 55 PLUS PO) Take 1 tablet by mouth daily.   ofloxacin (OCUFLOX) 0.3 % ophthalmic solution Instill 1 drop TID in operative eye(s) starting 2 days prior to surgery and after surgery for 3 weeks.   Omega-3 Fatty Acids (FISH OIL) 1000 MG CAPS Take 1,000 mg by mouth daily.   omeprazole (PRILOSEC) 20 MG capsule Take 1 capsule by mouth twice daily   rosuvastatin (CRESTOR) 40 MG tablet Take 1 tablet by mouth once daily   sildenafil (VIAGRA) 100 MG tablet Take 1 tablet (100 mg total) by mouth as needed for erectile dysfunction.   solifenacin (VESICARE) 5 MG tablet Take 1 tablet (5 mg total) by mouth daily. (Patient taking differently: Take 5 mg by mouth daily as needed (bladder).)   tadalafil (CIALIS) 20 MG tablet Take 20 mg by mouth as needed.   terazosin (HYTRIN) 5 MG capsule Take 5 mg by mouth at bedtime.    Testosterone 20.25 MG/1.25GM (1.62%) GEL Apply 2 Packages topically daily.   thiamine 250 MG tablet Take 250 mg by mouth daily.   traZODone (DESYREL) 100 MG tablet Take 1 tablet (100 mg total) by mouth at bedtime as needed for sleep.   triamcinolone cream (KENALOG) 0.5 %  Apply 1 application topically 3 (three) times daily.   Turmeric 500 MG TABS Take 500 mg by mouth daily.   ketorolac (ACULAR) 0.5 % ophthalmic solution One drop in operative eye(s) TID starting 2 days before surgery and continuing 3 weeks after surgery. (Patient not taking: Reported on 04/27/2022)   prednisoLONE acetate (PRED FORTE) 1 % ophthalmic suspension One drop in operative eye(s) TID starting 2 days before surgery and continuing after surgery for 3 weeks (Patient not taking: Reported on 04/27/2022)   No facility-administered encounter medications on file as of 04/27/2022.    Allergies as of 04/27/2022 - Review Complete 04/27/2022  Allergen Reaction Noted   Cefuroxime axetil Rash 06/17/2009     Past Medical History:  Diagnosis Date   ALLERGIC RHINITIS 09/16/2007   ALLERGY 03/24/2007   Allergy    ANXIETY 03/28/2007   Arthritis    knees, back,shoulder   CAROTID ARTERY STENOSIS, BILATERAL 07/01/2009   yearly checks - no current problems   CEPHALGIA 06/08/2009   CHEST PAIN 09/16/2007   no current problems   COLONIC POLYPS, HX OF 09/16/2007   COPD (chronic obstructive pulmonary disease) ([place]) 05/12/2017   mild per patient - no inhaler   Dizziness and giddiness 06/08/2009   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   DYSPNEA/SHORTNESS OF BREATH 09/16/2007   ERECTILE DYSFUNCTION 03/28/2007   ESOPHAGEAL STRICTURE 09/16/2007   ESOPHAGITIS 03/24/2007   GERD 03/24/2007   GLUCOSE INTOLERANCE 09/16/2007   HEMORRHOIDS, INTERNAL 03/24/2007   HYPERCHOLESTEROLEMIA 03/24/2007   HYPERLIPIDEMIA 09/16/2007   HYPERTENSION 03/28/2007   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/2011   Impaired glucose tolerance 10/04/2010   LOW BACK PAIN 03/28/2007   MAGNETIC RESONANCE IMAGING, BRAIN, ABNORMAL 06/26/2009   NECK PAIN, LEFT 06/20/2010   OBESITY 03/24/2007   OTITIS MEDIA, LEFT 06/10/2009   Palpitations 06/20/2010   no current problems   PVD 06/25/2009   RASH-NONVESICULAR 06/17/2009   Sleep apnea 12/25/2021   URI 08/16/2009    Past Surgical History:  Procedure Laterality Date   COLONOSCOPY  03/2013   polyps/[place]   LEFT HEART CATH AND CORONARY ANGIOGRAPHY N/A 10/14/2021   Procedure: LEFT HEART CATH AND CORONARY ANGIOGRAPHY;  Surgeon: Jettie Booze, MD;  Location: [facility];  Service: Cardiovascular;  Laterality: N/A;   NASAL SEPTOPLASTY W/ TURBINOPLASTY Bilateral 01/03/2021   Procedure: NASAL SEPTOPLASTY WITH BILATERAL TURBINATE REDUCTION;  Surgeon: Leta Baptist, MD;  Location: [facility];  Service: ENT;  Laterality: Bilateral;   s/p right knee arthroscopy     UPPER GASTROINTESTINAL ENDOSCOPY     gerd    Family History  Problem Relation Age of Onset   Lymphoma Mother    Heart disease Father         CHF   Ovarian cancer Sister    Lung cancer Sister    Coronary artery disease Sister        CABG   Coronary artery disease Brother        stent, and CAD   Colon cancer Neg Hx    Esophageal cancer Neg Hx    Rectal cancer Neg Hx    Stomach cancer Neg Hx    Colon polyps Neg Hx     Social History   Socioeconomic History   Marital status: Married    Spouse name: Daisy   Number of children: 2   Years of education: Not on file   Highest education level: Not on file  Occupational History   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: [employer]  Tobacco Use   Smoking status: Former    Packs/day: 0.50    Years: 15.00    Total pack years: 7.50    Types: Cigarettes    Quit date: 06/23/1991    Years since quitting: 30.8   Smokeless tobacco: Never   Tobacco comments:    Quit 30 years ago  Vaping Use   Vaping Use: Never used  Substance and Sexual Activity   Alcohol use: Yes    Alcohol/week: 7.0 standard drinks of alcohol    Types: 7 Cans of beer per week    Comment: beer most days   Drug use: No   Sexual activity: Yes  Other Topics Concern   Not on file  Social History Narrative   Not on file   Social Determinants of Health   Financial Resource Strain: Low Risk  (08/13/2021)   Overall Financial Resource Strain (CARDIA)    Difficulty of Paying Living Expenses: Not hard at all  Food Insecurity: No Food Insecurity (08/13/2021)   Hunger Vital Sign    Worried About Running Out of Food in the Last Year: Never true    Ran Out of Food in the Last Year: Never true  Transportation Needs: No Transportation Needs (08/13/2021)   PRAPARE - Hydrologist (Medical): No    Lack of Transportation (Non-Medical): No  Physical Activity: Inactive (08/13/2021)   Exercise Vital Sign    Days of Exercise per Week: 0 days    Minutes of Exercise per Session: 0 min  Stress: No Stress Concern Present (08/13/2021)   [place]    Feeling of Stress : Not at all  Social Connections: [place] (08/13/2021)   Social Connection and Isolation Panel [NHANES]    Frequency of Communication with Friends and Family: More than three times a week    Frequency of Social Gatherings with Friends and Family: More than three times a week    Attends Religious Services: More than 4 times per year    Active Member of [employer] or Organizations: No    Attends Music therapist: More than 4 times per year    Marital Status: Married  Human resources officer Violence: Not At Risk (08/13/2021)   Humiliation, Afraid, Rape, and Kick questionnaire    Fear of Current or Ex-Partner: No    Emotionally Abused: No    Physically Abused: No    Sexually Abused: No    Review of Systems  Respiratory:  Positive for shortness of breath.   Musculoskeletal:  Positive for arthralgias and back pain.    Vitals:   04/27/22 0941  BP: 138/68  Pulse: 69  Temp: 97.9 F (36.6 C)  SpO2: 97%     Physical Exam Constitutional:      Appearance: He is obese.  HENT:     Head: Normocephalic.     Mouth/Throat:     Mouth: Mucous membranes are moist.  Cardiovascular:     Rate and Rhythm: Normal rate and regular rhythm.     Heart sounds: No murmur heard.    No friction rub.  Pulmonary:     Effort: No respiratory distress.     Breath sounds: No stridor. No wheezing or rhonchi.  Musculoskeletal:     Cervical back: No rigidity or tenderness.  Neurological:     Mental Status: He is alert.  Psychiatric:        Mood and Affect: Mood normal.  Data Reviewed: Recent cardiac CT reviewed showing mild emphysema, some scarring in the left lingula  Past pulmonary function test 12/16/2021-no obstructive or restrictive defect, normal diffusing capacity  Echocardiogram showed grade 1 diastolic dysfunction with mild elevation of pulmonary pressures  Most recent sleep study showing moderate obstructive sleep apnea,  titrated to BiPAP    Assessment:  Multifactorial shortness of breath  Emphysema on recent CT  Diastolic dysfunction on echo  Mild pulmonary hypertension -Likely related to heart disease and also possibly mild underlying lung disease -Obstructive sleep apnea  Plan/Recommendations: Await BiPAP set up  Repeat CT scan of the chest in about 6 months  Follow-up pulmonary function test in about 6 months  Graded activities as tolerated  Inhaler technique was reviewed  Follow-up in 3 months      Sherrilyn Rist MD [facility] 04/27/2022, 9:46 AM  CC: Biagio Borg, MD

## 2022-05-18 ENCOUNTER — Other Ambulatory Visit: Payer: Self-pay | Admitting: Pulmonary Disease

## 2022-05-20 ENCOUNTER — Other Ambulatory Visit: Payer: Self-pay | Admitting: Pulmonary Disease

## 2022-05-27 DIAGNOSIS — H25812 Combined forms of age-related cataract, left eye: Secondary | ICD-10-CM | POA: Diagnosis not present

## 2022-05-27 DIAGNOSIS — H25811 Combined forms of age-related cataract, right eye: Secondary | ICD-10-CM | POA: Diagnosis not present

## 2022-05-27 DIAGNOSIS — H2511 Age-related nuclear cataract, right eye: Secondary | ICD-10-CM | POA: Diagnosis not present

## 2022-05-28 ENCOUNTER — Other Ambulatory Visit: Payer: Medicare PPO

## 2022-06-05 ENCOUNTER — Ambulatory Visit
Admission: EM | Admit: 2022-06-05 | Discharge: 2022-06-05 | Disposition: A | Discharge status: Home / Self Care | Payer: Medicare PPO | Attending: Physician Assistant | Admitting: Physician Assistant

## 2022-06-05 DIAGNOSIS — J0110 Acute frontal sinusitis, unspecified: Secondary | ICD-10-CM

## 2022-06-05 DIAGNOSIS — J069 Acute upper respiratory infection, unspecified: Secondary | ICD-10-CM | POA: Diagnosis not present

## 2022-06-05 MED ORDER — AZITHROMYCIN 250 MG PO TABS: ORAL_TABLET | ORAL | 0 refills | Status: DC

## 2022-06-05 MED ORDER — PREDNISONE 10 MG PO TABS
10.0000 mg | ORAL_TABLET | Freq: Every day | ORAL | 0 refills | Status: DC
Start: 2022-06-05 — End: 2022-06-22

## 2022-06-05 MED ORDER — MOMETASONE FUROATE 50 MCG/ACT NA SUSP: 2.0000 | Freq: Every day | NASAL | 12 refills | Status: DC

## 2022-06-05 MED ORDER — TRIAMCINOLONE ACETONIDE 40 MG/ML IJ SUSP
40.0000 mg | Freq: Once | INTRAMUSCULAR | Status: AC
  Administered 2022-06-05: 40 mg via INTRAMUSCULAR

## 2022-06-05 NOTE — ED Provider Notes (Signed)
[facility]    CSN: [id number] Arrival date & time: 06/05/22  7408      History   Chief Complaint Chief Complaint  Patient presents with   sinus pressure     HPI Warren Weeks is a 76 y.o. male.   76 year old male presents with sinus congestion.  Patient relates for the past several days he has been having increased upper respiratory congestion with frontal maxillary sinus tenderness and pressure, purulent production which is green and gray.  Patient indicates he is also been having ear congestion with left ear pressure.  Patient relates he has been using some Flonase nasal spray on a regular basis that has not helped to decrease his sinus congestion.  Patient indicates he has not had any fever or chills.  He has had some mild chest congestion and mild intermittent cough without production.  Patient indicates he is tolerating fluids well.  He is concerned about having a sinus infection as he does have surgery scheduled within the next 10 days.     Past Medical History:  Diagnosis Date   ALLERGIC RHINITIS 09/16/2007   ALLERGY 03/24/2007   Allergy    ANXIETY 03/28/2007   Arthritis    knees, back,shoulder   CAROTID ARTERY STENOSIS, BILATERAL 07/01/2009   yearly checks - no current problems   CEPHALGIA 06/08/2009   CHEST PAIN 09/16/2007   no current problems   COLONIC POLYPS, HX OF 09/16/2007   COPD (chronic obstructive pulmonary disease) ([place]) 05/12/2017   mild per patient - no inhaler   Dizziness and giddiness 06/08/2009   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   DYSPNEA/SHORTNESS OF BREATH 09/16/2007   ERECTILE DYSFUNCTION 03/28/2007   ESOPHAGEAL STRICTURE 09/16/2007   ESOPHAGITIS 03/24/2007   GERD 03/24/2007   GLUCOSE INTOLERANCE 09/16/2007   HEMORRHOIDS, INTERNAL 03/24/2007   HYPERCHOLESTEROLEMIA 03/24/2007   HYPERLIPIDEMIA 09/16/2007   HYPERTENSION 03/28/2007   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/2011   Impaired glucose tolerance 10/04/2010   LOW BACK PAIN 03/28/2007   MAGNETIC  RESONANCE IMAGING, BRAIN, ABNORMAL 06/26/2009   NECK PAIN, LEFT 06/20/2010   OBESITY 03/24/2007   OTITIS MEDIA, LEFT 06/10/2009   Palpitations 06/20/2010   no current problems   PVD 06/25/2009   RASH-NONVESICULAR 06/17/2009   Sleep apnea 12/25/2021   URI 08/16/2009    Patient Active Problem List   Diagnosis Date Noted   Left leg pain 02/08/2022   Hoarseness 02/08/2022   Cramp in lower leg 02/08/2022   Left hand pain 02/08/2022   Sleep apnea 12/25/2021   Loud snoring 10/17/2021   Coronary artery calcification    Degenerative joint disease of cervical spine 03/12/2021   Acute sinus infection 12/25/2020   Iliotibial band syndrome affecting left lower leg 09/08/2020   Neck pain on right side 09/05/2020   OAB (overactive bladder) 07/05/2020   Pain and swelling of right lower leg 07/05/2020   Cervicalgia 07/05/2020   Right knee pain 07/05/2020   PVC's (premature ventricular contractions) 03/02/2020   BPH (benign prostatic hyperplasia) 03/02/2020   Arthritis 14/48/1856   Diastolic dysfunction 31/49/7026   Urinary frequency 09/14/2019   Urinary leakage 12/07/2018   Left knee pain 12/07/2018   Chronic sinusitis 10/05/2018   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   COPD (chronic obstructive pulmonary disease) ([place]) 05/12/2017   Pleurisy 03/16/2017   Bilateral leg pain 11/22/2016   Insomnia 10/13/2016   Cough 07/22/2016   Wheezing 07/22/2016   Peripheral edema 02/21/2016   Venous insufficiency 02/05/2016   Low back pain radiating to lower extremity 02/05/2016  Vertigo    Hypersomnolence 10/24/2015   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   Chest pain 01/18/2013   Hematochezia 10/12/2012   Viral illness 05/26/2012   Palpitations 01/05/2012   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/2011   Fatigue 04/09/2011   Rash 12/22/2010   Encounter for long-term (current) use of high-risk medication 10/08/2010   Impaired glucose tolerance 10/04/2010   Encounter for well adult exam with abnormal findings 10/04/2010    Carotid artery stenosis 07/01/2009   PVD 06/25/2009   Allergic rhinitis 09/16/2007   ESOPHAGEAL STRICTURE 09/16/2007   COLONIC POLYPS, HX OF 09/16/2007   Anxiety state 03/28/2007   ERECTILE DYSFUNCTION 03/28/2007   Hyperlipidemia 03/28/2007   Essential hypertension 03/28/2007   LOW BACK PAIN 03/28/2007   OBESITY 03/24/2007   HEMORRHOIDS, INTERNAL 03/24/2007   ESOPHAGITIS 03/24/2007   GERD 03/24/2007    Past Surgical History:  Procedure Laterality Date   COLONOSCOPY  03/2013   polyps/[place]   LEFT HEART CATH AND CORONARY ANGIOGRAPHY N/A 10/14/2021   Procedure: LEFT HEART CATH AND CORONARY ANGIOGRAPHY;  Surgeon: Jettie Booze, MD;  Location: [facility];  Service: Cardiovascular;  Laterality: N/A;   NASAL SEPTOPLASTY W/ TURBINOPLASTY Bilateral 01/03/2021   Procedure: NASAL SEPTOPLASTY WITH BILATERAL TURBINATE REDUCTION;  Surgeon: Leta Baptist, MD;  Location: [facility];  Service: ENT;  Laterality: Bilateral;   s/p right knee arthroscopy     UPPER GASTROINTESTINAL ENDOSCOPY     gerd       Home Medications    Prior to Admission medications   Medication Sig Start Date End Date Taking? Authorizing Provider  azithromycin (ZITHROMAX Z-PAK) 250 MG tablet 2 tablets initially, then 1 tablet daily until completed. 06/05/22  Yes Nyoka Lint, PA-C  mometasone (NASONEX) 50 MCG/ACT nasal spray Place 2 sprays into the nose daily. 06/05/22  Yes Nyoka Lint, PA-C  predniSONE (DELTASONE) 10 MG tablet Take 1 tablet (10 mg total) by mouth daily. 06/05/22  Yes Nyoka Lint, PA-C  acetaminophen (TYLENOL) 650 MG CR tablet Take 650 mg by mouth every 8 (eight) hours as needed for pain.    [provider]  aspirin 81 MG EC tablet Take 1 tablet (81 mg total) by mouth daily. Swallow whole. 10/07/21   Biagio Borg, MD  Cyanocobalamin (B-12 PO) Take 1 tablet by mouth daily.    [provider]  diclofenac Sodium (VOLTAREN) 1 % GEL Apply 2 g topically daily as needed  (knee pain).    [provider]  Diclofenac Sodium CR 100 MG 24 hr tablet Take 1 tablet (100 mg total) by mouth daily. 02/06/22   Biagio Borg, MD  ezetimibe (ZETIA) 10 MG tablet Take 1 tablet by mouth once daily 04/16/22   Biagio Borg, MD  fluticasone [facility]) 50 MCG/ACT nasal spray Use 2 spray(s) in each nostril once daily 01/15/22   Biagio Borg, MD  fluticasone-salmeterol (ADVAIR) 250-50 MCG/ACT AEPB INHALE 1 DOSE BY MOUTH ONCE DAILY IN THE MORNING AND ONCE DAILY IN THE EVENING 05/18/22   Sherrilyn Rist A, MD  hydrochlorothiazide (HYDRODIURIL) 25 MG tablet Take 1 tablet by mouth once daily 12/18/21   Biagio Borg, MD  ketorolac (ACULAR) 0.5 % ophthalmic solution One drop in operative eye(s) TID starting 2 days before surgery and continuing 3 weeks after surgery. Patient not taking: Reported on 04/27/2022 04/23/22   [provider]  lisinopril (ZESTRIL) 20 MG tablet Take 1 tablet by mouth once daily Patient taking differently: Take 20 mg by mouth daily. 05/06/21  Biagio Borg, MD  Melatonin 10 MG TABS Take 10 mg by mouth at bedtime.     [provider]  methocarbamol (ROBAXIN) 500 MG tablet Take 1 tablet (500 mg total) by mouth 2 (two) times daily as needed. 02/06/22   Biagio Borg, MD  metoprolol tartrate (LOPRESSOR) 50 MG tablet Take 1 tablet by mouth twice daily 03/02/22   Biagio Borg, MD  Multiple Vitamin (ONE-A-DAY 55 PLUS PO) Take 1 tablet by mouth daily.    [provider]  ofloxacin (OCUFLOX) 0.3 % ophthalmic solution Instill 1 drop TID in operative eye(s) starting 2 days prior to surgery and after surgery for 3 weeks. 04/23/22   [provider]  Omega-3 Fatty Acids (FISH OIL) 1000 MG CAPS Take 1,000 mg by mouth daily.    [provider]  omeprazole (PRILOSEC) 20 MG capsule Take 1 capsule by mouth twice daily 10/14/21   Biagio Borg, MD  prednisoLONE acetate (PRED FORTE) 1 % ophthalmic suspension One drop in operative eye(s) TID  starting 2 days before surgery and continuing after surgery for 3 weeks Patient not taking: Reported on 04/27/2022 04/23/22   [provider]  rosuvastatin (CRESTOR) 40 MG tablet Take 1 tablet by mouth once daily 10/02/21   Biagio Borg, MD  sildenafil (VIAGRA) 100 MG tablet Take 1 tablet (100 mg total) by mouth as needed for erectile dysfunction. 12/20/12   Biagio Borg, MD  solifenacin (VESICARE) 5 MG tablet Take 1 tablet (5 mg total) by mouth daily. Patient taking differently: Take 5 mg by mouth daily as needed (bladder). 07/05/20   Biagio Borg, MD  tadalafil (CIALIS) 20 MG tablet Take 20 mg by mouth as needed. 02/20/22   [provider]  terazosin (HYTRIN) 5 MG capsule Take 5 mg by mouth at bedtime.  03/22/15   [provider]  Testosterone 20.25 MG/1.25GM (1.62%) GEL Apply 2 Packages topically daily. 08/05/20   [provider]  thiamine 250 MG tablet Take 250 mg by mouth daily.    [provider]  traZODone (DESYREL) 100 MG tablet Take 1 tablet (100 mg total) by mouth at bedtime as needed for sleep. 02/06/22   Biagio Borg, MD  triamcinolone cream (KENALOG) 0.5 % Apply 1 application topically 3 (three) times daily. 12/07/19   Biagio Borg, MD  Turmeric 500 MG TABS Take 500 mg by mouth daily.    [provider]    Family History Family History  Problem Relation Age of Onset   Lymphoma Mother    Heart disease Father        CHF   Ovarian cancer Sister    Lung cancer Sister    Coronary artery disease Sister        CABG   Coronary artery disease Brother        stent, and CAD   Colon cancer Neg Hx    Esophageal cancer Neg Hx    Rectal cancer Neg Hx    Stomach cancer Neg Hx    Colon polyps Neg Hx     Social History Social History   Tobacco Use   Smoking status: Former    Packs/day: 0.50    Years: 15.00    Total pack years: 7.50    Types: Cigarettes    Quit date: 06/23/1991    Years since quitting: 30.9   Smokeless tobacco: Never    Tobacco comments:    Quit 30 years ago  Vaping Use  Vaping Use: Never used  Substance Use Topics   Alcohol use: Yes    Alcohol/week: 7.0 standard drinks of alcohol    Types: 7 Cans of beer per week    Comment: beer most days   Drug use: No     Allergies   Cefuroxime axetil   Review of Systems Review of Systems  HENT:  Positive for postnasal drip, sinus pressure and sinus pain.      Physical Exam Triage Vital Signs ED Triage Vitals  Enc Vitals Group     BP 06/05/22 0845 (!) 162/91     Pulse Rate 06/05/22 0843 70     Resp 06/05/22 0845 19     Temp 06/05/22 0843 97.7 F (36.5 C)     Temp Source 06/05/22 0843 Oral     SpO2 06/05/22 0843 94 %     Weight --      Height --      Head Circumference --      Peak Flow --      Pain Score 06/05/22 0847 0     Pain Loc --      Pain Edu? --      Excl. in [place]? --    No data found.  Updated Vital Signs BP (!) 162/91   Pulse 70   Temp 97.7 F (36.5 C) (Oral)   Resp 19   SpO2 94%   Visual Acuity Right Eye Distance:   Left Eye Distance:   Bilateral Distance:    Right Eye Near:   Left Eye Near:    Bilateral Near:     Physical Exam Constitutional:      Appearance: Normal appearance.  HENT:     Right Ear: Ear canal normal. Tympanic membrane is injected.     Left Ear: Ear canal normal. Tympanic membrane is injected.     Mouth/Throat:     Mouth: Mucous membranes are moist.     Pharynx: Oropharynx is clear.  Cardiovascular:     Rate and Rhythm: Normal rate and regular rhythm.     Heart sounds: Murmur heard.  Pulmonary:     Effort: Pulmonary effort is normal.     Breath sounds: Normal breath sounds and air entry. No wheezing, rhonchi or rales.  Lymphadenopathy:     Cervical: No cervical adenopathy.  Neurological:     Mental Status: He is alert.      UC Treatments / Results  Labs (all labs ordered are listed, but only abnormal results are displayed) Labs Reviewed - No data to  display  EKG   Radiology No results found.  Procedures Procedures (including critical care time)  Medications Ordered in UC Medications  triamcinolone acetonide (KENALOG-40) injection 40 mg (has no administration in time range)    Initial Impression / Assessment and Plan / UC Course  I have reviewed the triage vital signs and the nursing notes.  Pertinent labs & imaging results that were available during my care of the patient were reviewed by me and considered in my medical decision making (see chart for details).    Plan: 1.  The acute upper respiratory infection will be treated with the following: A.  Advised patient to continue using Nasonex nasal spray, 2 sprays each nostril once a day to help decrease sinus congestion.  This will be used in place of Flonase. 2.  The acute sinusitis will be treated with the following: A.  Triamcinolone 40 mg given IM in the office. B.  Zithromax 250  mg, 2 tablets initially then 1 tablet daily until completed. C.  Prednisone 10 mg every 8 hours for 5 days only to help reduce the sinus inflammatory response. 3.  Advised follow-up PCP or return to urgent care if symptoms fail to improve. Final Clinical Impressions(s) / UC Diagnoses   Final diagnoses:  Acute non-recurrent frontal sinusitis  Acute upper respiratory infection     Discharge Instructions      Advised to use the Nasonex, 2 sprays each nostril once a day to help decrease nasal congestion. Advised take the Zithromax 250 mg, 2 tablets initially then 1 tablet daily until completed to treat the sinus infection. Advised to take prednisone 10 mg, every 8 hours until completed to help reduce sinus inflammation. Advised follow-up PCP or return to urgent care if symptoms fail to improve.    ED Prescriptions     Medication Sig Dispense Auth. Provider   azithromycin (ZITHROMAX Z-PAK) 250 MG tablet 2 tablets initially, then 1 tablet daily until completed. 6 each Nyoka Lint, PA-C    mometasone (NASONEX) 50 MCG/ACT nasal spray Place 2 sprays into the nose daily. 1 each Nyoka Lint, PA-C   predniSONE (DELTASONE) 10 MG tablet Take 1 tablet (10 mg total) by mouth daily. 15 tablet Nyoka Lint, PA-C      PDMP not reviewed this encounter.   Nyoka Lint, PA-C 06/05/22 [phone]

## 2022-06-05 NOTE — Discharge Instructions (Signed)
Advised to use the Nasonex, 2 sprays each nostril once a day to help decrease nasal congestion. Advised take the Zithromax 250 mg, 2 tablets initially then 1 tablet daily until completed to treat the sinus infection. Advised to take prednisone 10 mg, every 8 hours until completed to help reduce sinus inflammation. Advised follow-up PCP or return to urgent care if symptoms fail to improve.

## 2022-06-05 NOTE — ED Triage Notes (Signed)
Pt presents to [facility] with co of sinus congestion since Sunday, pt reports green and grey and color. Has been taking tylenol for symptoms.

## 2022-06-07 ENCOUNTER — Other Ambulatory Visit: Payer: Self-pay | Admitting: Internal Medicine

## 2022-06-08 NOTE — Telephone Encounter (Signed)
Please refill as per office routine med refill policy (all routine meds to be refilled for 3 mo or monthly (per pt preference) up to one year from last visit, then month to month grace period for 3 mo, then further med refills will have to be denied) ? ?

## 2022-06-17 DIAGNOSIS — H2512 Age-related nuclear cataract, left eye: Secondary | ICD-10-CM | POA: Diagnosis not present

## 2022-06-17 DIAGNOSIS — H25012 Cortical age-related cataract, left eye: Secondary | ICD-10-CM | POA: Diagnosis not present

## 2022-06-18 ENCOUNTER — Other Ambulatory Visit: Payer: Self-pay | Admitting: Pulmonary Disease

## 2022-06-19 ENCOUNTER — Ambulatory Visit
Admission: EM | Admit: 2022-06-19 | Discharge: 2022-06-19 | Disposition: A | Discharge status: Home / Self Care | Payer: Medicare PPO | Attending: Physician Assistant | Admitting: Physician Assistant

## 2022-06-19 ENCOUNTER — Telehealth: Payer: Self-pay | Admitting: Physician Assistant

## 2022-06-19 DIAGNOSIS — R051 Acute cough: Secondary | ICD-10-CM

## 2022-06-19 DIAGNOSIS — J209 Acute bronchitis, unspecified: Secondary | ICD-10-CM | POA: Diagnosis not present

## 2022-06-19 MED ORDER — HYDROCODONE BIT-HOMATROP MBR 5-1.5 MG/5ML PO SOLN: 5.0000 mL | Freq: Four times a day (QID) | ORAL | 0 refills | Status: DC | PRN

## 2022-06-19 MED ORDER — ALBUTEROL SULFATE HFA 108 (90 BASE) MCG/ACT IN AERS: 2.0000 | INHALATION_SPRAY | Freq: Four times a day (QID) | RESPIRATORY_TRACT | 0 refills | Status: DC | PRN

## 2022-06-19 MED ORDER — HYDROCODONE BIT-HOMATROP MBR 5-1.5 MG/5ML PO SOLN
5.0000 mL | Freq: Four times a day (QID) | ORAL | 0 refills | Status: DC | PRN
Start: 2022-06-19 — End: 2022-06-19

## 2022-06-19 MED ORDER — ALBUTEROL SULFATE HFA 108 (90 BASE) MCG/ACT IN AERS: 1.0000 | INHALATION_SPRAY | Freq: Four times a day (QID) | RESPIRATORY_TRACT | 0 refills | Status: DC | PRN

## 2022-06-19 NOTE — Discharge Instructions (Signed)
Advise using albuterol inhaler, 2 puffs every 6 hours on a regular basis to help decrease the cough and chest congestion. Advised to use the Hycodan cough syrup, 1 to 2 teaspoons every 6-8 hours as needed for cough congestion.  (Use this medication with caution as it does cause drowsiness therefore it is advised to use it when at home and at bedtime.) Advised to follow-up with PCP return to urgent care if symptoms fail to improve.

## 2022-06-19 NOTE — ED Provider Notes (Signed)
[facility]    CSN: [id number] Arrival date & time: 06/19/22  5248      History   Chief Complaint Chief Complaint  Patient presents with   Cough    HPI Warren Weeks is a 76 y.o. male.   76 year old male presents with cough and chest congestion.  Patient indicates for the past 2 and half weeks he has continued to have intermittent cough with chest congestion.  Patient indicates that the congestion he has having is thick and yellow.  He indicates he has having coughing exacerbations on fits which tend to keep him up at night patient indicates he has not able to use the CPAP machine due to the coughing episodes that he has of the evening.  He does indicate that he is using Delsym cough syrup but this is not controlling his symptoms.  Patient denies any wheezing or shortness of breath.  Patient indicates he continues to use his Advair inhaler twice daily.  Patient indicates he still has some mild upper respiratory congestion but he is not having any fever, chills, body aches or pain.  Patient denies nausea or vomiting.   Cough   Past Medical History:  Diagnosis Date   ALLERGIC RHINITIS 09/16/2007   ALLERGY 03/24/2007   Allergy    ANXIETY 03/28/2007   Arthritis    knees, back,shoulder   CAROTID ARTERY STENOSIS, BILATERAL 07/01/2009   yearly checks - no current problems   CEPHALGIA 06/08/2009   CHEST PAIN 09/16/2007   no current problems   COLONIC POLYPS, HX OF 09/16/2007   COPD (chronic obstructive pulmonary disease) ([place]) 05/12/2017   mild per patient - no inhaler   Dizziness and giddiness 06/08/2009   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   DYSPNEA/SHORTNESS OF BREATH 09/16/2007   ERECTILE DYSFUNCTION 03/28/2007   ESOPHAGEAL STRICTURE 09/16/2007   ESOPHAGITIS 03/24/2007   GERD 03/24/2007   GLUCOSE INTOLERANCE 09/16/2007   HEMORRHOIDS, INTERNAL 03/24/2007   HYPERCHOLESTEROLEMIA 03/24/2007   HYPERLIPIDEMIA 09/16/2007   HYPERTENSION 03/28/2007   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/2011    Impaired glucose tolerance 10/04/2010   LOW BACK PAIN 03/28/2007   MAGNETIC RESONANCE IMAGING, BRAIN, ABNORMAL 06/26/2009   NECK PAIN, LEFT 06/20/2010   OBESITY 03/24/2007   OTITIS MEDIA, LEFT 06/10/2009   Palpitations 06/20/2010   no current problems   PVD 06/25/2009   RASH-NONVESICULAR 06/17/2009   Sleep apnea 12/25/2021   URI 08/16/2009    Patient Active Problem List   Diagnosis Date Noted   Left leg pain 02/08/2022   Hoarseness 02/08/2022   Cramp in lower leg 02/08/2022   Left hand pain 02/08/2022   Sleep apnea 12/25/2021   Loud snoring 10/17/2021   Coronary artery calcification    Degenerative joint disease of cervical spine 03/12/2021   Acute sinus infection 12/25/2020   Iliotibial band syndrome affecting left lower leg 09/08/2020   Neck pain on right side 09/05/2020   OAB (overactive bladder) 07/05/2020   Pain and swelling of right lower leg 07/05/2020   Cervicalgia 07/05/2020   Right knee pain 07/05/2020   PVC's (premature ventricular contractions) 03/02/2020   BPH (benign prostatic hyperplasia) 03/02/2020   Arthritis 18/59/0931   Diastolic dysfunction 06/06/2445   Urinary frequency 09/14/2019   Urinary leakage 12/07/2018   Left knee pain 12/07/2018   Chronic sinusitis 10/05/2018   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   COPD (chronic obstructive pulmonary disease) ([place]) 05/12/2017   Pleurisy 03/16/2017   Bilateral leg pain 11/22/2016   Insomnia 10/13/2016   Cough 07/22/2016   Wheezing  07/22/2016   Peripheral edema 02/21/2016   Venous insufficiency 02/05/2016   Low back pain radiating to lower extremity 02/05/2016   Vertigo    Hypersomnolence 10/24/2015   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   Chest pain 01/18/2013   Hematochezia 10/12/2012   Viral illness 05/26/2012   Palpitations 01/05/2012   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/2011   Fatigue 04/09/2011   Rash 12/22/2010   Encounter for long-term (current) use of high-risk medication 10/08/2010   Impaired glucose tolerance  10/04/2010   Encounter for well adult exam with abnormal findings 10/04/2010   Carotid artery stenosis 07/01/2009   PVD 06/25/2009   Allergic rhinitis 09/16/2007   ESOPHAGEAL STRICTURE 09/16/2007   COLONIC POLYPS, HX OF 09/16/2007   Anxiety state 03/28/2007   ERECTILE DYSFUNCTION 03/28/2007   Hyperlipidemia 03/28/2007   Essential hypertension 03/28/2007   LOW BACK PAIN 03/28/2007   OBESITY 03/24/2007   HEMORRHOIDS, INTERNAL 03/24/2007   ESOPHAGITIS 03/24/2007   GERD 03/24/2007    Past Surgical History:  Procedure Laterality Date   COLONOSCOPY  03/2013   polyps/[place]   LEFT HEART CATH AND CORONARY ANGIOGRAPHY N/A 10/14/2021   Procedure: LEFT HEART CATH AND CORONARY ANGIOGRAPHY;  Surgeon: Jettie Booze, MD;  Location: [facility];  Service: Cardiovascular;  Laterality: N/A;   NASAL SEPTOPLASTY W/ TURBINOPLASTY Bilateral 01/03/2021   Procedure: NASAL SEPTOPLASTY WITH BILATERAL TURBINATE REDUCTION;  Surgeon: Leta Baptist, MD;  Location: [facility];  Service: ENT;  Laterality: Bilateral;   s/p right knee arthroscopy     UPPER GASTROINTESTINAL ENDOSCOPY     [facility]       Home Medications    Prior to Admission medications   Medication Sig Start Date End Date Taking? Authorizing Provider  albuterol (VENTOLIN HFA) 108 (90 Base) MCG/ACT inhaler Inhale 2 puffs into the lungs every 6 (six) hours as needed for wheezing or shortness of breath. 06/19/22  Yes Nyoka Lint, PA-C  acetaminophen (TYLENOL) 650 MG CR tablet Take 650 mg by mouth every 8 (eight) hours as needed for pain.    [provider]  aspirin 81 MG EC tablet Take 1 tablet (81 mg total) by mouth daily. Swallow whole. 10/07/21   Biagio Borg, MD  azithromycin (ZITHROMAX Z-PAK) 250 MG tablet 2 tablets initially, then 1 tablet daily until completed. 06/05/22   Nyoka Lint, PA-C  Cyanocobalamin (B-12 PO) Take 1 tablet by mouth daily.    [provider]  diclofenac Sodium (VOLTAREN) 1 % GEL  Apply 2 g topically daily as needed (knee pain).    [provider]  Diclofenac Sodium CR 100 MG 24 hr tablet Take 1 tablet (100 mg total) by mouth daily. 02/06/22   Biagio Borg, MD  ezetimibe (ZETIA) 10 MG tablet Take 1 tablet by mouth once daily 04/16/22   Biagio Borg, MD  fluticasone [facility]) 50 MCG/ACT nasal spray Use 2 spray(s) in each nostril once daily 01/15/22   Biagio Borg, MD  fluticasone-salmeterol (ADVAIR) 250-50 MCG/ACT AEPB INHALE 1 DOSE BY MOUTH ONCE DAILY IN THE MORNING AND THEN 1 PUFF BY MOUTH IN THE EVENING 06/18/22   Sherrilyn Rist A, MD  hydrochlorothiazide (HYDRODIURIL) 25 MG tablet Take 1 tablet by mouth once daily 12/18/21   Biagio Borg, MD  HYDROcodone bit-homatropine (HYCODAN) 5-1.5 MG/5ML syrup Take 5 mLs by mouth every 6 (six) hours as needed for cough. 06/19/22   Nyoka Lint, PA-C  ketorolac (ACULAR) 0.5 % ophthalmic solution One drop in operative eye(s) TID starting 2  days before surgery and continuing 3 weeks after surgery. Patient not taking: Reported on 04/27/2022 04/23/22   [provider]  lisinopril (ZESTRIL) 20 MG tablet Take 1 tablet by mouth once daily Patient taking differently: Take 20 mg by mouth daily. 05/06/21   Biagio Borg, MD  Melatonin 10 MG TABS Take 10 mg by mouth at bedtime.     [provider]  methocarbamol (ROBAXIN) 500 MG tablet Take 1 tablet (500 mg total) by mouth 2 (two) times daily as needed. 02/06/22   Biagio Borg, MD  metoprolol tartrate (LOPRESSOR) 50 MG tablet Take 1 tablet by mouth twice daily 06/08/22   Biagio Borg, MD  mometasone (NASONEX) 50 MCG/ACT nasal spray Place 2 sprays into the nose daily. 06/05/22   Nyoka Lint, PA-C  Multiple Vitamin (ONE-A-DAY 55 PLUS PO) Take 1 tablet by mouth daily.    [provider]  ofloxacin (OCUFLOX) 0.3 % ophthalmic solution Instill 1 drop TID in operative eye(s) starting 2 days prior to surgery and after surgery for 3 weeks. 04/23/22   [provider]  Omega-3 Fatty Acids (FISH OIL) 1000 MG CAPS Take 1,000 mg by mouth daily.    [provider]  omeprazole (PRILOSEC) 20 MG capsule Take 1 capsule by mouth twice daily 10/14/21   Biagio Borg, MD  prednisoLONE acetate (PRED FORTE) 1 % ophthalmic suspension One drop in operative eye(s) TID starting 2 days before surgery and continuing after surgery for 3 weeks Patient not taking: Reported on 04/27/2022 04/23/22   [provider]  predniSONE (DELTASONE) 10 MG tablet Take 1 tablet (10 mg total) by mouth daily. 06/05/22   Nyoka Lint, PA-C  rosuvastatin (CRESTOR) 40 MG tablet Take 1 tablet by mouth once daily 10/02/21   Biagio Borg, MD  sildenafil (VIAGRA) 100 MG tablet Take 1 tablet (100 mg total) by mouth as needed for erectile dysfunction. 12/20/12   Biagio Borg, MD  solifenacin (VESICARE) 5 MG tablet Take 1 tablet (5 mg total) by mouth daily. Patient taking differently: Take 5 mg by mouth daily as needed (bladder). 07/05/20   Biagio Borg, MD  tadalafil (CIALIS) 20 MG tablet Take 20 mg by mouth as needed. 02/20/22   [provider]  terazosin (HYTRIN) 5 MG capsule Take 5 mg by mouth at bedtime.  03/22/15   [provider]  Testosterone 20.25 MG/1.25GM (1.62%) GEL Apply 2 Packages topically daily. 08/05/20   [provider]  thiamine 250 MG tablet Take 250 mg by mouth daily.    [provider]  traZODone (DESYREL) 100 MG tablet Take 1 tablet (100 mg total) by mouth at bedtime as needed for sleep. 02/06/22   Biagio Borg, MD  triamcinolone cream (KENALOG) 0.5 % Apply 1 application topically 3 (three) times daily. 12/07/19   Biagio Borg, MD  Turmeric 500 MG TABS Take 500 mg by mouth daily.    [provider]    Family History Family History  Problem Relation Age of Onset   Lymphoma Mother    Heart disease Father        CHF   Ovarian cancer Sister    Lung cancer Sister    Coronary artery disease Sister        CABG    Coronary artery disease Brother        stent, and CAD   Colon cancer Neg Hx    Esophageal cancer Neg Hx    Rectal cancer  Neg Hx    Stomach cancer Neg Hx    Colon polyps Neg Hx     Social History Social History   Tobacco Use   Smoking status: Former    Packs/day: 0.50    Years: 15.00    Total pack years: 7.50    Types: Cigarettes    Quit date: 06/23/1991    Years since quitting: 31.0   Smokeless tobacco: Never   Tobacco comments:    Quit 30 years ago  Vaping Use   Vaping Use: Never used  Substance Use Topics   Alcohol use: Yes    Alcohol/week: 7.0 standard drinks of alcohol    Types: 7 Cans of beer per week    Comment: beer most days   Drug use: No     Allergies   Cefuroxime axetil   Review of Systems Review of Systems  HENT:  Positive for postnasal drip.   Respiratory:  Positive for cough.      Physical Exam Triage Vital Signs ED Triage Vitals  Enc Vitals Group     BP --      Pulse Rate 06/19/22 1111 (!) 54     Resp 06/19/22 1111 19     Temp 06/19/22 1111 97.7 F (36.5 C)     Temp src --      SpO2 06/19/22 1112 98 %     Weight --      Height --      Head Circumference --      Peak Flow --      Pain Score 06/19/22 1110 0     Pain Loc --      Pain Edu? --      Excl. in [place]? --    No data found.  Updated Vital Signs Pulse (!) 54   Temp 97.7 F (36.5 C)   Resp 19   SpO2 98%   Visual Acuity Right Eye Distance:   Left Eye Distance:   Bilateral Distance:    Right Eye Near:   Left Eye Near:    Bilateral Near:     Physical Exam Constitutional:      Appearance: Normal appearance.  HENT:     Right Ear: Tympanic membrane and ear canal normal.     Left Ear: Tympanic membrane and ear canal normal.     Mouth/Throat:     Mouth: Mucous membranes are moist.     Pharynx: Oropharynx is clear. Uvula midline.  Cardiovascular:     Rate and Rhythm: Normal rate and regular rhythm.     Heart sounds: Normal heart sounds.  Pulmonary:     Effort:  Pulmonary effort is normal.     Breath sounds: Normal breath sounds and air entry. No wheezing, rhonchi or rales.  Lymphadenopathy:     Cervical: No cervical adenopathy.  Neurological:     Mental Status: He is alert.      UC Treatments / Results  Labs (all labs ordered are listed, but only abnormal results are displayed) Labs Reviewed - No data to display  EKG   Radiology No results found.  Procedures Procedures (including critical care time)  Medications Ordered in UC Medications - No data to display  Initial Impression / Assessment and Plan / UC Course  I have reviewed the triage vital signs and the nursing notes.  Pertinent labs & imaging results that were available during my care of the patient were reviewed by me and considered in my medical decision making (see chart  for details).    Plan: The acute bronchitis will be treated with the following: A.  Hycodan cough syrup, 1 to 2 teaspoons every 6-8 hours as needed for cough mainly of the evening and at bedtime. B.  Albuterol inhaler, 2 puffs every 6 hours on a regular basis to help decrease cough and chest congestion. C.  Patient advised to continue using the Advair discus inhaler on a regular basis. 2.  The acute cough will be treated with the following: A.  Hycodan cough syrup, 1 to 2 teaspoons every 6-8 hours needed for cough. 3.  Advised follow-up PCP or return to urgent care if symptoms fail to improve. Final Clinical Impressions(s) / UC Diagnoses   Final diagnoses:  Acute bronchitis, unspecified organism  Acute cough     Discharge Instructions      Advise using albuterol inhaler, 2 puffs every 6 hours on a regular basis to help decrease the cough and chest congestion. Advised to use the Hycodan cough syrup, 1 to 2 teaspoons every 6-8 hours as needed for cough congestion.  (Use this medication with caution as it does cause drowsiness therefore it is advised to use it when at home and at bedtime.) Advised  to follow-up with PCP return to urgent care if symptoms fail to improve.    ED Prescriptions     Medication Sig Dispense Auth. Provider   HYDROcodone bit-homatropine (HYCODAN) 5-1.5 MG/5ML syrup  (Status: Discontinued) Take 5 mLs by mouth every 6 (six) hours as needed for cough. 120 mL Nyoka Lint, PA-C   HYDROcodone bit-homatropine (HYCODAN) 5-1.5 MG/5ML syrup  (Status: Discontinued) Take 5 mLs by mouth every 6 (six) hours as needed for cough. 120 mL Nyoka Lint, PA-C   HYDROcodone bit-homatropine (HYCODAN) 5-1.5 MG/5ML syrup Take 5 mLs by mouth every 6 (six) hours as needed for cough. 120 mL Nyoka Lint, PA-C   albuterol (VENTOLIN HFA) 108 (90 Base) MCG/ACT inhaler Inhale 2 puffs into the lungs every 6 (six) hours as needed for wheezing or shortness of breath. 8 g Nyoka Lint, PA-C      I have reviewed the PDMP during this encounter.   Nyoka Lint, PA-C 06/19/22 1136

## 2022-06-19 NOTE — ED Triage Notes (Signed)
Pt presents to uc with cough since last visit on 12/15. Pt reports he is not coughing up green phlegm pt reports difficulty sleeping with cpap on. Pt has been taking delsym cough syrup with no improvement.

## 2022-06-22 ENCOUNTER — Other Ambulatory Visit: Payer: Self-pay

## 2022-06-22 ENCOUNTER — Encounter (HOSPITAL_COMMUNITY): Payer: Self-pay | Admitting: Pharmacy Technician

## 2022-06-22 ENCOUNTER — Emergency Department (HOSPITAL_COMMUNITY)
Admission: EM | Admit: 2022-06-22 | Discharge: 2022-06-22 | Disposition: A | Discharge status: Home / Self Care | Payer: Medicare PPO | Attending: Emergency Medicine | Admitting: Emergency Medicine

## 2022-06-22 ENCOUNTER — Emergency Department (HOSPITAL_COMMUNITY): Payer: Medicare PPO

## 2022-06-22 DIAGNOSIS — R0602 Shortness of breath: Secondary | ICD-10-CM | POA: Diagnosis not present

## 2022-06-22 DIAGNOSIS — I10 Essential (primary) hypertension: Secondary | ICD-10-CM | POA: Insufficient documentation

## 2022-06-22 DIAGNOSIS — Z7982 Long term (current) use of aspirin: Secondary | ICD-10-CM | POA: Insufficient documentation

## 2022-06-22 DIAGNOSIS — Z79899 Other long term (current) drug therapy: Secondary | ICD-10-CM | POA: Insufficient documentation

## 2022-06-22 DIAGNOSIS — R059 Cough, unspecified: Secondary | ICD-10-CM | POA: Diagnosis not present

## 2022-06-22 DIAGNOSIS — J449 Chronic obstructive pulmonary disease, unspecified: Secondary | ICD-10-CM | POA: Insufficient documentation

## 2022-06-22 DIAGNOSIS — J111 Influenza due to unidentified influenza virus with other respiratory manifestations: Secondary | ICD-10-CM

## 2022-06-22 DIAGNOSIS — Z1152 Encounter for screening for COVID-19: Secondary | ICD-10-CM | POA: Diagnosis not present

## 2022-06-22 DIAGNOSIS — J40 Bronchitis, not specified as acute or chronic: Secondary | ICD-10-CM | POA: Insufficient documentation

## 2022-06-22 DIAGNOSIS — J101 Influenza due to other identified influenza virus with other respiratory manifestations: Secondary | ICD-10-CM | POA: Insufficient documentation

## 2022-06-22 DIAGNOSIS — Z7951 Long term (current) use of inhaled steroids: Secondary | ICD-10-CM | POA: Diagnosis not present

## 2022-06-22 LAB — BASIC METABOLIC PANEL
Anion gap: 10 (ref 5–15)
BUN: 10 mg/dL (ref 8–23)
CO2: 24 mmol/L (ref 22–32)
Calcium: 8.4 mg/dL — ABNORMAL LOW (ref 8.9–10.3)
Chloride: 107 mmol/L (ref 98–111)
Creatinine, Ser: 1.01 mg/dL (ref 0.61–1.24)
GFR, Estimated: >60 mL/min (ref >60)
Glucose, Bld: 112 mg/dL — ABNORMAL HIGH (ref 70–99)
Potassium: 3.5 mmol/L (ref 3.5–5.1)
Sodium: 141 mmol/L (ref 135–145)

## 2022-06-22 LAB — BRAIN NATRIURETIC PEPTIDE: B Natriuretic Peptide: 160.4 pg/mL — ABNORMAL HIGH (ref 0.0–100.0)

## 2022-06-22 LAB — CBC
HCT: 42.9 % (ref 39.0–52.0)
Hemoglobin: 14.1 g/dL (ref 13.0–17.0)
MCH: 30.6 pg (ref 26.0–34.0)
MCHC: 32.9 g/dL (ref 30.0–36.0)
MCV: 93.1 fL (ref 80.0–100.0)
Platelets: 123 10*3/uL — ABNORMAL LOW (ref 150–400)
RBC: 4.61 MIL/uL (ref 4.22–5.81)
RDW: 13.8 % (ref 11.5–15.5)
WBC: 7 10*3/uL (ref 4.0–10.5)
nRBC: 0 % (ref 0.0–0.2)

## 2022-06-22 LAB — RESP PANEL BY RT-PCR (RSV, FLU A&B, COVID)  RVPGX2
Influenza A by PCR: NEGATIVE
Influenza B by PCR: POSITIVE — AB
Resp Syncytial Virus by PCR: NEGATIVE
SARS Coronavirus 2 by RT PCR: NEGATIVE

## 2022-06-22 LAB — TROPONIN I (HIGH SENSITIVITY): Troponin I (High Sensitivity): 8 ng/L (ref <18)

## 2022-06-22 MED ORDER — BENZONATATE 100 MG PO CAPS: 100.0000 mg | ORAL_CAPSULE | Freq: Three times a day (TID) | ORAL | 0 refills | Status: DC

## 2022-06-22 MED ORDER — PREDNISONE 20 MG PO TABS
60.0000 mg | ORAL_TABLET | Freq: Once | ORAL | Status: AC
  Administered 2022-06-22: 60 mg via ORAL
  Filled 2022-06-22: qty 3

## 2022-06-22 MED ORDER — PREDNISONE 20 MG PO TABS: 40.0000 mg | ORAL_TABLET | Freq: Every day | ORAL | 0 refills | Status: DC

## 2022-06-22 NOTE — ED Provider Notes (Signed)
[facility] EMERGENCY DEPARTMENT Provider Note   CSN: [id number] Arrival date & time: 06/22/22  1325     History  Chief Complaint  Patient presents with   Cough    Warren Weeks is a 77 y.o. male.  Patient is a 77 year old male with a history of esophagitis, COPD, hyperlipidemia and hypertension who is presenting today with persistent cough and some shortness of breath.  Patient reports this started about 2 and half weeks ago with congestion and productive cough.  2 weeks ago he saw urgent care and was given an azithromycin prescription and prednisone.  He reports he initially started feeling a little better after that but then his symptoms returned.  He went back to urgent care and they gave in an inhaler last week.  However he reports when he lays down at night he just continues to cough and he is still coughing up sinus congestion.  He has not had a fever but feels tightness in his chest with coughing and with activity when he tries to breathe.  He notices some shortness of breath with activity.  He has chronic swelling in his legs but does not feel that it is significantly worse than baseline.  He reports he does have some diuretics at home but he only takes them as needed because they make him urinate so often.  He has not had a fever.  The history is provided by the patient.  Cough      Home Medications Prior to Admission medications   Medication Sig Start Date End Date Taking? Authorizing Provider  benzonatate (TESSALON) 100 MG capsule Take 1 capsule (100 mg total) by mouth every 8 (eight) hours. 06/22/22  Yes Knowledge Escandon, Loree Fee, MD  predniSONE (DELTASONE) 20 MG tablet Take 2 tablets (40 mg total) by mouth daily. 06/22/22  Yes Blanchie Dessert, MD  acetaminophen (TYLENOL) 650 MG CR tablet Take 650 mg by mouth every 8 (eight) hours as needed for pain.    [provider]  albuterol (VENTOLIN HFA) 108 (90 Base) MCG/ACT inhaler Inhale 2 puffs into the lungs  every 6 (six) hours as needed for wheezing or shortness of breath. 06/19/22   Nyoka Lint, PA-C  albuterol (VENTOLIN HFA) 108 (90 Base) MCG/ACT inhaler Inhale 1-2 puffs into the lungs every 6 (six) hours as needed for wheezing or shortness of breath. 06/19/22   Nyoka Lint, PA-C  aspirin 81 MG EC tablet Take 1 tablet (81 mg total) by mouth daily. Swallow whole. 10/07/21   Biagio Borg, MD  azithromycin (ZITHROMAX Z-PAK) 250 MG tablet 2 tablets initially, then 1 tablet daily until completed. 06/05/22   Nyoka Lint, PA-C  Cyanocobalamin (B-12 PO) Take 1 tablet by mouth daily.    [provider]  diclofenac Sodium (VOLTAREN) 1 % GEL Apply 2 g topically daily as needed (knee pain).    [provider]  Diclofenac Sodium CR 100 MG 24 hr tablet Take 1 tablet (100 mg total) by mouth daily. 02/06/22   Biagio Borg, MD  ezetimibe (ZETIA) 10 MG tablet Take 1 tablet by mouth once daily 04/16/22   Biagio Borg, MD  fluticasone [facility]) 50 MCG/ACT nasal spray Use 2 spray(s) in each nostril once daily 01/15/22   Biagio Borg, MD  fluticasone-salmeterol (ADVAIR) 250-50 MCG/ACT AEPB INHALE 1 DOSE BY MOUTH ONCE DAILY IN THE MORNING AND THEN 1 PUFF BY MOUTH IN THE EVENING 06/18/22   Olalere, Adewale A, MD  hydrochlorothiazide (HYDRODIURIL) 25 MG tablet  Take 1 tablet by mouth once daily 12/18/21   Biagio Borg, MD  HYDROcodone bit-homatropine [facility]) 5-1.5 MG/5ML syrup Take 5 mLs by mouth every 6 (six) hours as needed for cough. 06/19/22   Nyoka Lint, PA-C  HYDROcodone bit-homatropine (HYCODAN) 5-1.5 MG/5ML syrup Take 5 mLs by mouth every 6 (six) hours as needed for cough. 06/19/22   Nyoka Lint, PA-C  ketorolac (ACULAR) 0.5 % ophthalmic solution One drop in operative eye(s) TID starting 2 days before surgery and continuing 3 weeks after surgery. Patient not taking: Reported on 04/27/2022 04/23/22   [provider]  lisinopril (ZESTRIL) 20 MG tablet Take 1 tablet by mouth once  daily Patient taking differently: Take 20 mg by mouth daily. 05/06/21   Biagio Borg, MD  Melatonin 10 MG TABS Take 10 mg by mouth at bedtime.     [provider]  methocarbamol (ROBAXIN) 500 MG tablet Take 1 tablet (500 mg total) by mouth 2 (two) times daily as needed. 02/06/22   Biagio Borg, MD  metoprolol tartrate (LOPRESSOR) 50 MG tablet Take 1 tablet by mouth twice daily 06/08/22   Biagio Borg, MD  mometasone (NASONEX) 50 MCG/ACT nasal spray Place 2 sprays into the nose daily. 06/05/22   Nyoka Lint, PA-C  Multiple Vitamin (ONE-A-DAY 55 PLUS PO) Take 1 tablet by mouth daily.    [provider]  ofloxacin (OCUFLOX) 0.3 % ophthalmic solution Instill 1 drop TID in operative eye(s) starting 2 days prior to surgery and after surgery for 3 weeks. 04/23/22   [provider]  Omega-3 Fatty Acids (FISH OIL) 1000 MG CAPS Take 1,000 mg by mouth daily.    [provider]  omeprazole (PRILOSEC) 20 MG capsule Take 1 capsule by mouth twice daily 10/14/21   Biagio Borg, MD  prednisoLONE acetate (PRED FORTE) 1 % ophthalmic suspension One drop in operative eye(s) TID starting 2 days before surgery and continuing after surgery for 3 weeks Patient not taking: Reported on 04/27/2022 04/23/22   [provider]  rosuvastatin (CRESTOR) 40 MG tablet Take 1 tablet by mouth once daily 10/02/21   Biagio Borg, MD  sildenafil (VIAGRA) 100 MG tablet Take 1 tablet (100 mg total) by mouth as needed for erectile dysfunction. 12/20/12   Biagio Borg, MD  solifenacin (VESICARE) 5 MG tablet Take 1 tablet (5 mg total) by mouth daily. Patient taking differently: Take 5 mg by mouth daily as needed (bladder). 07/05/20   Biagio Borg, MD  tadalafil (CIALIS) 20 MG tablet Take 20 mg by mouth as needed. 02/20/22   [provider]  terazosin (HYTRIN) 5 MG capsule Take 5 mg by mouth at bedtime.  03/22/15   [provider]  Testosterone 20.25 MG/1.25GM (1.62%) GEL Apply 2  Packages topically daily. 08/05/20   [provider]  thiamine 250 MG tablet Take 250 mg by mouth daily.    [provider]  traZODone (DESYREL) 100 MG tablet Take 1 tablet (100 mg total) by mouth at bedtime as needed for sleep. 02/06/22   Biagio Borg, MD  triamcinolone cream (KENALOG) 0.5 % Apply 1 application topically 3 (three) times daily. 12/07/19   Biagio Borg, MD  Turmeric 500 MG TABS Take 500 mg by mouth daily.    [provider]      Allergies    Cefuroxime axetil    Review of Systems   Review of Systems  Respiratory:  Positive for cough.  Physical Exam Updated Vital Signs BP (!) 140/71   Pulse 93   Temp 98.4 F (36.9 C)   Resp (!) 22   SpO2 97%  Physical Exam Vitals and nursing note reviewed.  Constitutional:      General: He is not in acute distress.    Appearance: He is well-developed.  HENT:     Head: Normocephalic and atraumatic.  Eyes:     Conjunctiva/sclera: Conjunctivae normal.     Pupils: Pupils are equal, round, and reactive to light.  Cardiovascular:     Rate and Rhythm: Normal rate and regular rhythm.     Heart sounds: No murmur heard. Pulmonary:     Effort: Pulmonary effort is normal. No respiratory distress.     Breath sounds: Normal breath sounds. No wheezing or rales.  Abdominal:     General: There is no distension.     Palpations: Abdomen is soft.     Tenderness: There is no abdominal tenderness. There is no guarding or rebound.  Musculoskeletal:        General: No tenderness. Normal range of motion.     Cervical back: Normal range of motion and neck supple.     Right lower leg: Edema present.     Left lower leg: Edema present.     Comments: 1+ edema present in bilateral legs  Skin:    General: Skin is warm and dry.     Findings: No erythema or rash.  Neurological:     Mental Status: He is alert and oriented to person, place, and time.  Psychiatric:        Behavior: Behavior normal.     ED Results /  Procedures / Treatments   Labs (all labs ordered are listed, but only abnormal results are displayed) Labs Reviewed  RESP PANEL BY RT-PCR (RSV, FLU A&B, COVID)  RVPGX2 - Abnormal; Notable for the following components:      Result Value   Influenza B by PCR POSITIVE (*)    All other components within normal limits  CBC - Abnormal; Notable for the following components:   Platelets 123 (*)    All other components within normal limits  BASIC METABOLIC PANEL - Abnormal; Notable for the following components:   Glucose, Bld 112 (*)    Calcium 8.4 (*)    All other components within normal limits  BRAIN NATRIURETIC PEPTIDE - Abnormal; Notable for the following components:   B Natriuretic Peptide 160.4 (*)    All other components within normal limits  TROPONIN I (HIGH SENSITIVITY)    EKG None  Radiology DG Chest Port 1 View  Result Date: 06/22/2022 CLINICAL DATA:  Cough EXAM: PORTABLE CHEST 1 VIEW COMPARISON:  02/14/2020 FINDINGS: Heart size and mediastinal contours are unremarkable. Stable chronic reticular interstitial opacities within the left upper lobe compatible with postinflammatory change. No pleural effusion, interstitial edema or airspace consolidation. Degenerative changes are noted within both AC joints. Thoracic spondylosis. IMPRESSION: 1. No acute cardiopulmonary abnormalities. 2. Chronic postinflammatory changes within the left upper lobe. Electronically Signed   By: Kerby Moors M.D.   On: 06/22/2022 14:27    Procedures Procedures    Medications Ordered in ED Medications  predniSONE (DELTASONE) tablet 60 mg (has no administration in time range)    ED Course/ Medical Decision Making/ A&P                           Medical Decision Making Amount and/or Complexity  of Data Reviewed External Data Reviewed: notes.    Details: Urgent care Labs: ordered. Decision-making details documented in ED Course. Radiology: ordered and independent interpretation performed.  Decision-making details documented in ED Course.  Risk Prescription drug management.   Pt with multiple medical problems and comorbidities and presenting today with a complaint that caries a high risk for morbidity and mortality.  Here today with persistent cough despite having a course of azithromycin, prednisone and now using an inhaler.  He completed the course of prednisone over a week ago.  He has not had fever but continues to have sinus congestion and coughing up stuff.  Concern for possible pneumonia versus bronchitis post viral illness versus CHF.  Patient does have swelling in his lower extremities and does not take a diuretic regularly.  He is mildly tachypneic here but sats are 97% on room air.  He is in no acute distress.  He can speak in full sentences.  No significant wheezing noted on exam. I have independently visualized and interpreted pt's images today.  CXR without acute findings today.  I independently interpreted patient's labs patient is positive for influenza B today, BNP is only slightly above normal at 160, CBC and BMP without acute findings.  At this time feel that patient is having bronchitis after his recent flu.  Findings discussed with the patient.  Will start another course of prednisone.  He has plenty of albuterol at home.  Discussed with him return precautions and follow-up with his PCP.  At this time no indication for further testing.  Patient is stable for discharge home.          Final Clinical Impression(s) / ED Diagnoses Final diagnoses:  Influenzal bronchitis    Rx / DC Orders ED Discharge Orders          Ordered    predniSONE (DELTASONE) 20 MG tablet  Daily        06/22/22 1606    benzonatate (TESSALON) 100 MG capsule  Every 8 hours        06/22/22 1606              Blanchie Dessert, MD 06/22/22 1609

## 2022-06-22 NOTE — Telephone Encounter (Signed)
Script sent to different Pharmacy.

## 2022-06-22 NOTE — ED Provider Triage Note (Signed)
Emergency Medicine Provider Triage Evaluation Note  Warren Weeks , a 77 y.o. male  was evaluated in triage.  Pt complains of cough. States that same has been ongoing for the past 2 weeks without improvement. Has been to urgent care and given z pack and prednisone without improvement. States he is coughing up yellow and gray sputum. Denies fevers or chills. No chest pain or shortness of breath. He does not smoke  Review of Systems  Positive:  Negative:   Physical Exam  BP (!) 140/71   Pulse 93   Temp 98.4 F (36.9 C)   Resp (!) 22   SpO2 97%  Gen:   Awake, no distress   Resp:  Normal effort  MSK:   Moves extremities without difficulty  Other:    Medical Decision Making  Medically screening exam initiated at 1:55 PM.  Appropriate orders placed.  Honest L Gowell was informed that the remainder of the evaluation will be completed by another provider, this initial triage assessment does not replace that evaluation, and the importance of remaining in the ED until their evaluation is complete.     Bud Face, PA-C 06/22/22 1357

## 2022-06-22 NOTE — Discharge Instructions (Signed)
You have done developed bronchitis after having the flu.  Continue to use your albuterol inhaler 4 times a day.  We are going to start another round of prednisone will hopefully help with the inflammation you are having in your lungs.  Follow-up with your doctor if this does not seem to help.  No signs of pneumonia or congestive heart failure today.

## 2022-06-22 NOTE — ED Triage Notes (Signed)
Pt here with reports of coughing up "infectious stuff" for over two weeks. Seen at [facility] and prescribed azithromycin and prednisone which completed a week ago. Pt with ongoing symptoms.

## 2022-06-25 ENCOUNTER — Telehealth: Payer: Self-pay | Admitting: *Deleted

## 2022-06-25 NOTE — Telephone Encounter (Signed)
     Patient  visit on 06/22/2022  at [facility]  was for cough  Have you been able to follow up with your primary care physician? Patient is still feeling off but reaching out today to PCP has medicine and transportation    Are there diet recommendations that you are having difficulty following?  Patient expresses understanding of discharge instructions and education provided has no other needs at this time.   [place] [phone] [address] , [place] [address] Email : [email or web address]

## 2022-06-29 DIAGNOSIS — G4733 Obstructive sleep apnea (adult) (pediatric): Secondary | ICD-10-CM | POA: Diagnosis not present

## 2022-07-03 ENCOUNTER — Ambulatory Visit: Payer: Medicare PPO | Admitting: Internal Medicine

## 2022-07-03 ENCOUNTER — Encounter: Payer: Self-pay | Admitting: Internal Medicine

## 2022-07-03 VITALS — BP 132/66 | HR 74 | Temp 98.0°F | Ht 69.0 in | Wt 247.0 lb

## 2022-07-03 DIAGNOSIS — B370 Candidal stomatitis: Secondary | ICD-10-CM

## 2022-07-03 DIAGNOSIS — R051 Acute cough: Secondary | ICD-10-CM | POA: Diagnosis not present

## 2022-07-03 DIAGNOSIS — J449 Chronic obstructive pulmonary disease, unspecified: Secondary | ICD-10-CM

## 2022-07-03 MED ORDER — BENZONATATE 100 MG PO CAPS: 100.0000 mg | ORAL_CAPSULE | Freq: Three times a day (TID) | ORAL | 0 refills | Status: DC

## 2022-07-03 MED ORDER — NYSTATIN 100000 UNIT/ML MT SUSP: 5.0000 mL | Freq: Four times a day (QID) | OROMUCOSAL | 1 refills | Status: DC

## 2022-07-03 MED ORDER — METOPROLOL TARTRATE 50 MG PO TABS: 50.0000 mg | ORAL_TABLET | Freq: Two times a day (BID) | ORAL | 1 refills | Status: DC

## 2022-07-03 MED ORDER — LISINOPRIL 20 MG PO TABS: 20.0000 mg | ORAL_TABLET | Freq: Every day | ORAL | 3 refills | Status: DC

## 2022-07-03 MED ORDER — DOXYCYCLINE HYCLATE 100 MG PO TABS: 100.0000 mg | ORAL_TABLET | Freq: Two times a day (BID) | ORAL | 0 refills | Status: DC

## 2022-07-03 NOTE — Progress Notes (Signed)
Patient ID: Warren Weeks, male   DOB: 05/01/1946, 77 y.o.   MRN: [id number]        Chief Complaint: follow up recent cough and wheezing worse again, tongue rash thrush, copd       HPI:  Warren Weeks is a 77 y.o. male. Here with acute onset mild to mod 2-3 days ST, HA, general weakness and malaise, with prod cough greenish sputum and wheeziness in the left chest with sob better with inhaler use, but Pt denies chest pain, orthopnea, PND, increased LE swelling, palpitations, dizziness or syncope.  Was recently seen at [facility] late dec 2023 tx with zpack and steroid several days, later seen with Flu + at ED jan 1.  Overall seemed improved but now worsening again.  Inhaler helps some.  Has some ? Inflammatory changes on recent cxr, pt plans to f/u with pulmonary.  Inhaler helps the wheezing. Has plan for f/u right cataract with optho soon, now s/p left cataract late dec just prior to most recent illness.        Wt Readings from Last 3 Encounters:  07/03/22 247 lb (112 kg)  04/27/22 256 lb 3.2 oz (116.2 kg)  03/05/22 253 lb (114.8 kg)   BP Readings from Last 3 Encounters:  07/03/22 132/66  06/22/22 (!) 140/71  06/05/22 (!) 162/91         Past Medical History:  Diagnosis Date   ALLERGIC RHINITIS 09/16/2007   ALLERGY 03/24/2007   Allergy    ANXIETY 03/28/2007   Arthritis    knees, back,shoulder   CAROTID ARTERY STENOSIS, BILATERAL 07/01/2009   yearly checks - no current problems   CEPHALGIA 06/08/2009   CHEST PAIN 09/16/2007   no current problems   COLONIC POLYPS, HX OF 09/16/2007   COPD (chronic obstructive pulmonary disease) ([place]) 05/12/2017   mild per patient - no inhaler   Dizziness and giddiness 06/08/2009   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   DYSPNEA/SHORTNESS OF BREATH 09/16/2007   ERECTILE DYSFUNCTION 03/28/2007   ESOPHAGEAL STRICTURE 09/16/2007   ESOPHAGITIS 03/24/2007   GERD 03/24/2007   GLUCOSE INTOLERANCE 09/16/2007   HEMORRHOIDS, INTERNAL 03/24/2007   HYPERCHOLESTEROLEMIA 03/24/2007    HYPERLIPIDEMIA 09/16/2007   HYPERTENSION 03/28/2007   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/2011   Impaired glucose tolerance 10/04/2010   LOW BACK PAIN 03/28/2007   MAGNETIC RESONANCE IMAGING, BRAIN, ABNORMAL 06/26/2009   NECK PAIN, LEFT 06/20/2010   OBESITY 03/24/2007   OTITIS MEDIA, LEFT 06/10/2009   Palpitations 06/20/2010   no current problems   PVD 06/25/2009   RASH-NONVESICULAR 06/17/2009   Sleep apnea 12/25/2021   URI 08/16/2009   Past Surgical History:  Procedure Laterality Date   COLONOSCOPY  03/2013   polyps/[place]   LEFT HEART CATH AND CORONARY ANGIOGRAPHY N/A 10/14/2021   Procedure: LEFT HEART CATH AND CORONARY ANGIOGRAPHY;  Surgeon: Jettie Booze, MD;  Location: [facility];  Service: Cardiovascular;  Laterality: N/A;   NASAL SEPTOPLASTY W/ TURBINOPLASTY Bilateral 01/03/2021   Procedure: NASAL SEPTOPLASTY WITH BILATERAL TURBINATE REDUCTION;  Surgeon: Leta Baptist, MD;  Location: [facility];  Service: ENT;  Laterality: Bilateral;   s/p right knee arthroscopy     UPPER GASTROINTESTINAL ENDOSCOPY     gerd    reports that he quit smoking about 31 years ago. His smoking use included cigarettes. He has a 7.50 pack-year smoking history. He has never used smokeless tobacco. He reports current alcohol use of about 7.0 standard drinks of alcohol per week. He reports that he  does not use drugs. family history includes Coronary artery disease in his brother and sister; Heart disease in his father; Lung cancer in his sister; Lymphoma in his mother; Ovarian cancer in his sister. Allergies  Allergen Reactions   Cefuroxime Axetil Rash   Current Outpatient Medications on File Prior to Visit  Medication Sig Dispense Refill   acetaminophen (TYLENOL) 650 MG CR tablet Take 650 mg by mouth every 8 (eight) hours as needed for pain.     albuterol (VENTOLIN HFA) 108 (90 Base) MCG/ACT inhaler Inhale 2 puffs into the lungs every 6 (six) hours as needed for wheezing or shortness of breath. 8 g 0    albuterol (VENTOLIN HFA) 108 (90 Base) MCG/ACT inhaler Inhale 1-2 puffs into the lungs every 6 (six) hours as needed for wheezing or shortness of breath. 8 g 0   aspirin 81 MG EC tablet Take 1 tablet (81 mg total) by mouth daily. Swallow whole. 30 tablet 12   azithromycin (ZITHROMAX Z-PAK) 250 MG tablet 2 tablets initially, then 1 tablet daily until completed. 6 each 0   Cyanocobalamin (B-12 PO) Take 1 tablet by mouth daily.     diclofenac Sodium (VOLTAREN) 1 % GEL Apply 2 g topically daily as needed (knee pain).     Diclofenac Sodium CR 100 MG 24 hr tablet Take 1 tablet (100 mg total) by mouth daily. 90 tablet 1   ezetimibe (ZETIA) 10 MG tablet Take 1 tablet by mouth once daily 90 tablet 3   fluticasone (FLONASE) 50 MCG/ACT nasal spray Use 2 spray(s) in each nostril once daily 16 g 0   fluticasone-salmeterol (ADVAIR) 250-50 MCG/ACT AEPB INHALE 1 DOSE BY MOUTH ONCE DAILY IN THE MORNING AND THEN 1 PUFF BY MOUTH IN THE EVENING 60 each 0   hydrochlorothiazide (HYDRODIURIL) 25 MG tablet Take 1 tablet by mouth once daily 90 tablet 3   HYDROcodone bit-homatropine (HYCODAN) 5-1.5 MG/5ML syrup Take 5 mLs by mouth every 6 (six) hours as needed for cough. 120 mL 0   HYDROcodone bit-homatropine (HYCODAN) 5-1.5 MG/5ML syrup Take 5 mLs by mouth every 6 (six) hours as needed for cough. 120 mL 0   Melatonin 10 MG TABS Take 10 mg by mouth at bedtime.      methocarbamol (ROBAXIN) 500 MG tablet Take 1 tablet (500 mg total) by mouth 2 (two) times daily as needed. 60 tablet 2   mometasone (NASONEX) 50 MCG/ACT nasal spray Place 2 sprays into the nose daily. 1 each 12   Multiple Vitamin (ONE-A-DAY 55 PLUS PO) Take 1 tablet by mouth daily.     ofloxacin (OCUFLOX) 0.3 % ophthalmic solution Instill 1 drop TID in operative eye(s) starting 2 days prior to surgery and after surgery for 3 weeks.     Omega-3 Fatty Acids (FISH OIL) 1000 MG CAPS Take 1,000 mg by mouth daily.     omeprazole (PRILOSEC) 20 MG capsule Take 1 capsule  by mouth twice daily 180 capsule 3   predniSONE (DELTASONE) 20 MG tablet Take 2 tablets (40 mg total) by mouth daily. 10 tablet 0   rosuvastatin (CRESTOR) 40 MG tablet Take 1 tablet by mouth once daily 90 tablet 3   sildenafil (VIAGRA) 100 MG tablet Take 1 tablet (100 mg total) by mouth as needed for erectile dysfunction. 10 tablet 11   solifenacin (VESICARE) 5 MG tablet Take 1 tablet (5 mg total) by mouth daily. (Patient taking differently: Take 5 mg by mouth daily as needed (bladder).) 90 tablet 3  tadalafil (CIALIS) 20 MG tablet Take 20 mg by mouth as needed.     terazosin (HYTRIN) 5 MG capsule Take 5 mg by mouth at bedtime.      Testosterone 20.25 MG/1.25GM (1.62%) GEL Apply 2 Packages topically daily.     thiamine 250 MG tablet Take 250 mg by mouth daily.     traZODone (DESYREL) 100 MG tablet Take 1 tablet (100 mg total) by mouth at bedtime as needed for sleep. 90 tablet 1   triamcinolone cream (KENALOG) 0.5 % Apply 1 application topically 3 (three) times daily. 30 g 1   Turmeric 500 MG TABS Take 500 mg by mouth daily.     ketorolac (ACULAR) 0.5 % ophthalmic solution One drop in operative eye(s) TID starting 2 days before surgery and continuing 3 weeks after surgery. (Patient not taking: Reported on 04/27/2022)     prednisoLONE acetate (PRED FORTE) 1 % ophthalmic suspension One drop in operative eye(s) TID starting 2 days before surgery and continuing after surgery for 3 weeks (Patient not taking: Reported on 04/27/2022)     No current facility-administered medications on file prior to visit.        ROS:  All others reviewed and negative.  Objective        PE:  BP 132/66 (BP Location: Right Arm, Patient Position: Sitting, Cuff Size: Large)   Pulse 74   Temp 98 F (36.7 C) (Oral)   Ht '5\' 9"'$  (1.753 m)   Wt 247 lb (112 kg)   SpO2 96%   BMI 36.48 kg/m                 Constitutional: Pt appears in NAD               HENT: Head: NCAT.                Right Ear: External ear normal.                  Left Ear: External ear normal.                Eyes: . Pupils are equal, round, and reactive to light. Conjunctivae and EOM are normal               Nose: without d/c or deformity               Neck: Neck supple. Gross normal ROM               Cardiovascular: Normal rate and regular rhythm.                 Pulmonary/Chest: Effort normal and breath sounds without rales or wheezing. But with course BS left mid lung               Abd:  Soft, NT, ND, + BS, no organomegaly               Neurological: Pt is alert. At baseline orientation, motor grossly intact               Skin: Skin is warm. No rashes, no other new lesions, LE edema - trace bilateral               Psychiatric: Pt behavior is normal without agitation   Micro: none  Cardiac tracings I have personally interpreted today:  none  Pertinent Radiological findings (summarize): none   Lab Results  Component Value Date   WBC 7.0 06/22/2022  HGB 14.1 06/22/2022   HCT 42.9 06/22/2022   PLT 123 (L) 06/22/2022   GLUCOSE 112 (H) 06/22/2022   CHOL 160 02/06/2022   TRIG 65.0 02/06/2022   HDL 70.30 02/06/2022   LDLDIRECT 176.0 04/03/2011   LDLCALC 77 02/06/2022   ALT 36 02/06/2022   AST 31 02/06/2022   NA 141 06/22/2022   K 3.5 06/22/2022   CL 107 06/22/2022   CREATININE 1.01 06/22/2022   BUN 10 06/22/2022   CO2 24 06/22/2022   TSH 1.55 09/17/2021   PSA 0.10 09/17/2021   HGBA1C 6.6 (H) 02/06/2022   MICROALBUR <0.7 09/14/2019   Assessment/Plan:  Warren Weeks is a 77 y.o. Black or African American [2] male with  has a past medical history of ALLERGIC RHINITIS (09/16/2007), ALLERGY (03/24/2007), Allergy, ANXIETY (03/28/2007), Arthritis, CAROTID ARTERY STENOSIS, BILATERAL (07/01/2009), CEPHALGIA (06/08/2009), CHEST PAIN (09/16/2007), COLONIC POLYPS, HX OF (09/16/2007), COPD (chronic obstructive pulmonary disease) ([place]) (05/12/2017), Dizziness and giddiness (06/08/2009), Dyshidrotic eczema (06/03/2018), DYSPNEA/SHORTNESS OF  BREATH (09/16/2007), ERECTILE DYSFUNCTION (03/28/2007), ESOPHAGEAL STRICTURE (09/16/2007), ESOPHAGITIS (03/24/2007), GERD (03/24/2007), GLUCOSE INTOLERANCE (09/16/2007), HEMORRHOIDS, INTERNAL (03/24/2007), HYPERCHOLESTEROLEMIA (03/24/2007), HYPERLIPIDEMIA (09/16/2007), HYPERTENSION (03/28/2007), Hypogonadism male (10/09/2011), Impaired glucose tolerance (10/04/2010), LOW BACK PAIN (03/28/2007), MAGNETIC RESONANCE IMAGING, BRAIN, ABNORMAL (06/26/2009), NECK PAIN, LEFT (06/20/2010), OBESITY (03/24/2007), OTITIS MEDIA, LEFT (06/10/2009), Palpitations (06/20/2010), PVD (06/25/2009), RASH-NONVESICULAR (06/17/2009), Sleep apnea (12/25/2021), and URI (08/16/2009).  COPD (chronic obstructive pulmonary disease) ([facility]) O/w stable, no need further steroid, f/u with pulmonary as planned  Cough With decresaed BS left mid lung, 1/1 cxr with noted chronic post inflammatory changes, but now with some mild worsening, can't r/o underlying pna - for antibx, tessalon perle prn, f/u pulm as planned  Thrush Mild to mod, for nystatin soln asd, to f/u any worsening symptoms or concerns  Followup: Return if symptoms worsen or fail to improve.  Cathlean Cower, MD 07/03/2022 1:01 PM [facility]

## 2022-07-03 NOTE — Assessment & Plan Note (Signed)
O/w stable, no need further steroid, f/u with pulmonary as planned

## 2022-07-03 NOTE — Patient Instructions (Addendum)
Please take all new medication as prescribed - the antibiotic and cough perles as needed, and the Nystatin solution for the thrush  Please continue all other medications as before, and refills have been done if requested - the blood pressure medications  Please have the pharmacy call with any other refills you may need.  Please continue your efforts at being more active, low cholesterol diet, and weight control.  Please keep your appointments with your specialists as you may have planned  Please follow up with Pulmonary as you mentioned  We will see you back in April 2024 as planned,

## 2022-07-03 NOTE — Assessment & Plan Note (Signed)
Mild to mod, for nystatin soln asd,  to f/u any worsening symptoms or concerns 

## 2022-07-03 NOTE — Assessment & Plan Note (Addendum)
With decresaed BS left mid lung, 1/1 cxr with noted chronic post inflammatory changes, but now with some mild worsening, can't r/o underlying pna - for antibx, tessalon perle prn, f/u pulm as planned

## 2022-07-20 ENCOUNTER — Other Ambulatory Visit: Payer: Self-pay | Admitting: Pulmonary Disease

## 2022-07-22 ENCOUNTER — Telehealth: Payer: Self-pay | Admitting: Pulmonary Disease

## 2022-07-22 DIAGNOSIS — I10 Essential (primary) hypertension: Secondary | ICD-10-CM | POA: Diagnosis not present

## 2022-07-22 DIAGNOSIS — E785 Hyperlipidemia, unspecified: Secondary | ICD-10-CM | POA: Diagnosis not present

## 2022-07-22 DIAGNOSIS — M1990 Unspecified osteoarthritis, unspecified site: Secondary | ICD-10-CM | POA: Diagnosis not present

## 2022-07-22 DIAGNOSIS — R32 Unspecified urinary incontinence: Secondary | ICD-10-CM | POA: Diagnosis not present

## 2022-07-22 DIAGNOSIS — N529 Male erectile dysfunction, unspecified: Secondary | ICD-10-CM | POA: Diagnosis not present

## 2022-07-22 DIAGNOSIS — F411 Generalized anxiety disorder: Secondary | ICD-10-CM | POA: Diagnosis not present

## 2022-07-22 DIAGNOSIS — K219 Gastro-esophageal reflux disease without esophagitis: Secondary | ICD-10-CM | POA: Diagnosis not present

## 2022-07-22 DIAGNOSIS — R600 Localized edema: Secondary | ICD-10-CM | POA: Diagnosis not present

## 2022-07-22 NOTE — Telephone Encounter (Signed)
  PT is out of inhaler. Pls call to advise. TY.    [facility] on [address] call PT @ [phone]

## 2022-07-23 NOTE — Telephone Encounter (Signed)
Looks like inhaler was already sent in. Paige sent in inhaler yesterday afternoon to pharmacy. Nothing further needed

## 2022-07-30 DIAGNOSIS — G4733 Obstructive sleep apnea (adult) (pediatric): Secondary | ICD-10-CM | POA: Diagnosis not present

## 2022-08-05 ENCOUNTER — Ambulatory Visit: Payer: Medicare PPO | Admitting: Pulmonary Disease

## 2022-08-06 DIAGNOSIS — E291 Testicular hypofunction: Secondary | ICD-10-CM | POA: Diagnosis not present

## 2022-08-06 DIAGNOSIS — R948 Abnormal results of function studies of other organs and systems: Secondary | ICD-10-CM | POA: Diagnosis not present

## 2022-08-12 DIAGNOSIS — N401 Enlarged prostate with lower urinary tract symptoms: Secondary | ICD-10-CM | POA: Diagnosis not present

## 2022-08-12 DIAGNOSIS — R3912 Poor urinary stream: Secondary | ICD-10-CM | POA: Diagnosis not present

## 2022-08-12 DIAGNOSIS — N5201 Erectile dysfunction due to arterial insufficiency: Secondary | ICD-10-CM | POA: Diagnosis not present

## 2022-08-12 DIAGNOSIS — E291 Testicular hypofunction: Secondary | ICD-10-CM | POA: Diagnosis not present

## 2022-08-14 ENCOUNTER — Ambulatory Visit (INDEPENDENT_AMBULATORY_CARE_PROVIDER_SITE_OTHER): Payer: Medicare PPO

## 2022-08-14 VITALS — Ht 69.0 in | Wt 247.0 lb

## 2022-08-14 DIAGNOSIS — Z0000 Encounter for general adult medical examination without abnormal findings: Secondary | ICD-10-CM

## 2022-08-14 NOTE — Patient Instructions (Signed)
Warren Weeks , Thank you for taking time to come for your Medicare Wellness Visit. I appreciate your ongoing commitment to your health goals. Please review the following plan we discussed and let me know if I can assist you in the future.   These are the goals we discussed:  Goals      DIET - INCREASE WATER INTAKE        This is a list of the screening recommended for you and due dates:  Health Maintenance  Topic Date Due   COVID-19 Vaccine (4 - 2023-24 season) 02/20/2022   Colon Cancer Screening  03/05/2023   Medicare Annual Wellness Visit  08/15/2023   DTaP/Tdap/Td vaccine (3 - Td or Tdap) 12/06/2028   Pneumonia Vaccine  Completed   Flu Shot  Completed   Hepatitis C Screening: USPSTF Recommendation to screen - Ages 18-79 yo.  Completed   Zoster (Shingles) Vaccine  Completed   HPV Vaccine  Aged Out    Advanced directives: No; Advance directive discussed with you today. Even though you declined this today please call our office should you change your mind and we can give you the proper paperwork for you to fill out.  Conditions/risks identified: Yes  Next appointment: Follow up in one year for your annual wellness visit.   Preventive Care 40 Years and Older, Male  Preventive care refers to lifestyle choices and visits with your health care provider that can promote health and wellness. What does preventive care include? A yearly physical exam. This is also called an annual well check. Dental exams once or twice a year. Routine eye exams. Ask your health care provider how often you should have your eyes checked. Personal lifestyle choices, including: Daily care of your teeth and gums. Regular physical activity. Eating a healthy diet. Avoiding tobacco and drug use. Limiting alcohol use. Practicing safe sex. Taking low doses of aspirin every day. Taking vitamin and mineral supplements as recommended by your health care provider. What happens during an annual well check? The  services and screenings done by your health care provider during your annual well check will depend on your age, overall health, lifestyle risk factors, and family history of disease. Counseling  Your health care provider may ask you questions about your: Alcohol use. Tobacco use. Drug use. Emotional well-being. Home and relationship well-being. Sexual activity. Eating habits. History of falls. Memory and ability to understand (cognition). Work and work Statistician. Screening  You may have the following tests or measurements: Height, weight, and BMI. Blood pressure. Lipid and cholesterol levels. These may be checked every 5 years, or more frequently if you are over 68 years old. Skin check. Lung cancer screening. You may have this screening every year starting at age 54 if you have a 30-pack-year history of smoking and currently smoke or have quit within the past 15 years. Fecal occult blood test (FOBT) of the stool. You may have this test every year starting at age 51. Flexible sigmoidoscopy or colonoscopy. You may have a sigmoidoscopy every 5 years or a colonoscopy every 10 years starting at age 2. Prostate cancer screening. Recommendations will vary depending on your family history and other risks. Hepatitis C blood test. Hepatitis B blood test. Sexually transmitted disease (STD) testing. Diabetes screening. This is done by checking your blood sugar (glucose) after you have not eaten for a while (fasting). You may have this done every 1-3 years. Abdominal aortic aneurysm (AAA) screening. You may need this if you are a current  or former smoker. Osteoporosis. You may be screened starting at age 15 if you are at high risk. Talk with your health care provider about your test results, treatment options, and if necessary, the need for more tests. Vaccines  Your health care provider may recommend certain vaccines, such as: Influenza vaccine. This is recommended every year. Tetanus,  diphtheria, and acellular pertussis (Tdap, Td) vaccine. You may need a Td booster every 10 years. Zoster vaccine. You may need this after age 36. Pneumococcal 13-valent conjugate (PCV13) vaccine. One dose is recommended after age 28. Pneumococcal polysaccharide (PPSV23) vaccine. One dose is recommended after age 60. Talk to your health care provider about which screenings and vaccines you need and how often you need them. This information is not intended to replace advice given to you by your health care provider. Make sure you discuss any questions you have with your health care provider. Document Released: 07/05/2015 Document Revised: 02/26/2016 Document Reviewed: 04/09/2015 Elsevier Interactive Patient Education  2017 [place] Prevention in the Home Falls can cause injuries. They can happen to people of all ages. There are many things you can do to make your home safe and to help prevent falls. What can I do on the outside of my home? Regularly fix the edges of walkways and driveways and fix any cracks. Remove anything that might make you trip as you walk through a door, such as a raised step or threshold. Trim any bushes or trees on the path to your home. Use bright outdoor lighting. Clear any walking paths of anything that might make someone trip, such as rocks or tools. Regularly check to see if handrails are loose or broken. Make sure that both sides of any steps have handrails. Any raised decks and porches should have guardrails on the edges. Have any leaves, snow, or ice cleared regularly. Use sand or salt on walking paths during winter. Clean up any spills in your garage right away. This includes oil or grease spills. What can I do in the bathroom? Use night lights. Install grab bars by the toilet and in the tub and shower. Do not use towel bars as grab bars. Use non-skid mats or decals in the tub or shower. If you need to sit down in the shower, use a plastic,  non-slip stool. Keep the floor dry. Clean up any water that spills on the floor as soon as it happens. Remove soap buildup in the tub or shower regularly. Attach bath mats securely with double-sided non-slip rug tape. Do not have throw rugs and other things on the floor that can make you trip. What can I do in the bedroom? Use night lights. Make sure that you have a light by your bed that is easy to reach. Do not use any sheets or blankets that are too big for your bed. They should not hang down onto the floor. Have a firm chair that has side arms. You can use this for support while you get dressed. Do not have throw rugs and other things on the floor that can make you trip. What can I do in the kitchen? Clean up any spills right away. Avoid walking on wet floors. Keep items that you use a lot in easy-to-reach places. If you need to reach something above you, use a strong step stool that has a grab bar. Keep electrical cords out of the way. Do not use floor polish or wax that makes floors slippery. If you must use wax,  use non-skid floor wax. Do not have throw rugs and other things on the floor that can make you trip. What can I do with my stairs? Do not leave any items on the stairs. Make sure that there are handrails on both sides of the stairs and use them. Fix handrails that are broken or loose. Make sure that handrails are as long as the stairways. Check any carpeting to make sure that it is firmly attached to the stairs. Fix any carpet that is loose or worn. Avoid having throw rugs at the top or bottom of the stairs. If you do have throw rugs, attach them to the floor with carpet tape. Make sure that you have a light switch at the top of the stairs and the bottom of the stairs. If you do not have them, ask someone to add them for you. What else can I do to help prevent falls? Wear shoes that: Do not have high heels. Have rubber bottoms. Are comfortable and fit you well. Are closed  at the toe. Do not wear sandals. If you use a stepladder: Make sure that it is fully opened. Do not climb a closed stepladder. Make sure that both sides of the stepladder are locked into place. Ask someone to hold it for you, if possible. Clearly mark and make sure that you can see: Any grab bars or handrails. First and last steps. Where the edge of each step is. Use tools that help you move around (mobility aids) if they are needed. These include: Canes. Walkers. Scooters. Crutches. Turn on the lights when you go into a dark area. Replace any light bulbs as soon as they burn out. Set up your furniture so you have a clear path. Avoid moving your furniture around. If any of your floors are uneven, fix them. If there are any pets around you, be aware of where they are. Review your medicines with your doctor. Some medicines can make you feel dizzy. This can increase your chance of falling. Ask your doctor what other things that you can do to help prevent falls. This information is not intended to replace advice given to you by your health care provider. Make sure you discuss any questions you have with your health care provider. Document Released: 04/04/2009 Document Revised: 11/14/2015 Document Reviewed: 07/13/2014 Elsevier Interactive Patient Education  2017 [employer].

## 2022-08-14 NOTE — Progress Notes (Signed)
I connected with  Warren Weeks on 08/14/2022 at 10:45 a.m. EST by telephone and verified that I am speaking with the correct person using two identifiers.  Location: Patient: Home Provider: Stuart Persons participating in the virtual visit: [place]   I discussed the limitations, risks, security and privacy concerns of performing an evaluation and management service by telephone and the availability of in person appointments. The patient expressed understanding and agreed to proceed.  Interactive audio and video telecommunications were attempted between this nurse and patient, however failed, due to patient having technical difficulties OR patient did not have access to video capability.  We continued and completed visit with audio only.  Some vital signs may be absent or patient reported.   Sheral Flow, LPN  Subjective:   Warren Weeks is a 77 y.o. male who presents for Medicare Annual/Subsequent preventive examination.  Review of Systems     Cardiac Risk Factors include: advanced age (>53mn, >>51women);dyslipidemia;family history of premature cardiovascular disease;hypertension;male gender;obesity (BMI >30kg/m2);sedentary lifestyle     Objective:    Today's Vitals   08/14/22 1047  Weight: 247 lb (112 kg)  Height: '5\' 9"'$  (1.753 m)  PainSc: 0-No pain   Body mass index is 36.48 kg/m.     08/14/2022   10:48 AM 10/14/2021    6:15 AM 08/13/2021   10:56 AM 01/03/2021    8:28 AM 12/26/2020    4:23 PM 10/10/2020    2:53 PM 08/01/2020   10:00 AM  Advanced Directives  Does Patient Have a Medical Advance Directive? No No No No No No No  Would patient like information on creating a medical advance directive? No - Patient declined No - Patient declined No - Patient declined No - Patient declined No - Patient declined No - Patient declined No - Patient declined    Current Medications (verified) Outpatient Encounter Medications as of 08/14/2022   Medication Sig   acetaminophen (TYLENOL) 650 MG CR tablet Take 650 mg by mouth every 8 (eight) hours as needed for pain.   albuterol (VENTOLIN HFA) 108 (90 Base) MCG/ACT inhaler Inhale 2 puffs into the lungs every 6 (six) hours as needed for wheezing or shortness of breath.   albuterol (VENTOLIN HFA) 108 (90 Base) MCG/ACT inhaler Inhale 1-2 puffs into the lungs every 6 (six) hours as needed for wheezing or shortness of breath.   aspirin 81 MG EC tablet Take 1 tablet (81 mg total) by mouth daily. Swallow whole.   azithromycin (ZITHROMAX Z-PAK) 250 MG tablet 2 tablets initially, then 1 tablet daily until completed.   benzonatate (TESSALON) 100 MG capsule Take 1 capsule (100 mg total) by mouth every 8 (eight) hours.   Cyanocobalamin (B-12 PO) Take 1 tablet by mouth daily.   diclofenac Sodium (VOLTAREN) 1 % GEL Apply 2 g topically daily as needed (knee pain).   Diclofenac Sodium CR 100 MG 24 hr tablet Take 1 tablet (100 mg total) by mouth daily.   doxycycline (VIBRA-TABS) 100 MG tablet Take 1 tablet (100 mg total) by mouth 2 (two) times daily.   ezetimibe (ZETIA) 10 MG tablet Take 1 tablet by mouth once daily   fluticasone (FLONASE) 50 MCG/ACT nasal spray Use 2 spray(s) in each nostril once daily   fluticasone-salmeterol (ADVAIR) 250-50 MCG/ACT AEPB INHALE 1 DOSE BY MOUTH ONCE DAILY IN THE MORNING AND 1 ONCE DAILY IN THE EVENING   hydrochlorothiazide (HYDRODIURIL) 25 MG tablet Take 1 tablet by mouth once daily   HYDROcodone  bit-homatropine (HYCODAN) 5-1.5 MG/5ML syrup Take 5 mLs by mouth every 6 (six) hours as needed for cough.   HYDROcodone bit-homatropine (HYCODAN) 5-1.5 MG/5ML syrup Take 5 mLs by mouth every 6 (six) hours as needed for cough.   ketorolac (ACULAR) 0.5 % ophthalmic solution One drop in operative eye(s) TID starting 2 days before surgery and continuing 3 weeks after surgery. (Patient not taking: Reported on 04/27/2022)   lisinopril (ZESTRIL) 20 MG tablet Take 1 tablet (20 mg total) by  mouth daily.   Melatonin 10 MG TABS Take 10 mg by mouth at bedtime.    methocarbamol (ROBAXIN) 500 MG tablet Take 1 tablet (500 mg total) by mouth 2 (two) times daily as needed.   metoprolol tartrate (LOPRESSOR) 50 MG tablet Take 1 tablet (50 mg total) by mouth 2 (two) times daily.   mometasone (NASONEX) 50 MCG/ACT nasal spray Place 2 sprays into the nose daily.   Multiple Vitamin (ONE-A-DAY 55 PLUS PO) Take 1 tablet by mouth daily.   nystatin (MYCOSTATIN) 100000 UNIT/ML suspension Take 5 mLs (500,000 Units total) by mouth 4 (four) times daily.   ofloxacin (OCUFLOX) 0.3 % ophthalmic solution Instill 1 drop TID in operative eye(s) starting 2 days prior to surgery and after surgery for 3 weeks.   Omega-3 Fatty Acids (FISH OIL) 1000 MG CAPS Take 1,000 mg by mouth daily.   omeprazole (PRILOSEC) 20 MG capsule Take 1 capsule by mouth twice daily   prednisoLONE acetate (PRED FORTE) 1 % ophthalmic suspension One drop in operative eye(s) TID starting 2 days before surgery and continuing after surgery for 3 weeks (Patient not taking: Reported on 04/27/2022)   predniSONE (DELTASONE) 20 MG tablet Take 2 tablets (40 mg total) by mouth daily.   rosuvastatin (CRESTOR) 40 MG tablet Take 1 tablet by mouth once daily   sildenafil (VIAGRA) 100 MG tablet Take 1 tablet (100 mg total) by mouth as needed for erectile dysfunction.   solifenacin (VESICARE) 5 MG tablet Take 1 tablet (5 mg total) by mouth daily. (Patient taking differently: Take 5 mg by mouth daily as needed (bladder).)   tadalafil (CIALIS) 20 MG tablet Take 20 mg by mouth as needed.   terazosin (HYTRIN) 5 MG capsule Take 5 mg by mouth at bedtime.    Testosterone 20.25 MG/1.25GM (1.62%) GEL Apply 2 Packages topically daily.   thiamine 250 MG tablet Take 250 mg by mouth daily.   traZODone (DESYREL) 100 MG tablet Take 1 tablet (100 mg total) by mouth at bedtime as needed for sleep.   triamcinolone cream (KENALOG) 0.5 % Apply 1 application topically 3 (three)  times daily.   Turmeric 500 MG TABS Take 500 mg by mouth daily.   No facility-administered encounter medications on file as of 08/14/2022.    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   DYSPNEA/SHORTNESS OF BREATH 09/16/2007   ERECTILE DYSFUNCTION 03/28/2007   ESOPHAGEAL STRICTURE 09/16/2007   ESOPHAGITIS 03/24/2007   GERD  03/24/2007   GLUCOSE INTOLERANCE 09/16/2007   HEMORRHOIDS, INTERNAL 03/24/2007   HYPERCHOLESTEROLEMIA 03/24/2007   HYPERLIPIDEMIA 09/16/2007   HYPERTENSION 03/28/2007   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/2011   Impaired glucose tolerance 10/04/2010   LOW BACK PAIN 03/28/2007   MAGNETIC RESONANCE IMAGING, BRAIN, ABNORMAL 06/26/2009   NECK PAIN, LEFT 06/20/2010   OBESITY 03/24/2007   OTITIS MEDIA, LEFT 06/10/2009   Palpitations 06/20/2010   no current problems   PVD 06/25/2009   RASH-NONVESICULAR 06/17/2009   Sleep apnea 12/25/2021   URI 08/16/2009   Past Surgical History:  Procedure Laterality Date   COLONOSCOPY  03/2013   polyps/[place]   LEFT HEART CATH AND CORONARY ANGIOGRAPHY N/A 10/14/2021   Procedure: LEFT HEART CATH AND CORONARY ANGIOGRAPHY;  Surgeon: Jettie Booze, MD;  Location: [facility];  Service: Cardiovascular;  Laterality: N/A;   NASAL SEPTOPLASTY W/ TURBINOPLASTY Bilateral 01/03/2021   Procedure: NASAL SEPTOPLASTY WITH BILATERAL TURBINATE REDUCTION;  Surgeon: Leta Baptist, MD;  Location: [facility];  Service: ENT;  Laterality:  Bilateral;   s/p right knee arthroscopy     UPPER GASTROINTESTINAL ENDOSCOPY     [facility]   Family History  Problem Relation Age of Onset   Lymphoma Mother    Heart disease Father        CHF   Ovarian cancer Sister    Lung cancer Sister    Coronary artery disease Sister        CABG   Coronary artery disease Brother        stent, and CAD   Colon cancer Neg Hx    Esophageal cancer Neg Hx    Rectal cancer Neg Hx    Stomach cancer Neg Hx    Colon polyps Neg Hx    Social History   Socioeconomic History   Marital status: Married    Spouse name: Daisy   Number of children: 2   Years of education: Not on file   Highest education level: Not on file  Occupational History   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: [employer]  Tobacco Use   Smoking status: Former    Packs/day: 0.50    Years: 15.00    Total pack years: 7.50    Types: Cigarettes    Quit date: 06/23/1991    Years since quitting: 31.1   Smokeless tobacco: Never   Tobacco comments:    Quit 30 years ago  Vaping Use   Vaping Use: Never used  Substance and Sexual Activity   Alcohol use: Yes    Alcohol/week: 7.0 standard drinks of alcohol    Types: 7 Cans of beer per week    Comment: beer most days   Drug use: No   Sexual activity: Yes  Other Topics Concern   Not on file  Social History Narrative   Not on file   Social Determinants of Health   Financial Resource Strain: Low Risk  (08/14/2022)   Overall Financial Resource Strain (CARDIA)    Difficulty of Paying Living Expenses: Not hard at all  Food Insecurity: No Food Insecurity (08/14/2022)   Hunger Vital Sign    Worried About Running Out of Food in the Last Year: Never true    Ran Out of Food in the Last Year: Never true  Transportation Needs: No Transportation Needs (08/14/2022)   PRAPARE - Hydrologist (Medical): No    Lack of Transportation (Non-Medical): No  Physical Activity: Inactive (08/14/2022)  Exercise  Vital Sign    Days of Exercise per Week: 0 days    Minutes of Exercise per Session: 0 min  Stress: No Stress Concern Present (08/14/2022)   [place]    Feeling of Stress : Not at all  Social Connections: [place] (08/14/2022)   Social Connection and Isolation Panel [NHANES]    Frequency of Communication with Friends and Family: More than three times a week    Frequency of Social Gatherings with Friends and Family: More than three times a week    Attends Religious Services: More than 4 times per year    Active Member of [employer] or Organizations: No    Attends Music therapist: More than 4 times per year    Marital Status: Married    Tobacco Counseling Counseling given: Not Answered Tobacco comments: Quit 30 years ago   Clinical Intake:  Pre-visit preparation completed: Yes  Pain : No/denies pain Pain Score: 0-No pain     BMI - recorded: 36.48 Nutritional Status: BMI > 30  Obese Nutritional Risks: None Diabetes: No  How often do you need to have someone help you when you read instructions, pamphlets, or other written materials from your doctor or pharmacy?: 1 - Never What is the last grade level you completed in school?: HSG  Diabetic? No  Interpreter Needed?: No  Information entered by :: Lisette Abu, LPN.   Activities of Daily Living    08/14/2022   10:53 AM  In your present state of health, do you have any difficulty performing the following activities:  Hearing? 0  Vision? 0  Difficulty concentrating or making decisions? 0  Walking or climbing stairs? 0  Dressing or bathing? 0  Doing errands, shopping? 0  Preparing Food and eating ? N  Using the Toilet? N  In the past six months, have you accidently leaked urine? N  Do you have problems with loss of bowel control? N  Managing your Medications? N  Managing your Finances? N  Housekeeping or managing your  Housekeeping? N    Patient Care Team: Biagio Borg, MD as PCP - General Branch, Royetta Crochet, MD as PCP - Cardiology (Cardiology) Rutherford Guys, MD as Consulting Physician (Ophthalmology)  Indicate any recent Medical Services you may have received from other than [facility] providers in the past year (date may be approximate).     Assessment:   This is a routine wellness examination for Warren Weeks.  Hearing/Vision screen Hearing Screening - Comments:: Denies hearing difficulties    Vision Screening - Comments:: Cataracts removed - up to date with routine eye exams with Rutherford Guys, MD.   Dietary issues and exercise activities discussed: Current Exercise Habits: The patient does not participate in regular exercise at present, Exercise limited by: orthopedic condition(s)   Goals Addressed             This Visit's Progress    DIET - INCREASE WATER INTAKE        Depression Screen    08/14/2022   10:50 AM 07/03/2022   10:55 AM 02/06/2022   10:43 AM 08/13/2021   10:55 AM 03/12/2021   10:10 AM 08/01/2020   10:25 AM 07/05/2020   10:04 AM  PHQ 2/9 Scores  PHQ - 2 Score 0 0 0 0 0 0 0  PHQ- 9 Score   5        Fall Risk    08/14/2022   10:50 AM 07/03/2022  10:55 AM 02/06/2022   10:44 AM 08/13/2021   10:57 AM 03/12/2021   10:10 AM  Fall Risk   Falls in the past year? 0 0 0 0 0  Number falls in past yr: 0   0 0  Injury with Fall? 0 0  0 0  Risk for fall due to : No Fall Risks No Fall Risks  No Fall Risks   Follow up Falls prevention discussed Falls evaluation completed  Falls evaluation completed     [place]:  Any stairs in or around the home? Yes  If so, are there any without handrails? No  Home free of loose throw rugs in walkways, pet beds, electrical cords, etc? Yes  Adequate lighting in your home to reduce risk of falls? Yes   ASSISTIVE DEVICES UTILIZED TO PREVENT FALLS:  Life alert? No  Use of a cane, walker or w/c? Yes  Grab bars in the  bathroom? No  Shower chair or bench in shower? No  Elevated toilet seat or a handicapped toilet? Yes   TIMED UP AND GO:  Was the test performed? No . Telephonic Visit   Cognitive Function:        08/14/2022   10:50 AM  6CIT Screen  What Year? 0 points  What month? 0 points  What time? 0 points  Count back from 20 0 points  Months in reverse 0 points  Repeat phrase 0 points  Total Score 0 points    Immunizations Immunization History  Administered Date(s) Administered   Fluad Quad(high Dose 65+) 02/21/2019, 03/12/2021   H1N1 04/22/2008   Influenza Whole 06/10/2009   Influenza, High Dose Seasonal PF 03/20/2016, 03/16/2017, 03/16/2018   Influenza,inj,Quad PF,6+ Mos 04/10/2015   Influenza-Unspecified 03/22/2012, 09/03/2014, 04/22/2020, 04/06/2022   PFIZER(Purple Top)SARS-COV-2 Vaccination 07/24/2019, 08/21/2019, 06/24/2020   Pneumococcal Conjugate-13 12/12/2013   Pneumococcal Polysaccharide-23 12/14/2014   RSV,unspecified 04/06/2022   Td 10/03/2008   Tdap 12/07/2018   Zoster Recombinat (Shingrix) 09/24/2017, 12/20/2017   Zoster, Live 10/12/2012    TDAP status: Up to date  Flu Vaccine status: Up to date  Pneumococcal vaccine status: Up to date  Covid-19 vaccine status: Completed vaccines  Qualifies for Shingles Vaccine? Yes   Zostavax completed Yes   Shingrix Completed?: Yes  Screening Tests Health Maintenance  Topic Date Due   COVID-19 Vaccine (4 - 2023-24 season) 02/20/2022   COLONOSCOPY (Pts 45-78yr Insurance coverage will need to be confirmed)  03/05/2023   Medicare Annual Wellness (AWV)  08/15/2023   DTaP/Tdap/Td (3 - Td or Tdap) 12/06/2028   Pneumonia Vaccine 75 Years old  Completed   INFLUENZA VACCINE  Completed   Hepatitis C Screening  Completed   Zoster Vaccines- Shingrix  Completed   HPV VACCINES  Aged Out    Health Maintenance  Health Maintenance Due  Topic Date Due   COVID-19 Vaccine (4 - 2023-24 season) 02/20/2022    Colorectal  cancer screening: Type of screening: Colonoscopy. Completed 03/04/2018. Repeat every 5 years  Lung Cancer Screening: (Low Dose CT Chest recommended if Age 75-80years, 30 pack-year currently smoking OR have quit w/in 15years.) does not qualify.   Lung Cancer Screening Referral: no  Additional Screening:  Hepatitis C Screening: does qualify; Completed 10/24/2015  Vision Screening: Recommended annual ophthalmology exams for early detection of glaucoma and other disorders of the eye. Is the patient up to date with their annual eye exam?  Yes  Who is the provider or what is the  name of the office in which the patient attends annual eye exams? Rutherford Guys, MD. If pt is not established with a provider, would they like to be referred to a provider to establish care? No .   Dental Screening: Recommended annual dental exams for proper oral hygiene  Community Resource Referral / Chronic Care Management: CRR required this visit?  No   CCM required this visit?  No      Plan:     I have personally reviewed and noted the following in the patient's chart:   Medical and social history Use of alcohol, tobacco or illicit drugs  Current medications and supplements including opioid prescriptions. Patient is not currently taking opioid prescriptions. Functional ability and status Nutritional status Physical activity Advanced directives List of other physicians Hospitalizations, surgeries, and ER visits in previous 12 months Vitals Screenings to include cognitive, depression, and falls Referrals and appointments  In addition, I have reviewed and discussed with patient certain preventive protocols, quality metrics, and best practice recommendations. A written personalized care plan for preventive services as well as general preventive health recommendations were provided to patient.     Sheral Flow, LPN   [id number]   Nurse Notes: Normal cognitive status assessed by direct observation by  this Nurse Health Advisor. No abnormalities found.

## 2022-08-19 ENCOUNTER — Ambulatory Visit: Payer: Medicare PPO | Admitting: Pulmonary Disease

## 2022-08-19 ENCOUNTER — Encounter: Payer: Self-pay | Admitting: Pulmonary Disease

## 2022-08-19 VITALS — BP 140/68 | HR 72 | Ht 69.0 in | Wt 250.0 lb

## 2022-08-19 DIAGNOSIS — G4733 Obstructive sleep apnea (adult) (pediatric): Secondary | ICD-10-CM | POA: Diagnosis not present

## 2022-08-19 MED ORDER — FLUTICASONE-SALMETEROL 250-50 MCG/ACT IN AEPB: INHALATION_SPRAY | RESPIRATORY_TRACT | 5 refills | Status: DC

## 2022-08-19 NOTE — Patient Instructions (Signed)
DME referral for pressure change  Decrease pressure from 20/16-16/12  Try and use her machine on a nightly basis Use the best mask that fits well that is not as burdensome  Keep the appointment that you have in May for follow-up  Call us with significant concerns

## 2022-08-19 NOTE — Progress Notes (Signed)
Warren Weeks    QJ:[id number]    [birth year]  Primary Care Physician:Weeks, [name], MD  Referring Physician: Biagio Borg, MD [address] [place],  [place] [address]  Chief complaint:   Patient being seen for shortness of breath  HPI:  Patient does have a history of obstructive sleep apnea, has not been using his BiPAP He did have a illness about December that required urgent care visits, once recovered, was unable to tolerate his BiPAP  Has not been using it over the last about a month  Has some difficulty with the mask, some difficulty with the pressure is Feels leaks a lot Trying to use the machine was actually keeping him up more at night  He uses Advair twice daily  He does have a history of snoring, daytime sleepiness, insomnia, he does take melatonin -Feels better overall  He did smoke in the past, quit many years ago  Retired from working in [employer]  Outpatient Encounter Medications as of 08/19/2022  Medication Sig   acetaminophen (TYLENOL) 650 MG CR tablet Take 650 mg by mouth every 8 (eight) hours as needed for pain.   aspirin 81 MG EC tablet Take 1 tablet (81 mg total) by mouth daily. Swallow whole.   Cyanocobalamin (B-12 PO) Take 1 tablet by mouth daily.   Cyanocobalamin POWD Take 1 tablet by mouth daily.   diclofenac Sodium (VOLTAREN) 1 % GEL Apply 2 g topically daily as needed (knee pain).   Diclofenac Sodium CR 100 MG 24 hr tablet Take 1 tablet (100 mg total) by mouth daily.   ezetimibe (ZETIA) 10 MG tablet Take 1 tablet by mouth once daily   Ferrous Sulfate (IRON PO) Take 1 tablet by mouth daily.   fluticasone (FLONASE) 50 MCG/ACT nasal spray Use 2 spray(s) in each nostril once daily   fluticasone-salmeterol (ADVAIR) 250-50 MCG/ACT AEPB INHALE 1 DOSE BY MOUTH ONCE DAILY IN THE MORNING AND 1 ONCE DAILY IN THE EVENING   hydrochlorothiazide (HYDRODIURIL) 25 MG tablet Take 1 tablet by mouth once daily   ketorolac (ACULAR) 0.5 % ophthalmic  solution    lisinopril (ZESTRIL) 20 MG tablet Take 1 tablet (20 mg total) by mouth daily.   Melatonin 10 MG TABS Take 10 mg by mouth at bedtime.    methocarbamol (ROBAXIN) 500 MG tablet Take 1 tablet (500 mg total) by mouth 2 (two) times daily as needed.   metoprolol tartrate (LOPRESSOR) 50 MG tablet Take 1 tablet (50 mg total) by mouth 2 (two) times daily.   mometasone (NASONEX) 50 MCG/ACT nasal spray Place 2 sprays into the nose daily.   Multiple Vitamin (ONE-A-DAY 55 PLUS PO) Take 1 tablet by mouth daily.   nystatin (MYCOSTATIN) 100000 UNIT/ML suspension Take 5 mLs (500,000 Units total) by mouth 4 (four) times daily.   Omega-3 Fatty Acids (FISH OIL) 1000 MG CAPS Take 1,000 mg by mouth daily.   omeprazole (PRILOSEC) 20 MG capsule Take 1 capsule by mouth twice daily   prednisoLONE acetate (PRED FORTE) 1 % ophthalmic suspension    rosuvastatin (CRESTOR) 40 MG tablet Take 1 tablet by mouth once daily   sildenafil (VIAGRA) 100 MG tablet Take 1 tablet (100 mg total) by mouth as needed for erectile dysfunction.   solifenacin (VESICARE) 5 MG tablet Take 1 tablet (5 mg total) by mouth daily. (Patient taking differently: Take 5 mg by mouth daily as needed (bladder).)   tadalafil (CIALIS) 20 MG tablet Take  20 mg by mouth as needed.   terazosin (HYTRIN) 5 MG capsule Take 5 mg by mouth at bedtime.    Testosterone 20.25 MG/1.25GM (1.62%) GEL Apply 2 Packages topically daily.   thiamine 250 MG tablet Take 250 mg by mouth daily.   traZODone (DESYREL) 100 MG tablet Take 1 tablet (100 mg total) by mouth at bedtime as needed for sleep.   triamcinolone cream (KENALOG) 0.5 % Apply 1 application topically 3 (three) times daily.   Turmeric 500 MG TABS Take 500 mg by mouth daily.   albuterol (VENTOLIN HFA) 108 (90 Base) MCG/ACT inhaler Inhale 2 puffs into the lungs every 6 (six) hours as needed for wheezing or shortness of breath. (Patient not taking: Reported on 08/19/2022)   albuterol (VENTOLIN HFA) 108 (90 Base)  MCG/ACT inhaler Inhale 1-2 puffs into the lungs every 6 (six) hours as needed for wheezing or shortness of breath. (Patient not taking: Reported on 08/19/2022)   benzonatate (TESSALON) 100 MG capsule Take 1 capsule (100 mg total) by mouth every 8 (eight) hours. (Patient not taking: Reported on 08/19/2022)   doxycycline (VIBRA-TABS) 100 MG tablet Take 1 tablet (100 mg total) by mouth 2 (two) times daily. (Patient not taking: Reported on 08/19/2022)   HYDROcodone bit-homatropine (HYCODAN) 5-1.5 MG/5ML syrup Take 5 mLs by mouth every 6 (six) hours as needed for cough. (Patient not taking: Reported on 08/19/2022)   ofloxacin (OCUFLOX) 0.3 % ophthalmic solution Instill 1 drop TID in operative eye(s) starting 2 days prior to surgery and after surgery for 3 weeks. (Patient not taking: Reported on 08/19/2022)   predniSONE (DELTASONE) 20 MG tablet Take 2 tablets (40 mg total) by mouth daily. (Patient not taking: Reported on 08/19/2022)   [DISCONTINUED] azithromycin (ZITHROMAX Z-PAK) 250 MG tablet 2 tablets initially, then 1 tablet daily until completed. (Patient not taking: Reported on 08/19/2022)   [DISCONTINUED] HYDROcodone bit-homatropine (HYCODAN) 5-1.5 MG/5ML syrup Take 5 mLs by mouth every 6 (six) hours as needed for cough. (Patient not taking: Reported on 08/19/2022)   No facility-administered encounter medications on file as of 08/19/2022.    Allergies as of 08/19/2022 - Review Complete 08/19/2022  Allergen Reaction Noted   Cefuroxime axetil Rash 06/17/2009    Past Medical History:  Diagnosis Date   ALLERGIC RHINITIS 09/16/2007   ALLERGY 03/24/2007   Allergy    ANXIETY 03/28/2007   Arthritis    knees, back,shoulder   CAROTID ARTERY STENOSIS, BILATERAL 07/01/2009   yearly checks - no current problems   CEPHALGIA 06/08/2009   CHEST PAIN 09/16/2007   no current problems   COLONIC POLYPS, HX OF 09/16/2007   COPD (chronic obstructive pulmonary disease) ([place]) 05/12/2017   mild per patient - no inhaler    Dizziness and giddiness 06/08/2009   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   DYSPNEA/SHORTNESS OF BREATH 09/16/2007   ERECTILE DYSFUNCTION 03/28/2007   ESOPHAGEAL STRICTURE 09/16/2007   ESOPHAGITIS 03/24/2007   GERD 03/24/2007   GLUCOSE INTOLERANCE 09/16/2007   HEMORRHOIDS, INTERNAL 03/24/2007   HYPERCHOLESTEROLEMIA 03/24/2007   HYPERLIPIDEMIA 09/16/2007   HYPERTENSION 03/28/2007   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/2011   Impaired glucose tolerance 10/04/2010   LOW BACK PAIN 03/28/2007   MAGNETIC RESONANCE IMAGING, BRAIN, ABNORMAL 06/26/2009   NECK PAIN, LEFT 06/20/2010   OBESITY 03/24/2007   OTITIS MEDIA, LEFT 06/10/2009   Palpitations 06/20/2010   no current problems   PVD 06/25/2009   RASH-NONVESICULAR 06/17/2009   Sleep apnea 12/25/2021   URI 08/16/2009    Past Surgical History:  Procedure Laterality Date  COLONOSCOPY  03/2013   polyps/[place]   LEFT HEART CATH AND CORONARY ANGIOGRAPHY N/A 10/14/2021   Procedure: LEFT HEART CATH AND CORONARY ANGIOGRAPHY;  Surgeon: Jettie Booze, MD;  Location: [facility];  Service: Cardiovascular;  Laterality: N/A;   NASAL SEPTOPLASTY W/ TURBINOPLASTY Bilateral 01/03/2021   Procedure: NASAL SEPTOPLASTY WITH BILATERAL TURBINATE REDUCTION;  Surgeon: Leta Baptist, MD;  Location: [facility];  Service: ENT;  Laterality: Bilateral;   s/p right knee arthroscopy     UPPER GASTROINTESTINAL ENDOSCOPY     gerd    Family History  Problem Relation Age of Onset   Lymphoma Mother    Heart disease Father        CHF   Ovarian cancer Sister    Lung cancer Sister    Coronary artery disease Sister        CABG   Coronary artery disease Brother        stent, and CAD   Colon cancer Neg Hx    Esophageal cancer Neg Hx    Rectal cancer Neg Hx    Stomach cancer Neg Hx    Colon polyps Neg Hx     Social History   Socioeconomic History   Marital status: Married    Spouse name: Daisy   Number of children: 2   Years of education: Not on file   Highest education  level: Not on file  Occupational History   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: [employer]  Tobacco Use   Smoking status: Former    Packs/day: 0.50    Years: 15.00    Total pack years: 7.50    Types: Cigarettes    Quit date: 06/23/1991    Years since quitting: 31.1   Smokeless tobacco: Never   Tobacco comments:    Quit 30 years ago  Vaping Use   Vaping Use: Never used  Substance and Sexual Activity   Alcohol use: Yes    Alcohol/week: 7.0 standard drinks of alcohol    Types: 7 Cans of beer per week    Comment: beer most days   Drug use: No   Sexual activity: Yes  Other Topics Concern   Not on file  Social History Narrative   Not on file   Social Determinants of Health   Financial Resource Strain: Low Risk  (08/14/2022)   Overall Financial Resource Strain (CARDIA)    Difficulty of Paying Living Expenses: Not hard at all  Food Insecurity: No Food Insecurity (08/14/2022)   Hunger Vital Sign    Worried About Running Out of Food in the Last Year: Never true    Ran Out of Food in the Last Year: Never true  Transportation Needs: No Transportation Needs (08/14/2022)   PRAPARE - Hydrologist (Medical): No    Lack of Transportation (Non-Medical): No  Physical Activity: Inactive (08/14/2022)   Exercise Vital Sign    Days of Exercise per Week: 0 days    Minutes of Exercise per Session: 0 min  Stress: No Stress Concern Present (08/14/2022)   [place]    Feeling of Stress : Not at all  Social Connections: [place] (08/14/2022)   Social Connection and Isolation Panel [NHANES]    Frequency of Communication with Friends and Family: More than three times a week    Frequency of Social Gatherings with Friends and Family: More than three times a week  Attends Religious Services: More than 4 times per year    Active Member of Clubs or Organizations: No    Attends English as a second language teacher Meetings: More than 4 times per year    Marital Status: Married  Human resources officer Violence: Not At Risk (08/14/2022)   Humiliation, Afraid, Rape, and Kick questionnaire    Fear of Current or Ex-Partner: No    Emotionally Abused: No    Physically Abused: No    Sexually Abused: No    Review of Systems  Respiratory:  Positive for shortness of breath.   Musculoskeletal:  Positive for arthralgias and back pain.    Vitals:   08/19/22 0848  BP: (!) 140/68  Pulse: 72  SpO2: 99%     Physical Exam Constitutional:      Appearance: He is obese.  HENT:     Head: Normocephalic.     Mouth/Throat:     Mouth: Mucous membranes are moist.  Eyes:     General: No scleral icterus.    Pupils: Pupils are equal, round, and reactive to light.  Cardiovascular:     Rate and Rhythm: Normal rate and regular rhythm.     Heart sounds: No murmur heard.    No friction rub.  Pulmonary:     Effort: No respiratory distress.     Breath sounds: No stridor. No wheezing or rhonchi.  Musculoskeletal:     Cervical back: No rigidity or tenderness.  Neurological:     Mental Status: He is alert.  Psychiatric:        Mood and Affect: Mood normal.    Data Reviewed: Recent cardiac CT reviewed showing mild emphysema, some scarring in the left lingula  Past pulmonary function test 12/16/2021-no obstructive or restrictive defect, normal diffusing capacity  Echocardiogram showed grade 1 diastolic dysfunction with mild elevation of pulmonary pressures  Most recent sleep study showing moderate obstructive sleep apnea, titrated to BiPAP  BiPAP compliance reviewed showing he has not used it much in the last month 53% compliance Average use of 5 hours 35 minutes Machine is set at 20/16 Appears to be leaking significantly Residual AHI of 3.4  Assessment:  Multifactorial shortness of breath  Emphysema  Diastolic dysfunction on recent echo  He does have mild pulmonary hypertension  Moderate  obstructive sleep apnea on BiPAP therapy  The importance of treating the sleep disordered breathing was discussed with him The importance of regular exercises discussed with him  Plan/Recommendations: He does have a repeat CT scan of the chest in April which we will follow-up on  Will follow-up on the pulmonary function test as well  Encouraged to start using his BiPAP on a regular basis  Will make BiPAP adjustments -I did review his study and a pressure of about 16/12 may still work adequately to treat events  DME referral for pressure change  Encouraged to continue using Advair  Follow-up in 3 months, has an appointment about May   Sherrilyn Rist MD [facility] 08/19/2022, 8:57 AM  CC: Biagio Borg, MD

## 2022-08-25 ENCOUNTER — Other Ambulatory Visit: Payer: Self-pay | Admitting: Internal Medicine

## 2022-08-28 DIAGNOSIS — G4733 Obstructive sleep apnea (adult) (pediatric): Secondary | ICD-10-CM | POA: Diagnosis not present

## 2022-09-01 ENCOUNTER — Encounter: Payer: Self-pay | Admitting: Emergency Medicine

## 2022-09-01 ENCOUNTER — Ambulatory Visit: Payer: Medicare PPO | Admitting: Emergency Medicine

## 2022-09-01 VITALS — BP 136/78 | HR 62 | Temp 98.1°F | Ht 69.0 in | Wt 247.2 lb

## 2022-09-01 DIAGNOSIS — R109 Unspecified abdominal pain: Secondary | ICD-10-CM

## 2022-09-01 MED ORDER — TRAMADOL HCL 50 MG PO TABS: 50.0000 mg | ORAL_TABLET | Freq: Three times a day (TID) | ORAL | 0 refills | Status: AC | PRN

## 2022-09-01 NOTE — Assessment & Plan Note (Signed)
Clinically stable.  No red flag signs or symptoms. Differential diagnosis discussed. Most likely musculoskeletal in origin. Pain management discussed. Recommend renal CT scan ED precautions given Advised to contact the office if no better or worse during the next several days

## 2022-09-01 NOTE — Progress Notes (Signed)
Warren Weeks 77 y.o.   Chief Complaint  Patient presents with   Back Pain    Pain right flank, x 2 weeks     HISTORY OF PRESENT ILLNESS: Acute problem visit today.  Patient of Dr. Cathlean Cower This is a 77 y.o. male complaining of pain to right flank area for the past 2 weeks.  Denies injuries. Denies urinary symptoms.  Denies associated symptoms. No other complaints or medical concerns today.  HPI   Prior to Admission medications   Medication Sig Start Date End Date Taking? Authorizing Provider  acetaminophen (TYLENOL) 650 MG CR tablet Take 650 mg by mouth every 8 (eight) hours as needed for pain.   Yes [provider]  albuterol (VENTOLIN HFA) 108 (90 Base) MCG/ACT inhaler Inhale 1-2 puffs into the lungs every 6 (six) hours as needed for wheezing or shortness of breath. 06/19/22  Yes Nyoka Lint, PA-C  aspirin 81 MG EC tablet Take 1 tablet (81 mg total) by mouth daily. Swallow whole. 10/07/21  Yes Biagio Borg, MD  Cyanocobalamin (B-12 PO) Take 1 tablet by mouth daily.   Yes [provider]  Cyanocobalamin POWD Take 1 tablet by mouth daily. 04/22/21  Yes [provider]  diclofenac Sodium (VOLTAREN) 1 % GEL Apply 2 g topically daily as needed (knee pain).   Yes [provider]  Diclofenac Sodium CR 100 MG 24 hr tablet Take 1 tablet by mouth once daily 08/25/22  Yes Biagio Borg, MD  ezetimibe (ZETIA) 10 MG tablet Take 1 tablet by mouth once daily 04/16/22  Yes Biagio Borg, MD  Ferrous Sulfate (IRON PO) Take 1 tablet by mouth daily. 04/22/21  Yes [provider]  fluticasone Asencion Islam) 50 MCG/ACT nasal spray Use 2 spray(s) in each nostril once daily 01/15/22  Yes Biagio Borg, MD  fluticasone-salmeterol (ADVAIR) 250-50 MCG/ACT AEPB INHALE 1 DOSE BY MOUTH ONCE DAILY IN THE MORNING AND 1 ONCE DAILY IN THE EVENING 08/19/22  Yes Olalere, Adewale A, MD  hydrochlorothiazide (HYDRODIURIL) 25 MG tablet Take 1 tablet by mouth once daily 12/18/21  Yes  Biagio Borg, MD  ketorolac (ACULAR) 0.5 % ophthalmic solution  04/23/22  Yes [provider]  lisinopril (ZESTRIL) 20 MG tablet Take 1 tablet (20 mg total) by mouth daily. 07/03/22  Yes Biagio Borg, MD  Melatonin 10 MG TABS Take 10 mg by mouth at bedtime.    Yes [provider]  methocarbamol (ROBAXIN) 500 MG tablet Take 1 tablet (500 mg total) by mouth 2 (two) times daily as needed. 02/06/22  Yes Biagio Borg, MD  metoprolol tartrate (LOPRESSOR) 50 MG tablet Take 1 tablet (50 mg total) by mouth 2 (two) times daily. 07/03/22  Yes Biagio Borg, MD  mometasone (NASONEX) 50 MCG/ACT nasal spray Place 2 sprays into the nose daily. 06/05/22  Yes Nyoka Lint, PA-C  Multiple Vitamin (ONE-A-DAY 55 PLUS PO) Take 1 tablet by mouth daily.   Yes [provider]  nystatin (MYCOSTATIN) 100000 UNIT/ML suspension Take 5 mLs (500,000 Units total) by mouth 4 (four) times daily. 07/03/22  Yes Biagio Borg, MD  Omega-3 Fatty Acids (FISH OIL) 1000 MG CAPS Take 1,000 mg by mouth daily.   Yes [provider]  omeprazole (PRILOSEC) 20 MG capsule Take 1 capsule by mouth twice daily 10/14/21  Yes Biagio Borg, MD  prednisoLONE acetate (PRED FORTE) 1 % ophthalmic suspension  04/23/22  Yes [provider]  rosuvastatin (CRESTOR) 40  MG tablet Take 1 tablet by mouth once daily 10/02/21  Yes Biagio Borg, MD  sildenafil (VIAGRA) 100 MG tablet Take 1 tablet (100 mg total) by mouth as needed for erectile dysfunction. 12/20/12  Yes Biagio Borg, MD  solifenacin (VESICARE) 5 MG tablet Take 1 tablet (5 mg total) by mouth daily. Patient taking differently: Take 5 mg by mouth daily as needed (bladder). 07/05/20  Yes Biagio Borg, MD  tadalafil (CIALIS) 20 MG tablet Take 20 mg by mouth as needed. 02/20/22  Yes [provider]  terazosin (HYTRIN) 5 MG capsule Take 5 mg by mouth at bedtime.  03/22/15  Yes [provider]  Testosterone 20.25 MG/1.25GM (1.62%) GEL Apply 2 Packages  topically daily. 08/05/20  Yes [provider]  thiamine 250 MG tablet Take 250 mg by mouth daily.   Yes [provider]  traZODone (DESYREL) 100 MG tablet Take 1 tablet (100 mg total) by mouth at bedtime as needed for sleep. 02/06/22  Yes Biagio Borg, MD  triamcinolone cream (KENALOG) 0.5 % Apply 1 application topically 3 (three) times daily. 12/07/19  Yes Biagio Borg, MD  Turmeric 500 MG TABS Take 500 mg by mouth daily.   Yes [provider]  albuterol (VENTOLIN HFA) 108 (90 Base) MCG/ACT inhaler Inhale 2 puffs into the lungs every 6 (six) hours as needed for wheezing or shortness of breath. Patient not taking: Reported on 08/19/2022 06/19/22   Nyoka Lint, PA-C  doxycycline (VIBRA-TABS) 100 MG tablet Take 1 tablet (100 mg total) by mouth 2 (two) times daily. Patient not taking: Reported on 08/19/2022 07/03/22   Biagio Borg, MD  HYDROcodone bit-homatropine [facility]) 5-1.5 MG/5ML syrup Take 5 mLs by mouth every 6 (six) hours as needed for cough. Patient not taking: Reported on 08/19/2022 06/19/22   Nyoka Lint, PA-C  ofloxacin (OCUFLOX) 0.3 % ophthalmic solution Instill 1 drop TID in operative eye(s) starting 2 days prior to surgery and after surgery for 3 weeks. Patient not taking: Reported on 08/19/2022 04/23/22   [provider]  predniSONE (DELTASONE) 20 MG tablet Take 2 tablets (40 mg total) by mouth daily. Patient not taking: Reported on 08/19/2022 06/22/22   Blanchie Dessert, MD    Allergies  Allergen Reactions   Cefuroxime Axetil Rash    Patient Active Problem List   Diagnosis Date Noted   Thrush 07/03/2022   Left leg pain 02/08/2022   Hoarseness 02/08/2022   Cramp in lower leg 02/08/2022   Left hand pain 02/08/2022   Sleep apnea 12/25/2021   Loud snoring 10/17/2021   Coronary artery calcification    Degenerative joint disease of cervical spine 03/12/2021   Acute sinus infection 12/25/2020   Iliotibial band syndrome affecting left lower leg  09/08/2020   Neck pain on right side 09/05/2020   OAB (overactive bladder) 07/05/2020   Pain and swelling of right lower leg 07/05/2020   Cervicalgia 07/05/2020   Right knee pain 07/05/2020   PVC's (premature ventricular contractions) 03/02/2020   BPH (benign prostatic hyperplasia) 03/02/2020   Arthritis [id number]   Diastolic dysfunction [id number]   Urinary frequency 09/14/2019   Urinary leakage 12/07/2018   Left knee pain 12/07/2018   Chronic sinusitis 10/05/2018   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   COPD (chronic obstructive pulmonary disease) ([place]) 05/12/2017   Pleurisy 03/16/2017   Bilateral leg pain 11/22/2016   Insomnia 10/13/2016   Cough 07/22/2016   Wheezing 07/22/2016   Peripheral edema 02/21/2016   Venous insufficiency 02/05/2016  Low back pain radiating to lower extremity 02/05/2016   Vertigo    Hypersomnolence 10/24/2015   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   Chest pain 01/18/2013   Hematochezia 10/12/2012   Viral illness 05/26/2012   Palpitations 01/05/2012   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/2011   Fatigue 04/09/2011   Rash 12/22/2010   Encounter for long-term (current) use of high-risk medication 10/08/2010   Impaired glucose tolerance 10/04/2010   Encounter for well adult exam with abnormal findings 10/04/2010   Carotid artery stenosis 07/01/2009   PVD 06/25/2009   Allergic rhinitis 09/16/2007   ESOPHAGEAL STRICTURE 09/16/2007   COLONIC POLYPS, HX OF 09/16/2007   Anxiety state 03/28/2007   ERECTILE DYSFUNCTION 03/28/2007   Hyperlipidemia 03/28/2007   Essential hypertension 03/28/2007   LOW BACK PAIN 03/28/2007   OBESITY 03/24/2007   HEMORRHOIDS, INTERNAL 03/24/2007   ESOPHAGITIS 03/24/2007   GERD 03/24/2007    Past Medical History:  Diagnosis Date   ALLERGIC RHINITIS 09/16/2007   ALLERGY 03/24/2007   Allergy    ANXIETY 03/28/2007   Arthritis    knees, back,shoulder   CAROTID ARTERY STENOSIS, BILATERAL 07/01/2009   yearly checks - no current problems    CEPHALGIA 06/08/2009   CHEST PAIN 09/16/2007   no current problems   COLONIC POLYPS, HX OF 09/16/2007   COPD (chronic obstructive pulmonary disease) ([place]) 05/12/2017   mild per patient - no inhaler   Dizziness and giddiness 06/08/2009   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   DYSPNEA/SHORTNESS OF BREATH 09/16/2007   ERECTILE DYSFUNCTION 03/28/2007   ESOPHAGEAL STRICTURE 09/16/2007   ESOPHAGITIS 03/24/2007   GERD 03/24/2007   GLUCOSE INTOLERANCE 09/16/2007   HEMORRHOIDS, INTERNAL 03/24/2007   HYPERCHOLESTEROLEMIA 03/24/2007   HYPERLIPIDEMIA 09/16/2007   HYPERTENSION 03/28/2007   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/2011   Impaired glucose tolerance 10/04/2010   LOW BACK PAIN 03/28/2007   MAGNETIC RESONANCE IMAGING, BRAIN, ABNORMAL 06/26/2009   NECK PAIN, LEFT 06/20/2010   OBESITY 03/24/2007   OTITIS MEDIA, LEFT 06/10/2009   Palpitations 06/20/2010   no current problems   PVD 06/25/2009   RASH-NONVESICULAR 06/17/2009   Sleep apnea 12/25/2021   URI 08/16/2009    Past Surgical History:  Procedure Laterality Date   COLONOSCOPY  03/2013   polyps/[place]   LEFT HEART CATH AND CORONARY ANGIOGRAPHY N/A 10/14/2021   Procedure: LEFT HEART CATH AND CORONARY ANGIOGRAPHY;  Surgeon: Jettie Booze, MD;  Location: [facility];  Service: Cardiovascular;  Laterality: N/A;   NASAL SEPTOPLASTY W/ TURBINOPLASTY Bilateral 01/03/2021   Procedure: NASAL SEPTOPLASTY WITH BILATERAL TURBINATE REDUCTION;  Surgeon: Leta Baptist, MD;  Location: [facility];  Service: ENT;  Laterality: Bilateral;   s/p right knee arthroscopy     UPPER GASTROINTESTINAL ENDOSCOPY     gerd    Social History   Socioeconomic History   Marital status: Married    Spouse name: Daisy   Number of children: 2   Years of education: Not on file   Highest education level: Not on file  Occupational History   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: [employer]  Tobacco Use   Smoking status: Former    Packs/day: 0.50    Years:  15.00    Total pack years: 7.50    Types: Cigarettes    Quit date: 06/23/1991    Years since quitting: 31.2   Smokeless tobacco: Never   Tobacco comments:    Quit 30 years ago  Vaping Use   Vaping Use: Never used  Substance and Sexual Activity  Alcohol use: Yes    Alcohol/week: 7.0 standard drinks of alcohol    Types: 7 Cans of beer per week    Comment: beer most days   Drug use: No   Sexual activity: Yes  Other Topics Concern   Not on file  Social History Narrative   Not on file   Social Determinants of Health   Financial Resource Strain: Low Risk  (08/14/2022)   Overall Financial Resource Strain (CARDIA)    Difficulty of Paying Living Expenses: Not hard at all  Food Insecurity: No Food Insecurity (08/14/2022)   Hunger Vital Sign    Worried About Running Out of Food in the Last Year: Never true    Ran Out of Food in the Last Year: Never true  Transportation Needs: No Transportation Needs (08/14/2022)   PRAPARE - Hydrologist (Medical): No    Lack of Transportation (Non-Medical): No  Physical Activity: Inactive (08/14/2022)   Exercise Vital Sign    Days of Exercise per Week: 0 days    Minutes of Exercise per Session: 0 min  Stress: No Stress Concern Present (08/14/2022)   [place]    Feeling of Stress : Not at all  Social Connections: [place] (08/14/2022)   Social Connection and Isolation Panel [NHANES]    Frequency of Communication with Friends and Family: More than three times a week    Frequency of Social Gatherings with Friends and Family: More than three times a week    Attends Religious Services: More than 4 times per year    Active Member of [employer] or Organizations: No    Attends Music therapist: More than 4 times per year    Marital Status: Married  Human resources officer Violence: Not At Risk (08/14/2022)   Humiliation, Afraid, Rape, and Kick  questionnaire    Fear of Current or Ex-Partner: No    Emotionally Abused: No    Physically Abused: No    Sexually Abused: No    Family History  Problem Relation Age of Onset   Lymphoma Mother    Heart disease Father        CHF   Ovarian cancer Sister    Lung cancer Sister    Coronary artery disease Sister        CABG   Coronary artery disease Brother        stent, and CAD   Colon cancer Neg Hx    Esophageal cancer Neg Hx    Rectal cancer Neg Hx    Stomach cancer Neg Hx    Colon polyps Neg Hx      Review of Systems  Constitutional: Negative.  Negative for chills and fever.  HENT: Negative.  Negative for congestion and sore throat.   Respiratory: Negative.  Negative for cough and shortness of breath.   Cardiovascular: Negative.  Negative for chest pain and palpitations.  Gastrointestinal: Negative.  Negative for abdominal pain, diarrhea, nausea and vomiting.  Genitourinary:  Positive for flank pain. Negative for dysuria, frequency, hematuria and urgency.  Skin: Negative.  Negative for rash.  Neurological: Negative.  Negative for dizziness and headaches.  All other systems reviewed and are negative.   Vitals:   09/01/22 1521  BP: 136/78  Pulse: 62  Temp: 98.1 F (36.7 C)  SpO2: 97%    Physical Exam Vitals reviewed.  Constitutional:      Appearance: Normal appearance.  HENT:  Head: Normocephalic.  Eyes:     Extraocular Movements: Extraocular movements intact.     Pupils: Pupils are equal, round, and reactive to light.  Cardiovascular:     Rate and Rhythm: Normal rate and regular rhythm.     Pulses: Normal pulses.     Heart sounds: Normal heart sounds.  Pulmonary:     Effort: Pulmonary effort is normal.  Abdominal:     Palpations: Abdomen is soft.     Tenderness: There is no abdominal tenderness. There is no right CVA tenderness or left CVA tenderness.  Musculoskeletal:     Right lower leg: No edema.     Left lower leg: No edema.  Skin:    General:  Skin is warm and dry.     Capillary Refill: Capillary refill takes less than 2 seconds.  Neurological:     General: No focal deficit present.     Mental Status: He is alert and oriented to person, place, and time.  Psychiatric:        Mood and Affect: Mood normal.        Behavior: Behavior normal.      ASSESSMENT & PLAN: Problem List Items Addressed This Visit       Other   Right flank pain - Primary    Clinically stable.  No red flag signs or symptoms. Differential diagnosis discussed. Most likely musculoskeletal in origin. Pain management discussed. Recommend renal CT scan ED precautions given Advised to contact the office if no better or worse during the next several days      Relevant Medications   traMADol (ULTRAM) 50 MG tablet   Other Relevant Orders   CT RENAL STONE STUDY   Patient Instructions  Flank Pain, Adult Flank pain is pain in your side. The flank is the area on your side between your upper belly (abdomen) and your spine. The pain may occur over a short time (acute), or it may be long-term or come back often (chronic). It may be mild or very bad. Pain in this area can be caused by many different things. Follow these instructions at home:  Drink enough fluid to keep your pee (urine) pale yellow. Rest as told by your doctor. Take over-the-counter and prescription medicines only as told by your doctor. Keep a journal to keep track of: What has caused your flank pain. What has made your flank pain feel better. Keep all follow-up visits. Contact a doctor if: Medicine does not help your pain. You have new symptoms. Your pain gets worse. Your symptoms last longer than 2-3 days. You have trouble peeing. You are peeing more often than normal. Get help right away if: You have trouble breathing. You are short of breath. Your belly hurts, or it is swollen or red. You feel like you may vomit (nauseous). You vomit. You feel faint, or you faint. You have  blood in your pee. You have flank pain and a fever. These symptoms may be an emergency. Get help right away. Call your local emergency services (911 in the [place] Do not wait to see if the symptoms will go away. Do not drive yourself to the hospital. Summary Flank pain is pain in your side. The flank is the area of your side between your upper belly (abdomen) and your spine. Flank pain may occur over a short time (acute), or it may be long-term or come back often (chronic). It may be mild or very bad. Pain in this area can be caused by  many different things. Contact your doctor if your symptoms get worse or last longer than 2-3 days. This information is not intended to replace advice given to you by your health care provider. Make sure you discuss any questions you have with your health care provider. Document Revised: 08/19/2020 Document Reviewed: 08/19/2020 Elsevier Patient Education  [place], MD [facility]

## 2022-09-01 NOTE — Patient Instructions (Signed)
Flank Pain, Adult Flank pain is pain in your side. The flank is the area on your side between your upper belly (abdomen) and your spine. The pain may occur over a short time (acute), or it may be long-term or come back often (chronic). It may be mild or very bad. Pain in this area can be caused by many different things. Follow these instructions at home:  Drink enough fluid to keep your pee (urine) pale yellow. Rest as told by your doctor. Take over-the-counter and prescription medicines only as told by your doctor. Keep a journal to keep track of: What has caused your flank pain. What has made your flank pain feel better. Keep all follow-up visits. Contact a doctor if: Medicine does not help your pain. You have new symptoms. Your pain gets worse. Your symptoms last longer than 2-3 days. You have trouble peeing. You are peeing more often than normal. Get help right away if: You have trouble breathing. You are short of breath. Your belly hurts, or it is swollen or red. You feel like you may vomit (nauseous). You vomit. You feel faint, or you faint. You have blood in your pee. You have flank pain and a fever. These symptoms may be an emergency. Get help right away. Call your local emergency services (911 in the [place] Do not wait to see if the symptoms will go away. Do not drive yourself to the hospital. Summary Flank pain is pain in your side. The flank is the area of your side between your upper belly (abdomen) and your spine. Flank pain may occur over a short time (acute), or it may be long-term or come back often (chronic). It may be mild or very bad. Pain in this area can be caused by many different things. Contact your doctor if your symptoms get worse or last longer than 2-3 days. This information is not intended to replace advice given to you by your health care provider. Make sure you discuss any questions you have with your health care provider. Document Revised:  08/19/2020 Document Reviewed: 08/19/2020 Elsevier Patient Education  2023 Elsevier Inc.  

## 2022-09-16 ENCOUNTER — Ambulatory Visit
Admission: RE | Admit: 2022-09-16 | Discharge: 2022-09-16 | Disposition: A | Discharge status: Home / Self Care | Payer: Medicare PPO | Source: Ambulatory Visit | Attending: Emergency Medicine | Admitting: Emergency Medicine

## 2022-09-16 DIAGNOSIS — K661 Hemoperitoneum: Secondary | ICD-10-CM | POA: Diagnosis not present

## 2022-09-16 DIAGNOSIS — I700 Atherosclerosis of aorta: Secondary | ICD-10-CM | POA: Diagnosis not present

## 2022-09-16 DIAGNOSIS — K4020 Bilateral inguinal hernia, without obstruction or gangrene, not specified as recurrent: Secondary | ICD-10-CM | POA: Diagnosis not present

## 2022-09-16 DIAGNOSIS — R109 Unspecified abdominal pain: Secondary | ICD-10-CM

## 2022-09-28 DIAGNOSIS — G4733 Obstructive sleep apnea (adult) (pediatric): Secondary | ICD-10-CM | POA: Diagnosis not present

## 2022-09-30 ENCOUNTER — Ambulatory Visit
Admission: RE | Admit: 2022-09-30 | Discharge: 2022-09-30 | Disposition: A | Discharge status: Home / Self Care | Payer: Medicare PPO | Source: Ambulatory Visit | Attending: Pulmonary Disease | Admitting: Pulmonary Disease

## 2022-09-30 DIAGNOSIS — J479 Bronchiectasis, uncomplicated: Secondary | ICD-10-CM | POA: Diagnosis not present

## 2022-09-30 DIAGNOSIS — J9811 Atelectasis: Secondary | ICD-10-CM | POA: Diagnosis not present

## 2022-09-30 DIAGNOSIS — J449 Chronic obstructive pulmonary disease, unspecified: Secondary | ICD-10-CM | POA: Diagnosis not present

## 2022-09-30 DIAGNOSIS — J439 Emphysema, unspecified: Secondary | ICD-10-CM | POA: Diagnosis not present

## 2022-09-30 DIAGNOSIS — I700 Atherosclerosis of aorta: Secondary | ICD-10-CM | POA: Diagnosis not present

## 2022-10-06 ENCOUNTER — Ambulatory Visit: Payer: Medicare PPO | Admitting: Internal Medicine

## 2022-10-06 ENCOUNTER — Encounter: Payer: Self-pay | Admitting: Internal Medicine

## 2022-10-06 VITALS — BP 120/62 | HR 60 | Temp 97.8°F | Ht 69.0 in | Wt 248.0 lb

## 2022-10-06 DIAGNOSIS — R7302 Impaired glucose tolerance (oral): Secondary | ICD-10-CM

## 2022-10-06 DIAGNOSIS — E538 Deficiency of other specified B group vitamins: Secondary | ICD-10-CM

## 2022-10-06 DIAGNOSIS — R350 Frequency of micturition: Secondary | ICD-10-CM

## 2022-10-06 DIAGNOSIS — E559 Vitamin D deficiency, unspecified: Secondary | ICD-10-CM | POA: Diagnosis not present

## 2022-10-06 DIAGNOSIS — I10 Essential (primary) hypertension: Secondary | ICD-10-CM

## 2022-10-06 DIAGNOSIS — R600 Localized edema: Secondary | ICD-10-CM | POA: Diagnosis not present

## 2022-10-06 DIAGNOSIS — E7800 Pure hypercholesterolemia, unspecified: Secondary | ICD-10-CM

## 2022-10-06 DIAGNOSIS — Z0001 Encounter for general adult medical examination with abnormal findings: Secondary | ICD-10-CM

## 2022-10-06 NOTE — Patient Instructions (Addendum)

## 2022-10-06 NOTE — Assessment & Plan Note (Signed)
BP Readings from Last 3 Encounters:  10/06/22 120/62  09/01/22 136/78  08/19/22 (!) 140/68   Stable, pt to continue medical treatment hct 25 qd, lisionpril 20 qd, lopressor 50 bid

## 2022-10-06 NOTE — Assessment & Plan Note (Signed)
Also for ua and culture, suspect related to possible bph and/or OAB , declines any further med tx change for now

## 2022-10-06 NOTE — Assessment & Plan Note (Signed)
Lab Results  Component Value Date   LDLCALC 77 02/06/2022   Stable, pt to continue current statin crestor 40 qd

## 2022-10-06 NOTE — Progress Notes (Signed)
Patient ID: Warren Weeks, male   DOB: Dec 24, 1945, 77 y.o.   MRN: [id number]         Chief Complaint:: wellness exam and Results (Go over recent CT scan )  , peripheral edema, dm, urinary frequency and urgency, htn, hld,        HPI:  Warren Weeks is a 77 y.o. male here for wellness exam; declines covid booster, o/w up to date                        Also Denies urinary symptoms such as dysuria, flank pain, hematuria or n/v, fever, chills, but has had urinary frequency and urgency recently, has been trying to push more oral fluids, also has some increased leg swellling and wt gain.  Pt denies chest pain, increased sob or doe, wheezing, orthopnea, PND, palpitations, dizziness or syncope.  Pt denies polydipsia, polyuria, or new focal neuro s/s.    Pt denies fever, wt loss, night sweats, loss of appetite, or other constitutional symptoms  Good compliance with meds including the hct 25 qd.     Wt Readings from Last 3 Encounters:  10/06/22 248 lb (112.5 kg)  09/01/22 247 lb 4 oz (112.2 kg)  08/19/22 250 lb (113.4 kg)   BP Readings from Last 3 Encounters:  10/06/22 120/62  09/01/22 136/78  08/19/22 (!) 140/68   Immunization History  Administered Date(s) Administered   Fluad Quad(high Dose 65+) 02/21/2019, 03/12/2021   H1N1 04/22/2008   Influenza Whole 06/10/2009   Influenza, High Dose Seasonal PF 03/20/2016, 03/16/2017, 03/16/2018   Influenza,inj,Quad PF,6+ Mos 04/10/2015   Influenza-Unspecified 03/22/2012, 09/03/2014, 04/22/2020, 04/06/2022   PFIZER(Purple Top)SARS-COV-2 Vaccination 07/24/2019, 08/21/2019, 06/24/2020   Pneumococcal Conjugate-13 12/12/2013   Pneumococcal Polysaccharide-23 12/14/2014   RSV,unspecified 04/06/2022   Td 10/03/2008   Tdap 12/07/2018   Zoster Recombinat (Shingrix) 09/24/2017, 12/20/2017   Zoster, Live 10/12/2012  There are no preventive care reminders to display for this patient.    Past Medical History:  Diagnosis Date   ALLERGIC RHINITIS 09/16/2007    ALLERGY 03/24/2007   Allergy    ANXIETY 03/28/2007   Arthritis    knees, back,shoulder   CAROTID ARTERY STENOSIS, BILATERAL 07/01/2009   yearly checks - no current problems   CEPHALGIA 06/08/2009   CHEST PAIN 09/16/2007   no current problems   COLONIC POLYPS, HX OF 09/16/2007   COPD (chronic obstructive pulmonary disease) 05/12/2017   mild per patient - no inhaler   Dizziness and giddiness 06/08/2009   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   DYSPNEA/SHORTNESS OF BREATH 09/16/2007   ERECTILE DYSFUNCTION 03/28/2007   ESOPHAGEAL STRICTURE 09/16/2007   ESOPHAGITIS 03/24/2007   GERD 03/24/2007   GLUCOSE INTOLERANCE 09/16/2007   HEMORRHOIDS, INTERNAL 03/24/2007   HYPERCHOLESTEROLEMIA 03/24/2007   HYPERLIPIDEMIA 09/16/2007   HYPERTENSION 03/28/2007   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/2011   Impaired glucose tolerance 10/04/2010   LOW BACK PAIN 03/28/2007   MAGNETIC RESONANCE IMAGING, BRAIN, ABNORMAL 06/26/2009   NECK PAIN, LEFT 06/20/2010   OBESITY 03/24/2007   OTITIS MEDIA, LEFT 06/10/2009   Palpitations 06/20/2010   no current problems   PVD 06/25/2009   RASH-NONVESICULAR 06/17/2009   Sleep apnea 12/25/2021   URI 08/16/2009   Past Surgical History:  Procedure Laterality Date   COLONOSCOPY  03/2013   polyps/[place]   LEFT HEART CATH AND CORONARY ANGIOGRAPHY N/A 10/14/2021   Procedure: LEFT HEART CATH AND CORONARY ANGIOGRAPHY;  Surgeon: Corky Crafts, MD;  Location: [facility];  Service: Cardiovascular;  Laterality: N/A;   NASAL SEPTOPLASTY W/ TURBINOPLASTY Bilateral 01/03/2021   Procedure: NASAL SEPTOPLASTY WITH BILATERAL TURBINATE REDUCTION;  Surgeon: Newman Pies, MD;  Location: [facility];  Service: ENT;  Laterality: Bilateral;   s/p right knee arthroscopy     UPPER GASTROINTESTINAL ENDOSCOPY     gerd    reports that he quit smoking about 31 years ago. His smoking use included cigarettes. He has a 7.50 pack-year smoking history. He has never used smokeless tobacco. He reports current  alcohol use of about 7.0 standard drinks of alcohol per week. He reports that he does not use drugs. family history includes Coronary artery disease in his brother and sister; Heart disease in his father; Lung cancer in his sister; Lymphoma in his mother; Ovarian cancer in his sister. Allergies  Allergen Reactions   Cefuroxime Axetil Rash   Current Outpatient Medications on File Prior to Visit  Medication Sig Dispense Refill   acetaminophen (TYLENOL) 650 MG CR tablet Take 650 mg by mouth every 8 (eight) hours as needed for pain.     albuterol (VENTOLIN HFA) 108 (90 Base) MCG/ACT inhaler Inhale 1-2 puffs into the lungs every 6 (six) hours as needed for wheezing or shortness of breath. 8 g 0   aspirin 81 MG EC tablet Take 1 tablet (81 mg total) by mouth daily. Swallow whole. 30 tablet 12   Cyanocobalamin (B-12 PO) Take 1 tablet by mouth daily.     Cyanocobalamin POWD Take 1 tablet by mouth daily.     diclofenac Sodium (VOLTAREN) 1 % GEL Apply 2 g topically daily as needed (knee pain).     Diclofenac Sodium CR 100 MG 24 hr tablet Take 1 tablet by mouth once daily 90 tablet 1   ezetimibe (ZETIA) 10 MG tablet Take 1 tablet by mouth once daily 90 tablet 3   Ferrous Sulfate (IRON PO) Take 1 tablet by mouth daily.     fluticasone (FLONASE) 50 MCG/ACT nasal spray Use 2 spray(s) in each nostril once daily 16 g 0   fluticasone-salmeterol (ADVAIR) 250-50 MCG/ACT AEPB INHALE 1 DOSE BY MOUTH ONCE DAILY IN THE MORNING AND 1 ONCE DAILY IN THE EVENING 60 each 5   hydrochlorothiazide (HYDRODIURIL) 25 MG tablet Take 1 tablet by mouth once daily 90 tablet 3   HYDROcodone bit-homatropine (HYCODAN) 5-1.5 MG/5ML syrup Take 5 mLs by mouth every 6 (six) hours as needed for cough. 120 mL 0   ketorolac (ACULAR) 0.5 % ophthalmic solution      lisinopril (ZESTRIL) 20 MG tablet Take 1 tablet (20 mg total) by mouth daily. 90 tablet 3   Melatonin 10 MG TABS Take 10 mg by mouth at bedtime.      methocarbamol (ROBAXIN) 500 MG  tablet Take 1 tablet (500 mg total) by mouth 2 (two) times daily as needed. 60 tablet 2   metoprolol tartrate (LOPRESSOR) 50 MG tablet Take 1 tablet (50 mg total) by mouth 2 (two) times daily. 180 tablet 1   mometasone (NASONEX) 50 MCG/ACT nasal spray Place 2 sprays into the nose daily. 1 each 12   Multiple Vitamin (ONE-A-DAY 55 PLUS PO) Take 1 tablet by mouth daily.     nystatin (MYCOSTATIN) 100000 UNIT/ML suspension Take 5 mLs (500,000 Units total) by mouth 4 (four) times daily. 120 mL 1   ofloxacin (OCUFLOX) 0.3 % ophthalmic solution      Omega-3 Fatty Acids (FISH OIL) 1000 MG CAPS Take 1,000 mg by mouth daily.  omeprazole (PRILOSEC) 20 MG capsule Take 1 capsule by mouth twice daily 180 capsule 3   prednisoLONE acetate (PRED FORTE) 1 % ophthalmic suspension      predniSONE (DELTASONE) 20 MG tablet Take 2 tablets (40 mg total) by mouth daily. 10 tablet 0   rosuvastatin (CRESTOR) 40 MG tablet Take 1 tablet by mouth once daily 90 tablet 3   sildenafil (VIAGRA) 100 MG tablet Take 1 tablet (100 mg total) by mouth as needed for erectile dysfunction. 10 tablet 11   solifenacin (VESICARE) 5 MG tablet Take 1 tablet (5 mg total) by mouth daily. (Patient taking differently: Take 5 mg by mouth daily as needed (bladder).) 90 tablet 3   tadalafil (CIALIS) 20 MG tablet Take 20 mg by mouth as needed.     terazosin (HYTRIN) 5 MG capsule Take 5 mg by mouth at bedtime.      Testosterone 20.25 MG/1.25GM (1.62%) GEL Apply 2 Packages topically daily.     thiamine 250 MG tablet Take 250 mg by mouth daily.     traZODone (DESYREL) 100 MG tablet Take 1 tablet (100 mg total) by mouth at bedtime as needed for sleep. 90 tablet 1   triamcinolone cream (KENALOG) 0.5 % Apply 1 application topically 3 (three) times daily. 30 g 1   Turmeric 500 MG TABS Take 500 mg by mouth daily.     albuterol (VENTOLIN HFA) 108 (90 Base) MCG/ACT inhaler Inhale 2 puffs into the lungs every 6 (six) hours as needed for wheezing or shortness  of breath. (Patient not taking: Reported on 08/19/2022) 8 g 0   No current facility-administered medications on file prior to visit.        ROS:  All others reviewed and negative.  Objective        PE:  BP 120/62 (BP Location: Right Arm, Patient Position: Sitting, Cuff Size: Normal)   Pulse 60   Temp 97.8 F (36.6 C) (Oral)   Ht 5\' 9"  (1.753 m)   Wt 248 lb (112.5 kg)   SpO2 97%   BMI 36.62 kg/m                 Constitutional: Pt appears in NAD               HENT: Head: NCAT.                Right Ear: External ear normal.                 Left Ear: External ear normal.                Eyes: . Pupils are equal, round, and reactive to light. Conjunctivae and EOM are normal               Nose: without d/c or deformity               Neck: Neck supple. Gross normal ROM               Cardiovascular: Normal rate and regular rhythm.                 Pulmonary/Chest: Effort normal and breath sounds without rales or wheezing.                Abd:  Soft, NT, ND, + BS, no organomegaly               Neurological: Pt is alert. At baseline orientation, motor grossly intact  Skin: Skin is warm. No rashes, no other new lesions, LE edema - none               Psychiatric: Pt behavior is normal without agitation   Micro: none  Cardiac tracings I have personally interpreted today:  none  Pertinent Radiological findings (summarize): none   Lab Results  Component Value Date   WBC 7.0 06/22/2022   HGB 14.1 06/22/2022   HCT 42.9 06/22/2022   PLT 123 (L) 06/22/2022   GLUCOSE 112 (H) 06/22/2022   CHOL 160 02/06/2022   TRIG 65.0 02/06/2022   HDL 70.30 02/06/2022   LDLDIRECT 176.0 04/03/2011   LDLCALC 77 02/06/2022   ALT 36 02/06/2022   AST 31 02/06/2022   NA 141 06/22/2022   K 3.5 06/22/2022   CL 107 06/22/2022   CREATININE 1.01 06/22/2022   BUN 10 06/22/2022   CO2 24 06/22/2022   TSH 1.55 09/17/2021   PSA 0.10 09/17/2021   HGBA1C 6.6 (H) 02/06/2022   MICROALBUR <0.7  09/14/2019   Assessment/Plan:  Elek Holderness Flinchum is a 77 y.o. Black or African American [2] male with  has a past medical history of ALLERGIC RHINITIS (09/16/2007), ALLERGY (03/24/2007), Allergy, ANXIETY (03/28/2007), Arthritis, CAROTID ARTERY STENOSIS, BILATERAL (07/01/2009), CEPHALGIA (06/08/2009), CHEST PAIN (09/16/2007), COLONIC POLYPS, HX OF (09/16/2007), COPD (chronic obstructive pulmonary disease) (05/12/2017), Dizziness and giddiness (06/08/2009), Dyshidrotic eczema (06/03/2018), DYSPNEA/SHORTNESS OF BREATH (09/16/2007), ERECTILE DYSFUNCTION (03/28/2007), ESOPHAGEAL STRICTURE (09/16/2007), ESOPHAGITIS (03/24/2007), GERD (03/24/2007), GLUCOSE INTOLERANCE (09/16/2007), HEMORRHOIDS, INTERNAL (03/24/2007), HYPERCHOLESTEROLEMIA (03/24/2007), HYPERLIPIDEMIA (09/16/2007), HYPERTENSION (03/28/2007), Hypogonadism male (10/09/2011), Impaired glucose tolerance (10/04/2010), LOW BACK PAIN (03/28/2007), MAGNETIC RESONANCE IMAGING, BRAIN, ABNORMAL (06/26/2009), NECK PAIN, LEFT (06/20/2010), OBESITY (03/24/2007), OTITIS MEDIA, LEFT (06/10/2009), Palpitations (06/20/2010), PVD (06/25/2009), RASH-NONVESICULAR (06/17/2009), Sleep apnea (12/25/2021), and URI (08/16/2009).  Encounter for well adult exam with abnormal findings Age and sex appropriate education and counseling updated with regular exercise and diet Referrals for preventative services - none needed Immunizations addressed - declines covid booster Smoking counseling  - none needed Evidence for depression or other mood disorder - none significant Most recent labs reviewed. I have personally reviewed and have noted: 1) the patient's medical and social history 2) The patient's current medications and supplements 3) The patient's height, weight, and BMI have been recorded in the chart   Essential hypertension BP Readings from Last 3 Encounters:  10/06/22 120/62  09/01/22 136/78  08/19/22 (!) 140/68   Stable, pt to continue medical treatment hct 25 qd, lisionpril 20 qd,  lopressor 50 bid   Hyperlipidemia Lab Results  Component Value Date   LDLCALC 77 02/06/2022   Stable, pt to continue current statin crestor 40 qd   Impaired glucose tolerance Lab Results  Component Value Date   HGBA1C 6.6 (H) 02/06/2022   Stable, pt to continue current medical treatment  - diet, wt control   Peripheral edema I suspect related to increased po fluids in the setting of gr 2 diast dyfunction per echo 2019 - cont hct 25 qd for now, and moderate the oral fluids,  to f/u any worsening symptoms or concerns  Urinary frequency Also for ua and culture, suspect related to possible bph and/or OAB , declines any further med tx change for now  Followup: Return in about 6 months (around 04/07/2023).  Oliver Barre, MD 10/06/2022 9:18 PM [facility]

## 2022-10-06 NOTE — Assessment & Plan Note (Signed)
Lab Results  Component Value Date   HGBA1C 6.6 (H) 02/06/2022   Stable, pt to continue current medical treatment  - diet, wt control

## 2022-10-06 NOTE — Assessment & Plan Note (Signed)
I suspect related to increased po fluids in the setting of gr 2 diast dyfunction per echo 2019 - cont hct 25 qd for now, and moderate the oral fluids,  to f/u any worsening symptoms or concerns

## 2022-10-06 NOTE — Assessment & Plan Note (Signed)

## 2022-10-07 LAB — CBC WITH DIFFERENTIAL/PLATELET
Basophils Absolute: 0.1 10*3/uL (ref 0.0–0.1)
Basophils Relative: 0.7 % (ref 0.0–3.0)
Eosinophils Absolute: 0.1 10*3/uL (ref 0.0–0.7)
Eosinophils Relative: 1.2 % (ref 0.0–5.0)
HCT: 39.5 % (ref 39.0–52.0)
Hemoglobin: 13.2 g/dL (ref 13.0–17.0)
Lymphocytes Relative: 16.7 % (ref 12.0–46.0)
Lymphs Abs: 1.2 10*3/uL (ref 0.7–4.0)
MCHC: 33.5 g/dL (ref 30.0–36.0)
MCV: 91.6 fl (ref 78.0–100.0)
Monocytes Absolute: 0.5 10*3/uL (ref 0.1–1.0)
Monocytes Relative: 6.7 % (ref 3.0–12.0)
Neutro Abs: 5.3 10*3/uL (ref 1.4–7.7)
Neutrophils Relative %: 74.7 % (ref 43.0–77.0)
Platelets: 151 10*3/uL (ref 150.0–400.0)
RBC: 4.31 Mil/uL (ref 4.22–5.81)
RDW: 14.8 % (ref 11.5–15.5)
WBC: 7.1 10*3/uL (ref 4.0–10.5)

## 2022-10-07 LAB — URINALYSIS, ROUTINE W REFLEX MICROSCOPIC
Bilirubin Urine: NEGATIVE
Hgb urine dipstick: NEGATIVE
Leukocytes,Ua: NEGATIVE
Nitrite: NEGATIVE
RBC / HPF: NONE SEEN (ref 0–?)
Specific Gravity, Urine: 1.015 (ref 1.000–1.030)
Total Protein, Urine: NEGATIVE
Urine Glucose: NEGATIVE
Urobilinogen, UA: 0.2 (ref 0.0–1.0)
pH: 5.5 (ref 5.0–8.0)

## 2022-10-07 LAB — VITAMIN D 25 HYDROXY (VIT D DEFICIENCY, FRACTURES): VITD: 32.35 ng/mL (ref 30.00–100.00)

## 2022-10-07 LAB — BASIC METABOLIC PANEL
BUN: 20 mg/dL (ref 6–23)
CO2: 27 mEq/L (ref 19–32)
Calcium: 9.3 mg/dL (ref 8.4–10.5)
Chloride: 107 mEq/L (ref 96–112)
Creatinine, Ser: 1.29 mg/dL (ref 0.40–1.50)
GFR: 54.68 mL/min — ABNORMAL LOW (ref >60.00)
Glucose, Bld: 104 mg/dL — ABNORMAL HIGH (ref 70–99)
Potassium: 5.1 mEq/L (ref 3.5–5.1)
Sodium: 142 mEq/L (ref 135–145)

## 2022-10-07 LAB — URINE CULTURE: Result:: NO GROWTH

## 2022-10-07 LAB — VITAMIN B12: Vitamin B-12: 1060 pg/mL — ABNORMAL HIGH (ref 211–911)

## 2022-10-07 LAB — MICROALBUMIN / CREATININE URINE RATIO
Creatinine,U: 128.2 mg/dL
Microalb Creat Ratio: 0.5 mg/g (ref 0.0–30.0)
Microalb, Ur: <0.7 mg/dL (ref 0.0–1.9)

## 2022-10-07 LAB — HEPATIC FUNCTION PANEL
ALT: 52 U/L (ref 0–53)
AST: 37 U/L (ref 0–37)
Albumin: 4.1 g/dL (ref 3.5–5.2)
Alkaline Phosphatase: 69 U/L (ref 39–117)
Bilirubin, Direct: 0.1 mg/dL (ref 0.0–0.3)
Total Bilirubin: 0.5 mg/dL (ref 0.2–1.2)
Total Protein: 7 g/dL (ref 6.0–8.3)

## 2022-10-07 LAB — LIPID PANEL
Cholesterol: 147 mg/dL (ref 0–200)
HDL: 60.2 mg/dL (ref >39.00)
LDL Cholesterol: 69 mg/dL (ref 0–99)
NonHDL: 86.35
Total CHOL/HDL Ratio: 2
Triglycerides: 87 mg/dL (ref 0.0–149.0)
VLDL: 17.4 mg/dL (ref 0.0–40.0)

## 2022-10-07 LAB — TSH: TSH: 1.26 u[IU]/mL (ref 0.35–5.50)

## 2022-10-07 LAB — HEMOGLOBIN A1C: Hgb A1c MFr Bld: 6.6 % — ABNORMAL HIGH (ref 4.6–6.5)

## 2022-10-07 NOTE — Progress Notes (Signed)
The test results show that your current treatment is OK, as the tests are stable.  Please continue the same plan.  There is no other need for change of treatment or further evaluation based on these results, at this time.  thanks 

## 2022-10-08 ENCOUNTER — Other Ambulatory Visit: Payer: Self-pay | Admitting: Internal Medicine

## 2022-10-14 ENCOUNTER — Other Ambulatory Visit: Payer: Self-pay

## 2022-10-14 NOTE — Telephone Encounter (Signed)
Patient is out of this medication

## 2022-10-28 DIAGNOSIS — G4733 Obstructive sleep apnea (adult) (pediatric): Secondary | ICD-10-CM | POA: Diagnosis not present

## 2022-11-02 ENCOUNTER — Ambulatory Visit: Payer: Medicare PPO | Admitting: Pulmonary Disease

## 2022-11-17 DIAGNOSIS — Z961 Presence of intraocular lens: Secondary | ICD-10-CM | POA: Diagnosis not present

## 2022-11-25 ENCOUNTER — Ambulatory Visit (INDEPENDENT_AMBULATORY_CARE_PROVIDER_SITE_OTHER): Payer: Medicare PPO | Admitting: Pulmonary Disease

## 2022-11-25 DIAGNOSIS — J449 Chronic obstructive pulmonary disease, unspecified: Secondary | ICD-10-CM | POA: Diagnosis not present

## 2022-11-25 LAB — PULMONARY FUNCTION TEST
DL/VA % pred: 92 %
DL/VA: 3.68 ml/min/mmHg/L
DLCO cor % pred: 78 %
DLCO cor: 19.21 ml/min/mmHg
DLCO unc % pred: 78 %
DLCO unc: 19.21 ml/min/mmHg
FEF 25-75 Post: 4.51 L/sec
FEF 25-75 Pre: 4.67 L/sec
FEF2575-%Change-Post: -3 %
FEF2575-%Pred-Post: 206 %
FEF2575-%Pred-Pre: 214 %
FEV1-%Change-Post: -3 %
FEV1-%Pred-Post: 100 %
FEV1-%Pred-Pre: 103 %
FEV1-Post: 2.99 L
FEV1-Pre: 3.09 L
FEV1FVC-%Change-Post: -2 %
FEV1FVC-%Pred-Pre: 121 %
FEV6-%Change-Post: 0 %
FEV6-%Pred-Post: 89 %
FEV6-%Pred-Pre: 89 %
FEV6-Post: 3.45 L
FEV6-Pre: 3.46 L
FEV6FVC-%Change-Post: 0 %
FEV6FVC-%Pred-Post: 106 %
FEV6FVC-%Pred-Pre: 105 %
FVC-%Change-Post: 0 %
FVC-%Pred-Post: 84 %
FVC-%Pred-Pre: 84 %
FVC-Post: 3.46 L
FVC-Pre: 3.49 L
Post FEV1/FVC ratio: 87 %
Post FEV6/FVC ratio: 100 %
Pre FEV1/FVC ratio: 89 %
Pre FEV6/FVC Ratio: 99 %
RV % pred: 82 %
RV: 2.05 L
TLC % pred: 83 %
TLC: 5.69 L

## 2022-11-25 NOTE — Progress Notes (Signed)
Full PFT completed today ? ?

## 2022-11-26 ENCOUNTER — Other Ambulatory Visit: Payer: Self-pay

## 2022-11-26 ENCOUNTER — Other Ambulatory Visit: Payer: Self-pay | Admitting: Internal Medicine

## 2022-11-28 DIAGNOSIS — G4733 Obstructive sleep apnea (adult) (pediatric): Secondary | ICD-10-CM | POA: Diagnosis not present

## 2022-12-03 ENCOUNTER — Encounter (HOSPITAL_BASED_OUTPATIENT_CLINIC_OR_DEPARTMENT_OTHER): Payer: Medicare PPO

## 2022-12-04 ENCOUNTER — Ambulatory Visit: Payer: Medicare PPO | Admitting: Nurse Practitioner

## 2022-12-09 ENCOUNTER — Encounter (HOSPITAL_BASED_OUTPATIENT_CLINIC_OR_DEPARTMENT_OTHER): Payer: Medicare PPO

## 2022-12-09 ENCOUNTER — Encounter: Payer: Self-pay | Admitting: Nurse Practitioner

## 2022-12-09 ENCOUNTER — Ambulatory Visit: Payer: Medicare PPO | Admitting: Nurse Practitioner

## 2022-12-09 VITALS — BP 126/60 | HR 69 | Temp 98.6°F | Ht 69.0 in | Wt 246.2 lb

## 2022-12-09 DIAGNOSIS — J4530 Mild persistent asthma, uncomplicated: Secondary | ICD-10-CM

## 2022-12-09 DIAGNOSIS — G4733 Obstructive sleep apnea (adult) (pediatric): Secondary | ICD-10-CM

## 2022-12-09 DIAGNOSIS — R600 Localized edema: Secondary | ICD-10-CM | POA: Diagnosis not present

## 2022-12-09 DIAGNOSIS — R0609 Other forms of dyspnea: Secondary | ICD-10-CM

## 2022-12-09 DIAGNOSIS — I272 Pulmonary hypertension, unspecified: Secondary | ICD-10-CM

## 2022-12-09 NOTE — Patient Instructions (Addendum)
Continue Advair 1 puff Twice daily. Brush tongue and rinse mouth afterwards  Continue Albuterol inhaler 2 puffs every 6 hours as needed for shortness of breath or wheezing. Notify if symptoms persist despite rescue inhaler/neb use.  Continue BiPAP nightly. Adjust settings to auto set IPAP max 16, EPAP min 10 and PS 4.   Use saline nasal gel before bed   Echocardiogram ordered  Labs today  Follow up in 6 weeks to review echo and changes on BiPAP with Dr. Wynona Neat or Katie Ozzie Knobel,NP. If symptoms do not improve or worsen, please contact office for sooner follow up or seek emergency care.

## 2022-12-09 NOTE — Progress Notes (Signed)
@Patient  ID: Warren Weeks, male    DOB: 07/08/1945, 77 y.o.   MRN: [id number]  Chief Complaint  Patient presents with   Follow-up    Ct and pft review     Referring provider: Corwin Levins, MD  HPI: 77 year old male, former smoker followed for DOE and OSA. He is a patient of Dr. Trena Platt and last seen in office 08/19/2022. Past medical history significant for HTN, carotid artery stenosis, PVD, allergic rhinitis, GERD, BPH, anxiety, ED, HLD, diastolic dysfunction.   TEST/EVENTS:  10/22/2021 echo: EF 55 to 60%.  G1 DD.  RV size and function is normal.  Mild PAH.  LA and RA moderately dilated.  Trivial MR. 12/10/2021 HST: AHI 27.3/h, SpO2 low 68% 12/16/2021 PFT: FVC 82, FEV1 97, ratio 87, TLC 84, DLCO 79 09/30/2022 CT chest without contrast: Atherosclerosis/CAD.  Pulmonary trunk normal in caliber.  Mild centrilobular emphysematous changes.  Mild bronchiectasis and atelectasis in the right lower lobe.  Scarring/fibrosis in left lingula, unchanged compared to prior. 11/25/2022 PFT: FVC 84, FEV1 103, ratio 87, TLC 83, DLCO 78.  No BD 03/05/2022 BiPAP titration >> 20/16 cmH2O  08/19/2022: OV with Dr. Wynona Neat. Hx of OSA, not using BiPAP. Had an illness in December; afterwards, unable to tolerate his machine. Has some difficulty with mask and pressure. Feels leaks a lot. Trying to use it was keeping him up more. Uses advair twice a day. DOE multifactorial related to emphysema, diastolic dysfunction, mild PH. Educated on the importance of treating sleep disordered breathing. Advised to resume biPAP - adjustments made to pressure 16/12. Repeat CT chest in April. Needs PFTs.   12/09/2022: Today - follow up Patient presents today for follow up to discuss CT scan results and PFTs. CT from April revealed stable scarring/fibrosis in the LUL as well as emphysema. No suspicious nodules. His PFTs were normal and overall stable. He did have a very slight diffusion defect which corrected to normal for alveolar volume  and relatively similar compared to previous testing. His last echo a year ago showed diastolic dysfunction and mild PH. His breathing feels unchanged compared to when he was here last. He still gets short winded, especially with any sort of distance walking. He has an occasional cough, which is dry and does not bother him. He does have some swelling in his legs, which seems to be increased over the past few months. He denies any orthopnea, palpitations, lightheadedness/dizziness, syncope, CP, wheezing, chest congestion, hemoptysis. He uses his advair twice a day, which does help. Does not use his rescue. Wearing BiPAP again but still having some trouble with dry mouth and pressures.   Allergies  Allergen Reactions   Cefuroxime Axetil Rash    Immunization History  Administered Date(s) Administered   Fluad Quad(high Dose 65+) 02/21/2019, 03/12/2021   H1N1 04/22/2008   Influenza Whole 06/10/2009   Influenza, High Dose Seasonal PF 03/20/2016, 03/16/2017, 03/16/2018   Influenza,inj,Quad PF,6+ Mos 04/10/2015   Influenza-Unspecified 03/22/2012, 09/03/2014, 04/22/2020, 04/06/2022   PFIZER(Purple Top)SARS-COV-2 Vaccination 07/24/2019, 08/21/2019, 06/24/2020   Pneumococcal Conjugate-13 12/12/2013   Pneumococcal Polysaccharide-23 12/14/2014   RSV,unspecified 04/06/2022   Td 10/03/2008   Tdap 12/07/2018   Zoster Recombinat (Shingrix) 09/24/2017, 12/20/2017   Zoster, Live 10/12/2012    Past Medical History:  Diagnosis Date   ALLERGIC RHINITIS 09/16/2007   ALLERGY 03/24/2007   Allergy    ANXIETY 03/28/2007   Arthritis    knees, back,shoulder   CAROTID ARTERY STENOSIS, BILATERAL 07/01/2009   yearly checks -  no current problems   CEPHALGIA 06/08/2009   CHEST PAIN 09/16/2007   no current problems   COLONIC POLYPS, HX OF 09/16/2007   COPD (chronic obstructive pulmonary disease) (HCC) 05/12/2017   mild per patient - no inhaler   Dizziness and giddiness 06/08/2009   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    DYSPNEA/SHORTNESS OF BREATH 09/16/2007   ERECTILE DYSFUNCTION 03/28/2007   ESOPHAGEAL STRICTURE 09/16/2007   ESOPHAGITIS 03/24/2007   GERD 03/24/2007   GLUCOSE INTOLERANCE 09/16/2007   HEMORRHOIDS, INTERNAL 03/24/2007   HYPERCHOLESTEROLEMIA 03/24/2007   HYPERLIPIDEMIA 09/16/2007   HYPERTENSION 03/28/2007   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/2011   Impaired glucose tolerance 10/04/2010   LOW BACK PAIN 03/28/2007   MAGNETIC RESONANCE IMAGING, BRAIN, ABNORMAL 06/26/2009   NECK PAIN, LEFT 06/20/2010   OBESITY 03/24/2007   OTITIS MEDIA, LEFT 06/10/2009   Palpitations 06/20/2010   no current problems   PVD 06/25/2009   RASH-NONVESICULAR 06/17/2009   Sleep apnea 12/25/2021   URI 08/16/2009    Tobacco History: Social History   Tobacco Use  Smoking Status Former   Packs/day: 0.50   Years: 15.00   Additional pack years: 0.00   Total pack years: 7.50   Types: Cigarettes   Quit date: 06/23/1991   Years since quitting: 31.5  Smokeless Tobacco Never  Tobacco Comments   Quit 30 years ago   Counseling given: Not Answered Tobacco comments: Quit 30 years ago   Outpatient Medications Prior to Visit  Medication Sig Dispense Refill   acetaminophen (TYLENOL) 650 MG CR tablet Take 650 mg by mouth every 8 (eight) hours as needed for pain.     aspirin 81 MG EC tablet Take 1 tablet (81 mg total) by mouth daily. Swallow whole. 30 tablet 12   Cyanocobalamin (B-12 PO) Take 1 tablet by mouth daily.     Cyanocobalamin POWD Take 1 tablet by mouth daily.     diclofenac Sodium (VOLTAREN) 1 % GEL Apply 2 g topically daily as needed (knee pain).     Diclofenac Sodium CR 100 MG 24 hr tablet Take 1 tablet by mouth once daily 90 tablet 1   ezetimibe (ZETIA) 10 MG tablet Take 1 tablet by mouth once daily 90 tablet 3   Ferrous Sulfate (IRON PO) Take 1 tablet by mouth daily.     fluticasone (FLONASE) 50 MCG/ACT nasal spray Use 2 spray(s) in each nostril once daily 16 g 0   fluticasone-salmeterol (ADVAIR) 250-50 MCG/ACT AEPB INHALE 1  DOSE BY MOUTH ONCE DAILY IN THE MORNING AND 1 ONCE DAILY IN THE EVENING 60 each 5   hydrochlorothiazide (HYDRODIURIL) 25 MG tablet Take 1 tablet by mouth once daily 90 tablet 3   HYDROcodone bit-homatropine (HYCODAN) 5-1.5 MG/5ML syrup Take 5 mLs by mouth every 6 (six) hours as needed for cough. 120 mL 0   ketorolac (ACULAR) 0.5 % ophthalmic solution      lisinopril (ZESTRIL) 20 MG tablet Take 1 tablet (20 mg total) by mouth daily. 90 tablet 3   Melatonin 10 MG TABS Take 10 mg by mouth at bedtime.      methocarbamol (ROBAXIN) 500 MG tablet Take 1 tablet (500 mg total) by mouth 2 (two) times daily as needed. 60 tablet 2   metoprolol tartrate (LOPRESSOR) 50 MG tablet Take 1 tablet (50 mg total) by mouth 2 (two) times daily. 180 tablet 1   Multiple Vitamin (ONE-A-DAY 55 PLUS PO) Take 1 tablet by mouth daily.     nystatin (MYCOSTATIN) 100000 UNIT/ML suspension Take 5 mLs (500,000  Units total) by mouth 4 (four) times daily. 120 mL 1   ofloxacin (OCUFLOX) 0.3 % ophthalmic solution      Omega-3 Fatty Acids (FISH OIL) 1000 MG CAPS Take 1,000 mg by mouth daily.     omeprazole (PRILOSEC) 20 MG capsule Take 1 capsule by mouth twice daily 180 capsule 0   prednisoLONE acetate (PRED FORTE) 1 % ophthalmic suspension      rosuvastatin (CRESTOR) 40 MG tablet Take 1 tablet by mouth once daily 90 tablet 3   sildenafil (VIAGRA) 100 MG tablet Take 1 tablet (100 mg total) by mouth as needed for erectile dysfunction. 10 tablet 11   solifenacin (VESICARE) 5 MG tablet Take 1 tablet (5 mg total) by mouth daily. (Patient taking differently: Take 5 mg by mouth daily as needed (bladder).) 90 tablet 3   tadalafil (CIALIS) 20 MG tablet Take 20 mg by mouth as needed.     terazosin (HYTRIN) 5 MG capsule Take 5 mg by mouth at bedtime.      Testosterone 20.25 MG/1.25GM (1.62%) GEL Apply 2 Packages topically daily.     thiamine 250 MG tablet Take 250 mg by mouth daily.     traZODone (DESYREL) 100 MG tablet Take 1 tablet (100 mg  total) by mouth at bedtime as needed for sleep. 90 tablet 1   triamcinolone cream (KENALOG) 0.5 % Apply 1 application topically 3 (three) times daily. 30 g 1   Turmeric 500 MG TABS Take 500 mg by mouth daily.     albuterol (VENTOLIN HFA) 108 (90 Base) MCG/ACT inhaler Inhale 2 puffs into the lungs every 6 (six) hours as needed for wheezing or shortness of breath. (Patient not taking: Reported on 08/19/2022) 8 g 0   albuterol (VENTOLIN HFA) 108 (90 Base) MCG/ACT inhaler Inhale 1-2 puffs into the lungs every 6 (six) hours as needed for wheezing or shortness of breath. (Patient not taking: Reported on 12/09/2022) 8 g 0   mometasone (NASONEX) 50 MCG/ACT nasal spray Place 2 sprays into the nose daily. (Patient not taking: Reported on 12/09/2022) 1 each 12   predniSONE (DELTASONE) 20 MG tablet Take 2 tablets (40 mg total) by mouth daily. (Patient not taking: Reported on 12/09/2022) 10 tablet 0   No facility-administered medications prior to visit.     Review of Systems:   Constitutional: No weight loss or gain, night sweats, fevers, chills, fatigue, or lassitude. HEENT: No headaches, difficulty swallowing, tooth/dental problems, or sore throat. No sneezing, itching, ear ache, nasal congestion, or post nasal drip CV:  +swelling in lower extremities. No chest pain, orthopnea, PND, anasarca, dizziness, palpitations, syncope Resp: +shortness of breath with exertion; occasional dry cough. No excess mucus or change in color of mucus. No hemoptysis. No wheezing.  No chest wall deformity GI:  No heartburn, indigestion GU: No dysuria, change in color of urine, urgency or frequency.   Skin: No rash, lesions, ulcerations MSK:  No joint pain or swelling.   Neuro: No dizziness or lightheadedness.  Psych: No depression or anxiety. Mood stable.     Physical Exam:  BP 126/60   Pulse 69   Temp 98.6 F (37 C) (Oral)   Ht 5\' 9"  (1.753 m)   Wt 246 lb 3.2 oz (111.7 kg)   SpO2 97%   BMI 36.36 kg/m   GEN:  Pleasant, interactive, well-appearing; obese; in no acute distress. HEENT:  Normocephalic and atraumatic. PERRLA. Sclera white. Nasal turbinates pink, moist and patent bilaterally. No rhinorrhea present. Oropharynx pink and moist,  without exudate or edema. No lesions, ulcerations, or postnasal drip.  NECK:  Supple w/ fair ROM. No JVD present. Normal carotid impulses w/o bruits. Thyroid symmetrical with no goiter or nodules palpated. No lymphadenopathy.   CV: RRR, no m/r/g, +1 BLE edema. Pulses intact, +2 bilaterally. No cyanosis, pallor or clubbing. PULMONARY:  Unlabored, regular breathing. Clear bilaterally A&P w/o wheezes/rales/rhonchi. No accessory muscle use.  GI: BS present and normoactive. Soft, non-tender to palpation. No organomegaly or masses detected.  MSK: No erythema, warmth or tenderness. Cap refil <2 sec all extrem. No deformities or joint swelling noted.  Neuro: A/Ox3. No focal deficits noted.   Skin: Warm, no lesions or rashe Psych: Normal affect and behavior. Judgement and thought content appropriate.     Lab Results:  CBC    Component Value Date/Time   WBC 7.1 10/06/2022 1639   RBC 4.31 10/06/2022 1639   HGB 13.2 10/06/2022 1639   HCT 39.5 10/06/2022 1639   PLT 151.0 10/06/2022 1639   MCV 91.6 10/06/2022 1639   MCH 30.6 06/22/2022 1402   MCHC 33.5 10/06/2022 1639   RDW 14.8 10/06/2022 1639   LYMPHSABS 1.2 10/06/2022 1639   MONOABS 0.5 10/06/2022 1639   EOSABS 0.1 10/06/2022 1639   BASOSABS 0.1 10/06/2022 1639    BMET    Component Value Date/Time   NA 141 12/09/2022 1602   K 4.8 12/09/2022 1602   CL 107 12/09/2022 1602   CO2 26 12/09/2022 1602   GLUCOSE 149 (H) 12/09/2022 1602   BUN 19 12/09/2022 1602   CREATININE 1.19 12/09/2022 1602   CALCIUM 8.9 12/09/2022 1602   GFRNONAA >60 06/22/2022 1402   GFRAA >60 02/14/2020 1720    BNP    Component Value Date/Time   BNP 160.4 (H) 06/22/2022 1401     Imaging:  No results found.       Latest Ref  Rng & Units 11/25/2022    9:58 AM 12/16/2021    9:03 AM  PFT Results  FVC-Pre L 3.49  3.41   FVC-Predicted Pre % 84  82   FVC-Post L 3.46  3.47   FVC-Predicted Post % 84  83   Pre FEV1/FVC % % 89  86   Post FEV1/FCV % % 87  87   FEV1-Pre L 3.09  2.94   FEV1-Predicted Pre % 103  97   FEV1-Post L 2.99  3.00   DLCO uncorrected ml/min/mmHg 19.21  19.65   DLCO UNC% % 78  79   DLCO corrected ml/min/mmHg 19.21  20.64   DLCO COR %Predicted % 78  83   DLVA Predicted % 92  102   TLC L 5.69  5.79   TLC % Predicted % 83  84   RV % Predicted % 82  94     No results found for: "NITRICOXIDE"      Assessment & Plan:   DOE (dyspnea on exertion) Multifactorial. His PFTs were overall normal. No formal obstruction or restriction. He does receive benefit from use of advair so would suspect a component of underlying asthma. He did have a diffusion defect but corrects to normal for alveolar volume. Question degree of PH? We will obtain repeat echo given worsening BLE edema. May need to consider RHC for further evaluation. Encouraged to work on graded exercises.   Patient Instructions  Continue Advair 1 puff Twice daily. Brush tongue and rinse mouth afterwards  Continue Albuterol inhaler 2 puffs every 6 hours as needed for shortness of breath or  wheezing. Notify if symptoms persist despite rescue inhaler/neb use.  Continue BiPAP nightly. Adjust settings to auto set IPAP max 16, EPAP min 10 and PS 4.   Use saline nasal gel before bed   Echocardiogram ordered  Labs today  Follow up in 6 weeks to review echo and changes on BiPAP with Dr. Wynona Neat or Katie Azia Toutant,NP. If symptoms do not improve or worsen, please contact office for sooner follow up or seek emergency care.    Asthma Compensated on current regimen. No acute exacerbation. Lung function nl on testing. He will continue ICS/LABA. Action plan in place.   Sleep apnea OSA on BiPAP.    I spent *** minutes of dedicated to the care of this  patient on the date of this encounter to include pre-visit review of records, face-to-face time with the patient discussing conditions above, post visit ordering of testing, clinical documentation with the electronic health record, making appropriate referrals as documented, and communicating necessary findings to members of the patients care team.  Noemi Chapel, NP 12/16/2022  Pt aware and understands NP's role.

## 2022-12-10 LAB — BASIC METABOLIC PANEL
BUN: 19 mg/dL (ref 6–23)
CO2: 26 mEq/L (ref 19–32)
Calcium: 8.9 mg/dL (ref 8.4–10.5)
Chloride: 107 mEq/L (ref 96–112)
Creatinine, Ser: 1.19 mg/dL (ref 0.40–1.50)
GFR: 60.17 mL/min (ref >60.00)
Glucose, Bld: 149 mg/dL — ABNORMAL HIGH (ref 70–99)
Potassium: 4.8 mEq/L (ref 3.5–5.1)
Sodium: 141 mEq/L (ref 135–145)

## 2022-12-10 LAB — BRAIN NATRIURETIC PEPTIDE: Pro B Natriuretic peptide (BNP): 260 pg/mL — ABNORMAL HIGH (ref 0.0–100.0)

## 2022-12-16 DIAGNOSIS — J45909 Unspecified asthma, uncomplicated: Secondary | ICD-10-CM | POA: Insufficient documentation

## 2022-12-16 NOTE — Assessment & Plan Note (Signed)
Compensated on current regimen. No acute exacerbation. Lung function nl on testing. He will continue ICS/LABA. Action plan in place.

## 2022-12-16 NOTE — Assessment & Plan Note (Signed)
Multifactorial. His PFTs were overall normal. No formal obstruction or restriction. He does receive benefit from use of advair so would suspect a component of underlying asthma. He did have a diffusion defect but corrects to normal for alveolar volume. Question degree of PH? We will obtain repeat echo given worsening BLE edema. May need to consider RHC for further evaluation. Encouraged to work on graded exercises.   Patient Instructions  Continue Advair 1 puff Twice daily. Brush tongue and rinse mouth afterwards  Continue Albuterol inhaler 2 puffs every 6 hours as needed for shortness of breath or wheezing. Notify if symptoms persist despite rescue inhaler/neb use.  Continue BiPAP nightly. Adjust settings to auto set IPAP max 16, EPAP min 10 and PS 4.   Use saline nasal gel before bed   Echocardiogram ordered  Labs today  Follow up in 6 weeks to review echo and changes on BiPAP with Dr. Wynona Neat or Katie Yunis Voorheis,NP. If symptoms do not improve or worsen, please contact office for sooner follow up or seek emergency care.

## 2022-12-16 NOTE — Assessment & Plan Note (Addendum)
Moderate OSA on BiPAP. Excellent compliance and control. Receives benefit from use. Aware of safe driving practices.

## 2022-12-17 ENCOUNTER — Encounter: Payer: Self-pay | Admitting: Nurse Practitioner

## 2022-12-28 DIAGNOSIS — G4733 Obstructive sleep apnea (adult) (pediatric): Secondary | ICD-10-CM | POA: Diagnosis not present

## 2022-12-31 ENCOUNTER — Encounter: Payer: Self-pay | Admitting: Internal Medicine

## 2023-01-02 DIAGNOSIS — G4733 Obstructive sleep apnea (adult) (pediatric): Secondary | ICD-10-CM | POA: Diagnosis not present

## 2023-01-13 ENCOUNTER — Ambulatory Visit (HOSPITAL_COMMUNITY): Payer: Medicare PPO | Attending: Cardiovascular Disease

## 2023-01-13 DIAGNOSIS — R600 Localized edema: Secondary | ICD-10-CM | POA: Insufficient documentation

## 2023-01-13 DIAGNOSIS — I272 Pulmonary hypertension, unspecified: Secondary | ICD-10-CM | POA: Insufficient documentation

## 2023-01-13 LAB — ECHOCARDIOGRAM COMPLETE
AR max vel: 1.69 cm2
AV Area VTI: 1.38 cm2
AV Area mean vel: 1.47 cm2
AV Mean grad: 11 mmHg
AV Peak grad: 16.5 mmHg
Ao pk vel: 2.03 m/s
Area-P 1/2: 4.4 cm2
S' Lateral: 3.7 cm

## 2023-01-22 ENCOUNTER — Ambulatory Visit: Payer: Medicare PPO | Admitting: Nurse Practitioner

## 2023-01-22 ENCOUNTER — Encounter: Payer: Self-pay | Admitting: Nurse Practitioner

## 2023-01-22 VITALS — BP 118/70 | HR 65 | Ht 69.0 in | Wt 244.6 lb

## 2023-01-22 DIAGNOSIS — I272 Pulmonary hypertension, unspecified: Secondary | ICD-10-CM | POA: Diagnosis not present

## 2023-01-22 DIAGNOSIS — J4530 Mild persistent asthma, uncomplicated: Secondary | ICD-10-CM

## 2023-01-22 DIAGNOSIS — R490 Dysphonia: Secondary | ICD-10-CM | POA: Diagnosis not present

## 2023-01-22 DIAGNOSIS — G4733 Obstructive sleep apnea (adult) (pediatric): Secondary | ICD-10-CM

## 2023-01-22 DIAGNOSIS — J309 Allergic rhinitis, unspecified: Secondary | ICD-10-CM | POA: Diagnosis not present

## 2023-01-22 LAB — BASIC METABOLIC PANEL
BUN: 19 mg/dL (ref 6–23)
CO2: 28 mEq/L (ref 19–32)
Calcium: 9.5 mg/dL (ref 8.4–10.5)
Chloride: 107 mEq/L (ref 96–112)
Creatinine, Ser: 1.09 mg/dL (ref 0.40–1.50)
GFR: 66.79 mL/min (ref >60.00)
Glucose, Bld: 99 mg/dL (ref 70–99)
Potassium: 4.4 mEq/L (ref 3.5–5.1)
Sodium: 141 mEq/L (ref 135–145)

## 2023-01-22 MED ORDER — FUROSEMIDE 40 MG PO TABS
40.0000 mg | ORAL_TABLET | Freq: Every day | ORAL | 0 refills | Status: DC
Start: 2023-01-22 — End: 2023-02-04

## 2023-01-22 NOTE — Assessment & Plan Note (Signed)
Compensated on current regimen

## 2023-01-22 NOTE — Assessment & Plan Note (Signed)
Excellent compliance and control. Receives benefit from use. Aware of proper use/care of device. Understands safe driving practices.

## 2023-01-22 NOTE — Assessment & Plan Note (Addendum)
Moderately elevated PASP with right heart strain on echo. Group 3 r/t OSA and possibly component of Group 2? Recommend RHC for further evaluation. Will send referral to Dr. Shirlee Latch to set this up. I will treat him with lasix given his current symptoms of volume overload. Schedule for baseline. Follow up with Dr. Judeth Horn for [facility] consult.  Patient Instructions  Continue Advair 1 puff Twice daily. Brush tongue and rinse mouth afterwards  Continue Albuterol inhaler 2 puffs every 6 hours as needed for shortness of breath or wheezing. Notify if symptoms persist despite rescue inhaler/neb use.  Continue BiPAP nightly. Adjust settings to auto set IPAP max 16, EPAP min 10 and PS 4.    Lasix 40 mg daily for 5 days. Take in AM with food  Come back next Thursday or Friday for repeat labs. Call the morning of to make sure our lab tech is here  Referral for right heart catheterization - someone will contact you to schedule this  6 minute walk test scheduled    Follow up with Dr. Judeth Horn in next available consult slot for pulmonary hypertension. If symptoms do not improve or worsen, please contact office for sooner follow up or seek emergency care.

## 2023-01-22 NOTE — Progress Notes (Signed)
@Patient  ID: Warren Weeks, male    DOB: 03-29-1946, 77 y.o.   MRN: [id number]  Chief Complaint  Patient presents with   Follow-up    Echo, cpap f/u     Referring provider: Corwin Levins, MD  HPI: 77 year old male, former smoker followed for DOE and OSA. He is a patient of Dr. Trena Platt and last seen in office 12/09/2022 by Allison Quarry NP. Past medical history significant for HTN, carotid artery stenosis, PVD, allergic rhinitis, GERD, BPH, anxiety, ED, HLD, diastolic dysfunction.   TEST/EVENTS:  10/22/2021 echo: EF 55 to 60%.  G1 DD.  RV size and function is normal.  Mild PAH.  LA and RA moderately dilated.  Trivial MR. 12/10/2021 HST: AHI 27.3/h, SpO2 low 68% 12/16/2021 PFT: FVC 82, FEV1 97, ratio 87, TLC 84, DLCO 79 09/30/2022 CT chest without contrast: Atherosclerosis/CAD.  Pulmonary trunk normal in caliber.  Mild centrilobular emphysematous changes.  Mild bronchiectasis and atelectasis in the right lower lobe.  Scarring/fibrosis in left lingula, unchanged compared to prior. 11/25/2022 PFT: FVC 84, FEV1 103, ratio 87, TLC 83, DLCO 78.  No BD 03/05/2022 BiPAP titration >> 20/16 cmH2O  08/19/2022: OV with Dr. Wynona Neat. Hx of OSA, not using BiPAP. Had an illness in December; afterwards, unable to tolerate his machine. Has some difficulty with mask and pressure. Feels leaks a lot. Trying to use it was keeping him up more. Uses advair twice a day. DOE multifactorial related to emphysema, diastolic dysfunction, mild PH. Educated on the importance of treating sleep disordered breathing. Advised to resume biPAP - adjustments made to pressure 16/12. Repeat CT chest in April. Needs PFTs.   12/09/2022: OV with  NP for follow up to discuss CT scan results and PFTs. CT from April revealed stable scarring/fibrosis in the LUL as well as emphysema. No suspicious nodules. His PFTs were normal and overall stable. He did have a very slight diffusion defect which corrected to normal for alveolar volume and relatively  similar compared to previous testing. His last echo a year ago showed diastolic dysfunction and mild PH. His breathing feels unchanged compared to when he was here last. He still gets short winded, especially with any sort of distance walking. He has an occasional cough, which is dry and does not bother him. He does have some swelling in his legs, which seems to be increased over the past few months. He denies any orthopnea, palpitations, lightheadedness/dizziness, syncope, CP, wheezing, chest congestion, hemoptysis. He uses his advair twice a day, which does help. Does not use his rescue. Wearing BiPAP again but still having some trouble with dry mouth and pressures.  11/17/2022-12/16/2022: BiPAP 16/12 30/30 days; 100% >4 hr; av use 7 hr 44 min Leaks 67.4 AHI 0.6  01/22/2023: Today - follow up Patient presents today for follow up to discuss echo results, which revealed moderately elevated pulmonary artery pressures, evidence of right heart strain, and left diastolic dysfunction. He feels unchanged compared to his last visit. Has trouble with stair walking and distance walking. Still has an occasional cough, which is dry and unchanged from baseline. His leg swelling is stable since our last visit but increased over the past few months. Denies orthopnea with BiPAP, palpitations, chest pain, wheezing, chest congestion, syncope, lightheadedness. He does have some persistent voice hoarseness that he can't seem to get rid of. Has been ongoing for the last 6 months or so. He does have flonase and saline nasal spray he uses. Doesn't notice any significant sinus congestion  or drainage. No difficulties swallowing. He's on Advair. Doesn't have to use his albuterol. Wearing BiPAP nightly.  12/23/2022-01/21/2023: BiPAP 16/12 30/30 days; 100% >4 hr; average use 7 hr 55 minute Leaks 95th 43.4 AHI 0.5  Allergies  Allergen Reactions   Cefuroxime Axetil Rash    Immunization History  Administered Date(s) Administered    Fluad Quad(high Dose 65+) 02/21/2019, 03/12/2021   H1N1 04/22/2008   Influenza Whole 06/10/2009   Influenza, High Dose Seasonal PF 03/20/2016, 03/16/2017, 03/16/2018   Influenza,inj,Quad PF,6+ Mos 04/10/2015   Influenza-Unspecified 03/22/2012, 09/03/2014, 04/22/2020, 04/06/2022   PFIZER(Purple Top)SARS-COV-2 Vaccination 07/24/2019, 08/21/2019, 06/24/2020   Pneumococcal Conjugate-13 12/12/2013   Pneumococcal Polysaccharide-23 12/14/2014   RSV,unspecified 04/06/2022   Td 10/03/2008   Tdap 12/07/2018   Zoster Recombinant(Shingrix) 09/24/2017, 12/20/2017   Zoster, Live 10/12/2012    Past Medical History:  Diagnosis Date   ALLERGIC RHINITIS 09/16/2007   ALLERGY 03/24/2007   Allergy    ANXIETY 03/28/2007   Arthritis    knees, back,shoulder   CAROTID ARTERY STENOSIS, BILATERAL 07/01/2009   yearly checks - no current problems   CEPHALGIA 06/08/2009   CHEST PAIN 09/16/2007   no current problems   COLONIC POLYPS, HX OF 09/16/2007   COPD (chronic obstructive pulmonary disease) (HCC) 05/12/2017   mild per patient - no inhaler   Dizziness and giddiness 06/08/2009   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   DYSPNEA/SHORTNESS OF BREATH 09/16/2007   ERECTILE DYSFUNCTION 03/28/2007   ESOPHAGEAL STRICTURE 09/16/2007   ESOPHAGITIS 03/24/2007   GERD 03/24/2007   GLUCOSE INTOLERANCE 09/16/2007   HEMORRHOIDS, INTERNAL 03/24/2007   HYPERCHOLESTEROLEMIA 03/24/2007   HYPERLIPIDEMIA 09/16/2007   HYPERTENSION 03/28/2007   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/2011   Impaired glucose tolerance 10/04/2010   LOW BACK PAIN 03/28/2007   MAGNETIC RESONANCE IMAGING, BRAIN, ABNORMAL 06/26/2009   NECK PAIN, LEFT 06/20/2010   OBESITY 03/24/2007   OTITIS MEDIA, LEFT 06/10/2009   Palpitations 06/20/2010   no current problems   PVD 06/25/2009   RASH-NONVESICULAR 06/17/2009   Sleep apnea 12/25/2021   URI 08/16/2009    Tobacco History: Social History   Tobacco Use  Smoking Status Former   Current packs/day: 0.00   Average packs/day: 0.5  packs/day for 15.0 years (7.5 ttl pk-yrs)   Types: Cigarettes   Start date: 06/22/1976   Quit date: 06/23/1991   Years since quitting: 31.6  Smokeless Tobacco Never  Tobacco Comments   Quit 30 years ago   Counseling given: Not Answered Tobacco comments: Quit 30 years ago   Outpatient Medications Prior to Visit  Medication Sig Dispense Refill   acetaminophen (TYLENOL) 650 MG CR tablet Take 650 mg by mouth every 8 (eight) hours as needed for pain.     aspirin 81 MG EC tablet Take 1 tablet (81 mg total) by mouth daily. Swallow whole. 30 tablet 12   Cyanocobalamin (B-12 PO) Take 1 tablet by mouth daily.     Cyanocobalamin POWD Take 1 tablet by mouth daily.     diclofenac Sodium (VOLTAREN) 1 % GEL Apply 2 g topically daily as needed (knee pain).     Diclofenac Sodium CR 100 MG 24 hr tablet Take 1 tablet by mouth once daily 90 tablet 1   ezetimibe (ZETIA) 10 MG tablet Take 1 tablet by mouth once daily 90 tablet 3   Ferrous Sulfate (IRON PO) Take 1 tablet by mouth daily.     fluticasone (FLONASE) 50 MCG/ACT nasal spray Use 2 spray(s) in each nostril once daily 16 g 0   fluticasone-salmeterol (  ADVAIR) 250-50 MCG/ACT AEPB INHALE 1 DOSE BY MOUTH ONCE DAILY IN THE MORNING AND 1 ONCE DAILY IN THE EVENING 60 each 5   hydrochlorothiazide (HYDRODIURIL) 25 MG tablet Take 1 tablet by mouth once daily 90 tablet 3   HYDROcodone bit-homatropine (HYCODAN) 5-1.5 MG/5ML syrup Take 5 mLs by mouth every 6 (six) hours as needed for cough. 120 mL 0   ketorolac (ACULAR) 0.5 % ophthalmic solution      lisinopril (ZESTRIL) 20 MG tablet Take 1 tablet (20 mg total) by mouth daily. 90 tablet 3   Melatonin 10 MG TABS Take 10 mg by mouth at bedtime.      methocarbamol (ROBAXIN) 500 MG tablet Take 1 tablet (500 mg total) by mouth 2 (two) times daily as needed. 60 tablet 2   metoprolol tartrate (LOPRESSOR) 50 MG tablet Take 1 tablet (50 mg total) by mouth 2 (two) times daily. 180 tablet 1   Multiple Vitamin (ONE-A-DAY 55  PLUS PO) Take 1 tablet by mouth daily.     nystatin (MYCOSTATIN) 100000 UNIT/ML suspension Take 5 mLs (500,000 Units total) by mouth 4 (four) times daily. 120 mL 1   ofloxacin (OCUFLOX) 0.3 % ophthalmic solution      Omega-3 Fatty Acids (FISH OIL) 1000 MG CAPS Take 1,000 mg by mouth daily.     omeprazole (PRILOSEC) 20 MG capsule Take 1 capsule by mouth twice daily 180 capsule 0   prednisoLONE acetate (PRED FORTE) 1 % ophthalmic suspension      rosuvastatin (CRESTOR) 40 MG tablet Take 1 tablet by mouth once daily 90 tablet 3   sildenafil (VIAGRA) 100 MG tablet Take 1 tablet (100 mg total) by mouth as needed for erectile dysfunction. 10 tablet 11   solifenacin (VESICARE) 5 MG tablet Take 1 tablet (5 mg total) by mouth daily. (Patient taking differently: Take 5 mg by mouth daily as needed (bladder).) 90 tablet 3   tadalafil (CIALIS) 20 MG tablet Take 20 mg by mouth as needed.     terazosin (HYTRIN) 5 MG capsule Take 5 mg by mouth at bedtime.      Testosterone 20.25 MG/1.25GM (1.62%) GEL Apply 2 Packages topically daily.     thiamine 250 MG tablet Take 250 mg by mouth daily.     traZODone (DESYREL) 100 MG tablet Take 1 tablet (100 mg total) by mouth at bedtime as needed for sleep. 90 tablet 1   triamcinolone cream (KENALOG) 0.5 % Apply 1 application topically 3 (three) times daily. 30 g 1   Turmeric 500 MG TABS Take 500 mg by mouth daily.     albuterol (VENTOLIN HFA) 108 (90 Base) MCG/ACT inhaler Inhale 2 puffs into the lungs every 6 (six) hours as needed for wheezing or shortness of breath. (Patient not taking: Reported on 08/19/2022) 8 g 0   albuterol (VENTOLIN HFA) 108 (90 Base) MCG/ACT inhaler Inhale 1-2 puffs into the lungs every 6 (six) hours as needed for wheezing or shortness of breath. (Patient not taking: Reported on 12/09/2022) 8 g 0   mometasone (NASONEX) 50 MCG/ACT nasal spray Place 2 sprays into the nose daily. (Patient not taking: Reported on 12/09/2022) 1 each 12   predniSONE (DELTASONE)  20 MG tablet Take 2 tablets (40 mg total) by mouth daily. (Patient not taking: Reported on 12/09/2022) 10 tablet 0   No facility-administered medications prior to visit.     Review of Systems:   Constitutional: No weight loss or gain, night sweats, fevers, chills, fatigue, or lassitude. HEENT: No  headaches, difficulty swallowing, tooth/dental problems, or sore throat. No sneezing, itching, ear ache, nasal congestion, or post nasal drip. +voice hoarseness CV:  +swelling in lower extremities. No chest pain, orthopnea, PND, anasarca, dizziness, palpitations, syncope Resp: +shortness of breath with exertion; occasional dry cough. No excess mucus or change in color of mucus. No hemoptysis. No wheezing.  No chest wall deformity GI:  No heartburn, indigestion GU: No dysuria, change in color of urine, urgency or frequency.   Skin: No rash, lesions, ulcerations MSK:  No joint pain or swelling.   Neuro: No dizziness or lightheadedness.  Psych: No depression or anxiety. Mood stable.     Physical Exam:  BP 118/70   Pulse 65   Ht 5\' 9"  (1.753 m)   Wt 244 lb 9.6 oz (110.9 kg)   SpO2 99%   BMI 36.12 kg/m   GEN: Pleasant, interactive, well-appearing; obese; in no acute distress. HEENT:  Normocephalic and atraumatic. PERRLA. Sclera white. Nasal turbinates pink, moist and patent bilaterally. No rhinorrhea present. Oropharynx pink and moist, without exudate or edema. No lesions, ulcerations, or postnasal drip. Hoarse voice quality NECK:  Supple w/ fair ROM. No JVD present. Normal carotid impulses w/o bruits. Thyroid symmetrical with no goiter or nodules palpated. No lymphadenopathy.   CV: RRR, no m/r/g, +1 BLE edema. Pulses intact, +2 bilaterally. No cyanosis, pallor or clubbing. PULMONARY:  Unlabored, regular breathing. Clear bilaterally A&P w/o wheezes/rales/rhonchi. No accessory muscle use.  GI: BS present and normoactive. Soft, non-tender to palpation. No organomegaly or masses detected.  MSK:  No erythema, warmth or tenderness. Cap refil <2 sec all extrem. No deformities or joint swelling noted.  Neuro: A/Ox3. No focal deficits noted.   Skin: Warm, no lesions or rashe Psych: Normal affect and behavior. Judgement and thought content appropriate.     Lab Results:  CBC    Component Value Date/Time   WBC 7.1 10/06/2022 1639   RBC 4.31 10/06/2022 1639   HGB 13.2 10/06/2022 1639   HCT 39.5 10/06/2022 1639   PLT 151.0 10/06/2022 1639   MCV 91.6 10/06/2022 1639   MCH 30.6 06/22/2022 1402   MCHC 33.5 10/06/2022 1639   RDW 14.8 10/06/2022 1639   LYMPHSABS 1.2 10/06/2022 1639   MONOABS 0.5 10/06/2022 1639   EOSABS 0.1 10/06/2022 1639   BASOSABS 0.1 10/06/2022 1639    BMET    Component Value Date/Time   NA 141 12/09/2022 1602   K 4.8 12/09/2022 1602   CL 107 12/09/2022 1602   CO2 26 12/09/2022 1602   GLUCOSE 149 (H) 12/09/2022 1602   BUN 19 12/09/2022 1602   CREATININE 1.19 12/09/2022 1602   CALCIUM 8.9 12/09/2022 1602   GFRNONAA >60 06/22/2022 1402   GFRAA >60 02/14/2020 1720    BNP    Component Value Date/Time   BNP 160.4 (H) 06/22/2022 1401     Imaging:  ECHOCARDIOGRAM COMPLETE  Result Date: 01/13/2023    ECHOCARDIOGRAM REPORT   Patient Name:   HILLMAN ATTIG Heft Date of Exam: 01/13/2023 Medical Rec #:  [id number]     Height:       69.0 in Accession #:    [id number]    Weight:       246.2 lb Date of Birth:  05/02/1946     BSA:          2.257 m Patient Age:    76 years      BP:           120/60 mmHg  Patient Gender: M             HR:           80 bpm. Exam Location:  [address] Procedure: 2D Echo, Cardiac Doppler and Color Doppler Indications:    I27.2 Pulmonary hypertension                 R06.00 Dyspnea  History:        Patient has prior history of Echocardiogram examinations, most                 recent 10/22/2021. COPD; Risk Factors:Hypertension and                 Dyslipidemia. Obstructive sleep apnea-CPAP.  Sonographer:    Daphine Deutscher RDCS Referring  Phys: [id number] [employer]  IMPRESSIONS  1. Left ventricular ejection fraction, by estimation, is 55 to 60%. The left ventricle has normal function. The left ventricle has no regional wall motion abnormalities. Left ventricular diastolic parameters are consistent with Grade I diastolic dysfunction (impaired relaxation).  2. Right ventricular systolic function is normal. The right ventricular size is mildly enlarged. There is moderately elevated pulmonary artery systolic pressure. The estimated right ventricular systolic pressure is 46.6 mmHg.  3. Left atrial size was mildly dilated.  4. The mitral valve is grossly normal. Mild mitral valve regurgitation. No evidence of mitral stenosis.  5. The aortic valve is tricuspid. There is moderate calcification of the aortic valve. There is moderate thickening of the aortic valve. Aortic valve regurgitation is not visualized. Mild aortic valve stenosis. Aortic valve mean gradient measures 11.0 mmHg. Aortic valve Vmax measures 2.03 m/s.  6. The inferior vena cava is normal in size with greater than 50% respiratory variability, suggesting right atrial pressure of 3 mmHg. Comparison(s): No significant change from prior study. FINDINGS  Left Ventricle: Left ventricular ejection fraction, by estimation, is 55 to 60%. The left ventricle has normal function. The left ventricle has no regional wall motion abnormalities. The left ventricular internal cavity size was normal in size. There is  no left ventricular hypertrophy. Left ventricular diastolic parameters are consistent with Grade I diastolic dysfunction (impaired relaxation). Right Ventricle: The right ventricular size is mildly enlarged. No increase in right ventricular wall thickness. Right ventricular systolic function is normal. There is moderately elevated pulmonary artery systolic pressure. The tricuspid regurgitant velocity is 3.30 m/s, and with an assumed right atrial pressure of 3 mmHg, the estimated right  ventricular systolic pressure is 46.6 mmHg. Left Atrium: Left atrial size was mildly dilated. Right Atrium: Right atrial size was normal in size. Pericardium: There is no evidence of pericardial effusion. Mitral Valve: The mitral valve is grossly normal. Mild mitral valve regurgitation. No evidence of mitral valve stenosis. Tricuspid Valve: The tricuspid valve is grossly normal. Tricuspid valve regurgitation is mild . No evidence of tricuspid stenosis. Aortic Valve: The aortic valve is tricuspid. There is moderate calcification of the aortic valve. There is moderate thickening of the aortic valve. Aortic valve regurgitation is not visualized. Mild aortic stenosis is present. Aortic valve mean gradient measures 11.0 mmHg. Aortic valve peak gradient measures 16.5 mmHg. Aortic valve area, by VTI measures 1.38 cm. Pulmonic Valve: The pulmonic valve was grossly normal. Pulmonic valve regurgitation is not visualized. No evidence of pulmonic stenosis. Aorta: The aortic root and ascending aorta are structurally normal, with no evidence of dilitation. Venous: The inferior vena cava is normal in size with greater than 50% respiratory variability, suggesting right atrial  pressure of 3 mmHg. IAS/Shunts: The atrial septum is grossly normal.  LEFT VENTRICLE PLAX 2D LVIDd:         5.00 cm   Diastology LVIDs:         3.70 cm   LV e' medial:    7.40 cm/s LV PW:         0.90 cm   LV E/e' medial:  10.6 LV IVS:        0.90 cm   LV e' lateral:   7.62 cm/s LVOT diam:     2.10 cm   LV E/e' lateral: 10.3 LV SV:         70 LV SV Index:   31 LVOT Area:     3.46 cm  RIGHT VENTRICLE             IVC RV Basal diam:  4.60 cm     IVC diam: 2.00 cm RV S prime:     16.05 cm/s TAPSE (M-mode): 3.0 cm LEFT ATRIUM             Index        RIGHT ATRIUM           Index LA diam:        5.50 cm 2.44 cm/m   RA Area:     14.20 cm LA Vol (A2C):   68.8 ml 30.49 ml/m  RA Volume:   35.50 ml  15.73 ml/m LA Vol (A4C):   89.2 ml 39.53 ml/m LA Biplane Vol:  82.0 ml 36.33 ml/m  AORTIC VALVE AV Area (Vmax):    1.69 cm AV Area (Vmean):   1.47 cm AV Area (VTI):     1.38 cm AV Vmax:           203.20 cm/s AV Vmean:          158.600 cm/s AV VTI:            0.506 m AV Peak Grad:      16.5 mmHg AV Mean Grad:      11.0 mmHg LVOT Vmax:         99.33 cm/s LVOT Vmean:        67.100 cm/s LVOT VTI:          0.201 m LVOT/AV VTI ratio: 0.40  AORTA Ao Root diam: 3.30 cm Ao Asc diam:  3.40 cm MITRAL VALVE                TRICUSPID VALVE MV Area (PHT): 4.40 cm     TR Peak grad:   43.6 mmHg MV Decel Time: 173 msec     TR Vmax:        330.00 cm/s MV E velocity: 78.35 cm/s MV A velocity: 100.50 cm/s  SHUNTS MV E/A ratio:  0.78         Systemic VTI:  0.20 m                             Systemic Diam: 2.10 cm Lennie Odor MD Electronically signed by Lennie Odor MD Signature Date/Time: 01/13/2023/9:56:04 AM    Final     Administration History     None          Latest Ref Rng & Units 11/25/2022    9:58 AM 12/16/2021    9:03 AM  PFT Results  FVC-Pre L 3.49  3.41   FVC-Predicted Pre % 84  82   FVC-Post L  3.46  3.47   FVC-Predicted Post % 84  83   Pre FEV1/FVC % % 89  86   Post FEV1/FCV % % 87  87   FEV1-Pre L 3.09  2.94   FEV1-Predicted Pre % 103  97   FEV1-Post L 2.99  3.00   DLCO uncorrected ml/min/mmHg 19.21  19.65   DLCO UNC% % 78  79   DLCO corrected ml/min/mmHg 19.21  20.64   DLCO COR %Predicted % 78  83   DLVA Predicted % 92  102   TLC L 5.69  5.79   TLC % Predicted % 83  84   RV % Predicted % 82  94     No results found for: "NITRICOXIDE"      Assessment & Plan:   Pulmonary hypertension (HCC) Moderately elevated PASP with right heart strain on echo. Group 3 r/t OSA and possibly component of Group 2? Recommend RHC for further evaluation. Will send referral to Dr. Shirlee Latch to set this up. I will treat him with lasix given his current symptoms of volume overload. Schedule for baseline. Follow up with Dr. Judeth Horn for [facility] consult.  Patient  Instructions  Continue Advair 1 puff Twice daily. Brush tongue and rinse mouth afterwards  Continue Albuterol inhaler 2 puffs every 6 hours as needed for shortness of breath or wheezing. Notify if symptoms persist despite rescue inhaler/neb use.  Continue BiPAP nightly. Adjust settings to auto set IPAP max 16, EPAP min 10 and PS 4.    Lasix 40 mg daily for 5 days. Take in AM with food  Come back next Thursday or Friday for repeat labs. Call the morning of to make sure our lab tech is here  Referral for right heart catheterization - someone will contact you to schedule this  6 minute walk test scheduled    Follow up with Dr. Judeth Horn in next available consult slot for pulmonary hypertension. If symptoms do not improve or worsen, please contact office for sooner follow up or seek emergency care.    Sleep apnea Excellent compliance and control. Receives benefit from use. Aware of proper use/care of device. Understands safe driving practices.   Asthma No formal obstruction on PFTs. He receives benefit from use of ICS/LABA. Minimal SABA use. Action plan in place  Allergic rhinitis Compensated on current regimen  Voice hoarseness Persistent voice hoarseness despite control of rhinitis symptoms. GERD controlled with PPI. Will refer to ENT for further evaluation.     I spent 42 minutes of dedicated to the care of this patient on the date of this encounter to include pre-visit review of records, face-to-face time with the patient discussing conditions above, post visit ordering of testing, clinical documentation with the electronic health record, making appropriate referrals as documented, and communicating necessary findings to members of the patients care team.  Noemi Chapel, NP 01/22/2023  Pt aware and understands NP's role.

## 2023-01-22 NOTE — Assessment & Plan Note (Signed)
No formal obstruction on PFTs. He receives benefit from use of ICS/LABA. Minimal SABA use. Action plan in place

## 2023-01-22 NOTE — H&P (View-Only) (Signed)
 @Patient  ID: Warren Weeks, male    DOB: 03-29-1946, 77 y.o.   MRN: [id number]  Chief Complaint  Patient presents with   Follow-up    Echo, cpap f/u     Referring provider: Corwin Levins, MD  HPI: 77 year old male, former smoker followed for DOE and OSA. He is a patient of Dr. Trena Platt and last seen in office 12/09/2022 by Allison Quarry NP. Past medical history significant for HTN, carotid artery stenosis, PVD, allergic rhinitis, GERD, BPH, anxiety, ED, HLD, diastolic dysfunction.   TEST/EVENTS:  10/22/2021 echo: EF 55 to 60%.  G1 DD.  RV size and function is normal.  Mild PAH.  LA and RA moderately dilated.  Trivial MR. 12/10/2021 HST: AHI 27.3/h, SpO2 low 68% 12/16/2021 PFT: FVC 82, FEV1 97, ratio 87, TLC 84, DLCO 79 09/30/2022 CT chest without contrast: Atherosclerosis/CAD.  Pulmonary trunk normal in caliber.  Mild centrilobular emphysematous changes.  Mild bronchiectasis and atelectasis in the right lower lobe.  Scarring/fibrosis in left lingula, unchanged compared to prior. 11/25/2022 PFT: FVC 84, FEV1 103, ratio 87, TLC 83, DLCO 78.  No BD 03/05/2022 BiPAP titration >> 20/16 cmH2O  08/19/2022: OV with Dr. Wynona Neat. Hx of OSA, not using BiPAP. Had an illness in December; afterwards, unable to tolerate his machine. Has some difficulty with mask and pressure. Feels leaks a lot. Trying to use it was keeping him up more. Uses advair twice a day. DOE multifactorial related to emphysema, diastolic dysfunction, mild PH. Educated on the importance of treating sleep disordered breathing. Advised to resume biPAP - adjustments made to pressure 16/12. Repeat CT chest in April. Needs PFTs.   12/09/2022: OV with Cobb NP for follow up to discuss CT scan results and PFTs. CT from April revealed stable scarring/fibrosis in the LUL as well as emphysema. No suspicious nodules. His PFTs were normal and overall stable. He did have a very slight diffusion defect which corrected to normal for alveolar volume and relatively  similar compared to previous testing. His last echo a year ago showed diastolic dysfunction and mild PH. His breathing feels unchanged compared to when he was here last. He still gets short winded, especially with any sort of distance walking. He has an occasional cough, which is dry and does not bother him. He does have some swelling in his legs, which seems to be increased over the past few months. He denies any orthopnea, palpitations, lightheadedness/dizziness, syncope, CP, wheezing, chest congestion, hemoptysis. He uses his advair twice a day, which does help. Does not use his rescue. Wearing BiPAP again but still having some trouble with dry mouth and pressures.  11/17/2022-12/16/2022: BiPAP 16/12 30/30 days; 100% >4 hr; av use 7 hr 44 min Leaks 67.4 AHI 0.6  01/22/2023: Today - follow up Patient presents today for follow up to discuss echo results, which revealed moderately elevated pulmonary artery pressures, evidence of right heart strain, and left diastolic dysfunction. He feels unchanged compared to his last visit. Has trouble with stair walking and distance walking. Still has an occasional cough, which is dry and unchanged from baseline. His leg swelling is stable since our last visit but increased over the past few months. Denies orthopnea with BiPAP, palpitations, chest pain, wheezing, chest congestion, syncope, lightheadedness. He does have some persistent voice hoarseness that he can't seem to get rid of. Has been ongoing for the last 6 months or so. He does have flonase and saline nasal spray he uses. Doesn't notice any significant sinus congestion  or drainage. No difficulties swallowing. He's on Advair. Doesn't have to use his albuterol. Wearing BiPAP nightly.  12/23/2022-01/21/2023: BiPAP 16/12 30/30 days; 100% >4 hr; average use 7 hr 55 minute Leaks 95th 43.4 AHI 0.5  Allergies  Allergen Reactions   Cefuroxime Axetil Rash    Immunization History  Administered Date(s) Administered    Fluad Quad(high Dose 65+) 02/21/2019, 03/12/2021   H1N1 04/22/2008   Influenza Whole 06/10/2009   Influenza, High Dose Seasonal PF 03/20/2016, 03/16/2017, 03/16/2018   Influenza,inj,Quad PF,6+ Mos 04/10/2015   Influenza-Unspecified 03/22/2012, 09/03/2014, 04/22/2020, 04/06/2022   PFIZER(Purple Top)SARS-COV-2 Vaccination 07/24/2019, 08/21/2019, 06/24/2020   Pneumococcal Conjugate-13 12/12/2013   Pneumococcal Polysaccharide-23 12/14/2014   RSV,unspecified 04/06/2022   Td 10/03/2008   Tdap 12/07/2018   Zoster Recombinant(Shingrix) 09/24/2017, 12/20/2017   Zoster, Live 10/12/2012    Past Medical History:  Diagnosis Date   ALLERGIC RHINITIS 09/16/2007   ALLERGY 03/24/2007   Allergy    ANXIETY 03/28/2007   Arthritis    knees, back,shoulder   CAROTID ARTERY STENOSIS, BILATERAL 07/01/2009   yearly checks - no current problems   CEPHALGIA 06/08/2009   CHEST PAIN 09/16/2007   no current problems   COLONIC POLYPS, HX OF 09/16/2007   COPD (chronic obstructive pulmonary disease) (HCC) 05/12/2017   mild per patient - no inhaler   Dizziness and giddiness 06/08/2009   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   DYSPNEA/SHORTNESS OF BREATH 09/16/2007   ERECTILE DYSFUNCTION 03/28/2007   ESOPHAGEAL STRICTURE 09/16/2007   ESOPHAGITIS 03/24/2007   GERD 03/24/2007   GLUCOSE INTOLERANCE 09/16/2007   HEMORRHOIDS, INTERNAL 03/24/2007   HYPERCHOLESTEROLEMIA 03/24/2007   HYPERLIPIDEMIA 09/16/2007   HYPERTENSION 03/28/2007   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/2011   Impaired glucose tolerance 10/04/2010   LOW BACK PAIN 03/28/2007   MAGNETIC RESONANCE IMAGING, BRAIN, ABNORMAL 06/26/2009   NECK PAIN, LEFT 06/20/2010   OBESITY 03/24/2007   OTITIS MEDIA, LEFT 06/10/2009   Palpitations 06/20/2010   no current problems   PVD 06/25/2009   RASH-NONVESICULAR 06/17/2009   Sleep apnea 12/25/2021   URI 08/16/2009    Tobacco History: Social History   Tobacco Use  Smoking Status Former   Current packs/day: 0.00   Average packs/day: 0.5  packs/day for 15.0 years (7.5 ttl pk-yrs)   Types: Cigarettes   Start date: 06/22/1976   Quit date: 06/23/1991   Years since quitting: 31.6  Smokeless Tobacco Never  Tobacco Comments   Quit 30 years ago   Counseling given: Not Answered Tobacco comments: Quit 30 years ago   Outpatient Medications Prior to Visit  Medication Sig Dispense Refill   acetaminophen (TYLENOL) 650 MG CR tablet Take 650 mg by mouth every 8 (eight) hours as needed for pain.     aspirin 81 MG EC tablet Take 1 tablet (81 mg total) by mouth daily. Swallow whole. 30 tablet 12   Cyanocobalamin (B-12 PO) Take 1 tablet by mouth daily.     Cyanocobalamin POWD Take 1 tablet by mouth daily.     diclofenac Sodium (VOLTAREN) 1 % GEL Apply 2 g topically daily as needed (knee pain).     Diclofenac Sodium CR 100 MG 24 hr tablet Take 1 tablet by mouth once daily 90 tablet 1   ezetimibe (ZETIA) 10 MG tablet Take 1 tablet by mouth once daily 90 tablet 3   Ferrous Sulfate (IRON PO) Take 1 tablet by mouth daily.     fluticasone (FLONASE) 50 MCG/ACT nasal spray Use 2 spray(s) in each nostril once daily 16 g 0   fluticasone-salmeterol (  ADVAIR) 250-50 MCG/ACT AEPB INHALE 1 DOSE BY MOUTH ONCE DAILY IN THE MORNING AND 1 ONCE DAILY IN THE EVENING 60 each 5   hydrochlorothiazide (HYDRODIURIL) 25 MG tablet Take 1 tablet by mouth once daily 90 tablet 3   HYDROcodone bit-homatropine (HYCODAN) 5-1.5 MG/5ML syrup Take 5 mLs by mouth every 6 (six) hours as needed for cough. 120 mL 0   ketorolac (ACULAR) 0.5 % ophthalmic solution      lisinopril (ZESTRIL) 20 MG tablet Take 1 tablet (20 mg total) by mouth daily. 90 tablet 3   Melatonin 10 MG TABS Take 10 mg by mouth at bedtime.      methocarbamol (ROBAXIN) 500 MG tablet Take 1 tablet (500 mg total) by mouth 2 (two) times daily as needed. 60 tablet 2   metoprolol tartrate (LOPRESSOR) 50 MG tablet Take 1 tablet (50 mg total) by mouth 2 (two) times daily. 180 tablet 1   Multiple Vitamin (ONE-A-DAY 55  PLUS PO) Take 1 tablet by mouth daily.     nystatin (MYCOSTATIN) 100000 UNIT/ML suspension Take 5 mLs (500,000 Units total) by mouth 4 (four) times daily. 120 mL 1   ofloxacin (OCUFLOX) 0.3 % ophthalmic solution      Omega-3 Fatty Acids (FISH OIL) 1000 MG CAPS Take 1,000 mg by mouth daily.     omeprazole (PRILOSEC) 20 MG capsule Take 1 capsule by mouth twice daily 180 capsule 0   prednisoLONE acetate (PRED FORTE) 1 % ophthalmic suspension      rosuvastatin (CRESTOR) 40 MG tablet Take 1 tablet by mouth once daily 90 tablet 3   sildenafil (VIAGRA) 100 MG tablet Take 1 tablet (100 mg total) by mouth as needed for erectile dysfunction. 10 tablet 11   solifenacin (VESICARE) 5 MG tablet Take 1 tablet (5 mg total) by mouth daily. (Patient taking differently: Take 5 mg by mouth daily as needed (bladder).) 90 tablet 3   tadalafil (CIALIS) 20 MG tablet Take 20 mg by mouth as needed.     terazosin (HYTRIN) 5 MG capsule Take 5 mg by mouth at bedtime.      Testosterone 20.25 MG/1.25GM (1.62%) GEL Apply 2 Packages topically daily.     thiamine 250 MG tablet Take 250 mg by mouth daily.     traZODone (DESYREL) 100 MG tablet Take 1 tablet (100 mg total) by mouth at bedtime as needed for sleep. 90 tablet 1   triamcinolone cream (KENALOG) 0.5 % Apply 1 application topically 3 (three) times daily. 30 g 1   Turmeric 500 MG TABS Take 500 mg by mouth daily.     albuterol (VENTOLIN HFA) 108 (90 Base) MCG/ACT inhaler Inhale 2 puffs into the lungs every 6 (six) hours as needed for wheezing or shortness of breath. (Patient not taking: Reported on 08/19/2022) 8 g 0   albuterol (VENTOLIN HFA) 108 (90 Base) MCG/ACT inhaler Inhale 1-2 puffs into the lungs every 6 (six) hours as needed for wheezing or shortness of breath. (Patient not taking: Reported on 12/09/2022) 8 g 0   mometasone (NASONEX) 50 MCG/ACT nasal spray Place 2 sprays into the nose daily. (Patient not taking: Reported on 12/09/2022) 1 each 12   predniSONE (DELTASONE)  20 MG tablet Take 2 tablets (40 mg total) by mouth daily. (Patient not taking: Reported on 12/09/2022) 10 tablet 0   No facility-administered medications prior to visit.     Review of Systems:   Constitutional: No weight loss or gain, night sweats, fevers, chills, fatigue, or lassitude. HEENT: No  headaches, difficulty swallowing, tooth/dental problems, or sore throat. No sneezing, itching, ear ache, nasal congestion, or post nasal drip. +voice hoarseness CV:  +swelling in lower extremities. No chest pain, orthopnea, PND, anasarca, dizziness, palpitations, syncope Resp: +shortness of breath with exertion; occasional dry cough. No excess mucus or change in color of mucus. No hemoptysis. No wheezing.  No chest wall deformity GI:  No heartburn, indigestion GU: No dysuria, change in color of urine, urgency or frequency.   Skin: No rash, lesions, ulcerations MSK:  No joint pain or swelling.   Neuro: No dizziness or lightheadedness.  Psych: No depression or anxiety. Mood stable.     Physical Exam:  BP 118/70   Pulse 65   Ht 5\' 9"  (1.753 m)   Wt 244 lb 9.6 oz (110.9 kg)   SpO2 99%   BMI 36.12 kg/m   GEN: Pleasant, interactive, well-appearing; obese; in no acute distress. HEENT:  Normocephalic and atraumatic. PERRLA. Sclera white. Nasal turbinates pink, moist and patent bilaterally. No rhinorrhea present. Oropharynx pink and moist, without exudate or edema. No lesions, ulcerations, or postnasal drip. Hoarse voice quality NECK:  Supple w/ fair ROM. No JVD present. Normal carotid impulses w/o bruits. Thyroid symmetrical with no goiter or nodules palpated. No lymphadenopathy.   CV: RRR, no m/r/g, +1 BLE edema. Pulses intact, +2 bilaterally. No cyanosis, pallor or clubbing. PULMONARY:  Unlabored, regular breathing. Clear bilaterally A&P w/o wheezes/rales/rhonchi. No accessory muscle use.  GI: BS present and normoactive. Soft, non-tender to palpation. No organomegaly or masses detected.  MSK:  No erythema, warmth or tenderness. Cap refil <2 sec all extrem. No deformities or joint swelling noted.  Neuro: A/Ox3. No focal deficits noted.   Skin: Warm, no lesions or rashe Psych: Normal affect and behavior. Judgement and thought content appropriate.     Lab Results:  CBC    Component Value Date/Time   WBC 7.1 10/06/2022 1639   RBC 4.31 10/06/2022 1639   HGB 13.2 10/06/2022 1639   HCT 39.5 10/06/2022 1639   PLT 151.0 10/06/2022 1639   MCV 91.6 10/06/2022 1639   MCH 30.6 06/22/2022 1402   MCHC 33.5 10/06/2022 1639   RDW 14.8 10/06/2022 1639   LYMPHSABS 1.2 10/06/2022 1639   MONOABS 0.5 10/06/2022 1639   EOSABS 0.1 10/06/2022 1639   BASOSABS 0.1 10/06/2022 1639    BMET    Component Value Date/Time   NA 141 12/09/2022 1602   K 4.8 12/09/2022 1602   CL 107 12/09/2022 1602   CO2 26 12/09/2022 1602   GLUCOSE 149 (H) 12/09/2022 1602   BUN 19 12/09/2022 1602   CREATININE 1.19 12/09/2022 1602   CALCIUM 8.9 12/09/2022 1602   GFRNONAA >60 06/22/2022 1402   GFRAA >60 02/14/2020 1720    BNP    Component Value Date/Time   BNP 160.4 (H) 06/22/2022 1401     Imaging:  ECHOCARDIOGRAM COMPLETE  Result Date: 01/13/2023    ECHOCARDIOGRAM REPORT   Patient Name:   Warren Weeks Heft Date of Exam: 01/13/2023 Medical Rec #:  [id number]     Height:       69.0 in Accession #:    [id number]    Weight:       246.2 lb Date of Birth:  05/02/1946     BSA:          2.257 m Patient Age:    76 years      BP:           120/60 mmHg  Patient Gender: M             HR:           80 bpm. Exam Location:  [address] Procedure: 2D Echo, Cardiac Doppler and Color Doppler Indications:    I27.2 Pulmonary hypertension                 R06.00 Dyspnea  History:        Patient has prior history of Echocardiogram examinations, most                 recent 10/22/2021. COPD; Risk Factors:Hypertension and                 Dyslipidemia. Obstructive sleep apnea-CPAP.  Sonographer:    Daphine Deutscher RDCS Referring  Phys: [id number] [employer] COBB IMPRESSIONS  1. Left ventricular ejection fraction, by estimation, is 55 to 60%. The left ventricle has normal function. The left ventricle has no regional wall motion abnormalities. Left ventricular diastolic parameters are consistent with Grade I diastolic dysfunction (impaired relaxation).  2. Right ventricular systolic function is normal. The right ventricular size is mildly enlarged. There is moderately elevated pulmonary artery systolic pressure. The estimated right ventricular systolic pressure is 46.6 mmHg.  3. Left atrial size was mildly dilated.  4. The mitral valve is grossly normal. Mild mitral valve regurgitation. No evidence of mitral stenosis.  5. The aortic valve is tricuspid. There is moderate calcification of the aortic valve. There is moderate thickening of the aortic valve. Aortic valve regurgitation is not visualized. Mild aortic valve stenosis. Aortic valve mean gradient measures 11.0 mmHg. Aortic valve Vmax measures 2.03 m/s.  6. The inferior vena cava is normal in size with greater than 50% respiratory variability, suggesting right atrial pressure of 3 mmHg. Comparison(s): No significant change from prior study. FINDINGS  Left Ventricle: Left ventricular ejection fraction, by estimation, is 55 to 60%. The left ventricle has normal function. The left ventricle has no regional wall motion abnormalities. The left ventricular internal cavity size was normal in size. There is  no left ventricular hypertrophy. Left ventricular diastolic parameters are consistent with Grade I diastolic dysfunction (impaired relaxation). Right Ventricle: The right ventricular size is mildly enlarged. No increase in right ventricular wall thickness. Right ventricular systolic function is normal. There is moderately elevated pulmonary artery systolic pressure. The tricuspid regurgitant velocity is 3.30 m/s, and with an assumed right atrial pressure of 3 mmHg, the estimated right  ventricular systolic pressure is 46.6 mmHg. Left Atrium: Left atrial size was mildly dilated. Right Atrium: Right atrial size was normal in size. Pericardium: There is no evidence of pericardial effusion. Mitral Valve: The mitral valve is grossly normal. Mild mitral valve regurgitation. No evidence of mitral valve stenosis. Tricuspid Valve: The tricuspid valve is grossly normal. Tricuspid valve regurgitation is mild . No evidence of tricuspid stenosis. Aortic Valve: The aortic valve is tricuspid. There is moderate calcification of the aortic valve. There is moderate thickening of the aortic valve. Aortic valve regurgitation is not visualized. Mild aortic stenosis is present. Aortic valve mean gradient measures 11.0 mmHg. Aortic valve peak gradient measures 16.5 mmHg. Aortic valve area, by VTI measures 1.38 cm. Pulmonic Valve: The pulmonic valve was grossly normal. Pulmonic valve regurgitation is not visualized. No evidence of pulmonic stenosis. Aorta: The aortic root and ascending aorta are structurally normal, with no evidence of dilitation. Venous: The inferior vena cava is normal in size with greater than 50% respiratory variability, suggesting right atrial  pressure of 3 mmHg. IAS/Shunts: The atrial septum is grossly normal.  LEFT VENTRICLE PLAX 2D LVIDd:         5.00 cm   Diastology LVIDs:         3.70 cm   LV e' medial:    7.40 cm/s LV PW:         0.90 cm   LV E/e' medial:  10.6 LV IVS:        0.90 cm   LV e' lateral:   7.62 cm/s LVOT diam:     2.10 cm   LV E/e' lateral: 10.3 LV SV:         70 LV SV Index:   31 LVOT Area:     3.46 cm  RIGHT VENTRICLE             IVC RV Basal diam:  4.60 cm     IVC diam: 2.00 cm RV S prime:     16.05 cm/s TAPSE (M-mode): 3.0 cm LEFT ATRIUM             Index        RIGHT ATRIUM           Index LA diam:        5.50 cm 2.44 cm/m   RA Area:     14.20 cm LA Vol (A2C):   68.8 ml 30.49 ml/m  RA Volume:   35.50 ml  15.73 ml/m LA Vol (A4C):   89.2 ml 39.53 ml/m LA Biplane Vol:  82.0 ml 36.33 ml/m  AORTIC VALVE AV Area (Vmax):    1.69 cm AV Area (Vmean):   1.47 cm AV Area (VTI):     1.38 cm AV Vmax:           203.20 cm/s AV Vmean:          158.600 cm/s AV VTI:            0.506 m AV Peak Grad:      16.5 mmHg AV Mean Grad:      11.0 mmHg LVOT Vmax:         99.33 cm/s LVOT Vmean:        67.100 cm/s LVOT VTI:          0.201 m LVOT/AV VTI ratio: 0.40  AORTA Ao Root diam: 3.30 cm Ao Asc diam:  3.40 cm MITRAL VALVE                TRICUSPID VALVE MV Area (PHT): 4.40 cm     TR Peak grad:   43.6 mmHg MV Decel Time: 173 msec     TR Vmax:        330.00 cm/s MV E velocity: 78.35 cm/s MV A velocity: 100.50 cm/s  SHUNTS MV E/A ratio:  0.78         Systemic VTI:  0.20 m                             Systemic Diam: 2.10 cm Lennie Odor MD Electronically signed by Lennie Odor MD Signature Date/Time: 01/13/2023/9:56:04 AM    Final     Administration History     None          Latest Ref Rng & Units 11/25/2022    9:58 AM 12/16/2021    9:03 AM  PFT Results  FVC-Pre L 3.49  3.41   FVC-Predicted Pre % 84  82   FVC-Post L  3.46  3.47   FVC-Predicted Post % 84  83   Pre FEV1/FVC % % 89  86   Post FEV1/FCV % % 87  87   FEV1-Pre L 3.09  2.94   FEV1-Predicted Pre % 103  97   FEV1-Post L 2.99  3.00   DLCO uncorrected ml/min/mmHg 19.21  19.65   DLCO UNC% % 78  79   DLCO corrected ml/min/mmHg 19.21  20.64   DLCO COR %Predicted % 78  83   DLVA Predicted % 92  102   TLC L 5.69  5.79   TLC % Predicted % 83  84   RV % Predicted % 82  94     No results found for: "NITRICOXIDE"      Assessment & Plan:   Pulmonary hypertension (HCC) Moderately elevated PASP with right heart strain on echo. Group 3 r/t OSA and possibly component of Group 2? Recommend RHC for further evaluation. Will send referral to Dr. Shirlee Latch to set this up. I will treat him with lasix given his current symptoms of volume overload. Schedule for baseline. Follow up with Dr. Judeth Horn for [facility] consult.  Patient  Instructions  Continue Advair 1 puff Twice daily. Brush tongue and rinse mouth afterwards  Continue Albuterol inhaler 2 puffs every 6 hours as needed for shortness of breath or wheezing. Notify if symptoms persist despite rescue inhaler/neb use.  Continue BiPAP nightly. Adjust settings to auto set IPAP max 16, EPAP min 10 and PS 4.    Lasix 40 mg daily for 5 days. Take in AM with food  Come back next Thursday or Friday for repeat labs. Call the morning of to make sure our lab tech is here  Referral for right heart catheterization - someone will contact you to schedule this  6 minute walk test scheduled    Follow up with Dr. Judeth Horn in next available consult slot for pulmonary hypertension. If symptoms do not improve or worsen, please contact office for sooner follow up or seek emergency care.    Sleep apnea Excellent compliance and control. Receives benefit from use. Aware of proper use/care of device. Understands safe driving practices.   Asthma No formal obstruction on PFTs. He receives benefit from use of ICS/LABA. Minimal SABA use. Action plan in place  Allergic rhinitis Compensated on current regimen  Voice hoarseness Persistent voice hoarseness despite control of rhinitis symptoms. GERD controlled with PPI. Will refer to ENT for further evaluation.     I spent 42 minutes of dedicated to the care of this patient on the date of this encounter to include pre-visit review of records, face-to-face time with the patient discussing conditions above, post visit ordering of testing, clinical documentation with the electronic health record, making appropriate referrals as documented, and communicating necessary findings to members of the patients care team.  Noemi Chapel, NP 01/22/2023  Pt aware and understands NP's role.

## 2023-01-22 NOTE — Patient Instructions (Addendum)
Continue Advair 1 puff Twice daily. Brush tongue and rinse mouth afterwards  Continue Albuterol inhaler 2 puffs every 6 hours as needed for shortness of breath or wheezing. Notify if symptoms persist despite rescue inhaler/neb use.  Continue BiPAP nightly. Adjust settings to auto set IPAP max 16, EPAP min 10 and PS 4.    Lasix 40 mg daily for 5 days. Take in AM with food  Come back next Thursday or Friday for repeat labs. Call the morning of to make sure our lab tech is here  Referral for right heart catheterization - someone will contact you to schedule this  6 minute walk test scheduled    Follow up with Dr. Judeth Horn in next available consult slot for pulmonary hypertension. If symptoms do not improve or worsen, please contact office for sooner follow up or seek emergency care.

## 2023-01-22 NOTE — Assessment & Plan Note (Signed)
Persistent voice hoarseness despite control of rhinitis symptoms. GERD controlled with PPI. Will refer to ENT for further evaluation.

## 2023-01-25 ENCOUNTER — Telehealth (HOSPITAL_COMMUNITY): Payer: Self-pay

## 2023-01-25 NOTE — Telephone Encounter (Signed)
Left message for patient to call office about scheduling right heart catheterization.

## 2023-01-26 ENCOUNTER — Telehealth (HOSPITAL_COMMUNITY): Payer: Self-pay | Admitting: Cardiology

## 2023-01-26 ENCOUNTER — Encounter (HOSPITAL_COMMUNITY): Payer: Self-pay | Admitting: Cardiology

## 2023-01-26 DIAGNOSIS — I272 Pulmonary hypertension, unspecified: Secondary | ICD-10-CM

## 2023-01-26 NOTE — Telephone Encounter (Signed)
Patient returned call Ready to proceed with procedure Not available 8/22 or 8/24 Prefers AM appt

## 2023-01-26 NOTE — Addendum Note (Signed)
Addended by: , Milagros Reap on: 01/26/2023 10:51 AM   Modules accepted: Orders

## 2023-01-26 NOTE — Telephone Encounter (Signed)
Pt aware of details below Copy sent via [facility] [address] [place] [address] Dept: [phone] Loc: [phone]  Warren Weeks  01/26/2023  You are scheduled for a Cardiac Catheterization on Friday, August 16 with Dr. Marca Ancona.  1. Please arrive at the [facility] (Main Entrance A) at [facility]: [address] [place], [place] [address] at 3:00 PM (This time is 1.5 hour(s) before your procedure to ensure your preparation). Free valet parking service is available. You will check in at ADMITTING. The support person will be asked to wait in the waiting room.  It is OK to have someone drop you off and come back when you are ready to be discharged.    Special note: Every effort is made to have your procedure done on time. Please understand that emergencies sometimes delay scheduled procedures.  2. Diet: Do not eat solid foods after midnight.  The patient may have clear liquids until 5am upon the day of the procedure.  3. Labs: You will need to have blood drawn on Friday, August 9 at  845 am at [facility]. You do not need to be fasting.  4. Medication instructions in preparation for your procedure:   Contrast Allergy: No    Stop taking, Lasix (Furosemide)  Friday, August 16,    On the morning of your procedure, take your Aspirin 81 mg and any morning medicines NOT listed above.  You may use sips of water.  5. Plan to go home the same day, you will only stay overnight if medically necessary. 6. Bring a current list of your medications and current insurance cards. 7. You MUST have a responsible person to drive you home. 8. Someone MUST be with you the first 24 hours after you arrive home or your discharge will be delayed. 9. Please wear clothes that are easy to get on and off and wear slip-on shoes.  Thank you for allowing [place] to care for you!   --   Invasive Cardiovascular services

## 2023-01-27 ENCOUNTER — Other Ambulatory Visit (HOSPITAL_COMMUNITY): Payer: Self-pay

## 2023-01-27 DIAGNOSIS — I272 Pulmonary hypertension, unspecified: Secondary | ICD-10-CM

## 2023-01-28 DIAGNOSIS — G4733 Obstructive sleep apnea (adult) (pediatric): Secondary | ICD-10-CM | POA: Diagnosis not present

## 2023-01-29 ENCOUNTER — Ambulatory Visit (HOSPITAL_COMMUNITY)
Admission: RE | Admit: 2023-01-29 | Discharge: 2023-01-29 | Disposition: A | Discharge status: Home / Self Care | Payer: Medicare PPO | Source: Ambulatory Visit | Attending: Cardiology | Admitting: Cardiology

## 2023-01-29 DIAGNOSIS — R600 Localized edema: Secondary | ICD-10-CM | POA: Insufficient documentation

## 2023-01-29 DIAGNOSIS — I272 Pulmonary hypertension, unspecified: Secondary | ICD-10-CM

## 2023-01-29 LAB — CBC
HCT: 40.9 % (ref 39.0–52.0)
Hemoglobin: 13 g/dL (ref 13.0–17.0)
MCH: 29.5 pg (ref 26.0–34.0)
MCHC: 31.8 g/dL (ref 30.0–36.0)
MCV: 92.7 fL (ref 80.0–100.0)
Platelets: 149 10*3/uL — ABNORMAL LOW (ref 150–400)
RBC: 4.41 MIL/uL (ref 4.22–5.81)
RDW: 13.7 % (ref 11.5–15.5)
WBC: 5.3 10*3/uL (ref 4.0–10.5)
nRBC: 0 % (ref 0.0–0.2)

## 2023-01-29 LAB — BASIC METABOLIC PANEL
Anion gap: 9 (ref 5–15)
BUN: 16 mg/dL (ref 8–23)
CO2: 25 mmol/L (ref 22–32)
Calcium: 9.1 mg/dL (ref 8.9–10.3)
Chloride: 107 mmol/L (ref 98–111)
Creatinine, Ser: 1.18 mg/dL (ref 0.61–1.24)
GFR, Estimated: >60 mL/min (ref >60)
Glucose, Bld: 113 mg/dL — ABNORMAL HIGH (ref 70–99)
Potassium: 4.7 mmol/L (ref 3.5–5.1)
Sodium: 141 mmol/L (ref 135–145)

## 2023-02-03 ENCOUNTER — Telehealth (HOSPITAL_COMMUNITY): Payer: Self-pay

## 2023-02-03 NOTE — Telephone Encounter (Signed)
Attempted to call patient- left message to call back to reschedule RHC to Thursday afternoon instead due to provider being out of office.

## 2023-02-03 NOTE — Telephone Encounter (Signed)
Spoke with patient regarding procedure- rescheduled Right heart cath for tomorrow with Dr. Shirlee Latch- patient aware of appointment time and date. Patient aware of instructions. All questions answered.   Advised patient to call back to office with any issues, questions, or concerns. Patient verbalized understanding.

## 2023-02-04 ENCOUNTER — Encounter (HOSPITAL_COMMUNITY)
Admission: RE | Disposition: A | Discharge status: Home / Self Care | Payer: Self-pay | Source: Home / Self Care | Attending: Cardiology

## 2023-02-04 ENCOUNTER — Other Ambulatory Visit: Payer: Self-pay

## 2023-02-04 ENCOUNTER — Ambulatory Visit (HOSPITAL_COMMUNITY)
Admission: RE | Admit: 2023-02-04 | Discharge: 2023-02-04 | Disposition: A | Discharge status: Home / Self Care | Payer: Medicare PPO | Attending: Cardiology | Admitting: Cardiology

## 2023-02-04 DIAGNOSIS — I6523 Occlusion and stenosis of bilateral carotid arteries: Secondary | ICD-10-CM | POA: Insufficient documentation

## 2023-02-04 DIAGNOSIS — R0609 Other forms of dyspnea: Secondary | ICD-10-CM | POA: Diagnosis not present

## 2023-02-04 DIAGNOSIS — J45909 Unspecified asthma, uncomplicated: Secondary | ICD-10-CM | POA: Diagnosis not present

## 2023-02-04 DIAGNOSIS — Z79899 Other long term (current) drug therapy: Secondary | ICD-10-CM | POA: Diagnosis not present

## 2023-02-04 DIAGNOSIS — I272 Pulmonary hypertension, unspecified: Secondary | ICD-10-CM | POA: Insufficient documentation

## 2023-02-04 DIAGNOSIS — F419 Anxiety disorder, unspecified: Secondary | ICD-10-CM | POA: Insufficient documentation

## 2023-02-04 DIAGNOSIS — I110 Hypertensive heart disease with heart failure: Secondary | ICD-10-CM | POA: Diagnosis not present

## 2023-02-04 DIAGNOSIS — K219 Gastro-esophageal reflux disease without esophagitis: Secondary | ICD-10-CM | POA: Insufficient documentation

## 2023-02-04 DIAGNOSIS — I509 Heart failure, unspecified: Secondary | ICD-10-CM | POA: Insufficient documentation

## 2023-02-04 DIAGNOSIS — Z87891 Personal history of nicotine dependence: Secondary | ICD-10-CM | POA: Diagnosis not present

## 2023-02-04 DIAGNOSIS — G4733 Obstructive sleep apnea (adult) (pediatric): Secondary | ICD-10-CM | POA: Insufficient documentation

## 2023-02-04 DIAGNOSIS — I739 Peripheral vascular disease, unspecified: Secondary | ICD-10-CM | POA: Insufficient documentation

## 2023-02-04 DIAGNOSIS — E785 Hyperlipidemia, unspecified: Secondary | ICD-10-CM | POA: Insufficient documentation

## 2023-02-04 DIAGNOSIS — E291 Testicular hypofunction: Secondary | ICD-10-CM | POA: Diagnosis not present

## 2023-02-04 HISTORY — PX: RIGHT HEART CATH: CATH118263

## 2023-02-04 LAB — POCT I-STAT EG7
Acid-base deficit: 1 mmol/L (ref 0.0–2.0)
Acid-base deficit: 1 mmol/L (ref 0.0–2.0)
Bicarbonate: 23.8 mmol/L (ref 20.0–28.0)
Bicarbonate: 24 mmol/L (ref 20.0–28.0)
Calcium, Ion: 1.23 mmol/L (ref 1.15–1.40)
Calcium, Ion: 1.24 mmol/L (ref 1.15–1.40)
HCT: 37 % — ABNORMAL LOW (ref 39.0–52.0)
HCT: 37 % — ABNORMAL LOW (ref 39.0–52.0)
Hemoglobin: 12.6 g/dL — ABNORMAL LOW (ref 13.0–17.0)
Hemoglobin: 12.6 g/dL — ABNORMAL LOW (ref 13.0–17.0)
O2 Saturation: 70 %
O2 Saturation: 73 %
Potassium: 4.1 mmol/L (ref 3.5–5.1)
Potassium: 4.1 mmol/L (ref 3.5–5.1)
Sodium: 142 mmol/L (ref 135–145)
Sodium: 142 mmol/L (ref 135–145)
TCO2: 25 mmol/L (ref 22–32)
TCO2: 25 mmol/L (ref 22–32)
pCO2, Ven: 40.4 mmHg — ABNORMAL LOW (ref 44–60)
pCO2, Ven: 40.7 mmHg — ABNORMAL LOW (ref 44–60)
pH, Ven: 7.379 (ref 7.25–7.43)
pH, Ven: 7.38 (ref 7.25–7.43)
pO2, Ven: 38 mmHg (ref 32–45)
pO2, Ven: 40 mmHg (ref 32–45)

## 2023-02-04 SURGERY — RIGHT HEART CATH
Anesthesia: LOCAL

## 2023-02-04 MED ORDER — ACETAMINOPHEN 325 MG PO TABS: 650.0000 mg | ORAL_TABLET | ORAL | Status: DC | PRN

## 2023-02-04 MED ORDER — LIDOCAINE HCL (PF) 1 % IJ SOLN
INTRAMUSCULAR | Status: DC | PRN
  Administered 2023-02-04: 3 mL

## 2023-02-04 MED ORDER — SODIUM CHLORIDE 0.9 % IV SOLN: 250.0000 mL | INTRAVENOUS | Status: DC | PRN

## 2023-02-04 MED ORDER — FUROSEMIDE 40 MG PO TABS
40.0000 mg | ORAL_TABLET | Freq: Every day | ORAL | 3 refills | Status: DC
Start: 2023-02-04 — End: 2024-01-18

## 2023-02-04 MED ORDER — HYDRALAZINE HCL 20 MG/ML IJ SOLN: 10.0000 mg | INTRAMUSCULAR | Status: DC | PRN

## 2023-02-04 MED ORDER — LIDOCAINE HCL (PF) 1 % IJ SOLN
INTRAMUSCULAR | Status: AC
  Filled 2023-02-04: qty 30

## 2023-02-04 MED ORDER — SODIUM CHLORIDE 0.9% FLUSH: 3.0000 mL | Freq: Two times a day (BID) | INTRAVENOUS | Status: DC

## 2023-02-04 MED ORDER — SODIUM CHLORIDE 0.9 % IV SOLN: INTRAVENOUS | Status: DC

## 2023-02-04 MED ORDER — LABETALOL HCL 5 MG/ML IV SOLN: 10.0000 mg | INTRAVENOUS | Status: DC | PRN

## 2023-02-04 MED ORDER — ONDANSETRON HCL 4 MG/2ML IJ SOLN: 4.0000 mg | Freq: Four times a day (QID) | INTRAMUSCULAR | Status: DC | PRN

## 2023-02-04 MED ORDER — HEPARIN (PORCINE) IN NACL 1000-0.9 UT/500ML-% IV SOLN
INTRAVENOUS | Status: DC | PRN
  Administered 2023-02-04: 500 mL

## 2023-02-04 MED ORDER — SODIUM CHLORIDE 0.9% FLUSH: 3.0000 mL | INTRAVENOUS | Status: DC | PRN

## 2023-02-04 MED ORDER — POTASSIUM CHLORIDE CRYS ER 20 MEQ PO TBCR: 20.0000 meq | EXTENDED_RELEASE_TABLET | Freq: Every day | ORAL | 3 refills | Status: DC

## 2023-02-04 SURGICAL SUPPLY — 5 items
CATH BALLN WEDGE 5F 110CM (CATHETERS) IMPLANT
PACK CARDIAC CATHETERIZATION (CUSTOM PROCEDURE TRAY) ×1 IMPLANT
SHEATH GLIDE SLENDER 4/5FR (SHEATH) IMPLANT
TRANSDUCER W/STOPCOCK (MISCELLANEOUS) ×1 IMPLANT
TUBING ART PRESS 72 MALE/FEM (TUBING) IMPLANT

## 2023-02-04 NOTE — Discharge Instructions (Addendum)
1. Take furosemide 40 mg daily.  2. Take potassium 20 mEq daily.  Brachial Site Care   This sheet gives you information about how to care for yourself after your procedure. Your health care provider may also give you more specific instructions. If you have problems or questions, contact your health care provider. What can I expect after the procedure? After the procedure, it is common to have: Bruising and tenderness at the catheter insertion area. Follow these instructions at home:  Insertion site care Follow instructions from your health care provider about how to take care of your insertion site. Make sure you: Wash your hands with soap and water before you change your bandage (dressing). If soap and water are not available, use hand sanitizer. Remove your dressing as told by your health care provider. In 24 hours Check your insertion site every day for signs of infection. Check for: Redness, swelling, or pain. Pus or a bad smell. Warmth. You may shower 24-48 hours after the procedure. Do not apply powder or lotion to the site.  Activity For 24 hours after the procedure, or as directed by your health care provider: Do not push or pull heavy objects with the affected arm. Do not drive yourself home from the hospital or clinic. You may drive 24 hours after the procedure unless your health care provider tells you not to. Do not lift anything that is heavier than 10 lb (4.5 kg), or the limit that you are told, until your health care provider says that it is safe.  For 24 hours

## 2023-02-04 NOTE — Interval H&P Note (Signed)
History and Physical Interval Note:  02/04/2023 4:07 PM  Warren Weeks  has presented today for surgery, with the diagnosis of heart failure.  The various methods of treatment have been discussed with the patient and family. After consideration of risks, benefits and other options for treatment, the patient has consented to  Procedure(s): RIGHT HEART CATH (N/A) as a surgical intervention.  The patient's history has been reviewed, patient examined, no change in status, stable for surgery.  I have reviewed the patient's chart and labs.  Questions were answered to the patient's satisfaction.      [employer]

## 2023-02-05 ENCOUNTER — Encounter (HOSPITAL_COMMUNITY): Payer: Self-pay | Admitting: Cardiology

## 2023-02-11 DIAGNOSIS — R351 Nocturia: Secondary | ICD-10-CM | POA: Diagnosis not present

## 2023-02-11 DIAGNOSIS — E291 Testicular hypofunction: Secondary | ICD-10-CM | POA: Diagnosis not present

## 2023-02-11 DIAGNOSIS — N401 Enlarged prostate with lower urinary tract symptoms: Secondary | ICD-10-CM | POA: Diagnosis not present

## 2023-02-24 ENCOUNTER — Other Ambulatory Visit: Payer: Self-pay

## 2023-02-24 ENCOUNTER — Other Ambulatory Visit: Payer: Self-pay | Admitting: Internal Medicine

## 2023-02-25 ENCOUNTER — Other Ambulatory Visit: Payer: Self-pay | Admitting: Pulmonary Disease

## 2023-02-28 DIAGNOSIS — G4733 Obstructive sleep apnea (adult) (pediatric): Secondary | ICD-10-CM | POA: Diagnosis not present

## 2023-03-02 ENCOUNTER — Encounter: Payer: Self-pay | Admitting: Internal Medicine

## 2023-03-02 ENCOUNTER — Ambulatory Visit (AMBULATORY_SURGERY_CENTER): Payer: Medicare PPO | Admitting: *Deleted

## 2023-03-02 VITALS — Ht 69.0 in | Wt 250.0 lb

## 2023-03-02 DIAGNOSIS — Z86010 Personal history of colonic polyps: Secondary | ICD-10-CM

## 2023-03-02 MED ORDER — PEG 3350-KCL-NA BICARB-NACL 420 G PO SOLR
4000.0000 mL | Freq: Once | ORAL | 0 refills | Status: AC
Start: 2023-03-02 — End: 2023-03-02

## 2023-03-02 NOTE — Progress Notes (Signed)
No egg or soy allergy known to patient  No issues known to pt with past sedation with any surgeries or procedures Patient denies ever being told they had issues or difficulty with intubation  No FH of Malignant Hyperthermia Pt is not on diet pills Pt is not on  home 02  Pt is not on blood thinners  Pt denies issues with constipation,have bowel movement daily.  No A fib or A flutter Have any cardiac testing pending--go for 6 minute walk in ov Pt instructed to use Singlecare.com or GoodRx for a price reduction on prep

## 2023-03-05 ENCOUNTER — Encounter: Payer: Self-pay | Admitting: Nurse Practitioner

## 2023-03-05 ENCOUNTER — Ambulatory Visit: Payer: Medicare PPO | Admitting: Nurse Practitioner

## 2023-03-05 VITALS — BP 120/60 | HR 89 | Temp 97.3°F | Wt 238.8 lb

## 2023-03-05 DIAGNOSIS — U071 COVID-19: Secondary | ICD-10-CM

## 2023-03-05 LAB — POC COVID19 BINAXNOW: SARS Coronavirus 2 Ag: POSITIVE — AB

## 2023-03-05 MED ORDER — FLUTICASONE PROPIONATE 50 MCG/ACT NA SUSP
NASAL | 0 refills | Status: DC
Start: 2023-03-05 — End: 2023-09-30

## 2023-03-05 MED ORDER — PROMETHAZINE-DM 6.25-15 MG/5ML PO SYRP: 5.0000 mL | ORAL_SOLUTION | Freq: Three times a day (TID) | ORAL | 0 refills | Status: DC | PRN

## 2023-03-05 MED ORDER — NIRMATRELVIR/RITONAVIR (PAXLOVID)TABLET: 3.0000 | ORAL_TABLET | Freq: Two times a day (BID) | ORAL | 0 refills | Status: AC

## 2023-03-05 NOTE — Progress Notes (Signed)
Acute Office Visit  Subjective:    Patient ID: Warren Weeks, male    DOB: 09-Oct-1945, 77 y.o.   MRN: [id number]  Chief Complaint  Patient presents with   Sore Throat    Runny nose, sore throat and 101 fever. Symptoms started yesterday. He stated that 2 of his grand kids tested positive for Covid   URI  This is a new problem. The current episode started in the past 7 days. The problem has been unchanged. There has been no fever. Associated symptoms include congestion, coughing, headaches, joint pain, rhinorrhea, sinus pain and sneezing. Pertinent negatives include no abdominal pain, chest pain, diarrhea, dysuria, ear pain, joint swelling, nausea, neck pain, plugged ear sensation, rash, sore throat, swollen glands, vomiting or wheezing. He has tried acetaminophen (mucinex and benzonatate) for the symptoms. The treatment provided no relief.  No improvement with benzonatate and mucinex  Outpatient Medications Prior to Visit  Medication Sig   acetaminophen (TYLENOL) 650 MG CR tablet Take 650 mg by mouth at bedtime.   aspirin 81 MG EC tablet Take 1 tablet (81 mg total) by mouth daily. Swallow whole.   Cyanocobalamin (B-12 PO) Take 2,500 mcg by mouth daily.   diclofenac Sodium (VOLTAREN) 1 % GEL Apply 2 g topically daily.   Diclofenac Sodium CR 100 MG 24 hr tablet Take 1 tablet by mouth once daily (Patient taking differently: Take 100 mg by mouth daily as needed for pain.)   ezetimibe (ZETIA) 10 MG tablet Take 1 tablet by mouth once daily   fluticasone-salmeterol (ADVAIR) 250-50 MCG/ACT AEPB INHALE 1 DOSE BY MOUTH IN THE MORNING AND IN THE EVENING   furosemide (LASIX) 40 MG tablet Take 1 tablet (40 mg total) by mouth daily.   lisinopril (ZESTRIL) 20 MG tablet Take 1 tablet (20 mg total) by mouth daily.   Magnesium 250 MG TABS Take 250 mg by mouth daily.   Melatonin 10 MG TABS Take 10 mg by mouth at bedtime.    methocarbamol (ROBAXIN) 500 MG tablet Take 1 tablet (500 mg total) by mouth 2 (two)  times daily as needed.   metoprolol tartrate (LOPRESSOR) 50 MG tablet Take 1 tablet (50 mg total) by mouth 2 (two) times daily.   Multiple Vitamin (ONE-A-DAY 55 PLUS PO) Take 1 tablet by mouth daily. Men   Omega-3 Fatty Acids (FISH OIL) 1000 MG CAPS Take 1,000 mg by mouth daily.   omeprazole (PRILOSEC) 20 MG capsule Take 1 capsule by mouth twice daily   potassium chloride SA (KLOR-CON M) 20 MEQ tablet Take 1 tablet (20 mEq total) by mouth daily.   rosuvastatin (CRESTOR) 40 MG tablet Take 1 tablet by mouth once daily   sildenafil (VIAGRA) 100 MG tablet Take 1 tablet (100 mg total) by mouth as needed for erectile dysfunction.   terazosin (HYTRIN) 5 MG capsule Take 5 mg by mouth at bedtime.    Testosterone 20.25 MG/1.25GM (1.62%) GEL Apply 2 Packages topically daily. On each shoulder   thiamine 250 MG tablet Take 250 mg by mouth daily.   Turmeric 500 MG TABS Take 1,000 mg by mouth daily. ginger   [DISCONTINUED] fluticasone (FLONASE) 50 MCG/ACT nasal spray Use 2 spray(s) in each nostril once daily (Patient taking differently: Place 2 sprays into both nostrils daily as needed for rhinitis.)   [DISCONTINUED] mometasone (NASONEX) 50 MCG/ACT nasal spray 2 sprays daily.   solifenacin (VESICARE) 5 MG tablet Take 1 tablet (5 mg total) by mouth daily. (Patient not taking: Reported on 03/02/2023)  No facility-administered medications prior to visit.    Reviewed past medical and social history.  Review of Systems  HENT:  Positive for congestion, rhinorrhea, sinus pain and sneezing. Negative for ear pain and sore throat.   Respiratory:  Positive for cough. Negative for wheezing.   Cardiovascular:  Negative for chest pain.  Gastrointestinal:  Negative for abdominal pain, diarrhea, nausea and vomiting.  Genitourinary:  Negative for dysuria.  Musculoskeletal:  Positive for joint pain. Negative for neck pain.  Skin:  Negative for rash.  Neurological:  Positive for headaches.   Per HPI     Objective:     Physical Exam Vitals reviewed.  Constitutional:      General: He is not in acute distress. HENT:     Right Ear: Tympanic membrane, ear canal and external ear normal.     Left Ear: Tympanic membrane, ear canal and external ear normal.     Nose: Congestion and rhinorrhea present. No nasal tenderness or mucosal edema.     Right Nostril: No occlusion.     Left Nostril: No occlusion.     Right Turbinates: Not enlarged, swollen or pale.     Left Turbinates: Not enlarged, swollen or pale.     Right Sinus: No maxillary sinus tenderness or frontal sinus tenderness.     Left Sinus: No maxillary sinus tenderness or frontal sinus tenderness.     Mouth/Throat:     Pharynx: Oropharynx is clear. Uvula midline. Posterior oropharyngeal erythema present. No pharyngeal swelling, oropharyngeal exudate or uvula swelling.     Tonsils: No tonsillar exudate or tonsillar abscesses.  Eyes:     Extraocular Movements: Extraocular movements intact.     Conjunctiva/sclera: Conjunctivae normal.  Cardiovascular:     Rate and Rhythm: Normal rate and regular rhythm.     Pulses: Normal pulses.     Heart sounds: Normal heart sounds.  Pulmonary:     Effort: Pulmonary effort is normal.     Breath sounds: Normal breath sounds.  Musculoskeletal:     Cervical back: Normal range of motion and neck supple.  Lymphadenopathy:     Cervical: No cervical adenopathy.  Neurological:     Mental Status: He is alert and oriented to person, place, and time.     BP 120/60   Pulse 89   Temp (!) 97.3 F (36.3 C) (Temporal)   Wt 238 lb 12.8 oz (108.3 kg)   SpO2 96%   BMI 35.26 kg/m    Results for orders placed or performed in visit on 03/05/23  POC COVID-19 BinaxNow  Result Value Ref Range   SARS Coronavirus 2 Ag Positive (A) Negative      Assessment & Plan:   Problem List Items Addressed This Visit   None Visit Diagnoses     COVID-19    -  Primary   Relevant Medications   nirmatrelvir/ritonavir (PAXLOVID) 20 x  150 MG & 10 x 100MG  TABS   fluticasone (FLONASE) 50 MCG/ACT nasal spray   promethazine-dextromethorphan (PROMETHAZINE-DM) 6.25-15 MG/5ML syrup   Other Relevant Orders   POC COVID-19 BinaxNow (Completed)      Meds ordered this encounter  Medications   nirmatrelvir/ritonavir (PAXLOVID) 20 x 150 MG & 10 x 100MG  TABS    Sig: Take 3 tablets by mouth 2 (two) times daily for 5 days. (Take nirmatrelvir 150 mg two tablets twice daily for 5 days and ritonavir 100 mg one tablet twice daily for 5 days) Patient GFR is 60    Dispense:  30 tablet    Refill:  0    Order Specific Question:   Supervising Provider    Answer:   Nadene Rubins ALFRED [[id number]]   fluticasone (FLONASE) 50 MCG/ACT nasal spray    Sig: USE 2 SPRAY(S) IN EACH NOSTRIL ONCE DAILY    Dispense:  16 g    Refill:  0    Order Specific Question:   Supervising Provider    Answer:   Mliss Sax [[id number]]   promethazine-dextromethorphan (PROMETHAZINE-DM) 6.25-15 MG/5ML syrup    Sig: Take 5 mLs by mouth 3 (three) times daily as needed for cough.    Dispense:  118 mL    Refill:  0    Order Specific Question:   Supervising Provider    Answer:   Nadene Rubins ALFRED [[id number]]   Advised to Hold rosuvastatin, Vesicare, and Advair while taking paxlovid. Avoid decongestants if you have high blood pressure. Encourage adequate oral hydration. Use mucinex DM or Robitussin  or delsym for cough without congestion  You can use plain "Tylenol" or "Advil" for fever, chills and achyness. Use cool mist humidifier at bedtime to help with nasal congestion and cough. F/up with pcp id no improvement in 1week ED precautions provided.  Return if symptoms worsen or fail to improve.    Alysia Penna, NP

## 2023-03-05 NOTE — Patient Instructions (Addendum)
Hold rosuvastatin, Vesicare, and Advair while taking paxlovid. Avoid decongestants if you have high blood pressure. Encourage adequate oral hydration. Use mucinex DM or Robitussin  or delsym for cough without congestion  You can use plain "Tylenol" or "Advil" for fever, chills and achyness. Use cool mist humidifier at bedtime to help with nasal congestion and cough.  Cold/cough medications may have tylenol or ibuprofen or guaifenesin or dextromethophan in them, so be careful not to take beyond the recommended dose for each of these medications.  COVID-19 COVID-19 is an infection caused by a virus called SARS-CoV-2. This type of virus is called a coronavirus. People with COVID-19 may: Have little to no symptoms. Have mild to moderate symptoms that affect their lungs and breathing. Get very sick. What are the causes? COVID-19 is caused by a virus. This virus may be in the air as droplets or on surfaces. It can spread from an infected person when they cough, sneeze, speak, sing, or breathe. You may become infected if: You breathe in the infected droplets in the air. You touch an object that has the virus on it. What increases the risk? You are at risk of getting COVID-19 if you have been around someone with the infection. You may be more likely to get very sick if: You are 55 years old or older. You have certain medical conditions, such as: Heart disease. Diabetes. Chronic respiratory disease. Cancer. Pregnancy. You are immunocompromised. This means your body cannot fight infections easily. You have a disability or trouble moving, meaning you're immobile. What are the signs or symptoms? People may have different symptoms from COVID-19. The symptoms can also be mild to severe. They often show up in 5-6 days after being infected. But they can take up to 14 days to appear. Common symptoms are: Cough. Feeling tired. New loss of taste or smell. Fever. Less common symptoms are: Sore  throat. Headache. Body or muscle aches. Diarrhea. A skin rash or odd-colored fingers or toes. Red or irritated eyes. Sometimes, COVID-19 does not cause symptoms. How is this diagnosed? COVID-19 can be diagnosed with tests done in the lab or at home. Fluid from your nose, mouth, or lungs will be used to check for the virus. How is this treated? Treatment for COVID-19 depends on how sick you are. Mild symptoms can be treated at home with rest, fluids, and over-the-counter medicines. Severe symptoms may be treated in a hospital intensive care unit (ICU). If you have symptoms and are at risk of getting very sick, you may be given a medicine that fights viruses. This medicine is called an antiviral. How is this prevented? To protect yourself from COVID-19: Know your risk factors. Get vaccinated. If your body cannot fight infections easily, talk to your provider about treatment to help prevent COVID-19. Stay at least 1 meter away from others. Wear a well-fitted mask when: You can't stay at a distance from people. You're in a place with poor air flow. Try to be in open spaces with good air flow when in public. Wash your hands often or use an alcohol-based hand sanitizer. Cover your nose and mouth when coughing and sneezing. If you think you have COVID-19 or have been around someone who has it, stay home and be by yourself for 5-10 days. Where to find more information Centers for Disease Control and Prevention (CDC): [employer].no World Health Organization [facility]): VisitDestination.com.br Get help right away if: You have trouble breathing or get short of breath. You have pain or pressure in  your chest. You cannot speak or move any part of your body. You are confused. Your symptoms get worse. These symptoms may be an emergency. Get help right away. Call 911. Do not wait to see if the symptoms will go away. Do not drive yourself to the hospital. This information is not intended to replace advice given to you by  your health care provider. Make sure you discuss any questions you have with your health care provider. Document Revised: 06/16/2022 Document Reviewed: 02/20/2022 Elsevier Patient Education  2024 [employer].

## 2023-03-09 ENCOUNTER — Telehealth: Payer: Self-pay | Admitting: Internal Medicine

## 2023-03-09 NOTE — Telephone Encounter (Signed)
Inbound call from patient states he tested positive for Covid last Friday. Patient also started a new medication called " paxlovid". Requesting a f/u call from a nurse to discuss. Please advise.

## 2023-03-09 NOTE — Telephone Encounter (Signed)
Pt tested positive for Covid on Friday 03/05/23. Per policy, procedure cannot be completed until 10 days after testing positive. Procedure rescheduled for Thursday 03/18/23. New instructions sent to pt via MyChart.

## 2023-03-09 NOTE — Progress Notes (Signed)
Attempted to edit letter. Unable. Will create new letter.

## 2023-03-11 ENCOUNTER — Encounter: Payer: Self-pay | Admitting: Internal Medicine

## 2023-03-11 ENCOUNTER — Ambulatory Visit: Payer: Medicare PPO | Admitting: Internal Medicine

## 2023-03-11 ENCOUNTER — Encounter: Payer: Medicare PPO | Admitting: Internal Medicine

## 2023-03-11 VITALS — BP 124/70 | HR 88 | Temp 98.0°F | Ht 69.0 in | Wt 236.0 lb

## 2023-03-11 DIAGNOSIS — I272 Pulmonary hypertension, unspecified: Secondary | ICD-10-CM | POA: Diagnosis not present

## 2023-03-11 DIAGNOSIS — E559 Vitamin D deficiency, unspecified: Secondary | ICD-10-CM

## 2023-03-11 DIAGNOSIS — R7302 Impaired glucose tolerance (oral): Secondary | ICD-10-CM

## 2023-03-11 DIAGNOSIS — N1831 Chronic kidney disease, stage 3a: Secondary | ICD-10-CM | POA: Diagnosis not present

## 2023-03-11 DIAGNOSIS — I10 Essential (primary) hypertension: Secondary | ICD-10-CM

## 2023-03-11 DIAGNOSIS — E7800 Pure hypercholesterolemia, unspecified: Secondary | ICD-10-CM | POA: Diagnosis not present

## 2023-03-11 DIAGNOSIS — N189 Chronic kidney disease, unspecified: Secondary | ICD-10-CM | POA: Insufficient documentation

## 2023-03-11 LAB — HEPATIC FUNCTION PANEL
ALT: 39 U/L (ref 0–53)
AST: 35 U/L (ref 0–37)
Albumin: 3.6 g/dL (ref 3.5–5.2)
Alkaline Phosphatase: 137 U/L — ABNORMAL HIGH (ref 39–117)
Bilirubin, Direct: 0 mg/dL (ref 0.0–0.3)
Total Bilirubin: 0.3 mg/dL (ref 0.2–1.2)
Total Protein: 8 g/dL (ref 6.0–8.3)

## 2023-03-11 LAB — VITAMIN D 25 HYDROXY (VIT D DEFICIENCY, FRACTURES): VITD: 32.11 ng/mL (ref 30.00–100.00)

## 2023-03-11 LAB — LIPID PANEL
Cholesterol: 175 mg/dL (ref 0–200)
HDL: 26.2 mg/dL — ABNORMAL LOW (ref >39.00)
LDL Cholesterol: 104 mg/dL — ABNORMAL HIGH (ref 0–99)
NonHDL: 148.5
Total CHOL/HDL Ratio: 7
Triglycerides: 224 mg/dL — ABNORMAL HIGH (ref 0.0–149.0)
VLDL: 44.8 mg/dL — ABNORMAL HIGH (ref 0.0–40.0)

## 2023-03-11 LAB — BASIC METABOLIC PANEL
BUN: 28 mg/dL — ABNORMAL HIGH (ref 6–23)
CO2: 24 mEq/L (ref 19–32)
Calcium: 9.3 mg/dL (ref 8.4–10.5)
Chloride: 108 mEq/L (ref 96–112)
Creatinine, Ser: 1.29 mg/dL (ref 0.40–1.50)
GFR: 54.52 mL/min — ABNORMAL LOW (ref >60.00)
Glucose, Bld: 127 mg/dL — ABNORMAL HIGH (ref 70–99)
Potassium: 4.2 mEq/L (ref 3.5–5.1)
Sodium: 140 mEq/L (ref 135–145)

## 2023-03-11 LAB — HEMOGLOBIN A1C: Hgb A1c MFr Bld: 6.9 % — ABNORMAL HIGH (ref 4.6–6.5)

## 2023-03-11 NOTE — Progress Notes (Signed)
Patient ID: Warren Weeks, male   DOB: 10/01/1945, 77 y.o.   MRN: [id number]        Chief Complaint: follow up low vit d, ckd3a, low b12, pulm htn with leg swelling, recent covid infection       HPI:  Warren Weeks is a 77 y.o. male here overall doing ok.  Pt denies chest pain, increased sob or doe, wheezing, orthopnea, PND, increased LE swelling, palpitations, dizziness or syncope, in fact leg swelling has essentially resolved with diuresis and wt loss.   Had covid infection last wk and finished the paxlovid and tolerated well.  Still with cough thick white sputum, sometimes grayish, and nasal clear and yellowish drainage.  He believes came from the grandchildren when he picked them up.  Will have colonoscopy next week.  Lost 10 lbs or so in the last 6 mo, not sure why.  Had covid infection last wk but states appetite down prior to that, at least for 1 mo.  Quit smoking over 30 yrs ago.  Did have HCT changed to lasix per cardiology with resolved leg swelling.   Wt Readings from Last 3 Encounters:  03/11/23 236 lb (107 kg)  03/05/23 238 lb 12.8 oz (108.3 kg)  03/02/23 250 lb (113.4 kg)   BP Readings from Last 3 Encounters:  03/11/23 124/70  03/05/23 120/60  02/04/23 (!) 166/72         Past Medical History:  Diagnosis Date   ALLERGIC RHINITIS 09/16/2007   ALLERGY 03/24/2007   Allergy    ANXIETY 03/28/2007   Arthritis    knees, back,shoulder   CAROTID ARTERY STENOSIS, BILATERAL 07/01/2009   yearly checks - no current problems   Cataract    bilateral,removed   CEPHALGIA 06/08/2009   CHEST PAIN 09/16/2007   no current problems   COLONIC POLYPS, HX OF 09/16/2007   COPD (chronic obstructive pulmonary disease) (HCC) 05/12/2017   mild per patient - no inhaler   Dizziness and giddiness 06/08/2009   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   DYSPNEA/SHORTNESS OF BREATH 09/16/2007   ERECTILE DYSFUNCTION 03/28/2007   ESOPHAGEAL STRICTURE 09/16/2007   ESOPHAGITIS 03/24/2007   GERD 03/24/2007    GLUCOSE INTOLERANCE 09/16/2007   HEMORRHOIDS, INTERNAL 03/24/2007   HYPERCHOLESTEROLEMIA 03/24/2007   HYPERLIPIDEMIA 09/16/2007   HYPERTENSION 03/28/2007   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/2011   Impaired glucose tolerance 10/04/2010   LOW BACK PAIN 03/28/2007   MAGNETIC RESONANCE IMAGING, BRAIN, ABNORMAL 06/26/2009   NECK PAIN, LEFT 06/20/2010   OBESITY 03/24/2007   OTITIS MEDIA, LEFT 06/10/2009   Palpitations 06/20/2010   no current problems   PVD 06/25/2009   RASH-NONVESICULAR 06/17/2009   Sleep apnea 12/25/2021   URI 08/16/2009   Past Surgical History:  Procedure Laterality Date   COLONOSCOPY  03/2013   polyps/[place]   LEFT HEART CATH AND CORONARY ANGIOGRAPHY N/A 10/14/2021   Procedure: LEFT HEART CATH AND CORONARY ANGIOGRAPHY;  Surgeon: Corky Crafts, MD;  Location: [facility];  Service: Cardiovascular;  Laterality: N/A;   NASAL SEPTOPLASTY W/ TURBINOPLASTY Bilateral 01/03/2021   Procedure: NASAL SEPTOPLASTY WITH BILATERAL TURBINATE REDUCTION;  Surgeon: Newman Pies, MD;  Location: [facility];  Service: ENT;  Laterality: Bilateral;   RIGHT HEART CATH N/A 02/04/2023   Procedure: RIGHT HEART CATH;  Surgeon: Laurey Morale, MD;  Location: [facility] INVASIVE CV LAB;  Service: Cardiovascular;  Laterality: N/A;   s/p right knee arthroscopy     UPPER GASTROINTESTINAL ENDOSCOPY     gerd  reports that he quit smoking about 31 years ago. His smoking use included cigarettes. He started smoking about 46 years ago. He has a 7.5 pack-year smoking history. He has never used smokeless tobacco. He reports current alcohol use of about 7.0 standard drinks of alcohol per week. He reports that he does not use drugs. family history includes Coronary artery disease in his brother and sister; Heart disease in his father; Lung cancer in his sister; Lymphoma in his mother; Ovarian cancer in his sister. Allergies  Allergen Reactions   Cefuroxime Axetil Rash   Current Outpatient Medications  on File Prior to Visit  Medication Sig Dispense Refill   acetaminophen (TYLENOL) 650 MG CR tablet Take 650 mg by mouth at bedtime.     aspirin 81 MG EC tablet Take 1 tablet (81 mg total) by mouth daily. Swallow whole. 30 tablet 12   Cyanocobalamin (B-12 PO) Take 2,500 mcg by mouth daily.     diclofenac Sodium (VOLTAREN) 1 % GEL Apply 2 g topically daily.     Diclofenac Sodium CR 100 MG 24 hr tablet Take 1 tablet by mouth once daily (Patient taking differently: Take 100 mg by mouth daily as needed for pain.) 90 tablet 1   ezetimibe (ZETIA) 10 MG tablet Take 1 tablet by mouth once daily 90 tablet 3   fluticasone (FLONASE) 50 MCG/ACT nasal spray USE 2 SPRAY(S) IN EACH NOSTRIL ONCE DAILY 16 g 0   fluticasone-salmeterol (ADVAIR) 250-50 MCG/ACT AEPB INHALE 1 DOSE BY MOUTH IN THE MORNING AND IN THE EVENING 60 each 0   furosemide (LASIX) 40 MG tablet Take 1 tablet (40 mg total) by mouth daily. 60 tablet 3   lisinopril (ZESTRIL) 20 MG tablet Take 1 tablet (20 mg total) by mouth daily. 90 tablet 3   Magnesium 250 MG TABS Take 250 mg by mouth daily.     Melatonin 10 MG TABS Take 10 mg by mouth at bedtime.      methocarbamol (ROBAXIN) 500 MG tablet Take 1 tablet (500 mg total) by mouth 2 (two) times daily as needed. 60 tablet 2   metoprolol tartrate (LOPRESSOR) 50 MG tablet Take 1 tablet (50 mg total) by mouth 2 (two) times daily. 180 tablet 1   Multiple Vitamin (ONE-A-DAY 55 PLUS PO) Take 1 tablet by mouth daily. Men     Omega-3 Fatty Acids (FISH OIL) 1000 MG CAPS Take 1,000 mg by mouth daily.     omeprazole (PRILOSEC) 20 MG capsule Take 1 capsule by mouth twice daily 180 capsule 3   polyethylene glycol-electrolytes (NULYTELY) 420 g solution See admin instructions.     potassium chloride SA (KLOR-CON M) 20 MEQ tablet Take 1 tablet (20 mEq total) by mouth daily. 60 tablet 3   promethazine-dextromethorphan (PROMETHAZINE-DM) 6.25-15 MG/5ML syrup Take 5 mLs by mouth 3 (three) times daily as needed for cough.  118 mL 0   rosuvastatin (CRESTOR) 40 MG tablet Take 1 tablet by mouth once daily 90 tablet 3   sildenafil (VIAGRA) 100 MG tablet Take 1 tablet (100 mg total) by mouth as needed for erectile dysfunction. 10 tablet 11   solifenacin (VESICARE) 5 MG tablet Take 1 tablet (5 mg total) by mouth daily. 90 tablet 3   terazosin (HYTRIN) 5 MG capsule Take 5 mg by mouth at bedtime.      Testosterone 20.25 MG/1.25GM (1.62%) GEL Apply 2 Packages topically daily. On each shoulder     thiamine 250 MG tablet Take 250 mg by mouth daily.  Turmeric 500 MG TABS Take 1,000 mg by mouth daily. ginger     No current facility-administered medications on file prior to visit.        ROS:  All others reviewed and negative.  Objective        PE:  BP 124/70 (BP Location: Right Arm, Patient Position: Sitting, Cuff Size: Normal)   Pulse 88   Temp 98 F (36.7 C) (Oral)   Ht 5\' 9"  (1.753 m)   Wt 236 lb (107 kg)   SpO2 94%   BMI 34.85 kg/m                 Constitutional: Pt appears in NAD               HENT: Head: NCAT.                Right Ear: External ear normal.                 Left Ear: External ear normal.                Eyes: . Pupils are equal, round, and reactive to light. Conjunctivae and EOM are normal               Nose: without d/c or deformity               Neck: Neck supple. Gross normal ROM               Cardiovascular: Normal rate and regular rhythm.                 Pulmonary/Chest: Effort normal and breath sounds without rales or wheezing.                Abd:  Soft, NT, ND, + BS, no organomegaly               Neurological: Pt is alert. At baseline orientation, motor grossly intact               Skin: Skin is warm. No rashes, no other new lesions, LE edema - none               Psychiatric: Pt behavior is normal without agitation   Micro: none  Cardiac tracings I have personally interpreted today:  none  Pertinent Radiological findings (summarize): none   Lab Results  Component Value  Date   WBC 5.3 01/29/2023   HGB 12.6 (L) 02/04/2023   HGB 12.6 (L) 02/04/2023   HCT 37.0 (L) 02/04/2023   HCT 37.0 (L) 02/04/2023   PLT 149 (L) 01/29/2023   GLUCOSE 127 (H) 03/11/2023   CHOL 175 03/11/2023   TRIG 224.0 (H) 03/11/2023   HDL 26.20 (L) 03/11/2023   LDLDIRECT 176.0 04/03/2011   LDLCALC 104 (H) 03/11/2023   ALT 39 03/11/2023   AST 35 03/11/2023   NA 140 03/11/2023   K 4.2 03/11/2023   CL 108 03/11/2023   CREATININE 1.29 03/11/2023   BUN 28 (H) 03/11/2023   CO2 24 03/11/2023   TSH 1.26 10/06/2022   PSA 0.10 09/17/2021   HGBA1C 6.9 (H) 03/11/2023   MICROALBUR <0.7 10/06/2022   Assessment/Plan:  Marisela Saltarelli Xia is a 77 y.o. Black or African American [2] male with  has a past medical history of ALLERGIC RHINITIS (09/16/2007), ALLERGY (03/24/2007), Allergy, ANXIETY (03/28/2007), Arthritis, CAROTID ARTERY STENOSIS, BILATERAL (07/01/2009), Cataract, CEPHALGIA (06/08/2009), CHEST PAIN (09/16/2007), COLONIC POLYPS, HX OF (09/16/2007), COPD (chronic obstructive  pulmonary disease) (HCC) (05/12/2017), Dizziness and giddiness (06/08/2009), Dyshidrotic eczema (06/03/2018), DYSPNEA/SHORTNESS OF BREATH (09/16/2007), ERECTILE DYSFUNCTION (03/28/2007), ESOPHAGEAL STRICTURE (09/16/2007), ESOPHAGITIS (03/24/2007), GERD (03/24/2007), GLUCOSE INTOLERANCE (09/16/2007), HEMORRHOIDS, INTERNAL (03/24/2007), HYPERCHOLESTEROLEMIA (03/24/2007), HYPERLIPIDEMIA (09/16/2007), HYPERTENSION (03/28/2007), Hypogonadism male (10/09/2011), Impaired glucose tolerance (10/04/2010), LOW BACK PAIN (03/28/2007), MAGNETIC RESONANCE IMAGING, BRAIN, ABNORMAL (06/26/2009), NECK PAIN, LEFT (06/20/2010), OBESITY (03/24/2007), OTITIS MEDIA, LEFT (06/10/2009), Palpitations (06/20/2010), PVD (06/25/2009), RASH-NONVESICULAR (06/17/2009), Sleep apnea (12/25/2021), and URI (08/16/2009).  CKD (chronic kidney disease) Lab Results  Component Value Date   CREATININE 1.29 03/11/2023   Stable overall, cont to avoid  nephrotoxins   Essential hypertension BP Readings from Last 3 Encounters:  03/11/23 124/70  03/05/23 120/60  02/04/23 (!) 166/72   Stable, pt to continue medical treatment lisinopril 20 every day, lopressor 50 bid   Hyperlipidemia Lab Results  Component Value Date   LDLCALC 104 (H) 03/11/2023   Uncontrolled, pt to continue zetia 10 every day, crestor 40 every day and improved lower chol diet   Impaired glucose tolerance Lab Results  Component Value Date   HGBA1C 6.9 (H) 03/11/2023   Stable, pt to continue current medical treatment  - diet, wt control   Pulmonary hypertension (HCC) With improved peripheral edema - cont lasix  Vitamin D deficiency Last vitamin D Lab Results  Component Value Date   VD25OH 32.11 03/11/2023   Low, to start oral replacement  Followup: Return in about 6 months (around 09/08/2023).  Oliver Barre, MD 03/13/2023 2:29 PM [facility]

## 2023-03-11 NOTE — Patient Instructions (Signed)
Please take OTC Vitamin D3 at 2000 units per day, indefinitely  Please continue all other medications as before, and refills have been done if requested.  Please have the pharmacy call with any other refills you may need.  Please continue your efforts at being more active, low cholesterol diet, and weight control.  Please keep your appointments with your specialists as you may have planned  Please go to the LAB at the blood drawing area for the tests to be done  You will be contacted by phone if any changes need to be made immediately.  Otherwise, you will receive a letter about your results with an explanation, but please check with MyChart first.  Please make an Appointment to return in 6 months, or sooner if needed  Ok to cancel the Oct appt

## 2023-03-11 NOTE — Progress Notes (Signed)
The test results show that your current treatment is OK, as the tests are stable.  Please continue the same plan.  There is no other need for change of treatment or further evaluation based on these results, at this time.  thanks

## 2023-03-13 ENCOUNTER — Encounter: Payer: Self-pay | Admitting: Internal Medicine

## 2023-03-13 DIAGNOSIS — E559 Vitamin D deficiency, unspecified: Secondary | ICD-10-CM | POA: Insufficient documentation

## 2023-03-13 NOTE — Assessment & Plan Note (Signed)
Lab Results  Component Value Date   CREATININE 1.29 03/11/2023   Stable overall, cont to avoid nephrotoxins

## 2023-03-13 NOTE — Assessment & Plan Note (Signed)
BP Readings from Last 3 Encounters:  03/11/23 124/70  03/05/23 120/60  02/04/23 (!) 166/72   Stable, pt to continue medical treatment lisinopril 20 every day, lopressor 50 bid

## 2023-03-13 NOTE — Assessment & Plan Note (Signed)
Lab Results  Component Value Date   LDLCALC 104 (H) 03/11/2023   Uncontrolled, pt to continue zetia 10 every day, crestor 40 every day and improved lower chol diet

## 2023-03-13 NOTE — Assessment & Plan Note (Signed)
With improved peripheral edema - cont lasix

## 2023-03-13 NOTE — Assessment & Plan Note (Signed)
Lab Results  Component Value Date   HGBA1C 6.9 (H) 03/11/2023   Stable, pt to continue current medical treatment  - diet, wt control

## 2023-03-13 NOTE — Assessment & Plan Note (Signed)
Last vitamin D Lab Results  Component Value Date   VD25OH 32.11 03/11/2023   Low, to start oral replacement

## 2023-03-14 ENCOUNTER — Other Ambulatory Visit: Payer: Self-pay | Admitting: Internal Medicine

## 2023-03-15 ENCOUNTER — Other Ambulatory Visit: Payer: Self-pay

## 2023-03-16 ENCOUNTER — Ambulatory Visit: Payer: Medicare PPO | Admitting: Internal Medicine

## 2023-03-17 NOTE — Telephone Encounter (Signed)
Patient called requesting to speak with a nurse, stated he ate a hot dog yesterday at 7:00 pm and some slaw. Procedure is scheduled for tomorrow. 03/18/23. Please advise.

## 2023-03-18 ENCOUNTER — Encounter: Payer: Self-pay | Admitting: Internal Medicine

## 2023-03-18 ENCOUNTER — Ambulatory Visit (AMBULATORY_SURGERY_CENTER): Payer: Medicare PPO | Admitting: Internal Medicine

## 2023-03-18 VITALS — BP 127/91 | HR 70 | Temp 97.9°F | Resp 9 | Ht 69.0 in | Wt 250.0 lb

## 2023-03-18 DIAGNOSIS — Z86010 Personal history of colonic polyps: Secondary | ICD-10-CM

## 2023-03-18 DIAGNOSIS — Z09 Encounter for follow-up examination after completed treatment for conditions other than malignant neoplasm: Secondary | ICD-10-CM | POA: Diagnosis not present

## 2023-03-18 DIAGNOSIS — Z1211 Encounter for screening for malignant neoplasm of colon: Secondary | ICD-10-CM | POA: Diagnosis not present

## 2023-03-18 DIAGNOSIS — G4733 Obstructive sleep apnea (adult) (pediatric): Secondary | ICD-10-CM | POA: Diagnosis not present

## 2023-03-18 DIAGNOSIS — J449 Chronic obstructive pulmonary disease, unspecified: Secondary | ICD-10-CM | POA: Diagnosis not present

## 2023-03-18 NOTE — Patient Instructions (Addendum)
-   Repeat colonoscopy is not recommended for Surveillance. - Resume previous diet. - Continue present medications.  YOU HAD AN ENDOSCOPIC PROCEDURE TODAY AT THE [facility]:   Refer to the procedure report that was given to you for any specific questions about what was found during the examination.  If the procedure report does not answer your questions, please call your gastroenterologist to clarify.  If you requested that your care partner not be given the details of your procedure findings, then the procedure report has been included in a sealed envelope for you to review at your convenience later.  YOU SHOULD EXPECT: Some feelings of bloating in the abdomen. Passage of more gas than usual.  Walking can help get rid of the air that was put into your GI tract during the procedure and reduce the bloating. If you had a lower endoscopy (such as a colonoscopy or flexible sigmoidoscopy) you may notice spotting of blood in your stool or on the toilet paper. If you underwent a bowel prep for your procedure, you may not have a normal bowel movement for a few days.  Please Note:  You might notice some irritation and congestion in your nose or some drainage.  This is from the oxygen used during your procedure.  There is no need for concern and it should clear up in a day or so.  SYMPTOMS TO REPORT IMMEDIATELY:  Following lower endoscopy (colonoscopy or flexible sigmoidoscopy):  Excessive amounts of blood in the stool  Significant tenderness or worsening of abdominal pains  Swelling of the abdomen that is new, acute  Fever of 100F or higher  For urgent or emergent issues, a gastroenterologist can be reached at any hour by calling [phone]. Do not use MyChart messaging for urgent concerns.    DIET:  We do recommend a small meal at first, but then you may proceed to your regular diet.  Drink plenty of fluids but you should avoid alcoholic beverages for 24 hours.  ACTIVITY:  You should  plan to take it easy for the rest of today and you should NOT DRIVE or use heavy machinery until tomorrow (because of the sedation medicines used during the test).    FOLLOW UP: Our staff will call the number listed on your records the next business day following your procedure.  We will call around 7:15- 8:00 am to check on you and address any questions or concerns that you may have regarding the information given to you following your procedure. If we do not reach you, we will leave a message.     If any biopsies were taken you will be contacted by phone or by letter within the next 1-3 weeks.  Please call us at [phone] if you have not heard about the biopsies in 3 weeks.    SIGNATURES/CONFIDENTIALITY: You and/or your care partner have signed paperwork which will be entered into your electronic medical record.  These signatures attest to the fact that that the information above on your After Visit Summary has been reviewed and is understood.  Full responsibility of the confidentiality of this discharge information lies with you and/or your care-partner.

## 2023-03-18 NOTE — Op Note (Signed)
[facility] Patient Name: Warren Weeks Procedure Date: 03/18/2023 2:56 PM MRN: [id number] Endoscopist: Wilhemina Bonito. Marina Goodell , MD, [id number] Age: 77 Referring MD:  Date of Birth: 04-25-1946 Gender: Male Account #: [id number] Procedure:                Colonoscopy Indications:              High risk colon cancer surveillance: Personal                            history of multiple (3 or more) adenomas. Prior                            2006, 09, 14, 19 Medicines:                Monitored Anesthesia Care Procedure:                Pre-Anesthesia Assessment:                           - Prior to the procedure, a History and Physical                            was performed, and patient medications and                            allergies were reviewed. The patient's tolerance of                            previous anesthesia was also reviewed. The risks                            and benefits of the procedure and the sedation                            options and risks were discussed with the patient.                            All questions were answered, and informed consent                            was obtained. Prior Anticoagulants: The patient has                            taken no anticoagulant or antiplatelet agents. ASA                            Grade Assessment: II - A patient with mild systemic                            disease. After reviewing the risks and benefits,                            the patient was deemed in satisfactory condition to  undergo the procedure.                           After obtaining informed consent, the colonoscope                            was passed under direct vision. Throughout the                            procedure, the patient's blood pressure, pulse, and                            oxygen saturations were monitored continuously. The                            CF [id number] #[id number] was introduced through the anus                             and advanced to the the cecum, identified by                            appendiceal orifice and ileocecal valve. The                            ileocecal valve, appendiceal orifice, and rectum                            were photographed. The quality of the bowel                            preparation was excellent. The colonoscopy was                            performed without difficulty. The patient tolerated                            the procedure well. The bowel preparation used was                            SUPREP via split dose instruction. Scope In: 3:17:36 PM Scope Out: 3:30:22 PM Scope Withdrawal Time: 0 hours 11 minutes 10 seconds  Total Procedure Duration: 0 hours 12 minutes 46 seconds  Findings:                 Multiple diverticula were found in the left colon.                           Internal hemorrhoids were found during retroflexion.                           The exam was otherwise without abnormality on                            direct and retroflexion views. Complications:  No immediate complications. Estimated blood loss:                            None. Estimated Blood Loss:     Estimated blood loss: none. Impression:               - Diverticulosis in the left colon.                           - Internal hemorrhoids.                           - The examination was otherwise normal on direct                            and retroflexion views.                           - No specimens collected. Recommendation:           - Repeat colonoscopy is not recommended for                            surveillance.                           - Patient has a contact number available for                            emergencies. The signs and symptoms of potential                            delayed complications were discussed with the                            patient. Return to normal activities tomorrow.                            Written  discharge instructions were provided to the                            patient.                           - Resume previous diet.                           - Continue present medications. Wilhemina Bonito. Marina Goodell, MD 03/18/2023 3:39:05 PM This report has been signed electronically.

## 2023-03-18 NOTE — Progress Notes (Signed)
Pt's states no medical or surgical changes since previsit or office visit. 

## 2023-03-18 NOTE — Progress Notes (Signed)
Report to PACU, RN, vss, BBS= Clear.  

## 2023-03-18 NOTE — Progress Notes (Signed)
HISTORY OF PRESENT ILLNESS:  Warren Weeks is a 77 y.o. male with a history of multiple adenomas.  Multiple prior colonoscopies.  Now for surveillance  REVIEW OF SYSTEMS:  All non-GI ROS negative except for  Past Medical History:  Diagnosis Date   ALLERGIC RHINITIS 09/16/2007   ALLERGY 03/24/2007   Allergy    ANXIETY 03/28/2007   Arthritis    knees, back,shoulder   CAROTID ARTERY STENOSIS, BILATERAL 07/01/2009   yearly checks - no current problems   Cataract    bilateral,removed   CEPHALGIA 06/08/2009   CHEST PAIN 09/16/2007   no current problems   COLONIC POLYPS, HX OF 09/16/2007   COPD (chronic obstructive pulmonary disease) (HCC) 05/12/2017   mild per patient - no inhaler   Dizziness and giddiness 06/08/2009   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   DYSPNEA/SHORTNESS OF BREATH 09/16/2007   ERECTILE DYSFUNCTION 03/28/2007   ESOPHAGEAL STRICTURE 09/16/2007   ESOPHAGITIS 03/24/2007   GERD 03/24/2007   GLUCOSE INTOLERANCE 09/16/2007   HEMORRHOIDS, INTERNAL 03/24/2007   HYPERCHOLESTEROLEMIA 03/24/2007   HYPERLIPIDEMIA 09/16/2007   HYPERTENSION 03/28/2007   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/2011   Impaired glucose tolerance 10/04/2010   LOW BACK PAIN 03/28/2007   MAGNETIC RESONANCE IMAGING, BRAIN, ABNORMAL 06/26/2009   NECK PAIN, LEFT 06/20/2010   OBESITY 03/24/2007   OTITIS MEDIA, LEFT 06/10/2009   Palpitations 06/20/2010   no current problems   PVD 06/25/2009   RASH-NONVESICULAR 06/17/2009   Sleep apnea 12/25/2021   URI 08/16/2009    Past Surgical History:  Procedure Laterality Date   COLONOSCOPY  03/2013   polyps/Malijah Lietz   LEFT HEART CATH AND CORONARY ANGIOGRAPHY N/A 10/14/2021   Procedure: LEFT HEART CATH AND CORONARY ANGIOGRAPHY;  Surgeon: Corky Crafts, MD;  Location: [facility];  Service: Cardiovascular;  Laterality: N/A;   NASAL SEPTOPLASTY W/ TURBINOPLASTY Bilateral 01/03/2021   Procedure: NASAL SEPTOPLASTY WITH BILATERAL TURBINATE REDUCTION;  Surgeon: Newman Pies, MD;  Location: [facility];  Service: ENT;  Laterality: Bilateral;   RIGHT HEART CATH N/A 02/04/2023   Procedure: RIGHT HEART CATH;  Surgeon: Laurey Morale, MD;  Location: [facility] INVASIVE CV LAB;  Service: Cardiovascular;  Laterality: N/A;   s/p right knee arthroscopy     UPPER GASTROINTESTINAL ENDOSCOPY     gerd    Social History Corbitt L Winburn  reports that he quit smoking about 31 years ago. His smoking use included cigarettes. He started smoking about 46 years ago. He has a 7.5 pack-year smoking history. He has never used smokeless tobacco. He reports current alcohol use of about 7.0 standard drinks of alcohol per week. He reports that he does not use drugs.  family history includes Coronary artery disease in his brother and sister; Heart disease in his father; Lung cancer in his sister; Lymphoma in his mother; Ovarian cancer in his sister.  Allergies  Allergen Reactions   Cefuroxime Axetil Rash       PHYSICAL EXAMINATION:  Vital signs: BP (!) 164/77   Pulse 62   Temp 97.9 F (36.6 C)   Resp 12   Ht 5\' 9"  (1.753 m)   Wt 250 lb (113.4 kg)   SpO2 98%   BMI 36.92 kg/m  General: Well-developed, well-nourished, no acute distress HEENT: Sclerae are anicteric, conjunctiva pink. Oral mucosa intact Lungs: Clear Heart: Regular Abdomen: soft, nontender, nondistended, no obvious ascites, no peritoneal signs, normal bowel sounds. No organomegaly. Extremities: No edema Psychiatric: alert and oriented x3. Cooperative    ASSESSMENT:  History of multiple adenomatous colon polyps   PLAN:  Surveillance colonoscopy

## 2023-03-19 ENCOUNTER — Telehealth: Payer: Self-pay | Admitting: *Deleted

## 2023-03-19 NOTE — Telephone Encounter (Signed)
Post procedure follow up phone call. No answer at number given.  Left message on voicemail.  

## 2023-03-24 ENCOUNTER — Encounter: Payer: Self-pay | Admitting: Pulmonary Disease

## 2023-03-24 ENCOUNTER — Ambulatory Visit (INDEPENDENT_AMBULATORY_CARE_PROVIDER_SITE_OTHER): Payer: Medicare PPO | Admitting: Pulmonary Disease

## 2023-03-24 ENCOUNTER — Ambulatory Visit: Payer: Medicare PPO | Admitting: Pulmonary Disease

## 2023-03-24 VITALS — BP 134/66 | HR 62 | Ht 69.0 in | Wt 235.0 lb

## 2023-03-24 DIAGNOSIS — I272 Pulmonary hypertension, unspecified: Secondary | ICD-10-CM | POA: Diagnosis not present

## 2023-03-24 DIAGNOSIS — G4733 Obstructive sleep apnea (adult) (pediatric): Secondary | ICD-10-CM | POA: Diagnosis not present

## 2023-03-24 LAB — BASIC METABOLIC PANEL
BUN: 20 mg/dL (ref 6–23)
CO2: 27 meq/L (ref 19–32)
Calcium: 9.5 mg/dL (ref 8.4–10.5)
Chloride: 108 meq/L (ref 96–112)
Creatinine, Ser: 1.11 mg/dL (ref 0.40–1.50)
GFR: 65.28 mL/min (ref >60.00)
Glucose, Bld: 106 mg/dL — ABNORMAL HIGH (ref 70–99)
Potassium: 4.3 meq/L (ref 3.5–5.1)
Sodium: 142 meq/L (ref 135–145)

## 2023-03-24 NOTE — Progress Notes (Signed)
@Patient  ID: Warren Weeks, male    DOB: 08-31-1945, 77 y.o.   MRN: [id number]  Chief Complaint  Patient presents with   Follow-up    Pt is here for 6 min Walk and F/U visit.    Referring provider: Corwin Levins, MD  HPI:   77 y.o. man whom we are seeing for evaluation of pulmonary hypertension.  Multiple PCP notes reviewed.  Multiple pulmonary notes reviewed, patient of Dr. Aldean Ast with OSA on CPAP.  Most recent pulmonary note from Rhunette Croft, NP reviewed.  Patient presents today to clinic for discussion of pulmonary hypertension.  OSA on CPAP.  Some shortness of breath.  Echocardiogram was obtained 12/2022.  This demonstrated dilated left atrium, MVR, grade 1 diastolic dysfunction, signs of elevated PASP.  He was started on Lasix given lower extremity edema.  Right heart catheterization was arranged.  This was performed 01/2023 with a mean PA pressure of 25, wedge of 17, preserved cardiac output and index with a PVR of 1.2 Wood units.  He has been adherent to his Lasix.  Weight down about 9 pounds.  He feels like lower extremities are less swollen.  There is still some edema on exam.  Reviewed recent labs with mild increase in creatinine 1.2 from baseline 1-1.1.  He is fatigued throughout the day.  Tired.  Has musculoskeletal issues unable to exercise.  Compliance report for OSA on CPAP reviewed today, excellent adherence and well treated AHI 0.5.  6-minute walk today with 771 m walked desaturate 97% to 95% but no need for oxygen, normal blood pressure response, normal heart rate response.  We discussed at length the role and rationale for vasodilators.  How it is unlikely to help him.  Discussed etiologies of pulmonary hypertension etc.   Questionaires / Pulmonary Flowsheets:   ACT:      No data to display          MMRC:     No data to display          Epworth:     10/17/2021    8:00 AM  Results of the Epworth flowsheet  Sitting and reading 0  Watching TV 3  Sitting,  inactive in a public place (e.g. a theatre or a meeting) 0  As a passenger in a car for an hour without a break 2  Lying down to rest in the afternoon when circumstances permit 0  Sitting and talking to someone 0  Sitting quietly after a lunch without alcohol 3  In a car, while stopped for a few minutes in traffic 0  Total score 8    Tests:   FENO:  No results found for: "NITRICOXIDE"  PFT:    Latest Ref Rng & Units 11/25/2022    9:58 AM 12/16/2021    9:03 AM  PFT Results  FVC-Pre L 3.49  3.41   FVC-Predicted Pre % 84  82   FVC-Post L 3.46  3.47   FVC-Predicted Post % 84  83   Pre FEV1/FVC % % 89  86   Post FEV1/FCV % % 87  87   FEV1-Pre L 3.09  2.94   FEV1-Predicted Pre % 103  97   FEV1-Post L 2.99  3.00   DLCO uncorrected ml/min/mmHg 19.21  19.65   DLCO UNC% % 78  79   DLCO corrected ml/min/mmHg 19.21  20.64   DLCO COR %Predicted % 78  83   DLVA Predicted % 92  102   TLC  L 5.69  5.79   TLC % Predicted % 83  84   RV % Predicted % 82  94   Personally interpreted as normal spirometry, no bronchodilator response, lung branch is in normal limits, DLCO mildly reduced  WALK:     03/24/2023    9:28 AM  SIX MIN WALK  Medications Asprin 81 mg, Voltaren Gel, Diclofenac Sodium 100mg  CR, Lisinoprin 20 mg, Metoprolol Tartrate 50 mg, Omeprazole 20 mg, Testosterone 20.25 mg/1.25 gm gel, Thiamine 250 mg  Supplimental Oxygen during Test? (L/min) No  Laps 21  Partial Lap (in Meters) 3 meters  Baseline BP (sitting) 128/64  Baseline Heartrate 68  Baseline Dyspnea (Borg Scale) 0  Baseline Fatigue (Borg Scale) 0.5  Baseline SPO2 97 %  BP (sitting) 164/74  Heartrate 100  Dyspnea (Borg Scale) 1  Fatigue (Borg Scale) 0  SPO2 95 %  BP (sitting) 126/66  Heartrate 62  SPO2 98 %  Stopped or Paused before Six Minutes No  Interpretation Angina;Hip pain;Leg pain  Distance Completed 717 meters  Distance Completed 3 meters  Tech Comments: Patient walked at an average pace with no stops  for sob, fatigue or pain.  Tolerated walk well.    Imaging: Personally reviewed and as per EMR discussion this note No results found.  Lab Results: Personally reviewed CBC    Component Value Date/Time   WBC 5.3 01/29/2023 0902   RBC 4.41 01/29/2023 0902   HGB 12.6 (L) 02/04/2023 1644   HGB 12.6 (L) 02/04/2023 1644   HCT 37.0 (L) 02/04/2023 1644   HCT 37.0 (L) 02/04/2023 1644   PLT 149 (L) 01/29/2023 0902   MCV 92.7 01/29/2023 0902   MCH 29.5 01/29/2023 0902   MCHC 31.8 01/29/2023 0902   RDW 13.7 01/29/2023 0902   LYMPHSABS 1.2 10/06/2022 1639   MONOABS 0.5 10/06/2022 1639   EOSABS 0.1 10/06/2022 1639   BASOSABS 0.1 10/06/2022 1639    BMET    Component Value Date/Time   NA 140 03/11/2023 1527   K 4.2 03/11/2023 1527   CL 108 03/11/2023 1527   CO2 24 03/11/2023 1527   GLUCOSE 127 (H) 03/11/2023 1527   BUN 28 (H) 03/11/2023 1527   CREATININE 1.29 03/11/2023 1527   CALCIUM 9.3 03/11/2023 1527   GFRNONAA >60 01/29/2023 0902   GFRAA >60 02/14/2020 1720    BNP    Component Value Date/Time   BNP 160.4 (H) 06/22/2022 1401    ProBNP    Component Value Date/Time   PROBNP 260.0 (H) 12/09/2022 1602    Specialty Problems       Pulmonary Problems   Allergic rhinitis    Qualifier: Diagnosis of  By: Jonny Ruiz MD, Len Blalock       DOE (dyspnea on exertion)    Qualifier: Diagnosis of  By: Jonny Ruiz MD, Len Blalock       Cough   Wheezing   Pleurisy   Chronic sinusitis   Acute sinus infection   Loud snoring   Sleep apnea   Asthma    Allergies  Allergen Reactions   Cefuroxime Axetil Rash    Immunization History  Administered Date(s) Administered   Fluad Quad(high Dose 65+) 02/21/2019, 03/12/2021   H1N1 04/22/2008   Influenza Whole 06/10/2009   Influenza, High Dose Seasonal PF 03/20/2016, 03/16/2017, 03/16/2018   Influenza,inj,Quad PF,6+ Mos 04/10/2015   Influenza-Unspecified 03/22/2012, 09/03/2014, 04/22/2020, 04/06/2022   PFIZER(Purple Top)SARS-COV-2  Vaccination 07/24/2019, 08/21/2019, 06/24/2020   Pneumococcal Conjugate-13 12/12/2013   Pneumococcal Polysaccharide-23 12/14/2014  RSV,unspecified 04/06/2022   Td 10/03/2008   Tdap 12/07/2018   Zoster Recombinant(Shingrix) 09/24/2017, 12/20/2017   Zoster, Live 10/12/2012    Past Medical History:  Diagnosis Date   ALLERGIC RHINITIS 09/16/2007   ALLERGY 03/24/2007   Allergy    ANXIETY 03/28/2007   Arthritis    knees, back,shoulder   CAROTID ARTERY STENOSIS, BILATERAL 07/01/2009   yearly checks - no current problems   Cataract    bilateral,removed   CEPHALGIA 06/08/2009   CHEST PAIN 09/16/2007   no current problems   COLONIC POLYPS, HX OF 09/16/2007   COPD (chronic obstructive pulmonary disease) (HCC) 05/12/2017   mild per patient - no inhaler   Dizziness and giddiness 06/08/2009   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   DYSPNEA/SHORTNESS OF BREATH 09/16/2007   ERECTILE DYSFUNCTION 03/28/2007   ESOPHAGEAL STRICTURE 09/16/2007   ESOPHAGITIS 03/24/2007   GERD 03/24/2007   GLUCOSE INTOLERANCE 09/16/2007   HEMORRHOIDS, INTERNAL 03/24/2007   HYPERCHOLESTEROLEMIA 03/24/2007   HYPERLIPIDEMIA 09/16/2007   HYPERTENSION 03/28/2007   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/2011   Impaired glucose tolerance 10/04/2010   LOW BACK PAIN 03/28/2007   MAGNETIC RESONANCE IMAGING, BRAIN, ABNORMAL 06/26/2009   NECK PAIN, LEFT 06/20/2010   OBESITY 03/24/2007   OTITIS MEDIA, LEFT 06/10/2009   Palpitations 06/20/2010   no current problems   PVD 06/25/2009   RASH-NONVESICULAR 06/17/2009   Sleep apnea 12/25/2021   URI 08/16/2009    Tobacco History: Social History   Tobacco Use  Smoking Status Former   Current packs/day: 0.00   Average packs/day: 0.5 packs/day for 15.0 years (7.5 ttl pk-yrs)   Types: Cigarettes   Start date: 06/22/1976   Quit date: 06/23/1991   Years since quitting: 31.7  Smokeless Tobacco Never  Tobacco Comments   Quit 30 years ago   Counseling given: Not Answered Tobacco comments:  Quit 30 years ago   Continue to not smoke  Outpatient Encounter Medications as of 03/24/2023  Medication Sig   acetaminophen (TYLENOL) 650 MG CR tablet Take 650 mg by mouth at bedtime.   aspirin 81 MG EC tablet Take 1 tablet (81 mg total) by mouth daily. Swallow whole.   Cyanocobalamin (B-12 PO) Take 2,500 mcg by mouth daily.   diclofenac Sodium (VOLTAREN) 1 % GEL Apply 2 g topically daily.   Diclofenac Sodium CR 100 MG 24 hr tablet Take 1 tablet by mouth once daily (Patient taking differently: Take 100 mg by mouth daily as needed for pain.)   ezetimibe (ZETIA) 10 MG tablet Take 1 tablet by mouth once daily   fluticasone (FLONASE) 50 MCG/ACT nasal spray USE 2 SPRAY(S) IN EACH NOSTRIL ONCE DAILY   fluticasone-salmeterol (ADVAIR) 250-50 MCG/ACT AEPB INHALE 1 DOSE BY MOUTH IN THE MORNING AND IN THE EVENING   furosemide (LASIX) 40 MG tablet Take 1 tablet (40 mg total) by mouth daily.   lisinopril (ZESTRIL) 20 MG tablet Take 1 tablet (20 mg total) by mouth daily.   Magnesium 250 MG TABS Take 250 mg by mouth daily.   Melatonin 10 MG TABS Take 10 mg by mouth at bedtime.    methocarbamol (ROBAXIN) 500 MG tablet Take 1 tablet (500 mg total) by mouth 2 (two) times daily as needed.   metoprolol tartrate (LOPRESSOR) 50 MG tablet Take 1 tablet by mouth twice daily   Multiple Vitamin (ONE-A-DAY 55 PLUS PO) Take 1 tablet by mouth daily. Men   Omega-3 Fatty Acids (FISH OIL) 1000 MG CAPS Take 1,000 mg by mouth daily.   omeprazole (PRILOSEC) 20 MG  capsule Take 1 capsule by mouth twice daily   potassium chloride SA (KLOR-CON M) 20 MEQ tablet Take 1 tablet (20 mEq total) by mouth daily.   rosuvastatin (CRESTOR) 40 MG tablet Take 1 tablet by mouth once daily   sildenafil (VIAGRA) 100 MG tablet Take 1 tablet (100 mg total) by mouth as needed for erectile dysfunction.   solifenacin (VESICARE) 5 MG tablet Take 1 tablet (5 mg total) by mouth daily.   terazosin (HYTRIN) 5 MG capsule Take 5 mg by mouth at bedtime.     Testosterone 20.25 MG/1.25GM (1.62%) GEL Apply 2 Packages topically daily. On each shoulder   thiamine 250 MG tablet Take 250 mg by mouth daily.   Turmeric 500 MG TABS Take 1,000 mg by mouth daily. ginger   [DISCONTINUED] polyethylene glycol-electrolytes (NULYTELY) 420 g solution See admin instructions. (Patient not taking: Reported on 03/18/2023)   [DISCONTINUED] promethazine-dextromethorphan (PROMETHAZINE-DM) 6.25-15 MG/5ML syrup Take 5 mLs by mouth 3 (three) times daily as needed for cough. (Patient not taking: Reported on 03/24/2023)   No facility-administered encounter medications on file as of 03/24/2023.     Review of Systems  Review of Systems  No chest pain with exertion.  No orthopnea or PND.  Comprehensive review of systems otherwise negative. Physical Exam  BP 134/66 (BP Location: Left Arm, Cuff Size: Large)   Pulse 62   Ht 5\' 9"  (1.753 m)   Wt 235 lb (106.6 kg)   SpO2 97%   BMI 34.70 kg/m   Wt Readings from Last 5 Encounters:  03/24/23 235 lb (106.6 kg)  03/18/23 250 lb (113.4 kg)  03/11/23 236 lb (107 kg)  03/05/23 238 lb 12.8 oz (108.3 kg)  03/02/23 250 lb (113.4 kg)    BMI Readings from Last 5 Encounters:  03/24/23 34.70 kg/m  03/18/23 36.92 kg/m  03/11/23 34.85 kg/m  03/05/23 35.26 kg/m  03/02/23 36.92 kg/m     Physical Exam General: Sitting in chair, no acute distress Eyes: EOMI, no icterus Neck: Supple, JVP appreciated sitting upright Pulmonary: Clear, normal work of breathing Cardiovascular: Warm, 1+ edema to midshin bilaterally    Assessment & Plan:   Pulmonary hypertension: WHO group 2 disease with NYHA class II symptoms.  Elevated pulmonary capillary wedge with PVR about 1.2.  Previous echocardiogram with dilated left atrium, diastolic dysfunction, MVR.  Discussed at length do not think he would improve with pulm vasodilators given low PVR and high likelihood for decompensation and worsening symptoms development of pulmonary edema etc.  with pulmonary vasodilators given left-sided structural disease.  Stressed importance of low-salt diet.  Adherence to Lasix.  Reveal 2.0 score 4, low risk.  Repeat labs today to keep an eye on kidney function mild bump at last check 3 weeks ago, repeat pro BNP now on Lasix.  OSA on CPAP: Review of compliance report reveals excellent adherence, 100% greater than 8 hours every night.  AHI 0.5.  Excellent control.  He is having significant clinical benefit from ongoing CPAP use.   Return in about 6 months (around 09/22/2023).  With Dr. Judeth Horn or Dr. Lennox Grumbles, MD 03/24/2023   This appointment required 41 minutes of patient care (this includes precharting, chart review, review of results, face-to-face care, etc.).

## 2023-03-24 NOTE — Patient Instructions (Addendum)
Nice to meet you  We will get labs today  No changes to medication  If we need to change anything I will be in touch and let you know  Please be mindful of sodium or salt intake.  Goal is 2 grams (2000 mg) or less a day.  Return to clinic in 6 months or sooner as needed with Dr. Judeth Horn or Dr. Aldean Ast whoever you prefer

## 2023-03-24 NOTE — Progress Notes (Signed)
Six Minute Walk - 03/24/23 0928       Six Minute Walk   Medications taken before test (dose and time) Asprin 81 mg, Voltaren Gel, Diclofenac Sodium 100mg  CR, Lisinoprin 20 mg, Metoprolol Tartrate 50 mg, Omeprazole 20 mg, Testosterone 20.25 mg/1.25 gm gel, Thiamine 250 mg    Supplemental oxygen during test? No    Lap distance in meters  34 meters    Laps Completed 21    Partial lap (in meters) 3 meters    Baseline BP (sitting) 128/64    Baseline Heartrate 68    Baseline Dyspnea (Borg Scale) 0    Baseline Fatigue (Borg Scale) 0.5    Baseline SPO2 97 %      End of Test Values    BP (sitting) 164/74    Heartrate 100    Dyspnea (Borg Scale) 1    Fatigue (Borg Scale) 0    SPO2 95 %      2 Minutes Post Walk Values   BP (sitting) 126/66    Heartrate 62    SPO2 98 %    Stopped or paused before six minutes? No    Other Symptoms at end of exercise: Angina;Hip pain;Leg pain      Interpretation   Distance completed 717 meters    Tech Comments: Patient walked at an average pace with no stops for sob, fatigue or pain.  Tolerated walk well.              Patient seen and examined and relevant ancillary tests reviewed.  I agree with the assessment a as outlined by Joline Maxcy, RN.   Interpretation:  At rest, blood pressure 120/64, heart rate 68, oxygen saturation 97% on room air.  Patient ambulated 717 m on flat surface.  There is no BM significant oxygen desaturation requiring supplemental oxygen.  We can prescribe normal increase in blood pressure.  Normal increase in heart rate.  Abnormal decrease in oxygen saturation to 95%.  After resting at the end of the test blood pressure 126/66, heart rate 62, oxygen saturation 98% on room air.  Test is abnormal given mild oxygen desaturation that did not require supplemental oxygen.  Vilma Meckel, MD  ICU Physician [facility]  See Amion for pager info  03/24/2023, 1:04 PM

## 2023-03-25 LAB — PRO B NATRIURETIC PEPTIDE: NT-Pro BNP: 565 pg/mL — ABNORMAL HIGH (ref 0–376)

## 2023-03-28 ENCOUNTER — Other Ambulatory Visit: Payer: Self-pay | Admitting: Pulmonary Disease

## 2023-03-30 DIAGNOSIS — G4733 Obstructive sleep apnea (adult) (pediatric): Secondary | ICD-10-CM | POA: Diagnosis not present

## 2023-04-02 DIAGNOSIS — R490 Dysphonia: Secondary | ICD-10-CM | POA: Diagnosis not present

## 2023-04-02 DIAGNOSIS — J309 Allergic rhinitis, unspecified: Secondary | ICD-10-CM | POA: Diagnosis not present

## 2023-04-02 DIAGNOSIS — J387 Other diseases of larynx: Secondary | ICD-10-CM | POA: Diagnosis not present

## 2023-04-02 DIAGNOSIS — R0982 Postnasal drip: Secondary | ICD-10-CM | POA: Diagnosis not present

## 2023-04-02 DIAGNOSIS — K219 Gastro-esophageal reflux disease without esophagitis: Secondary | ICD-10-CM | POA: Diagnosis not present

## 2023-04-07 ENCOUNTER — Ambulatory Visit: Payer: Medicare PPO | Admitting: Internal Medicine

## 2023-04-19 ENCOUNTER — Other Ambulatory Visit: Payer: Self-pay | Admitting: Internal Medicine

## 2023-04-20 ENCOUNTER — Other Ambulatory Visit: Payer: Self-pay

## 2023-04-27 NOTE — Therapy (Unsigned)
OUTPATIENT SPEECH LANGUAGE PATHOLOGY VOICE EVALUATION   Patient Name: Warren Weeks MRN: [id number] DOB:Feb 14, 1946, 77 y.o., male Today's Date: 04/28/2023  PCP: Corwin Levins MD REFERRING PROVIDER: Laren Boom, DO  END OF SESSION:  End of Session - 04/28/23 1111     Visit Number 1    Number of Visits 9    Date for SLP Re-Evaluation 06/23/23    Authorization Type [employer] $20 copay    SLP Start Time 0930    SLP Stop Time  1015    SLP Time Calculation (min) 45 min    Activity Tolerance Patient tolerated treatment well             Past Medical History:  Diagnosis Date   ALLERGIC RHINITIS 09/16/2007   ALLERGY 03/24/2007   Allergy    ANXIETY 03/28/2007   Arthritis    knees, back,shoulder   CAROTID ARTERY STENOSIS, BILATERAL 07/01/2009   yearly checks - no current problems   Cataract    bilateral,removed   CEPHALGIA 06/08/2009   CHEST PAIN 09/16/2007   no current problems   COLONIC POLYPS, HX OF 09/16/2007   COPD (chronic obstructive pulmonary disease) (HCC) 05/12/2017   mild per patient - no inhaler   Dizziness and giddiness 06/08/2009   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   DYSPNEA/SHORTNESS OF BREATH 09/16/2007   ERECTILE DYSFUNCTION 03/28/2007   ESOPHAGEAL STRICTURE 09/16/2007   ESOPHAGITIS 03/24/2007   GERD 03/24/2007   GLUCOSE INTOLERANCE 09/16/2007   HEMORRHOIDS, INTERNAL 03/24/2007   HYPERCHOLESTEROLEMIA 03/24/2007   HYPERLIPIDEMIA 09/16/2007   HYPERTENSION 03/28/2007   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/2011   Impaired glucose tolerance 10/04/2010   LOW BACK PAIN 03/28/2007   MAGNETIC RESONANCE IMAGING, BRAIN, ABNORMAL 06/26/2009   NECK PAIN, LEFT 06/20/2010   OBESITY 03/24/2007   OTITIS MEDIA, LEFT 06/10/2009   Palpitations 06/20/2010   no current problems   PVD 06/25/2009   RASH-NONVESICULAR 06/17/2009   Sleep apnea 12/25/2021   URI 08/16/2009   Past Surgical History:  Procedure Laterality Date   COLONOSCOPY  03/2013   polyps/[place]   LEFT HEART  CATH AND CORONARY ANGIOGRAPHY N/A 10/14/2021   Procedure: LEFT HEART CATH AND CORONARY ANGIOGRAPHY;  Surgeon: Corky Crafts, MD;  Location: [facility];  Service: Cardiovascular;  Laterality: N/A;   NASAL SEPTOPLASTY W/ TURBINOPLASTY Bilateral 01/03/2021   Procedure: NASAL SEPTOPLASTY WITH BILATERAL TURBINATE REDUCTION;  Surgeon: Newman Pies, MD;  Location: [facility];  Service: ENT;  Laterality: Bilateral;   RIGHT HEART CATH N/A 02/04/2023   Procedure: RIGHT HEART CATH;  Surgeon: Laurey Morale, MD;  Location: [facility];  Service: Cardiovascular;  Laterality: N/A;   s/p right knee arthroscopy     UPPER GASTROINTESTINAL ENDOSCOPY     gerd   Patient Active Problem List   Diagnosis Date Noted   Vitamin D deficiency 03/13/2023   CKD (chronic kidney disease) 03/11/2023   Pulmonary hypertension (HCC) 01/22/2023   Asthma 12/16/2022   Right flank pain 09/01/2022   Thrush 07/03/2022   Left leg pain 02/08/2022   Voice hoarseness 02/08/2022   Cramp in lower leg 02/08/2022   Left hand pain 02/08/2022   Sleep apnea 12/25/2021   Loud snoring 10/17/2021   Coronary artery calcification    Degenerative joint disease of cervical spine 03/12/2021   Acute sinus infection 12/25/2020   Iliotibial band syndrome affecting left lower leg 09/08/2020   Neck pain on right side 09/05/2020   OAB (overactive bladder) 07/05/2020   Pain and  swelling of right lower leg 07/05/2020   Cervicalgia 07/05/2020   Right knee pain 07/05/2020   PVC's (premature ventricular contractions) 03/02/2020   BPH (benign prostatic hyperplasia) 03/02/2020   Arthritis 03/02/2020   Diastolic dysfunction 09/14/2019   Urinary frequency 09/14/2019   Urinary leakage 12/07/2018   Left knee pain 12/07/2018   Chronic sinusitis 10/05/2018   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   Pleurisy 03/16/2017   Bilateral leg pain 11/22/2016   Insomnia 10/13/2016   Cough 07/22/2016   Wheezing 07/22/2016   Peripheral  edema 02/21/2016   Venous insufficiency 02/05/2016   Low back pain radiating to lower extremity 02/05/2016   Vertigo    Hypersomnolence 10/24/2015   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   Chest pain 01/18/2013   Hematochezia 10/12/2012   Viral illness 05/26/2012   Palpitations 01/05/2012   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/2011   Fatigue 04/09/2011   Rash 12/22/2010   Encounter for long-term (current) use of high-risk medication 10/08/2010   Impaired glucose tolerance 10/04/2010   Encounter for well adult exam with abnormal findings 10/04/2010   Carotid artery stenosis 07/01/2009   PVD 06/25/2009   Allergic rhinitis 09/16/2007   ESOPHAGEAL STRICTURE 09/16/2007   DOE (dyspnea on exertion) 09/16/2007   History of colonic polyps 09/16/2007   Anxiety state 03/28/2007   ERECTILE DYSFUNCTION 03/28/2007   Hyperlipidemia 03/28/2007   Essential hypertension 03/28/2007   LOW BACK PAIN 03/28/2007   OBESITY 03/24/2007   Internal hemorrhoids 03/24/2007   Esophagitis 03/24/2007   GERD 03/24/2007    Onset date: 04/13/2023 (referral date)  REFERRING DIAG: R49.0 (ICD-10-CM) - Dysphonia  THERAPY DIAG: Other voice and resonance disorders  Rationale for Evaluation and Treatment: Rehabilitation  SUBJECTIVE:   SUBJECTIVE STATEMENT: "My voice sounds raspy and higher pitch" Pt accompanied by: self  PERTINENT HISTORY: "Warren Weeks is a 77 y.o. male who has been experiencing hoarseness for approximately 7 to 8 months, which has progressively worsened. He reports no recent illness, new events, surgeries, or intubations that could have triggered this symptom. There is no history of tobacco use or smoking. He reports unintentional weight loss, which his doctor attributes to his Lasix medication. His pulmonologist suggested that the hoarseness could be a side effect of his inhaler. He has previously consulted a gastroenterologist and undergone a colonoscopy. He reports no difficulty swallowing and maintains a  normal diet. He feels a sensation of swelling in his throat. He is currently taking omeprazole twice daily for acid reflux. He has been coughing frequently, particularly in the mornings, producing thick white phlegm. He contracted COVID-19 about a month ago. He drinks coffee every morning and has been diagnosed with mild asthma.  He experiences tinnitus, or ringing in his ears. This symptom does not disturb his sleep as he uses a CPAP machine. He also does not notice it when watching TV due to the background noise. He has had his hearing tested in the past and does not believe there has been any change in his hearing.  He has a deviated septum and frequently experiences nasal congestion. He often uses a saline solution or gel to alleviate this. He feels as though he is not breathing fully and can feel drainage from his nose. He was previously seen by Dr. Suszanne Conners who recommended nasal irrigation with a saline, which he found beneficial."   PAIN:  Are you having pain? Yes: NPRS scale: 6/10 Pain location: back, knee, neck Pain description: constant pain, aching Aggravating factors: daily activities Relieving factors: tylenol, cortisol shots  FALLS: Has patient  fallen in last 6 months? No  LIVING ENVIRONMENT: Lives with: lives with their spouse Lives in: House/apartment  PLOF:Level of assistance: Independent with ADLs, Independent with IADLs Employment: Retired  PATIENT GOALS: "To get back to my normal voice"  OBJECTIVE:  Note: Objective measures were completed at Evaluation unless otherwise noted.  DIAGNOSTIC FINDINGS: Flexible Laryngoscopy Procedure Note:  After adequate topical anesthetic was applied, 4 mm flexible laryngoscope was passed through the nasal cavity without difficulty. Flexible laryngoscopy shows patent anterior nasal cavity with minimal crusting, no discharge or infection.  Mild to moderate interarytenoid edema and erythema consistent with laryngopharyngeal reflux, otherwise  normal base of tongue and supraglottis Normal vocal cord mobility without vocal cord nodule, mass, polyp or tumor. 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 Hypopharynx normal without mass, pooling of secretions or aspiration.   COGNITION: Overall cognitive status: Within functional limits for tasks assessed  SOCIAL HISTORY: Occupation: retired Counsellor intake: optimal Caffeine/alcohol intake: moderate (decreased coffee intake after ENT visit) Daily voice use: moderate  PERCEPTUAL VOICE ASSESSMENT: Voice quality: hoarse, harsh, and rough Vocal abuse: habitual throat clearing - reports only in the morning Resonance: normal Respiratory function: thoracic breathing, clavicular breathing, and speaking on residual capacity  OBJECTIVE VOICE ASSESSMENT: Maximum phonation time for sustained "ah": 14 seconds Conversational loudness average: 64-73 dB Conversational loudness range: 70 dB S/z ratio: 1.29 (Suggestive of dysfunction >1.0)  PATIENT REPORTED OUTCOME MEASURES (PROM): V-RQOL: 30, indicated poor vocal quality and RSI: 18  TODAY'S TREATMENT:                                                                                                                                          04/28/23: Reviewed POC and initiated education and instruction of reflux precautions. Handout provided. Pt verbalized understanding.   PATIENT EDUCATION: Education details: POC, reflux precautions Person educated: Patient Education method: Explanation and Handouts Education comprehension: verbalized understanding  GOALS: Goals reviewed with patient? Yes  SHORT TERM GOALS: Target date: 05/26/2023  Pt will report daily completion of HEP over 2 sessions Baseline: Goal status: INITIAL  2.  Pt will carryover vocal hygiene and reflux recommendations > 1 week with rare min A  Baseline:  Goal status: INITIAL  3.  Pt will demonstrate vocal exercises with optimal vocal quality and adequate breath  support given occasional min A x2 sessions Baseline:  Goal status: INITIAL  4.  Pt will maintain optimal vocal quality in 5-10 minute unstructured conversations given occasional min A x2 sessions Baseline:  Goal status: INITIAL   LONG TERM GOALS: Target date: 06/23/2023  Pt will maintain optimal vocal quality in 15+ minute unstructured conversation x2 sessions with mod I Baseline:  Goal status: INITIAL  2.  Pt will report daily completion of HEP >2 weeks Baseline:  Goal status: INITIAL  3.  Pt will report subjective improvement of vocal quality via PROM by LTG date Baseline: V-RQOL=30  Goal status: INITIAL  ASSESSMENT:  CLINICAL IMPRESSION: Patient is a 77 y.o. M who was seen today for dysphonia. 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 Pt reports decline in vocal quality that began about a year ago. Has been taking reflux medication for five years, currently taking 20 mg omeprazole x2/day. Reports chronic throat clearing in the mornings with mucus expectoration. No throat clears observed in evaluation. Pt presents with usual mild-mod raspy vocal quality, with increased hoarseness observed as discourse continued. Trialed increased intensity and effort for short utterances, with mild improvements in quality. Pt will benefit from skills ST to improve quality of voice and communication effectiveness.   OBJECTIVE IMPAIRMENTS: include voice disorder. These impairments are limiting patient from effectively communicating at home and in community. Factors affecting potential to achieve goals and functional outcome are co-morbidities.. Patient will benefit from skilled SLP services to address above impairments and improve overall function.  REHAB POTENTIAL: Good  PLAN:  SLP FREQUENCY: 1x/week  SLP DURATION: 8 weeks  PLANNED INTERVENTIONS: 16109- Speech  Eval Behavioral Qualitative Voice Resonance, 60454 Treatment of speech (30 or 45 min) , Cueing hierachy, Functional tasks, SLP instruction and feedback, and Compensatory strategies    Delany Loonam, Student-SLP 04/28/2023, 11:13 AM

## 2023-04-28 ENCOUNTER — Ambulatory Visit: Payer: Medicare PPO | Attending: Internal Medicine

## 2023-04-28 DIAGNOSIS — R498 Other voice and resonance disorders: Secondary | ICD-10-CM | POA: Insufficient documentation

## 2023-04-28 NOTE — Patient Instructions (Addendum)
Practice 10 abdominal breaths  Straw phonation  2 minutes prior to PHoRTE and throughout the day and before any prolonged speaking  PHoRTE - twice a day  [email or web address]   10 Loud AH's as loud as you can and as long as you can  2. 10 Pitch glides up, 10 pitch glides down   3. 10 sentences in loud high pitch voice, like you are calling your neighbor over the phone  4. 10 sentences in loud low authoritative pitch, like you are the boss  Use a good belly breath before each exercise- feel your abs contract in as you use your voice  Use a good abdominal breath to power your voice when talking  Keep reducing throat clears  ACID REFLUX PRECAUTIONS ACID REFLUX can be a possible cause of a voice disorder or throat irritation. Acid reflux is a disorder where acid from your stomach is abnormally spilled over onto your voice box after eating, during sleep, or even during singing. Acid reflux causes irritation and inflammation to your vocal folds and should be avoided and treated by changing eating habits, changing lifestyle, and taking medication (if prescribed by your doctor).   CHANGE EATING HABITS   Avoid "trigger" foods. Certain foods and drinks can trigger acid reflux.  1. Caffeine- in coffee, tea, chocolate, sodas  2. Carbonated beverages  3. Mint and menthol  4. Fatty/fried foods  5. Citrus fruits  6. Tomato products  7. Spicy foods  8. Alcohol    CHANGING LIFESTYLE HABITS   Drink 8 glasses of water per day (64oz)    Stop smoking   Avoid clearing your throat   Allow 3 hours between last big meal and going to bed at night   Keep yourself upright for one hour after you eat   Elevate the head of your bed using 6-inch blocks under the head of the bed or a bed wedge between the box spring and the mattress.   Eat small meals throughout the day rather than 3 big meals   Eat slowly   Wear loose clothing   TAKING MEDICATION   If prescribed one time a day,  take 15-30 minutes before breakfast   If prescribed two times a day, take 15-30 minutes before breakfast and 15- 30 minutes before dinner   CHANGING THE WAY YOU USE YOUR VOICE  "Best voice/ Least effort"

## 2023-04-30 DIAGNOSIS — G4733 Obstructive sleep apnea (adult) (pediatric): Secondary | ICD-10-CM | POA: Diagnosis not present

## 2023-05-10 NOTE — Therapy (Unsigned)
OUTPATIENT SPEECH LANGUAGE PATHOLOGY VOICE TREATMENT   Patient Name: Warren Weeks MRN: [id number] DOB:[birth year], 77 y.o., male Today's Date: 05/11/2023  PCP: Corwin Levins MD REFERRING PROVIDER: Laren Boom, DO  END OF SESSION:  End of Session - 05/11/23 0845     Visit Number 2    Number of Visits 9    Date for SLP Re-Evaluation 06/23/23    Authorization Type [employer] $20 copay    SLP Start Time 0845    SLP Stop Time  0930    SLP Time Calculation (min) 45 min    Activity Tolerance Patient tolerated treatment well              Past Medical History:  Diagnosis Date   ALLERGIC RHINITIS 09/16/2007   ALLERGY 03/24/2007   Allergy    ANXIETY 03/28/2007   Arthritis    knees, back,shoulder   CAROTID ARTERY STENOSIS, BILATERAL 07/01/2009   yearly checks - no current problems   Cataract    bilateral,removed   CEPHALGIA 06/08/2009   CHEST PAIN 09/16/2007   no current problems   COLONIC POLYPS, HX OF 09/16/2007   COPD (chronic obstructive pulmonary disease) (HCC) 05/12/2017   mild per patient - no inhaler   Dizziness and giddiness 06/08/2009   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   DYSPNEA/SHORTNESS OF BREATH 09/16/2007   ERECTILE DYSFUNCTION 03/28/2007   ESOPHAGEAL STRICTURE 09/16/2007   ESOPHAGITIS 03/24/2007   GERD 03/24/2007   GLUCOSE INTOLERANCE 09/16/2007   HEMORRHOIDS, INTERNAL 03/24/2007   HYPERCHOLESTEROLEMIA 03/24/2007   HYPERLIPIDEMIA 09/16/2007   HYPERTENSION 03/28/2007   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/2011   Impaired glucose tolerance 10/04/2010   LOW BACK PAIN 03/28/2007   MAGNETIC RESONANCE IMAGING, BRAIN, ABNORMAL 06/26/2009   NECK PAIN, LEFT 06/20/2010   OBESITY 03/24/2007   OTITIS MEDIA, LEFT 06/10/2009   Palpitations 06/20/2010   no current problems   PVD 06/25/2009   RASH-NONVESICULAR 06/17/2009   Sleep apnea 12/25/2021   URI 08/16/2009   Past Surgical History:  Procedure Laterality Date   COLONOSCOPY  03/2013   polyps/[place]   LEFT HEART  CATH AND CORONARY ANGIOGRAPHY N/A 10/14/2021   Procedure: LEFT HEART CATH AND CORONARY ANGIOGRAPHY;  Surgeon: Corky Crafts, MD;  Location: [facility]ASIVE [facility];  Service: Cardiovascular;  Laterality: N/A;   NASAL SEPTOPLASTY W/ TURBINOPLASTY Bilateral 01/03/2021   Procedure: NASAL SEPTOPLASTY WITH BILATERAL TURBINATE REDUCTION;  Surgeon: Newman Pies, MD;  Location:  SUR[facility]ERY CENTER;  Service: ENT;  Laterality: Bilateral;   RIGHT HEART CATH N/A 02/04/2023   Procedure: RIGHT HEART CATH;  Surgeon: Laurey Morale, MD;  Location: [facility] INVASIVE CV [facility];  Service: Cardiovascular;  Laterality: N/A;   s/p right knee arthroscopy     UPPER GASTROINTESTINAL ENDOSCOPY     gerd   Patient Active Problem List   Diagnosis Date Noted   Vitamin D deficiency 03/13/2023   CKD (chronic kidney disease) 03/11/2023   Pulmonary hypertension (HCC) 01/22/2023   Asthma 12/16/2022   Right flank pain 09/01/2022   Thrush 07/03/2022   Left leg pain 02/08/2022   Voice hoarseness 02/08/2022   Cramp in lower leg 02/08/2022   Left hand pain 02/08/2022   Sleep apnea 12/25/2021   Loud snoring 10/17/2021   Coronary artery calcification    Degenerative joint disease of cervical spine 03/12/2021   Acute sinus infection 12/25/2020   Iliotibial band syndrome affecting left lower leg 09/08/2020   Neck pain on right side 09/05/2020   OAB (overactive bladder) 07/05/2020   Pain  and swelling of right lower leg 07/05/2020   Cervicalgia 07/05/2020   Right knee pain 07/05/2020   PVC's (premature ventricular contractions) 03/02/2020   BPH (benign prostatic hyperplasia) 03/02/2020   Arthritis 03/02/2020   Diastolic dysfunction 09/14/2019   Urinary frequency 09/14/2019   Urinary leakage 12/07/2018   Left knee pain 12/07/2018   Chronic sinusitis 10/05/2018   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   Pleurisy 03/16/2017   Bilateral leg pain 11/22/2016   Insomnia 10/13/2016   Cough 07/22/2016   Wheezing 07/22/2016   Peripheral  edema 02/21/2016   Venous insufficiency 02/05/2016   Low back pain radiating to lower extremity 02/05/2016   Vertigo    Hypersomnolence 10/24/2015   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   Chest pain 01/18/2013   Hematochezia 10/12/2012   Viral illness 05/26/2012   Palpitations 01/05/2012   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/2011   Fatigue 04/09/2011   Rash 12/22/2010   Encounter for long-term (current) use of high-risk medication 10/08/2010   Impaired glucose tolerance 10/04/2010   Encounter for well adult exam with abnormal findings 10/04/2010   Carotid artery stenosis 07/01/2009   PVD 06/25/2009   Allergic rhinitis 09/16/2007   ESOPHAGEAL STRICTURE 09/16/2007   DOE (dyspnea on exertion) 09/16/2007   History of colonic polyps 09/16/2007   Anxiety state 03/28/2007   ERECTILE DYSFUNCTION 03/28/2007   Hyperlipidemia 03/28/2007   Essential hypertension 03/28/2007   LOW BACK PAIN 03/28/2007   OBESITY 03/24/2007   Internal hemorrhoids 03/24/2007   Esophagitis 03/24/2007   GERD 03/24/2007    Onset date: 04/13/2023 (referral date)  REFERRING DIAG: R49.0 (ICD-10-CM) - Dysphonia  THERAPY DIAG: Other voice and resonance disorders  Rationale for Evaluation and Treatment: Rehabilitation  SUBJECTIVE:   SUBJECTIVE STATEMENT: "About the same"  Pt accompanied by: self  PERTINENT HISTORY: "Buryl Schneiders is a 77 y.o. male who has been experiencing hoarseness for approximately 7 to 8 months, which has progressively worsened. He reports no recent illness, new events, surgeries, or intubations that could have triggered this symptom. There is no history of tobacco use or smoking. He reports unintentional weight loss, which his doctor attributes to his Lasix medication. His pulmonologist suggested that the hoarseness could be a side effect of his inhaler. He has previously consulted a gastroenterologist and undergone a colonoscopy. He reports no difficulty swallowing and maintains a normal diet. He feels a  sensation of swelling in his throat. He is currently taking omeprazole twice daily for acid reflux. He has been coughing frequently, particularly in the mornings, producing thick white phlegm. He contracted COVID-19 about a month ago. He drinks coffee every morning and has been diagnosed with mild asthma.  He experiences tinnitus, or ringing in his ears. This symptom does not disturb his sleep as he uses a CPAP machine. He also does not notice it when watching TV due to the background noise. He has had his hearing tested in the past and does not believe there has been any change in his hearing.  He has a deviated septum and frequently experiences nasal congestion. He often uses a saline solution or gel to alleviate this. He feels as though he is not breathing fully and can feel drainage from his nose. He was previously seen by Dr. Suszanne Conners who recommended nasal irrigation with a saline, which he found beneficial."   PAIN:  Are you having pain? Yes: NPRS scale: 6/10 Pain location: back, knee, neck Pain description: constant pain, aching Aggravating factors: daily activities Relieving factors: tylenol, cortisol shots  FALLS: Has patient fallen in  last 6 months? No  LIVING ENVIRONMENT: Lives with: lives with their spouse Lives in: House/apartment  PLOF:Level of assistance: Independent with ADLs, Independent with IADLs Employment: Retired  PATIENT GOALS: "To get back to my normal voice"  OBJECTIVE:  Note: Objective measures were completed at Evaluation unless otherwise noted.  DIAGNOSTIC FINDINGS: Flexible Laryngoscopy Procedure Note:  After adequate topical anesthetic was applied, 4 mm flexible laryngoscope was passed through the nasal cavity without difficulty. Flexible laryngoscopy shows patent anterior nasal cavity with minimal crusting, no discharge or infection.  Mild to moderate interarytenoid edema and erythema consistent with laryngopharyngeal reflux, otherwise normal base of tongue  and supraglottis Normal vocal cord mobility without vocal cord nodule, mass, polyp or tumor. 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 Hypopharynx normal without mass, pooling of secretions or aspiration.   COGNITION: Overall cognitive status: Within functional limits for tasks assessed  SOCIAL HISTORY: Occupation: retired Counsellor intake: optimal Caffeine/alcohol intake: moderate (decreased coffee intake after ENT visit) Daily voice use: moderate  PERCEPTUAL VOICE ASSESSMENT: Voice quality: hoarse, harsh, and rough Vocal abuse: habitual throat clearing - reports only in the morning Resonance: normal Respiratory function: thoracic breathing, clavicular breathing, and speaking on residual capacity  TODAY'S TREATMENT:                                                                                                                                         05/10/23: Denied any further behavioral changes for LPR since eval. SLP educated patient [id number] breathing and semi-occluded vocal tract exercises purpose and execution to reduce laryngeal tension and promote balanced and coordinated use of respiratory and phonatory sub-systems. SLP provided modeling and feedback throughout exercises, with pt able to achieve success with intermittent mod A to ID reduced breath support and increased upper body tension. Straw in water was used to leverage biofeedback to optimize pt's understanding of performance. Updated HEP to include SOVTE with handout provided.   04/28/23: Reviewed POC and initiated education and instruction of reflux precautions. Handout provided. Pt verbalized understanding.   PATIENT EDUCATION: Education details: POC, reflux precautions Person educated: Patient Education method: Explanation and Handouts Education comprehension: verbalized understanding  GOALS: Goals reviewed with patient? Yes  SHORT TERM GOALS: Target date: 05/26/2023  Pt will report daily completion of  HEP over 2 sessions Baseline: Goal status: IN PROGRESS  2.  Pt will carryover vocal hygiene and reflux recommendations > 1 week with rare min A  Baseline:  Goal status: IN PROGRESS  3.  Pt will demonstrate vocal exercises with optimal vocal quality and adequate breath support given occasional min A x2 sessions Baseline:  Goal status: IN PROGRESS  4.  Pt will maintain optimal vocal quality in 5-10 minute unstructured conversations given occasional min A x2 sessions Baseline:  Goal status: IN PROGRESS   LONG TERM GOALS: Target date: 06/23/2023  Pt will maintain optimal vocal quality in 15+ minute unstructured  conversation x2 sessions with mod I Baseline:  Goal status: IN PROGRESS  2.  Pt will report daily completion of HEP >2 weeks Baseline:  Goal status: IN PROGRESS  3.  Pt will report subjective improvement of vocal quality via PROM by LTG date Baseline: V-RQOL=30 Goal status: IN PROGRESS  ASSESSMENT:  CLINICAL IMPRESSION: Patient is a 77 y.o. M who was seen today for dysphonia. 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 Pt reports decline in vocal quality that began about a year ago. Has been taking reflux medication for five years, currently taking 20 mg omeprazole x2/day. Reports chronic throat clearing in the mornings with mucus expectoration. No throat clears observed in evaluation. Pt presents with usual mild-mod raspy vocal quality, with increased hoarseness observed as discourse continued. Introduced vocal exercises today, with intermittent mod A required to achieve optimal performance. Pt will benefit from skills ST to improve quality of voice and communication effectiveness.   OBJECTIVE IMPAIRMENTS: include voice disorder. These impairments are limiting patient from effectively communicating at home and in community. Factors affecting  potential to achieve goals and functional outcome are co-morbidities.. Patient will benefit from skilled SLP services to address above impairments and improve overall function.  REHAB POTENTIAL: Good  PLAN:  SLP FREQUENCY: 1x/week  SLP DURATION: 8 weeks  PLANNED INTERVENTIONS: 82956- Speech Eval Behavioral Qualitative Voice Resonance, 21308 Treatment of speech (30 or 45 min) , Cueing hierachy, Functional tasks, SLP instruction and feedback, and Compensatory strategies    Gracy Racer, CCC-SLP 05/11/2023, 11:04 AM

## 2023-05-11 ENCOUNTER — Ambulatory Visit: Payer: Medicare PPO

## 2023-05-11 DIAGNOSIS — R498 Other voice and resonance disorders: Secondary | ICD-10-CM | POA: Diagnosis not present

## 2023-05-11 NOTE — Patient Instructions (Signed)
  ABDOMINAL BREATHING  Shoulders down - this is a cue to relax Place your hand on your abdomen - this helps you focus on easy abdominal breath support - the best and most relaxed way to breathe Breathe in through your nose and fill your belly with air, watching your hand move outward Breathe out through your mouth and watch your belly move in. An audible "sh"  may help   Think of your belly as a balloon, when you fill with air (inhale), the balloon gets bigger. As the air goes out (exhale), the balloon deflates.  If you are having difficulty coordinating this, lay on your back with a plastic cup on your belly and repeat the above steps, watching you belly move up with inhalation and down with exhalations  Practice breathing in and out in front of a mirror, watching your belly Breathe in for a count of 5 and breathe out for a count of 5   Do 10 repetitions, twice a day    Semi-occluded vocal tract exercises (SOVTE)  These allow your vocal folds to vibrate without excess tension and promotes high placement of the voice  Use SOVTE as a warm up before prolonged speaking and vocal exercises  Watch Vocal Straw Exercises with Lenox Ahr on YouTube: [email or web address]  Make sure your lips are rounded and sealed around straw   Exercises: (2-3x each exercise)  Blow air through straw  Hum through straw  Hum (low to high pitch) through straw Hum (high to low pitch) through straw   Hum "[employer]" through [employer] "happy birthday" through [employer] air through straw into water  Hum through straw into water  Hum (low to high pitch) through straw into water  Hum (high to low pitch) through straw into water   Hum "[employer]" through straw into water   Hum "happy birthday" through straw into water   A goal would be 2-3 minutes several times a day and prior to vocal exercises  As always, use good belly breathing while completing SOVTE. Keep your upper  body (including your face relaxed)

## 2023-05-18 ENCOUNTER — Ambulatory Visit: Payer: Medicare PPO

## 2023-05-18 DIAGNOSIS — R498 Other voice and resonance disorders: Secondary | ICD-10-CM

## 2023-05-18 NOTE — Therapy (Signed)
OUTPATIENT SPEECH LANGUAGE PATHOLOGY VOICE TREATMENT   Patient Name: Warren Weeks MRN: [id number] DOB:05-Aug-1945, 77 y.o., male Today's Date: 05/18/2023  PCP: Corwin Levins MD REFERRING PROVIDER: Laren Boom, DO  END OF SESSION:  End of Session - 05/18/23 0846     Visit Number 3    Number of Visits 9    Date for SLP Re-Evaluation 06/23/23    Authorization Type [employer] $20 copay    SLP Start Time 0846    SLP Stop Time  0930    SLP Time Calculation (min) 44 min    Activity Tolerance Patient tolerated treatment well               Past Medical History:  Diagnosis Date   ALLERGIC RHINITIS 09/16/2007   ALLERGY 03/24/2007   Allergy    ANXIETY 03/28/2007   Arthritis    knees, back,shoulder   CAROTID ARTERY STENOSIS, BILATERAL 07/01/2009   yearly checks - no current problems   Cataract    bilateral,removed   CEPHALGIA 06/08/2009   CHEST PAIN 09/16/2007   no current problems   COLONIC POLYPS, HX OF 09/16/2007   COPD (chronic obstructive pulmonary disease) (HCC) 05/12/2017   mild per patient - no inhaler   Dizziness and giddiness 06/08/2009   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   DYSPNEA/SHORTNESS OF BREATH 09/16/2007   ERECTILE DYSFUNCTION 03/28/2007   ESOPHAGEAL STRICTURE 09/16/2007   ESOPHAGITIS 03/24/2007   GERD 03/24/2007   GLUCOSE INTOLERANCE 09/16/2007   HEMORRHOIDS, INTERNAL 03/24/2007   HYPERCHOLESTEROLEMIA 03/24/2007   HYPERLIPIDEMIA 09/16/2007   HYPERTENSION 03/28/2007   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/2011   Impaired glucose tolerance 10/04/2010   LOW BACK PAIN 03/28/2007   MAGNETIC RESONANCE IMAGING, BRAIN, ABNORMAL 06/26/2009   NECK PAIN, LEFT 06/20/2010   OBESITY 03/24/2007   OTITIS MEDIA, LEFT 06/10/2009   Palpitations 06/20/2010   no current problems   PVD 06/25/2009   RASH-NONVESICULAR 06/17/2009   Sleep apnea 12/25/2021   URI 08/16/2009   Past Surgical History:  Procedure Laterality Date   COLONOSCOPY  03/2013   polyps/[place]   LEFT  HEART CATH AND CORONARY ANGIOGRAPHY N/A 10/14/2021   Procedure: LEFT HEART CATH AND CORONARY ANGIOGRAPHY;  Surgeon: Corky Crafts, MD;  Location: [facility];  Service: Cardiovascular;  Laterality: N/A;   NASAL SEPTOPLASTY W/ TURBINOPLASTY Bilateral 01/03/2021   Procedure: NASAL SEPTOPLASTY WITH BILATERAL TURBINATE REDUCTION;  Surgeon: Newman Pies, MD;  Location: [facility];  Service: ENT;  Laterality: Bilateral;   RIGHT HEART CATH N/A 02/04/2023   Procedure: RIGHT HEART CATH;  Surgeon: Laurey Morale, MD;  Location: [facility];  Service: Cardiovascular;  Laterality: N/A;   s/p right knee arthroscopy     UPPER GASTROINTESTINAL ENDOSCOPY     gerd   Patient Active Problem List   Diagnosis Date Noted   Vitamin D deficiency 03/13/2023   CKD (chronic kidney disease) 03/11/2023   Pulmonary hypertension (HCC) 01/22/2023   Asthma 12/16/2022   Right flank pain 09/01/2022   Thrush 07/03/2022   Left leg pain 02/08/2022   Voice hoarseness 02/08/2022   Cramp in lower leg 02/08/2022   Left hand pain 02/08/2022   Sleep apnea 12/25/2021   Loud snoring 10/17/2021   Coronary artery calcification    Degenerative joint disease of cervical spine 03/12/2021   Acute sinus infection 12/25/2020   Iliotibial band syndrome affecting left lower leg 09/08/2020   Neck pain on right side 09/05/2020   OAB (overactive bladder) 07/05/2020  Pain and swelling of right lower leg 07/05/2020   Cervicalgia 07/05/2020   Right knee pain 07/05/2020   PVC's (premature ventricular contractions) 03/02/2020   BPH (benign prostatic hyperplasia) 03/02/2020   Arthritis 03/02/2020   Diastolic dysfunction 09/14/2019   Urinary frequency 09/14/2019   Urinary leakage 12/07/2018   Left knee pain 12/07/2018   Chronic sinusitis 10/05/2018   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   Pleurisy 03/16/2017   Bilateral leg pain 11/22/2016   Insomnia 10/13/2016   Cough 07/22/2016   Wheezing 07/22/2016    Peripheral edema 02/21/2016   Venous insufficiency 02/05/2016   Low back pain radiating to lower extremity 02/05/2016   Vertigo    Hypersomnolence 10/24/2015   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   Chest pain 01/18/2013   Hematochezia 10/12/2012   Viral illness 05/26/2012   Palpitations 01/05/2012   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/2011   Fatigue 04/09/2011   Rash 12/22/2010   Encounter for long-term (current) use of high-risk medication 10/08/2010   Impaired glucose tolerance 10/04/2010   Encounter for well adult exam with abnormal findings 10/04/2010   Carotid artery stenosis 07/01/2009   PVD 06/25/2009   Allergic rhinitis 09/16/2007   ESOPHAGEAL STRICTURE 09/16/2007   DOE (dyspnea on exertion) 09/16/2007   History of colonic polyps 09/16/2007   Anxiety state 03/28/2007   ERECTILE DYSFUNCTION 03/28/2007   Hyperlipidemia 03/28/2007   Essential hypertension 03/28/2007   LOW BACK PAIN 03/28/2007   OBESITY 03/24/2007   Internal hemorrhoids 03/24/2007   Esophagitis 03/24/2007   GERD 03/24/2007    Onset date: 04/13/2023 (referral date)  REFERRING DIAG: R49.0 (ICD-10-CM) - Dysphonia  THERAPY DIAG: Other voice and resonance disorders  Rationale for Evaluation and Treatment: Rehabilitation  SUBJECTIVE:   SUBJECTIVE STATEMENT: "I feel like I want to cough. Like there is something right here"  Pt accompanied by: self  PERTINENT HISTORY: "Warren Weeks is a 77 y.o. male who has been experiencing hoarseness for approximately 7 to 8 months, which has progressively worsened. He reports no recent illness, new events, surgeries, or intubations that could have triggered this symptom. There is no history of tobacco use or smoking. He reports unintentional weight loss, which his doctor attributes to his Lasix medication. His pulmonologist suggested that the hoarseness could be a side effect of his inhaler. He has previously consulted a gastroenterologist and undergone a colonoscopy. He reports no  difficulty swallowing and maintains a normal diet. He feels a sensation of swelling in his throat. He is currently taking omeprazole twice daily for acid reflux. He has been coughing frequently, particularly in the mornings, producing thick white phlegm. He contracted COVID-19 about a month ago. He drinks coffee every morning and has been diagnosed with mild asthma.  He experiences tinnitus, or ringing in his ears. This symptom does not disturb his sleep as he uses a CPAP machine. He also does not notice it when watching TV due to the background noise. He has had his hearing tested in the past and does not believe there has been any change in his hearing.  He has a deviated septum and frequently experiences nasal congestion. He often uses a saline solution or gel to alleviate this. He feels as though he is not breathing fully and can feel drainage from his nose. He was previously seen by Dr. Suszanne Conners who recommended nasal irrigation with a saline, which he found beneficial."   PAIN:  Are you having pain? Yes: NPRS scale: 6/10 Pain location: back, knee, neck Pain description: constant pain, aching Aggravating factors: daily activities  Relieving factors: tylenol, cortisol shots  FALLS: Has patient fallen in last 6 months? No  LIVING ENVIRONMENT: Lives with: lives with their spouse Lives in: House/apartment  PLOF:Level of assistance: Independent with ADLs, Independent with IADLs Employment: Retired  PATIENT GOALS: "To get back to my normal voice"  OBJECTIVE:  Note: Objective measures were completed at Evaluation unless otherwise noted.  DIAGNOSTIC FINDINGS: Flexible Laryngoscopy Procedure Note:  After adequate topical anesthetic was applied, 4 mm flexible laryngoscope was passed through the nasal cavity without difficulty. Flexible laryngoscopy shows patent anterior nasal cavity with minimal crusting, no discharge or infection.  Mild to moderate interarytenoid edema and erythema consistent  with laryngopharyngeal reflux, otherwise normal base of tongue and supraglottis Normal vocal cord mobility without vocal cord nodule, mass, polyp or tumor. 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 Hypopharynx normal without mass, pooling of secretions or aspiration.   COGNITION: Overall cognitive status: Within functional limits for tasks assessed  SOCIAL HISTORY: Occupation: retired Counsellor intake: optimal Caffeine/alcohol intake: moderate (decreased coffee intake after ENT visit) Daily voice use: moderate  PERCEPTUAL VOICE ASSESSMENT: Voice quality: hoarse, harsh, and rough Vocal abuse: habitual throat clearing - reports only in the morning Resonance: normal Respiratory function: thoracic breathing, clavicular breathing, and speaking on residual capacity  TODAY'S TREATMENT:                                                                                                                                         05/18/23: Re-educated SOVTE exercise program d/t reported inaccurate completion (>30 minutes). With SLP modeling and intermittent cues, pt able to accurately demonstrate within reasonable time. Introduced PhoRTE exercises to address presbylarynx. Pt able to demo with rare min A. Demonstrated improved breath support and clarity with use of trained techniques. Updated HEP with handout provided. Answered pt questions to his satisfaction. No coughing evidenced today.   05/10/23: Denied any further behavioral changes for LPR since eval. SLP educated patient [id number] breathing and semi-occluded vocal tract exercises purpose and execution to reduce laryngeal tension and promote balanced and coordinated use of respiratory and phonatory sub-systems. SLP provided modeling and feedback throughout exercises, with pt able to achieve success with intermittent mod A to ID reduced breath support and increased upper body tension. Straw in water was used to leverage biofeedback to optimize  pt's understanding of performance. Updated HEP to include SOVTE with handout provided.   04/28/23: Reviewed POC and initiated education and instruction of reflux precautions. Handout provided. Pt verbalized understanding.   PATIENT EDUCATION: Education details: POC, reflux precautions Person educated: Patient Education method: Explanation and Handouts Education comprehension: verbalized understanding  GOALS: Goals reviewed with patient? Yes  SHORT TERM GOALS: Target date: 05/26/2023  Pt will report daily completion of HEP over 2 sessions Baseline: 05/18/23 Goal status: IN PROGRESS  2.  Pt will carryover vocal hygiene and reflux recommendations > 1 week with rare min A  Baseline:  Goal status: IN PROGRESS  3.  Pt will demonstrate vocal exercises with optimal vocal quality and adequate breath support given occasional min A x2 sessions Baseline: 05/18/23 Goal status: IN PROGRESS  4.  Pt will maintain optimal vocal quality in 5-10 minute unstructured conversations given occasional min A x2 sessions Baseline:  Goal status: IN PROGRESS   LONG TERM GOALS: Target date: 06/23/2023  Pt will maintain optimal vocal quality in 15+ minute unstructured conversation x2 sessions with mod I Baseline:  Goal status: IN PROGRESS  2.  Pt will report daily completion of HEP >2 weeks Baseline:  Goal status: IN PROGRESS  3.  Pt will report subjective improvement of vocal quality via PROM by LTG date Baseline: V-RQOL=30 Goal status: IN PROGRESS  ASSESSMENT:  CLINICAL IMPRESSION: Patient is a 77 y.o. M who was seen today for dysphonia. 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 Pt reports decline in vocal quality that began about a year ago. Has been taking reflux medication for five years, currently taking 20 mg omeprazole x2/day. Reports chronic throat clearing in  the mornings with mucus expectoration. No throat clears observed in evaluation. Pt presents with usual mild-mod raspy vocal quality, with increased hoarseness observed as discourse continued. Conducted ongoing education and instruction of vocal exercises today, with intermittent mod A required to achieve optimal performance. Pt will benefit from skills ST to improve quality of voice and communication effectiveness.   OBJECTIVE IMPAIRMENTS: include voice disorder. These impairments are limiting patient from effectively communicating at home and in community. Factors affecting potential to achieve goals and functional outcome are co-morbidities.. Patient will benefit from skilled SLP services to address above impairments and improve overall function.  REHAB POTENTIAL: Good  PLAN:  SLP FREQUENCY: 1x/week  SLP DURATION: 8 weeks  PLANNED INTERVENTIONS: 16109- Speech Eval Behavioral Qualitative Voice Resonance, [id number] Treatment of speech (30 or 45 min) , Cueing hierachy, Functional tasks, SLP instruction and feedback, and Compensatory strategies    Gracy Racer, CCC-SLP 05/18/2023, 9:36 AM

## 2023-05-24 NOTE — Therapy (Unsigned)
OUTPATIENT SPEECH LANGUAGE PATHOLOGY VOICE TREATMENT   Patient Name: Warren Weeks MRN: [id number] DOB:[birth year], 77 y.o., male Today's Date: 05/25/2023  PCP: Corwin Levins MD REFERRING PROVIDER: Laren Boom, DO  END OF SESSION:  End of Session - 05/25/23 0932     Visit Number 4    Number of Visits 9    Date for SLP Re-Evaluation 06/23/23    Authorization Type [employer] $20 copay    SLP Start Time 0933    SLP Stop Time  1020    SLP Time Calculation (min) 47 min    Activity Tolerance Patient tolerated treatment well                Past Medical History:  Diagnosis Date   ALLERGIC RHINITIS 09/16/2007   ALLERGY 03/24/2007   Allergy    ANXIETY 03/28/2007   Arthritis    knees, back,shoulder   CAROTID ARTERY STENOSIS, BILATERAL 07/01/2009   yearly checks - no current problems   Cataract    bilateral,removed   CEPHALGIA 06/08/2009   CHEST PAIN 09/16/2007   no current problems   COLONIC POLYPS, HX OF 09/16/2007   COPD (chronic obstructive pulmonary disease) (HCC) 05/12/2017   mild per patient - no inhaler   Dizziness and giddiness 06/08/2009   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   DYSPNEA/SHORTNESS OF BREATH 09/16/2007   ERECTILE DYSFUNCTION 03/28/2007   ESOPHAGEAL STRICTURE 09/16/2007   ESOPHAGITIS 03/24/2007   GERD 03/24/2007   GLUCOSE INTOLERANCE 09/16/2007   HEMORRHOIDS, INTERNAL 03/24/2007   HYPERCHOLESTEROLEMIA 03/24/2007   HYPERLIPIDEMIA 09/16/2007   HYPERTENSION 03/28/2007   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/2011   Impaired glucose tolerance 10/04/2010   LOW BACK PAIN 03/28/2007   MAGNETIC RESONANCE IMAGING, BRAIN, ABNORMAL 06/26/2009   NECK PAIN, LEFT 06/20/2010   OBESITY 03/24/2007   OTITIS MEDIA, LEFT 06/10/2009   Palpitations 06/20/2010   no current problems   PVD 06/25/2009   RASH-NONVESICULAR 06/17/2009   Sleep apnea 12/25/2021   URI 08/16/2009   Past Surgical History:  Procedure Laterality Date   COLONOSCOPY  03/2013   polyps/[place]   LEFT  HEART CATH AND CORONARY ANGIOGRAPHY N/A 10/14/2021   Procedure: LEFT HEART CATH AND CORONARY ANGIOGRAPHY;  Surgeon: Corky Crafts, MD;  Location: [facility];  Service: Cardiovascular;  Laterality: N/A;   NASAL SEPTOPLASTY W/ TURBINOPLASTY Bilateral 01/03/2021   Procedure: NASAL SEPTOPLASTY WITH BILATERAL TURBINATE REDUCTION;  Surgeon: Newman Pies, MD;  Location: [facility];  Service: ENT;  Laterality: Bilateral;   RIGHT HEART CATH N/A 02/04/2023   Procedure: RIGHT HEART CATH;  Surgeon: Laurey Morale, MD;  Location: [facility];  Service: Cardiovascular;  Laterality: N/A;   s/p right knee arthroscopy     UPPER GASTROINTESTINAL ENDOSCOPY     gerd   Patient Active Problem List   Diagnosis Date Noted   Vitamin D deficiency 03/13/2023   CKD (chronic kidney disease) 03/11/2023   Pulmonary hypertension (HCC) 01/22/2023   Asthma 12/16/2022   Right flank pain 09/01/2022   Thrush 07/03/2022   Left leg pain 02/08/2022   Voice hoarseness 02/08/2022   Cramp in lower leg 02/08/2022   Left hand pain 02/08/2022   Sleep apnea 12/25/2021   Loud snoring 10/17/2021   Coronary artery calcification    Degenerative joint disease of cervical spine 03/12/2021   Acute sinus infection 12/25/2020   Iliotibial band syndrome affecting left lower leg 09/08/2020   Neck pain on right side 09/05/2020   OAB (overactive bladder) 07/05/2020  Pain and swelling of right lower leg 07/05/2020   Cervicalgia 07/05/2020   Right knee pain 07/05/2020   PVC's (premature ventricular contractions) 03/02/2020   BPH (benign prostatic hyperplasia) 03/02/2020   Arthritis 03/02/2020   Diastolic dysfunction 09/14/2019   Urinary frequency 09/14/2019   Urinary leakage 12/07/2018   Left knee pain 12/07/2018   Chronic sinusitis 10/05/2018   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   Pleurisy 03/16/2017   Bilateral leg pain 11/22/2016   Insomnia 10/13/2016   Cough 07/22/2016   Wheezing 07/22/2016    Peripheral edema 02/21/2016   Venous insufficiency 02/05/2016   Low back pain radiating to lower extremity 02/05/2016   Vertigo    Hypersomnolence 10/24/2015   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   Chest pain 01/18/2013   Hematochezia 10/12/2012   Viral illness 05/26/2012   Palpitations 01/05/2012   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/2011   Fatigue 04/09/2011   Rash 12/22/2010   Encounter for long-term (current) use of high-risk medication 10/08/2010   Impaired glucose tolerance 10/04/2010   Encounter for well adult exam with abnormal findings 10/04/2010   Carotid artery stenosis 07/01/2009   PVD 06/25/2009   Allergic rhinitis 09/16/2007   ESOPHAGEAL STRICTURE 09/16/2007   DOE (dyspnea on exertion) 09/16/2007   History of colonic polyps 09/16/2007   Anxiety state 03/28/2007   ERECTILE DYSFUNCTION 03/28/2007   Hyperlipidemia 03/28/2007   Essential hypertension 03/28/2007   LOW BACK PAIN 03/28/2007   OBESITY 03/24/2007   Internal hemorrhoids 03/24/2007   Esophagitis 03/24/2007   GERD 03/24/2007    Onset date: 04/13/2023 (referral date)  REFERRING DIAG: R49.0 (ICD-10-CM) - Dysphonia  THERAPY DIAG: Other voice and resonance disorders  Rationale for Evaluation and Treatment: Rehabilitation  SUBJECTIVE:   SUBJECTIVE STATEMENT: "(my voice) it goes and comes"  Pt accompanied by: self  PERTINENT HISTORY: "Warren Weeks is a 77 y.o. male who has been experiencing hoarseness for approximately 7 to 8 months, which has progressively worsened. He reports no recent illness, new events, surgeries, or intubations that could have triggered this symptom. There is no history of tobacco use or smoking. He reports unintentional weight loss, which his doctor attributes to his Lasix medication. His pulmonologist suggested that the hoarseness could be a side effect of his inhaler. He has previously consulted a gastroenterologist and undergone a colonoscopy. He reports no difficulty swallowing and maintains a  normal diet. He feels a sensation of swelling in his throat. He is currently taking omeprazole twice daily for acid reflux. He has been coughing frequently, particularly in the mornings, producing thick white phlegm. He contracted COVID-19 about a month ago. He drinks coffee every morning and has been diagnosed with mild asthma.  He experiences tinnitus, or ringing in his ears. This symptom does not disturb his sleep as he uses a CPAP machine. He also does not notice it when watching TV due to the background noise. He has had his hearing tested in the past and does not believe there has been any change in his hearing.  He has a deviated septum and frequently experiences nasal congestion. He often uses a saline solution or gel to alleviate this. He feels as though he is not breathing fully and can feel drainage from his nose. He was previously seen by Dr. Suszanne Conners who recommended nasal irrigation with a saline, which he found beneficial."   PAIN:  Are you having pain? Yes: NPRS scale: 6/10 Pain location: back, knee, neck Pain description: constant pain, aching Aggravating factors: daily activities Relieving factors: tylenol, cortisol shots  FALLS:  Has patient fallen in last 6 months? No  LIVING ENVIRONMENT: Lives with: lives with their spouse Lives in: House/apartment  PLOF:Level of assistance: Independent with ADLs, Independent with IADLs Employment: Retired  PATIENT GOALS: "To get back to my normal voice"  OBJECTIVE:  Note: Objective measures were completed at Evaluation unless otherwise noted.  DIAGNOSTIC FINDINGS: Flexible Laryngoscopy Procedure Note:  After adequate topical anesthetic was applied, 4 mm flexible laryngoscope was passed through the nasal cavity without difficulty. Flexible laryngoscopy shows patent anterior nasal cavity with minimal crusting, no discharge or infection.  Mild to moderate interarytenoid edema and erythema consistent with laryngopharyngeal reflux, otherwise  normal base of tongue and supraglottis Normal vocal cord mobility without vocal cord nodule, mass, polyp or tumor. 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 Hypopharynx normal without mass, pooling of secretions or aspiration.   COGNITION: Overall cognitive status: Within functional limits for tasks assessed  SOCIAL HISTORY: Occupation: retired Counsellor intake: optimal Caffeine/alcohol intake: moderate (decreased coffee intake after ENT visit) Daily voice use: moderate  PERCEPTUAL VOICE ASSESSMENT: Voice quality: hoarse, harsh, and rough Vocal abuse: habitual throat clearing - reports only in the morning Resonance: normal Respiratory function: thoracic breathing, clavicular breathing, and speaking on residual capacity  TODAY'S TREATMENT:                                                                                                                                         05/25/23: Presented with intermittent improved vocal clarity in opening conversation. Some baseline hyponasality exhibited. Conducted warm up of SOVT exercises and modified versions of resonant exercises and PhoRTE. Pt able to achieve clear vocal quality with usual fading to intermittent mod A for easy onset, intentional breath support, and forward projection during structured exercises. Demonstrating increasing awareness of forward versus back focused phonation during activities.   05/18/23: Re-educated SOVTE exercise program d/t reported inaccurate completion (>30 minutes). With SLP modeling and intermittent cues, pt able to accurately demonstrate within reasonable time. Introduced PhoRTE exercises to address presbylarynx. Pt able to demo with rare min A. Demonstrated improved breath support and clarity with use of trained techniques. Updated HEP with handout provided. Answered pt questions to his satisfaction. No coughing evidenced today.   05/10/23: Denied any further behavioral changes for LPR since eval. SLP  educated patient [id number] breathing and semi-occluded vocal tract exercises purpose and execution to reduce laryngeal tension and promote balanced and coordinated use of respiratory and phonatory sub-systems. SLP provided modeling and feedback throughout exercises, with pt able to achieve success with intermittent mod A to ID reduced breath support and increased upper body tension. Straw in water was used to leverage biofeedback to optimize pt's understanding of performance. Updated HEP to include SOVTE with handout provided.   04/28/23: Reviewed POC and initiated education and instruction of reflux precautions. Handout provided. Pt verbalized understanding.   PATIENT EDUCATION: Education details: POC, reflux precautions Person educated:  Patient Education method: Explanation and Handouts Education comprehension: verbalized understanding  GOALS: Goals reviewed with patient? Yes  SHORT TERM GOALS: Target date: 05/26/2023  Pt will report daily completion of HEP over 2 sessions Baseline: 05/18/23 Goal status: IN PROGRESS  2.  Pt will carryover vocal hygiene and reflux recommendations > 1 week with rare min A  Baseline:  Goal status: IN PROGRESS  3.  Pt will demonstrate vocal exercises with optimal vocal quality and adequate breath support given occasional min A x2 sessions Baseline: 05/18/23 Goal status: IN PROGRESS  4.  Pt will maintain optimal vocal quality in 5-10 minute unstructured conversations given occasional min A x2 sessions Baseline:  Goal status: IN PROGRESS   LONG TERM GOALS: Target date: 06/23/2023  Pt will maintain optimal vocal quality in 15+ minute unstructured conversation x2 sessions with mod I Baseline:  Goal status: IN PROGRESS  2.  Pt will report daily completion of HEP >2 weeks Baseline:  Goal status: IN PROGRESS  3.  Pt will report subjective improvement of vocal quality via PROM by LTG date Baseline: V-RQOL=30 Goal status: IN  PROGRESS  ASSESSMENT:  CLINICAL IMPRESSION: Patient is a 77 y.o. M who was seen today for dysphonia. 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 Pt presents with usual mild-mod raspy vocal quality, with increased hoarseness observed as discourse continued. Conducted ongoing education and instruction of vocal exercises today, with intermittent mod A required to achieve optimal performance. Pt will benefit from skills ST to improve quality of voice and communication effectiveness.   OBJECTIVE IMPAIRMENTS: include voice disorder. These impairments are limiting patient from effectively communicating at home and in community. Factors affecting potential to achieve goals and functional outcome are co-morbidities.. Patient will benefit from skilled SLP services to address above impairments and improve overall function.  REHAB POTENTIAL: Good  PLAN:  SLP FREQUENCY: 1x/week  SLP DURATION: 8 weeks  PLANNED INTERVENTIONS: 16109- Speech Eval Behavioral Qualitative Voice Resonance, 60454 Treatment of speech (30 or 45 min) , Cueing hierachy, Functional tasks, SLP instruction and feedback, and Compensatory strategies    Gracy Racer, CCC-SLP 05/25/2023, 10:45 AM

## 2023-05-25 ENCOUNTER — Ambulatory Visit: Payer: Medicare PPO | Attending: Internal Medicine

## 2023-05-25 DIAGNOSIS — R498 Other voice and resonance disorders: Secondary | ICD-10-CM | POA: Insufficient documentation

## 2023-05-25 NOTE — Patient Instructions (Signed)
Reflux Raft or Reflux Gourmet

## 2023-05-31 NOTE — Therapy (Unsigned)
OUTPATIENT SPEECH LANGUAGE PATHOLOGY VOICE TREATMENT   Patient Name: RYANANTHONY DURBANO MRN: [id number] DOB:1946-01-28, 77 y.o., male Today's Date: 05/31/2023  PCP: Corwin Levins MD REFERRING PROVIDER: Laren Boom, DO  END OF SESSION:       Past Medical History:  Diagnosis Date   ALLERGIC RHINITIS 09/16/2007   ALLERGY 03/24/2007   Allergy    ANXIETY 03/28/2007   Arthritis    knees, back,shoulder   CAROTID ARTERY STENOSIS, BILATERAL 07/01/2009   yearly checks - no current problems   Cataract    bilateral,removed   CEPHALGIA 06/08/2009   CHEST PAIN 09/16/2007   no current problems   COLONIC POLYPS, HX OF 09/16/2007   COPD (chronic obstructive pulmonary disease) (HCC) 05/12/2017   mild per patient - no inhaler   Dizziness and giddiness 06/08/2009   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   DYSPNEA/SHORTNESS OF BREATH 09/16/2007   ERECTILE DYSFUNCTION 03/28/2007   ESOPHAGEAL STRICTURE 09/16/2007   ESOPHAGITIS 03/24/2007   GERD 03/24/2007   GLUCOSE INTOLERANCE 09/16/2007   HEMORRHOIDS, INTERNAL 03/24/2007   HYPERCHOLESTEROLEMIA 03/24/2007   HYPERLIPIDEMIA 09/16/2007   HYPERTENSION 03/28/2007   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/2011   Impaired glucose tolerance 10/04/2010   LOW BACK PAIN 03/28/2007   MAGNETIC RESONANCE IMAGING, BRAIN, ABNORMAL 06/26/2009   NECK PAIN, LEFT 06/20/2010   OBESITY 03/24/2007   OTITIS MEDIA, LEFT 06/10/2009   Palpitations 06/20/2010   no current problems   PVD 06/25/2009   RASH-NONVESICULAR 06/17/2009   Sleep apnea 12/25/2021   URI 08/16/2009   Past Surgical History:  Procedure Laterality Date   COLONOSCOPY  03/2013   polyps/[place]   LEFT HEART CATH AND CORONARY ANGIOGRAPHY N/A 10/14/2021   Procedure: LEFT HEART CATH AND CORONARY ANGIOGRAPHY;  Surgeon: Corky Crafts, MD;  Location: [facility];  Service: Cardiovascular;  Laterality: N/A;   NASAL SEPTOPLASTY W/ TURBINOPLASTY Bilateral 01/03/2021   Procedure: NASAL SEPTOPLASTY WITH  BILATERAL TURBINATE REDUCTION;  Surgeon: Newman Pies, MD;  Location: [facility];  Service: ENT;  Laterality: Bilateral;   RIGHT HEART CATH N/A 02/04/2023   Procedure: RIGHT HEART CATH;  Surgeon: Laurey Morale, MD;  Location: [facility] CV [facility];  Service: Cardiovascular;  Laterality: N/A;   s/p right knee arthroscopy     UPPER GASTROINTESTINAL ENDOSCOPY     gerd   Patient Active Problem List   Diagnosis Date Noted   Vitamin D deficiency 03/13/2023   CKD (chronic kidney disease) 03/11/2023   Pulmonary hypertension (HCC) 01/22/2023   Asthma 12/16/2022   Right flank pain 09/01/2022   Thrush 07/03/2022   Left leg pain 02/08/2022   Voice hoarseness 02/08/2022   Cramp in lower leg 02/08/2022   Left hand pain 02/08/2022   Sleep apnea 12/25/2021   Loud snoring 10/17/2021   Coronary artery calcification    Degenerative joint disease of cervical spine 03/12/2021   Acute sinus infection 12/25/2020   Iliotibial band syndrome affecting left lower leg 09/08/2020   Neck pain on right side 09/05/2020   OAB (overactive bladder) 07/05/2020   Pain and swelling of right lower leg 07/05/2020   Cervicalgia 07/05/2020   Right knee pain 07/05/2020   PVC's (premature ventricular contractions) 03/02/2020   BPH (benign prostatic hyperplasia) 03/02/2020   Arthritis 03/02/2020   Diastolic dysfunction 09/14/2019   Urinary frequency 09/14/2019   Urinary leakage 12/07/2018   Left knee pain 12/07/2018   Chronic sinusitis 10/05/2018   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   Pleurisy 03/16/2017   Bilateral leg pain 11/22/2016  Insomnia 10/13/2016   Cough 07/22/2016   Wheezing 07/22/2016   Peripheral edema 02/21/2016   Venous insufficiency 02/05/2016   Low back pain radiating to lower extremity 02/05/2016   Vertigo    Hypersomnolence 10/24/2015   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   Chest pain 01/18/2013   Hematochezia 10/12/2012   Viral illness 05/26/2012   Palpitations 01/05/2012    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/2011   Fatigue 04/09/2011   Rash 12/22/2010   Encounter for long-term (current) use of high-risk medication 10/08/2010   Impaired glucose tolerance 10/04/2010   Encounter for well adult exam with abnormal findings 10/04/2010   Carotid artery stenosis 07/01/2009   PVD 06/25/2009   Allergic rhinitis 09/16/2007   ESOPHAGEAL STRICTURE 09/16/2007   DOE (dyspnea on exertion) 09/16/2007   History of colonic polyps 09/16/2007   Anxiety state 03/28/2007   ERECTILE DYSFUNCTION 03/28/2007   Hyperlipidemia 03/28/2007   Essential hypertension 03/28/2007   LOW BACK PAIN 03/28/2007   OBESITY 03/24/2007   Internal hemorrhoids 03/24/2007   Esophagitis 03/24/2007   GERD 03/24/2007    Onset date: 04/13/2023 (referral date)  REFERRING DIAG: R49.0 (ICD-10-CM) - Dysphonia  THERAPY DIAG: No diagnosis found.  Rationale for Evaluation and Treatment: Rehabilitation  SUBJECTIVE:   SUBJECTIVE STATEMENT: "(my voice) it goes and comes"  Pt accompanied by: self  PERTINENT HISTORY: "Than Forsey is a 77 y.o. male who has been experiencing hoarseness for approximately 7 to 8 months, which has progressively worsened. He reports no recent illness, new events, surgeries, or intubations that could have triggered this symptom. There is no history of tobacco use or smoking. He reports unintentional weight loss, which his doctor attributes to his Lasix medication. His pulmonologist suggested that the hoarseness could be a side effect of his inhaler. He has previously consulted a gastroenterologist and undergone a colonoscopy. He reports no difficulty swallowing and maintains a normal diet. He feels a sensation of swelling in his throat. He is currently taking omeprazole twice daily for acid reflux. He has been coughing frequently, particularly in the mornings, producing thick white phlegm. He contracted COVID-19 about a month ago. He drinks coffee every morning and has been diagnosed with mild  asthma.  He experiences tinnitus, or ringing in his ears. This symptom does not disturb his sleep as he uses a CPAP machine. He also does not notice it when watching TV due to the background noise. He has had his hearing tested in the past and does not believe there has been any change in his hearing.  He has a deviated septum and frequently experiences nasal congestion. He often uses a saline solution or gel to alleviate this. He feels as though he is not breathing fully and can feel drainage from his nose. He was previously seen by Dr. Suszanne Conners who recommended nasal irrigation with a saline, which he found beneficial."   PAIN:  Are you having pain? Yes: NPRS scale: 6/10 Pain location: back, knee, neck Pain description: constant pain, aching Aggravating factors: daily activities Relieving factors: tylenol, cortisol shots  FALLS: Has patient fallen in last 6 months? No  LIVING ENVIRONMENT: Lives with: lives with their spouse Lives in: House/apartment  PLOF:Level of assistance: Independent with ADLs, Independent with IADLs Employment: Retired  PATIENT GOALS: "To get back to my normal voice"  OBJECTIVE:  Note: Objective measures were completed at Evaluation unless otherwise noted.  DIAGNOSTIC FINDINGS: Flexible Laryngoscopy Procedure Note:  After adequate topical anesthetic was applied, 4 mm flexible laryngoscope was passed through the nasal cavity without difficulty.  Flexible laryngoscopy shows patent anterior nasal cavity with minimal crusting, no discharge or infection.  Mild to moderate interarytenoid edema and erythema consistent with laryngopharyngeal reflux, otherwise normal base of tongue and supraglottis Normal vocal cord mobility without vocal cord nodule, mass, polyp or tumor. 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 Hypopharynx normal without mass, pooling of secretions or aspiration.   COGNITION: Overall cognitive status: Within functional limits for tasks  assessed  SOCIAL HISTORY: Occupation: retired Counsellor intake: optimal Caffeine/alcohol intake: moderate (decreased coffee intake after ENT visit) Daily voice use: moderate  PERCEPTUAL VOICE ASSESSMENT: Voice quality: hoarse, harsh, and rough Vocal abuse: habitual throat clearing - reports only in the morning Resonance: normal Respiratory function: thoracic breathing, clavicular breathing, and speaking on residual capacity  TODAY'S TREATMENT:                                                                                                                                         06/01/23:  05/25/23: Presented with intermittent improved vocal clarity in opening conversation. Some baseline hyponasality exhibited. Conducted warm up of SOVT exercises and modified versions of resonant exercises and PhoRTE. Pt able to achieve clear vocal quality with usual fading to intermittent mod A for easy onset, intentional breath support, and forward projection during structured exercises. Demonstrating increasing awareness of forward versus back focused phonation during activities.   05/18/23: Re-educated SOVTE exercise program d/t reported inaccurate completion (>30 minutes). With SLP modeling and intermittent cues, pt able to accurately demonstrate within reasonable time. Introduced PhoRTE exercises to address presbylarynx. Pt able to demo with rare min A. Demonstrated improved breath support and clarity with use of trained techniques. Updated HEP with handout provided. Answered pt questions to his satisfaction. No coughing evidenced today.   05/10/23: Denied any further behavioral changes for LPR since eval. SLP educated patient [id number] breathing and semi-occluded vocal tract exercises purpose and execution to reduce laryngeal tension and promote balanced and coordinated use of respiratory and phonatory sub-systems. SLP provided modeling and feedback throughout exercises, with pt able to achieve success with  intermittent mod A to ID reduced breath support and increased upper body tension. Straw in water was used to leverage biofeedback to optimize pt's understanding of performance. Updated HEP to include SOVTE with handout provided.   04/28/23: Reviewed POC and initiated education and instruction of reflux precautions. Handout provided. Pt verbalized understanding.   PATIENT EDUCATION: Education details: POC, reflux precautions Person educated: Patient Education method: Explanation and Handouts Education comprehension: verbalized understanding  GOALS: Goals reviewed with patient? Yes  SHORT TERM GOALS: Target date: 05/26/2023  Pt will report daily completion of HEP over 2 sessions Baseline: 05/18/23 Goal status: IN PROGRESS  2.  Pt will carryover vocal hygiene and reflux recommendations > 1 week with rare min A  Baseline:  Goal status: IN PROGRESS  3.  Pt will demonstrate vocal exercises with optimal vocal quality and  adequate breath support given occasional min A x2 sessions Baseline: 05/18/23 Goal status: IN PROGRESS  4.  Pt will maintain optimal vocal quality in 5-10 minute unstructured conversations given occasional min A x2 sessions Baseline:  Goal status: IN PROGRESS   LONG TERM GOALS: Target date: 06/23/2023  Pt will maintain optimal vocal quality in 15+ minute unstructured conversation x2 sessions with mod I Baseline:  Goal status: IN PROGRESS  2.  Pt will report daily completion of HEP >2 weeks Baseline:  Goal status: IN PROGRESS  3.  Pt will report subjective improvement of vocal quality via PROM by LTG date Baseline: V-RQOL=30 Goal status: IN PROGRESS  ASSESSMENT:  CLINICAL IMPRESSION: Patient is a 77 y.o. M who was seen today for dysphonia. 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 Pt presents with usual mild-mod raspy vocal  quality, with increased hoarseness observed as discourse continued. Conducted ongoing education and instruction of vocal exercises today, with intermittent mod A required to achieve optimal performance. Pt will benefit from skills ST to improve quality of voice and communication effectiveness.   OBJECTIVE IMPAIRMENTS: include voice disorder. These impairments are limiting patient from effectively communicating at home and in community. Factors affecting potential to achieve goals and functional outcome are co-morbidities.. Patient will benefit from skilled SLP services to address above impairments and improve overall function.  REHAB POTENTIAL: Good  PLAN:  SLP FREQUENCY: 1x/week  SLP DURATION: 8 weeks  PLANNED INTERVENTIONS: 16109- Speech Eval Behavioral Qualitative Voice Resonance, [id number] Treatment of speech (30 or 45 min) , Cueing hierachy, Functional tasks, SLP instruction and feedback, and Compensatory strategies    Gracy Racer, CCC-SLP 05/31/2023, 2:13 PM

## 2023-06-01 ENCOUNTER — Ambulatory Visit: Payer: Medicare PPO

## 2023-06-01 DIAGNOSIS — R498 Other voice and resonance disorders: Secondary | ICD-10-CM

## 2023-06-01 NOTE — Patient Instructions (Addendum)
Your "reset" word is mmm-hmm   Strong, easy, clear is the name of the game   Daily Voice Exercises (no water, no straw) Hum "ooo" x2-3 regular pitch Low to high "ooo" x2-3 High to low "ooo" x2-3 Accents "woo woo" x2-3 Hum (buzzy, forward feel on lips) Hum + vowel  Hum + m word  Hum + m words in setences Hum + reading sentences Hum + response

## 2023-06-07 NOTE — Therapy (Signed)
OUTPATIENT SPEECH LANGUAGE PATHOLOGY VOICE TREATMENT (DISCHARGE)   Patient Name: Warren Weeks MRN: [id number] DOB:01/17/1946, 77 y.o., male Today's Date: 06/08/2023  PCP: Warren Levins MD REFERRING PROVIDER: Laren Boom, DO  END OF SESSION:  End of Session - 06/08/23 0934     Visit Number 6    Number of Visits 9    Date for SLP Re-Evaluation 06/23/23    Authorization Type [employer] $20 copay    SLP Start Time 0934    SLP Stop Time  1015    SLP Time Calculation (min) 41 min    Activity Tolerance Patient tolerated treatment well             SPEECH THERAPY DISCHARGE SUMMARY  Visits from Start of Care: 6  Current functional level related to goals / functional outcomes: Demonstrates improved vocal quality and intensity in conversation this session. Pt is pleased with current level of functioning and requested ST discharge today.    Remaining deficits: VF atrophy, edema   Education / Equipment: Vocal hygiene, SOVTE, resonant voice exercises, HEP   Patient agrees to discharge. Patient goals were partially met. Patient is being discharged due to being pleased with the current functional level.   Past Medical History:  Diagnosis Date   ALLERGIC RHINITIS 09/16/2007   ALLERGY 03/24/2007   Allergy    ANXIETY 03/28/2007   Arthritis    knees, back,shoulder   CAROTID ARTERY STENOSIS, BILATERAL 07/01/2009   yearly checks - no current problems   Cataract    bilateral,removed   CEPHALGIA 06/08/2009   CHEST PAIN 09/16/2007   no current problems   COLONIC POLYPS, HX OF 09/16/2007   COPD (chronic obstructive pulmonary disease) (HCC) 05/12/2017   mild per patient - no inhaler   Dizziness and giddiness 06/08/2009   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   DYSPNEA/SHORTNESS OF BREATH 09/16/2007   ERECTILE DYSFUNCTION 03/28/2007   ESOPHAGEAL STRICTURE 09/16/2007   ESOPHAGITIS 03/24/2007   GERD 03/24/2007   GLUCOSE INTOLERANCE 09/16/2007   HEMORRHOIDS, INTERNAL 03/24/2007    HYPERCHOLESTEROLEMIA 03/24/2007   HYPERLIPIDEMIA 09/16/2007   HYPERTENSION 03/28/2007   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/2011   Impaired glucose tolerance 10/04/2010   LOW BACK PAIN 03/28/2007   MAGNETIC RESONANCE IMAGING, BRAIN, ABNORMAL 06/26/2009   NECK PAIN, LEFT 06/20/2010   OBESITY 03/24/2007   OTITIS MEDIA, LEFT 06/10/2009   Palpitations 06/20/2010   no current problems   PVD 06/25/2009   RASH-NONVESICULAR 06/17/2009   Sleep apnea 12/25/2021   URI 08/16/2009   Past Surgical History:  Procedure Laterality Date   COLONOSCOPY  03/2013   polyps/[place]   LEFT HEART CATH AND CORONARY ANGIOGRAPHY N/A 10/14/2021   Procedure: LEFT HEART CATH AND CORONARY ANGIOGRAPHY;  Surgeon: Corky Crafts, MD;  Location: [facility];  Service: Cardiovascular;  Laterality: N/A;   NASAL SEPTOPLASTY W/ TURBINOPLASTY Bilateral 01/03/2021   Procedure: NASAL SEPTOPLASTY WITH BILATERAL TURBINATE REDUCTION;  Surgeon: Newman Pies, MD;  Location: [facility];  Service: ENT;  Laterality: Bilateral;   RIGHT HEART CATH N/A 02/04/2023   Procedure: RIGHT HEART CATH;  Surgeon: Laurey Morale, MD;  Location: [facility];  Service: Cardiovascular;  Laterality: N/A;   s/p right knee arthroscopy     UPPER GASTROINTESTINAL ENDOSCOPY     gerd   Patient Active Problem List   Diagnosis Date Noted   Vitamin D deficiency 03/13/2023   CKD (chronic kidney disease) 03/11/2023   Pulmonary hypertension (HCC) 01/22/2023   Asthma 12/16/2022  Right flank pain 09/01/2022   Thrush 07/03/2022   Left leg pain 02/08/2022   Voice hoarseness 02/08/2022   Cramp in lower leg 02/08/2022   Left hand pain 02/08/2022   Sleep apnea 12/25/2021   Loud snoring 10/17/2021   Coronary artery calcification    Degenerative joint disease of cervical spine 03/12/2021   Acute sinus infection 12/25/2020   Iliotibial band syndrome affecting left lower leg 09/08/2020   Neck pain on right side 09/05/2020   OAB (overactive  bladder) 07/05/2020   Pain and swelling of right lower leg 07/05/2020   Cervicalgia 07/05/2020   Right knee pain 07/05/2020   PVC's (premature ventricular contractions) 03/02/2020   BPH (benign prostatic hyperplasia) 03/02/2020   Arthritis 03/02/2020   Diastolic dysfunction 09/14/2019   Urinary frequency 09/14/2019   Urinary leakage 12/07/2018   Left knee pain 12/07/2018   Chronic sinusitis 10/05/2018   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   Pleurisy 03/16/2017   Bilateral leg pain 11/22/2016   Insomnia 10/13/2016   Cough 07/22/2016   Wheezing 07/22/2016   Peripheral edema 02/21/2016   Venous insufficiency 02/05/2016   Low back pain radiating to lower extremity 02/05/2016   Vertigo    Hypersomnolence 10/24/2015   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   Chest pain 01/18/2013   Hematochezia 10/12/2012   Viral illness 05/26/2012   Palpitations 01/05/2012   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/2011   Fatigue 04/09/2011   Rash 12/22/2010   Encounter for long-term (current) use of high-risk medication 10/08/2010   Impaired glucose tolerance 10/04/2010   Encounter for well adult exam with abnormal findings 10/04/2010   Carotid artery stenosis 07/01/2009   PVD 06/25/2009   Allergic rhinitis 09/16/2007   ESOPHAGEAL STRICTURE 09/16/2007   DOE (dyspnea on exertion) 09/16/2007   History of colonic polyps 09/16/2007   Anxiety state 03/28/2007   ERECTILE DYSFUNCTION 03/28/2007   Hyperlipidemia 03/28/2007   Essential hypertension 03/28/2007   LOW BACK PAIN 03/28/2007   OBESITY 03/24/2007   Internal hemorrhoids 03/24/2007   Esophagitis 03/24/2007   GERD 03/24/2007    Onset date: 04/13/2023 (referral date)  REFERRING DIAG: R49.0 (ICD-10-CM) - Dysphonia  THERAPY DIAG: Other voice and resonance disorders  Rationale for Evaluation and Treatment: Rehabilitation  SUBJECTIVE:   SUBJECTIVE STATEMENT: "better" re: speech clarity  Pt accompanied by: self  PERTINENT HISTORY: "Warren Weeks is a 77 y.o.  male who has been experiencing hoarseness for approximately 7 to 8 months, which has progressively worsened. He reports no recent illness, new events, surgeries, or intubations that could have triggered this symptom. There is no history of tobacco use or smoking. He reports unintentional weight loss, which his doctor attributes to his Lasix medication. His pulmonologist suggested that the hoarseness could be a side effect of his inhaler. He has previously consulted a gastroenterologist and undergone a colonoscopy. He reports no difficulty swallowing and maintains a normal diet. He feels a sensation of swelling in his throat. He is currently taking omeprazole twice daily for acid reflux. He has been coughing frequently, particularly in the mornings, producing thick white phlegm. He contracted COVID-19 about a month ago. He drinks coffee every morning and has been diagnosed with mild asthma.  He experiences tinnitus, or ringing in his ears. This symptom does not disturb his sleep as he uses a CPAP machine. He also does not notice it when watching TV due to the background noise. He has had his hearing tested in the past and does not believe there has been any change in his hearing.  He  has a deviated septum and frequently experiences nasal congestion. He often uses a saline solution or gel to alleviate this. He feels as though he is not breathing fully and can feel drainage from his nose. He was previously seen by Dr. Suszanne Conners who recommended nasal irrigation with a saline, which he found beneficial."   PAIN:  Are you having pain? Yes: NPRS scale: 6/10 Pain location: back, knee, neck Pain description: constant pain, aching Aggravating factors: daily activities Relieving factors: tylenol, cortisol shots  FALLS: Has patient fallen in last 6 months? No  LIVING ENVIRONMENT: Lives with: lives with their spouse Lives in: House/apartment  PLOF:Level of assistance: Independent with ADLs, Independent with  IADLs Employment: Retired  PATIENT GOALS: "To get back to my normal voice"  OBJECTIVE:  Note: Objective measures were completed at Evaluation unless otherwise noted.  DIAGNOSTIC FINDINGS: Flexible Laryngoscopy Procedure Note:  After adequate topical anesthetic was applied, 4 mm flexible laryngoscope was passed through the nasal cavity without difficulty. Flexible laryngoscopy shows patent anterior nasal cavity with minimal crusting, no discharge or infection.  Mild to moderate interarytenoid edema and erythema consistent with laryngopharyngeal reflux, otherwise normal base of tongue and supraglottis Normal vocal cord mobility without vocal cord nodule, mass, polyp or tumor. 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 Hypopharynx normal without mass, pooling of secretions or aspiration.   COGNITION: Overall cognitive status: Within functional limits for tasks assessed  SOCIAL HISTORY: Occupation: retired Counsellor intake: optimal Caffeine/alcohol intake: moderate (decreased coffee intake after ENT visit) Daily voice use: moderate  PERCEPTUAL VOICE ASSESSMENT: Voice quality: hoarse, harsh, and rough Vocal abuse: habitual throat clearing - reports only in the morning Resonance: normal Respiratory function: thoracic breathing, clavicular breathing, and speaking on residual capacity  TODAY'S TREATMENT:                                                                                                                                         06/08/23: Completing HEP as recommended. Endorsed overall improvements in speech clarity, including some increased ability to sing. Presents with increased clarity and loudness today. Targeted discourse level communication, in which pt able to maintain clear quality ~90% of conversation. Pt able to reset voice with hum with mod I to improve forward resonance and projection. Pt reports feeling satisfied with current voice and requested ST discharge this  date. Re-administered V-RQOL PROM with score of 11, with notable 19 point improvement.  06/01/23: Conducted vocal exercises today, including resonant exercises up to response level. Established reset word as "mmhmm." Pt required occasional to usual min A to optimize forward resonance as task complexity increased. Pt able to achieve vocal clarity for approximately 85% of trials. Reviewed HEP, in which pt demonstrated difficulty with SOVT exercises into water. Modified to flow exercises without resistance. Updated HEP.   05/25/23: Presented with intermittent improved vocal clarity in opening conversation. Some baseline hyponasality exhibited. Conducted warm up  of SOVT exercises and modified versions of resonant exercises and PhoRTE. Pt able to achieve clear vocal quality with usual fading to intermittent mod A for easy onset, intentional breath support, and forward projection during structured exercises. Demonstrating increasing awareness of forward versus back focused phonation during activities.   05/18/23: Re-educated SOVTE exercise program d/t reported inaccurate completion (>30 minutes). With SLP modeling and intermittent cues, pt able to accurately demonstrate within reasonable time. Introduced PhoRTE exercises to address presbylarynx. Pt able to demo with rare min A. Demonstrated improved breath support and clarity with use of trained techniques. Updated HEP with handout provided. Answered pt questions to his satisfaction. No coughing evidenced today.   05/10/23: Denied any further behavioral changes for LPR since eval. SLP educated patient [id number] breathing and semi-occluded vocal tract exercises purpose and execution to reduce laryngeal tension and promote balanced and coordinated use of respiratory and phonatory sub-systems. SLP provided modeling and feedback throughout exercises, with pt able to achieve success with intermittent mod A to ID reduced breath support and increased upper body  tension. Straw in water was used to leverage biofeedback to optimize pt's understanding of performance. Updated HEP to include SOVTE with handout provided.   04/28/23: Reviewed POC and initiated education and instruction of reflux precautions. Handout provided. Pt verbalized understanding.   PATIENT EDUCATION: Education details: POC, reflux precautions Person educated: Patient Education method: Explanation and Handouts Education comprehension: verbalized understanding  GOALS: Goals reviewed with patient? Yes  SHORT TERM GOALS: Target date: 05/26/2023  Pt will report daily completion of HEP over 2 sessions Baseline: 05/18/23 Goal status: MET  2.  Pt will carryover vocal hygiene and reflux recommendations > 1 week with rare min A  Baseline:  Goal status: MET  3.  Pt will demonstrate vocal exercises with optimal vocal quality and adequate breath support given occasional min A x2 sessions Baseline: 05/18/23 Goal status: MET  4.  Pt will maintain optimal vocal quality in 5-10 minute unstructured conversations given occasional min A x2 sessions Baseline:  Goal status: PARTIALLY MET   LONG TERM GOALS: Target date: 06/23/2023  Pt will maintain optimal vocal quality in 15+ minute unstructured conversation x2 sessions with mod I Baseline: 06-08-23 Goal status: PARTIALLY MET  2.  Pt will report daily completion of HEP >2 weeks Baseline:  Goal status: MET   3.  Pt will report subjective improvement of vocal quality via PROM by LTG date Baseline: V-RQOL=30; 11 Goal status: MET  ASSESSMENT:  CLINICAL IMPRESSION: Patient is a 77 y.o. M who was seen today for dysphonia. Presents with improved vocal intensity and clarity with trained techniques. Completed education and instruction of vocal exercises today. Pt able to achieve clear voicing in extended discourse with mod I this session. Pt is pleased with current level of functioning and requested ST discharge this date.  OBJECTIVE  IMPAIRMENTS: include voice disorder. These impairments are limiting patient from effectively communicating at home and in community. Factors affecting potential to achieve goals and functional outcome are co-morbidities. Patient will benefit from skilled SLP services to address above impairments and improve overall function.  REHAB POTENTIAL: Good  PLAN:  SLP FREQUENCY: 1x/week  SLP DURATION: 8 weeks  PLANNED INTERVENTIONS: 29562- Speech Eval Behavioral Qualitative Voice Resonance, 13086 Treatment of speech (30 or 45 min) , Cueing hierachy, Functional tasks, SLP instruction and feedback, and Compensatory strategies    Gracy Racer, CCC-SLP 06/08/2023, 9:34 AM

## 2023-06-08 ENCOUNTER — Telehealth: Payer: Self-pay | Admitting: Internal Medicine

## 2023-06-08 ENCOUNTER — Ambulatory Visit: Payer: Medicare PPO

## 2023-06-08 DIAGNOSIS — R498 Other voice and resonance disorders: Secondary | ICD-10-CM

## 2023-06-08 NOTE — Telephone Encounter (Signed)
Pt wants his nurse to reach out to him to let him know when was his last pneumonia shot "she told him once before even though you guys do not do them there.."  Please Advise, Thanks Cb# [phone]

## 2023-06-09 NOTE — Telephone Encounter (Signed)
Called and let Pt know

## 2023-06-14 ENCOUNTER — Encounter: Payer: Medicare PPO | Admitting: Speech Pathology

## 2023-06-28 ENCOUNTER — Other Ambulatory Visit: Payer: Self-pay

## 2023-06-28 ENCOUNTER — Other Ambulatory Visit: Payer: Self-pay | Admitting: Internal Medicine

## 2023-08-09 ENCOUNTER — Ambulatory Visit (INDEPENDENT_AMBULATORY_CARE_PROVIDER_SITE_OTHER): Payer: Medicare PPO

## 2023-08-09 VITALS — Ht 69.0 in | Wt 245.0 lb

## 2023-08-09 DIAGNOSIS — Z0000 Encounter for general adult medical examination without abnormal findings: Secondary | ICD-10-CM

## 2023-08-09 NOTE — Patient Instructions (Signed)
Mr. Vermeer , Thank you for taking time to come for your Medicare Wellness Visit. I appreciate your ongoing commitment to your health goals. Please review the following plan we discussed and let me know if I can assist you in the future.   Referrals/Orders/Follow-Ups/Clinician Recommendations: It was nice talking to you today.  Each day, aim for 6 glasses of water, plenty of protein in your diet and try to get up and walk/ stretch every hour for 5-10 minutes at a time.    This is a list of the screening recommended for you and due dates:  Health Maintenance  Topic Date Due   COVID-19 Vaccine (4 - 2024-25 season) 02/21/2023   Medicare Annual Wellness Visit  08/15/2023   Colon Cancer Screening  03/17/2028   DTaP/Tdap/Td vaccine (3 - Td or Tdap) 12/06/2028   Pneumonia Vaccine  Completed   Flu Shot  Completed   Hepatitis C Screening  Completed   Zoster (Shingles) Vaccine  Completed   HPV Vaccine  Aged Out    Advanced directives: (Declined) Advance directive discussed with you today. Even though you declined this today, please call our office should you change your mind, and we can give you the proper paperwork for you to fill out.  Next Medicare Annual Wellness Visit scheduled for next year: Yes

## 2023-08-09 NOTE — Progress Notes (Signed)
Subjective:   Warren Weeks is a 78 y.o. male who presents for Medicare Annual/Subsequent preventive examination.  Visit Complete: Virtual I connected with  Warren Weeks on 08/09/23 by a audio enabled telemedicine application and verified that I am speaking with the correct person using two identifiers.  Patient Location: Home  Provider Location: Home Office  I discussed the limitations of evaluation and management by telemedicine. The patient expressed understanding and agreed to proceed.  Vital Signs: Because this visit was a virtual/telehealth visit, some criteria may be missing or patient reported. Any vitals not documented were not able to be obtained and vitals that have been documented are patient reported.   Cardiac Risk Factors include: advanced age (>82men, >47 women);male gender;hypertension;obesity (BMI >30kg/m2)     Objective:    Today's Vitals   08/09/23 0852  Weight: 245 lb (111.1 kg)  Height: 5\' 9"  (1.753 m)   Body mass index is 36.18 kg/m.     08/09/2023    9:10 AM 04/28/2023    9:46 AM 02/04/2023    3:17 PM 08/14/2022   10:48 AM 10/14/2021    6:15 AM 08/13/2021   10:56 AM 01/03/2021    8:28 AM  Advanced Directives  Does Patient Have a Medical Advance Directive? No Yes No No No No No  Would patient like information on creating a medical advance directive? No - Patient declined  No - Patient declined No - Patient declined No - Patient declined No - Patient declined No - Patient declined    Current Medications (verified) Outpatient Encounter Medications as of 08/09/2023  Medication Sig   acetaminophen (TYLENOL) 650 MG CR tablet Take 650 mg by mouth at bedtime.   aspirin 81 MG EC tablet Take 1 tablet (81 mg total) by mouth daily. Swallow whole.   Azelastine HCl 137 MCG/SPRAY SOLN SMARTSIG:1-2 Spray(s) Both Nares Twice Daily PRN   Cyanocobalamin (B-12 PO) Take 2,500 mcg by mouth daily.   diclofenac Sodium (VOLTAREN) 1 % GEL Apply 2 g topically daily.    ezetimibe (ZETIA) 10 MG tablet Take 1 tablet by mouth once daily   fluticasone-salmeterol (ADVAIR) 250-50 MCG/ACT AEPB INHALE 1 DOSE BY MOUTH IN THE MORNING AND IN THE EVENING   furosemide (LASIX) 40 MG tablet Take 1 tablet (40 mg total) by mouth daily.   lisinopril (ZESTRIL) 20 MG tablet Take 1 tablet by mouth once daily   Magnesium 250 MG TABS Take 250 mg by mouth daily.   Melatonin 10 MG TABS Take 10 mg by mouth at bedtime.    metoprolol tartrate (LOPRESSOR) 50 MG tablet Take 1 tablet by mouth twice daily   Multiple Vitamin (ONE-A-DAY 55 PLUS PO) Take 1 tablet by mouth daily. Men   Omega-3 Fatty Acids (FISH OIL) 1000 MG CAPS Take 1,000 mg by mouth daily.   omeprazole (PRILOSEC) 20 MG capsule Take 1 capsule by mouth twice daily   potassium chloride SA (KLOR-CON M) 20 MEQ tablet Take 1 tablet (20 mEq total) by mouth daily.   rosuvastatin (CRESTOR) 40 MG tablet Take 1 tablet by mouth once daily   sildenafil (VIAGRA) 100 MG tablet Take 1 tablet (100 mg total) by mouth as needed for erectile dysfunction.   terazosin (HYTRIN) 5 MG capsule Take 5 mg by mouth at bedtime.    Testosterone 20.25 MG/1.25GM (1.62%) GEL Apply 2 Packages topically daily. On each shoulder   thiamine 250 MG tablet Take 250 mg by mouth daily.   Turmeric 500 MG TABS Take  1,000 mg by mouth daily. ginger   Diclofenac Sodium CR 100 MG 24 hr tablet Take 1 tablet by mouth once daily (Patient not taking: Reported on 08/09/2023)   fluticasone (FLONASE) 50 MCG/ACT nasal spray USE 2 SPRAY(S) IN EACH NOSTRIL ONCE DAILY (Patient not taking: Reported on 08/09/2023)   methocarbamol (ROBAXIN) 500 MG tablet Take 1 tablet (500 mg total) by mouth 2 (two) times daily as needed. (Patient not taking: Reported on 08/09/2023)   solifenacin (VESICARE) 5 MG tablet Take 1 tablet (5 mg total) by mouth daily. (Patient not taking: Reported on 08/09/2023)   No facility-administered encounter medications on file as of 08/09/2023.    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   DYSPNEA/SHORTNESS OF BREATH 09/16/2007   ERECTILE DYSFUNCTION 03/28/2007   ESOPHAGEAL STRICTURE 09/16/2007   ESOPHAGITIS 03/24/2007   GERD 03/24/2007   GLUCOSE INTOLERANCE 09/16/2007   HEMORRHOIDS, INTERNAL 03/24/2007   HYPERCHOLESTEROLEMIA 03/24/2007   HYPERLIPIDEMIA 09/16/2007   HYPERTENSION 03/28/2007   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/2011   Impaired glucose tolerance 10/04/2010   LOW BACK PAIN 03/28/2007   MAGNETIC RESONANCE IMAGING, BRAIN, ABNORMAL 06/26/2009   NECK PAIN, LEFT 06/20/2010   OBESITY 03/24/2007   OTITIS MEDIA, LEFT 06/10/2009   Palpitations 06/20/2010   no current problems   PVD 06/25/2009   RASH-NONVESICULAR 06/17/2009   Sleep apnea 12/25/2021   URI 08/16/2009   Past Surgical History:  Procedure Laterality Date   COLONOSCOPY  03/2013   polyps/[place]   LEFT HEART CATH AND CORONARY ANGIOGRAPHY N/A 10/14/2021   Procedure: LEFT HEART CATH AND CORONARY ANGIOGRAPHY;  Surgeon: Corky Crafts, MD;  Location: [facility] INVASIVE CV LAB;  Service: Cardiovascular;  Laterality: N/A;   NASAL SEPTOPLASTY W/ TURBINOPLASTY Bilateral 01/03/2021   Procedure: NASAL SEPTOPLASTY WITH BILATERAL TURBINATE REDUCTION;  Surgeon: Newman Pies, MD;  Location: [facility];  Service: ENT;  Laterality: Bilateral;   RIGHT HEART CATH N/A 02/04/2023   Procedure: RIGHT HEART CATH;  Surgeon: Laurey Morale, MD;  [facility]: [facility] INV[facility] CV LAB;  Service: Cardiovascular;  Laterality: N/A;   s/p right knee arthroscopy     UPPER GASTROINTESTINAL ENDOSCOPY     gerd   Family History  Problem Relation Age of Onset   Lymphoma Mother    Heart disease Father        CHF   Ovarian cancer Sister    Lung cancer Sister    Coronary artery disease Sister        CABG   Coronary artery disease Brother        stent, and CAD   Colon cancer Neg Hx    Esophageal cancer Neg Hx    Rectal cancer Neg Hx    Stomach cancer Neg Hx    Colon polyps Neg Hx    Crohn's disease Neg Hx    Ulcerative colitis Neg Hx    Social History  Socioeconomic History   Marital status: Married    Spouse name: Daisy   Number of children: 2   Years of education: Not on file   Highest education level: Not on file  Occupational History   Occupation: ship and receive Event organiser: [employer]  Tobacco Use   Smoking status: Former    Current packs/day: 0.00    Average packs/day: 0.5 packs/day for 15.0 years (7.5 ttl pk-yrs)    Types: Cigarettes    Start date: 06/22/1976    Quit date: 06/23/1991    Years since quitting: 32.1   Smokeless tobacco: Never   Tobacco comments:    Quit 30 years ago  Vaping Use   Vaping status: Never Used  Substance and Sexual Activity   Alcohol use: Not Currently    Alcohol/week: 7.0 standard drinks of alcohol    Types: 7 Cans of beer per week   Drug use: No   Sexual activity: Yes  Other Topics Concern   Not on file  Social History Narrative   Lives with wife   Social Drivers of Health   Financial Resource Strain: Low Risk  (08/09/2023)   Overall Financial Resource Strain (CARDIA)    Difficulty of Paying Living Expenses: Not very hard  Food Insecurity: No Food Insecurity (08/09/2023)   Hunger Vital Sign    Worried About Running Out of Food in the Last Year: Never true    Ran Out of Food in the Last Year: Never true  Transportation Needs: No Transportation Needs  (08/09/2023)   PRAPARE - Administrator, Civil Service (Medical): No    Lack of Transportation (Non-Medical): No  Physical Activity: Inactive (08/09/2023)   Exercise Vital Sign    Days of Exercise per Week: 0 days    Minutes of Exercise per Session: 0 min  Stress: No Stress Concern Present (08/09/2023)   [employer] of Occupational Health - Occupational Stress Questionnaire    Feeling of Stress : Not at all  Social Connections: Moderately Isolated (08/09/2023)   Social Connection and Isolation Panel [NHANES]    Frequency of Communication with Friends and Family: More than three times a week    Frequency of Social Gatherings with Friends and Family: Twice a week    Attends Religious Services: Never    Database administrator or Organizations: No    Attends Engineer, structural: Never    Marital Status: Married    Tobacco Counseling Counseling given: Not Answered Tobacco comments: Quit 30 years ago   Clinical Intake:  Pre-visit preparation completed: Yes  Pain : No/denies pain     BMI - recorded: 36.18 Nutritional Risks: None Diabetes: No  How often do you need to have someone help you when you read instructions, pamphlets, or other written materials from your doctor or pharmacy?: 1 - Never  Interpreter Needed?: No  Information entered by :: Arye Weyenberg, RMA   Activities of Daily Living    08/09/2023    8:53 AM 08/14/2022   10:53 AM  In your present state of health, do you have any difficulty performing the following activities:  Hearing? 1 0  Vision? 0 0  Difficulty concentrating or making decisions? 0 0  Walking or climbing stairs? 0 0  Dressing or bathing? 0 0  Doing errands, shopping? 0 0  Preparing Food and eating ? N N  Using the Toilet? N N  In the past six months, have you accidently leaked urine? N  N  Do you have problems with loss of bowel control? N N  Managing your Medications? N N  Managing your Finances? N N  Housekeeping  or managing your Housekeeping? N N    Patient Care Team: Corwin Levins, MD as PCP - General Branch, Alben Spittle, MD as PCP - Cardiology (Cardiology) Jethro Bolus, MD as Consulting Physician (Ophthalmology)  Indicate any recent Medical Services you may have received from other than [facility] providers in the past year (date may be approximate).     Assessment:   This is a routine wellness examination for Jocsan.  Hearing/Vision screen Hearing Screening - Comments:: Slight hearing problem Vision Screening - Comments:: Wears eyeglasses   Goals Addressed               This Visit's Progress     DIET - INCREASE WATER INTAKE (pt-stated)        45 oz would like to get up to 64 oz a day      Depression Screen    08/09/2023    9:15 AM 03/11/2023    1:36 PM 10/06/2022    3:59 PM 09/01/2022    3:22 PM 08/14/2022   10:50 AM 07/03/2022   10:55 AM 02/06/2022   10:43 AM  PHQ 2/9 Scores  PHQ - 2 Score 0 2 0 0 0 0 0  PHQ- 9 Score 2 5     5     Fall Risk    08/09/2023    9:10 AM 03/11/2023    1:36 PM 10/06/2022    3:59 PM 09/01/2022    3:22 PM 08/14/2022   10:50 AM  Fall Risk   Falls in the past year? 0 0 0 0 0  Number falls in past yr: 0 0 0 0 0  Injury with Fall? 0 0 0 0 0  Risk for fall due to : No Fall Risks No Fall Risks No Fall Risks No Fall Risks No Fall Risks  Follow up Falls prevention discussed;Falls evaluation completed Falls evaluation completed Falls evaluation completed Falls evaluation completed Falls prevention discussed    MEDICARE RISK AT HOME: Medicare Risk at Home Any stairs in or around the home?: Yes If so, are there any without handrails?: Yes Home free of loose throw rugs in walkways, pet beds, electrical cords, etc?: Yes Adequate lighting in your home to reduce risk of falls?: Yes Life alert?: No Use of a cane, walker or w/c?: Yes Grab bars in the bathroom?: No Shower chair or bench in shower?: No Elevated toilet seat or a handicapped toilet?: No  TIMED UP AND  GO:  Was the test performed?  No    Cognitive Function:        08/09/2023    8:53 AM 08/14/2022   10:50 AM  6CIT Screen  What Year? 0 points 0 points  What month? 0 points 0 points  What time? 0 points 0 points  Count back from 20 0 points 0 points  Months in reverse 0 points 0 points  Repeat phrase 0 points 0 points  Total Score 0 points 0 points    Immunizations Immunization History  Administered Date(s) Administered   Fluad Quad(high Dose 65+) 02/21/2019, 03/12/2021   H1N1 04/22/2008   Influenza Whole 06/10/2009   Influenza, High Dose Seasonal PF 03/20/2016, 03/16/2017, 03/16/2018   Influenza,inj,Quad PF,6+ Mos 04/10/2015   Influenza-Unspecified 03/22/2012, 09/03/2014, 04/22/2020, 04/06/2022   PFIZER(Purple Top)SARS-COV-2 Vaccination 07/24/2019, 08/21/2019, 06/24/2020   Pneumococcal Conjugate-13 12/12/2013   Pneumococcal Polysaccharide-23  12/14/2014   RSV,unspecified 04/06/2022   Td 10/03/2008   Tdap 12/07/2018   Zoster Recombinant(Shingrix) 09/24/2017, 12/20/2017   Zoster, Live 10/12/2012    TDAP status: Up to date  Flu Vaccine status: Up to date  Pneumococcal vaccine status: Up to date  Covid-19 vaccine status: Declined, Education has been provided regarding the importance of this vaccine but patient still declined. Advised may receive this vaccine at local pharmacy or Health Dept.or vaccine clinic. Aware to provide a copy of the vaccination record if obtained from local pharmacy or Health Dept. Verbalized acceptance and understanding.  Qualifies for Shingles Vaccine? Yes   Zostavax completed Yes   Shingrix Completed?: Yes  Screening Tests Health Maintenance  Topic Date Due   COVID-19 Vaccine (4 - 2024-25 season) 02/21/2023   Medicare Annual Wellness (AWV)  08/08/2024   Colonoscopy  03/17/2028   DTaP/Tdap/Td (3 - Td or Tdap) 12/06/2028   Pneumonia Vaccine 27+ Years old  Completed   INFLUENZA VACCINE  Completed   Hepatitis C Screening  Completed    Zoster Vaccines- Shingrix  Completed   HPV VACCINES  Aged Out    Health Maintenance  Health Maintenance Due  Topic Date Due   COVID-19 Vaccine (4 - 2024-25 season) 02/21/2023    Colorectal cancer screening: No longer required.   Lung Cancer Screening: (Low Dose CT Chest recommended if Age 44-80 years, 20 pack-year currently smoking OR have quit w/in 15years.) does not qualify.   Lung Cancer Screening Referral: N/A  Additional Screening:  Hepatitis C Screening: does qualify; Completed 10/24/2015  Vision Screening: Recommended annual ophthalmology exams for early detection of glaucoma and other disorders of the eye. Is the patient up to date with their annual eye exam?  No  Who is the provider or what is the name of the office in which the patient attends annual eye exams? Patient looking for one.  If pt is not established with a provider, would they like to be referred to a provider to establish care? No .   Dental Screening: Recommended annual dental exams for proper oral hygiene   Community Resource Referral / Chronic Care Management: CRR required this visit?  No   CCM required this visit?  No     Plan:     I have personally reviewed and noted the following in the patient's chart:   Medical and social history Use of alcohol, tobacco or illicit drugs  Current medications and supplements including opioid prescriptions. Patient is not currently taking opioid prescriptions. Functional ability and status Nutritional status Physical activity Advanced directives List of other physicians Hospitalizations, surgeries, and ER visits in previous 12 months Vitals Screenings to include cognitive, depression, and falls Referrals and appointments  In addition, I have reviewed and discussed with patient certain preventive protocols, quality metrics, and best practice recommendations. A written personalized care plan for preventive services as well as general preventive health  recommendations were provided to patient.     Jax Abdelrahman L Little [place], CMA   08/09/2023   After Visit Summary: (MyChart) Due to this being a telephonic visit, the after visit summary with patients personalized plan was offered to patient via MyChart   Nurse Notes: Patient is deciding what eye doctor to go to.  He is up to date with his health maintenance.  Patient is concerned about his A1C and would like to get it checked during his up coming visit. He had no other concerns to address today.

## 2023-08-13 DIAGNOSIS — E291 Testicular hypofunction: Secondary | ICD-10-CM | POA: Diagnosis not present

## 2023-08-13 DIAGNOSIS — R948 Abnormal results of function studies of other organs and systems: Secondary | ICD-10-CM | POA: Diagnosis not present

## 2023-08-25 DIAGNOSIS — R351 Nocturia: Secondary | ICD-10-CM | POA: Diagnosis not present

## 2023-08-25 DIAGNOSIS — N401 Enlarged prostate with lower urinary tract symptoms: Secondary | ICD-10-CM | POA: Diagnosis not present

## 2023-08-25 DIAGNOSIS — E291 Testicular hypofunction: Secondary | ICD-10-CM | POA: Diagnosis not present

## 2023-09-08 ENCOUNTER — Ambulatory Visit: Payer: Medicare PPO | Admitting: Internal Medicine

## 2023-09-08 ENCOUNTER — Encounter: Payer: Self-pay | Admitting: Internal Medicine

## 2023-09-08 VITALS — BP 118/66 | HR 55 | Temp 97.9°F | Ht 69.0 in | Wt 242.0 lb

## 2023-09-08 DIAGNOSIS — G4762 Sleep related leg cramps: Secondary | ICD-10-CM

## 2023-09-08 DIAGNOSIS — Z125 Encounter for screening for malignant neoplasm of prostate: Secondary | ICD-10-CM | POA: Diagnosis not present

## 2023-09-08 DIAGNOSIS — I509 Heart failure, unspecified: Secondary | ICD-10-CM

## 2023-09-08 DIAGNOSIS — E538 Deficiency of other specified B group vitamins: Secondary | ICD-10-CM | POA: Diagnosis not present

## 2023-09-08 DIAGNOSIS — Z0001 Encounter for general adult medical examination with abnormal findings: Secondary | ICD-10-CM | POA: Diagnosis not present

## 2023-09-08 DIAGNOSIS — E1165 Type 2 diabetes mellitus with hyperglycemia: Secondary | ICD-10-CM | POA: Diagnosis not present

## 2023-09-08 DIAGNOSIS — E7800 Pure hypercholesterolemia, unspecified: Secondary | ICD-10-CM | POA: Diagnosis not present

## 2023-09-08 DIAGNOSIS — I10 Essential (primary) hypertension: Secondary | ICD-10-CM

## 2023-09-08 DIAGNOSIS — E559 Vitamin D deficiency, unspecified: Secondary | ICD-10-CM

## 2023-09-08 DIAGNOSIS — N1831 Chronic kidney disease, stage 3a: Secondary | ICD-10-CM

## 2023-09-08 DIAGNOSIS — I5189 Other ill-defined heart diseases: Secondary | ICD-10-CM

## 2023-09-08 DIAGNOSIS — R7302 Impaired glucose tolerance (oral): Secondary | ICD-10-CM

## 2023-09-08 DIAGNOSIS — Z7985 Long-term (current) use of injectable non-insulin antidiabetic drugs: Secondary | ICD-10-CM

## 2023-09-08 LAB — URINALYSIS, ROUTINE W REFLEX MICROSCOPIC
Bilirubin Urine: NEGATIVE
Hgb urine dipstick: NEGATIVE
Ketones, ur: NEGATIVE
Leukocytes,Ua: NEGATIVE
Nitrite: NEGATIVE
RBC / HPF: NONE SEEN (ref 0–?)
Specific Gravity, Urine: 1.015 (ref 1.000–1.030)
Total Protein, Urine: NEGATIVE
Urine Glucose: NEGATIVE
Urobilinogen, UA: 0.2 (ref 0.0–1.0)
WBC, UA: NONE SEEN (ref 0–?)
pH: 6 (ref 5.0–8.0)

## 2023-09-08 LAB — HEPATIC FUNCTION PANEL
ALT: 27 U/L (ref 0–53)
AST: 30 U/L (ref 0–37)
Albumin: 4.1 g/dL (ref 3.5–5.2)
Alkaline Phosphatase: 74 U/L (ref 39–117)
Bilirubin, Direct: 0.1 mg/dL (ref 0.0–0.3)
Total Bilirubin: 0.5 mg/dL (ref 0.2–1.2)
Total Protein: 7.3 g/dL (ref 6.0–8.3)

## 2023-09-08 LAB — MICROALBUMIN / CREATININE URINE RATIO
Creatinine,U: 96.4 mg/dL
Microalb Creat Ratio: UNDETERMINED mg/g (ref 0.0–30.0)
Microalb, Ur: <0.7 mg/dL

## 2023-09-08 LAB — BASIC METABOLIC PANEL
BUN: 27 mg/dL — ABNORMAL HIGH (ref 6–23)
CO2: 27 meq/L (ref 19–32)
Calcium: 9.2 mg/dL (ref 8.4–10.5)
Chloride: 107 meq/L (ref 96–112)
Creatinine, Ser: 1.21 mg/dL (ref 0.40–1.50)
GFR: 58.67 mL/min — ABNORMAL LOW (ref >60.00)
Glucose, Bld: 100 mg/dL — ABNORMAL HIGH (ref 70–99)
Potassium: 4.4 meq/L (ref 3.5–5.1)
Sodium: 140 meq/L (ref 135–145)

## 2023-09-08 LAB — VITAMIN B12: Vitamin B-12: >1537 pg/mL — ABNORMAL HIGH (ref 211–911)

## 2023-09-08 LAB — VITAMIN D 25 HYDROXY (VIT D DEFICIENCY, FRACTURES): VITD: 27.84 ng/mL — ABNORMAL LOW (ref 30.00–100.00)

## 2023-09-08 LAB — CBC WITH DIFFERENTIAL/PLATELET
Basophils Absolute: 0 10*3/uL (ref 0.0–0.1)
Basophils Relative: 0.8 % (ref 0.0–3.0)
Eosinophils Absolute: 0.3 10*3/uL (ref 0.0–0.7)
Eosinophils Relative: 8 % — ABNORMAL HIGH (ref 0.0–5.0)
HCT: 38.5 % — ABNORMAL LOW (ref 39.0–52.0)
Hemoglobin: 12.7 g/dL — ABNORMAL LOW (ref 13.0–17.0)
Lymphocytes Relative: 29.5 % (ref 12.0–46.0)
Lymphs Abs: 1.3 10*3/uL (ref 0.7–4.0)
MCHC: 33 g/dL (ref 30.0–36.0)
MCV: 91.3 fl (ref 78.0–100.0)
Monocytes Absolute: 0.5 10*3/uL (ref 0.1–1.0)
Monocytes Relative: 11.5 % (ref 3.0–12.0)
Neutro Abs: 2.1 10*3/uL (ref 1.4–7.7)
Neutrophils Relative %: 50.2 % (ref 43.0–77.0)
Platelets: 126 10*3/uL — ABNORMAL LOW (ref 150.0–400.0)
RBC: 4.21 Mil/uL — ABNORMAL LOW (ref 4.22–5.81)
RDW: 14.5 % (ref 11.5–15.5)
WBC: 4.3 10*3/uL (ref 4.0–10.5)

## 2023-09-08 LAB — LIPID PANEL
Cholesterol: 140 mg/dL (ref 0–200)
HDL: 48 mg/dL (ref >39.00)
LDL Cholesterol: 76 mg/dL (ref 0–99)
NonHDL: 92.15
Total CHOL/HDL Ratio: 3
Triglycerides: 83 mg/dL (ref 0.0–149.0)
VLDL: 16.6 mg/dL (ref 0.0–40.0)

## 2023-09-08 LAB — TSH: TSH: 3.11 u[IU]/mL (ref 0.35–5.50)

## 2023-09-08 LAB — HEMOGLOBIN A1C: Hgb A1c MFr Bld: 6.5 % (ref 4.6–6.5)

## 2023-09-08 LAB — MAGNESIUM: Magnesium: 2.2 mg/dL (ref 1.5–2.5)

## 2023-09-08 LAB — PSA: PSA: 0.21 ng/mL (ref 0.10–4.00)

## 2023-09-08 MED ORDER — TIRZEPATIDE 2.5 MG/0.5ML ~~LOC~~ SOAJ: 2.5000 mg | SUBCUTANEOUS | 11 refills | Status: DC

## 2023-09-08 MED ORDER — METHOCARBAMOL 500 MG PO TABS: 500.0000 mg | ORAL_TABLET | Freq: Two times a day (BID) | ORAL | 2 refills | Status: AC | PRN

## 2023-09-08 NOTE — Patient Instructions (Signed)
 Please continue all other medications as before, and refills have been done if requested.  Please have the pharmacy call with any other refills you may need.  Please continue your efforts at being more active, low cholesterol diet, and weight control.  You are otherwise up to date with prevention measures today.  Please keep your appointments with your specialists as you may have planned  You will be contacted regarding the referral for: Cardiology  Please go to the LAB at the blood drawing area for the tests to be done  You will be contacted by phone if any changes need to be made immediately.  Otherwise, you will receive a letter about your results with an explanation, but please check with MyChart first.  Please make an Appointment to return in 6 months, or sooner if needed

## 2023-09-08 NOTE — Progress Notes (Signed)
 Patient ID: Warren Weeks, male   DOB: 04-07-1946, 78 y.o.   MRN: [id number]         Chief Complaint:: wellness exam and low vit d, hyperglycemia, hld, htn, ckd3a       HPI:  Warren Weeks is a 78 y.o. male here for wellness exam; up to date                Also Pt denies chest pain, increased sob or doe, wheezing, orthopnea, PND, increased LE swelling, palpitations, dizziness or syncope.   Pt denies polydipsia, polyuria, or new focal neuro s/s.    Pt denies fever, wt loss, night sweats, loss of appetite, or other constitutional symptoms  Does have leg cramps at night often, also follows with ortho dr Roda Shutters for left wrist pain, right index finger, left shoulder, and bilateral knee arthritis.  His cardiologist has retired, needs new referral   Wt Readings from Last 3 Encounters:  09/08/23 242 lb (109.8 kg)  08/09/23 245 lb (111.1 kg)  03/24/23 235 lb (106.6 kg)   BP Readings from Last 3 Encounters:  09/08/23 118/66  03/24/23 134/66  03/18/23 (!) 127/91   Immunization History  Administered Date(s) Administered   Fluad Quad(high Dose 65+) 02/21/2019, 03/12/2021   Fluad Trivalent(High Dose 65+) 06/08/2023   H1N1 04/22/2008   Influenza Whole 06/10/2009   Influenza, High Dose Seasonal PF 03/20/2016, 03/16/2017, 03/16/2018   Influenza,inj,Quad PF,6+ Mos 04/10/2015   Influenza-Unspecified 03/22/2012, 09/03/2014, 04/22/2020, 04/06/2022   PFIZER(Purple Top)SARS-COV-2 Vaccination 07/24/2019, 08/21/2019, 06/24/2020   Pneumococcal Conjugate-13 12/12/2013   Pneumococcal Polysaccharide-23 12/14/2014   RSV,unspecified 04/06/2022   Td 10/03/2008   Tdap 12/07/2018   Zoster Recombinant(Shingrix) 09/24/2017, 12/20/2017   Zoster, Live 10/12/2012   Health Maintenance Due  Topic Date Due   OPHTHALMOLOGY EXAM  Never done   FOOT EXAM  09/13/2020      Past Medical History:  Diagnosis Date   ALLERGIC RHINITIS 09/16/2007   ALLERGY 03/24/2007   Allergy    ANXIETY 03/28/2007   Arthritis    knees,  back,shoulder   CAROTID ARTERY STENOSIS, BILATERAL 07/01/2009   yearly checks - no current problems   Cataract    bilateral,removed   CEPHALGIA 06/08/2009   CHEST PAIN 09/16/2007   no current problems   COLONIC POLYPS, HX OF 09/16/2007   COPD (chronic obstructive pulmonary disease) (HCC) 05/12/2017   mild per patient - no inhaler   Dizziness and giddiness 06/08/2009   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   DYSPNEA/SHORTNESS OF BREATH 09/16/2007   ERECTILE DYSFUNCTION 03/28/2007   ESOPHAGEAL STRICTURE 09/16/2007   ESOPHAGITIS 03/24/2007   GERD 03/24/2007   GLUCOSE INTOLERANCE 09/16/2007   HEMORRHOIDS, INTERNAL 03/24/2007   HYPERCHOLESTEROLEMIA 03/24/2007   HYPERLIPIDEMIA 09/16/2007   HYPERTENSION 03/28/2007   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/2011   Impaired glucose tolerance 10/04/2010   LOW BACK PAIN 03/28/2007   MAGNETIC RESONANCE IMAGING, BRAIN, ABNORMAL 06/26/2009   NECK PAIN, LEFT 06/20/2010   OBESITY 03/24/2007   OTITIS MEDIA, LEFT 06/10/2009   Palpitations 06/20/2010   no current problems   PVD 06/25/2009   RASH-NONVESICULAR 06/17/2009   Sleep apnea 12/25/2021   URI 08/16/2009   Past Surgical History:  Procedure Laterality Date   COLONOSCOPY  03/2013   polyps/[place]   LEFT HEART CATH AND CORONARY ANGIOGRAPHY N/A 10/14/2021   Procedure: LEFT HEART CATH AND CORONARY ANGIOGRAPHY;  Surgeon: Corky Crafts, MD;  Location: [facility];  Service: Cardiovascular;  Laterality: N/A;   NASAL SEPTOPLASTY W/ TURBINOPLASTY Bilateral  01/03/2021   Procedure: NASAL SEPTOPLASTY WITH BILATERAL TURBINATE REDUCTION;  Surgeon: Newman Pies, MD;  Location: [facility];  Service: ENT;  Laterality: Bilateral;   RIGHT HEART CATH N/A 02/04/2023   Procedure: RIGHT HEART CATH;  Surgeon: Laurey Morale, MD;  Location: [facility] INVASIVE CV LAB;  Service: Cardiovascular;  Laterality: N/A;   s/p right knee arthroscopy     UPPER GASTROINTESTINAL ENDOSCOPY     gerd    reports that he quit  smoking about 32 years ago. His smoking use included cigarettes. He started smoking about 47 years ago. He has a 7.5 pack-year smoking history. He has never used smokeless tobacco. He reports that he does not currently use alcohol after a past usage of about 7.0 standard drinks of alcohol per week. He reports that he does not use drugs. family history includes Coronary artery disease in his brother and sister; Heart disease in his father; Lung cancer in his sister; Lymphoma in his mother; Ovarian cancer in his sister. Allergies  Allergen Reactions   Cefuroxime Axetil Rash   Current Outpatient Medications on File Prior to Visit  Medication Sig Dispense Refill   acetaminophen (TYLENOL) 650 MG CR tablet Take 650 mg by mouth at bedtime.     aspirin 81 MG EC tablet Take 1 tablet (81 mg total) by mouth daily. Swallow whole. 30 tablet 12   Azelastine HCl 137 MCG/SPRAY SOLN SMARTSIG:1-2 Spray(s) Both Nares Twice Daily PRN     Cyanocobalamin (B-12 PO) Take 2,500 mcg by mouth daily.     diclofenac Sodium (VOLTAREN) 1 % GEL Apply 2 g topically daily.     ezetimibe (ZETIA) 10 MG tablet Take 1 tablet by mouth once daily 90 tablet 3   fluticasone-salmeterol (ADVAIR) 250-50 MCG/ACT AEPB INHALE 1 DOSE BY MOUTH IN THE MORNING AND IN THE EVENING 60 each 11   furosemide (LASIX) 40 MG tablet Take 1 tablet (40 mg total) by mouth daily. 60 tablet 3   lisinopril (ZESTRIL) 20 MG tablet Take 1 tablet by mouth once daily 90 tablet 3   Magnesium 250 MG TABS Take 250 mg by mouth daily.     Melatonin 10 MG TABS Take 10 mg by mouth at bedtime.      metoprolol tartrate (LOPRESSOR) 50 MG tablet Take 1 tablet by mouth twice daily 180 tablet 3   Multiple Vitamin (ONE-A-DAY 55 PLUS PO) Take 1 tablet by mouth daily. Men     Omega-3 Fatty Acids (FISH OIL) 1000 MG CAPS Take 1,000 mg by mouth daily.     omeprazole (PRILOSEC) 20 MG capsule Take 1 capsule by mouth twice daily 180 capsule 3   potassium chloride SA (KLOR-CON M) 20 MEQ  tablet Take 1 tablet (20 mEq total) by mouth daily. 60 tablet 3   rosuvastatin (CRESTOR) 40 MG tablet Take 1 tablet by mouth once daily 90 tablet 3   sildenafil (VIAGRA) 100 MG tablet Take 1 tablet (100 mg total) by mouth as needed for erectile dysfunction. 10 tablet 11   terazosin (HYTRIN) 5 MG capsule Take 5 mg by mouth at bedtime.      Testosterone 20.25 MG/1.25GM (1.62%) GEL Apply 2 Packages topically daily. On each shoulder     thiamine 250 MG tablet Take 250 mg by mouth daily.     Turmeric 500 MG TABS Take 1,000 mg by mouth daily. ginger     Diclofenac Sodium CR 100 MG 24 hr tablet Take 1 tablet by mouth once daily (Patient not  taking: Reported on 08/09/2023) 90 tablet 1   fluticasone (FLONASE) 50 MCG/ACT nasal spray USE 2 SPRAY(S) IN EACH NOSTRIL ONCE DAILY (Patient not taking: Reported on 09/08/2023) 16 g 0   solifenacin (VESICARE) 5 MG tablet Take 1 tablet (5 mg total) by mouth daily. (Patient not taking: Reported on 08/09/2023) 90 tablet 3   No current facility-administered medications on file prior to visit.        ROS:  All others reviewed and negative.  Objective        PE:  BP 118/66 (BP Location: Right Arm, Patient Position: Sitting, Cuff Size: Normal)   Pulse (!) 55   Temp 97.9 F (36.6 C) (Oral)   Ht 5\' 9"  (1.753 m)   Wt 242 lb (109.8 kg)   SpO2 93%   BMI 35.74 kg/m                 Constitutional: Pt appears in NAD               HENT: Head: NCAT.                Right Ear: External ear normal.                 Left Ear: External ear normal.                Eyes: . Pupils are equal, round, and reactive to light. Conjunctivae and EOM are normal               Nose: without d/c or deformity               Neck: Neck supple. Gross normal ROM               Cardiovascular: Normal rate and regular rhythm.                 Pulmonary/Chest: Effort normal and breath sounds without rales or wheezing.                Abd:  Soft, NT, ND, + BS, no organomegaly                Neurological: Pt is alert. At baseline orientation, motor grossly intact               Skin: Skin is warm. No rashes, no other new lesions, LE edema - trace to 1+ bilateral               Psychiatric: Pt behavior is normal without agitation   Micro: none  Cardiac tracings I have personally interpreted today:  none  Pertinent Radiological findings (summarize): none   Lab Results  Component Value Date   WBC 4.3 09/08/2023   HGB 12.7 (L) 09/08/2023   HCT 38.5 (L) 09/08/2023   PLT 126.0 (L) 09/08/2023   GLUCOSE 100 (H) 09/08/2023   CHOL 140 09/08/2023   TRIG 83.0 09/08/2023   HDL 48.00 09/08/2023   LDLDIRECT 176.0 04/03/2011   LDLCALC 76 09/08/2023   ALT 27 09/08/2023   AST 30 09/08/2023   NA 140 09/08/2023   K 4.4 09/08/2023   CL 107 09/08/2023   CREATININE 1.21 09/08/2023   BUN 27 (H) 09/08/2023   CO2 27 09/08/2023   TSH 3.11 09/08/2023   PSA 0.21 09/08/2023   HGBA1C 6.5 09/08/2023   MICROALBUR <0.7 09/08/2023   Assessment/Plan:  Jacobo Moncrief Kucinski is a 78 y.o. Black or African American [2] male with  has a  past medical history of ALLERGIC RHINITIS (09/16/2007), ALLERGY (03/24/2007), Allergy, ANXIETY (03/28/2007), Arthritis, CAROTID ARTERY STENOSIS, BILATERAL (07/01/2009), Cataract, CEPHALGIA (06/08/2009), CHEST PAIN (09/16/2007), COLONIC POLYPS, HX OF (09/16/2007), COPD (chronic obstructive pulmonary disease) (HCC) (05/12/2017), Dizziness and giddiness (06/08/2009), Dyshidrotic eczema (06/03/2018), DYSPNEA/SHORTNESS OF BREATH (09/16/2007), ERECTILE DYSFUNCTION (03/28/2007), ESOPHAGEAL STRICTURE (09/16/2007), ESOPHAGITIS (03/24/2007), GERD (03/24/2007), GLUCOSE INTOLERANCE (09/16/2007), HEMORRHOIDS, INTERNAL (03/24/2007), HYPERCHOLESTEROLEMIA (03/24/2007), HYPERLIPIDEMIA (09/16/2007), HYPERTENSION (03/28/2007), Hypogonadism male (10/09/2011), Impaired glucose tolerance (10/04/2010), LOW BACK PAIN (03/28/2007), MAGNETIC RESONANCE IMAGING, BRAIN, ABNORMAL (06/26/2009), NECK PAIN, LEFT  (06/20/2010), OBESITY (03/24/2007), OTITIS MEDIA, LEFT (06/10/2009), Palpitations (06/20/2010), PVD (06/25/2009), RASH-NONVESICULAR (06/17/2009), Sleep apnea (12/25/2021), and URI (08/16/2009).  Encounter for well adult exam with abnormal findings Age and sex appropriate education and counseling updated with regular exercise and diet Referrals for preventative services - none needed Immunizations addressed - none needed Smoking counseling  - none needed Evidence for depression or other mood disorder - none significant Most recent labs reviewed. I have personally reviewed and have noted: 1) the patient's medical and social history 2) The patient's current medications and supplements 3) The patient's height, weight, and BMI have been recorded in the chart   Vitamin D deficiency Last vitamin D Lab Results  Component Value Date   VD25OH 27.84 (L) 09/08/2023   Low, to start oral replacement   Diabetes (HCC) Lab Results  Component Value Date   HGBA1C 6.5 09/08/2023   With uncontrolled obesity,, pt to continue current medical treatment mounjaro 2.5 mg weekly with intent to titrate   Hyperlipidemia Lab Results  Component Value Date   LDLCALC 76 09/08/2023   Uncontrolled, , pt to continue current statin crestor 40 every day, zetia 10 every day and lower chol diet, declines change for now   Essential hypertension BP Readings from Last 3 Encounters:  09/08/23 118/66  03/24/23 134/66  03/18/23 (!) 127/91   Stable, pt to continue medical treatment lisinopril 20 every day, lopressor 50 bid   CKD (chronic kidney disease) Lab Results  Component Value Date   CREATININE 1.21 09/08/2023   Stable overall, cont to avoid nephrotoxins   Diastolic dysfunction And pulm htn - refer to cardiology per pt request  Nocturnal leg cramps Pt for muscle relaxer prn qhs  Followup: Return in about 6 months (around 03/10/2024).  Oliver Barre, MD 09/11/2023 6:09 PM [facility]

## 2023-09-08 NOTE — Progress Notes (Signed)
 The test results show that your current treatment is OK, as the tests are stable.  Please continue the same plan.  There is no other need for change of treatment or further evaluation based on these results, at this time.  thanks

## 2023-09-10 ENCOUNTER — Telehealth: Payer: Self-pay | Admitting: Pharmacy Technician

## 2023-09-10 ENCOUNTER — Other Ambulatory Visit (HOSPITAL_COMMUNITY): Payer: Self-pay

## 2023-09-10 NOTE — Telephone Encounter (Signed)
 Pharmacy Patient Advocate Encounter  Received notification from [facility] that Prior Authorization for [facility] 2.5MG /0.5ML has been APPROVED from 06/23/23 to 06/21/24. Ran test claim, Copay is $40.00. This test claim was processed through [facility]- copay amounts may vary at other pharmacies due to pharmacy/plan contracts, or as the patient moves through the different stages of their insurance plan.   PA #/Case ID/Reference #: [id number]

## 2023-09-11 ENCOUNTER — Encounter: Payer: Self-pay | Admitting: Internal Medicine

## 2023-09-11 DIAGNOSIS — G4762 Sleep related leg cramps: Secondary | ICD-10-CM | POA: Insufficient documentation

## 2023-09-11 NOTE — Assessment & Plan Note (Signed)
 Last vitamin D Lab Results  Component Value Date   VD25OH 27.84 (L) 09/08/2023   Low, to start oral replacement

## 2023-09-11 NOTE — Assessment & Plan Note (Signed)
 BP Readings from Last 3 Encounters:  09/08/23 118/66  03/24/23 134/66  03/18/23 (!) 127/91   Stable, pt to continue medical treatment lisinopril 20 every day, lopressor 50 bid

## 2023-09-11 NOTE — Assessment & Plan Note (Addendum)
 Lab Results  Component Value Date   HGBA1C 6.5 09/08/2023   With uncontrolled obesity,, pt to continue current medical treatment mounjaro 2.5 mg weekly with intent to titrate

## 2023-09-11 NOTE — Assessment & Plan Note (Signed)
 And pulm htn - refer to cardiology per pt request

## 2023-09-11 NOTE — Assessment & Plan Note (Signed)
 Pt for muscle relaxer prn qhs

## 2023-09-11 NOTE — Assessment & Plan Note (Addendum)
 Lab Results  Component Value Date   LDLCALC 76 09/08/2023   Uncontrolled, , pt to continue current statin crestor 40 every day, zetia 10 every day and lower chol diet, declines change for now

## 2023-09-11 NOTE — Assessment & Plan Note (Signed)
 Lab Results  Component Value Date   CREATININE 1.21 09/08/2023   Stable overall, cont to avoid nephrotoxins

## 2023-09-11 NOTE — Assessment & Plan Note (Signed)

## 2023-09-14 DIAGNOSIS — G4733 Obstructive sleep apnea (adult) (pediatric): Secondary | ICD-10-CM | POA: Diagnosis not present

## 2023-09-28 ENCOUNTER — Ambulatory Visit: Attending: Cardiology | Admitting: Cardiology

## 2023-09-28 ENCOUNTER — Encounter: Payer: Self-pay | Admitting: Cardiology

## 2023-09-28 VITALS — BP 120/74 | HR 60 | Ht 69.0 in | Wt 245.2 lb

## 2023-09-28 DIAGNOSIS — I10 Essential (primary) hypertension: Secondary | ICD-10-CM

## 2023-09-28 DIAGNOSIS — I493 Ventricular premature depolarization: Secondary | ICD-10-CM

## 2023-09-28 DIAGNOSIS — R079 Chest pain, unspecified: Secondary | ICD-10-CM

## 2023-09-28 DIAGNOSIS — I272 Pulmonary hypertension, unspecified: Secondary | ICD-10-CM | POA: Diagnosis not present

## 2023-09-28 DIAGNOSIS — R0609 Other forms of dyspnea: Secondary | ICD-10-CM | POA: Diagnosis not present

## 2023-09-28 DIAGNOSIS — G4733 Obstructive sleep apnea (adult) (pediatric): Secondary | ICD-10-CM

## 2023-09-28 DIAGNOSIS — I2510 Atherosclerotic heart disease of native coronary artery without angina pectoris: Secondary | ICD-10-CM | POA: Diagnosis not present

## 2023-09-28 DIAGNOSIS — I872 Venous insufficiency (chronic) (peripheral): Secondary | ICD-10-CM

## 2023-09-28 DIAGNOSIS — E7800 Pure hypercholesterolemia, unspecified: Secondary | ICD-10-CM

## 2023-09-28 DIAGNOSIS — I6523 Occlusion and stenosis of bilateral carotid arteries: Secondary | ICD-10-CM

## 2023-09-28 NOTE — Patient Instructions (Addendum)
 Medication Instructions:   No changes  *If you need a refill on your cardiac medications before your next appointment, please call your pharmacy*   Lab Work: Not needed    Testing/Procedures: Will be schedule  now [address]  Your physician has requested that you have a lower extremity venous duplex ( for reflux) . This test is an ultrasound of the veins in the legs . It looks at venous blood flow that carries blood from the heart to the legs . Allow one hour for a Lower Venous exam.There are no restrictions or special instructions.  Please note: We ask at that you not bring children with you during ultrasound (echo/ vascular) testing. Due to room size and safety concerns, children are not allowed in the ultrasound rooms during exams. Our front office staff cannot provide observation of children in our lobby area while testing is being conducted. An adult accompanying a patient to their appointment will only be allowed in the ultrasound room at the discretion of the ultrasound technician under special circumstances. We apologize for any inconvenience.     Will be schedule in Aug  2025 ---[address]  Your physician has requested that you have a carotid duplex. This test is an ultrasound of the carotid arteries in your neck. It looks at blood flow through these arteries that supply the brain with blood. Allow one hour for this exam. There are no restrictions or special instructions.     Follow-Up: At [facility], you and your health needs are our priority.  As part of our continuing mission to provide you with exceptional heart care, we have created designated Provider Care Teams.  These Care Teams include your primary Cardiologist (physician) and Advanced Practice Providers (APPs -  Physician Assistants and Nurse Practitioners) who all work together to provide you with the care you need, when you need it.     Your next appointment:   12 month(s)  The format for  your next appointment:   In Person  Provider:   Bryan Lemma, MD    Other Instructions  s

## 2023-09-28 NOTE — Progress Notes (Unsigned)
 Cardiology Office Note:  .   Date:  09/29/2023  ID:  Jerene Pitch, DOB [birth year], MRN [id number] PCP: Corwin Levins, MD  [facility] Providers Cardiologist:  Bryan Lemma, MD    Pulmonologist: Dr. Aldean Ast / Dr. Judeth Horn  Chief Complaint  Patient presents with   Follow-up    Establish new cardiologist.  No active cardiac issues    Patient Profile: .     Warren Weeks is an obese 78 y.o. 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  He returns at the request of Corwin Levins, MD.   Warren Weeks was last seen on January 12, 2022 by Carolan Clines, MD for follow-up to discuss elevated coronary calcium score (1273) and hyperlipidemia.  He was denying any anginal symptoms no PND orthopnea.  Rare palpitations.  There was concern because his father had CHF and died age 40 and his sister had CABG and passed away at 7.  The main symptom he noted was some burning sensation in his chest with walking up steps. => He had been evaluated with heart catheterization showing minimal CAD, and a relatively normal echocardiogram with the session mildly elevated PAP.  He was referred by Micheline Maze, NP from Pulmonary Medicine for repeat Echocardiogram and Right Heart Catheterization performed by Dr. Shirlee Latch in August 2024. => Was started on furosemide for elevated PCWP suggesting diastolic dysfunction.  Post cath he was noted to have mild oxygen desaturation with 6-minute walk but did not require oxygen.  He was just seen by Dr. Jonny Ruiz on March 19.  Still denying any chest pain or increased or exertional dyspnea with exertion.  No PND orthopnea just mild swelling.  No weight changes.  Because Dr. Wyline Mood is leaving, present for new referral  Subjective  Warren Weeks presents here  today for his first time for me seeing him.  He still notes that intermittent burning sensation in his chest with or with activity that seems to be a stable thing for him.  He really does not necessarily notice significant exertional dyspnea but he is somewhat limited by musculoskeletal issues.  Besides some mild swelling that that is there without significant PND or orthopnea, he does fairly well.  The swelling does go down mostly when he puts his feet up and takes his furosemide.  He has baseline exertional dyspnea but no worsening symptoms now.  No chest pain or pressure at rest or exertion.  Likewise, he denies any rapid irregular heartbeats or palpitations.  No syncope or near syncope or TIA or emesis BX.  Cardiovascular ROS: positive for - dyspnea on exertion, irregular heartbeat, and occasional chest burning but not exertional and no exertional or resting chest tightness or pressure; baseline exertional dyspnea, limited by MSK issues.. negative for - irregular heartbeat, orthopnea, palpitations, paroxysmal nocturnal dyspnea, rapid heart rate, shortness of breath, or lightheadedness dizziness or wooziness, syncope or near syncope TIA or emesis BSL claudication  ROS:  Review of Systems - Negative except symptoms noted above.  No real prominent cough or wheezing.  Mostly notes MSK symptoms.  And ended a swelling.    Objective    Family Hx: Father had CHF at 60. Sister had CABG. Sister passed 89   Social Hx: quit 30 years ago. 20 years smoking.   Studies  Reviewed: Marland Kitchen   EKG Interpretation Date/Time:  Tuesday September 28 2023 11:39:55 EDT Ventricular Rate:  60 PR Interval:  128 QRS Duration:  78 QT Interval:  452 QTC Calculation: 452 R Axis:   40  Text Interpretation: Normal sinus rhythm Cannot rule out Anterior infarct , age undetermined When compared with ECG of 04-Feb-2023 15:19, Premature ventricular complexes are no longer Present Confirmed by Bryan Lemma ([id number]) on 09/28/2023  11:57:34 AM    CAC 1273 (09/2021) Left Heart Cath 10/14/2021: Normal EF.  Nonobstructive CAD with minimal atherosclerosis. ECHO 11/11/2021: Normal LV size and function with EF of 55 to 60%.  GR 1 DD.  Mildly elevated P AP.  Moderate Bi-atrial dilation; normal valves with history of moderate aortic calcification/sclerosis.  Normal RAP. -.  ECHO 01/13/2023: EF 55 to 60%.  No RWMA.  GR 1 DD.  Moderately elevated PAP of 46.6 mmHg.  Mild LA dilation.  Mild MR.  Moderate AOV sclerosis with mild AS (mean gradient 11 mmHg) Right Heart Cath 02/04/2023: Mild pulmonary venous hypertension.  RAP 5, RVP 41/8, PAP 48.13 with a mean of 25 mmHg.  PCWP 17 mmHg.   Risk Assessment/Calculations:            Physical Exam:   VS:  BP 120/74 (BP Location: Left Arm, Patient Position: Sitting, Cuff Size: Normal)   Pulse 60   Ht 5\' 9"  (1.753 m)   Wt 245 lb 3.2 oz (111.2 kg)   SpO2 97%   BMI 36.21 kg/m    Wt Readings from Last 3 Encounters:  09/28/23 245 lb 3.2 oz (111.2 kg)  09/08/23 242 lb (109.8 kg)  08/09/23 245 lb (111.1 kg)    GEN: Well nourished, well developed in no acute distress; moderately obese; walks with cane NECK: No JVD; No carotid bruits CARDIAC: Normal S1, S2; RRR, no murmurs, rubs, gallops RESPIRATORY:  Clear to auscultation without rales, wheezing or rhonchi ; nonlabored, good air movement. ABDOMEN: Soft, non-tender, non-distended EXTREMITIES: Trace to 1+ v and 1+ trivial edema on the right.  Orthotic blood pressure monitored.; No deformity      ASSESSMENT AND PLAN: .    Problem List Items Addressed This Visit       Cardiology Problems   Carotid artery stenosis (Chronic)   Relevant Orders   VAS US CAROTID   Coronary artery disease, non-occlusive (Chronic)   No active bleeding anginal symptoms and a relatively normal cardiac catheterization about 2 years ago with no notable plaque.   - Continue aspirin, moderate dose beta-blocker along with ACE inhibitor for afterload  reduction. -Continue current dose of statin and Zetia -Is chest pain symptoms are more likely musculoskeletal in nature and not so much related to cardiac etiology.  Continue to monitor.      Relevant Orders   VAS US CAROTID   VAS [place] LOWER EXTREMITY VENOUS REFLUX   Essential hypertension (Chronic)   Blood pressure today was outstanding whereas previous results have been somewhat related.  For now continue current dose of lisinopril 20 mg daily, Lopressor 50 mg twice daily.      Hyperlipidemia - Primary (Chronic)   Continue to follow lipid panel most recent labs showed LDL of 76.  Pretty well-controlled given his evidence of CAD but nonobstructive.  Continue with 20 mg rosuvastatin.      Relevant Orders   EKG 12-Lead (Completed)   Pulmonary hypertension (HCC) (Chronic)   WHO-2 findings with diastolic dysfunction with relatively unremarkable right heart catheter results. PAP by echo  was estimated at 46 mercury which was of similar to peak PAP on right heart cath.  PAP mean however was 25 any mild pulm hypertension  Plan: Continue combination of lisinopril 10 mg daily, Lopressor 50 mg twice daily and furosemide 40 mg daily for now.  Watch for signs of volume overload or volume contraction and hypertension.  Continue furosemide standing dose and additional doses as needed for weight gain      PVC's (premature ventricular contractions)   Not overly significant rhythm with ectopy.  For now, we will hold off on titrating up his beta-blocker unless he becomes symptomatic. - Continue moderate dose Toprol tartrate 50 mg twice daily      Venous insufficiency (Chronic)   Significant unilateral leg swelling (right more than left. .- recommend foot elevation and reconsider use of compression sleeves or compression socks to help improve venous return. - Support stockings, foot elevation and refill Lasix. -> Check lower extremity venous Dopplers       Relevant Orders   VAS [place] LOWER  EXTREMITY VENOUS REFLUX     Other   Chest pain   Most likely musculoskeletal in nature.      DOE (dyspnea on exertion)   Sleep apnea       Follow-Up: Return in about 1 year (around 09/27/2024) for 1 Yr Follow-up.    Signed, Marykay Lex, MD, MS Bryan Lemma, M.D., M.S. Interventional Cardiologist  [facility]  Pager # [phone] Phone # [phone] [address] [place], [place] [address]

## 2023-09-29 ENCOUNTER — Encounter: Payer: Self-pay | Admitting: Cardiology

## 2023-09-29 NOTE — Assessment & Plan Note (Signed)
 WHO-2 findings with diastolic dysfunction with relatively unremarkable right heart catheter results. PAP by echo was estimated at 46 mercury which was of similar to peak PAP on right heart cath.  PAP mean however was 25 any mild pulm hypertension  Plan: Continue combination of lisinopril 10 mg daily, Lopressor 50 mg twice daily and furosemide 40 mg daily for now.  Watch for signs of volume overload or volume contraction and hypertension.  Continue furosemide standing dose and additional doses as needed for weight gain

## 2023-09-29 NOTE — Assessment & Plan Note (Signed)
 Blood pressure today was outstanding whereas previous results have been somewhat related.  For now continue current dose of lisinopril 20 mg daily, Lopressor 50 mg twice daily.

## 2023-09-29 NOTE — Assessment & Plan Note (Signed)
 No active bleeding anginal symptoms and a relatively normal cardiac catheterization about 2 years ago with no notable plaque.   - Continue aspirin, moderate dose beta-blocker along with ACE inhibitor for afterload reduction. -Continue current dose of statin and Zetia -Is chest pain symptoms are more likely musculoskeletal in nature and not so much related to cardiac etiology.  Continue to monitor.

## 2023-09-29 NOTE — Assessment & Plan Note (Signed)
 Continue to follow lipid panel most recent labs showed LDL of 76.  Pretty well-controlled given his evidence of CAD but nonobstructive.  Continue with 20 mg rosuvastatin.

## 2023-09-29 NOTE — Assessment & Plan Note (Signed)
 Most likely musculoskeletal in nature.

## 2023-09-29 NOTE — Assessment & Plan Note (Signed)
 Not overly significant rhythm with ectopy.  For now, we will hold off on titrating up his beta-blocker unless he becomes symptomatic. - Continue moderate dose Toprol tartrate 50 mg twice daily

## 2023-09-29 NOTE — Assessment & Plan Note (Addendum)
 Significant unilateral leg swelling (right more than left. .- recommend foot elevation and reconsider use of compression sleeves or compression socks to help improve venous return. - Support stockings, foot elevation and refill Lasix. -> Check lower extremity venous Dopplers

## 2023-09-30 ENCOUNTER — Ambulatory Visit: Payer: Medicare PPO | Admitting: Pulmonary Disease

## 2023-09-30 ENCOUNTER — Encounter: Payer: Self-pay | Admitting: Pulmonary Disease

## 2023-09-30 VITALS — BP 128/78 | HR 83 | Ht 69.0 in | Wt 245.0 lb

## 2023-09-30 DIAGNOSIS — U071 COVID-19: Secondary | ICD-10-CM

## 2023-09-30 DIAGNOSIS — J309 Allergic rhinitis, unspecified: Secondary | ICD-10-CM | POA: Diagnosis not present

## 2023-09-30 DIAGNOSIS — G4733 Obstructive sleep apnea (adult) (pediatric): Secondary | ICD-10-CM | POA: Diagnosis not present

## 2023-09-30 DIAGNOSIS — J4530 Mild persistent asthma, uncomplicated: Secondary | ICD-10-CM

## 2023-09-30 MED ORDER — FLUTICASONE PROPIONATE 50 MCG/ACT NA SUSP: 1.0000 | Freq: Two times a day (BID) | NASAL | 3 refills | Status: DC

## 2023-09-30 NOTE — Progress Notes (Signed)
 @Patient  ID: Warren Weeks, male    DOB: [birth year], 78 y.o.   MRN: [id number]  Chief Complaint  Patient presents with   Follow-up    Pt states horseness could it be due to Cpap ?    Referring provider: Corwin Levins, MD  HPI:   78 y.o. man whom we are seeing for evaluation of pulmonary hypertension and OSA.  Most recent PCP note reviewed. Most recent Cardiology note reviewed.  Insert routine follow-up.  Doing well.  Minimal shortness of breath etc.  Using Advair 1 puff once a day.  Sitting in bed a sometimes difficulty with stairs overall doing fine.  No real cough.  Chief complaint is hoarseness.  Present for some time.  Seen by ENT in the past.  Had some edema on exam.  Sent to speech therapy.  He felt like those exercises did help for period of time.  We discussed possible etiologies of his hoarseness including ICS therapy, CPAP therapy and drying out, Reflux, sinus drainage with worsening atopic symptoms with the increase in pollen as of late.  HPI at initial visit: Patient presents today to clinic for discussion of pulmonary hypertension.  OSA on CPAP.  Some shortness of breath.  Echocardiogram was obtained 12/2022.  This demonstrated dilated left atrium, MVR, grade 1 diastolic dysfunction, signs of elevated PASP.  He was started on Lasix given lower extremity edema.  Right heart catheterization was arranged.  This was performed 01/2023 with a mean PA pressure of 25, wedge of 17, preserved cardiac output and index with a PVR of 1.2 Wood units.  He has been adherent to his Lasix.  Weight down about 9 pounds.  He feels like lower extremities are less swollen.  There is still some edema on exam.  Reviewed recent labs with mild increase in creatinine 1.2 from baseline 1-1.1.  He is fatigued throughout the day.  Tired.  Has musculoskeletal issues unable to exercise.  Compliance report for OSA on CPAP reviewed today, excellent adherence and well treated AHI 0.5.  6-minute walk today with 771 m  walked desaturate 97% to 95% but no need for oxygen, normal blood pressure response, normal heart rate response.  We discussed at length the role and rationale for vasodilators.  How it is unlikely to help him.  Discussed etiologies of pulmonary hypertension etc.   Questionaires / Pulmonary Flowsheets:   ACT:      No data to display          MMRC:     No data to display          Epworth:     10/17/2021    8:00 AM  Results of the Epworth flowsheet  Sitting and reading 0  Watching TV 3  Sitting, inactive in a public place (e.g. a theatre or a meeting) 0  As a passenger in a car for an hour without a break 2  Lying down to rest in the afternoon when circumstances permit 0  Sitting and talking to someone 0  Sitting quietly after a lunch without alcohol 3  In a car, while stopped for a few minutes in traffic 0  Total score 8    Tests:   FENO:  No results found for: "NITRICOXIDE"  PFT:    Latest Ref Rng & Units 11/25/2022    9:58 AM 12/16/2021    9:03 AM  PFT Results  FVC-Pre L 3.49  3.41   FVC-Predicted Pre % 84  82  FVC-Post L 3.46  3.47   FVC-Predicted Post % 84  83   Pre FEV1/FVC % % 89  86   Post FEV1/FCV % % 87  87   FEV1-Pre L 3.09  2.94   FEV1-Predicted Pre % 103  97   FEV1-Post L 2.99  3.00   DLCO uncorrected ml/min/mmHg 19.21  19.65   DLCO UNC% % 78  79   DLCO corrected ml/min/mmHg 19.21  20.64   DLCO COR %Predicted % 78  83   DLVA Predicted % 92  102   TLC L 5.69  5.79   TLC % Predicted % 83  84   RV % Predicted % 82  94   Personally interpreted as normal spirometry, no bronchodilator response, lung branch is in normal limits, DLCO mildly reduced  WALK:     03/24/2023    9:28 AM  SIX MIN WALK  Medications Asprin 81 mg, Voltaren Gel, Diclofenac Sodium 100mg  CR, Lisinoprin 20 mg, Metoprolol Tartrate 50 mg, Omeprazole 20 mg, Testosterone 20.25 mg/1.25 gm gel, Thiamine 250 mg  Supplimental Oxygen during Test? (L/min) No  Laps 21  Partial Lap  (in Meters) 3 meters  Baseline BP (sitting) 128/64  Baseline Heartrate 68  Baseline Dyspnea (Borg Scale) 0  Baseline Fatigue (Borg Scale) 0.5  Baseline SPO2 97 %  BP (sitting) 164/74  Heartrate 100  Dyspnea (Borg Scale) 1  Fatigue (Borg Scale) 0  SPO2 95 %  BP (sitting) 126/66  Heartrate 62  SPO2 98 %  Stopped or Paused before Six Minutes No  Interpretation Angina;Hip pain;Leg pain  Distance Completed 717 meters  Distance Completed 3 meters  Tech Comments: Patient walked at an average pace with no stops for sob, fatigue or pain.  Tolerated walk well.    Imaging: Personally reviewed and as per EMR discussion this note No results found.  Lab Results: Personally reviewed CBC    Component Value Date/Time   WBC 4.3 09/08/2023 1003   RBC 4.21 (L) 09/08/2023 1003   HGB 12.7 (L) 09/08/2023 1003   HCT 38.5 (L) 09/08/2023 1003   PLT 126.0 (L) 09/08/2023 1003   MCV 91.3 09/08/2023 1003   MCH 29.5 01/29/2023 0902   MCHC 33.0 09/08/2023 1003   RDW 14.5 09/08/2023 1003   LYMPHSABS 1.3 09/08/2023 1003   MONOABS 0.5 09/08/2023 1003   EOSABS 0.3 09/08/2023 1003   BASOSABS 0.0 09/08/2023 1003    BMET    Component Value Date/Time   NA 140 09/08/2023 1003   K 4.4 09/08/2023 1003   CL 107 09/08/2023 1003   CO2 27 09/08/2023 1003   GLUCOSE 100 (H) 09/08/2023 1003   BUN 27 (H) 09/08/2023 1003   CREATININE 1.21 09/08/2023 1003   CALCIUM 9.2 09/08/2023 1003   GFRNONAA >60 01/29/2023 0902   GFRAA >60 02/14/2020 1720    BNP    Component Value Date/Time   BNP 160.4 (H) 06/22/2022 1401    ProBNP    Component Value Date/Time   PROBNP 565 (H) 03/24/2023 1043   PROBNP 260.0 (H) 12/09/2022 1602    Specialty Problems       Pulmonary Problems   Allergic rhinitis   Qualifier: Diagnosis of  By: Jonny Ruiz MD, Len Blalock       DOE (dyspnea on exertion)   Qualifier: Diagnosis of  By: Jonny Ruiz MD, Len Blalock       Cough   Wheezing   Pleurisy   Chronic sinusitis   Acute sinus  infection  Loud snoring   Sleep apnea   Asthma    Allergies  Allergen Reactions   Cefuroxime Axetil Rash    Immunization History  Administered Date(s) Administered   Fluad Quad(high Dose 65+) 02/21/2019, 03/12/2021   Fluad Trivalent(High Dose 65+) 06/08/2023   H1N1 04/22/2008   Influenza Whole 06/10/2009   Influenza, High Dose Seasonal PF 03/20/2016, 03/16/2017, 03/16/2018   Influenza,inj,Quad PF,6+ Mos 04/10/2015   Influenza-Unspecified 03/22/2012, 09/03/2014, 04/22/2020, 04/06/2022   PFIZER(Purple Top)SARS-COV-2 Vaccination 07/24/2019, 08/21/2019, 06/24/2020   Pneumococcal Conjugate-13 12/12/2013   Pneumococcal Polysaccharide-23 12/14/2014   RSV,unspecified 04/06/2022   Td 10/03/2008   Tdap 12/07/2018   Zoster Recombinant(Shingrix) 09/24/2017, 12/20/2017   Zoster, Live 10/12/2012    Past Medical History:  Diagnosis Date   ALLERGIC RHINITIS 09/16/2007   ALLERGY 03/24/2007   Allergy    ANXIETY 03/28/2007   Arthritis    knees, back,shoulder   CAROTID ARTERY STENOSIS, BILATERAL 07/01/2009   yearly checks - no current problems   Cataract    bilateral,removed   CEPHALGIA 06/08/2009   CHEST PAIN 09/16/2007   no current problems   COLONIC POLYPS, HX OF 09/16/2007   COPD (chronic obstructive pulmonary disease) (HCC) 05/12/2017   mild per patient - no inhaler   Dizziness and giddiness 06/08/2009   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   DYSPNEA/SHORTNESS OF BREATH 09/16/2007   ERECTILE DYSFUNCTION 03/28/2007   ESOPHAGEAL STRICTURE 09/16/2007   ESOPHAGITIS 03/24/2007   GERD 03/24/2007   GLUCOSE INTOLERANCE 09/16/2007   HEMORRHOIDS, INTERNAL 03/24/2007   HYPERCHOLESTEROLEMIA 03/24/2007   HYPERLIPIDEMIA 09/16/2007   HYPERTENSION 03/28/2007   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/2011   Impaired glucose tolerance 10/04/2010   LOW BACK PAIN 03/28/2007   MAGNETIC RESONANCE IMAGING, BRAIN, ABNORMAL 06/26/2009   NECK PAIN, LEFT 06/20/2010   OBESITY 03/24/2007   OTITIS MEDIA, LEFT  06/10/2009   Palpitations 06/20/2010   no current problems   PVD 06/25/2009   RASH-NONVESICULAR 06/17/2009   Sleep apnea 12/25/2021   URI 08/16/2009    Tobacco History: Social History   Tobacco Use  Smoking Status Former   Current packs/day: 0.00   Average packs/day: 0.5 packs/day for 15.0 years (7.5 ttl pk-yrs)   Types: Cigarettes   Start date: 06/22/1976   Quit date: 06/23/1991   Years since quitting: 32.2  Smokeless Tobacco Never  Tobacco Comments   Quit 30 years ago   Counseling given: Not Answered Tobacco comments: Quit 30 years ago   Continue to not smoke  Outpatient Encounter Medications as of 09/30/2023  Medication Sig   acetaminophen (TYLENOL) 650 MG CR tablet Take 650 mg by mouth at bedtime.   aspirin 81 MG EC tablet Take 1 tablet (81 mg total) by mouth daily. Swallow whole.   Azelastine HCl 137 MCG/SPRAY SOLN SMARTSIG:1-2 Spray(s) Both Nares Twice Daily PRN   Cyanocobalamin (B-12 PO) Take 2,500 mcg by mouth daily.   diclofenac Sodium (VOLTAREN) 1 % GEL Apply 2 g topically daily.   Diclofenac Sodium CR 100 MG 24 hr tablet Take 1 tablet by mouth once daily   ezetimibe (ZETIA) 10 MG tablet Take 1 tablet by mouth once daily   fluticasone-salmeterol (ADVAIR) 250-50 MCG/ACT AEPB INHALE 1 DOSE BY MOUTH IN THE MORNING AND IN THE EVENING   furosemide (LASIX) 40 MG tablet Take 1 tablet (40 mg total) by mouth daily.   lisinopril (ZESTRIL) 20 MG tablet Take 1 tablet by mouth once daily   Magnesium 250 MG TABS Take 250 mg by mouth daily.   Melatonin 10 MG TABS Take  10 mg by mouth at bedtime.    methocarbamol (ROBAXIN) 500 MG tablet Take 1 tablet (500 mg total) by mouth 2 (two) times daily as needed.   metoprolol tartrate (LOPRESSOR) 50 MG tablet Take 1 tablet by mouth twice daily   Multiple Vitamin (ONE-A-DAY 55 PLUS PO) Take 1 tablet by mouth daily. Men   Omega-3 Fatty Acids (FISH OIL) 1000 MG CAPS Take 1,000 mg by mouth daily.   omeprazole (PRILOSEC) 20 MG capsule Take 1  capsule by mouth twice daily   potassium chloride SA (KLOR-CON M) 20 MEQ tablet Take 1 tablet (20 mEq total) by mouth daily.   rosuvastatin (CRESTOR) 40 MG tablet Take 1 tablet by mouth once daily   sildenafil (VIAGRA) 100 MG tablet Take 1 tablet (100 mg total) by mouth as needed for erectile dysfunction.   solifenacin (VESICARE) 5 MG tablet Take 1 tablet (5 mg total) by mouth daily.   terazosin (HYTRIN) 5 MG capsule Take 5 mg by mouth at bedtime.    Testosterone 20.25 MG/1.25GM (1.62%) GEL Apply 2 Packages topically daily. On each shoulder   thiamine 250 MG tablet Take 250 mg by mouth daily.   tirzepatide [facility]) 2.5 MG/0.5ML Pen Inject 2.5 mg into the skin once a week. E11.9   Turmeric 500 MG TABS Take 1,000 mg by mouth daily. ginger   [DISCONTINUED] fluticasone (FLONASE) 50 MCG/ACT nasal spray USE 2 SPRAY(S) IN EACH NOSTRIL ONCE DAILY   fluticasone (FLONASE) 50 MCG/ACT nasal spray Place 1 spray into both nostrils in the morning and at bedtime. USE 2 SPRAY(S) IN EACH NOSTRIL ONCE DAILY   No facility-administered encounter medications on file as of 09/30/2023.     Review of Systems  Review of Systems  N/a Physical Exam  BP 128/78 (BP Location: Left Arm, Patient Position: Sitting, Cuff Size: Normal)   Pulse 83   Ht 5\' 9"  (1.753 m)   Wt 245 lb (111.1 kg)   SpO2 97%   BMI 36.18 kg/m   Wt Readings from Last 5 Encounters:  09/30/23 245 lb (111.1 kg)  09/28/23 245 lb 3.2 oz (111.2 kg)  09/08/23 242 lb (109.8 kg)  08/09/23 245 lb (111.1 kg)  03/24/23 235 lb (106.6 kg)    BMI Readings from Last 5 Encounters:  09/30/23 36.18 kg/m  09/28/23 36.21 kg/m  09/08/23 35.74 kg/m  08/09/23 36.18 kg/m  03/24/23 34.70 kg/m     Physical Exam General: Sitting in chair, no acute distress Eyes: EOMI, no icterus Neck: Supple, JVP appreciated sitting upright Pulmonary: Clear, normal work of breathing Cardiovascular: Warm, 1+ edema to midshin bilaterally    Assessment & Plan:    Pulmonary hypertension: WHO group 2 disease with NYHA class II symptoms.  Elevated pulmonary capillary wedge with PVR about 1.2.  Previous echocardiogram with dilated left atrium, diastolic dysfunction, MVR.  Discussed at length do not think he would improve with pulm vasodilators given low PVR and high likelihood for decompensation and worsening symptoms development of pulmonary edema etc. with pulmonary vasodilators given left-sided structural disease.  Stressed importance of low-salt diet.    OSA on CPAP: Review of compliance report reveals excellent adherence, 100% greater than 8 hours every night.  AHI 0.7.  Excellent control.  He is having significant clinical benefit from ongoing CPAP use.  I encouraged him to continue ongoing CPAP use.  Asthma: Continue Advair.  Consider decreasing steroid dose versus change to HFA if hoarseness continues.  Allergic rhinitis: Add Flonase to azelastine with worsening nasal congestion  etc. with pollens.  New prescription today.   Return in about 6 months (around 03/31/2024) for f/u Dr. Judeth Horn.     Karren Burly, MD 09/30/2023  We did okay I think.  Over the

## 2023-09-30 NOTE — Patient Instructions (Signed)
 Add flonase 1 spray each nostril twice a day  No other changes to medicines  Return to clinic in 6 months or sooner as needed

## 2023-10-07 ENCOUNTER — Other Ambulatory Visit: Payer: Self-pay | Admitting: Internal Medicine

## 2023-10-11 ENCOUNTER — Other Ambulatory Visit: Payer: Self-pay

## 2023-10-21 ENCOUNTER — Ambulatory Visit (HOSPITAL_COMMUNITY)
Admission: RE | Admit: 2023-10-21 | Discharge: 2023-10-21 | Disposition: A | Discharge status: Home / Self Care | Source: Ambulatory Visit | Attending: Cardiology | Admitting: Cardiology

## 2023-10-21 DIAGNOSIS — I2510 Atherosclerotic heart disease of native coronary artery without angina pectoris: Secondary | ICD-10-CM | POA: Diagnosis not present

## 2023-10-21 DIAGNOSIS — I872 Venous insufficiency (chronic) (peripheral): Secondary | ICD-10-CM | POA: Diagnosis not present

## 2023-10-28 ENCOUNTER — Encounter: Payer: Self-pay | Admitting: Cardiology

## 2023-11-03 ENCOUNTER — Ambulatory Visit: Payer: Self-pay

## 2023-11-03 NOTE — Telephone Encounter (Signed)
 Copied from CRM [id number]. Topic: Clinical - Red Word Triage >> Nov 03, 2023  2:51 PM Odilia Bennett D wrote: Reason for BJY:NWGNF side pain for a week n half and it is getting worse  Chief Complaint: right flank pain Symptoms: pain Frequency: comes and goes Pertinent Negatives: Patient denies fever, blood in urine, pain with urination, cp, sob Disposition: [] ED /[] Urgent Care (no appt availability in office) / [x] Appointment(In office/virtual)/ []  [place] [facility]/ [] Home Care/ [] Refused Recommended Disposition /[] [place]/ []  Follow-up with PCP Additional Notes: per protocol apt scheduled for Friday am.  Care advice given, denies questions; instructed to go to ER if becomes worse.   Reason for Disposition  MODERATE pain (e.g., interferes with normal activities or awakens from sleep)  Answer Assessment - Initial Assessment Questions 1. LOCATION: "Where does it hurt?" (e.g., left, right)     Right side 2. ONSET: "When did the pain start?"     A week ago 3. SEVERITY: "How bad is the pain?" (e.g., Scale 1-10; mild, moderate, or severe)   - MILD (1-3): doesn't interfere with normal activities    - MODERATE (4-7): interferes with normal activities or awakens from sleep    - SEVERE (8-10): excruciating pain and patient unable to do normal activities (stays in bed)       sharp 4. PATTERN: "Does the pain come and go, or is it constant?"      Comes and goes 5. CAUSE: "What do you think is causing the pain?"     Kidney problem 6. OTHER SYMPTOMS:  "Do you have any other symptoms?" (e.g., fever, abdomen pain, vomiting, leg weakness, burning with urination, blood in urine)     denies 7. PREGNANCY:  "Is there any chance you are pregnant?" "When was your last menstrual period?"     na  Protocols used: Flank Pain-A-AH

## 2023-11-04 ENCOUNTER — Ambulatory Visit: Payer: Self-pay | Admitting: *Deleted

## 2023-11-05 ENCOUNTER — Encounter: Payer: Self-pay | Admitting: Family Medicine

## 2023-11-05 ENCOUNTER — Ambulatory Visit: Admitting: Family Medicine

## 2023-11-05 VITALS — BP 132/70 | HR 56 | Temp 98.2°F | Ht 69.0 in | Wt 241.2 lb

## 2023-11-05 DIAGNOSIS — E1165 Type 2 diabetes mellitus with hyperglycemia: Secondary | ICD-10-CM

## 2023-11-05 DIAGNOSIS — M542 Cervicalgia: Secondary | ICD-10-CM | POA: Diagnosis not present

## 2023-11-05 DIAGNOSIS — Z7984 Long term (current) use of oral hypoglycemic drugs: Secondary | ICD-10-CM | POA: Diagnosis not present

## 2023-11-05 DIAGNOSIS — E66812 Obesity, class 2: Secondary | ICD-10-CM | POA: Diagnosis not present

## 2023-11-05 DIAGNOSIS — Z6835 Body mass index (BMI) 35.0-35.9, adult: Secondary | ICD-10-CM

## 2023-11-05 DIAGNOSIS — N1831 Chronic kidney disease, stage 3a: Secondary | ICD-10-CM

## 2023-11-05 DIAGNOSIS — R109 Unspecified abdominal pain: Secondary | ICD-10-CM | POA: Diagnosis not present

## 2023-11-05 DIAGNOSIS — M159 Polyosteoarthritis, unspecified: Secondary | ICD-10-CM

## 2023-11-05 LAB — COMPREHENSIVE METABOLIC PANEL WITH GFR
ALT: 28 U/L (ref 0–53)
AST: 33 U/L (ref 0–37)
Albumin: 4.4 g/dL (ref 3.5–5.2)
Alkaline Phosphatase: 77 U/L (ref 39–117)
BUN: 19 mg/dL (ref 6–23)
CO2: 29 meq/L (ref 19–32)
Calcium: 9.7 mg/dL (ref 8.4–10.5)
Chloride: 103 meq/L (ref 96–112)
Creatinine, Ser: 1.12 mg/dL (ref 0.40–1.50)
GFR: 64.3 mL/min (ref >60.00)
Glucose, Bld: 103 mg/dL — ABNORMAL HIGH (ref 70–99)
Potassium: 4 meq/L (ref 3.5–5.1)
Sodium: 139 meq/L (ref 135–145)
Total Bilirubin: 0.6 mg/dL (ref 0.2–1.2)
Total Protein: 8.3 g/dL (ref 6.0–8.3)

## 2023-11-05 LAB — POCT URINALYSIS DIP (CLINITEK)
Bilirubin, UA: NEGATIVE
Blood, UA: NEGATIVE
Glucose, UA: NEGATIVE mg/dL
Ketones, POC UA: NEGATIVE mg/dL
Leukocytes, UA: NEGATIVE
Nitrite, UA: NEGATIVE
POC PROTEIN,UA: NEGATIVE
Spec Grav, UA: 1.015 (ref 1.010–1.025)
Urobilinogen, UA: 0.2 U/dL
pH, UA: 5.5 (ref 5.0–8.0)

## 2023-11-05 LAB — CBC WITH DIFFERENTIAL/PLATELET
Basophils Absolute: 0 10*3/uL (ref 0.0–0.1)
Basophils Relative: 0.6 % (ref 0.0–3.0)
Eosinophils Absolute: 0.3 10*3/uL (ref 0.0–0.7)
Eosinophils Relative: 7.1 % — ABNORMAL HIGH (ref 0.0–5.0)
HCT: 39.6 % (ref 39.0–52.0)
Hemoglobin: 13.2 g/dL (ref 13.0–17.0)
Lymphocytes Relative: 26.8 % (ref 12.0–46.0)
Lymphs Abs: 1.1 10*3/uL (ref 0.7–4.0)
MCHC: 33.2 g/dL (ref 30.0–36.0)
MCV: 90.1 fl (ref 78.0–100.0)
Monocytes Absolute: 0.4 10*3/uL (ref 0.1–1.0)
Monocytes Relative: 9.2 % (ref 3.0–12.0)
Neutro Abs: 2.3 10*3/uL (ref 1.4–7.7)
Neutrophils Relative %: 56.3 % (ref 43.0–77.0)
Platelets: 132 10*3/uL — ABNORMAL LOW (ref 150.0–400.0)
RBC: 4.4 Mil/uL (ref 4.22–5.81)
RDW: 14.5 % (ref 11.5–15.5)
WBC: 4.2 10*3/uL (ref 4.0–10.5)

## 2023-11-05 MED ORDER — TIRZEPATIDE 2.5 MG/0.5ML ~~LOC~~ SOAJ: 2.5000 mg | SUBCUTANEOUS | 11 refills | Status: DC

## 2023-11-05 NOTE — Patient Instructions (Addendum)
 I will be sending referrals over to Dr. Domingo Friend and Dr. Sharion Davidson with [facility] and [facility] for you today.  Urine looks okay today, will culture for confirmation. I do not suspect infection, you may have a kidney stone hiding in there.  I would recommend that you continue to drink plenty of fluids and take a muscle relaxer as needed.  I will go ahead and send in your Mounjaro 2.5 mg to your pharmacy.  Once you get this medication, you can make a nurse visit with us  and we will show you how to use your medication and give you the first dose

## 2023-11-05 NOTE — Progress Notes (Signed)
 Acute Office Visit  Subjective:     Patient ID: Warren Weeks, male    DOB: 02-13-1946, 78 y.o.   MRN: [id number]  Chief Complaint  Patient presents with   Acute Visit    Flank pain for about 2 weeks    HPI Patient is in today for evaluation of bilateral flank pain for about the last 2 weeks.  Reports that he wants to make sure that this is not involving his kidneys. Has tried 100 mg ibuprofen, muscle relaxer, heat to the area with benefit the next morning. Has chronic neck and back pain. Denies known injury, swelling, redness, bruising, tenderness to the area, previous symptoms.  Requesting referral back to neurosurgery for increased neck pain.  Has previously been a patient of [facility]. Also requesting referral back to Dr. Christiane Cowing with [facility] for arthritis pain.  Has previously been a patient here as well.  Inquiring about Mounjaro today.  States that he finally got insurance approval, but is not sure how to give injections.  Has not had prescription filled yet.  Denies other concerns today.   ROS Per HPI      Objective:    BP 132/70 (BP Location: Left Arm, Patient Position: Sitting)   Pulse (!) 56   Temp 98.2 F (36.8 C) (Temporal)   Ht 5\' 9"  (1.753 m)   Wt 241 lb 3.2 oz (109.4 kg)   SpO2 95%   BMI 35.62 kg/m    Physical Exam Vitals and nursing note reviewed.  Constitutional:      General: He is not in acute distress.    Appearance: Normal appearance. He is obese.  HENT:     Head: Normocephalic and atraumatic.     Right Ear: External ear normal.     Left Ear: External ear normal.     Nose: Nose normal.  Eyes:     Extraocular Movements: Extraocular movements intact.  Cardiovascular:     Rate and Rhythm: Normal rate and regular rhythm.     Pulses: Normal pulses.     Heart sounds: Normal heart sounds.  Pulmonary:     Effort: Pulmonary effort is normal. No respiratory distress.     Breath sounds: Normal breath sounds. No wheezing, rhonchi  or rales.  Musculoskeletal:     Cervical back: Normal range of motion.     Right lower leg: No edema.     Left lower leg: No edema.     Comments: Generalized spinal limited range of motion with neck, T-spine, low back. Using single-point cane with ambulation No specific areas of tenderness, bruising, erythema, heat, spasm.  Lymphadenopathy:     Cervical: No cervical adenopathy.  Skin:    General: Skin is warm and dry.  Neurological:     General: No focal deficit present.     Mental Status: He is alert and oriented to person, place, and time.  Psychiatric:        Mood and Affect: Mood normal.        Behavior: Behavior normal.     Results for orders placed or performed in visit on 11/05/23  Comprehensive metabolic panel with GFR  Result Value Ref Range   Sodium 139 135 - 145 mEq/L   Potassium 4.0 3.5 - 5.1 mEq/L   Chloride 103 96 - 112 mEq/L   CO2 29 19 - 32 mEq/L   Glucose, Bld 103 (H) 70 - 99 mg/dL   BUN 19 6 - 23 mg/dL   Creatinine, Ser  1.12 0.40 - 1.50 mg/dL   Total Bilirubin 0.6 0.2 - 1.2 mg/dL   Alkaline Phosphatase 77 39 - 117 U/L   AST 33 0 - 37 U/L   ALT 28 0 - 53 U/L   Total Protein 8.3 6.0 - 8.3 g/dL   Albumin 4.4 3.5 - 5.2 g/dL   GFR 81.19 >14.78 mL/min   Calcium  9.7 8.4 - 10.5 mg/dL  CBC with Differential/Platelet  Result Value Ref Range   WBC 4.2 4.0 - 10.5 K/uL   RBC 4.40 4.22 - 5.81 Mil/uL   Hemoglobin 13.2 13.0 - 17.0 g/dL   HCT 29.5 62.1 - 30.8 %   MCV 90.1 78.0 - 100.0 fl   MCHC 33.2 30.0 - 36.0 g/dL   RDW 65.7 84.6 - 96.2 %   Platelets 132.0 (L) 150.0 - 400.0 K/uL   Neutrophils Relative % 56.3 43.0 - 77.0 %   Lymphocytes Relative 26.8 12.0 - 46.0 %   Monocytes Relative 9.2 3.0 - 12.0 %   Eosinophils Relative 7.1 (H) 0.0 - 5.0 %   Basophils Relative 0.6 0.0 - 3.0 %   Neutro Abs 2.3 1.4 - 7.7 K/uL   Lymphs Abs 1.1 0.7 - 4.0 K/uL   Monocytes Absolute 0.4 0.1 - 1.0 K/uL   Eosinophils Absolute 0.3 0.0 - 0.7 K/uL   Basophils Absolute 0.0 0.0 - 0.1  K/uL  POCT URINALYSIS DIP (CLINITEK)  Result Value Ref Range   Color, UA yellow yellow   Clarity, UA clear clear   Glucose, UA negative negative mg/dL   Bilirubin, UA negative negative   Ketones, POC UA negative negative mg/dL   Spec Grav, UA 9.528 4.132 - 1.025   Blood, UA negative negative   pH, UA 5.5 5.0 - 8.0   POC PROTEIN,UA negative negative, trace   Urobilinogen, UA 0.2 0.2 or 1.0 E.U./dL   Nitrite, UA Negative Negative   Leukocytes, UA Negative Negative        Assessment & Plan:   Flank pain -     POCT URINALYSIS DIP (CLINITEK)  Type 2 diabetes mellitus with hyperglycemia, without long-term current use of insulin (HCC) -     Tirzepatide; Inject 2.5 mg into the skin once a week. E11.9  Dispense: 2 mL; Refill: 11 -     Comprehensive metabolic panel with GFR -     CBC with Differential/Platelet  Stage 3a chronic kidney disease (HCC) -     Tirzepatide; Inject 2.5 mg into the skin once a week. E11.9  Dispense: 2 mL; Refill: 11 -     CBC with Differential/Platelet  Generalized OA -     Ambulatory referral to Orthopedics  Neck pain -     Ambulatory referral to Neurosurgery  Class 2 severe obesity due to excess calories with serious comorbidity and body mass index (BMI) of 35.0 to 35.9 in adult [facility]) -     Tirzepatide; Inject 2.5 mg into the skin once a week. E11.9  Dispense: 2 mL; Refill: 11     Meds ordered this encounter  Medications   tirzepatide (MOUNJARO) 2.5 MG/0.5ML Pen    Sig: Inject 2.5 mg into the skin once a week. E11.9    Dispense:  2 mL    Refill:  11    Return for As needed nurse visit for Mounjaro injection.  Wellington Half, FNP

## 2023-11-06 ENCOUNTER — Encounter: Payer: Self-pay | Admitting: Family Medicine

## 2023-11-06 ENCOUNTER — Ambulatory Visit: Payer: Self-pay | Admitting: Family Medicine

## 2023-11-10 ENCOUNTER — Ambulatory Visit (INDEPENDENT_AMBULATORY_CARE_PROVIDER_SITE_OTHER)

## 2023-11-10 DIAGNOSIS — E119 Type 2 diabetes mellitus without complications: Secondary | ICD-10-CM

## 2023-11-10 DIAGNOSIS — Z7985 Long-term (current) use of injectable non-insulin antidiabetic drugs: Secondary | ICD-10-CM | POA: Diagnosis not present

## 2023-11-10 NOTE — Progress Notes (Signed)
 Pt presented for education on administering injectable medication. After thorough teaching session using a prototype, the patient was able to demonstrate proper injection technique accurately and safely. Patient verbalized understanding and was encouraged to ask questions. Return demonstration [facility]) 2.5 MG was successful. Pt advised, since injection was done today (Wednesday), he will need to administer next dose next week Wednesday

## 2023-11-26 ENCOUNTER — Telehealth: Payer: Self-pay

## 2023-11-26 MED ORDER — TIRZEPATIDE 5 MG/0.5ML ~~LOC~~ SOAJ: 5.0000 mg | SUBCUTANEOUS | 3 refills | Status: DC

## 2023-11-26 NOTE — Telephone Encounter (Signed)
 Copied from CRM [id number]. Topic: Clinical - Medication Question >> Nov 26, 2023  4:18 PM [place] E wrote: Reason for CRM: Patient called in regarding his tirzepatide (MOUNJARO) 2.5 MG/0.5ML Pen. Stated he takes his last dose of the 2.5 MG next week and questioning if he can increase dosages. Mobile number on file is good for a callback.

## 2023-11-26 NOTE — Telephone Encounter (Signed)
 Ok this is done

## 2023-11-29 NOTE — Telephone Encounter (Signed)
 Called and left voice mail

## 2023-12-10 IMAGING — CT CT CARDIAC CORONARY ARTERY CALCIUM SCORE
3 series · 14 of 20 positions shown, 16 images · non-contrast
Comparison: 02/14/2020 chest radiograph.
COMPARISON: 02/14/2020 chest radiograph.

Addendum:
EXAM:
OVER-READ INTERPRETATION  CT CHEST

The following report is an over-read performed by radiologist Dr.
Lusine Jim [REDACTED] on 10/07/2021. This over-read
does not include interpretation of cardiac or coronary anatomy or
pathology. The calcium score interpretation by the cardiologist is
attached.
TECHNIQUE: A gated, non-contrast computed tomography scan of the heart was
performed using 3mm slice thickness. Axial images were analyzed on a
dedicated workstation. Calcium scoring of the coronary arteries was
performed using the Agatston method.

[Series 2: cascseq 2.0 sa36 70% (id) · axial · 0.48mm/px · z∈[-76,+14]mm · 4 of 76 slices shown]
[im 16/76  vessel]
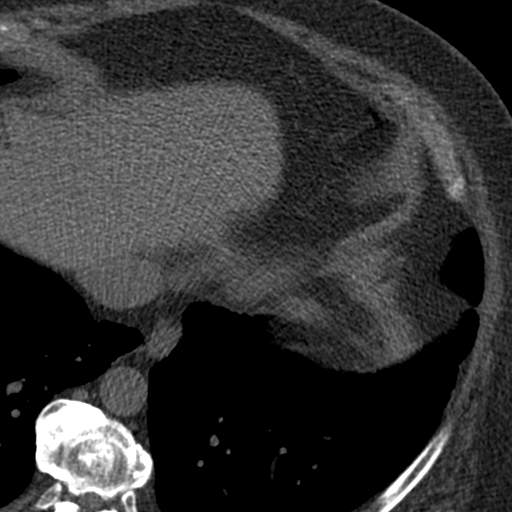
[im 31/76  vessel]
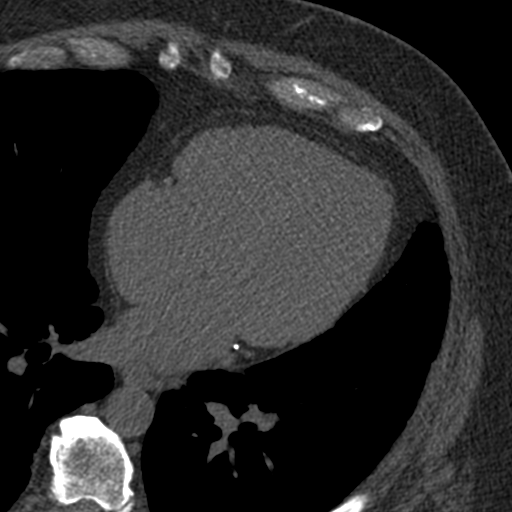
[im 46/76  vessel]
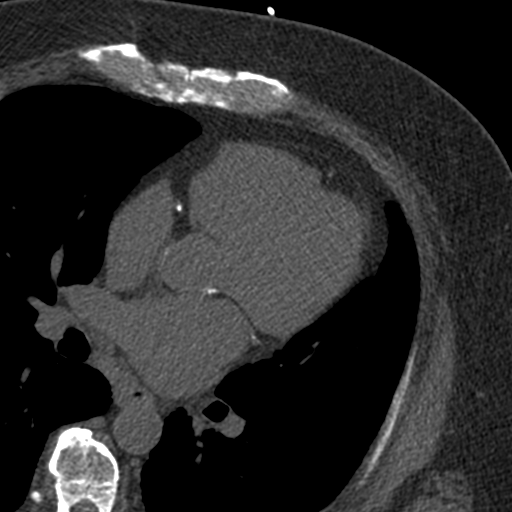
[im 61/76  vessel]
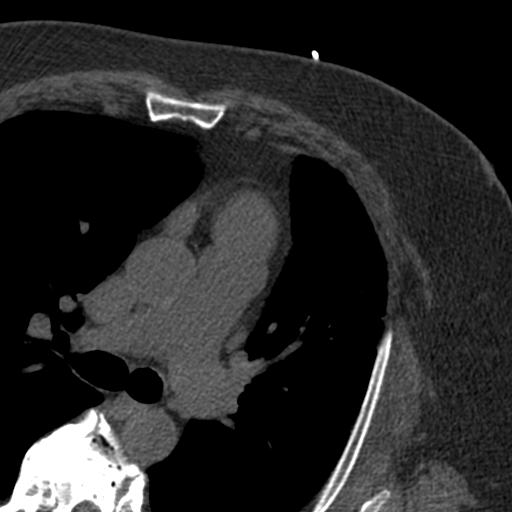

[Series 3: cascseq 2.0 bf37 st · axial · 0.85mm/px · z∈[-82,+18]mm · 5 of 76 slices shown, 7 images]
[im 13/76  vessel]
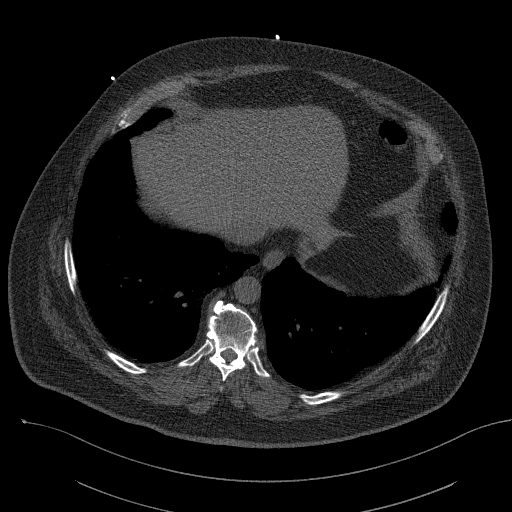
[im 13/76  lung]
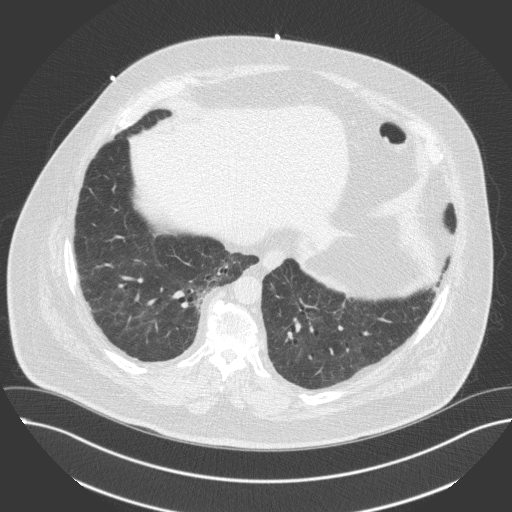
[im 26/76  vessel]
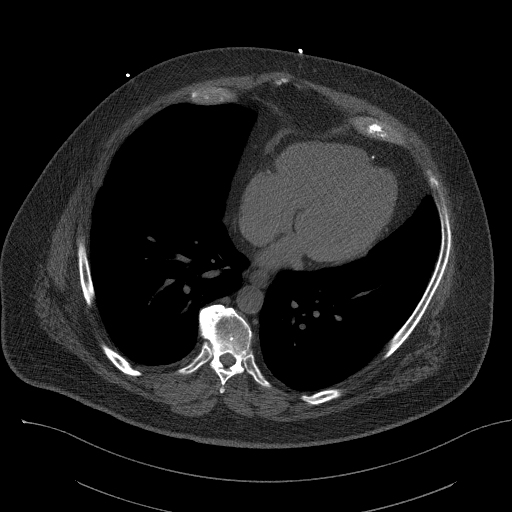
[im 38/76  vessel]
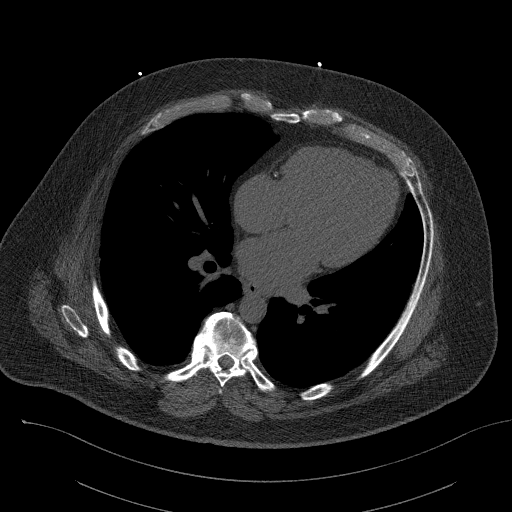
[im 51/76  vessel]
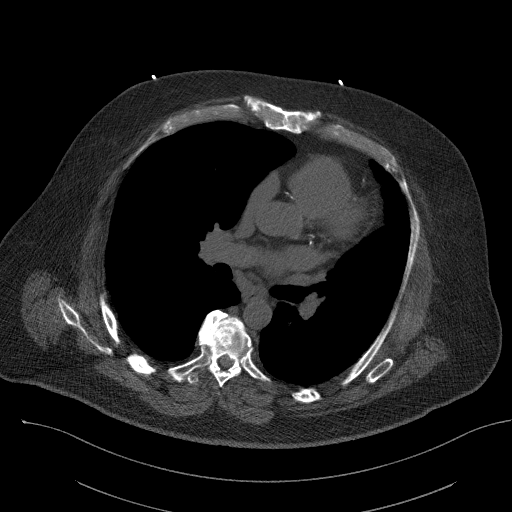
[im 63/76  vessel]
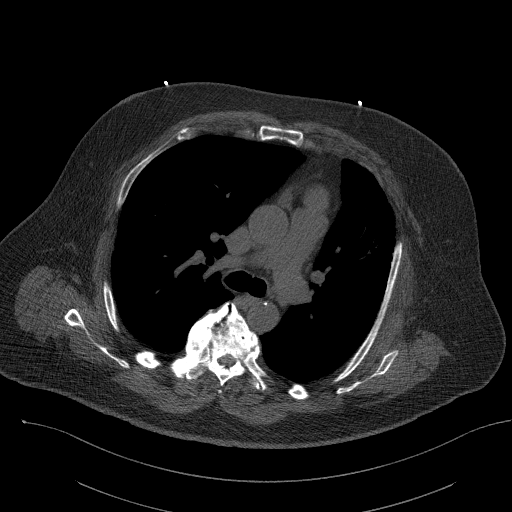
[im 63/76  lung]
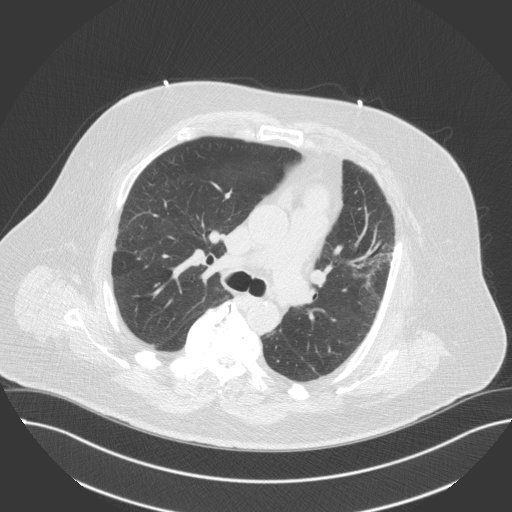

[Series 4: cascseq 2.0 br59 lung · axial · 0.85mm/px · z∈[-82,+18]mm · 5 of 76 slices shown]
[im 13/76  lung]
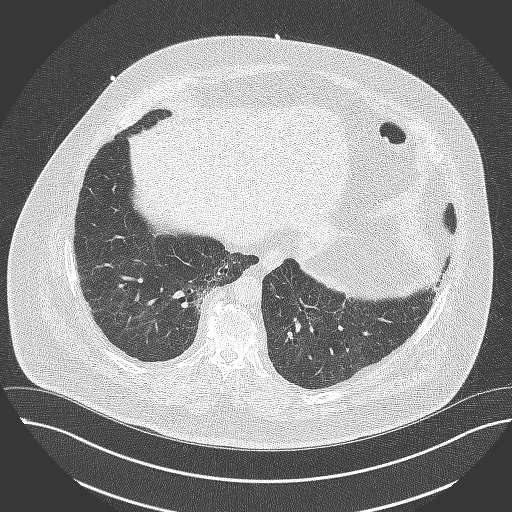
[im 26/76  lung]
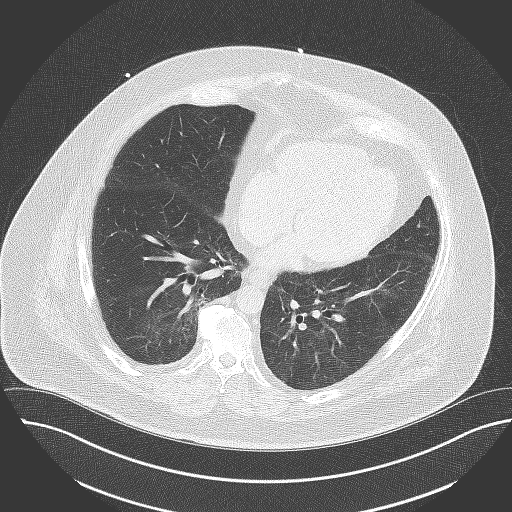
[im 38/76  lung]
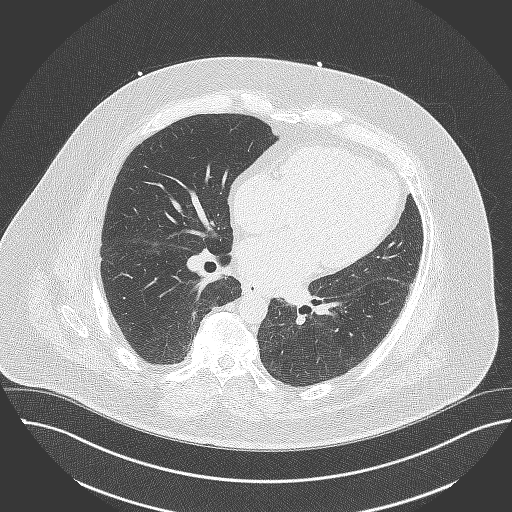
[im 51/76  lung]
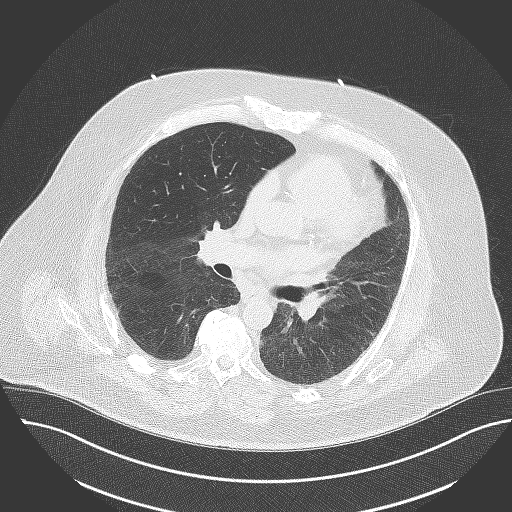
[im 63/76  lung]
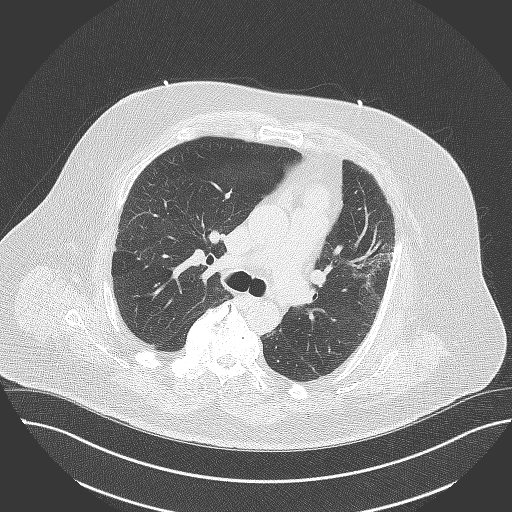

[14 of 20 positions shown; findings below may reference images not displayed]

FINDINGS: Vascular: Aortic atherosclerosis.

Mediastinum/Nodes: No imaged thoracic adenopathy.

Lungs/Pleura: No pleural fluid.  Mild centrilobular emphysema.

Anterior left upper lobe architectural distortion and interstitial
thickening are likely due to post infectious/inflammatory scarring.

Upper Abdomen: Normal imaged portions of the liver, spleen, stomach.

Musculoskeletal: Advanced thoracic spondylosis.
IMPRESSION: No acute process in the imaged extracardiac chest.

Aortic Atherosclerosis (MGCU0-17U.U) and Emphysema (MGCU0-NPP.2).

ADDENDUM:
Cardiovascular Disease Risk stratification

EXAM:
Coronary Calcium Score
FINDINGS: Coronary arteries: Normal origins.

Coronary Calcium Score:

Left main:

Left anterior descending artery: 691

Left circumflex artery: 136

Right coronary artery: 443

Total: 2572

Percentile: 85

Aortic valve calcification: Agatston score 291

Pericardium: Normal.

Ascending Aorta: Normal caliber.

Non-cardiac: See separate report from [REDACTED].
IMPRESSION: Coronary calcium score of 2572. This was 85 percentile for age-,
race-, and sex-matched controls.

RECOMMENDATIONS:
Coronary artery calcium (CAC) score is a strong predictor of
incident coronary heart disease (CHD) and provides predictive
information beyond traditional risk factors. CAC scoring is
reasonable to use in the decision to withhold, postpone, or initiate
statin therapy in intermediate-risk or selected borderline-risk
asymptomatic adults (age 40-75 years and LDL-C >=70 to <190 mg/dL)
who do not have diabetes or established atherosclerotic
cardiovascular disease (ASCVD).* In intermediate-risk (10-year ASCVD
risk >=7.5% to <20%) adults or selected borderline-risk (10-year
ASCVD risk >=5% to <7.5%) adults in whom a CAC score is measured for
the purpose of making a treatment decision the following
recommendations have been made:

If CAC=0, it is reasonable to withhold statin therapy and reassess
in 5 to 10 years, as long as higher risk conditions are absent
(diabetes mellitus, family history of premature CHD in first degree
relatives (males <55 years; females <65 years), cigarette smoking,
or LDL >=190 mg/dL).

If CAC is 1 to 99, it is reasonable to initiate statin therapy for
patients >=55 years of age.

If CAC is >=100 or >=75th percentile, it is reasonable to initiate
statin therapy at any age.

Cardiology referral should be considered for patients with CAC
scores >=400 or >=75th percentile.

*4843 AHA/ACC/AACVPR/AAPA/ABC/BROUGHTON/GITA SAKHE/WRASSE/Amie/SINDY JOHANA/BERGER/AMAZIGH
Guideline on the Management of Blood Cholesterol: A Report of the
American College of Cardiology/American Heart Association Task Force
on Clinical Practice Guidelines. J Am Coll Cardiol.
2299;73(24):8064-8602.

Md Absar Idicula, DO

The noncardiac portion of this study will be interpreted in separate
report by the radiologist.

*** End of Addendum ***
EXAM:
OVER-READ INTERPRETATION  CT CHEST

The following report is an over-read performed by radiologist Dr.
Lusine Jim [REDACTED] on 10/07/2021. This over-read
does not include interpretation of cardiac or coronary anatomy or
pathology. The calcium score interpretation by the cardiologist is
attached.
FINDINGS: Vascular: Aortic atherosclerosis.

Mediastinum/Nodes: No imaged thoracic adenopathy.

Lungs/Pleura: No pleural fluid.  Mild centrilobular emphysema.

Anterior left upper lobe architectural distortion and interstitial
thickening are likely due to post infectious/inflammatory scarring.

Upper Abdomen: Normal imaged portions of the liver, spleen, stomach.

Musculoskeletal: Advanced thoracic spondylosis.
IMPRESSION: No acute process in the imaged extracardiac chest.

Aortic Atherosclerosis (MGCU0-17U.U) and Emphysema (MGCU0-NPP.2).

## 2023-12-24 ENCOUNTER — Emergency Department (HOSPITAL_COMMUNITY)
Admission: EM | Admit: 2023-12-24 | Discharge: 2023-12-25 | Disposition: A | Discharge status: Home / Self Care | Attending: Emergency Medicine | Admitting: Emergency Medicine

## 2023-12-24 ENCOUNTER — Ambulatory Visit (HOSPITAL_COMMUNITY)
Admission: EM | Admit: 2023-12-24 | Discharge: 2023-12-24 | Disposition: A | Discharge status: Home / Self Care | Attending: Emergency Medicine | Admitting: Emergency Medicine

## 2023-12-24 ENCOUNTER — Encounter (HOSPITAL_COMMUNITY): Payer: Self-pay

## 2023-12-24 ENCOUNTER — Emergency Department (HOSPITAL_COMMUNITY)

## 2023-12-24 ENCOUNTER — Other Ambulatory Visit: Payer: Self-pay

## 2023-12-24 DIAGNOSIS — N10 Acute tubulo-interstitial nephritis: Secondary | ICD-10-CM

## 2023-12-24 DIAGNOSIS — Z79899 Other long term (current) drug therapy: Secondary | ICD-10-CM | POA: Insufficient documentation

## 2023-12-24 DIAGNOSIS — R109 Unspecified abdominal pain: Secondary | ICD-10-CM | POA: Diagnosis not present

## 2023-12-24 DIAGNOSIS — I959 Hypotension, unspecified: Secondary | ICD-10-CM

## 2023-12-24 DIAGNOSIS — J449 Chronic obstructive pulmonary disease, unspecified: Secondary | ICD-10-CM | POA: Diagnosis not present

## 2023-12-24 DIAGNOSIS — I2510 Atherosclerotic heart disease of native coronary artery without angina pectoris: Secondary | ICD-10-CM | POA: Insufficient documentation

## 2023-12-24 DIAGNOSIS — K5730 Diverticulosis of large intestine without perforation or abscess without bleeding: Secondary | ICD-10-CM | POA: Diagnosis not present

## 2023-12-24 DIAGNOSIS — R350 Frequency of micturition: Secondary | ICD-10-CM | POA: Diagnosis present

## 2023-12-24 DIAGNOSIS — Z87891 Personal history of nicotine dependence: Secondary | ICD-10-CM | POA: Insufficient documentation

## 2023-12-24 DIAGNOSIS — R000 Tachycardia, unspecified: Secondary | ICD-10-CM | POA: Diagnosis not present

## 2023-12-24 DIAGNOSIS — Z7982 Long term (current) use of aspirin: Secondary | ICD-10-CM | POA: Diagnosis not present

## 2023-12-24 DIAGNOSIS — I10 Essential (primary) hypertension: Secondary | ICD-10-CM | POA: Diagnosis not present

## 2023-12-24 DIAGNOSIS — N3001 Acute cystitis with hematuria: Secondary | ICD-10-CM | POA: Insufficient documentation

## 2023-12-24 DIAGNOSIS — Z794 Long term (current) use of insulin: Secondary | ICD-10-CM | POA: Insufficient documentation

## 2023-12-24 LAB — CBC WITH DIFFERENTIAL/PLATELET
Abs Immature Granulocytes: 0.02 K/uL (ref 0.00–0.07)
Basophils Absolute: 0 K/uL (ref 0.0–0.1)
Basophils Relative: 1 %
Eosinophils Absolute: 0.2 K/uL (ref 0.0–0.5)
Eosinophils Relative: 2 %
HCT: 39.7 % (ref 39.0–52.0)
Hemoglobin: 13.6 g/dL (ref 13.0–17.0)
Immature Granulocytes: 0 %
Lymphocytes Relative: 13 %
Lymphs Abs: 1.1 K/uL (ref 0.7–4.0)
MCH: 30.8 pg (ref 26.0–34.0)
MCHC: 34.3 g/dL (ref 30.0–36.0)
MCV: 89.8 fL (ref 80.0–100.0)
Monocytes Absolute: 0.7 K/uL (ref 0.1–1.0)
Monocytes Relative: 8 %
Neutro Abs: 6.2 K/uL (ref 1.7–7.7)
Neutrophils Relative %: 76 %
Platelets: 142 K/uL — ABNORMAL LOW (ref 150–400)
RBC: 4.42 MIL/uL (ref 4.22–5.81)
RDW: 13.3 % (ref 11.5–15.5)
WBC: 8.2 K/uL (ref 4.0–10.5)
nRBC: 0 % (ref 0.0–0.2)

## 2023-12-24 LAB — URINALYSIS, ROUTINE W REFLEX MICROSCOPIC
Bilirubin Urine: NEGATIVE
Glucose, UA: NEGATIVE mg/dL
Ketones, ur: 5 mg/dL — AB
Nitrite: POSITIVE — AB
Protein, ur: 100 mg/dL — AB
RBC / HPF: >50 RBC/hpf (ref 0–5)
Specific Gravity, Urine: 1.017 (ref 1.005–1.030)
WBC, UA: >50 WBC/hpf (ref 0–5)
pH: 5 (ref 5.0–8.0)

## 2023-12-24 LAB — COMPREHENSIVE METABOLIC PANEL WITH GFR
ALT: 33 U/L (ref 0–44)
AST: 35 U/L (ref 15–41)
Albumin: 3.8 g/dL (ref 3.5–5.0)
Alkaline Phosphatase: 61 U/L (ref 38–126)
Anion gap: 12 (ref 5–15)
BUN: 16 mg/dL (ref 8–23)
CO2: 22 mmol/L (ref 22–32)
Calcium: 9.2 mg/dL (ref 8.9–10.3)
Chloride: 103 mmol/L (ref 98–111)
Creatinine, Ser: 1.27 mg/dL — ABNORMAL HIGH (ref 0.61–1.24)
GFR, Estimated: 59 mL/min — ABNORMAL LOW (ref >60)
Glucose, Bld: 122 mg/dL — ABNORMAL HIGH (ref 70–99)
Potassium: 3.8 mmol/L (ref 3.5–5.1)
Sodium: 137 mmol/L (ref 135–145)
Total Bilirubin: 0.8 mg/dL (ref 0.0–1.2)
Total Protein: 7.8 g/dL (ref 6.5–8.1)

## 2023-12-24 LAB — POCT URINALYSIS DIP (MANUAL ENTRY)
Bilirubin, UA: NEGATIVE
Glucose, UA: NEGATIVE mg/dL
Nitrite, UA: POSITIVE — AB
Protein Ur, POC: 30 mg/dL — AB
Spec Grav, UA: 1.015 (ref 1.010–1.025)
Urobilinogen, UA: 0.2 U/dL
pH, UA: 6.5 (ref 5.0–8.0)

## 2023-12-24 NOTE — ED Provider Notes (Incomplete)
 [place] EMERGENCY DEPARTMENT AT [facility] Provider Note   CSN: [id number] Arrival date & time: 12/24/23  1944     Patient presents with: Polyuria / Dysuria   Warren Weeks is a 78 y.o. male.  Patient with past medical history significant for hypertension, coronary artery disease, pulmonary hypertension, COPD presents the emergency room complaining of 1 day of chills, dysuria, urinary frequency.  He was seen in urgent care was concerned about right-sided flank pain with potential for pyelonephritis versus nephrolithiasis.  Urine at that time appeared grossly infected.  He denies nausea, vomiting, abdominal pain, chest pain, shortness of breath, fevers.  {Add pertinent medical, surgical, social history, OB history to HPI:32947} HPI     Prior to Admission medications   Medication Sig Start Date End Date Taking? Authorizing Provider  acetaminophen  (TYLENOL ) 650 MG CR tablet Take 650 mg by mouth at bedtime.    [provider]  aspirin  81 MG EC tablet Take 1 tablet (81 mg total) by mouth daily. Swallow whole. 10/07/21   Norleen Lynwood ORN, MD  Azelastine  HCl 137 MCG/SPRAY SOLN SMARTSIG:1-2 Spray(s) Both Nares Twice Daily PRN 07/18/23   [provider]  Cyanocobalamin  (B-12 PO) Take 2,500 mcg by mouth daily.    [provider]  diclofenac  Sodium (VOLTAREN ) 1 % GEL Apply 2 g topically daily.    [provider]  Diclofenac  Sodium CR 100 MG 24 hr tablet Take 1 tablet by mouth once daily 08/25/22   Norleen Lynwood ORN, MD  ezetimibe  (ZETIA ) 10 MG tablet Take 1 tablet by mouth once daily 04/20/23   Norleen Lynwood ORN, MD  fluticasone  (FLONASE ) 50 MCG/ACT nasal spray Place 1 spray into both nostrils in the morning and at bedtime. USE 2 SPRAY(S) IN EACH NOSTRIL ONCE DAILY 09/30/23   Hunsucker, Donnice SAUNDERS, MD  fluticasone -salmeterol (ADVAIR) 250-50 MCG/ACT AEPB INHALE 1 DOSE BY MOUTH IN THE MORNING AND IN THE EVENING 03/31/23   Hunsucker, Donnice SAUNDERS, MD  furosemide  (LASIX ) 40  MG tablet Take 1 tablet (40 mg total) by mouth daily. 02/04/23   Rolan Ezra RAMAN, MD  lisinopril  (ZESTRIL ) 20 MG tablet Take 1 tablet by mouth once daily 06/28/23   Norleen Lynwood ORN, MD  Magnesium 250 MG TABS Take 250 mg by mouth daily.    [provider]  Melatonin 10 MG TABS Take 10 mg by mouth at bedtime.     [provider]  methocarbamol  (ROBAXIN ) 500 MG tablet Take 1 tablet (500 mg total) by mouth 2 (two) times daily as needed. 09/08/23   Norleen Lynwood ORN, MD  metoprolol  tartrate (LOPRESSOR ) 50 MG tablet Take 1 tablet by mouth twice daily 03/15/23   Norleen Lynwood ORN, MD  Multiple Vitamin (ONE-A-DAY 55 PLUS PO) Take 1 tablet by mouth daily. Men    [provider]  Omega-3 Fatty Acids (FISH OIL) 1000 MG CAPS Take 1,000 mg by mouth daily.    [provider]  omeprazole  (PRILOSEC) 20 MG capsule Take 1 capsule by mouth twice daily 02/24/23   Norleen Lynwood ORN, MD  potassium chloride  SA (KLOR-CON  M) 20 MEQ tablet Take 1 tablet (20 mEq total) by mouth daily. 02/04/23   Rolan Ezra RAMAN, MD  rosuvastatin  (CRESTOR ) 40 MG tablet Take 1 tablet by mouth once daily 10/11/23   Norleen Lynwood ORN, MD  sildenafil  (VIAGRA ) 100 MG tablet Take 1 tablet (100 mg total) by mouth as needed for erectile dysfunction. 12/20/12   Norleen Lynwood ORN, MD  solifenacin  (  VESICARE ) 5 MG tablet Take 1 tablet (5 mg total) by mouth daily. 07/05/20   Norleen Lynwood ORN, MD  terazosin  (HYTRIN ) 5 MG capsule Take 5 mg by mouth at bedtime.  03/22/15   [provider]  Testosterone  20.25 MG/1.25GM (1.62%) GEL Apply 2 Packages topically daily. On each shoulder 08/05/20   [provider]  thiamine  250 MG tablet Take 250 mg by mouth daily.    [provider]  tirzepatide  (MOUNJARO ) 5 MG/0.5ML Pen Inject 5 mg into the skin once a week. 11/26/23   Norleen Lynwood ORN, MD  Turmeric 500 MG TABS Take 1,000 mg by mouth daily. ginger    [provider]    Allergies: Cefuroxime axetil    Review of Systems  Updated  Vital Signs BP 113/69 (BP Location: Right Arm)   Pulse (!) 120   Temp 98.3 F (36.8 C)   Resp 20   SpO2 97%   Physical Exam Vitals and nursing note reviewed.  Constitutional:      General: He is not in acute distress.    Appearance: He is well-developed.  HENT:     Head: Normocephalic and atraumatic.  Eyes:     Conjunctiva/sclera: Conjunctivae normal.  Cardiovascular:     Rate and Rhythm: Normal rate and regular rhythm.     Heart sounds: No murmur heard. Pulmonary:     Effort: Pulmonary effort is normal. No respiratory distress.     Breath sounds: Normal breath sounds.  Abdominal:     Palpations: Abdomen is soft.     Tenderness: There is no abdominal tenderness. There is right CVA tenderness.  Musculoskeletal:        General: No swelling.     Cervical back: Neck supple.  Skin:    General: Skin is warm and dry.  Neurological:     Mental Status: He is alert.  Psychiatric:        Mood and Affect: Mood normal.     (all labs ordered are listed, but only abnormal results are displayed) Labs Reviewed  URINALYSIS, ROUTINE W REFLEX MICROSCOPIC - Abnormal; Notable for the following components:      Result Value   Color, Urine AMBER (*)    APPearance CLOUDY (*)    Hgb urine dipstick LARGE (*)    Ketones, ur 5 (*)    Protein, ur 100 (*)    Nitrite POSITIVE (*)    Leukocytes,Ua LARGE (*)    Bacteria, UA MANY (*)    All other components within normal limits  CBC WITH DIFFERENTIAL/PLATELET - Abnormal; Notable for the following components:   Platelets 142 (*)    All other components within normal limits  COMPREHENSIVE METABOLIC PANEL WITH GFR - Abnormal; Notable for the following components:   Glucose, Bld 122 (*)    Creatinine, Ser 1.27 (*)    GFR, Estimated 59 (*)    All other components within normal limits    EKG: None  Radiology: No results found.  {Document cardiac monitor, telemetry assessment procedure when appropriate:32947} Procedures   Medications  Ordered in the ED - No data to display    {Click here for ABCD2, HEART and other calculators REFRESH Note before signing:1}                              Medical Decision Making Amount and/or Complexity of Data Reviewed Labs: ordered. Radiology: ordered.   This patient presents to the ED for  concern of dysuria, chills, this involves an extensive number of treatment options, and is a complaint that carries with it a high risk of complications and morbidity.  The differential diagnosis includes urinary tract infection, acute cystitis, pyelonephritis, nephrolithiasis, others   Co morbidities / Chronic conditions that complicate the patient evaluation  Hypertension   Additional history obtained:  Additional history obtained from EMR External records from outside source obtained and reviewed including urgent care notes   Lab Tests:  I Ordered, and personally interpreted labs.  The pertinent results include: Urine with large hemoglobin, nitrite positive, large leukocytes, many bacteria   Imaging Studies ordered:  I ordered imaging studies including CT abdomen pelvis without contrast I independently visualized and interpreted imaging which showed *** I agree with the radiologist interpretation   Cardiac Monitoring: / EKG:  The patient was maintained on a cardiac monitor.  I personally viewed and interpreted the cardiac monitored which showed an underlying rhythm of: ***   Problem List / ED Course / Critical interventions / Medication management  *** I ordered medication including ***   Reevaluation of the patient after these medicines showed that the patient *** I have reviewed the patients home medicines and have made adjustments as needed   Consultations Obtained:  I requested consultation with the ***,  and discussed lab and imaging findings as well as pertinent plan - they recommend: ***   Social Determinants of Health:  ***   Test / Admission -  Considered:  ***   {Document critical care time when appropriate  Document review of labs and clinical decision tools ie CHADS2VASC2, etc  Document your independent review of radiology images and any outside records  Document your discussion with family members, caretakers and with consultants  Document social determinants of health affecting pt's care  Document your decision making why or why not admission, treatments were needed:32947:::1}   Final diagnoses:  None    ED Discharge Orders     None

## 2023-12-24 NOTE — ED Provider Notes (Signed)
  EMERGENCY DEPARTMENT AT [facility] Provider Note   CSN: [id number] Arrival date & time: 12/24/23  1944     Patient presents with: Polyuria / Dysuria   Warren Weeks is a 78 y.o. male.  Patient with past medical history significant for hypertension, coronary artery disease, pulmonary hypertension, COPD presents the emergency room complaining of 1 day of chills, dysuria, urinary frequency.  He was seen in urgent care was concerned about right-sided flank pain with potential for pyelonephritis versus nephrolithiasis.  Urine at that time appeared grossly infected.  He denies nausea, vomiting, abdominal pain, chest pain, shortness of breath, fevers.   HPI     Prior to Admission medications   Medication Sig Start Date End Date Taking? Authorizing Provider  sulfamethoxazole -trimethoprim  (BACTRIM  DS) 800-160 MG tablet Take 1 tablet by mouth 2 (two) times daily for 7 days. 12/25/23 01/01/24 Yes Logan Ubaldo NOVAK, PA-C  acetaminophen  (TYLENOL ) 650 MG CR tablet Take 650 mg by mouth at bedtime.    [provider]  aspirin  81 MG EC tablet Take 1 tablet (81 mg total) by mouth daily. Swallow whole. 10/07/21   Norleen Lynwood ORN, MD  Azelastine  HCl 137 MCG/SPRAY SOLN SMARTSIG:1-2 Spray(s) Both Nares Twice Daily PRN 07/18/23   [provider]  Cyanocobalamin  (B-12 PO) Take 2,500 mcg by mouth daily.    [provider]  diclofenac  Sodium (VOLTAREN ) 1 % GEL Apply 2 g topically daily.    [provider]  Diclofenac  Sodium CR 100 MG 24 hr tablet Take 1 tablet by mouth once daily 08/25/22   Norleen Lynwood ORN, MD  ezetimibe  (ZETIA ) 10 MG tablet Take 1 tablet by mouth once daily 04/20/23   Norleen Lynwood ORN, MD  fluticasone  (FLONASE ) 50 MCG/ACT nasal spray Place 1 spray into both nostrils in the morning and at bedtime. USE 2 SPRAY(S) IN EACH NOSTRIL ONCE DAILY 09/30/23   Hunsucker, Donnice SAUNDERS, MD  fluticasone -salmeterol (ADVAIR) 250-50 MCG/ACT AEPB INHALE 1 DOSE BY MOUTH IN THE  MORNING AND IN THE EVENING 03/31/23   Hunsucker, Donnice SAUNDERS, MD  furosemide  (LASIX ) 40 MG tablet Take 1 tablet (40 mg total) by mouth daily. 02/04/23   Rolan Ezra RAMAN, MD  lisinopril  (ZESTRIL ) 20 MG tablet Take 1 tablet by mouth once daily 06/28/23   Norleen Lynwood ORN, MD  Magnesium 250 MG TABS Take 250 mg by mouth daily.    [provider]  Melatonin 10 MG TABS Take 10 mg by mouth at bedtime.     [provider]  methocarbamol  (ROBAXIN ) 500 MG tablet Take 1 tablet (500 mg total) by mouth 2 (two) times daily as needed. 09/08/23   Norleen Lynwood ORN, MD  metoprolol  tartrate (LOPRESSOR ) 50 MG tablet Take 1 tablet by mouth twice daily 03/15/23   Norleen Lynwood ORN, MD  Multiple Vitamin (ONE-A-DAY 55 PLUS PO) Take 1 tablet by mouth daily. Men    [provider]  Omega-3 Fatty Acids (FISH OIL) 1000 MG CAPS Take 1,000 mg by mouth daily.    [provider]  omeprazole  (PRILOSEC) 20 MG capsule Take 1 capsule by mouth twice daily 02/24/23   Norleen Lynwood ORN, MD  potassium chloride  SA (KLOR-CON  M) 20 MEQ tablet Take 1 tablet (20 mEq total) by mouth daily. 02/04/23   Rolan Ezra RAMAN, MD  rosuvastatin  (CRESTOR ) 40 MG tablet Take 1 tablet by mouth once daily 10/11/23   Norleen Lynwood ORN, MD  sildenafil  (VIAGRA ) 100 MG tablet Take 1 tablet (100 mg  total) by mouth as needed for erectile dysfunction. 12/20/12   Norleen Lynwood ORN, MD  solifenacin  (VESICARE ) 5 MG tablet Take 1 tablet (5 mg total) by mouth daily. 07/05/20   Norleen Lynwood ORN, MD  terazosin  (HYTRIN ) 5 MG capsule Take 5 mg by mouth at bedtime.  03/22/15   [provider]  Testosterone  20.25 MG/1.25GM (1.62%) GEL Apply 2 Packages topically daily. On each shoulder 08/05/20   [provider]  thiamine  250 MG tablet Take 250 mg by mouth daily.    [provider]  tirzepatide  (MOUNJARO ) 5 MG/0.5ML Pen Inject 5 mg into the skin once a week. 11/26/23   Norleen Lynwood ORN, MD  Turmeric 500 MG TABS Take 1,000 mg by mouth daily. ginger    [provider]    Allergies: Cefuroxime axetil    Review of Systems  Updated Vital Signs BP 113/69 (BP Location: Right Arm)   Pulse (!) 120   Temp 98.3 F (36.8 C)   Resp 20   SpO2 97%   Physical Exam Vitals and nursing note reviewed.  Constitutional:      General: He is not in acute distress.    Appearance: He is well-developed.  HENT:     Head: Normocephalic and atraumatic.  Eyes:     Conjunctiva/sclera: Conjunctivae normal.  Cardiovascular:     Rate and Rhythm: Normal rate and regular rhythm.     Heart sounds: No murmur heard. Pulmonary:     Effort: Pulmonary effort is normal. No respiratory distress.     Breath sounds: Normal breath sounds.  Abdominal:     Palpations: Abdomen is soft.     Tenderness: There is no abdominal tenderness. There is right CVA tenderness.  Musculoskeletal:        General: No swelling.     Cervical back: Neck supple.  Skin:    General: Skin is warm and dry.  Neurological:     Mental Status: He is alert.  Psychiatric:        Mood and Affect: Mood normal.     (all labs ordered are listed, but only abnormal results are displayed) Labs Reviewed  URINALYSIS, ROUTINE W REFLEX MICROSCOPIC - Abnormal; Notable for the following components:      Result Value   Color, Urine AMBER (*)    APPearance CLOUDY (*)    Hgb urine dipstick LARGE (*)    Ketones, ur 5 (*)    Protein, ur 100 (*)    Nitrite POSITIVE (*)    Leukocytes,Ua LARGE (*)    Bacteria, UA MANY (*)    All other components within normal limits  CBC WITH DIFFERENTIAL/PLATELET - Abnormal; Notable for the following components:   Platelets 142 (*)    All other components within normal limits  COMPREHENSIVE METABOLIC PANEL WITH GFR - Abnormal; Notable for the following components:   Glucose, Bld 122 (*)    Creatinine, Ser 1.27 (*)    GFR, Estimated 59 (*)    All other components within normal limits  URINE CULTURE    EKG: None  Radiology: CT ABDOMEN PELVIS WO  CONTRAST Result Date: 12/24/2023 CLINICAL DATA:  Abdominal/flank pain, stone suspected EXAM: CT ABDOMEN AND PELVIS WITHOUT CONTRAST TECHNIQUE: Multidetector CT imaging of the abdomen and pelvis was performed following the standard protocol without IV contrast. RADIATION DOSE REDUCTION: This exam was performed according to the departmental dose-optimization program which includes automated exposure control, adjustment of the mA and/or kV according to patient size and/or use of  iterative reconstruction technique. COMPARISON:  CT renal 09/16/2022 FINDINGS: Lower chest: No acute abnormality. Hepatobiliary: No focal liver abnormality. No gallstones, gallbladder wall thickening, or pericholecystic fluid. No biliary dilatation. Pancreas: No focal lesion. Normal pancreatic contour. No surrounding inflammatory changes. No main pancreatic ductal dilatation. Spleen: Normal in size without focal abnormality. Adrenals/Urinary Tract: No adrenal nodule bilaterally. No nephrolithiasis and no hydronephrosis. No definite contour-deforming renal mass. No ureterolithiasis or hydroureter. The urinary bladder is unremarkable. Stomach/Bowel: Stomach is within normal limits. No evidence of bowel wall thickening or dilatation. Few scattered colonic diverticula. Appendix appears normal. Vascular/Lymphatic: No abdominal aorta or iliac aneurysm. Mild - moderate atherosclerotic plaque of the aorta and its branches. No abdominal, pelvic, or inguinal lymphadenopathy. Reproductive: Prostate is unremarkable. Other: No intraperitoneal free fluid. No intraperitoneal free gas. No organized fluid collection. Musculoskeletal: No abdominal wall hernia or abnormality. No suspicious lytic or blastic osseous lesions. No acute displaced fracture. Multilevel degenerative changes of the spine. IMPRESSION: 1. No acute intra-abdominal or intrapelvic abnormality with limited evaluation on this noncontrast. 2. Colonic diverticulosis with no acute diverticulitis.  3.  Aortic Atherosclerosis (ICD10-I70.0). Electronically Signed   By: Morgane  Naveau M.D.   On: 12/24/2023 23:49     Procedures   Medications Ordered in the ED  sulfamethoxazole -trimethoprim  (BACTRIM  DS) 800-160 MG per tablet 1 tablet (has no administration in time range)                                    Medical Decision Making Amount and/or Complexity of Data Reviewed Labs: ordered. Radiology: ordered.   This patient presents to the ED for concern of dysuria, chills, this involves an extensive number of treatment options, and is a complaint that carries with it a high risk of complications and morbidity.  The differential diagnosis includes urinary tract infection, acute cystitis, pyelonephritis, nephrolithiasis, others   Co morbidities / Chronic conditions that complicate the patient evaluation  Hypertension   Additional history obtained:  Additional history obtained from EMR External records from outside source obtained and reviewed including urgent care notes   Lab Tests:  I Ordered, and personally interpreted labs.  The pertinent results include: Urine with large hemoglobin, nitrite positive, large leukocytes, many bacteria   Imaging Studies ordered:  I ordered imaging studies including CT abdomen pelvis without contrast I independently visualized and interpreted imaging which showed  1. No acute intra-abdominal or intrapelvic abnormality with limited  evaluation on this noncontrast.  2. Colonic diverticulosis with no acute diverticulitis.  3.  Aortic Atherosclerosi   I agree with the radiologist interpretation  Medications administered:              I ordered the patient a dose of bactrim  for antibiotic coverage   Social Determinants of Health:  Patient is a former smoker   Test / Admission - Considered:  Patient with workup consistent with acute cystitis.  No abnormalities noted on CT scan.  I rechecked the patient's heart rate at bedside and the  patient's heart rate was in the 90s.  It would spike to approximately 120 when I first talked to the patient but then quickly returned to a normal rate after talking for a short period of time.  No signs of urosepsis, pyelonephritis.  Patient does endorse a cephalosporin allergy.  Plan to discharge with prescription for Bactrim .  Urine culture ordered.  Plan to discharge home with return precautions.  Final diagnoses:  Acute cystitis with hematuria    ED Discharge Orders          Ordered    sulfamethoxazole -trimethoprim  (BACTRIM  DS) 800-160 MG tablet  2 times daily        12/25/23 0023               Logan Ubaldo NOVAK, PA-C 12/25/23 [id number]    Bari Charmaine FALCON, MD 12/26/23 [phone]

## 2023-12-24 NOTE — ED Triage Notes (Signed)
 Patient reports polyuria with dysuria and low back pain today , denies injury, no fever or chills .

## 2023-12-24 NOTE — ED Provider Notes (Signed)
 [facility]    CSN: [id number] Arrival date & time: 12/24/23  1906      History   Chief Complaint Chief Complaint  Patient presents with   Urinary Frequency    HPI Warren Weeks is a 78 y.o. male.   Patient presents with urinary frequency/urgency, dysuria, right flank pain, and chills that began this morning.  Patient denies any known fever, hematuria, weakness, and confusion.  Patient states that he has had some intermittent nausea throughout the day as well.  The history is provided by the patient and medical records.  Urinary Frequency    Past Medical History:  Diagnosis Date   ALLERGIC RHINITIS 09/16/2007   ALLERGY 03/24/2007   Allergy    ANXIETY 03/28/2007   Arthritis    knees, back,shoulder   CAROTID ARTERY STENOSIS, BILATERAL 07/01/2009   yearly checks - no current problems   Cataract    bilateral,removed   CEPHALGIA 06/08/2009   CHEST PAIN 09/16/2007   no current problems   COLONIC POLYPS, HX OF 09/16/2007   COPD (chronic obstructive pulmonary disease) (HCC) 05/12/2017   mild per patient - no inhaler   Dizziness and giddiness 06/08/2009   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   DYSPNEA/SHORTNESS OF BREATH 09/16/2007   ERECTILE DYSFUNCTION 03/28/2007   ESOPHAGEAL STRICTURE 09/16/2007   ESOPHAGITIS 03/24/2007   GERD 03/24/2007   GLUCOSE INTOLERANCE 09/16/2007   HEMORRHOIDS, INTERNAL 03/24/2007   HYPERCHOLESTEROLEMIA 03/24/2007   HYPERLIPIDEMIA 09/16/2007   HYPERTENSION 03/28/2007   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/2011   Impaired glucose tolerance 10/04/2010   LOW BACK PAIN 03/28/2007   MAGNETIC RESONANCE IMAGING, BRAIN, ABNORMAL 06/26/2009   NECK PAIN, LEFT 06/20/2010   OBESITY 03/24/2007   OTITIS MEDIA, LEFT 06/10/2009   Palpitations 06/20/2010   no current problems   PVD 06/25/2009   RASH-NONVESICULAR 06/17/2009   Sleep apnea 12/25/2021   URI 08/16/2009    Patient Active Problem List   Diagnosis Date Noted   Nocturnal leg cramps 09/11/2023    Vitamin D  deficiency 03/13/2023   CKD (chronic kidney disease) 03/11/2023   Pulmonary hypertension (HCC) 01/22/2023   Asthma 12/16/2022   Right flank pain 09/01/2022   Thrush 07/03/2022   Left leg pain 02/08/2022   Voice hoarseness 02/08/2022   Cramp in lower leg 02/08/2022   Left hand pain 02/08/2022   Sleep apnea 12/25/2021   Loud snoring 10/17/2021   Coronary artery disease, non-occlusive    Degenerative joint disease of cervical spine 03/12/2021   Acute sinus infection 12/25/2020   Iliotibial band syndrome affecting left lower leg 09/08/2020   Neck pain on right side 09/05/2020   OAB (overactive bladder) 07/05/2020   Pain and swelling of right lower leg 07/05/2020   Cervicalgia 07/05/2020   Right knee pain 07/05/2020   PVC's (premature ventricular contractions) 03/02/2020   BPH (benign prostatic hyperplasia) 03/02/2020   Arthritis 03/02/2020   Diastolic dysfunction 09/14/2019   Urinary frequency 09/14/2019   Urinary leakage 12/07/2018   Left knee pain 12/07/2018   Chronic sinusitis 10/05/2018   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   Pleurisy 03/16/2017   Bilateral leg pain 11/22/2016   Insomnia 10/13/2016   Cough 07/22/2016   Wheezing 07/22/2016   Peripheral edema 02/21/2016   Venous insufficiency 02/05/2016   Low back pain radiating to lower extremity 02/05/2016   Vertigo    Hypersomnolence 10/24/2015   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   Chest pain 01/18/2013   Hematochezia 10/12/2012   Viral illness 05/26/2012   Palpitations 01/05/2012   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/2011  Fatigue 04/09/2011   Rash 12/22/2010   Encounter for long-term (current) use of high-risk medication 10/08/2010   Diabetes (HCC) 10/04/2010   Encounter for well adult exam with abnormal findings 10/04/2010   Carotid artery stenosis 07/01/2009   PVD 06/25/2009   Allergic rhinitis 09/16/2007   ESOPHAGEAL STRICTURE 09/16/2007   DOE (dyspnea on exertion) 09/16/2007   History of colonic polyps  09/16/2007   Anxiety state 03/28/2007   ERECTILE DYSFUNCTION 03/28/2007   Hyperlipidemia 03/28/2007   Essential hypertension 03/28/2007   LOW BACK PAIN 03/28/2007   OBESITY 03/24/2007   Internal hemorrhoids 03/24/2007   Esophagitis 03/24/2007   GERD 03/24/2007    Past Surgical History:  Procedure Laterality Date   COLONOSCOPY  03/2013   polyps/[place]   LEFT HEART CATH AND CORONARY ANGIOGRAPHY N/A 10/14/2021   Procedure: LEFT HEART CATH AND CORONARY ANGIOGRAPHY;  Surgeon: Dann Candyce RAMAN, MD;  [facility]: [facility];  Service: Cardiovascular;  Laterality: N/A;   NASAL SEPTOPLASTY W/ TURBINOPLASTY Bilateral 01/03/2021   Procedure: NASAL SEPTOPLASTY WITH BILATERAL TURBINATE REDUCTION;  Surgeon: Karis Clunes, MD;  Location: [facility];  Service: ENT;  Laterality: Bilateral;   RIGHT HEART CATH N/A 02/04/2023   Procedure: RIGHT HEART CATH;  Surgeon: Rolan Ezra RAMAN, MD;  Location: [facility];  Service: Cardiovascular;  Laterality: N/A;   s/p right knee arthroscopy     UPPER GASTROINTESTINAL ENDOSCOPY     gerd       Home Medications    Prior to Admission medications   Medication Sig Start Date End Date Taking? Authorizing Provider  acetaminophen  (TYLENOL ) 650 MG CR tablet Take 650 mg by mouth at bedtime.    [provider]  aspirin  81 MG EC tablet Take 1 tablet (81 mg total) by mouth daily. Swallow whole. 10/07/21   Norleen Lynwood ORN, MD  Azelastine  HCl 137 MCG/SPRAY SOLN SMARTSIG:1-2 Spray(s) Both Nares Twice Daily PRN 07/18/23   [provider]  Cyanocobalamin  (B-12 PO) Take 2,500 mcg by mouth daily.    [provider]  diclofenac  Sodium (VOLTAREN ) 1 % GEL Apply 2 g topically daily.    [provider]  Diclofenac  Sodium CR 100 MG 24 hr tablet Take 1 tablet by mouth once daily 08/25/22   Norleen Lynwood ORN, MD  ezetimibe  (ZETIA ) 10 MG tablet Take 1 tablet by mouth once daily 04/20/23   Norleen Lynwood ORN, MD  fluticasone  (FLONASE ) 50  MCG/ACT nasal spray Place 1 spray into both nostrils in the morning and at bedtime. USE 2 SPRAY(S) IN EACH NOSTRIL ONCE DAILY 09/30/23   Hunsucker, Donnice SAUNDERS, MD  fluticasone -salmeterol (ADVAIR) 250-50 MCG/ACT AEPB INHALE 1 DOSE BY MOUTH IN THE MORNING AND IN THE EVENING 03/31/23   Hunsucker, Donnice SAUNDERS, MD  furosemide  (LASIX ) 40 MG tablet Take 1 tablet (40 mg total) by mouth daily. 02/04/23   Rolan Ezra RAMAN, MD  lisinopril  (ZESTRIL ) 20 MG tablet Take 1 tablet by mouth once daily 06/28/23   Norleen Lynwood ORN, MD  Magnesium 250 MG TABS Take 250 mg by mouth daily.    [provider]  Melatonin 10 MG TABS Take 10 mg by mouth at bedtime.     [provider]  methocarbamol  (ROBAXIN ) 500 MG tablet Take 1 tablet (500 mg total) by mouth 2 (two) times daily as needed. 09/08/23   Norleen Lynwood ORN, MD  metoprolol  tartrate (LOPRESSOR ) 50 MG tablet Take 1 tablet by mouth twice daily 03/15/23   Norleen Lynwood ORN, MD  Multiple Vitamin (ONE-A-DAY 55 PLUS PO) Take 1 tablet by mouth daily. Men    [provider]  Omega-3 Fatty Acids (FISH OIL) 1000 MG CAPS Take 1,000 mg by mouth daily.    [provider]  omeprazole  (PRILOSEC) 20 MG capsule Take 1 capsule by mouth twice daily 02/24/23   Norleen Lynwood ORN, MD  potassium chloride  SA (KLOR-CON  M) 20 MEQ tablet Take 1 tablet (20 mEq total) by mouth daily. 02/04/23   Rolan Ezra RAMAN, MD  rosuvastatin  (CRESTOR ) 40 MG tablet Take 1 tablet by mouth once daily 10/11/23   Norleen Lynwood ORN, MD  sildenafil  (VIAGRA ) 100 MG tablet Take 1 tablet (100 mg total) by mouth as needed for erectile dysfunction. 12/20/12   Norleen Lynwood ORN, MD  solifenacin  (VESICARE ) 5 MG tablet Take 1 tablet (5 mg total) by mouth daily. 07/05/20   Norleen Lynwood ORN, MD  terazosin  (HYTRIN ) 5 MG capsule Take 5 mg by mouth at bedtime.  03/22/15   [provider]  Testosterone  20.25 MG/1.25GM (1.62%) GEL Apply 2 Packages topically daily. On each shoulder 08/05/20   [provider]  thiamine  250  MG tablet Take 250 mg by mouth daily.    [provider]  tirzepatide  (MOUNJARO ) 5 MG/0.5ML Pen Inject 5 mg into the skin once a week. 11/26/23   Norleen Lynwood ORN, MD  Turmeric 500 MG TABS Take 1,000 mg by mouth daily. ginger    [provider]    Family History Family History  Problem Relation Age of Onset   Lymphoma Mother    Heart disease Father        CHF   Ovarian cancer Sister    Lung cancer Sister    Coronary artery disease Sister        CABG   Coronary artery disease Brother        stent, and CAD   Colon cancer Neg Hx    Esophageal cancer Neg Hx    Rectal cancer Neg Hx    Stomach cancer Neg Hx    Colon polyps Neg Hx    Crohn's disease Neg Hx    Ulcerative colitis Neg Hx     Social History Social History   Tobacco Use   Smoking status: Former    Current packs/day: 0.00    Average packs/day: 0.5 packs/day for 15.0 years (7.5 ttl pk-yrs)    Types: Cigarettes    Start date: 06/22/1976    Quit date: 06/23/1991    Years since quitting: 32.5   Smokeless tobacco: Never   Tobacco comments:    Quit 30 years ago  Vaping Use   Vaping status: Never Used  Substance Use Topics   Alcohol use: Not Currently    Alcohol/week: 7.0 standard drinks of alcohol    Types: 7 Cans of beer per week   Drug use: No     Allergies   Cefuroxime axetil   Review of Systems Review of Systems  Genitourinary:  Positive for frequency.   Per HPI  Physical Exam Triage Vital Signs ED Triage Vitals  Encounter Vitals Group     BP 12/24/23 1915 (!) 94/57     Girls Systolic BP Percentile --      Girls Diastolic BP Percentile --      Boys Systolic BP Percentile --      Boys Diastolic BP Percentile --      Pulse Rate 12/24/23 1915 (!) 112     Resp 12/24/23 1915 16  Temp 12/24/23 1915 98.1 F (36.7 C)     Temp Source 12/24/23 1915 Oral     SpO2 12/24/23 1915 96 %     Weight --      Height --      Head Circumference --      Peak Flow --      Pain Score 12/24/23 1913 8      Pain Loc --      Pain Education --      Exclude from Growth Chart --    No data found.  Updated Vital Signs BP (!) 94/57 (BP Location: Left Arm)   Pulse (!) 112   Temp 98.1 F (36.7 C) (Oral)   Resp 16   SpO2 96%   Visual Acuity Right Eye Distance:   Left Eye Distance:   Bilateral Distance:    Right Eye Near:   Left Eye Near:    Bilateral Near:     Physical Exam Vitals and nursing note reviewed.  Constitutional:      General: He is awake. He is not in acute distress.    Appearance: Normal appearance. He is well-developed and well-groomed. He is not ill-appearing.  Cardiovascular:     Rate and Rhythm: Regular rhythm. Tachycardia present.  Pulmonary:     Effort: Pulmonary effort is normal.     Breath sounds: Normal breath sounds.  Abdominal:     General: Abdomen is flat. Bowel sounds are normal. There is no distension.     Palpations: Abdomen is soft.     Tenderness: There is no abdominal tenderness. There is right CVA tenderness. There is no left CVA tenderness, guarding or rebound.  Skin:    General: Skin is warm and dry.  Neurological:     General: No focal deficit present.     Mental Status: He is alert and oriented to person, place, and time. Mental status is at baseline.  Psychiatric:        Behavior: Behavior is cooperative.      UC Treatments / Results  Labs (all labs ordered are listed, but only abnormal results are displayed) Labs Reviewed  POCT URINALYSIS DIP (MANUAL ENTRY) - Abnormal; Notable for the following components:      Result Value   Clarity, UA cloudy (*)    Ketones, POC UA small (15) (*)    Blood, UA moderate (*)    Protein Ur, POC =30 (*)    Nitrite, UA Positive (*)    Leukocytes, UA Moderate (2+) (*)    All other components within normal limits    EKG   Radiology No results found.  Procedures Procedures (including critical care time)  Medications Ordered in UC Medications - No data to display  Initial Impression /  Assessment and Plan / UC Course  I have reviewed the triage vital signs and the nursing notes.  Pertinent labs & imaging results that were available during my care of the patient were reviewed by me and considered in my medical decision making (see chart for details).     Patient is overall well-appearing.  Hypotension present with blood pressure of 94/57.  Tachycardia present with heart rate in 112.  Vitals currently meeting SIRS criteria.  Right CVA tenderness noted on exam.  No abdominal tenderness reveals moderate leukocytes, positive nitrates, moderate RBCs, and small amounts of protein and ketones.  This is indicative of urinary tract infection.  Concern for pyelonephritis.  Recommended patient be seen in the emergency department for further evaluation  to rule out underlying urosepsis due to vital signs in clinic today and systemic symptoms.  Patient is agreeable to plan at this time.  Patient is stable to arrived to the ER via POV. Final Clinical Impressions(s) / UC Diagnoses   Final diagnoses:  Acute pyelonephritis  Tachycardia  Hypotension, unspecified hypotension type     Discharge Instructions      Please go to the emergency department for further evaluation.    ED Prescriptions   None    PDMP not reviewed this encounter.   Johnie Flaming A, NP 12/24/23 [phone]

## 2023-12-24 NOTE — Discharge Instructions (Signed)
Please go to the emergency department for further evaluation.

## 2023-12-24 NOTE — ED Notes (Signed)
 Patient is being discharged from the Urgent Care and sent to the Emergency Department via private vehicle . Per [facility], patient is in need of higher level of care due to R/O urosepsis . Patient is aware and verbalizes understanding of plan of care.  Vitals:   12/24/23 1915  BP: (!) 94/57  Pulse: (!) 112  Resp: 16  Temp: 98.1 F (36.7 C)  SpO2: 96%

## 2023-12-24 NOTE — ED Triage Notes (Addendum)
 Pt states urinary frequency, right flank pain  and chills since this am.

## 2023-12-25 MED ORDER — SULFAMETHOXAZOLE-TRIMETHOPRIM 800-160 MG PO TABS: 1.0000 | ORAL_TABLET | Freq: Two times a day (BID) | ORAL | 0 refills | Status: AC

## 2023-12-25 MED ORDER — SULFAMETHOXAZOLE-TRIMETHOPRIM 800-160 MG PO TABS
1.0000 | ORAL_TABLET | Freq: Once | ORAL | Status: AC
  Administered 2023-12-25: 1 via ORAL
  Filled 2023-12-25: qty 1

## 2023-12-25 NOTE — Discharge Instructions (Signed)
 You were evaluated this evening and found to have a urinary tract infection.  Please take the prescribed antibiotics as directed.  A urine culture was sent.  If the antibiotic needs to be changed you will be contacted.  Please follow-up with your primary care provider for further evaluation as needed.  If you develop any life-threatening symptoms return to the emergency department.

## 2023-12-31 ENCOUNTER — Ambulatory Visit: Payer: Self-pay | Admitting: Internal Medicine

## 2023-12-31 ENCOUNTER — Ambulatory Visit: Admitting: Internal Medicine

## 2023-12-31 VITALS — BP 116/60 | HR 89 | Temp 97.9°F | Ht 69.0 in | Wt 228.0 lb

## 2023-12-31 DIAGNOSIS — E559 Vitamin D deficiency, unspecified: Secondary | ICD-10-CM | POA: Diagnosis not present

## 2023-12-31 DIAGNOSIS — Z7985 Long-term (current) use of injectable non-insulin antidiabetic drugs: Secondary | ICD-10-CM | POA: Diagnosis not present

## 2023-12-31 DIAGNOSIS — E1165 Type 2 diabetes mellitus with hyperglycemia: Secondary | ICD-10-CM | POA: Diagnosis not present

## 2023-12-31 DIAGNOSIS — R350 Frequency of micturition: Secondary | ICD-10-CM

## 2023-12-31 DIAGNOSIS — R300 Dysuria: Secondary | ICD-10-CM | POA: Insufficient documentation

## 2023-12-31 LAB — URINALYSIS, ROUTINE W REFLEX MICROSCOPIC
Ketones, ur: NEGATIVE
Nitrite: NEGATIVE
Specific Gravity, Urine: 1.02 (ref 1.000–1.030)
Total Protein, Urine: 30 — AB
Urine Glucose: NEGATIVE
Urobilinogen, UA: 1 (ref 0.0–1.0)
pH: 6 (ref 5.0–8.0)

## 2023-12-31 MED ORDER — TIRZEPATIDE 7.5 MG/0.5ML ~~LOC~~ SOAJ
7.5000 mg | SUBCUTANEOUS | 3 refills | Status: DC
Start: 2023-12-31 — End: 2024-01-28

## 2023-12-31 MED ORDER — CIPROFLOXACIN HCL 500 MG PO TABS: 500.0000 mg | ORAL_TABLET | Freq: Two times a day (BID) | ORAL | 0 refills | Status: AC

## 2023-12-31 NOTE — Progress Notes (Unsigned)
 Patient ID: Warren Weeks, male   DOB: 1945-11-26, 78 y.o.   MRN: [id number]        Chief Complaint: follow up recent UTI       HPI:  Warren Weeks is a 78 y.o. male here with c/o persistent symptoms of dysuria, frequency every 1 hr, but Denies urinary symptoms such as dysuria, frequency, urgency, flank pain, hematuria or n/v, fever, chills.  Was seen at ED 7/4 with abnormal UA, tx with septra  x 7 days, no culture completed.  Pt denies chest pain, increased sob or doe, wheezing, orthopnea, PND, increased LE swelling, palpitations, dizziness or syncope.   Pt denies polydipsia, polyuria, or new focal neuro s/s.         Wt Readings from Last 3 Encounters:  12/31/23 228 lb (103.4 kg)  11/05/23 241 lb 3.2 oz (109.4 kg)  09/30/23 245 lb (111.1 kg)   BP Readings from Last 3 Encounters:  12/31/23 116/60  12/25/23 122/70  12/24/23 (!) 94/57         Past Medical History:  Diagnosis Date   ALLERGIC RHINITIS 09/16/2007   ALLERGY 03/24/2007   Allergy    ANXIETY 03/28/2007   Arthritis    knees, back,shoulder   CAROTID ARTERY STENOSIS, BILATERAL 07/01/2009   yearly checks - no current problems   Cataract    bilateral,removed   CEPHALGIA 06/08/2009   CHEST PAIN 09/16/2007   no current problems   COLONIC POLYPS, HX OF 09/16/2007   COPD (chronic obstructive pulmonary disease) (HCC) 05/12/2017   mild per patient - no inhaler   Dizziness and giddiness 06/08/2009   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   DYSPNEA/SHORTNESS OF BREATH 09/16/2007   ERECTILE DYSFUNCTION 03/28/2007   ESOPHAGEAL STRICTURE 09/16/2007   ESOPHAGITIS 03/24/2007   GERD 03/24/2007   GLUCOSE INTOLERANCE 09/16/2007   HEMORRHOIDS, INTERNAL 03/24/2007   HYPERCHOLESTEROLEMIA 03/24/2007   HYPERLIPIDEMIA 09/16/2007   HYPERTENSION 03/28/2007   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/2011   Impaired glucose tolerance 10/04/2010   LOW BACK PAIN 03/28/2007   MAGNETIC RESONANCE IMAGING, BRAIN, ABNORMAL 06/26/2009   NECK PAIN, LEFT 06/20/2010    OBESITY 03/24/2007   OTITIS MEDIA, LEFT 06/10/2009   Palpitations 06/20/2010   no current problems   PVD 06/25/2009   RASH-NONVESICULAR 06/17/2009   Sleep apnea 12/25/2021   URI 08/16/2009   Past Surgical History:  Procedure Laterality Date   COLONOSCOPY  03/2013   polyps/[place]   LEFT HEART CATH AND CORONARY ANGIOGRAPHY N/A 10/14/2021   Procedure: LEFT HEART CATH AND CORONARY ANGIOGRAPHY;  Surgeon: Dann Candyce RAMAN, MD;  Location: [facility];  Service: Cardiovascular;  Laterality: N/A;   NASAL SEPTOPLASTY W/ TURBINOPLASTY Bilateral 01/03/2021   Procedure: NASAL SEPTOPLASTY WITH BILATERAL TURBINATE REDUCTION;  Surgeon: Karis Clunes, MD;  Location: [facility];  Service: ENT;  Laterality: Bilateral;   RIGHT HEART CATH N/A 02/04/2023   Procedure: RIGHT HEART CATH;  Surgeon: Rolan Ezra RAMAN, MD;  Location: [facility];  Service: Cardiovascular;  Laterality: N/A;   s/p right knee arthroscopy     UPPER GASTROINTESTINAL ENDOSCOPY     gerd    reports that he quit smoking about 32 years ago. His smoking use included cigarettes. He started smoking about 47 years ago. He has a 7.5 pack-year smoking history. He has never used smokeless tobacco. He reports that he does not currently use alcohol after a past usage of about 7.0 standard drinks of alcohol per week. He reports that he does not use drugs.  family history includes Coronary artery disease in his brother and sister; Heart disease in his father; Lung cancer in his sister; Lymphoma in his mother; Ovarian cancer in his sister. Allergies  Allergen Reactions   Cefuroxime Axetil Rash   Current Outpatient Medications on File Prior to Visit  Medication Sig Dispense Refill   acetaminophen  (TYLENOL ) 650 MG CR tablet Take 650 mg by mouth at bedtime.     aspirin  81 MG EC tablet Take 1 tablet (81 mg total) by mouth daily. Swallow whole. 30 tablet 12   Azelastine  HCl 137 MCG/SPRAY SOLN SMARTSIG:1-2 Spray(s) Both Nares Twice  Daily PRN     Cyanocobalamin  (B-12 PO) Take 2,500 mcg by mouth daily.     diclofenac  Sodium (VOLTAREN ) 1 % GEL Apply 2 g topically daily.     Diclofenac  Sodium CR 100 MG 24 hr tablet Take 1 tablet by mouth once daily 90 tablet 1   ezetimibe  (ZETIA ) 10 MG tablet Take 1 tablet by mouth once daily 90 tablet 3   fluticasone  (FLONASE ) 50 MCG/ACT nasal spray Place 1 spray into both nostrils in the morning and at bedtime. USE 2 SPRAY(S) IN EACH NOSTRIL ONCE DAILY 16 g 3   fluticasone -salmeterol (ADVAIR) 250-50 MCG/ACT AEPB INHALE 1 DOSE BY MOUTH IN THE MORNING AND IN THE EVENING 60 each 11   furosemide  (LASIX ) 40 MG tablet Take 1 tablet (40 mg total) by mouth daily. 60 tablet 3   lisinopril  (ZESTRIL ) 20 MG tablet Take 1 tablet by mouth once daily 90 tablet 3   Magnesium 250 MG TABS Take 250 mg by mouth daily.     Melatonin 10 MG TABS Take 10 mg by mouth at bedtime.      methocarbamol  (ROBAXIN ) 500 MG tablet Take 1 tablet (500 mg total) by mouth 2 (two) times daily as needed. 60 tablet 2   metoprolol  tartrate (LOPRESSOR ) 50 MG tablet Take 1 tablet by mouth twice daily 180 tablet 3   Multiple Vitamin (ONE-A-DAY 55 PLUS PO) Take 1 tablet by mouth daily. Men     Omega-3 Fatty Acids (FISH OIL) 1000 MG CAPS Take 1,000 mg by mouth daily.     omeprazole  (PRILOSEC) 20 MG capsule Take 1 capsule by mouth twice daily 180 capsule 3   potassium chloride  SA (KLOR-CON  M) 20 MEQ tablet Take 1 tablet (20 mEq total) by mouth daily. 60 tablet 3   rosuvastatin  (CRESTOR ) 40 MG tablet Take 1 tablet by mouth once daily 90 tablet 0   sildenafil  (VIAGRA ) 100 MG tablet Take 1 tablet (100 mg total) by mouth as needed for erectile dysfunction. 10 tablet 11   solifenacin  (VESICARE ) 5 MG tablet Take 1 tablet (5 mg total) by mouth daily. 90 tablet 3   sulfamethoxazole -trimethoprim  (BACTRIM  DS) 800-160 MG tablet Take 1 tablet by mouth 2 (two) times daily for 7 days. 14 tablet 0   terazosin  (HYTRIN ) 5 MG capsule Take 5 mg by mouth at  bedtime.      Testosterone  20.25 MG/1.25GM (1.62%) GEL Apply 2 Packages topically daily. On each shoulder     thiamine  250 MG tablet Take 250 mg by mouth daily.     Turmeric 500 MG TABS Take 1,000 mg by mouth daily. ginger     No current facility-administered medications on file prior to visit.        ROS:  All others reviewed and negative.  Objective        PE:  BP 116/60 (BP Location: Right Arm, Patient Position: Sitting,  Cuff Size: Normal)   Pulse 89   Temp 97.9 F (36.6 C) (Oral)   Ht 5' 9 (1.753 m)   Wt 228 lb (103.4 kg)   SpO2 95%   BMI 33.67 kg/m                 Constitutional: Pt appears in mild ill               HENT: Head: NCAT.                Right Ear: External ear normal.                 Left Ear: External ear normal.                Eyes: . Pupils are equal, round, and reactive to light. Conjunctivae and EOM are normal               Nose: without d/c or deformity               Neck: Neck supple. Gross normal ROM               Cardiovascular: Normal rate and regular rhythm.                 Pulmonary/Chest: Effort normal and breath sounds without rales or wheezing.                Abd:  Soft, NT, ND, + BS, no organomegaly               Neurological: Pt is alert. At baseline orientation, motor grossly intact               Skin: Skin is warm. No rashes, no other new lesions, LE edema - none               Psychiatric: Pt behavior is normal without agitation   Micro: none  Cardiac tracings I have personally interpreted today:  none  Pertinent Radiological findings (summarize): none   Lab Results  Component Value Date   WBC 8.2 12/24/2023   HGB 13.6 12/24/2023   HCT 39.7 12/24/2023   PLT 142 (L) 12/24/2023   GLUCOSE 122 (H) 12/24/2023   CHOL 140 09/08/2023   TRIG 83.0 09/08/2023   HDL 48.00 09/08/2023   LDLDIRECT 176.0 04/03/2011   LDLCALC 76 09/08/2023   ALT 33 12/24/2023   AST 35 12/24/2023   NA 137 12/24/2023   K 3.8 12/24/2023   CL 103 12/24/2023    CREATININE 1.27 (H) 12/24/2023   BUN 16 12/24/2023   CO2 22 12/24/2023   TSH 3.11 09/08/2023   PSA 0.21 09/08/2023   HGBA1C 6.5 09/08/2023   MICROALBUR <0.7 09/08/2023   Assessment/Plan:  Warren Weeks is a 78 y.o. Black or African American [2] male with  has a past medical history of ALLERGIC RHINITIS (09/16/2007), ALLERGY (03/24/2007), Allergy, ANXIETY (03/28/2007), Arthritis, CAROTID ARTERY STENOSIS, BILATERAL (07/01/2009), Cataract, CEPHALGIA (06/08/2009), CHEST PAIN (09/16/2007), COLONIC POLYPS, HX OF (09/16/2007), COPD (chronic obstructive pulmonary disease) (HCC) (05/12/2017), Dizziness and giddiness (06/08/2009), Dyshidrotic eczema (06/03/2018), DYSPNEA/SHORTNESS OF BREATH (09/16/2007), ERECTILE DYSFUNCTION (03/28/2007), ESOPHAGEAL STRICTURE (09/16/2007), ESOPHAGITIS (03/24/2007), GERD (03/24/2007), GLUCOSE INTOLERANCE (09/16/2007), HEMORRHOIDS, INTERNAL (03/24/2007), HYPERCHOLESTEROLEMIA (03/24/2007), HYPERLIPIDEMIA (09/16/2007), HYPERTENSION (03/28/2007), Hypogonadism male (10/09/2011), Impaired glucose tolerance (10/04/2010), LOW BACK PAIN (03/28/2007), MAGNETIC RESONANCE IMAGING, BRAIN, ABNORMAL (06/26/2009), NECK PAIN, LEFT (06/20/2010), OBESITY (03/24/2007), OTITIS MEDIA, LEFT (06/10/2009), Palpitations (06/20/2010), PVD (06/25/2009), RASH-NONVESICULAR (06/17/2009), Sleep apnea (12/25/2021), and URI (  08/16/2009).  Urinary frequency Also consider OAB med trial if not improved  Vitamin D  deficiency Last vitamin D  Lab Results  Component Value Date   VD25OH 27.84 (L) 09/08/2023   Low, to start oral replacement   Dysuria Pt now with high suspicion for uti not sensitive it seems to septra .   For ua and culture, change to cipro  500 bid x 10 d  Diabetes (HCC) Lab Results  Component Value Date   HGBA1C 6.5 09/08/2023   Stable but remains obese, pt for increased mounajro 7.5 mg weekly  Followup: Return if symptoms worsen or fail to improve.  Lynwood Rush, MD 01/01/2024 6:14  PM [facility]

## 2023-12-31 NOTE — Patient Instructions (Signed)
 Ok to finish the antibiotic you have  Ok to increase the Mounjaro  to 7.5 mg weeklyt  Please continue all other medications as before, and refills have been done if requested.  Please have the pharmacy call with any other refills you may need.  Please keep your appointments with your specialists as you may have planned - urology  Please go to the LAB at the blood drawing area for the tests to be done - just the urine testing today  You will be contacted by phone if any changes need to be made immediately.  Otherwise, you will receive a letter about your results with an explanation, but please check with MyChart first.

## 2024-01-01 ENCOUNTER — Encounter: Payer: Self-pay | Admitting: Internal Medicine

## 2024-01-01 NOTE — Assessment & Plan Note (Signed)
 Lab Results  Component Value Date   HGBA1C 6.5 09/08/2023   Stable but remains obese, pt for increased mounajro 7.5 mg weekly

## 2024-01-01 NOTE — Assessment & Plan Note (Signed)
 Also consider OAB med trial if not improved

## 2024-01-01 NOTE — Assessment & Plan Note (Signed)
 Pt now with high suspicion for uti not sensitive it seems to septra .   For ua and culture, change to cipro  500 bid x 10 d

## 2024-01-01 NOTE — Assessment & Plan Note (Signed)
 Last vitamin D Lab Results  Component Value Date   VD25OH 27.84 (L) 09/08/2023   Low, to start oral replacement

## 2024-01-02 LAB — URINE CULTURE

## 2024-01-06 ENCOUNTER — Other Ambulatory Visit: Payer: Self-pay | Admitting: Internal Medicine

## 2024-01-18 ENCOUNTER — Other Ambulatory Visit (HOSPITAL_COMMUNITY): Payer: Self-pay | Admitting: Cardiology

## 2024-01-18 DIAGNOSIS — I272 Pulmonary hypertension, unspecified: Secondary | ICD-10-CM

## 2024-01-24 ENCOUNTER — Ambulatory Visit (HOSPITAL_COMMUNITY)
Admission: RE | Admit: 2024-01-24 | Discharge: 2024-01-24 | Disposition: A | Discharge status: Home / Self Care | Source: Ambulatory Visit | Attending: Cardiology | Admitting: Cardiology

## 2024-01-24 DIAGNOSIS — I6523 Occlusion and stenosis of bilateral carotid arteries: Secondary | ICD-10-CM | POA: Insufficient documentation

## 2024-01-24 DIAGNOSIS — I2510 Atherosclerotic heart disease of native coronary artery without angina pectoris: Secondary | ICD-10-CM | POA: Insufficient documentation

## 2024-01-27 ENCOUNTER — Telehealth: Payer: Self-pay | Admitting: Internal Medicine

## 2024-01-27 NOTE — Telephone Encounter (Unsigned)
 Copied from CRM [id number]. Topic: Clinical - Medication Question >> Jan 27, 2024 12:45 PM Chasity T wrote: Reason for CRM: Patient is calling in to request the medication tirzepatide  (MOUNJARO ) 7.5 MG/0.5ML Pen be boosted up for refill. Please contact him back for further update on increasing dosage

## 2024-01-28 MED ORDER — TIRZEPATIDE 10 MG/0.5ML ~~LOC~~ SOAJ
10.0000 mg | SUBCUTANEOUS | 3 refills | Status: DC
Start: 2024-01-28 — End: 2024-04-14

## 2024-01-28 NOTE — Telephone Encounter (Signed)
 Ok this is done

## 2024-01-28 NOTE — Telephone Encounter (Signed)
 Called and informed patient of increased dosage. Patient will follow up with a month

## 2024-03-10 ENCOUNTER — Ambulatory Visit: Payer: Self-pay | Admitting: Internal Medicine

## 2024-03-10 ENCOUNTER — Ambulatory Visit: Admitting: Internal Medicine

## 2024-03-10 ENCOUNTER — Encounter: Payer: Self-pay | Admitting: Internal Medicine

## 2024-03-10 VITALS — BP 126/78 | HR 93 | Temp 97.8°F | Ht 69.0 in | Wt 216.4 lb

## 2024-03-10 DIAGNOSIS — E1165 Type 2 diabetes mellitus with hyperglycemia: Secondary | ICD-10-CM

## 2024-03-10 DIAGNOSIS — Z23 Encounter for immunization: Secondary | ICD-10-CM

## 2024-03-10 DIAGNOSIS — E7800 Pure hypercholesterolemia, unspecified: Secondary | ICD-10-CM | POA: Diagnosis not present

## 2024-03-10 DIAGNOSIS — I10 Essential (primary) hypertension: Secondary | ICD-10-CM | POA: Diagnosis not present

## 2024-03-10 DIAGNOSIS — N1831 Chronic kidney disease, stage 3a: Secondary | ICD-10-CM

## 2024-03-10 DIAGNOSIS — E559 Vitamin D deficiency, unspecified: Secondary | ICD-10-CM | POA: Diagnosis not present

## 2024-03-10 DIAGNOSIS — E1122 Type 2 diabetes mellitus with diabetic chronic kidney disease: Secondary | ICD-10-CM

## 2024-03-10 LAB — CBC WITH DIFFERENTIAL/PLATELET
Basophils Absolute: 0 K/uL (ref 0.0–0.1)
Basophils Relative: 0.7 % (ref 0.0–3.0)
Eosinophils Absolute: 0.2 K/uL (ref 0.0–0.7)
Eosinophils Relative: 3.7 % (ref 0.0–5.0)
HCT: 39.8 % (ref 39.0–52.0)
Hemoglobin: 13.1 g/dL (ref 13.0–17.0)
Lymphocytes Relative: 27.9 % (ref 12.0–46.0)
Lymphs Abs: 1.1 K/uL (ref 0.7–4.0)
MCHC: 33 g/dL (ref 30.0–36.0)
MCV: 91.3 fl (ref 78.0–100.0)
Monocytes Absolute: 0.4 K/uL (ref 0.1–1.0)
Monocytes Relative: 9 % (ref 3.0–12.0)
Neutro Abs: 2.4 K/uL (ref 1.4–7.7)
Neutrophils Relative %: 58.7 % (ref 43.0–77.0)
Platelets: 157 K/uL (ref 150.0–400.0)
RBC: 4.36 Mil/uL (ref 4.22–5.81)
RDW: 15.1 % (ref 11.5–15.5)
WBC: 4.1 K/uL (ref 4.0–10.5)

## 2024-03-10 LAB — BASIC METABOLIC PANEL WITH GFR
BUN: 13 mg/dL (ref 6–23)
CO2: 28 meq/L (ref 19–32)
Calcium: 9.7 mg/dL (ref 8.4–10.5)
Chloride: 106 meq/L (ref 96–112)
Creatinine, Ser: 0.94 mg/dL (ref 0.40–1.50)
GFR: 79.15 mL/min (ref >60.00)
Glucose, Bld: 77 mg/dL (ref 70–99)
Potassium: 4.3 meq/L (ref 3.5–5.1)
Sodium: 141 meq/L (ref 135–145)

## 2024-03-10 LAB — VITAMIN D 25 HYDROXY (VIT D DEFICIENCY, FRACTURES): VITD: 34.35 ng/mL (ref 30.00–100.00)

## 2024-03-10 LAB — HEPATIC FUNCTION PANEL
ALT: 38 U/L (ref 0–53)
AST: 33 U/L (ref 0–37)
Albumin: 4.4 g/dL (ref 3.5–5.2)
Alkaline Phosphatase: 66 U/L (ref 39–117)
Bilirubin, Direct: 0.2 mg/dL (ref 0.0–0.3)
Total Bilirubin: 0.7 mg/dL (ref 0.2–1.2)
Total Protein: 7.9 g/dL (ref 6.0–8.3)

## 2024-03-10 LAB — HEMOGLOBIN A1C: Hgb A1c MFr Bld: 6.1 % (ref 4.6–6.5)

## 2024-03-10 LAB — LIPID PANEL
Cholesterol: 141 mg/dL (ref 0–200)
HDL: 60.1 mg/dL (ref >39.00)
LDL Cholesterol: 68 mg/dL (ref 0–99)
NonHDL: 80.86
Total CHOL/HDL Ratio: 2
Triglycerides: 63 mg/dL (ref 0.0–149.0)
VLDL: 12.6 mg/dL (ref 0.0–40.0)

## 2024-03-10 MED ORDER — METOPROLOL TARTRATE 50 MG PO TABS: 50.0000 mg | ORAL_TABLET | Freq: Two times a day (BID) | ORAL | 3 refills | Status: AC

## 2024-03-10 MED ORDER — DICLOFENAC SODIUM ER 100 MG PO TB24: 100.0000 mg | ORAL_TABLET | Freq: Every day | ORAL | 1 refills | Status: AC

## 2024-03-10 MED ORDER — OMEPRAZOLE 20 MG PO CPDR: DELAYED_RELEASE_CAPSULE | ORAL | 3 refills | Status: DC

## 2024-03-10 NOTE — Patient Instructions (Addendum)
 You had the flu shot today  Please continue all other medications as before, and refills have been done if requested.  Please have the pharmacy call with any other refills you may need.  Please continue your efforts at being more active, low cholesterol diet, and weight control.  You are otherwise up to date with prevention measures today.  Please keep your appointments with your specialists as you may have planned  You will be contacted regarding the referral for: eye doctor  Please go to the LAB at the blood drawing area for the tests to be done  You will be contacted by phone if any changes need to be made immediately.  Otherwise, you will receive a letter about your results with an explanation, but please check with MyChart first.  Please make an Appointment to return in 6 months, or sooner if needed

## 2024-03-10 NOTE — Progress Notes (Signed)
 Patient ID: Warren Weeks, male   DOB: 02/05/1946, 78 y.o.   MRN: [id number]        Chief Complaint: follow up HTN, HLD, DM with ckd3a, low vit d,        HPI:  Warren Weeks is a 78 y.o. male here overall doing ok, Pt denies chest pain, increased sob or doe, wheezing, orthopnea, PND, increased LE swelling, palpitations, dizziness or syncope.   Pt denies polydipsia, polyuria, or new focal neuro s/s.   Pt denies fever, wt loss, night sweats, loss of appetite, or other constitutional symptoms   Lost 35 lbs in 6 mo with moujaro.  Due for flu shot, Due for eye exam referral as last optho has retired.  Wt Readings from Last 3 Encounters:  03/10/24 216 lb 6.4 oz (98.2 kg)  12/31/23 228 lb (103.4 kg)  11/05/23 241 lb 3.2 oz (109.4 kg)   BP Readings from Last 3 Encounters:  03/10/24 126/78  12/31/23 116/60  12/25/23 122/70         Past Medical History:  Diagnosis Date   ALLERGIC RHINITIS 09/16/2007   ALLERGY 03/24/2007   Allergy    ANXIETY 03/28/2007   Arthritis    knees, back,shoulder   CAROTID ARTERY STENOSIS, BILATERAL 07/01/2009   yearly checks - no current problems   Cataract    bilateral,removed   CEPHALGIA 06/08/2009   CHEST PAIN 09/16/2007   no current problems   COLONIC POLYPS, HX OF 09/16/2007   COPD (chronic obstructive pulmonary disease) (HCC) 05/12/2017   mild per patient - no inhaler   Dizziness and giddiness 06/08/2009   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   DYSPNEA/SHORTNESS OF BREATH 09/16/2007   ERECTILE DYSFUNCTION 03/28/2007   ESOPHAGEAL STRICTURE 09/16/2007   ESOPHAGITIS 03/24/2007   GERD 03/24/2007   GLUCOSE INTOLERANCE 09/16/2007   HEMORRHOIDS, INTERNAL 03/24/2007   HYPERCHOLESTEROLEMIA 03/24/2007   HYPERLIPIDEMIA 09/16/2007   HYPERTENSION 03/28/2007   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/2011   Impaired glucose tolerance 10/04/2010   LOW BACK PAIN 03/28/2007   MAGNETIC RESONANCE IMAGING, BRAIN, ABNORMAL 06/26/2009   NECK PAIN, LEFT 06/20/2010   OBESITY 03/24/2007    OTITIS MEDIA, LEFT 06/10/2009   Palpitations 06/20/2010   no current problems   PVD 06/25/2009   RASH-NONVESICULAR 06/17/2009   Sleep apnea 12/25/2021   URI 08/16/2009   Past Surgical History:  Procedure Laterality Date   COLONOSCOPY  03/2013   polyps/[place]   LEFT HEART CATH AND CORONARY ANGIOGRAPHY N/A 10/14/2021   Procedure: LEFT HEART CATH AND CORONARY ANGIOGRAPHY;  Surgeon: Dann Candyce RAMAN, MD;  Location: [facility];  Service: Cardiovascular;  Laterality: N/A;   NASAL SEPTOPLASTY W/ TURBINOPLASTY Bilateral 01/03/2021   Procedure: NASAL SEPTOPLASTY WITH BILATERAL TURBINATE REDUCTION;  Surgeon: Karis Clunes, MD;  Location: [facility];  Service: ENT;  Laterality: Bilateral;   RIGHT HEART CATH N/A 02/04/2023   Procedure: RIGHT HEART CATH;  Surgeon: Rolan Ezra RAMAN, MD;  Location: [facility] INVASIVE CV LAB;  Service: Cardiovascular;  Laterality: N/A;   s/p right knee arthroscopy     UPPER GASTROINTESTINAL ENDOSCOPY     gerd    reports that he quit smoking about 32 years ago. His smoking use included cigarettes. He started smoking about 47 years ago. He has a 7.5 pack-year smoking history. He has never used smokeless tobacco. He reports that he does not currently use alcohol after a past usage of about 7.0 standard drinks of alcohol per week. He reports that he does not use drugs.  family history includes Coronary artery disease in his brother and sister; Heart disease in his father; Lung cancer in his sister; Lymphoma in his mother; Ovarian cancer in his sister. Allergies  Allergen Reactions   Cefuroxime Axetil Rash   Current Outpatient Medications on File Prior to Visit  Medication Sig Dispense Refill   acetaminophen  (TYLENOL ) 650 MG CR tablet Take 650 mg by mouth at bedtime.     aspirin  81 MG EC tablet Take 1 tablet (81 mg total) by mouth daily. Swallow whole. 30 tablet 12   Azelastine  HCl 137 MCG/SPRAY SOLN SMARTSIG:1-2 Spray(s) Both Nares Twice Daily PRN      Cyanocobalamin  (B-12 PO) Take 2,500 mcg by mouth daily.     diclofenac  Sodium (VOLTAREN ) 1 % GEL Apply 2 g topically daily.     ezetimibe  (ZETIA ) 10 MG tablet Take 1 tablet by mouth once daily 90 tablet 3   fluticasone  (FLONASE ) 50 MCG/ACT nasal spray Place 1 spray into both nostrils in the morning and at bedtime. USE 2 SPRAY(S) IN EACH NOSTRIL ONCE DAILY 16 g 3   fluticasone -salmeterol (ADVAIR) 250-50 MCG/ACT AEPB INHALE 1 DOSE BY MOUTH IN THE MORNING AND IN THE EVENING 60 each 11   furosemide  (LASIX ) 40 MG tablet Take 1 tablet by mouth once daily 90 tablet 2   lisinopril  (ZESTRIL ) 20 MG tablet Take 1 tablet by mouth once daily 90 tablet 3   Magnesium 250 MG TABS Take 250 mg by mouth daily.     Melatonin 10 MG TABS Take 10 mg by mouth at bedtime.      methocarbamol  (ROBAXIN ) 500 MG tablet Take 1 tablet (500 mg total) by mouth 2 (two) times daily as needed. 60 tablet 2   Multiple Vitamin (ONE-A-DAY 55 PLUS PO) Take 1 tablet by mouth daily. Men     Omega-3 Fatty Acids (FISH OIL) 1000 MG CAPS Take 1,000 mg by mouth daily.     potassium chloride  SA (KLOR-CON  M) 20 MEQ tablet Take 1 tablet (20 mEq total) by mouth daily. 60 tablet 3   rosuvastatin  (CRESTOR ) 40 MG tablet Take 1 tablet by mouth once daily 90 tablet 0   sildenafil  (VIAGRA ) 100 MG tablet Take 1 tablet (100 mg total) by mouth as needed for erectile dysfunction. 10 tablet 11   solifenacin  (VESICARE ) 5 MG tablet Take 1 tablet (5 mg total) by mouth daily. 90 tablet 3   terazosin  (HYTRIN ) 5 MG capsule Take 5 mg by mouth at bedtime.      Testosterone  20.25 MG/1.25GM (1.62%) GEL Apply 2 Packages topically daily. On each shoulder     thiamine  250 MG tablet Take 250 mg by mouth daily.     tirzepatide  (MOUNJARO ) 10 MG/0.5ML Pen Inject 10 mg into the skin once a week. 6 mL 3   Turmeric 500 MG TABS Take 1,000 mg by mouth daily. ginger     No current facility-administered medications on file prior to visit.        ROS:  All others reviewed and  negative.  Objective        PE:  BP 126/78   Pulse 93   Temp 97.8 F (36.6 C)   Ht 5' 9 (1.753 m)   Wt 216 lb 6.4 oz (98.2 kg)   SpO2 98%   BMI 31.96 kg/m                 Constitutional: Pt appears in NAD  HENT: Head: NCAT.                Right Ear: External ear normal.                 Left Ear: External ear normal.                Eyes: . Pupils are equal, round, and reactive to light. Conjunctivae and EOM are normal               Nose: without d/c or deformity               Neck: Neck supple. Gross normal ROM               Cardiovascular: Normal rate and regular rhythm.                 Pulmonary/Chest: Effort normal and breath sounds without rales or wheezing.                Abd:  Soft, NT, ND, + BS, no organomegaly               Neurological: Pt is alert. At baseline orientation, motor grossly intact               Skin: Skin is warm. No rashes, no other new lesions, LE edema - trace pedal bilateral               Psychiatric: Pt behavior is normal without agitation   Micro: none  Cardiac tracings I have personally interpreted today:  none  Pertinent Radiological findings (summarize): none   Lab Results  Component Value Date   WBC 4.1 03/10/2024   HGB 13.1 03/10/2024   HCT 39.8 03/10/2024   PLT 157.0 03/10/2024   GLUCOSE 77 03/10/2024   CHOL 141 03/10/2024   TRIG 63.0 03/10/2024   HDL 60.10 03/10/2024   LDLDIRECT 176.0 04/03/2011   LDLCALC 68 03/10/2024   ALT 38 03/10/2024   AST 33 03/10/2024   NA 141 03/10/2024   K 4.3 03/10/2024   CL 106 03/10/2024   CREATININE 0.94 03/10/2024   BUN 13 03/10/2024   CO2 28 03/10/2024   TSH 3.11 09/08/2023   PSA 0.21 09/08/2023   HGBA1C 6.1 03/10/2024   MICROALBUR <0.7 09/08/2023   Assessment/Plan:  Warren Weeks is a 78 y.o. Black or African American [2] male with  has a past medical history of ALLERGIC RHINITIS (09/16/2007), ALLERGY (03/24/2007), Allergy, ANXIETY (03/28/2007), Arthritis, CAROTID ARTERY  STENOSIS, BILATERAL (07/01/2009), Cataract, CEPHALGIA (06/08/2009), CHEST PAIN (09/16/2007), COLONIC POLYPS, HX OF (09/16/2007), COPD (chronic obstructive pulmonary disease) (HCC) (05/12/2017), Dizziness and giddiness (06/08/2009), Dyshidrotic eczema (06/03/2018), DYSPNEA/SHORTNESS OF BREATH (09/16/2007), ERECTILE DYSFUNCTION (03/28/2007), ESOPHAGEAL STRICTURE (09/16/2007), ESOPHAGITIS (03/24/2007), GERD (03/24/2007), GLUCOSE INTOLERANCE (09/16/2007), HEMORRHOIDS, INTERNAL (03/24/2007), HYPERCHOLESTEROLEMIA (03/24/2007), HYPERLIPIDEMIA (09/16/2007), HYPERTENSION (03/28/2007), Hypogonadism male (10/09/2011), Impaired glucose tolerance (10/04/2010), LOW BACK PAIN (03/28/2007), MAGNETIC RESONANCE IMAGING, BRAIN, ABNORMAL (06/26/2009), NECK PAIN, LEFT (06/20/2010), OBESITY (03/24/2007), OTITIS MEDIA, LEFT (06/10/2009), Palpitations (06/20/2010), PVD (06/25/2009), RASH-NONVESICULAR (06/17/2009), Sleep apnea (12/25/2021), and URI (08/16/2009).  Vitamin D  deficiency Last vitamin D  Lab Results  Component Value Date   VD25OH 34.35 03/10/2024   Low, to start oral replacement   Hyperlipidemia Lab Results  Component Value Date   LDLCALC 68 03/10/2024   Stable, pt to continue current statin crestor  40 mg qd   Essential hypertension BP Readings from Last 3 Encounters:  03/10/24 126/78  12/31/23 116/60  12/25/23 122/70   Stable, pt  to continue medical treatment lopressor  50 bid, lisnopril 20 qd   Diabetes mellitus with stage 3 chronic kidney disease, without long-term current use of insulin (HCC) Lab Results  Component Value Date   HGBA1C 6.1 03/10/2024   Lab Results  Component Value Date   CREATININE 0.94 03/10/2024   Stable overall, cont to avoid nephrotoxins; also for referral Dr Octavia or [place] [facility]  Followup: Return in about 6 months (around 09/07/2024).  Lynwood Rush, MD 03/11/2024 5:00 PM [facility]

## 2024-03-10 NOTE — Progress Notes (Signed)
 The test results show that your current treatment is OK, as the tests are stable.  Please continue the same plan.  There is no other need for change of treatment or further evaluation based on these results, at this time.  thanks

## 2024-03-11 ENCOUNTER — Encounter: Payer: Self-pay | Admitting: Internal Medicine

## 2024-03-11 NOTE — Assessment & Plan Note (Signed)
 Lab Results  Component Value Date   HGBA1C 6.1 03/10/2024   Lab Results  Component Value Date   CREATININE 0.94 03/10/2024   Stable overall, cont to avoid nephrotoxins; also for referral Dr Octavia or [place] [facility]

## 2024-03-11 NOTE — Assessment & Plan Note (Signed)
 BP Readings from Last 3 Encounters:  03/10/24 126/78  12/31/23 116/60  12/25/23 122/70   Stable, pt to continue medical treatment lopressor  50 bid, lisnopril 20 qd

## 2024-03-11 NOTE — Assessment & Plan Note (Signed)
 Lab Results  Component Value Date   LDLCALC 68 03/10/2024   Stable, pt to continue current statin crestor  40 mg qd

## 2024-03-11 NOTE — Assessment & Plan Note (Signed)
 Last vitamin D  Lab Results  Component Value Date   VD25OH 34.35 03/10/2024   Low, to start oral replacement

## 2024-03-20 ENCOUNTER — Ambulatory Visit (INDEPENDENT_AMBULATORY_CARE_PROVIDER_SITE_OTHER): Admitting: Otolaryngology

## 2024-03-20 ENCOUNTER — Telehealth (INDEPENDENT_AMBULATORY_CARE_PROVIDER_SITE_OTHER): Payer: Self-pay | Admitting: Otolaryngology

## 2024-03-20 ENCOUNTER — Encounter (INDEPENDENT_AMBULATORY_CARE_PROVIDER_SITE_OTHER): Payer: Self-pay | Admitting: Otolaryngology

## 2024-03-20 VITALS — BP 115/73 | HR 78 | Ht 69.0 in | Wt 216.0 lb

## 2024-03-20 DIAGNOSIS — R0982 Postnasal drip: Secondary | ICD-10-CM

## 2024-03-20 DIAGNOSIS — K219 Gastro-esophageal reflux disease without esophagitis: Secondary | ICD-10-CM | POA: Diagnosis not present

## 2024-03-20 DIAGNOSIS — J383 Other diseases of vocal cords: Secondary | ICD-10-CM | POA: Diagnosis not present

## 2024-03-20 DIAGNOSIS — R0981 Nasal congestion: Secondary | ICD-10-CM | POA: Diagnosis not present

## 2024-03-20 DIAGNOSIS — R490 Dysphonia: Secondary | ICD-10-CM

## 2024-03-20 MED ORDER — FLUTICASONE PROPIONATE 50 MCG/ACT NA SUSP: 2.0000 | Freq: Two times a day (BID) | NASAL | 12 refills | Status: AC

## 2024-03-20 MED ORDER — AZELASTINE HCL 0.1 % NA SOLN: 2.0000 | Freq: Two times a day (BID) | NASAL | 12 refills | Status: AC

## 2024-03-20 NOTE — Patient Instructions (Addendum)
 [email or web address]://[email or web address].nhs.uk/media/90fvclpqw/[email or web address]-vocal-[email or web address]-using-humming-exercises-[email or web address]-trills_[email or web address].pdf [email or web address].net/  Aurel[facility] Med Nasal Saline Rinse  - start nasal saline rinses with [employer]Med Bottle available over the counter    Nasal Saline Irrigation instructions: If you choose to make your own salt water solution, You will need: Salt (kosher, canning, or pickling salt) Baking soda Nasal irrigation bottle (i.e. Aurel[facility] Med Sinus Rinse) Measuring spoon ( teaspoon) Distilled / boiled water   Mix solution Mix 1 teaspoon of salt, 1/2 teaspoon of baking soda and 1 cup of water into irrigation bottle ** May use saline packet instead of homemade recipe for this step if you prefer If medicine was prescribed to be mixed with solution, place this into bottle Examples 2 inches of 2% mupirocin  ointment Budesonide solution Position your head: Lean over sink (about 45 degrees) Rotate head (about 45 degrees) so that one nostril is above the other Irrigate Insert tip of irrigation bottle into upper nostril so it forms a comfortable seal Irrigate while breathing through your mouth May remove the straw from the bottle in order to irrigate the entire solution (important if medicine was added) Exhale through nose when finished and blow nose as necessary  Repeat on opposite side with other 1/2 of solution (120 mL) or remake solution if all 240 mL was used on first side Wash irrigation bottle regularly, replace every 3 months

## 2024-03-20 NOTE — Telephone Encounter (Signed)
 Informed patient. Patient verbalizes understanding.

## 2024-03-20 NOTE — Telephone Encounter (Signed)
 At check out, the patient wanted to let Dr Tobie know that he is a mouth-breather and does use a cpap machine and wondered if this contributes to his current issues.   Thanks

## 2024-03-20 NOTE — Progress Notes (Signed)
 Dear Dr. Norleen, Here is my assessment for our mutual patient, Warren Weeks. Thank you for allowing me the opportunity to care for your patient. Please do not hesitate to contact me should you have any other questions. Sincerely, Dr. Eldora Blanch  Otolaryngology Clinic Note Referring provider: Dr. Norleen HPI:  Warren Weeks is a 78 y.o. male kindly referred by Dr. Norleen for evaluation of dysphonia  Initial visit (02/2024):Warren Weeks  He reports that he has had persistent hoarseness for over a year. Denies any antecedent event including intubation, URI, trauma. He does use an inhaler and CPAP. He thinks the voice feels like it is getting worse, but slowly. Don't sound like I used to. He does feel like runs of out his voice. He has seen Dr. Llewellyn for this as well many months ago. Tried speech therapy, which he says helped mildly. Talking makes his voice worse.He drinks about 40oz/day of water, sometimes more. No neurological conditions. He does report reflux symptoms such as belching, but no epigastric discomfort -- for which he takes omeprazole  BID, which has helped.  Retired -- does feel like voice is not adequate.  Patient otherwise denies: - dysphagia, odynophagia, aspiration episodes or PNA, unintentional weight loss - worsening shortness of breath, hemoptysis - ear pain, neck masses  Does have a history of nasal congestion for which he uses flonase  and astelin  but needs a refill.  ENT Surgery: Septoplasty/Turbinate Reduction [place]) Personal or FHx of bleeding dz or anesthesia difficulty: no  GLP-1: yes AP/AC: ASA 81  Tobacco: 10 pack year  PMHx: OSA, Pulm HTN, HTN, Carotid Artery stenosis, Cervical Spine Pain, HLD  Independent Review of Additional Tests or Records:  Dr. Llewellyn (04/02/2023): Noted hoarseness for 7-8 months, worsening; no other triggers; noted weight loss; no tobacco use. Prior GI visit as well; used omeprazole  for reflux, also coughing in morning; TFL consistent with LPR  and presbylarynx; Rx: management of reflux; astelin  CBC and BMP 03/10/2024: WBC 4.1, Eos 200; BUN/Cr 13/0.94 Dr. Annella (09/30/2023): noted hoarseness present for a while, seen by ENT, sent to speech; possible reflux, PND also discussed. Dx: AR, add flonase  to astelin  SLP Notes 06/08/2023: noted hoarseness, worsened; improved after therapy, discharged PMH/Meds/All/SocHx/FamHx/ROS:   Past Medical History:  Diagnosis Date   ALLERGIC RHINITIS 09/16/2007   ALLERGY 03/24/2007   Allergy    ANXIETY 03/28/2007   Arthritis    knees, back,shoulder   CAROTID ARTERY STENOSIS, BILATERAL 07/01/2009   yearly checks - no current problems   Cataract    bilateral,removed   CEPHALGIA 06/08/2009   CHEST PAIN 09/16/2007   no current problems   COLONIC POLYPS, HX OF 09/16/2007   COPD (chronic obstructive pulmonary disease) (HCC) 05/12/2017   mild per patient - no inhaler   Dizziness and giddiness 06/08/2009   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   DYSPNEA/SHORTNESS OF BREATH 09/16/2007   ERECTILE DYSFUNCTION 03/28/2007   ESOPHAGEAL STRICTURE 09/16/2007   ESOPHAGITIS 03/24/2007   GERD 03/24/2007   GLUCOSE INTOLERANCE 09/16/2007   HEMORRHOIDS, INTERNAL 03/24/2007   HYPERCHOLESTEROLEMIA 03/24/2007   HYPERLIPIDEMIA 09/16/2007   HYPERTENSION 03/28/2007   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/2011   Impaired glucose tolerance 10/04/2010   LOW BACK PAIN 03/28/2007   MAGNETIC RESONANCE IMAGING, BRAIN, ABNORMAL 06/26/2009   NECK PAIN, LEFT 06/20/2010   OBESITY 03/24/2007   OTITIS MEDIA, LEFT 06/10/2009   Palpitations 06/20/2010   no current problems   PVD 06/25/2009   RASH-NONVESICULAR 06/17/2009   Sleep apnea 12/25/2021   URI 08/16/2009     Past Surgical  History:  Procedure Laterality Date   COLONOSCOPY  03/2013   polyps/[place]   LEFT HEART CATH AND CORONARY ANGIOGRAPHY N/A 10/14/2021   Procedure: LEFT HEART CATH AND CORONARY ANGIOGRAPHY;  Surgeon: Dann Candyce RAMAN, MD;  Location: [facility] INVASIVE CV LAB;   Service: Cardiovascular;  Laterality: N/A;   NASAL SEPTOPLASTY W/ TURBINOPLASTY Bilateral 01/03/2021   Procedure: NASAL SEPTOPLASTY WITH BILATERAL TURBINATE REDUCTION;  Surgeon: Karis Clunes, MD;  Location: [facility];  Service: ENT;  Laterality: Bilateral;   RIGHT HEART CATH N/A 02/04/2023   Procedure: RIGHT HEART CATH;  Surgeon: Rolan Ezra RAMAN, MD;  Location: [facility] INVASIVE CV LAB;  Service: Cardiovascular;  Laterality: N/A;   s/p right knee arthroscopy     UPPER GASTROINTESTINAL ENDOSCOPY     gerd    Family History  Problem Relation Age of Onset   Lymphoma Mother    Heart disease Father        CHF   Ovarian cancer Sister    Lung cancer Sister    Coronary artery disease Sister        CABG   Coronary artery disease Brother        stent, and CAD   Colon cancer Neg Hx    Esophageal cancer Neg Hx    Rectal cancer Neg Hx    Stomach cancer Neg Hx    Colon polyps Neg Hx    Crohn's disease Neg Hx    Ulcerative colitis Neg Hx      Social Connections: Unknown (08/09/2023)   Social Connection and Isolation Panel    Frequency of Communication with Friends and Family: Not on file    Frequency of Social Gatherings with Friends and Family: Twice a week    Attends Religious Services: Never    Database administrator or Organizations: No    Attends Engineer, structural: Never    Marital Status: Married  Recent Concern: Social Connections - Moderately Isolated (08/09/2023)   Social Connection and Isolation Panel    Frequency of Communication with Friends and Family: More than three times a week    Frequency of Social Gatherings with Friends and Family: Twice a week    Attends Religious Services: Never    Database administrator or Organizations: No    Attends Banker Meetings: Never    Marital Status: Married      Current Outpatient Medications:    acetaminophen  (TYLENOL ) 650 MG CR tablet, Take 650 mg by mouth at bedtime., Disp: , Rfl:    aspirin  81 MG EC  tablet, Take 1 tablet (81 mg total) by mouth daily. Swallow whole., Disp: 30 tablet, Rfl: 12   azelastine  (ASTELIN ) 0.1 % nasal spray, Place 2 sprays into both nostrils 2 (two) times daily. Use in each nostril as directed, Disp: 30 mL, Rfl: 12   Azelastine  HCl 137 MCG/SPRAY SOLN, SMARTSIG:1-2 Spray(s) Both Nares Twice Daily PRN, Disp: , Rfl:    Cyanocobalamin  (B-12 PO), Take 2,500 mcg by mouth daily., Disp: , Rfl:    diclofenac  Sodium (VOLTAREN ) 1 % GEL, Apply 2 g topically daily., Disp: , Rfl:    Diclofenac  Sodium CR 100 MG 24 hr tablet, Take 1 tablet (100 mg total) by mouth daily., Disp: 90 tablet, Rfl: 1   ezetimibe  (ZETIA ) 10 MG tablet, Take 1 tablet by mouth once daily, Disp: 90 tablet, Rfl: 3   fluticasone -salmeterol (ADVAIR) 250-50 MCG/ACT AEPB, INHALE 1 DOSE BY MOUTH IN THE MORNING AND IN THE EVENING, Disp:  60 each, Rfl: 11   furosemide  (LASIX ) 40 MG tablet, Take 1 tablet by mouth once daily, Disp: 90 tablet, Rfl: 2   lisinopril  (ZESTRIL ) 20 MG tablet, Take 1 tablet by mouth once daily, Disp: 90 tablet, Rfl: 3   Magnesium 250 MG TABS, Take 250 mg by mouth daily., Disp: , Rfl:    Melatonin 10 MG TABS, Take 10 mg by mouth at bedtime. , Disp: , Rfl:    methocarbamol  (ROBAXIN ) 500 MG tablet, Take 1 tablet (500 mg total) by mouth 2 (two) times daily as needed., Disp: 60 tablet, Rfl: 2   metoprolol  tartrate (LOPRESSOR ) 50 MG tablet, Take 1 tablet (50 mg total) by mouth 2 (two) times daily., Disp: 180 tablet, Rfl: 3   Multiple Vitamin (ONE-A-DAY 55 PLUS PO), Take 1 tablet by mouth daily. Men, Disp: , Rfl:    Omega-3 Fatty Acids (FISH OIL) 1000 MG CAPS, Take 1,000 mg by mouth daily., Disp: , Rfl:    omeprazole  (PRILOSEC) 20 MG capsule, Take 1 capsule by mouth twice daily, Disp: 180 capsule, Rfl: 3   potassium chloride  SA (KLOR-CON  M) 20 MEQ tablet, Take 1 tablet (20 mEq total) by mouth daily., Disp: 60 tablet, Rfl: 3   rosuvastatin  (CRESTOR ) 40 MG tablet, Take 1 tablet by mouth once daily, Disp: 90  tablet, Rfl: 0   sildenafil  (VIAGRA ) 100 MG tablet, Take 1 tablet (100 mg total) by mouth as needed for erectile dysfunction., Disp: 10 tablet, Rfl: 11   solifenacin  (VESICARE ) 5 MG tablet, Take 1 tablet (5 mg total) by mouth daily., Disp: 90 tablet, Rfl: 3   terazosin  (HYTRIN ) 5 MG capsule, Take 5 mg by mouth at bedtime. , Disp: , Rfl:    Testosterone  20.25 MG/1.25GM (1.62%) GEL, Apply 2 Packages topically daily. On each shoulder, Disp: , Rfl:    thiamine  250 MG tablet, Take 250 mg by mouth daily., Disp: , Rfl:    tirzepatide  (MOUNJARO ) 10 MG/0.5ML Pen, Inject 10 mg into the skin once a week., Disp: 6 mL, Rfl: 3   Turmeric 500 MG TABS, Take 1,000 mg by mouth daily. ginger, Disp: , Rfl:    fluticasone  (FLONASE ) 50 MCG/ACT nasal spray, Place 2 sprays into both nostrils in the morning and at bedtime. USE 2 SPRAY(S) IN EACH NOSTRIL ONCE DAILY, Disp: 16 g, Rfl: 12   Physical Exam:   BP 115/73 (BP Location: Left Arm, Patient Position: Sitting, Cuff Size: Large)   Pulse 78   Ht 5' 9 (1.753 m)   Wt 216 lb (98 kg)   SpO2 95%   BMI 31.90 kg/m   Salient findings:  CN II-XII intact Bilateral EAC clear and TM intact with well pneumatized middle ear spaces Anterior rhinoscopy: Septum intact; bilateral inferior turbinates without significant hypertrophy No lesions of oral cavity/oropharynx No obviously palpable neck masses/lymphadenopathy/thyromegaly No respiratory distress or stridor; voice quality class 2.5, modest harsh dysphonia with some variation; TFL was indicated to better evaluate the proximal airway, given the patient's history and exam findings, and is detailed below.  Seprately Identifiable Procedures:  Prior to initiating any procedures, risks/benefits/alternatives were explained to the patient and verbal consent obtained. Procedure Note Pre-procedure diagnosis:  Dysphonia, GERD, LPR Post-procedure diagnosis: Same Procedure: Transnasal Fiberoptic Laryngoscopy, CPT 31575 - Mod  25 Indication: see above Complications: None apparent EBL: 0 mL  The procedure was undertaken to further evaluate the patient's complaint above, with mirror exam inadequate for appropriate examination due to gag reflex and poor patient tolerance  Procedure:  Patient was identified as correct patient. Verbal consent was obtained. The nose was sprayed with oxymetazoline  and 4% lidocaine . The The flexible laryngoscope was passed through the nose to view the nasal cavity, pharynx (oropharynx, hypopharynx) and larynx.  The larynx was examined at rest and during multiple phonatory tasks. Documentation was obtained and reviewed with patient. The scope was removed. The patient tolerated the procedure well.  Findings: The nasal cavity and nasopharynx did not reveal any masses or lesions, mucosa appeared to be without obvious lesions. The tongue base, pharyngeal walls, piriform sinuses, vallecula, epiglottis and postcricoid region are normal in appearance - mild post-cricoid redundant mucosa. The visualized portion of the subglottis and proximal trachea is widely patent. The vocal folds are mobile bilaterally. There are no lesions on the free edge of the vocal folds nor elsewhere in the larynx worrisome for malignancy. Of note, he does have modest AP compression of VF consistent with muscle tension dysphonia. Bilateral vocal folds with age appropriate atrophy with likely glottal gap  Electronically signed by: Eldora KATHEE Blanch, MD 03/20/2024 6:53 PM   Impression & Plans:  Warren Weeks is a 78 y.o. male with:  1. Post-nasal drip   2. Dysphonia   3. Vocal fold atrophy   4. Glottic insufficiency   5. Muscle tension dysphonia   6. 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 We discussed options for this including MTD exercises, repeat speech therapy, reflux management and augmentation trial.  Of these, he  would like to avoid augmentation currently but wishes to trial MTD exercises but not speech therapy currently. Trial reflux gourmet.  He also has persistent nasal congestion for which we will start rinses, and I refilled his astelin  and flonase .  D/w pt f/u, and he will call if not improving or wishes to pursue therapy or augmentation  See below regarding exact medications prescribed this encounter including dosages and route: Meds ordered this encounter  Medications   azelastine  (ASTELIN ) 0.1 % nasal spray    Sig: Place 2 sprays into both nostrils 2 (two) times daily. Use in each nostril as directed    Dispense:  30 mL    Refill:  12   fluticasone  (FLONASE ) 50 MCG/ACT nasal spray    Sig: Place 2 sprays into both nostrils in the morning and at bedtime. USE 2 SPRAY(S) IN EACH NOSTRIL ONCE DAILY    Dispense:  16 g    Refill:  12      Thank you for allowing me the opportunity to care for your patient. Please do not hesitate to contact me should you have any other questions.  Sincerely, Eldora Blanch, MD Otolaryngologist (ENT), [facility] Phone: [phone] Fax: [phone]  03/20/2024, 6:53 PM   MDM:  Level [phone] Complexity/Problems addressed: chronic worsening problem Data complexity: mod - independent review of notes, labs - Morbidity: mod  - Drug prescribed or managed: y

## 2024-03-31 ENCOUNTER — Ambulatory Visit: Admitting: Pulmonary Disease

## 2024-03-31 ENCOUNTER — Encounter: Payer: Self-pay | Admitting: Pulmonary Disease

## 2024-03-31 VITALS — BP 136/80 | HR 81 | Temp 97.8°F | Ht 69.0 in | Wt 213.8 lb

## 2024-03-31 DIAGNOSIS — G4733 Obstructive sleep apnea (adult) (pediatric): Secondary | ICD-10-CM | POA: Diagnosis not present

## 2024-03-31 DIAGNOSIS — I2722 Pulmonary hypertension due to left heart disease: Secondary | ICD-10-CM | POA: Diagnosis not present

## 2024-03-31 DIAGNOSIS — J4489 Other specified chronic obstructive pulmonary disease: Secondary | ICD-10-CM | POA: Diagnosis not present

## 2024-03-31 DIAGNOSIS — I519 Heart disease, unspecified: Secondary | ICD-10-CM

## 2024-03-31 DIAGNOSIS — J309 Allergic rhinitis, unspecified: Secondary | ICD-10-CM | POA: Diagnosis not present

## 2024-03-31 DIAGNOSIS — J4540 Moderate persistent asthma, uncomplicated: Secondary | ICD-10-CM

## 2024-03-31 NOTE — Patient Instructions (Signed)
 Like to see you again  Stop your current inhaler Advair or Wixela  We will stop this to see if stopping the inhaled steroid will improve the hoarseness  You Stiolto 2 puffs once a day  If the hoarseness is better and breathing okay send a message I can prescribe this, I provided samples today.  Return to clinic in 6 months or sooner as needed with Dr. Annella

## 2024-03-31 NOTE — Progress Notes (Signed)
 @Patient  ID: Warren Weeks, male    DOB: 04/20/1946, 78 y.o.   MRN: [id number]  No chief complaint on file.   Referring provider: Norleen Lynwood ORN, MD  HPI:   78 y.o. man whom we are seeing for evaluation of pulmonary hypertension and OSA.  Most recent PCP note reviewed. Most recent Cardiology note reviewed.  Most recent ENT note reviewed.  Return for routine follow-up.  Dyspnea shortness of breath okay.  Doing relatively well.  From a breathing standpoint.  Saw ENT again.  Diagnosed with vocal cord atrophy.  His chief complaint is hoarse voice.  This is likely the culprit.  No lesion or significant culprit otherwise.  We discussed this could be related to aging.  But it could be related to inhaled corticosteroid.  Given well-controlled asthma, we discussed trialing off inhaled steroid, substituting for dual bronchodilators, to see if breathing stays stable and voice improves.  He expressed understanding.  HPI at initial visit: Patient presents today to clinic for discussion of pulmonary hypertension.  OSA on CPAP.  Some shortness of breath.  Echocardiogram was obtained 12/2022.  This demonstrated dilated left atrium, MVR, grade 1 diastolic dysfunction, signs of elevated PASP.  He was started on Lasix  given lower extremity edema.  Right heart catheterization was arranged.  This was performed 01/2023 with a mean PA pressure of 25, wedge of 17, preserved cardiac output and index with a PVR of 1.2 Wood units.  He has been adherent to his Lasix .  Weight down about 9 pounds.  He feels like lower extremities are less swollen.  There is still some edema on exam.  Reviewed recent labs with mild increase in creatinine 1.2 from baseline 1-1.1.  He is fatigued throughout the day.  Tired.  Has musculoskeletal issues unable to exercise.  Compliance report for OSA on CPAP reviewed today, excellent adherence and well treated AHI 0.5.  6-minute walk today with 771 m walked desaturate 97% to 95% but no need for oxygen,  normal blood pressure response, normal heart rate response.  We discussed at length the role and rationale for vasodilators.  How it is unlikely to help him.  Discussed etiologies of pulmonary hypertension etc.   Questionaires / Pulmonary Flowsheets:   ACT:      No data to display          MMRC:     No data to display          Epworth:     10/17/2021    8:00 AM  Results of the Epworth flowsheet  Sitting and reading 0  Watching TV 3  Sitting, inactive in a public place (e.g. a theatre or a meeting) 0  As a passenger in a car for an hour without a break 2  Lying down to rest in the afternoon when circumstances permit 0  Sitting and talking to someone 0  Sitting quietly after a lunch without alcohol 3  In a car, while stopped for a few minutes in traffic 0  Total score 8    Tests:   FENO:  No results found for: NITRICOXIDE  PFT:    Latest Ref Rng & Units 11/25/2022    9:58 AM 12/16/2021    9:03 AM  PFT Results  FVC-Pre L 3.49  3.41   FVC-Predicted Pre % 84  82   FVC-Post L 3.46  3.47   FVC-Predicted Post % 84  83   Pre FEV1/FVC % % 89  86   Post  FEV1/FCV % % 87  87   FEV1-Pre L 3.09  2.94   FEV1-Predicted Pre % 103  97   FEV1-Post L 2.99  3.00   DLCO uncorrected ml/min/mmHg 19.21  19.65   DLCO UNC% % 78  79   DLCO corrected ml/min/mmHg 19.21  20.64   DLCO COR %Predicted % 78  83   DLVA Predicted % 92  102   TLC L 5.69  5.79   TLC % Predicted % 83  84   RV % Predicted % 82  94   Personally interpreted as normal spirometry, no bronchodilator response, lung branch is in normal limits, DLCO mildly reduced  WALK:     03/24/2023    9:28 AM  SIX MIN WALK  Medications Asprin 81 mg, Voltaren  Gel, Diclofenac  Sodium 100mg  CR, Lisinoprin 20 mg, Metoprolol  Tartrate 50 mg, Omeprazole  20 mg, Testosterone  20.25 mg/1.25 gm gel, Thiamine  250 mg  Supplimental Oxygen during Test? (L/min) No  Laps 21  Partial Lap (in Meters) 3 meters  Baseline BP (sitting) 128/64   Baseline Heartrate 68  Baseline Dyspnea (Borg Scale) 0  Baseline Fatigue (Borg Scale) 0.5  Baseline SPO2 97 %  BP (sitting) 164/74  Heartrate 100  Dyspnea (Borg Scale) 1  Fatigue (Borg Scale) 0  SPO2 95 %  BP (sitting) 126/66  Heartrate 62  SPO2 98 %  Stopped or Paused before Six Minutes No  Interpretation Angina;Hip pain;Leg pain  Distance Completed 717 meters  Distance Completed 3 meters  Tech Comments: Patient walked at an average pace with no stops for sob, fatigue or pain.  Tolerated walk well.    Imaging: Personally reviewed and as per EMR discussion this note No results found.  Lab Results: Personally reviewed CBC    Component Value Date/Time   WBC 4.1 03/10/2024 1035   RBC 4.36 03/10/2024 1035   HGB 13.1 03/10/2024 1035   HCT 39.8 03/10/2024 1035   PLT 157.0 03/10/2024 1035   MCV 91.3 03/10/2024 1035   MCH 30.8 12/24/2023 2005   MCHC 33.0 03/10/2024 1035   RDW 15.1 03/10/2024 1035   LYMPHSABS 1.1 03/10/2024 1035   MONOABS 0.4 03/10/2024 1035   EOSABS 0.2 03/10/2024 1035   BASOSABS 0.0 03/10/2024 1035    BMET    Component Value Date/Time   NA 141 03/10/2024 1035   K 4.3 03/10/2024 1035   CL 106 03/10/2024 1035   CO2 28 03/10/2024 1035   GLUCOSE 77 03/10/2024 1035   BUN 13 03/10/2024 1035   CREATININE 0.94 03/10/2024 1035   CALCIUM  9.7 03/10/2024 1035   GFRNONAA 59 (L) 12/24/2023 2005   GFRAA >60 02/14/2020 1720    BNP    Component Value Date/Time   BNP 160.4 (H) 06/22/2022 1401    ProBNP    Component Value Date/Time   PROBNP 565 (H) 03/24/2023 1043   PROBNP 260.0 (H) 12/09/2022 1602    Specialty Problems       Pulmonary Problems   Allergic rhinitis   Qualifier: Diagnosis of  By: Norleen MD, Lynwood ORN       DOE (dyspnea on exertion)   Qualifier: Diagnosis of  By: Norleen MD, Lynwood ORN       Cough   Wheezing   Pleurisy   Chronic sinusitis   Acute sinus infection   Loud snoring   Sleep apnea   Asthma    Allergies  Allergen  Reactions   Cefuroxime Axetil Rash    Immunization History  Administered Date(s) Administered  Fluad Quad(high Dose 65+) 02/21/2019, 03/12/2021   Fluad Trivalent(High Dose 65+) 06/08/2023   H1N1 04/22/2008   INFLUENZA, HIGH DOSE SEASONAL PF 03/20/2016, 03/16/2017, 03/16/2018, 03/10/2024   Influenza Whole 06/10/2009   Influenza,inj,Quad PF,6+ Mos 04/10/2015   Influenza-Unspecified 03/22/2012, 09/03/2014, 04/22/2020, 04/06/2022   PFIZER(Purple Top)SARS-COV-2 Vaccination 07/24/2019, 08/21/2019, 06/24/2020   Pneumococcal Conjugate-13 12/12/2013   Pneumococcal Polysaccharide-23 12/14/2014   RSV,unspecified 04/06/2022   Td 10/03/2008   Tdap 12/07/2018   Zoster Recombinant(Shingrix) 09/24/2017, 12/20/2017   Zoster, Live 10/12/2012    Past Medical History:  Diagnosis Date   ALLERGIC RHINITIS 09/16/2007   ALLERGY 03/24/2007   Allergy    ANXIETY 03/28/2007   Arthritis    knees, back,shoulder   CAROTID ARTERY STENOSIS, BILATERAL 07/01/2009   yearly checks - no current problems   Cataract    bilateral,removed   CEPHALGIA 06/08/2009   CHEST PAIN 09/16/2007   no current problems   COLONIC POLYPS, HX OF 09/16/2007   COPD (chronic obstructive pulmonary disease) (HCC) 05/12/2017   mild per patient - no inhaler   Dizziness and giddiness 06/08/2009   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   DYSPNEA/SHORTNESS OF BREATH 09/16/2007   ERECTILE DYSFUNCTION 03/28/2007   ESOPHAGEAL STRICTURE 09/16/2007   ESOPHAGITIS 03/24/2007   GERD 03/24/2007   GLUCOSE INTOLERANCE 09/16/2007   HEMORRHOIDS, INTERNAL 03/24/2007   HYPERCHOLESTEROLEMIA 03/24/2007   HYPERLIPIDEMIA 09/16/2007   HYPERTENSION 03/28/2007   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/2011   Impaired glucose tolerance 10/04/2010   LOW BACK PAIN 03/28/2007   MAGNETIC RESONANCE IMAGING, BRAIN, ABNORMAL 06/26/2009   NECK PAIN, LEFT 06/20/2010   OBESITY 03/24/2007   OTITIS MEDIA, LEFT 06/10/2009   Palpitations 06/20/2010   no current problems   PVD  06/25/2009   RASH-NONVESICULAR 06/17/2009   Sleep apnea 12/25/2021   URI 08/16/2009    Tobacco History: Social History   Tobacco Use  Smoking Status Former   Current packs/day: 0.00   Average packs/day: 0.5 packs/day for 15.0 years (7.5 ttl pk-yrs)   Types: Cigarettes   Start date: 06/22/1976   Quit date: 06/23/1991   Years since quitting: 32.7  Smokeless Tobacco Never  Tobacco Comments   Quit 30 years ago   Counseling given: Not Answered Tobacco comments: Quit 30 years ago   Continue to not smoke  Outpatient Encounter Medications as of 03/31/2024  Medication Sig   acetaminophen  (TYLENOL ) 650 MG CR tablet Take 650 mg by mouth at bedtime.   aspirin  81 MG EC tablet Take 1 tablet (81 mg total) by mouth daily. Swallow whole.   azelastine  (ASTELIN ) 0.1 % nasal spray Place 2 sprays into both nostrils 2 (two) times daily. Use in each nostril as directed   Azelastine  HCl 137 MCG/SPRAY SOLN SMARTSIG:1-2 Spray(s) Both Nares Twice Daily PRN   Cyanocobalamin  (B-12 PO) Take 2,500 mcg by mouth daily.   diclofenac  Sodium (VOLTAREN ) 1 % GEL Apply 2 g topically daily.   Diclofenac  Sodium CR 100 MG 24 hr tablet Take 1 tablet (100 mg total) by mouth daily.   ezetimibe  (ZETIA ) 10 MG tablet Take 1 tablet by mouth once daily   fluticasone  (FLONASE ) 50 MCG/ACT nasal spray Place 2 sprays into both nostrils in the morning and at bedtime. USE 2 SPRAY(S) IN EACH NOSTRIL ONCE DAILY   fluticasone -salmeterol (ADVAIR) 250-50 MCG/ACT AEPB INHALE 1 DOSE BY MOUTH IN THE MORNING AND IN THE EVENING   furosemide  (LASIX ) 40 MG tablet Take 1 tablet by mouth once daily   lisinopril  (ZESTRIL ) 20 MG tablet Take 1 tablet by mouth  once daily   Magnesium 250 MG TABS Take 250 mg by mouth daily.   Melatonin 10 MG TABS Take 10 mg by mouth at bedtime.    methocarbamol  (ROBAXIN ) 500 MG tablet Take 1 tablet (500 mg total) by mouth 2 (two) times daily as needed.   metoprolol  tartrate (LOPRESSOR ) 50 MG tablet Take 1 tablet (50  mg total) by mouth 2 (two) times daily.   Multiple Vitamin (ONE-A-DAY 55 PLUS PO) Take 1 tablet by mouth daily. Men   Omega-3 Fatty Acids (FISH OIL) 1000 MG CAPS Take 1,000 mg by mouth daily.   omeprazole  (PRILOSEC) 20 MG capsule Take 1 capsule by mouth twice daily   potassium chloride  SA (KLOR-CON  M) 20 MEQ tablet Take 1 tablet (20 mEq total) by mouth daily.   rosuvastatin  (CRESTOR ) 40 MG tablet Take 1 tablet by mouth once daily   sildenafil  (VIAGRA ) 100 MG tablet Take 1 tablet (100 mg total) by mouth as needed for erectile dysfunction.   solifenacin  (VESICARE ) 5 MG tablet Take 1 tablet (5 mg total) by mouth daily.   terazosin  (HYTRIN ) 5 MG capsule Take 5 mg by mouth at bedtime.    Testosterone  20.25 MG/1.25GM (1.62%) GEL Apply 2 Packages topically daily. On each shoulder   thiamine  250 MG tablet Take 250 mg by mouth daily.   tirzepatide  (MOUNJARO ) 10 MG/0.5ML Pen Inject 10 mg into the skin once a week.   Turmeric 500 MG TABS Take 1,000 mg by mouth daily. ginger   No facility-administered encounter medications on file as of 03/31/2024.     Review of Systems  Review of Systems  N/a Physical Exam  BP 136/80   Pulse 81   Temp 97.8 F (36.6 C) (Oral)   Ht 5' 9 (1.753 m)   Wt 213 lb 12.8 oz (97 kg)   SpO2 98%   BMI 31.57 kg/m   Wt Readings from Last 5 Encounters:  03/31/24 213 lb 12.8 oz (97 kg)  03/20/24 216 lb (98 kg)  03/10/24 216 lb 6.4 oz (98.2 kg)  12/31/23 228 lb (103.4 kg)  11/05/23 241 lb 3.2 oz (109.4 kg)    BMI Readings from Last 5 Encounters:  03/31/24 31.57 kg/m  03/20/24 31.90 kg/m  03/10/24 31.96 kg/m  12/31/23 33.67 kg/m  11/05/23 35.62 kg/m     Physical Exam General: Sitting in chair, no acute distress Eyes: EOMI, no icterus Neck: Supple, JVP appreciated sitting upright Pulmonary: Clear, normal work of breathing Cardiovascular: Warm,     Assessment & Plan:   Pulmonary hypertension: WHO group 2 disease with NYHA class II symptoms.   Elevated pulmonary capillary wedge with PVR about 1.2.  Previous echocardiogram with dilated left atrium, diastolic dysfunction, MVR.  Discussed at length do not think he would improve with pulm vasodilators given low PVR and high likelihood for decompensation and worsening symptoms development of pulmonary edema etc. with pulmonary vasodilators given left-sided structural disease.  Stressed importance of low-salt diet.    OSA on CPAP: At last visit, review of compliance report reveals excellent adherence, 100% greater than 8 hours every night.  AHI 0.7.  Excellent control.  He is having significant clinical benefit from ongoing CPAP use.  I encouraged him to continue ongoing CPAP use.  Asthma/chronic bronchitis: Well-controlled, minimal symptoms.  Changed inhaler to Stiolto to avoid ICS given voice hoarseness, see if this helps, if not can resume Advair.  Allergic rhinitis: Continue medications per recent ENT evaluation, adding sinus rinses to azelastine  and Flonase .  No follow-ups on file.     Donnice JONELLE Beals, MD 03/31/2024

## 2024-04-03 ENCOUNTER — Other Ambulatory Visit: Payer: Self-pay | Admitting: Internal Medicine

## 2024-04-13 ENCOUNTER — Telehealth: Payer: Self-pay

## 2024-04-13 NOTE — Telephone Encounter (Signed)
 Copied from CRM [id number]. Topic: Clinical - Medication Question >> Apr 13, 2024  4:10 PM Rea ORN wrote: Reason for CRM: Pt is on his last tirzepatide  (MOUNJARO ) 10 MG/0.5ML Pen. He will use that next Wednesday. Pt want to know if dose should increase or stay at 10 mg. Pt stated he notices his appetite is coming back. Please call back to advise.

## 2024-04-14 MED ORDER — TIRZEPATIDE 12.5 MG/0.5ML ~~LOC~~ SOAJ: 12.5000 mg | SUBCUTANEOUS | 3 refills | Status: AC

## 2024-04-14 NOTE — Addendum Note (Signed)
 Addended by: NORLEEN LYNWOOD ORN on: 04/14/2024 11:47 AM   Modules accepted: Orders

## 2024-04-14 NOTE — Telephone Encounter (Signed)
 Ok I have sent the new higher dose mounjaro  12.5 mg weekly to [facility]

## 2024-04-17 NOTE — Telephone Encounter (Signed)
 Pt states understanding no further questions at this time.

## 2024-04-22 DIAGNOSIS — E1151 Type 2 diabetes mellitus with diabetic peripheral angiopathy without gangrene: Secondary | ICD-10-CM | POA: Diagnosis not present

## 2024-04-22 DIAGNOSIS — I130 Hypertensive heart and chronic kidney disease with heart failure and stage 1 through stage 4 chronic kidney disease, or unspecified chronic kidney disease: Secondary | ICD-10-CM | POA: Diagnosis not present

## 2024-04-22 DIAGNOSIS — I272 Pulmonary hypertension, unspecified: Secondary | ICD-10-CM | POA: Diagnosis not present

## 2024-04-22 DIAGNOSIS — E1122 Type 2 diabetes mellitus with diabetic chronic kidney disease: Secondary | ICD-10-CM | POA: Diagnosis not present

## 2024-04-22 DIAGNOSIS — I700 Atherosclerosis of aorta: Secondary | ICD-10-CM | POA: Diagnosis not present

## 2024-04-22 DIAGNOSIS — M1990 Unspecified osteoarthritis, unspecified site: Secondary | ICD-10-CM | POA: Diagnosis not present

## 2024-04-22 DIAGNOSIS — J479 Bronchiectasis, uncomplicated: Secondary | ICD-10-CM | POA: Diagnosis not present

## 2024-04-22 DIAGNOSIS — I509 Heart failure, unspecified: Secondary | ICD-10-CM | POA: Diagnosis not present

## 2024-04-22 DIAGNOSIS — E785 Hyperlipidemia, unspecified: Secondary | ICD-10-CM | POA: Diagnosis not present

## 2024-04-24 ENCOUNTER — Other Ambulatory Visit (HOSPITAL_COMMUNITY): Payer: Self-pay | Admitting: Cardiology

## 2024-04-24 ENCOUNTER — Other Ambulatory Visit: Payer: Self-pay

## 2024-04-24 ENCOUNTER — Encounter: Payer: Self-pay | Admitting: Radiology

## 2024-04-24 ENCOUNTER — Other Ambulatory Visit: Payer: Self-pay | Admitting: Internal Medicine

## 2024-05-04 ENCOUNTER — Telehealth: Payer: Self-pay

## 2024-05-04 ENCOUNTER — Other Ambulatory Visit: Payer: Self-pay

## 2024-05-04 MED ORDER — STIOLTO RESPIMAT 2.5-2.5 MCG/ACT IN AERS: 2.0000 | INHALATION_SPRAY | Freq: Every day | RESPIRATORY_TRACT | 6 refills | Status: DC

## 2024-05-04 NOTE — Telephone Encounter (Signed)
 Copied from CRM #[id number]. Topic: Clinical - Medication Question >> May 04, 2024 11:12 AM Rilla NOVAK wrote: Reason for CRM: Patient states Dr Annella gave him a ample inhalers (2) to try (Stiolto Respimat) and he is calling in to state it works very well for him.  Must better than the last inhaler.  He would like to have Dr Annella order this prescription for him.  Please call patient [phone] to confirm.    ----------------------------------------------------------------------- From previous Reason for Contact - Medication Refill: Medication:   Has the patient contacted their pharmacy?   (Agent: If no, request that the patient contact the pharmacy for the refill. If patient does not wish to contact the pharmacy document the reason why and proceed with request.) (Agent: If yes, when and what did the pharmacy advise?)  This is the patient's preferred pharmacy:  [id number] - [place] ([place]), [place] - [address] [place] ([place]) [place] [address] Phone: [phone] Fax: [phone]  [facility] [address], [place] - [address] AT [address]  [place] [address] Phone: [phone] Fax: [phone]  [id number] [place], [place] - [address] AT [facility] OF [address] [facility] [address] Phone: [phone] Fax: [phone]  Is this the correct pharmacy for this prescription?   If no, delete pharmacy and type the correct one.   Has the prescription been filled recently?    Is the patient out of the medication?    Has the patient been seen for an appointment in the last year OR does the patient have an upcoming appointment?    Can we respond through MyChart?    Agent: Please be advised that Rx refills may take up to 3 business days. We ask that you follow-up with your pharmacy.        Spoke with patient Rx sent to [facility]     -NFN

## 2024-05-17 ENCOUNTER — Ambulatory Visit: Payer: Self-pay

## 2024-05-17 ENCOUNTER — Encounter: Payer: Self-pay | Admitting: Internal Medicine

## 2024-05-17 ENCOUNTER — Ambulatory Visit: Admitting: Internal Medicine

## 2024-05-17 VITALS — BP 126/60 | HR 78 | Temp 97.8°F | Ht 69.0 in | Wt 216.6 lb

## 2024-05-17 DIAGNOSIS — J208 Acute bronchitis due to other specified organisms: Secondary | ICD-10-CM | POA: Diagnosis not present

## 2024-05-17 DIAGNOSIS — J4540 Moderate persistent asthma, uncomplicated: Secondary | ICD-10-CM | POA: Insufficient documentation

## 2024-05-17 DIAGNOSIS — B9689 Other specified bacterial agents as the cause of diseases classified elsewhere: Secondary | ICD-10-CM

## 2024-05-17 DIAGNOSIS — J4541 Moderate persistent asthma with (acute) exacerbation: Secondary | ICD-10-CM | POA: Diagnosis not present

## 2024-05-17 MED ORDER — DOXYCYCLINE HYCLATE 100 MG PO TABS: 100.0000 mg | ORAL_TABLET | Freq: Two times a day (BID) | ORAL | 0 refills | Status: DC

## 2024-05-17 NOTE — Assessment & Plan Note (Signed)
 He presents with a worsening cough and sputum color change, indicating lung bacterial infection. Prescribe doxycycline , one tablet twice a day for one week. Continue over-the-counter cough medication as needed for cough.

## 2024-05-17 NOTE — Patient Instructions (Signed)
 We have sent in doxycycline to take 1 pill twice a day for 1 week.

## 2024-05-17 NOTE — Telephone Encounter (Signed)
 FYI Only or Action Required?: FYI only for provider: appointment scheduled on 11/26.  Patient was last seen in primary care on 03/10/2024 by Norleen Lynwood ORN, MD.  Called Nurse Triage reporting Cough.  Symptoms began x 2 weeks ago.  Interventions attempted: Tylenol , and cough syrup .  Symptoms are: unchanged.  Triage Disposition: See Physician Within 24 Hours  Patient/caregiver understands and will follow disposition?: Yes         Copied from CRM #[id number]. Topic: Clinical - Red Word Triage >> May 17, 2024  8:37 AM Rosina BIRCH wrote: Reason for RMF:rnlhypwh up brownish yellowish thick plegm, nose is beginning to stop up, ears are itchy, tickling sensation on the left side of his body and it feels like his heart is fluttering when he coughs Reason for Disposition  [1] Continuous (nonstop) coughing interferes with work or school AND [2] no improvement using cough treatment per Care Advice  Answer Assessment - Initial Assessment Questions 1. ONSET: When did the cough begin?      X 2.5 weeks  2. SEVERITY: How bad is the cough today?    Moderate  3. SPUTUM: Describe the color of your sputum (e.g., none, dry cough; clear, white, yellow, green)      brownish yellowish, gray   4. HEMOPTYSIS: Are you coughing up any blood? If Yes, ask: How much? (e.g., flecks, streaks, tablespoons, etc.)     No   5. DIFFICULTY BREATHING: Are you having difficulty breathing? If Yes, ask: How bad is it? (e.g., mild, moderate, severe)      No   6. FEVER: Do you have a fever? If Yes, ask: What is your temperature, how was it measured, and when did it start?     No   7. CARDIAC HISTORY: Do you have any history of heart disease? (e.g., heart attack, congestive heart failure)      No   8. LUNG HISTORY: Do you have any history of lung disease?  (e.g., pulmonary embolus, asthma, emphysema)    Asthma  10. OTHER SYMPTOMS: Do you have any other symptoms? (e.g., runny nose, wheezing,  chest pain)  Nasal congestion  Patient called in with complaints of coughing up brownish yellowish thick plegm, nose is beginning to stop up, ears are itchy, tickling sensation on the left side of his body and it feels like his heart is fluttering when he coughs. No Chest pain noted. For home care, patient is taking Tylenol , and cough syrup. Appointment scheduled for evaluation. Patient agrees with plan of care, and will call back if anything changes, or if symptoms worsen.  Protocols used: Cough - Acute Productive-A-AH

## 2024-05-17 NOTE — Assessment & Plan Note (Signed)
 His asthma is managed with Stiolto. He experiences occasional exertional dyspnea without significant activity limitation. Continue current inhaler regimen with Stioto and treat for flare with doxycycline .

## 2024-05-17 NOTE — Progress Notes (Signed)
 Subjective:   Patient ID: Warren Weeks, male    DOB: Nov 15, 1945, 78 y.o.   MRN: [id number]  Discussed the use of AI scribe software for clinical note transcription with the patient, who gave verbal consent to proceed.  History of Present Illness Warren Weeks is a 78 year old male who presents with worsening cough and sputum production.  He has experienced a worsening cough over the past couple of weeks. Initially, the cough produced thick white sputum, but it has since changed to yellow and dark gray. The cough has intensified, particularly last night, prompting him to seek medical attention. He continues to use his lung inhalers, including Stiolto and Advair. He took over-the-counter cough medication last night, which allowed him to sleep through the night.  He describes a sensation of ear itching that began yesterday, accompanied by a tickling feeling. No significant sinus drainage, but he mentions nasal congestion. He has a history of sinus infections occurring two to three times a year.  Regarding respiratory symptoms, he experiences tightness in his chest when coughing frequently. He reports occasional shortness of breath with exertion, such as climbing seven steps, but this resolves quickly and does not limit his daily activities. He also notes a sensation of his heart racing after these episodes.  No fever or chills, but he mentions feeling cold at night, particularly in his feet, and a general sensation of being cold even when indoors, which his wife does not experience.  He expresses concern about potential weight gain, but his weight remains stable at 216 pounds, the same as in September.  Review of Systems  Constitutional:  Positive for activity change, appetite change and chills. Negative for fatigue, fever and unexpected weight change.  HENT:  Positive for congestion and postnasal drip. Negative for ear discharge, ear pain, rhinorrhea, sinus pressure, sinus pain, sneezing, sore  throat, tinnitus, trouble swallowing and voice change.   Eyes: Negative.   Respiratory:  Positive for cough and shortness of breath. Negative for chest tightness and wheezing.   Cardiovascular: Negative.   Gastrointestinal: Negative.   Musculoskeletal:  Positive for myalgias.  Neurological: Negative.     Objective:  Physical Exam Constitutional:      Appearance: He is well-developed.  HENT:     Head: Normocephalic and atraumatic.     Comments: Oropharynx with redness and clear drainage, nose with swollen turbinates, TMs normal bilaterally.  Neck:     Thyroid : No thyromegaly.  Cardiovascular:     Rate and Rhythm: Normal rate and regular rhythm.  Pulmonary:     Effort: Pulmonary effort is normal. No respiratory distress.     Breath sounds: Rhonchi present. No wheezing or rales.  Abdominal:     Palpations: Abdomen is soft.  Musculoskeletal:        General: Tenderness present.     Cervical back: Normal range of motion.  Lymphadenopathy:     Cervical: No cervical adenopathy.  Skin:    General: Skin is warm and dry.  Neurological:     Mental Status: He is alert and oriented to person, place, and time.     Vitals:   05/17/24 1033  BP: 126/60  Pulse: 78  Temp: 97.8 F (36.6 C)  TempSrc: Oral  SpO2: 96%  Weight: 216 lb 9.6 oz (98.2 kg)  Height: 5' 9 (1.753 m)    Assessment and Plan Assessment & Plan Acute bacterial bronchitis in a patient with asthma   He presents with a worsening cough and sputum  color change, indicating lung bacterial infection. Prescribe doxycycline , one tablet twice a day for one week. Continue over-the-counter cough medication as needed for cough.  Moderate persistent asthma with flare   His asthma is managed with Stiolto. He experiences occasional exertional dyspnea without significant activity limitation. Continue current inhaler regimen with Stioto and treat for flare with doxycycline .

## 2024-06-02 ENCOUNTER — Ambulatory Visit: Payer: Self-pay

## 2024-06-02 NOTE — Telephone Encounter (Signed)
 FYI Only or Action Required?: FYI only for provider: appointment scheduled on 12/15.  Patient was last seen in primary care on 05/17/2024 by Rollene Almarie LABOR, MD.  Called Nurse Triage reporting Cough.  Symptoms began a week ago.  Interventions attempted: Prescription medications: doxy, inhalers.  Symptoms are: gradually worsening.  Triage Disposition: See Physician Within 24 Hours  Patient/caregiver understands and will follow disposition?: Yes  Copied from CRM #[id number]. Topic: Clinical - Red Word Triage >> Jun 02, 2024  4:26 PM Tysheama G wrote: Red Word that prompted transfer to Nurse Triage: productive cough and discolored phlegm. Reason for Disposition  [1] Continuous (nonstop) coughing interferes with work or school AND [2] no improvement using cough treatment per Care Advice  Answer Assessment - Initial Assessment Questions Finished a round of Doxycycline - had some improvement but still have nasty cough with green/grey/white phlegm.  Cannot taste food- started about 3 weeks ago- is taking 2 meds that pharmacy confirmed can cause taste alteration or loss.   Denies SOB, CP, Dizziness, Fever. No new symptoms. Patient requsting steroid taper for throat irritation from coughing. Advised appt. Made for 12/15 with pcp office. Ed precautions understood.   1. ONSET: When did the cough begin?      About 2 weeks ago  2. SEVERITY: How bad is the cough today?      Terrible cough- worsened with talking -  3. SPUTUM: Describe the color of your sputum (e.g., none, dry cough; clear, white, yellow, green)     Green-white- thick 4. HEMOPTYSIS: Are you coughing up any blood? If Yes, ask: How much? (e.g., flecks, streaks, tablespoons, etc.)     denies 5. DIFFICULTY BREATHING: Are you having difficulty breathing? If Yes, ask: How bad is it? (e.g., mild, moderate, severe)      denies 6. FEVER: Do you have a fever? If Yes, ask: What is your temperature, how was it measured,  and when did it start?     Denies  7. CARDIAC HISTORY: Do you have any history of heart disease? (e.g., heart attack, congestive heart failure)      HTN 8. LUNG HISTORY: Do you have any history of lung disease?  (e.g., pulmonary embolus, asthma, emphysema)     pleurisy 9. PE RISK FACTORS: Do you have a history of blood clots? (or: recent major surgery, recent prolonged travel, bedridden)     denies 10. OTHER SYMPTOMS: Do you have any other symptoms? (e.g., runny nose, wheezing, chest pain)       denies  Protocols used: Cough - Acute Productive-A-AH

## 2024-06-05 ENCOUNTER — Encounter: Payer: Self-pay | Admitting: Family Medicine

## 2024-06-05 ENCOUNTER — Other Ambulatory Visit (HOSPITAL_COMMUNITY): Payer: Self-pay

## 2024-06-05 ENCOUNTER — Telehealth: Payer: Self-pay

## 2024-06-05 ENCOUNTER — Ambulatory Visit: Admitting: Family Medicine

## 2024-06-05 VITALS — BP 128/62 | HR 76 | Temp 98.1°F | Resp 18 | Ht 69.0 in | Wt 209.0 lb

## 2024-06-05 DIAGNOSIS — J302 Other seasonal allergic rhinitis: Secondary | ICD-10-CM

## 2024-06-05 DIAGNOSIS — J4541 Moderate persistent asthma with (acute) exacerbation: Secondary | ICD-10-CM

## 2024-06-05 MED ORDER — PREDNISONE 20 MG PO TABS: 40.0000 mg | ORAL_TABLET | Freq: Every day | ORAL | 0 refills | Status: AC

## 2024-06-05 MED ORDER — DEXTROMETHORPHAN HBR 15 MG/5ML PO SYRP: 10.0000 mL | ORAL_SOLUTION | Freq: Four times a day (QID) | ORAL | 0 refills | Status: AC | PRN

## 2024-06-05 MED ORDER — LEVOCETIRIZINE DIHYDROCHLORIDE 5 MG PO TABS: 5.0000 mg | ORAL_TABLET | Freq: Every evening | ORAL | 2 refills | Status: AC

## 2024-06-05 NOTE — Progress Notes (Signed)
 Assessment & Plan Moderate persistent asthma with acute exacerbation Asthma exacerbation likely due to recent illness and inflammation. Cough persists, possibly due to drainage from allergies. - Prescribed prednisone  to reduce inflammation and alleviate cough. - Prescribed cough syrup for symptomatic relief. Orders:   predniSONE  (DELTASONE ) 20 MG tablet; Take 2 tablets (40 mg total) by mouth daily for 5 days.   dextromethorphan  15 MG/5ML syrup; Take 10 mLs (30 mg total) by mouth 4 (four) times daily as needed for cough.  Seasonal allergies Contributing to nasal congestion and drainage, possibly exacerbating cough. - Prescribed Xyzal  for allergy management. - Continue nasal sprays for symptom control.  Orders:   levocetirizine (XYZAL ) 5 MG tablet; Take 1 tablet (5 mg total) by mouth every evening.   Follow up plan: Return if symptoms worsen or fail to improve.  Niki Rung, MSN, APRN, FNP-C  Subjective:  HPI: Warren Weeks is a 78 y.o. male presenting on 06/05/2024 for Cough (Cont cough for about 2.5 weeks - congestion is thick and yellow - no fever or other symptoms /Completed DOXY )  Discussed the use of AI scribe software for clinical note transcription with the patient, who gave verbal consent to proceed.  He has had a persistent cough for approximately two and a half weeks. Initially, he produced dark phlegm, which has since lightened to a beige color and remains thick. He completed a course of doxycycline , but the cough persists. No wheezing or shortness of breath.  He experiences nasal congestion, which he attributes to allergies. He uses Flonase  nasal spray to alleviate symptoms, noting that his nasal passages do not open well until he uses the spray.  He mentions a loss of taste, which he associates with his current medications, including the nasal spray. He eats but does not taste anything.      ROS: Negative unless specifically indicated above in HPI.   Relevant  past medical history reviewed and updated as indicated.   Allergies and medications reviewed and updated.  Current Medications[1]  Allergies[2]  Objective:   BP 128/62   Pulse 76   Temp 98.1 F (36.7 C)   Resp 18   Ht 5' 9 (1.753 m)   Wt 209 lb (94.8 kg)   SpO2 97%   BMI 30.86 kg/m    Physical Exam Vitals reviewed.  Constitutional:      General: He is not in acute distress.    Appearance: Normal appearance. He is not ill-appearing, toxic-appearing or diaphoretic.  HENT:     Head: Normocephalic and atraumatic.     Nose:     Right Turbinates: Enlarged and pale.     Left Turbinates: Enlarged and pale.  Eyes:     General: No scleral icterus.       Right eye: No discharge.        Left eye: No discharge.     Conjunctiva/sclera: Conjunctivae normal.  Cardiovascular:     Rate and Rhythm: Normal rate and regular rhythm.     Heart sounds: Normal heart sounds. No murmur heard.    No friction rub. No gallop.  Pulmonary:     Effort: Pulmonary effort is normal. No respiratory distress.     Breath sounds: Normal breath sounds. No stridor. No wheezing, rhonchi or rales.  Musculoskeletal:        General: Normal range of motion.     Cervical back: Normal range of motion.  Skin:    General: Skin is warm and dry.  Neurological:  Mental Status: He is alert and oriented to person, place, and time. Mental status is at baseline.  Psychiatric:        Mood and Affect: Mood normal.        Behavior: Behavior normal.        Thought Content: Thought content normal.        Judgment: Judgment normal.            [1]  Current Outpatient Medications:    acetaminophen  (TYLENOL ) 650 MG CR tablet, Take 650 mg by mouth at bedtime., Disp: , Rfl:    aspirin  81 MG EC tablet, Take 1 tablet (81 mg total) by mouth daily. Swallow whole., Disp: 30 tablet, Rfl: 12   azelastine  (ASTELIN ) 0.1 % nasal spray, Place 2 sprays into both nostrils 2 (two) times daily. Use in each nostril as directed,  Disp: 30 mL, Rfl: 12   Azelastine  HCl 137 MCG/SPRAY SOLN, SMARTSIG:1-2 Spray(s) Both Nares Twice Daily PRN, Disp: , Rfl:    Cyanocobalamin  (B-12 PO), Take 2,500 mcg by mouth daily., Disp: , Rfl:    dextromethorphan  15 MG/5ML syrup, Take 10 mLs (30 mg total) by mouth 4 (four) times daily as needed for cough., Disp: 120 mL, Rfl: 0   diclofenac  Sodium (VOLTAREN ) 1 % GEL, Apply 2 g topically daily., Disp: , Rfl:    Diclofenac  Sodium CR 100 MG 24 hr tablet, Take 1 tablet (100 mg total) by mouth daily., Disp: 90 tablet, Rfl: 1   ezetimibe  (ZETIA ) 10 MG tablet, Take 1 tablet by mouth once daily, Disp: 90 tablet, Rfl: 3   fluticasone  (FLONASE ) 50 MCG/ACT nasal spray, Place 2 sprays into both nostrils in the morning and at bedtime. USE 2 SPRAY(S) IN EACH NOSTRIL ONCE DAILY, Disp: 16 g, Rfl: 12   furosemide  (LASIX ) 40 MG tablet, Take 1 tablet by mouth once daily, Disp: 90 tablet, Rfl: 2   levocetirizine (XYZAL ) 5 MG tablet, Take 1 tablet (5 mg total) by mouth every evening., Disp: 30 tablet, Rfl: 2   lisinopril  (ZESTRIL ) 20 MG tablet, Take 1 tablet by mouth once daily, Disp: 90 tablet, Rfl: 3   Melatonin 10 MG TABS, Take 10 mg by mouth at bedtime. , Disp: , Rfl:    methocarbamol  (ROBAXIN ) 500 MG tablet, Take 1 tablet (500 mg total) by mouth 2 (two) times daily as needed., Disp: 60 tablet, Rfl: 2   metoprolol  tartrate (LOPRESSOR ) 50 MG tablet, Take 1 tablet (50 mg total) by mouth 2 (two) times daily., Disp: 180 tablet, Rfl: 3   Multiple Vitamin (ONE-A-DAY 55 PLUS PO), Take 1 tablet by mouth daily. Men, Disp: , Rfl:    omeprazole  (PRILOSEC) 20 MG capsule, Take 1 capsule by mouth twice daily, Disp: 180 capsule, Rfl: 0   potassium chloride  SA (KLOR-CON  M) 20 MEQ tablet, Take 1 tablet by mouth once daily, Disp: 60 tablet, Rfl: 3   predniSONE  (DELTASONE ) 20 MG tablet, Take 2 tablets (40 mg total) by mouth daily for 5 days., Disp: 10 tablet, Rfl: 0   rosuvastatin  (CRESTOR ) 40 MG tablet, Take 1 tablet by mouth once  daily, Disp: 90 tablet, Rfl: 0   sildenafil  (VIAGRA ) 100 MG tablet, Take 1 tablet (100 mg total) by mouth as needed for erectile dysfunction., Disp: 10 tablet, Rfl: 11   solifenacin  (VESICARE ) 5 MG tablet, Take 1 tablet (5 mg total) by mouth daily., Disp: 90 tablet, Rfl: 3   terazosin  (HYTRIN ) 5 MG capsule, Take 5 mg by mouth at bedtime. , Disp: ,  Rfl:    Testosterone  20.25 MG/1.25GM (1.62%) GEL, Apply 2 Packages topically daily. On each shoulder, Disp: , Rfl:    Tiotropium Bromide-Olodaterol (STIOLTO RESPIMAT ) 2.5-2.5 MCG/ACT AERS, Inhale 2 puffs into the lungs daily., Disp: 1 each, Rfl: 6   tirzepatide  (MOUNJARO ) 12.5 MG/0.5ML Pen, Inject 12.5 mg into the skin once a week., Disp: 6 mL, Rfl: 3   Turmeric 500 MG TABS, Take 1,000 mg by mouth daily. ginger, Disp: , Rfl:  [2]  Allergies Allergen Reactions   Cefuroxime Axetil Rash

## 2024-06-05 NOTE — Telephone Encounter (Signed)
 Pharmacy Patient Advocate Encounter   Received notification from Onbase that prior authorization for Mounjaro  12.5 is required/requested.   Insurance verification completed.   The patient is insured through [employer].   Per test claim: Refill too soon. PA is not needed at this time. Medication was filled 04/14/24. Next eligible fill date is 06/23/24.

## 2024-06-05 NOTE — Assessment & Plan Note (Signed)
 Asthma exacerbation likely due to recent illness and inflammation. Cough persists, possibly due to drainage from allergies. - Prescribed prednisone  to reduce inflammation and alleviate cough. - Prescribed cough syrup for symptomatic relief. Orders:   predniSONE  (DELTASONE ) 20 MG tablet; Take 2 tablets (40 mg total) by mouth daily for 5 days.   dextromethorphan  15 MG/5ML syrup; Take 10 mLs (30 mg total) by mouth 4 (four) times daily as needed for cough.

## 2024-06-07 ENCOUNTER — Encounter (INDEPENDENT_AMBULATORY_CARE_PROVIDER_SITE_OTHER): Payer: Self-pay

## 2024-06-07 ENCOUNTER — Telehealth (INDEPENDENT_AMBULATORY_CARE_PROVIDER_SITE_OTHER): Payer: Self-pay

## 2024-06-07 NOTE — Telephone Encounter (Signed)
 Patient called wanting to get instructions for Nasal Rinse from Dr. Tobie. I copied and pasted the instructions for the rinse from Dr. Anthony note and sent them to the patient via MyChart.

## 2024-06-14 ENCOUNTER — Other Ambulatory Visit: Payer: Self-pay | Admitting: Internal Medicine

## 2024-07-03 ENCOUNTER — Other Ambulatory Visit: Payer: Self-pay

## 2024-07-03 ENCOUNTER — Other Ambulatory Visit: Payer: Self-pay | Admitting: Internal Medicine

## 2024-07-04 ENCOUNTER — Other Ambulatory Visit: Payer: Self-pay

## 2024-07-04 MED ORDER — STIOLTO RESPIMAT 2.5-2.5 MCG/ACT IN AERS: 2.0000 | INHALATION_SPRAY | Freq: Every day | RESPIRATORY_TRACT | 6 refills | Status: AC

## 2024-07-11 ENCOUNTER — Ambulatory Visit: Admitting: Internal Medicine

## 2024-07-11 ENCOUNTER — Encounter: Payer: Self-pay | Admitting: Internal Medicine

## 2024-07-11 VITALS — BP 128/80 | HR 64 | Temp 97.6°F | Ht 69.0 in | Wt 215.0 lb

## 2024-07-11 DIAGNOSIS — Z0000 Encounter for general adult medical examination without abnormal findings: Secondary | ICD-10-CM | POA: Diagnosis not present

## 2024-07-11 DIAGNOSIS — Z7985 Long-term (current) use of injectable non-insulin antidiabetic drugs: Secondary | ICD-10-CM

## 2024-07-11 DIAGNOSIS — I10 Essential (primary) hypertension: Secondary | ICD-10-CM

## 2024-07-11 DIAGNOSIS — Z0001 Encounter for general adult medical examination with abnormal findings: Secondary | ICD-10-CM

## 2024-07-11 DIAGNOSIS — E1122 Type 2 diabetes mellitus with diabetic chronic kidney disease: Secondary | ICD-10-CM

## 2024-07-11 DIAGNOSIS — E559 Vitamin D deficiency, unspecified: Secondary | ICD-10-CM

## 2024-07-11 DIAGNOSIS — E7800 Pure hypercholesterolemia, unspecified: Secondary | ICD-10-CM

## 2024-07-11 DIAGNOSIS — N1831 Chronic kidney disease, stage 3a: Secondary | ICD-10-CM | POA: Diagnosis not present

## 2024-07-11 LAB — HEPATIC FUNCTION PANEL
ALT: 79 U/L — ABNORMAL HIGH (ref 3–53)
AST: 70 U/L — ABNORMAL HIGH (ref 5–37)
Albumin: 4 g/dL (ref 3.5–5.2)
Alkaline Phosphatase: 76 U/L (ref 39–117)
Bilirubin, Direct: 0.1 mg/dL (ref 0.1–0.3)
Total Bilirubin: 0.5 mg/dL (ref 0.2–1.2)
Total Protein: 6.9 g/dL (ref 6.0–8.3)

## 2024-07-11 LAB — BASIC METABOLIC PANEL WITH GFR
BUN: 12 mg/dL (ref 6–23)
CO2: 29 meq/L (ref 19–32)
Calcium: 8.9 mg/dL (ref 8.4–10.5)
Chloride: 107 meq/L (ref 96–112)
Creatinine, Ser: 0.96 mg/dL (ref 0.40–1.50)
GFR: 76.99 mL/min
Glucose, Bld: 93 mg/dL (ref 70–99)
Potassium: 4.2 meq/L (ref 3.5–5.1)
Sodium: 140 meq/L (ref 135–145)

## 2024-07-11 LAB — CBC WITH DIFFERENTIAL/PLATELET
Basophils Absolute: 0 K/uL (ref 0.0–0.1)
Basophils Relative: 0.5 % (ref 0.0–3.0)
Eosinophils Absolute: 0.3 K/uL (ref 0.0–0.7)
Eosinophils Relative: 6.2 % — ABNORMAL HIGH (ref 0.0–5.0)
HCT: 36.2 % — ABNORMAL LOW (ref 39.0–52.0)
Hemoglobin: 12.2 g/dL — ABNORMAL LOW (ref 13.0–17.0)
Lymphocytes Relative: 33.2 % (ref 12.0–46.0)
Lymphs Abs: 1.5 K/uL (ref 0.7–4.0)
MCHC: 33.8 g/dL (ref 30.0–36.0)
MCV: 90.6 fl (ref 78.0–100.0)
Monocytes Absolute: 0.4 K/uL (ref 0.1–1.0)
Monocytes Relative: 8.9 % (ref 3.0–12.0)
Neutro Abs: 2.3 K/uL (ref 1.4–7.7)
Neutrophils Relative %: 51.2 % (ref 43.0–77.0)
Platelets: 157 K/uL (ref 150.0–400.0)
RBC: 3.99 Mil/uL — ABNORMAL LOW (ref 4.22–5.81)
RDW: 14.2 % (ref 11.5–15.5)
WBC: 4.6 K/uL (ref 4.0–10.5)

## 2024-07-11 LAB — URINALYSIS, ROUTINE W REFLEX MICROSCOPIC
Bilirubin Urine: NEGATIVE
Hgb urine dipstick: NEGATIVE
Ketones, ur: NEGATIVE
Leukocytes,Ua: NEGATIVE
Nitrite: NEGATIVE
RBC / HPF: NONE SEEN
Specific Gravity, Urine: 1.01 (ref 1.000–1.030)
Total Protein, Urine: NEGATIVE
Urine Glucose: NEGATIVE
Urobilinogen, UA: 0.2 (ref 0.0–1.0)
pH: 6.5 (ref 5.0–8.0)

## 2024-07-11 LAB — LIPID PANEL
Cholesterol: 122 mg/dL (ref 28–200)
HDL: 58.9 mg/dL
LDL Cholesterol: 52 mg/dL (ref 10–99)
NonHDL: 63.47
Total CHOL/HDL Ratio: 2
Triglycerides: 56 mg/dL (ref 10.0–149.0)
VLDL: 11.2 mg/dL (ref 0.0–40.0)

## 2024-07-11 LAB — TSH: TSH: 2.04 u[IU]/mL (ref 0.35–5.50)

## 2024-07-11 LAB — HEMOGLOBIN A1C: Hgb A1c MFr Bld: 5.7 % (ref 4.6–6.5)

## 2024-07-11 NOTE — Assessment & Plan Note (Signed)
 Ckd3a  Lab Results  Component Value Date   HGBA1C 6.1 03/10/2024   Stable, pt to continue current medical treatment mounjaro  12.5 mg weekly

## 2024-07-11 NOTE — Assessment & Plan Note (Signed)
 BP Readings from Last 3 Encounters:  07/11/24 128/80  06/05/24 128/62  05/17/24 126/60   Stable, pt to continue medical treatment lisinopril  20 mg every day, lopressor  50 bid

## 2024-07-11 NOTE — Patient Instructions (Signed)

## 2024-07-11 NOTE — Assessment & Plan Note (Signed)
 Lab Results  Component Value Date   LDLCALC 68 03/10/2024   Stable, pt to continue current statin crestor  40 mg every day, zetia  10 mg, for f/y lab next visit

## 2024-07-11 NOTE — Assessment & Plan Note (Signed)
 Age and sex appropriate education and counseling updated with regular exercise and diet Referrals for preventative services - for optho feb 23 Immunizations addressed - none needed Smoking counseling  - none needed Evidence for depression or other mood disorder - none significant Most recent labs reviewed. I have personally reviewed and have noted: 1) the patient's medical and social history 2) The patient's current medications and supplements 3) The patient's height, weight, and BMI have been recorded in the chart

## 2024-07-11 NOTE — Assessment & Plan Note (Signed)
 Last vitamin D  Lab Results  Component Value Date   VD25OH 34.35 03/10/2024   Low, to start oral replacement

## 2024-07-11 NOTE — Progress Notes (Signed)
 Patient ID: Warren Weeks, male   DOB: 07-05-1945, 79 y.o.   MRN: [id number]         Chief Complaint:: wellness exam and low vit d, hld, htn, dm with ckd3a       HPI:  Warren Weeks is a 79 y.o. male here for wellness exam; for eye exam feb 23; o/w up to date                        Also Pt denies chest pain, increased sob or doe, wheezing, orthopnea, PND, increased LE swelling, palpitations, dizziness or syncope.   Pt denies polydipsia, polyuria, or new focal neuro s/s.    Pt denies fever, wt loss, night sweats, loss of appetite, or other constitutional symptoms     Wt Readings from Last 3 Encounters:  07/11/24 215 lb (97.5 kg)  06/05/24 209 lb (94.8 kg)  05/17/24 216 lb 9.6 oz (98.2 kg)   BP Readings from Last 3 Encounters:  07/11/24 128/80  06/05/24 128/62  05/17/24 126/60   Immunization History  Administered Date(s) Administered   Fluad Quad(high Dose 65+) 02/21/2019, 03/12/2021   Fluad Trivalent(High Dose 65+) 06/08/2023   H1N1 04/22/2008   INFLUENZA, HIGH DOSE SEASONAL PF 03/20/2016, 03/16/2017, 03/16/2018, 03/10/2024   Influenza Whole 06/10/2009   Influenza,inj,Quad PF,6+ Mos 04/10/2015   Influenza-Unspecified 03/22/2012, 09/03/2014, 04/22/2020, 04/06/2022   PFIZER(Purple Top)SARS-COV-2 Vaccination 07/24/2019, 08/21/2019, 06/24/2020   Pneumococcal Conjugate-13 12/12/2013   Pneumococcal Polysaccharide-23 12/14/2014   RSV,unspecified 04/06/2022   Td 10/03/2008   Tdap 12/07/2018   Zoster Recombinant(Shingrix) 09/24/2017, 12/20/2017   Zoster, Live 10/12/2012   Health Maintenance Due  Topic Date Due   OPHTHALMOLOGY EXAM  11/17/2023   Medicare Annual Wellness (AWV)  08/08/2024      Past Medical History:  Diagnosis Date   ALLERGIC RHINITIS 09/16/2007   ALLERGY 03/24/2007   Allergy    ANXIETY 03/28/2007   Arthritis    knees, back,shoulder   CAROTID ARTERY STENOSIS, BILATERAL 07/01/2009   yearly checks - no current problems   Cataract    bilateral,removed    CEPHALGIA 06/08/2009   CHEST PAIN 09/16/2007   no current problems   COLONIC POLYPS, HX OF 09/16/2007   COPD (chronic obstructive pulmonary disease) (HCC) 05/12/2017   mild per patient - no inhaler   Dizziness and giddiness 06/08/2009   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   DYSPNEA/SHORTNESS OF BREATH 09/16/2007   ERECTILE DYSFUNCTION 03/28/2007   ESOPHAGEAL STRICTURE 09/16/2007   ESOPHAGITIS 03/24/2007   GERD 03/24/2007   GLUCOSE INTOLERANCE 09/16/2007   HEMORRHOIDS, INTERNAL 03/24/2007   HYPERCHOLESTEROLEMIA 03/24/2007   HYPERLIPIDEMIA 09/16/2007   HYPERTENSION 03/28/2007   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/2011   Impaired glucose tolerance 10/04/2010   LOW BACK PAIN 03/28/2007   MAGNETIC RESONANCE IMAGING, BRAIN, ABNORMAL 06/26/2009   NECK PAIN, LEFT 06/20/2010   OBESITY 03/24/2007   OTITIS MEDIA, LEFT 06/10/2009   Palpitations 06/20/2010   no current problems   PVD 06/25/2009   RASH-NONVESICULAR 06/17/2009   Sleep apnea 12/25/2021   URI 08/16/2009   Past Surgical History:  Procedure Laterality Date   COLONOSCOPY  03/2013   polyps/[place]   LEFT HEART CATH AND CORONARY ANGIOGRAPHY N/A 10/14/2021   Procedure: LEFT HEART CATH AND CORONARY ANGIOGRAPHY;  Surgeon: Dann Candyce RAMAN, MD;  Location: [facility];  Service: Cardiovascular;  Laterality: N/A;   NASAL SEPTOPLASTY W/ TURBINOPLASTY Bilateral 01/03/2021   Procedure: NASAL SEPTOPLASTY WITH BILATERAL TURBINATE REDUCTION;  Surgeon: Karis Clunes, MD;  Location: [facility];  Service: ENT;  Laterality: Bilateral;   RIGHT HEART CATH N/A 02/04/2023   Procedure: RIGHT HEART CATH;  Surgeon: Rolan Ezra RAMAN, MD;  Location: [facility];  Service: Cardiovascular;  Laterality: N/A;   s/p right knee arthroscopy     UPPER GASTROINTESTINAL ENDOSCOPY     gerd    reports that he quit smoking about 33 years ago. His smoking use included cigarettes. He started smoking about 48 years ago. He has a 7.5 pack-year smoking history.  He has never used smokeless tobacco. He reports that he does not currently use alcohol after a past usage of about 7.0 standard drinks of alcohol per week. He reports that he does not use drugs. family history includes Coronary artery disease in his brother and sister; Heart disease in his father; Lung cancer in his sister; Lymphoma in his mother; Ovarian cancer in his sister. Allergies[1] Medications Ordered Prior to Encounter[2]      ROS:  All others reviewed and negative.  Objective        PE:  BP 128/80 (BP Location: Left Arm, Patient Position: Sitting, Cuff Size: Normal)   Pulse 64   Temp 97.6 F (36.4 C) (Oral)   Ht 5' 9 (1.753 m)   Wt 215 lb (97.5 kg)   SpO2 99%   BMI 31.75 kg/m                 Constitutional: Pt appears in NAD               HENT: Head: NCAT.                Right Ear: External ear normal.                 Left Ear: External ear normal.                Eyes: . Pupils are equal, round, and reactive to light. Conjunctivae and EOM are normal               Nose: without d/c or deformity               Neck: Neck supple. Gross normal ROM               Cardiovascular: Normal rate and regular rhythm.                 Pulmonary/Chest: Effort normal and breath sounds without rales or wheezing.                Abd:  Soft, NT, ND, + BS, no organomegaly               Neurological: Pt is alert. At baseline orientation, motor grossly intact               Skin: Skin is warm. No rashes, no other new lesions, LE edema - none               Psychiatric: Pt behavior is normal without agitation   Micro: none  Cardiac tracings I have personally interpreted today:  none  Pertinent Radiological findings (summarize): none   Lab Results  Component Value Date   WBC 4.1 03/10/2024   HGB 13.1 03/10/2024   HCT 39.8 03/10/2024   PLT 157.0 03/10/2024   GLUCOSE 77 03/10/2024   CHOL 141 03/10/2024   TRIG 63.0 03/10/2024   HDL 60.10 03/10/2024   LDLDIRECT 176.0 04/03/2011  LDLCALC  68 03/10/2024   ALT 38 03/10/2024   AST 33 03/10/2024   NA 141 03/10/2024   K 4.3 03/10/2024   CL 106 03/10/2024   CREATININE 0.94 03/10/2024   BUN 13 03/10/2024   CO2 28 03/10/2024   TSH 3.11 09/08/2023   PSA 0.21 09/08/2023   HGBA1C 6.1 03/10/2024   MICROALBUR <0.7 09/08/2023   Assessment/Plan:  Warren Weeks is a 79 y.o. Black or African American [2] male with  has a past medical history of ALLERGIC RHINITIS (09/16/2007), ALLERGY (03/24/2007), Allergy, ANXIETY (03/28/2007), Arthritis, CAROTID ARTERY STENOSIS, BILATERAL (07/01/2009), Cataract, CEPHALGIA (06/08/2009), CHEST PAIN (09/16/2007), COLONIC POLYPS, HX OF (09/16/2007), COPD (chronic obstructive pulmonary disease) (HCC) (05/12/2017), Dizziness and giddiness (06/08/2009), Dyshidrotic eczema (06/03/2018), DYSPNEA/SHORTNESS OF BREATH (09/16/2007), ERECTILE DYSFUNCTION (03/28/2007), ESOPHAGEAL STRICTURE (09/16/2007), ESOPHAGITIS (03/24/2007), GERD (03/24/2007), GLUCOSE INTOLERANCE (09/16/2007), HEMORRHOIDS, INTERNAL (03/24/2007), HYPERCHOLESTEROLEMIA (03/24/2007), HYPERLIPIDEMIA (09/16/2007), HYPERTENSION (03/28/2007), Hypogonadism male (10/09/2011), Impaired glucose tolerance (10/04/2010), LOW BACK PAIN (03/28/2007), MAGNETIC RESONANCE IMAGING, BRAIN, ABNORMAL (06/26/2009), NECK PAIN, LEFT (06/20/2010), OBESITY (03/24/2007), OTITIS MEDIA, LEFT (06/10/2009), Palpitations (06/20/2010), PVD (06/25/2009), RASH-NONVESICULAR (06/17/2009), Sleep apnea (12/25/2021), and URI (08/16/2009).  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  Lab Results  Component Value Date   HGBA1C 6.1 03/10/2024   Stable, pt to continue current medical treatment mounjaro  12.5 mg weekly   Essential hypertension BP Readings from Last 3 Encounters:  07/11/24 128/80  06/05/24 128/62  05/17/24 126/60   Stable, pt to continue medical treatment lisinopril  20 mg every day, lopressor  50 bid   Vitamin  D deficiency Last vitamin D  Lab Results  Component Value Date   VD25OH 34.35 03/10/2024   Low, to start oral replacement   Encounter for well adult exam with abnormal findings Age and sex appropriate education and counseling updated with regular exercise and diet Referrals for preventative services - for optho feb 23 Immunizations addressed - none needed Smoking counseling  - none needed Evidence for depression or other mood disorder - none significant Most recent labs reviewed. I have personally reviewed and have noted: 1) the patient's medical and social history 2) The patient's current medications and supplements 3) The patient's height, weight, and BMI have been recorded in the chart   Hyperlipidemia Lab Results  Component Value Date   LDLCALC 68 03/10/2024   Stable, pt to continue current statin crestor  40 mg every day, zetia  10 mg, for f/y lab next visit  Followup: Return in about 6 months (around 01/08/2025).  Lynwood Rush, MD 07/11/2024 12:30 PM [facility]     [1]  Allergies Allergen Reactions   Cefuroxime Axetil Rash  [2]  Current Outpatient Medications on File Prior to Visit  Medication Sig Dispense Refill   acetaminophen  (TYLENOL ) 650 MG CR tablet Take 650 mg by mouth at bedtime.     aspirin  81 MG EC tablet Take 1 tablet (81 mg total) by mouth daily. Swallow whole. 30 tablet 12   azelastine  (ASTELIN ) 0.1 % nasal spray Place 2 sprays into both nostrils 2 (two) times daily. Use in each nostril as directed 30 mL 12   Azelastine  HCl 137 MCG/SPRAY SOLN SMARTSIG:1-2 Spray(s) Both Nares Twice Daily PRN     Cyanocobalamin  (B-12 PO) Take 2,500 mcg by mouth daily.     dextromethorphan  15 MG/5ML syrup Take 10 mLs (30 mg total) by mouth 4 (four) times daily as needed for cough. 120 mL 0  diclofenac  Sodium (VOLTAREN ) 1 % GEL Apply 2 g topically daily.     Diclofenac  Sodium CR 100 MG 24 hr tablet Take 1  tablet (100 mg total) by mouth daily. 90 tablet 1   ezetimibe  (ZETIA ) 10 MG tablet Take 1 tablet by mouth once daily 90 tablet 3   fluticasone  (FLONASE ) 50 MCG/ACT nasal spray Place 2 sprays into both nostrils in the morning and at bedtime. USE 2 SPRAY(S) IN EACH NOSTRIL ONCE DAILY 16 g 12   furosemide  (LASIX ) 40 MG tablet Take 1 tablet by mouth once daily 90 tablet 2   levocetirizine (XYZAL ) 5 MG tablet Take 1 tablet (5 mg total) by mouth every evening. 30 tablet 2   lisinopril  (ZESTRIL ) 20 MG tablet Take 1 tablet by mouth once daily 90 tablet 0   Melatonin 10 MG TABS Take 10 mg by mouth at bedtime.      methocarbamol  (ROBAXIN ) 500 MG tablet Take 1 tablet (500 mg total) by mouth 2 (two) times daily as needed. 60 tablet 2   metoprolol  tartrate (LOPRESSOR ) 50 MG tablet Take 1 tablet (50 mg total) by mouth 2 (two) times daily. 180 tablet 3   Multiple Vitamin (ONE-A-DAY 55 PLUS PO) Take 1 tablet by mouth daily. Men     omeprazole  (PRILOSEC) 20 MG capsule Take 1 capsule by mouth twice daily 180 capsule 0   potassium chloride  SA (KLOR-CON  M) 20 MEQ tablet Take 1 tablet by mouth once daily 60 tablet 3   rosuvastatin  (CRESTOR ) 40 MG tablet Take 1 tablet by mouth once daily 90 tablet 0   sildenafil  (VIAGRA ) 100 MG tablet Take 1 tablet (100 mg total) by mouth as needed for erectile dysfunction. 10 tablet 11   solifenacin  (VESICARE ) 5 MG tablet Take 1 tablet (5 mg total) by mouth daily. 90 tablet 3   tadalafil  (CIALIS ) 20 MG tablet Take 20 mg by mouth as needed.     terazosin  (HYTRIN ) 5 MG capsule Take 5 mg by mouth at bedtime.      Testosterone  20.25 MG/1.25GM (1.62%) GEL Apply 2 Packages topically daily. On each shoulder     Tiotropium Bromide-Olodaterol (STIOLTO RESPIMAT ) 2.5-2.5 MCG/ACT AERS Inhale 2 puffs into the lungs daily. 60 each 6   tirzepatide  (MOUNJARO ) 12.5 MG/0.5ML Pen Inject 12.5 mg into the skin once a week. 6 mL 3   Turmeric 500 MG TABS Take 1,000 mg by mouth daily. ginger     No  current facility-administered medications on file prior to visit.

## 2024-07-12 ENCOUNTER — Ambulatory Visit: Payer: Self-pay | Admitting: Internal Medicine

## 2024-07-12 ENCOUNTER — Other Ambulatory Visit: Payer: Self-pay | Admitting: Internal Medicine

## 2024-07-12 DIAGNOSIS — R7989 Other specified abnormal findings of blood chemistry: Secondary | ICD-10-CM

## 2024-07-18 ENCOUNTER — Telehealth: Payer: Self-pay

## 2024-07-18 NOTE — Telephone Encounter (Signed)
 Copied from CRM [id number]. Topic: Clinical - Lab/Test Results >> Jul 18, 2024  4:41 PM Warren Weeks ORN wrote: Reason for CRM: pt would like to know if it is time for him to get an ultrasound/check on his artery and wants to confirm that someone can still call him regarding hepatitis results once completed since provider will no longer be active .

## 2024-07-18 NOTE — Telephone Encounter (Signed)
 Copied from CRM #[id number]. Topic: Appointments - Transfer of Care >> Jul 18, 2024  4:43 PM Alfonso ORN wrote: Pt is requesting to transfer FROM: norleen agent Pt is requesting to transfer TO: matthews, stephanie Reason for requested transfer: pcp no longer active

## 2024-07-18 NOTE — Telephone Encounter (Signed)
 Fortunately his last carotid testing aug 2025 did not show significant blockages,  No further imaging should be needed for now.  Please also ask pt to f/u in 4 wks to f/u with a NP in our office, as it often takes some time to get the ultrasound liver done.  thanks

## 2024-07-24 NOTE — Telephone Encounter (Signed)
 Called and left voicemail to let Pt know to call and schedule an follow up with NP in 4 weeks.

## 2024-07-26 ENCOUNTER — Ambulatory Visit
Admission: RE | Admit: 2024-07-26 | Discharge: 2024-07-26 | Disposition: A | Source: Ambulatory Visit | Attending: Internal Medicine | Admitting: Internal Medicine

## 2024-07-26 DIAGNOSIS — R7989 Other specified abnormal findings of blood chemistry: Secondary | ICD-10-CM

## 2024-07-27 ENCOUNTER — Ambulatory Visit: Payer: Self-pay | Admitting: Internal Medicine

## 2024-07-27 ENCOUNTER — Ambulatory Visit: Admitting: Nurse Practitioner

## 2024-07-27 VITALS — BP 122/70 | HR 74 | Temp 97.6°F | Ht 69.0 in | Wt 216.1 lb

## 2024-07-27 DIAGNOSIS — M79606 Pain in leg, unspecified: Secondary | ICD-10-CM

## 2024-07-27 DIAGNOSIS — R600 Localized edema: Secondary | ICD-10-CM

## 2024-07-27 DIAGNOSIS — D649 Anemia, unspecified: Secondary | ICD-10-CM | POA: Insufficient documentation

## 2024-07-27 DIAGNOSIS — R748 Abnormal levels of other serum enzymes: Secondary | ICD-10-CM | POA: Insufficient documentation

## 2024-07-27 LAB — CBC
HCT: 35.8 % — ABNORMAL LOW (ref 39.0–52.0)
Hemoglobin: 11.9 g/dL — ABNORMAL LOW (ref 13.0–17.0)
MCHC: 33.4 g/dL (ref 30.0–36.0)
MCV: 91.9 fl (ref 78.0–100.0)
Platelets: 130 10*3/uL — ABNORMAL LOW (ref 150.0–400.0)
RBC: 3.9 Mil/uL — ABNORMAL LOW (ref 4.22–5.81)
RDW: 14.4 % (ref 11.5–15.5)
WBC: 4.3 10*3/uL (ref 4.0–10.5)

## 2024-07-27 LAB — COMPREHENSIVE METABOLIC PANEL WITH GFR
ALT: 64 U/L — ABNORMAL HIGH (ref 3–53)
AST: 71 U/L — ABNORMAL HIGH (ref 5–37)
Albumin: 4.1 g/dL (ref 3.5–5.2)
Alkaline Phosphatase: 70 U/L (ref 39–117)
BUN: 11 mg/dL (ref 6–23)
CO2: 29 meq/L (ref 19–32)
Calcium: 9 mg/dL (ref 8.4–10.5)
Chloride: 107 meq/L (ref 96–112)
Creatinine, Ser: 0.83 mg/dL (ref 0.40–1.50)
GFR: 85.22 mL/min
Glucose, Bld: 91 mg/dL (ref 70–99)
Potassium: 3.6 meq/L (ref 3.5–5.1)
Sodium: 142 meq/L (ref 135–145)
Total Bilirubin: 0.5 mg/dL (ref 0.2–1.2)
Total Protein: 7.1 g/dL (ref 6.0–8.3)

## 2024-07-27 LAB — GAMMA GT: GGT: 46 U/L (ref 7–51)

## 2024-07-27 LAB — FERRITIN: Ferritin: 112.8 ng/mL (ref 22.0–322.0)

## 2024-07-27 LAB — FOLATE: Folate: >23.4 ng/mL

## 2024-07-27 LAB — VITAMIN B12: Vitamin B-12: 1322 pg/mL — ABNORMAL HIGH (ref 211–911)

## 2024-07-27 LAB — IRON: Iron: 83 ug/dL (ref 42–165)

## 2024-07-27 MED ORDER — LIDOCAINE 5 % EX PTCH: 1.0000 | MEDICATED_PATCH | CUTANEOUS | 0 refills | Status: AC

## 2024-07-27 NOTE — Assessment & Plan Note (Signed)
 Elevated liver enzymes with possible chronic liver disease Elevated liver enzymes with ultrasound suggestive of chronic liver disease (cirrhosis?). Further evaluation required. - Ordered hepatitis panel and additional liver function tests. - Referred to gastroenterologist for further evaluation and potential biopsy. - Advised utilizing topical treatments (voltaren /lidocaine  patch) and to limiting Tylenol  use to no more than 3000 mg per day. - Recommended cessation of alcohol consumption. - Hydrate with water and continue to follow diet of fruits, vegetables, whole grains

## 2024-07-27 NOTE — Assessment & Plan Note (Signed)
 Lower extremity edema Chronic pitting edema, stable, no pulmonary involvement. - Likely his chronic PVD. - No intermittent claudication.  - Continue furosemide  40 mg daily. - Advised leg elevation when at rest. - Monitor for increased fatigue or dyspnea, monitor daily weights. May consider ordering echocardiogram/an increase in diuretic dosage.

## 2024-07-27 NOTE — Assessment & Plan Note (Signed)
 Anemia Previous hemoglobin levels slightly low (12.2), possible iron deficiency anemia vs. Anemia of chronic disease (has diabetes) - Ordered iron studies to assess for iron deficiency anemia. - Will consider iron supplementation if iron levels are low. - Refer to GI to determine if colonoscopy is needed.

## 2024-07-27 NOTE — Assessment & Plan Note (Signed)
 Chronic musculoskeletal pain (knee, back, shoulder) Chronic pain in knee, back, and shoulder, managed with Tylenol  and Voltaren  gel. Pain worsens with activity. - Prescribed lidocaine  patches for back pain, to be used 12 hours on and 12 hours off. - Advised continued use of Voltaren  gel for knee and shoulder pain. - Encouraged reduction of Tylenol  use and consideration of alternative pain management strategies.

## 2024-07-27 NOTE — Progress Notes (Signed)
 "  Established Patient Office Visit  Subjective   Patient ID: Warren Weeks, male    DOB: 1945-10-04  Age: 79 y.o. MRN: [id number]  Chief Complaint  Patient presents with   Transitions Of Care    Re-establishing care, wanting labs to be done.    Discussed the use of AI scribe software for clinical note transcription with the patient, who gave verbal consent to proceed.  History of Present Illness Warren Weeks is a 79 year old male with hypertension and vascular disease who presents with leg and foot swelling.  Peripheral edema - History of PVD - Noticed slight increased swelling in lower extremities and 1lb weight gain over 2 weeks - No chest pain or shortness of breath - No new worsening/fatigue - Reports having not elevated legs and often sits up for prolonged periods. Does use compression stockings - Adheres to low salt diet with increased fruits and vegetables  Hepatic and hematologic abnormalities - Recent elevated liver enzymes on labs last month. Underwent u/s which showed possible signs of early cirrhosis. If cirrhosis is present he appears compensated.  - Here to have additional evaluation completed - Reports drinking alcohol on the weekends when watching football, but not daily. Uses tylenol  daily for treatment of joint pain.  Musculoskeletal pain - Knee and back pain managed with regular Tylenol , sometimes up to four times daily - Uses Voltaren  gel for additional pain relief      ROS: see HPI    Objective:     BP 122/70   Pulse 74   Temp 97.6 F (36.4 C) (Temporal)   Ht 5' 9 (1.753 m)   Wt 216 lb 2 oz (98 kg)   SpO2 96%   BMI 31.92 kg/m  BP Readings from Last 3 Encounters:  07/27/24 122/70  07/11/24 128/80  06/05/24 128/62   Wt Readings from Last 3 Encounters:  07/27/24 216 lb 2 oz (98 kg)  07/11/24 215 lb (97.5 kg)  06/05/24 209 lb (94.8 kg)      Physical Exam Vitals reviewed.  Constitutional:      Appearance: Normal appearance.  HENT:      Head: Normocephalic and atraumatic.  Cardiovascular:     Rate and Rhythm: Normal rate and regular rhythm.     Pulses:          Dorsalis pedis pulses are 2+ on the right side and 2+ on the left side.  Pulmonary:     Effort: Pulmonary effort is normal.     Breath sounds: Normal breath sounds.  Musculoskeletal:     Cervical back: Neck supple.     Right lower leg: 1+ Edema present.     Left lower leg: 1+ Edema present.  Skin:    General: Skin is warm and dry.  Neurological:     Mental Status: He is alert and oriented to person, place, and time.  Psychiatric:        Mood and Affect: Mood normal.        Behavior: Behavior normal.        Thought Content: Thought content normal.        Judgment: Judgment normal.      No results found for any visits on 07/27/24.  Last CBC Lab Results  Component Value Date   WBC 4.6 07/11/2024   HGB 12.2 (L) 07/11/2024   HCT 36.2 (L) 07/11/2024   MCV 90.6 07/11/2024   MCH 30.8 12/24/2023   RDW 14.2 07/11/2024   PLT 157.0  07/11/2024   Last metabolic panel Lab Results  Component Value Date   GLUCOSE 93 07/11/2024   NA 140 07/11/2024   K 4.2 07/11/2024   CL 107 07/11/2024   CO2 29 07/11/2024   BUN 12 07/11/2024   CREATININE 0.96 07/11/2024   GFR 76.99 07/11/2024   CALCIUM  8.9 07/11/2024   PROT 6.9 07/11/2024   ALBUMIN 4.0 07/11/2024   BILITOT 0.5 07/11/2024   ALKPHOS 76 07/11/2024   AST 70 (H) 07/11/2024   ALT 79 (H) 07/11/2024   ANIONGAP 12 12/24/2023      The ASCVD Risk score (Arnett DK, et al., 2019) failed to calculate for the following reasons:   The valid total cholesterol range is 130 to 320 mg/dL    Assessment & Plan:   Problem List Items Addressed This Visit       Other   Low back pain radiating to lower extremity   Chronic musculoskeletal pain (knee, back, shoulder) Chronic pain in knee, back, and shoulder, managed with Tylenol  and Voltaren  gel. Pain worsens with activity. - Prescribed lidocaine  patches for  back pain, to be used 12 hours on and 12 hours off. - Advised continued use of Voltaren  gel for knee and shoulder pain. - Encouraged reduction of Tylenol  use and consideration of alternative pain management strategies.      Relevant Medications   lidocaine  (LIDODERM ) 5 %   Peripheral edema   Lower extremity edema Chronic pitting edema, stable, no pulmonary involvement. - Likely his chronic PVD. - No intermittent claudication.  - Continue furosemide  40 mg daily. - Advised leg elevation when at rest. - Monitor for increased fatigue or dyspnea, monitor daily weights. May consider ordering echocardiogram/an increase in diuretic dosage.      Anemia   Anemia Previous hemoglobin levels slightly low (12.2), possible iron deficiency anemia vs. Anemia of chronic disease (has diabetes) - Ordered iron studies to assess for iron deficiency anemia. - Will consider iron supplementation if iron levels are low. - Refer to GI to determine if colonoscopy is needed.       Relevant Orders   Ambulatory referral to Gastroenterology   Elevated liver enzymes - Primary   Elevated liver enzymes with possible chronic liver disease Elevated liver enzymes with ultrasound suggestive of chronic liver disease (cirrhosis?). Further evaluation required. - Ordered hepatitis panel and additional liver function tests. - Referred to gastroenterologist for further evaluation and potential biopsy. - Advised utilizing topical treatments (voltaren /lidocaine  patch) and to limiting Tylenol  use to no more than 3000 mg per day. - Recommended cessation of alcohol consumption. - Hydrate with water and continue to follow diet of fruits, vegetables, whole grains      Relevant Orders   CBC   Comprehensive metabolic panel with GFR   HepB+HepC+HIV Panel   Ambulatory referral to Gastroenterology   Protime-INR   Iron   Ferritin   Gamma GT   Folate   Vitamin B12   Assessment and Plan Assessment & Plan Lower extremity  edema Chronic pitting edema, stable, no pulmonary involvement. - Likely his chronic PVD. - No intermittent claudication.  - Continue furosemide  40 mg daily. - Advised leg elevation when at rest. - Monitor for increased fatigue or dyspnea, monitor daily weights. May consider ordering echocardiogram/an increase in diuretic dosage.  Elevated liver enzymes with possible chronic liver disease Elevated liver enzymes with ultrasound suggestive of chronic liver disease (cirrhosis?). Further evaluation required. - Ordered hepatitis panel and additional liver function tests. - Referred to gastroenterologist for further  evaluation and potential biopsy. - Advised utilizing topical treatments (voltaren /lidocaine  patch) and to limiting Tylenol  use to no more than 3000 mg per day. - Recommended cessation of alcohol consumption. - Hydrate with water and continue to follow diet of fruits, vegetables, whole grains  Anemia Previous hemoglobin levels slightly low (12.2), possible iron deficiency anemia vs. Anemia of chronic disease (has diabetes) - Ordered iron studies to assess for iron deficiency anemia. - Will consider iron supplementation if iron levels are low. - Refer to GI to determine if colonoscopy is needed.   Chronic musculoskeletal pain (knee, back, shoulder) Chronic pain in knee, back, and shoulder, managed with Tylenol  and Voltaren  gel. Pain worsens with activity. - Prescribed lidocaine  patches for back pain, to be used 12 hours on and 12 hours off. - Advised continued use of Voltaren  gel for knee and shoulder pain. - Encouraged reduction of Tylenol  use and consideration of alternative pain management strategies.   Return in about 6 weeks (around 09/07/2024) for F/U with Juwana Thoreson.    Lauraine FORBES Pereyra, NP  "

## 2024-07-28 LAB — HEPB+HEPC+HIV PANEL
HIV Screen 4th Generation wRfx: NONREACTIVE
Hep B C IgM: NEGATIVE
Hep B Core Total Ab: NEGATIVE
Hep B E Ab: NONREACTIVE
Hep B E Ag: NEGATIVE
Hep B Surface Ab, Qual: NONREACTIVE
Hep C Virus Ab: NONREACTIVE
Hepatitis B Surface Ag: NEGATIVE

## 2024-08-08 ENCOUNTER — Ambulatory Visit

## 2024-08-09 ENCOUNTER — Ambulatory Visit: Payer: Medicare PPO

## 2024-08-10 ENCOUNTER — Ambulatory Visit

## 2024-09-07 ENCOUNTER — Ambulatory Visit: Admitting: Internal Medicine

## 2024-10-05 ENCOUNTER — Ambulatory Visit: Admitting: Nurse Practitioner

## 2025-01-04 ENCOUNTER — Encounter: Admitting: Family Medicine

## 2025-01-09 ENCOUNTER — Ambulatory Visit
